# Patient Record
Sex: Male | Born: 1950 | Race: Black or African American | Hispanic: No | Marital: Single | Smoking: Former smoker
Health system: Southern US, Community
[De-identification: ages and names within clinical notes are randomized; demographics above are authoritative.]

## PROBLEM LIST (undated history)

## (undated) DIAGNOSIS — K430 Incisional hernia with obstruction, without gangrene: Secondary | ICD-10-CM

## (undated) DIAGNOSIS — G4730 Sleep apnea, unspecified: Secondary | ICD-10-CM

## (undated) DIAGNOSIS — I509 Heart failure, unspecified: Secondary | ICD-10-CM

## (undated) DIAGNOSIS — M109 Gout, unspecified: Secondary | ICD-10-CM

## (undated) DIAGNOSIS — K219 Gastro-esophageal reflux disease without esophagitis: Secondary | ICD-10-CM

## (undated) DIAGNOSIS — J449 Chronic obstructive pulmonary disease, unspecified: Secondary | ICD-10-CM

## (undated) DIAGNOSIS — I420 Dilated cardiomyopathy: Secondary | ICD-10-CM

## (undated) DIAGNOSIS — N189 Chronic kidney disease, unspecified: Secondary | ICD-10-CM

## (undated) DIAGNOSIS — J9600 Acute respiratory failure, unspecified whether with hypoxia or hypercapnia: Secondary | ICD-10-CM

## (undated) DIAGNOSIS — Z860100 Personal history of colon polyps, unspecified: Secondary | ICD-10-CM

## (undated) DIAGNOSIS — I10 Essential (primary) hypertension: Secondary | ICD-10-CM

## (undated) DIAGNOSIS — G9340 Encephalopathy, unspecified: Secondary | ICD-10-CM

## (undated) DIAGNOSIS — M1990 Unspecified osteoarthritis, unspecified site: Secondary | ICD-10-CM

## (undated) DIAGNOSIS — R6520 Severe sepsis without septic shock: Secondary | ICD-10-CM

## (undated) DIAGNOSIS — K56600 Partial intestinal obstruction, unspecified as to cause: Secondary | ICD-10-CM

## (undated) DIAGNOSIS — K56609 Unspecified intestinal obstruction, unspecified as to partial versus complete obstruction: Principal | ICD-10-CM

## (undated) DIAGNOSIS — B009 Herpesviral infection, unspecified: Secondary | ICD-10-CM

## (undated) DIAGNOSIS — I4892 Unspecified atrial flutter: Secondary | ICD-10-CM

## (undated) DIAGNOSIS — J189 Pneumonia, unspecified organism: Secondary | ICD-10-CM

## (undated) DIAGNOSIS — E119 Type 2 diabetes mellitus without complications: Secondary | ICD-10-CM

## (undated) DIAGNOSIS — A419 Sepsis, unspecified organism: Secondary | ICD-10-CM

## (undated) DIAGNOSIS — Z8719 Personal history of other diseases of the digestive system: Secondary | ICD-10-CM

## (undated) DIAGNOSIS — N390 Urinary tract infection, site not specified: Secondary | ICD-10-CM

## (undated) DIAGNOSIS — B029 Zoster without complications: Secondary | ICD-10-CM

## (undated) DIAGNOSIS — Z9889 Other specified postprocedural states: Secondary | ICD-10-CM

## (undated) DIAGNOSIS — A0472 Enterocolitis due to Clostridium difficile, not specified as recurrent: Secondary | ICD-10-CM

## (undated) DIAGNOSIS — N179 Acute kidney failure, unspecified: Secondary | ICD-10-CM

## (undated) HISTORY — PX: LAPAROSCOPIC INCISIONAL / UMBILICAL / VENTRAL HERNIA REPAIR: SUR789

## (undated) HISTORY — DX: Unspecified intestinal obstruction, unspecified as to partial versus complete obstruction: K56.609

## (undated) HISTORY — DX: Acute respiratory failure, unspecified whether with hypoxia or hypercapnia: J96.00

## (undated) HISTORY — DX: Gastro-esophageal reflux disease without esophagitis: K21.9

## (undated) HISTORY — DX: Chronic obstructive pulmonary disease, unspecified: J44.9

## (undated) HISTORY — DX: Sepsis, unspecified organism: A41.9

## (undated) HISTORY — DX: Chronic kidney disease, unspecified: N18.9

## (undated) HISTORY — DX: Unspecified atrial flutter: I48.92

## (undated) HISTORY — PX: HERNIA REPAIR: SHX51

## (undated) HISTORY — DX: Severe sepsis without septic shock: R65.20

---

## 2003-07-10 ENCOUNTER — Inpatient Hospital Stay (HOSPITAL_COMMUNITY): Admission: EM | Admit: 2003-07-10 | Discharge: 2003-07-17 | Payer: Self-pay | Admitting: *Deleted

## 2012-09-10 ENCOUNTER — Inpatient Hospital Stay (HOSPITAL_COMMUNITY)
Admission: EM | Admit: 2012-09-10 | Discharge: 2012-09-27 | DRG: 853 | Disposition: A | Discharge status: Skilled Nursing Facility | Payer: Medicaid Other | Attending: Internal Medicine | Admitting: Internal Medicine

## 2012-09-10 ENCOUNTER — Emergency Department (HOSPITAL_COMMUNITY): Payer: Medicaid Other

## 2012-09-10 ENCOUNTER — Encounter (HOSPITAL_COMMUNITY): Payer: Self-pay | Admitting: Adult Health

## 2012-09-10 DIAGNOSIS — I4729 Other ventricular tachycardia: Secondary | ICD-10-CM | POA: Diagnosis not present

## 2012-09-10 DIAGNOSIS — R197 Diarrhea, unspecified: Secondary | ICD-10-CM | POA: Diagnosis not present

## 2012-09-10 DIAGNOSIS — E872 Acidosis, unspecified: Secondary | ICD-10-CM | POA: Diagnosis not present

## 2012-09-10 DIAGNOSIS — L539 Erythematous condition, unspecified: Secondary | ICD-10-CM | POA: Diagnosis not present

## 2012-09-10 DIAGNOSIS — I472 Ventricular tachycardia, unspecified: Secondary | ICD-10-CM | POA: Diagnosis not present

## 2012-09-10 DIAGNOSIS — E46 Unspecified protein-calorie malnutrition: Secondary | ICD-10-CM | POA: Diagnosis not present

## 2012-09-10 DIAGNOSIS — E662 Morbid (severe) obesity with alveolar hypoventilation: Secondary | ICD-10-CM | POA: Diagnosis present

## 2012-09-10 DIAGNOSIS — I509 Heart failure, unspecified: Secondary | ICD-10-CM | POA: Diagnosis present

## 2012-09-10 DIAGNOSIS — N189 Chronic kidney disease, unspecified: Secondary | ICD-10-CM | POA: Diagnosis present

## 2012-09-10 DIAGNOSIS — I5043 Acute on chronic combined systolic (congestive) and diastolic (congestive) heart failure: Secondary | ICD-10-CM | POA: Diagnosis present

## 2012-09-10 DIAGNOSIS — I10 Essential (primary) hypertension: Secondary | ICD-10-CM

## 2012-09-10 DIAGNOSIS — Z7982 Long term (current) use of aspirin: Secondary | ICD-10-CM

## 2012-09-10 DIAGNOSIS — IMO0002 Reserved for concepts with insufficient information to code with codable children: Secondary | ICD-10-CM | POA: Diagnosis not present

## 2012-09-10 DIAGNOSIS — I959 Hypotension, unspecified: Secondary | ICD-10-CM

## 2012-09-10 DIAGNOSIS — I5040 Unspecified combined systolic (congestive) and diastolic (congestive) heart failure: Secondary | ICD-10-CM

## 2012-09-10 DIAGNOSIS — L03116 Cellulitis of left lower limb: Secondary | ICD-10-CM

## 2012-09-10 DIAGNOSIS — M79609 Pain in unspecified limb: Secondary | ICD-10-CM

## 2012-09-10 DIAGNOSIS — L8992 Pressure ulcer of unspecified site, stage 2: Secondary | ICD-10-CM | POA: Diagnosis present

## 2012-09-10 DIAGNOSIS — I119 Hypertensive heart disease without heart failure: Secondary | ICD-10-CM | POA: Diagnosis present

## 2012-09-10 DIAGNOSIS — Z6834 Body mass index (BMI) 34.0-34.9, adult: Secondary | ICD-10-CM

## 2012-09-10 DIAGNOSIS — G4733 Obstructive sleep apnea (adult) (pediatric): Secondary | ICD-10-CM | POA: Diagnosis present

## 2012-09-10 DIAGNOSIS — L02419 Cutaneous abscess of limb, unspecified: Secondary | ICD-10-CM | POA: Diagnosis present

## 2012-09-10 DIAGNOSIS — R609 Edema, unspecified: Secondary | ICD-10-CM | POA: Diagnosis not present

## 2012-09-10 DIAGNOSIS — R748 Abnormal levels of other serum enzymes: Secondary | ICD-10-CM | POA: Diagnosis present

## 2012-09-10 DIAGNOSIS — A419 Sepsis, unspecified organism: Secondary | ICD-10-CM | POA: Diagnosis present

## 2012-09-10 DIAGNOSIS — D696 Thrombocytopenia, unspecified: Secondary | ICD-10-CM | POA: Diagnosis present

## 2012-09-10 DIAGNOSIS — R579 Shock, unspecified: Secondary | ICD-10-CM | POA: Diagnosis present

## 2012-09-10 DIAGNOSIS — E669 Obesity, unspecified: Secondary | ICD-10-CM | POA: Diagnosis present

## 2012-09-10 DIAGNOSIS — R0689 Other abnormalities of breathing: Secondary | ICD-10-CM | POA: Diagnosis present

## 2012-09-10 DIAGNOSIS — E870 Hyperosmolality and hypernatremia: Secondary | ICD-10-CM | POA: Diagnosis not present

## 2012-09-10 DIAGNOSIS — R0601 Orthopnea: Secondary | ICD-10-CM | POA: Diagnosis present

## 2012-09-10 DIAGNOSIS — N179 Acute kidney failure, unspecified: Secondary | ICD-10-CM | POA: Diagnosis present

## 2012-09-10 DIAGNOSIS — E119 Type 2 diabetes mellitus without complications: Secondary | ICD-10-CM | POA: Diagnosis present

## 2012-09-10 DIAGNOSIS — R570 Cardiogenic shock: Secondary | ICD-10-CM | POA: Diagnosis not present

## 2012-09-10 DIAGNOSIS — Z9119 Patient's noncompliance with other medical treatment and regimen: Secondary | ICD-10-CM

## 2012-09-10 DIAGNOSIS — G9340 Encephalopathy, unspecified: Secondary | ICD-10-CM | POA: Diagnosis not present

## 2012-09-10 DIAGNOSIS — J9601 Acute respiratory failure with hypoxia: Secondary | ICD-10-CM | POA: Diagnosis present

## 2012-09-10 DIAGNOSIS — M7989 Other specified soft tissue disorders: Secondary | ICD-10-CM

## 2012-09-10 DIAGNOSIS — Z91199 Patient's noncompliance with other medical treatment and regimen due to unspecified reason: Secondary | ICD-10-CM

## 2012-09-10 DIAGNOSIS — F17200 Nicotine dependence, unspecified, uncomplicated: Secondary | ICD-10-CM | POA: Diagnosis present

## 2012-09-10 DIAGNOSIS — Z8679 Personal history of other diseases of the circulatory system: Secondary | ICD-10-CM | POA: Diagnosis not present

## 2012-09-10 DIAGNOSIS — I428 Other cardiomyopathies: Secondary | ICD-10-CM | POA: Diagnosis present

## 2012-09-10 DIAGNOSIS — I4510 Unspecified right bundle-branch block: Secondary | ICD-10-CM | POA: Diagnosis present

## 2012-09-10 DIAGNOSIS — L03119 Cellulitis of unspecified part of limb: Secondary | ICD-10-CM | POA: Diagnosis present

## 2012-09-10 DIAGNOSIS — I129 Hypertensive chronic kidney disease with stage 1 through stage 4 chronic kidney disease, or unspecified chronic kidney disease: Secondary | ICD-10-CM | POA: Diagnosis present

## 2012-09-10 DIAGNOSIS — R600 Localized edema: Secondary | ICD-10-CM | POA: Diagnosis not present

## 2012-09-10 DIAGNOSIS — E876 Hypokalemia: Secondary | ICD-10-CM | POA: Diagnosis not present

## 2012-09-10 DIAGNOSIS — L89309 Pressure ulcer of unspecified buttock, unspecified stage: Secondary | ICD-10-CM | POA: Diagnosis present

## 2012-09-10 DIAGNOSIS — IMO0001 Reserved for inherently not codable concepts without codable children: Secondary | ICD-10-CM | POA: Diagnosis present

## 2012-09-10 DIAGNOSIS — J9600 Acute respiratory failure, unspecified whether with hypoxia or hypercapnia: Secondary | ICD-10-CM | POA: Diagnosis not present

## 2012-09-10 HISTORY — DX: Type 2 diabetes mellitus without complications: E11.9

## 2012-09-10 HISTORY — DX: Dilated cardiomyopathy: I42.0

## 2012-09-10 HISTORY — DX: Essential (primary) hypertension: I10

## 2012-09-10 HISTORY — DX: Heart failure, unspecified: I50.9

## 2012-09-10 LAB — CBC WITH DIFFERENTIAL/PLATELET
Basophils Relative: 0 % (ref 0–1)
Eosinophils Relative: 0 % (ref 0–5)
Lymphocytes Relative: 13 % (ref 12–46)
Monocytes Relative: 6 % (ref 3–12)
Neutrophils Relative %: 81 % — ABNORMAL HIGH (ref 43–77)
Platelets: 52 10*3/uL — ABNORMAL LOW (ref 150–400)
RBC: 6.23 MIL/uL — ABNORMAL HIGH (ref 4.22–5.81)

## 2012-09-10 LAB — BASIC METABOLIC PANEL
CO2: 28 mEq/L (ref 19–32)
Chloride: 97 mEq/L (ref 96–112)
GFR calc Af Amer: 68 mL/min — ABNORMAL LOW (ref >90)
Glucose, Bld: 107 mg/dL — ABNORMAL HIGH (ref 70–99)
Potassium: 3.7 mEq/L (ref 3.5–5.1)
Sodium: 140 mEq/L (ref 135–145)

## 2012-09-10 MED ORDER — TIOTROPIUM BROMIDE MONOHYDRATE 18 MCG IN CAPS
18.0000 ug | ORAL_CAPSULE | Freq: Every day | RESPIRATORY_TRACT | Status: DC
  Administered 2012-09-11: 18 ug via RESPIRATORY_TRACT
  Filled 2012-09-10: qty 5

## 2012-09-10 MED ORDER — CLINDAMYCIN PHOSPHATE 900 MG/50ML IV SOLN
900.0000 mg | Freq: Three times a day (TID) | INTRAVENOUS | Status: DC
  Administered 2012-09-11 – 2012-09-14 (×11): 900 mg via INTRAVENOUS
  Filled 2012-09-10 (×14): qty 50

## 2012-09-10 MED ORDER — FUROSEMIDE 10 MG/ML IJ SOLN
80.0000 mg | INTRAMUSCULAR | Status: AC
  Administered 2012-09-10: 80 mg via INTRAVENOUS
  Filled 2012-09-10: qty 8

## 2012-09-10 MED ORDER — ENOXAPARIN SODIUM 40 MG/0.4ML ~~LOC~~ SOLN
40.0000 mg | SUBCUTANEOUS | Status: DC
  Administered 2012-09-10: 40 mg via SUBCUTANEOUS
  Filled 2012-09-10 (×2): qty 0.4

## 2012-09-10 MED ORDER — HYDROMORPHONE HCL PF 1 MG/ML IJ SOLN: 1.0000 mg | INTRAMUSCULAR | Status: AC | PRN

## 2012-09-10 MED ORDER — LISINOPRIL 10 MG PO TABS
10.0000 mg | ORAL_TABLET | Freq: Every day | ORAL | Status: DC
  Administered 2012-09-10 – 2012-09-11 (×2): 10 mg via ORAL
  Filled 2012-09-10 (×2): qty 1

## 2012-09-10 MED ORDER — CLINDAMYCIN PHOSPHATE 900 MG/50ML IV SOLN
900.0000 mg | Freq: Once | INTRAVENOUS | Status: AC
  Administered 2012-09-10: 900 mg via INTRAVENOUS
  Filled 2012-09-10: qty 50

## 2012-09-10 MED ORDER — TORSEMIDE 100 MG PO TABS
100.0000 mg | ORAL_TABLET | Freq: Every day | ORAL | Status: DC
  Administered 2012-09-10 – 2012-09-11 (×2): 100 mg via ORAL
  Filled 2012-09-10 (×2): qty 1

## 2012-09-10 MED ORDER — CLINDAMYCIN PHOSPHATE 600 MG/50ML IV SOLN
600.0000 mg | Freq: Three times a day (TID) | INTRAVENOUS | Status: DC
  Filled 2012-09-10: qty 50

## 2012-09-10 MED ORDER — SODIUM CHLORIDE 0.9 % IJ SOLN: 3.0000 mL | Freq: Two times a day (BID) | INTRAMUSCULAR | Status: DC

## 2012-09-10 MED ORDER — HYDROCODONE-ACETAMINOPHEN 5-325 MG PO TABS
1.0000 | ORAL_TABLET | ORAL | Status: DC | PRN
  Administered 2012-09-11: 1 via ORAL
  Filled 2012-09-10: qty 1

## 2012-09-10 MED ORDER — ONDANSETRON HCL 4 MG/2ML IJ SOLN: 4.0000 mg | Freq: Four times a day (QID) | INTRAMUSCULAR | Status: DC | PRN

## 2012-09-10 MED ORDER — SODIUM CHLORIDE 0.9 % IV SOLN: 250.0000 mL | INTRAVENOUS | Status: DC | PRN

## 2012-09-10 MED ORDER — ONDANSETRON HCL 4 MG PO TABS: 4.0000 mg | ORAL_TABLET | Freq: Four times a day (QID) | ORAL | Status: DC | PRN

## 2012-09-10 MED ORDER — MOMETASONE FURO-FORMOTEROL FUM 100-5 MCG/ACT IN AERO
2.0000 | INHALATION_SPRAY | Freq: Every day | RESPIRATORY_TRACT | Status: DC
  Administered 2012-09-11: 2 via RESPIRATORY_TRACT
  Filled 2012-09-10: qty 8.8

## 2012-09-10 MED ORDER — ASPIRIN EC 81 MG PO TBEC
81.0000 mg | DELAYED_RELEASE_TABLET | Freq: Every day | ORAL | Status: DC
  Administered 2012-09-10 – 2012-09-11 (×2): 81 mg via ORAL
  Filled 2012-09-10 (×2): qty 1

## 2012-09-10 MED ORDER — POTASSIUM CHLORIDE CRYS ER 20 MEQ PO TBCR
40.0000 meq | EXTENDED_RELEASE_TABLET | Freq: Every day | ORAL | Status: DC
  Administered 2012-09-10 – 2012-09-11 (×2): 40 meq via ORAL
  Filled 2012-09-10 (×2): qty 2

## 2012-09-10 MED ORDER — ONDANSETRON HCL 4 MG/2ML IJ SOLN
4.0000 mg | Freq: Four times a day (QID) | INTRAMUSCULAR | Status: DC | PRN
  Administered 2012-09-11: 4 mg via INTRAVENOUS
  Filled 2012-09-10: qty 2

## 2012-09-10 MED ORDER — SODIUM CHLORIDE 0.9 % IJ SOLN: 3.0000 mL | INTRAMUSCULAR | Status: DC | PRN

## 2012-09-10 MED ORDER — CARVEDILOL 3.125 MG PO TABS
3.1250 mg | ORAL_TABLET | Freq: Two times a day (BID) | ORAL | Status: DC
  Administered 2012-09-11: 3.125 mg via ORAL
  Filled 2012-09-10 (×3): qty 1

## 2012-09-10 MED ORDER — SODIUM CHLORIDE 0.9 % IJ SOLN
3.0000 mL | Freq: Two times a day (BID) | INTRAMUSCULAR | Status: DC
  Administered 2012-09-11 – 2012-09-17 (×8): 3 mL via INTRAVENOUS

## 2012-09-10 MED ORDER — ONDANSETRON HCL 4 MG/2ML IJ SOLN: 4.0000 mg | Freq: Three times a day (TID) | INTRAMUSCULAR | Status: AC | PRN

## 2012-09-10 NOTE — ED Notes (Addendum)
Presents with left leg weeping edema, redness, hot to touch and pain. Pt reports that he has scraped his leg on his bed multiple times and the edema, redness and pain began Thursday and has progressively worsened. Denies fevers. Erythema and edema extends from hip to foot. Unable to palpate pedal pulse due to edema. Pt able to wiggle digits.

## 2012-09-10 NOTE — H&P (Signed)
PCP:   Pcp Not In System   Chief Complaint:  Left leg swollen and red and painful  HPI: 62 yo male h/o chf, htn, dm comes in with worsening redness, swelling and pain to left lower extremity.  Pt states that he just bought a new twin sized bed, which is too small from him and last Thursday when he was trying to get out of the bed he scraped his left anterior chin against the metal on the bed.  Within 24 hours the scraped area started to become red and this has worsened daily to the point that the redness is covered the whole lower part of the leg leg and up to his left thigh now.  It has been swelling also and starting to weep.  Very painful to walk.  Denies fevers.  No n/v.  No sob or cp.  His right leg is not swollen.  No recent abx use.    Review of Systems:  Positive and negative as per HPI otherwise all other systems are negative  Past Medical History: Past Medical History  Diagnosis Date  . Diabetes mellitus without complication   . CHF (congestive heart failure)   . Hypertension    History reviewed. No pertinent past surgical history.  Medications: Prior to Admission medications   Medication Sig Start Date End Date Taking? Authorizing Provider  aspirin EC 81 MG tablet Take 81 mg by mouth daily.   Yes Historical Provider, MD  carvedilol (COREG) 3.125 MG tablet Take 3.125 mg by mouth 2 (two) times daily with a meal.   Yes Historical Provider, MD  lisinopril (PRINIVIL,ZESTRIL) 10 MG tablet Take 10 mg by mouth daily.   Yes Historical Provider, MD  mometasone-formoterol (DULERA) 100-5 MCG/ACT AERO Inhale 2 puffs into the lungs daily.   Yes Historical Provider, MD  naproxen sodium (ANAPROX) 220 MG tablet Take 440 mg by mouth daily as needed. For pain   Yes Historical Provider, MD  potassium chloride SA (K-DUR,KLOR-CON) 20 MEQ tablet Take 40 mEq by mouth daily.   Yes Historical Provider, MD  tiotropium (SPIRIVA) 18 MCG inhalation capsule Place 18 mcg into inhaler and inhale daily.    Yes Historical Provider, MD  torsemide (DEMADEX) 100 MG tablet Take 100 mg by mouth daily.   Yes Historical Provider, MD    Allergies:  No Known Allergies  Social History:  reports that he has been smoking Cigarettes.  He has been smoking about 0.00 packs per day. He does not have any smokeless tobacco history on file. He reports that he does not drink alcohol or use illicit drugs.  Family History: History reviewed. No pertinent family history.  Physical Exam: Filed Vitals:   09/10/12 1704 09/10/12 1949  BP: 112/90 106/79  Pulse: 80 86  Temp: 97.6 F (36.4 C)   TempSrc: Oral   Resp: 18 18  Height: 6\' 1"  (1.854 m)   Weight: 122.925 kg (271 lb)   SpO2: 95% 97%   General appearance: alert, cooperative and no distress Head: Normocephalic, without obvious abnormality, atraumatic Eyes: negative Nose: Nares normal. Septum midline. Mucosa normal. No drainage or sinus tenderness. Neck: no JVD and supple, symmetrical, trachea midline Lungs: clear to auscultation bilaterally Heart: regular rate and rhythm, S1, S2 normal, no murmur, click, rub or gallop Abdomen: soft, non-tender; bowel sounds normal; no masses,  no organomegaly Extremities: edema 2+ edema to lle with erythema to most of lower leg up to left thigh c/w cellulitis with open wound to left anterior chin  area pulses intact area marked out with marker on admission Pulses: 2+ and symmetric Skin: Skin color, texture, turgor normal. No rashes or lesions other than changes to lle mentioned above Neurologic: Grossly normal   Labs on Admission:   Recent Labs  09/10/12 1710  NA 140  K 3.7  CL 97  CO2 28  GLUCOSE 107*  BUN 40*  CREATININE 1.29  CALCIUM 8.7    Recent Labs  09/10/12 1710  WBC 7.2  NEUTROABS 5.9  HGB 16.5  HCT 48.7  MCV 78.2  PLT 52*    Radiological Exams on Admission: Dg Chest 2 View  09/10/2012   *RADIOLOGY REPORT*  Clinical Data: Left leg edema  CHEST - 2 VIEW  Comparison: 07/10/2003   Findings: Marked cardiac enlargement.  Vascular congestion with mild edema.  Left lower lobe atelectasis.  Small pleural effusions.  IMPRESSION: Congestive heart failure with mild edema.   Original Report Authenticated By: Janeece Riggers, M.D.    Assessment/Plan 62 yo male with significant lle cellulitis/swelling  Principal Problem:   Cellulitis of extremity Active Problems:   CHF (congestive heart failure)   Hypertension   Diabetes mellitus without complication  Place on iv clinda.  Elevate leg.  Ck tetanus status.  chf is clinically compensated at this time.  Admit to tele full code.  Teja Judice A 09/10/2012, 8:26 PM

## 2012-09-10 NOTE — Progress Notes (Signed)
Pt admitted to 4E23 from ER came by stretcher, no family member at the bedside. Pt c/o leg tenderness related to redness and swollen of left leg. VSS, denies any pain, SOB or discomfort at this time, no distress noticed. Pt refuses to have CHF video presented today. CHF education started pt oriented about HF zone, booklet given, pt oriented of daily weight and intake and output monitor. Pt encouraged to call for assistance to get OOB to prevent any fall or injury while in the hospital.

## 2012-09-10 NOTE — ED Provider Notes (Signed)
History    CSN: [id number] Arrival date & time 09/10/12  1651  First MD Initiated Contact with Patient 09/10/12 1814     Chief Complaint  Patient presents with  . Leg Swelling   (Consider location/radiation/quality/duration/timing/severity/associated sxs/prior Treatment) HPI Comments: Patient is a 62 year old male with a past medical history of diabetes, CHF, and hypertension who presents with a 2 day history of left leg pain. The pain started gradually and progressively worsened since the onset. The pain is aching and severe with radiation up to his left hip. Palpation and movement makes the pain worse. No alleviating factors. Patient reports associated redness, weeping and hot to touch. Patient did not try anything for symptoms. Patient just moved here from [facility] and does not see any physicians in [place].   Past Medical History  Diagnosis Date  . Diabetes mellitus without complication   . CHF (congestive heart failure)   . Hypertension    History reviewed. No pertinent past surgical history. History reviewed. No pertinent family history. History  Substance Use Topics  . Smoking status: Current Every Day Smoker    Types: Cigarettes  . Smokeless tobacco: Not on file  . Alcohol Use: No    Review of Systems  Cardiovascular: Positive for leg swelling.  Musculoskeletal: Positive for myalgias.  Skin: Positive for color change.  All other systems reviewed and are negative.    Allergies  Review of patient's allergies indicates not on file.  Home Medications  No current outpatient prescriptions on file. BP 112/90  Pulse 80  Temp(Src) 97.6 F (36.4 C) (Oral)  Resp 18  Ht 6\' 1"  (1.854 m)  Wt 271 lb (122.925 kg)  BMI 35.76 kg/m2  SpO2 95% Physical Exam  Nursing note and vitals reviewed. Constitutional: He is oriented to person, place, and time. He appears well-developed and well-nourished. No distress.  HENT:  Head: Normocephalic and atraumatic.  Eyes:  Conjunctivae are normal.  Neck: Normal range of motion.  Cardiovascular: Normal rate and regular rhythm.  Exam reveals no gallop and no friction rub.   No murmur heard. Pulmonary/Chest: Effort normal and breath sounds normal. He has no wheezes. He has no rales. He exhibits no tenderness.  Abdominal: Soft. He exhibits no distension. There is no tenderness. There is no rebound and no guarding.  Musculoskeletal: He exhibits edema.  Pitting edema of bilateral lower extremities L>R. Tenderness to palpation and warm to touch of left lower extremity. Erythema and weeping of left lower extremity.   Neurological: He is alert and oriented to person, place, and time. Coordination normal.  Speech is goal-oriented. Moves limbs without ataxia.   Skin: Skin is warm and dry.  Generalized erythema and weeping of left lower extremity.   Psychiatric: He has a normal mood and affect. His behavior is normal.    ED Course  Procedures (including critical care time) Labs Reviewed  CBC WITH DIFFERENTIAL - Abnormal; Notable for the following:    RBC 6.23 (*)    RDW 22.0 (*)    Platelets 52 (*)    Neutrophils Relative % 81 (*)    All other components within normal limits  BASIC METABOLIC PANEL - Abnormal; Notable for the following:    Glucose, Bld 107 (*)    BUN 40 (*)    GFR calc non Af Amer 58 (*)    GFR calc Af Amer 68 (*)    All other components within normal limits  PRO B NATRIURETIC PEPTIDE - Abnormal; Notable for the  following:    Pro B Natriuretic peptide (BNP) 23656.0 (*)    All other components within normal limits   Dg Chest 2 View  09/10/2012   *RADIOLOGY REPORT*  Clinical Data: Left leg edema  CHEST - 2 VIEW  Comparison: 07/10/2003  Findings: Marked cardiac enlargement.  Vascular congestion with mild edema.  Left lower lobe atelectasis.  Small pleural effusions.  IMPRESSION: Congestive heart failure with mild edema.   Original Report Authenticated By: Janeece Riggers, M.D.   1. CHF (congestive  heart failure), acute on chronic, combined   2. Cellulitis of left lower leg     MDM  6:29 PM Labs, chest xray and DVT study pending. Vitals stable and patient afebrile.  7:40 PM Patient has elevated BNP with vascular congestion noted on chest xray. Patient will have 80mg  IV lasix and be admitted for CHF and cellulitis of lower extremity. Patient will have IV clindamycin for cellulitis.   Emilia Beck, PA-C 09/10/12 2025

## 2012-09-10 NOTE — Progress Notes (Signed)
VASCULAR LAB PRELIMINARY  PRELIMINARY  PRELIMINARY  PRELIMINARY  Left lower extremity venous duplex completed.    Preliminary report:  Left:  No evidence of DVT, superficial thrombosis, or Baker's cyst. Enlargement of the inguinal lymph nodes noted  Brandice Busser, RVS 09/10/2012, 8:40 PM

## 2012-09-10 NOTE — Progress Notes (Signed)
ANTIBIOTIC CONSULT NOTE - INITIAL  Pharmacy Consult for Clindamycin Indication: Cellulitis  Allergies  Allergen Reactions  . Other Nausea And Vomiting    Patient stated drug is "kerein"    Patient Measurements: Height: 6\' 1"  (185.4 cm) Weight: 261 lb 7.5 oz (118.6 kg) (Bedscale, Pt unable to stand) IBW/kg (Calculated) : 79.9 Adjusted Body Weight:   Vital Signs: Temp: 97.3 F (36.3 C) (07/11 2145) Temp src: Oral (07/11 2145) BP: 89/67 mmHg (07/11 2145) Pulse Rate: 92 (07/11 2145) Intake/Output from previous day:   Intake/Output from this shift: Total I/O In: -  Out: 300 [Urine:300]  Labs:  Recent Labs  09/10/12 1710  WBC 7.2  HGB 16.5  PLT 52*  CREATININE 1.29   Estimated Creatinine Clearance: 81.1 ml/min (by C-G formula based on Cr of 1.29). No results found for this basename: VANCOTROUGH, VANCOPEAK, VANCORANDOM, GENTTROUGH, GENTPEAK, GENTRANDOM, TOBRATROUGH, TOBRAPEAK, TOBRARND, AMIKACINPEAK, AMIKACINTROU, AMIKACIN,  in the last 72 hours   Microbiology: No results found for this or any previous visit (from the past 720 hour(s)).  Medical History: Past Medical History  Diagnosis Date  . Diabetes mellitus without complication   . CHF (congestive heart failure)   . Hypertension     Medications:  Scheduled:  . aspirin EC  81 mg Oral Daily  . [START ON 09/11/2012] carvedilol  3.125 mg Oral BID WC  . [START ON 09/11/2012] clindamycin (CLEOCIN) IV  600 mg Intravenous Q8H  . enoxaparin (LOVENOX) injection  40 mg Subcutaneous Q24H  . lisinopril  10 mg Oral Daily  . mometasone-formoterol  2 puff Inhalation Daily  . potassium chloride SA  40 mEq Oral Daily  . sodium chloride  3 mL Intravenous Q12H  . sodium chloride  3 mL Intravenous Q12H  . tiotropium  18 mcg Inhalation Daily  . torsemide  100 mg Oral Daily   Assessment: 62yo male with cellulitis of LLE after scraping it on metal bed.  Current dose of CLindamycin is 900mg  IV x 1 in ED, then 600mg  IV  q8  Goal of Therapy:  resolution of infectio  Plan:  Change Clindamycin to 900mg  IV q8   Marisue Humble, PharmD Clinical Pharmacist [facility]

## 2012-09-10 NOTE — ED Notes (Signed)
Admitting MD at bedside.

## 2012-09-11 DIAGNOSIS — D696 Thrombocytopenia, unspecified: Secondary | ICD-10-CM

## 2012-09-11 DIAGNOSIS — I509 Heart failure, unspecified: Secondary | ICD-10-CM | POA: Insufficient documentation

## 2012-09-11 DIAGNOSIS — I959 Hypotension, unspecified: Secondary | ICD-10-CM

## 2012-09-11 DIAGNOSIS — R197 Diarrhea, unspecified: Secondary | ICD-10-CM

## 2012-09-11 DIAGNOSIS — N179 Acute kidney failure, unspecified: Secondary | ICD-10-CM

## 2012-09-11 LAB — MAGNESIUM: Magnesium: 2 mg/dL (ref 1.5–2.5)

## 2012-09-11 LAB — CBC
Hemoglobin: 15.5 g/dL (ref 13.0–17.0)
Platelets: 46 10*3/uL — ABNORMAL LOW (ref 150–400)
Platelets: 48 10*3/uL — ABNORMAL LOW (ref 150–400)
RBC: 5.69 MIL/uL (ref 4.22–5.81)
RBC: 6.05 MIL/uL — ABNORMAL HIGH (ref 4.22–5.81)
WBC: 7.1 10*3/uL (ref 4.0–10.5)
WBC: 7.7 10*3/uL (ref 4.0–10.5)

## 2012-09-11 LAB — POCT I-STAT 3, ART BLOOD GAS (G3+)
Acid-Base Excess: 3 mmol/L — ABNORMAL HIGH (ref 0.0–2.0)
Bicarbonate: 33.2 mEq/L — ABNORMAL HIGH (ref 20.0–24.0)
O2 Saturation: 95 %
Patient temperature: 97.5
TCO2: 35 mmol/L (ref 0–100)
pH, Arterial: 7.28 — ABNORMAL LOW (ref 7.350–7.450)

## 2012-09-11 LAB — PROCALCITONIN: Procalcitonin: 2.56 ng/mL

## 2012-09-11 LAB — GLUCOSE, CAPILLARY
Glucose-Capillary: 117 mg/dL — ABNORMAL HIGH (ref 70–99)
Glucose-Capillary: 89 mg/dL (ref 70–99)

## 2012-09-11 LAB — BASIC METABOLIC PANEL
Calcium: 8.7 mg/dL (ref 8.4–10.5)
GFR calc Af Amer: 60 mL/min — ABNORMAL LOW (ref >90)
GFR calc non Af Amer: 51 mL/min — ABNORMAL LOW (ref >90)
Potassium: 3.8 mEq/L (ref 3.5–5.1)
Sodium: 141 mEq/L (ref 135–145)

## 2012-09-11 LAB — CREATININE, SERUM
Creatinine, Ser: 1.26 mg/dL (ref 0.50–1.35)
GFR calc Af Amer: 69 mL/min — ABNORMAL LOW (ref >90)

## 2012-09-11 MED ORDER — DOPAMINE-DEXTROSE 3.2-5 MG/ML-% IV SOLN
2.0000 ug/kg/min | INTRAVENOUS | Status: DC
  Administered 2012-09-11: 5 ug/kg/min via INTRAVENOUS

## 2012-09-11 MED ORDER — SODIUM CHLORIDE 0.9 % IV BOLUS (SEPSIS)
500.0000 mL | Freq: Once | INTRAVENOUS | Status: AC
  Administered 2012-09-11: 500 mL via INTRAVENOUS

## 2012-09-11 MED ORDER — VANCOMYCIN HCL 10 G IV SOLR
1250.0000 mg | Freq: Two times a day (BID) | INTRAVENOUS | Status: DC
  Administered 2012-09-12 – 2012-09-14 (×5): 1250 mg via INTRAVENOUS
  Filled 2012-09-11 (×6): qty 1250

## 2012-09-11 MED ORDER — DOPAMINE-DEXTROSE 3.2-5 MG/ML-% IV SOLN
INTRAVENOUS | Status: AC
  Filled 2012-09-11: qty 250

## 2012-09-11 MED ORDER — SODIUM CHLORIDE 0.9 % IV BOLUS (SEPSIS)
250.0000 mL | Freq: Once | INTRAVENOUS | Status: AC
  Administered 2012-09-11: 250 mL via INTRAVENOUS

## 2012-09-11 MED ORDER — VANCOMYCIN HCL 10 G IV SOLR
2500.0000 mg | INTRAVENOUS | Status: AC
  Administered 2012-09-11: 2500 mg via INTRAVENOUS
  Filled 2012-09-11: qty 2500

## 2012-09-11 MED ORDER — LOPERAMIDE HCL 2 MG PO CAPS
2.0000 mg | ORAL_CAPSULE | ORAL | Status: DC | PRN
  Administered 2012-09-11 (×2): 2 mg via ORAL
  Filled 2012-09-11 (×3): qty 1

## 2012-09-11 MED ORDER — SODIUM CHLORIDE 0.9 % IV SOLN: 250.0000 mL | INTRAVENOUS | Status: DC | PRN

## 2012-09-11 NOTE — Progress Notes (Signed)
Dr. Kendrick Fries aware of worsening mental status with pt. on Bipap, Anesthesia team notified for intubation per Dr. Kendrick Fries.  Corliss Skains A RN

## 2012-09-11 NOTE — Progress Notes (Signed)
eLink Physician-Brief Progress Note Patient Name: Caleb Bauer DOB: [birth year] MRN: [id number]  Date of Service  09/11/2012   HPI/Events of Note  Difficulty obtaining non invasive bp and O2 sat  eICU Interventions  resp to insert art line and obtain abg   Intervention Category Intermediate Interventions: Hypotension - evaluation and management  GIDDINGS, OLIVIA K. 09/11/2012, 10:24 PM

## 2012-09-11 NOTE — Progress Notes (Signed)
Bp still low. 250CC bolus given as per dr Donna Bernard with slight increse in bp after i hr

## 2012-09-11 NOTE — Consult Note (Signed)
Reason for Consult:left leg swelling/bruising Referring Physician: TRH  HPI: Caleb Bauer is an 62 y.o. male admitted with presumptive LLE cellulitis after hitting leg against his bed last Thursday (9 days ago), noted swelling and redness with progressive worsening. Denies hip or thigh trauma. Hx CHF.   Past Medical History  Diagnosis Date  . Diabetes mellitus without complication   . CHF (congestive heart failure)   . Hypertension     History reviewed. No pertinent past surgical history.  History reviewed. No pertinent family history.  Social History:  reports that he has been smoking Cigarettes.  He has a .125 pack-year smoking history. He does not have any smokeless tobacco history on file. He reports that he does not drink alcohol or use illicit drugs.  Allergies:  Allergies  Allergen Reactions  . Other Nausea And Vomiting    Patient stated drug is "kerein"    Medications: meds reviewed, on ASA but no other chronic anticoagulant. On lovenox since admission.  Results for orders placed during the hospital encounter of 09/10/12 (from the past 48 hour(s))  CBC WITH DIFFERENTIAL     Status: Abnormal   Collection Time    09/10/12  5:10 PM      Result Value Range   WBC 7.2  4.0 - 10.5 K/uL   RBC 6.23 (*) 4.22 - 5.81 MIL/uL   Hemoglobin 16.5  13.0 - 17.0 g/dL   HCT 91.4  78.2 - 95.6 %   MCV 78.2  78.0 - 100.0 fL   MCH 26.5  26.0 - 34.0 pg   MCHC 33.9  30.0 - 36.0 g/dL   RDW 21.3 (*) 08.6 - 57.8 %   Platelets 52 (*) 150 - 400 K/uL   Comment: SPECIMEN CHECKED FOR CLOTS     REPEATED TO VERIFY     PLATELET COUNT CONFIRMED BY SMEAR     LARGE PLATELETS PRESENT   Neutrophils Relative % 81 (*) 43 - 77 %   Lymphocytes Relative 13  12 - 46 %   Monocytes Relative 6  3 - 12 %   Eosinophils Relative 0  0 - 5 %   Basophils Relative 0  0 - 1 %   Neutro Abs 5.9  1.7 - 7.7 K/uL   Lymphs Abs 0.9  0.7 - 4.0 K/uL   Monocytes Absolute 0.4  0.1 - 1.0 K/uL   Eosinophils Absolute 0.0  0.0  - 0.7 K/uL   Basophils Absolute 0.0  0.0 - 0.1 K/uL   RBC Morphology POLYCHROMASIA PRESENT     Comment: BURR CELLS  BASIC METABOLIC PANEL     Status: Abnormal   Collection Time    09/10/12  5:10 PM      Result Value Range   Sodium 140  135 - 145 mEq/L   Potassium 3.7  3.5 - 5.1 mEq/L   Chloride 97  96 - 112 mEq/L   CO2 28  19 - 32 mEq/L   Glucose, Bld 107 (*) 70 - 99 mg/dL   BUN 40 (*) 6 - 23 mg/dL   Creatinine, Ser 4.69  0.50 - 1.35 mg/dL   Calcium 8.7  8.4 - 62.9 mg/dL   GFR calc non Af Amer 58 (*) >90 mL/min   GFR calc Af Amer 68 (*) >90 mL/min   Comment:            The eGFR has been calculated     using the CKD EPI equation.     This calculation has  not been     validated in all clinical     situations.     eGFR's persistently     <90 mL/min signify     possible Chronic Kidney Disease.  PRO B NATRIURETIC PEPTIDE     Status: Abnormal   Collection Time    09/10/12  5:10 PM      Result Value Range   Pro B Natriuretic peptide (BNP) 23656.0 (*) 0 - 125 pg/mL  GLUCOSE, CAPILLARY     Status: Abnormal   Collection Time    09/10/12 10:10 PM      Result Value Range   Glucose-Capillary 117 (*) 70 - 99 mg/dL  CBC     Status: Abnormal   Collection Time    09/10/12 11:52 PM      Result Value Range   WBC 7.1  4.0 - 10.5 K/uL   RBC 6.05 (*) 4.22 - 5.81 MIL/uL   Hemoglobin 15.5  13.0 - 17.0 g/dL   HCT 40.9  81.1 - 91.4 %   MCV 78.8  78.0 - 100.0 fL   MCH 25.6 (*) 26.0 - 34.0 pg   MCHC 32.5  30.0 - 36.0 g/dL   RDW 78.2 (*) 95.6 - 21.3 %   Platelets 46 (*) 150 - 400 K/uL   Comment: PLATELET COUNT CONFIRMED BY SMEAR  CREATININE, SERUM     Status: Abnormal   Collection Time    09/10/12 11:52 PM      Result Value Range   Creatinine, Ser 1.26  0.50 - 1.35 mg/dL   GFR calc non Af Amer 60 (*) >90 mL/min   GFR calc Af Amer 69 (*) >90 mL/min   Comment:            The eGFR has been calculated     using the CKD EPI equation.     This calculation has not been     validated in all  clinical     situations.     eGFR's persistently     <90 mL/min signify     possible Chronic Kidney Disease.  BASIC METABOLIC PANEL     Status: Abnormal   Collection Time    09/11/12  5:10 AM      Result Value Range   Sodium 141  135 - 145 mEq/L   Potassium 3.8  3.5 - 5.1 mEq/L   Chloride 97  96 - 112 mEq/L   CO2 30  19 - 32 mEq/L   Glucose, Bld 120 (*) 70 - 99 mg/dL   BUN 43 (*) 6 - 23 mg/dL   Creatinine, Ser 0.86 (*) 0.50 - 1.35 mg/dL   Calcium 8.7  8.4 - 57.8 mg/dL   GFR calc non Af Amer 51 (*) >90 mL/min   GFR calc Af Amer 60 (*) >90 mL/min   Comment:            The eGFR has been calculated     using the CKD EPI equation.     This calculation has not been     validated in all clinical     situations.     eGFR's persistently     <90 mL/min signify     possible Chronic Kidney Disease.  CBC     Status: Abnormal   Collection Time    09/11/12  5:10 AM      Result Value Range   WBC 7.7  4.0 - 10.5 K/uL   RBC 5.69  4.22 - 5.81 MIL/uL  Hemoglobin 15.1  13.0 - 17.0 g/dL   HCT 30.8  65.7 - 84.6 %   MCV 78.0  78.0 - 100.0 fL   MCH 26.5  26.0 - 34.0 pg   MCHC 34.0  30.0 - 36.0 g/dL   RDW 96.2 (*) 95.2 - 84.1 %   Platelets 48 (*) 150 - 400 K/uL   Comment: CONSISTENT WITH PREVIOUS RESULT  MAGNESIUM     Status: None   Collection Time    09/11/12  5:45 AM      Result Value Range   Magnesium 2.0  1.5 - 2.5 mg/dL  GLUCOSE, CAPILLARY     Status: Abnormal   Collection Time    09/11/12  6:41 AM      Result Value Range   Glucose-Capillary 110 (*) 70 - 99 mg/dL   Comment 1 Notify RN    CLOSTRIDIUM DIFFICILE BY PCR     Status: None   Collection Time    09/11/12 11:13 AM      Result Value Range   C difficile by pcr NEGATIVE  NEGATIVE    Dg Chest 2 View  09/10/2012   *RADIOLOGY REPORT*  Clinical Data: Left leg edema  CHEST - 2 VIEW  Comparison: 07/10/2003  Findings: Marked cardiac enlargement.  Vascular congestion with mild edema.  Left lower lobe atelectasis.  Small pleural  effusions.  IMPRESSION: Congestive heart failure with mild edema.   Original Report Authenticated By: Janeece Riggers, M.D.     Vitals Temp:  [97.3 F (36.3 C)-97.8 F (36.6 C)] 97.8 F (36.6 C) (07/12 1355) Pulse Rate:  [67-92] 67 (07/12 1355) Resp:  [18] 18 (07/12 1355) BP: (77-106)/(50-79) 86/58 mmHg (07/12 1652) SpO2:  [92 %-97 %] 93 % (07/12 1355) Weight:  [118.6 kg (261 lb 7.5 oz)-122.6 kg (270 lb 4.5 oz)] 122.6 kg (270 lb 4.5 oz) (07/12 0534) Body mass index is 35.67 kg/(m^2).  Physical Exam: Obese BM, somnolent arousable and minimally cooperative. 3+ edema LLE, 1+ RLE. 1+ DP pulse LLE. Circumferential ecchymosis left leg knee to ankle, superficial skin creases returning suggesting overall swelling is improving. No fluctuance or drainage. Compartments soft, dorsi and plantar flexors intact with minimal pain on passive motion. Broad ecchymosis left anterior thigh and hip. Thigh compartment soft. Fair effort with assisted SLR.  Doppler LLE no DVT, enlarged left inguinal lymph nodes, other changes consistent with fluid overload.     Assessment/Plan: Impression:  Marked ecchymosis and swelling LLE. Current exam dose not suggest acute compartment syndrome. Additional Ecchymosis hip and thigh in face of thrombocytopenia concerning for coagulopathy.  Treatment: discussed with Dr. Donna Bernard. Encouraged calf pumps, elevation and other edema reduction techniques.  Aedin Jeansonne M 09/11/2012, 5:36 PM

## 2012-09-11 NOTE — Progress Notes (Signed)
Bp 75/57 after second 250cc bolus. Dr Donna Bernard made aware. Awaiting orders

## 2012-09-11 NOTE — Procedures (Signed)
Arterial Catheter Insertion Procedure Note Caleb Bauer [id number] [birth year]  Procedure: Insertion of Arterial Catheter  Indications: Blood pressure monitoring  Procedure Details Consent: Unable to obtain consent because of altered level of consciousness. Time Out: Verified patient identification, verified procedure, site/side was marked, verified correct patient position, special equipment/implants available, medications/allergies/relevent history reviewed, required imaging and test results available.  Performed  Maximum sterile technique was used including cap, gloves, gown, hand hygiene, mask and sheet. Skin prep: Chlorhexidine; local anesthetic administered 20 gauge catheter was inserted into right radial artery using the Seldinger technique.  Evaluation Blood flow good; BP tracing good. Complications: No apparent complications.  RT placed arterial catheter into pt. Right radial artery. Procedure was done per MD order and per protocol. No apparent complications.   Caleb Bauer 09/11/2012

## 2012-09-11 NOTE — Progress Notes (Signed)
eLink Physician-Brief Progress Note Patient Name: Caleb Bauer DOB: 10/05/1950 MRN: [id number]  Date of Service  09/11/2012   HPI/Events of Note   Worsening mental status despite BIPAP; critically ill  eICU Interventions  Needs intubation Called anesthesia as fellow at [facility], Darl Kuss 09/11/2012, 11:39 PM

## 2012-09-11 NOTE — Progress Notes (Signed)
eLink Physician-Brief Progress Note Patient Name: Caleb Bauer DOB: February 10, 1951 MRN: [id number]  Date of Service  09/11/2012   HPI/Events of Note   Pt with chf and cellulitis, hypotensive, moved from 4east  eICU Interventions  Persistent hypotension despite fluid bolus, lactate 4, procalcitonin 2.6. Started peripheral dopamine pending central line placement.    Intervention Category Major Interventions: Hypotension - evaluation and management Intermediate Interventions: Hypotension - evaluation and management  GIDDINGS, OLIVIA K. 09/11/2012, 10:25 PM

## 2012-09-11 NOTE — Progress Notes (Signed)
Patient became lethargic, Sat's in the 88-89 Spo2 with NRB. Dr. Belinda Block notified ABG ordered and Aline placement ordered. Will continue to monitor.  Corliss Skains RN

## 2012-09-11 NOTE — Progress Notes (Addendum)
[facility] PROGRESS NOTE  Caleb Bauer BJY:[id number] DOB: 1950-05-18 DOA: 09/10/2012 PCP: Pcp Not In System ADDENDUM: 7:00PM . Hypotension -After completing second 250cc bolus BP is 77/57 per nsg -all antihypertensives dc'ed as below -obtain blood cultures, and add vanc -His BNP is >23000, I think he will likely need vasopressors -I have consulted CCM- spoke with Dr Pollie Friar and she recommends giving another 500cc of IVF, obtaining procalcitonin as well and agrees with lactic level and transfer to ICU-2900  .Discussed pt with ortho- Dr Rennis Chris and he states he likely does not have compartment syndrome    Assessment/Plan: Present on Admission:  . Diarrhea -obtain C.diff studies and follow -likely abx associated . Presumed Cellulitis of extremity -? Compartment syndrome given circumferential distribution and h/o trauma - ortho consult for further recs -doppler US neg for DVT - continue empiric clindaamycin . Hypotension, unspecified -possibly secondary to diarrhea and torsemide +antihypertensive meds this am vs infection/sepsis due to above -will give 250 cc bolus recheck and repeat if still hypotensive- cautious given CHF history with BNP >23000 -hold further diuretics and antihypertensives and follow . CHF (congestive heart failure) -clinically appears compensated at [facility] time but BNP markedly elevated- no EF available on epic, follow -strict I&Os, close monitoring of fluid status -hold diurtetic for now secondary to diarrhea and hypotension, follow and resume when appropriate. . Diabetes mellitus without complication -on no meds, obtain Hgb A1C and follow . h/o Hypertension - holding lisinopril and coreg due to hypotension as above .Thrombocytopemia ? Secondary to infection - will D/C lovenox and ASA for now follow recheck and further treat accordingly -empiric abx as above   Code Status: full Family Communication: directly with pt at bedside Disposition Plan:  pending clinical course   Consultants:  Ortho  CCM eval pending  Procedures:  LE doppler - neg for DVT  Antibiotics:  clindamycin  HPI/Subjective: Pt sleepy but easily aroused. Denies CP and no SOB. Per nsg diarrhea x4 so far this am  Objective: Filed Vitals:   09/11/12 0843 09/11/12 1042 09/11/12 1355 09/11/12 1445  BP:  102/55 77/52 80/50   Pulse:   67   Temp:   97.8 F (36.6 C)   TempSrc:   Oral   Resp:   18   Height:      Weight:      SpO2: 95%  93%     Intake/Output Summary (Last 24 hours) at 09/11/12 1519 Last data filed at 09/11/12 1354  Gross per 24 hour  Intake   1182 ml  Output    700 ml  Net    482 ml   Filed Weights   09/10/12 1704 09/10/12 2145 09/11/12 0534  Weight: 122.925 kg (271 lb) 118.6 kg (261 lb 7.5 oz) 122.6 kg (270 lb 4.5 oz)   Exam:  General: sleepy but easily aroused and oriented x3 & oriented x  In NAD Cardiovascular: RRR, nl S1 s2 Respiratory: decreased BS at bases with no crackles or wheezes, resp non labored Abdomen: soft +BS NT/ND, no masses palpable Extremities:  LLE with circumferential ecchymosis from ankle up to knee and small open area anteromedially that is draining- serous, tender, +3 edema, also there bruising/ecchymotic area in upper thigh close to groin ; RLE with No cyanosis, trace edema    Data Reviewed: Basic Metabolic Panel:  Recent Labs Lab 09/10/12 1710 09/10/12 2352 09/11/12 0510 09/11/12 0545  NA 140  --  141  --   K 3.7  --  3.8  --  CL 97  --  97  --   CO2 28  --  30  --   GLUCOSE 107*  --  120*  --   BUN 40*  --  43*  --   CREATININE 1.29 1.26 1.43*  --   CALCIUM 8.7  --  8.7  --   MG  --   --   --  2.0   Liver Function Tests: No results found for this basename: AST, ALT, ALKPHOS, BILITOT, PROT, ALBUMIN,  in the last 168 hours No results found for this basename: LIPASE, AMYLASE,  in the last 168 hours No results found for this basename: AMMONIA,  in the last 168 hours CBC:  Recent  Labs Lab 09/10/12 1710 09/10/12 2352 09/11/12 0510  WBC 7.2 7.1 7.7  NEUTROABS 5.9  --   --   HGB 16.5 15.5 15.1  HCT 48.7 47.7 44.4  MCV 78.2 78.8 78.0  PLT 52* 46* 48*   Cardiac Enzymes: No results found for this basename: CKTOTAL, CKMB, CKMBINDEX, TROPONINI,  in the last 168 hours BNP (last 3 results)  Recent Labs  09/10/12 1710  PROBNP 23656.0*   CBG:  Recent Labs Lab 09/10/12 2210 09/11/12 0641  GLUCAP 117* 110*    Recent Results (from the past 240 hour(s))  CLOSTRIDIUM DIFFICILE BY PCR     Status: None   Collection Time    09/11/12 11:13 AM      Result Value Range Status   C difficile by pcr NEGATIVE  NEGATIVE Final     Studies: Dg Chest 2 View  09/10/2012   *RADIOLOGY REPORT*  Clinical Data: Left leg edema  CHEST - 2 VIEW  Comparison: 07/10/2003  Findings: Marked cardiac enlargement.  Vascular congestion with mild edema.  Left lower lobe atelectasis.  Small pleural effusions.  IMPRESSION: Congestive heart failure with mild edema.   Original Report Authenticated By: Janeece Riggers, M.D.    Scheduled Meds: . aspirin EC  81 mg Oral Daily  . carvedilol  3.125 mg Oral BID WC  . clindamycin (CLEOCIN) IV  900 mg Intravenous Q8H  . enoxaparin (LOVENOX) injection  40 mg Subcutaneous Q24H  . lisinopril  10 mg Oral Daily  . mometasone-formoterol  2 puff Inhalation Daily  . potassium chloride SA  40 mEq Oral Daily  . sodium chloride  3 mL Intravenous Q12H  . sodium chloride  3 mL Intravenous Q12H  . tiotropium  18 mcg Inhalation Daily  . torsemide  100 mg Oral Daily   Continuous Infusions:   Principal Problem:   Cellulitis of extremity Active Problems:   CHF (congestive heart failure)   Hypertension   Diabetes mellitus without complication    Time spent: critical care time     [facility]  [facility] Pager [phone]. If 7PM-7AM, please contact night-coverage at [email or web address], password [facility] 09/11/2012, 3:19 PM  LOS: 1 day

## 2012-09-11 NOTE — Progress Notes (Signed)
Report given to charge nurse on 2100.  Patient transferred with all belongings.

## 2012-09-11 NOTE — Progress Notes (Signed)
Pt had 16 beat run VT-asymptomatic. Dr Donna Bernard aware. Mag level checked. K+ 3.8

## 2012-09-11 NOTE — Progress Notes (Signed)
ANTIBIOTIC CONSULT NOTE - INITIAL  Pharmacy Consult for Vancomycin Indication: Cellulitis  Allergies  Allergen Reactions  . Other Nausea And Vomiting    Patient stated drug is "kerein"    Patient Measurements: Height: 6\' 1"  (185.4 cm) Weight: 270 lb 4.5 oz (122.6 kg) IBW/kg (Calculated) : 79.9 Adjusted Body Weight:    Vital Signs: Temp: 97.8 F (36.6 C) (07/12 1355) Temp src: Oral (07/12 1355) BP: 87/59 mmHg (07/12 1935) Pulse Rate: 71 (07/12 1935) Intake/Output from previous day: 07/11 0701 - 07/12 0700 In: 882 [P.O.:882] Out: 500 [Urine:500] Intake/Output from this shift:    Labs:  Recent Labs  09/10/12 1710 09/10/12 2352 09/11/12 0510  WBC 7.2 7.1 7.7  HGB 16.5 15.5 15.1  PLT 52* 46* 48*  CREATININE 1.29 1.26 1.43*   Estimated Creatinine Clearance: 74.4 ml/min (by C-G formula based on Cr of 1.43). No results found for this basename: VANCOTROUGH, Leodis Binet, VANCORANDOM, GENTTROUGH, GENTPEAK, GENTRANDOM, TOBRATROUGH, TOBRAPEAK, TOBRARND, AMIKACINPEAK, AMIKACINTROU, AMIKACIN,  in the last 72 hours   Microbiology: Recent Results (from the past 720 hour(s))  CLOSTRIDIUM DIFFICILE BY PCR     Status: None   Collection Time    09/11/12 11:13 AM      Result Value Range Status   C difficile by pcr NEGATIVE  NEGATIVE Final    Medical History: Past Medical History  Diagnosis Date  . Diabetes mellitus without complication   . CHF (congestive heart failure)   . Hypertension     Medications:  Prescriptions prior to admission  Medication Sig Dispense Refill  . aspirin EC 81 MG tablet Take 81 mg by mouth daily.      . carvedilol (COREG) 3.125 MG tablet Take 3.125 mg by mouth 2 (two) times daily with a meal.      . lisinopril (PRINIVIL,ZESTRIL) 10 MG tablet Take 10 mg by mouth daily.      . mometasone-formoterol (DULERA) 100-5 MCG/ACT AERO Inhale 2 puffs into the lungs daily.      . naproxen sodium (ANAPROX) 220 MG tablet Take 440 mg by mouth daily as needed. For  pain      . potassium chloride SA (K-DUR,KLOR-CON) 20 MEQ tablet Take 40 mEq by mouth daily.      Marland Kitchen tiotropium (SPIRIVA) 18 MCG inhalation capsule Place 18 mcg into inhaler and inhale daily.      Marland Kitchen torsemide (DEMADEX) 100 MG tablet Take 100 mg by mouth daily.       Assessment: worsening redness, swelling and pain to left lower extremity after scraping it on a metal bed. No DVT on dopplers.  PMH: CHF, HTN, DM  ID: Patient afebrile with WBC 7.7 but now hypotensive. Add Vancomycin to Clindamycin. Scr up to 1.43 with estimated CrCl 74 7/11 Clinda>> 7/12 Vanco>>   Goal of Therapy:  Vancomycin trough level 10-15 mcg/ml  Plan:  Vancomycin 2500mg  IV x 1 then 1250mg  IV q12 hrs. Trough after 3-5 doses at steady state.   Makena, Mcgrady Stillinger 09/11/2012,7:46 PM

## 2012-09-11 NOTE — Progress Notes (Signed)
Pt  Receiving second NS bolus 250cc as per Dr Donna Bernard. Rapid response informed of low bp. Will monitor for now

## 2012-09-11 NOTE — Progress Notes (Signed)
eLink Physician-Brief Progress Note Patient Name: Caleb Bauer DOB: June 17, 1950 MRN: [id number]  Date of Service  09/11/2012   HPI/Events of Note   Blood gas, pt with worsening mental status  eICU Interventions  bipap started at 12/8 resp to titrate.   Intervention Category Major Interventions: Hypoxemia - evaluation and management  GIDDINGS, OLIVIA K. 09/11/2012, 10:50 PM

## 2012-09-11 NOTE — Progress Notes (Signed)
Bp low- rechecked. Dr Donna Bernard made aware. Pt has had 5 loose stools this am, c-diff negative ,begun on Imodium. Md ordered to recheck in 1 hr

## 2012-09-12 ENCOUNTER — Inpatient Hospital Stay (HOSPITAL_COMMUNITY): Payer: Medicaid Other

## 2012-09-12 ENCOUNTER — Encounter (HOSPITAL_COMMUNITY): Payer: Self-pay | Admitting: Anesthesiology

## 2012-09-12 ENCOUNTER — Encounter (HOSPITAL_COMMUNITY): Payer: Self-pay | Admitting: General Surgery

## 2012-09-12 DIAGNOSIS — L02419 Cutaneous abscess of limb, unspecified: Secondary | ICD-10-CM

## 2012-09-12 DIAGNOSIS — J9601 Acute respiratory failure with hypoxia: Secondary | ICD-10-CM | POA: Diagnosis present

## 2012-09-12 DIAGNOSIS — J9600 Acute respiratory failure, unspecified whether with hypoxia or hypercapnia: Secondary | ICD-10-CM

## 2012-09-12 DIAGNOSIS — I509 Heart failure, unspecified: Secondary | ICD-10-CM

## 2012-09-12 DIAGNOSIS — L03119 Cellulitis of unspecified part of limb: Secondary | ICD-10-CM

## 2012-09-12 DIAGNOSIS — E119 Type 2 diabetes mellitus without complications: Secondary | ICD-10-CM

## 2012-09-12 DIAGNOSIS — I5043 Acute on chronic combined systolic (congestive) and diastolic (congestive) heart failure: Secondary | ICD-10-CM

## 2012-09-12 LAB — GLUCOSE, CAPILLARY
Glucose-Capillary: 147 mg/dL — ABNORMAL HIGH (ref 70–99)
Glucose-Capillary: 128 mg/dL — ABNORMAL HIGH (ref 70–99)
Glucose-Capillary: 114 mg/dL — ABNORMAL HIGH (ref 70–99)

## 2012-09-12 LAB — URINALYSIS, ROUTINE W REFLEX MICROSCOPIC
Bilirubin Urine: NEGATIVE
Ketones, ur: NEGATIVE mg/dL
Nitrite: NEGATIVE
Protein, ur: NEGATIVE mg/dL
Urobilinogen, UA: 1 mg/dL (ref 0.0–1.0)
pH: 5 (ref 5.0–8.0)

## 2012-09-12 LAB — URINE MICROSCOPIC-ADD ON

## 2012-09-12 LAB — CBC
Hemoglobin: 14.7 g/dL (ref 13.0–17.0)
MCV: 78.3 fL (ref 78.0–100.0)
Platelets: 39 10*3/uL — ABNORMAL LOW (ref 150–400)
RBC: 5.77 MIL/uL (ref 4.22–5.81)
WBC: 7.5 10*3/uL (ref 4.0–10.5)

## 2012-09-12 LAB — COMPREHENSIVE METABOLIC PANEL
ALT: 26 U/L (ref 0–53)
AST: 27 U/L (ref 0–37)
Alkaline Phosphatase: 72 U/L (ref 39–117)
CO2: 27 mEq/L (ref 19–32)
GFR calc Af Amer: 44 mL/min — ABNORMAL LOW (ref >90)
Glucose, Bld: 112 mg/dL — ABNORMAL HIGH (ref 70–99)
Potassium: 3.8 mEq/L (ref 3.5–5.1)
Sodium: 138 mEq/L (ref 135–145)
Total Protein: 5.5 g/dL — ABNORMAL LOW (ref 6.0–8.3)

## 2012-09-12 LAB — BASIC METABOLIC PANEL
BUN: 47 mg/dL — ABNORMAL HIGH (ref 6–23)
CO2: 30 mEq/L (ref 19–32)
Chloride: 101 mEq/L (ref 96–112)
Creatinine, Ser: 1.63 mg/dL — ABNORMAL HIGH (ref 0.50–1.35)
GFR calc Af Amer: 51 mL/min — ABNORMAL LOW (ref >90)
Glucose, Bld: 142 mg/dL — ABNORMAL HIGH (ref 70–99)
Potassium: 3.8 mEq/L (ref 3.5–5.1)

## 2012-09-12 LAB — POCT I-STAT 3, ART BLOOD GAS (G3+)
Bicarbonate: 31.9 mEq/L — ABNORMAL HIGH (ref 20.0–24.0)
O2 Saturation: 99 %
TCO2: 33 mmol/L (ref 0–100)
pCO2 arterial: 49.5 mmHg — ABNORMAL HIGH (ref 35.0–45.0)

## 2012-09-12 LAB — PROTIME-INR: INR: 3.06 — ABNORMAL HIGH (ref 0.00–1.49)

## 2012-09-12 LAB — LACTIC ACID, PLASMA: Lactic Acid, Venous: 1.9 mmol/L (ref 0.5–2.2)

## 2012-09-12 LAB — HEMOGLOBIN A1C: Hgb A1c MFr Bld: 6.2 % — ABNORMAL HIGH (ref <5.7)

## 2012-09-12 MED ORDER — PROPOFOL 10 MG/ML IV BOLUS
INTRAVENOUS | Status: DC | PRN
  Administered 2012-09-12: 40 mg via INTRAVENOUS

## 2012-09-12 MED ORDER — INSULIN ASPART 100 UNIT/ML ~~LOC~~ SOLN
2.0000 [IU] | SUBCUTANEOUS | Status: DC
Start: 2012-09-12 — End: 2012-09-15
  Administered 2012-09-12 – 2012-09-14 (×2): 2 [IU] via SUBCUTANEOUS

## 2012-09-12 MED ORDER — NOREPINEPHRINE BITARTRATE 1 MG/ML IJ SOLN
2.0000 ug/min | INTRAVENOUS | Status: DC
  Administered 2012-09-12: 10.987 ug/min via INTRAVENOUS
  Administered 2012-09-12 – 2012-09-13 (×2): 5 ug/min via INTRAVENOUS
  Administered 2012-09-15: 2 ug/min via INTRAVENOUS
  Filled 2012-09-12 (×5): qty 8

## 2012-09-12 MED ORDER — FENTANYL CITRATE 0.05 MG/ML IJ SOLN
50.0000 ug | INTRAMUSCULAR | Status: DC | PRN
  Administered 2012-09-12 (×2): 100 ug via INTRAVENOUS
  Filled 2012-09-12 (×4): qty 2

## 2012-09-12 MED ORDER — MIDAZOLAM HCL 2 MG/2ML IJ SOLN
2.0000 mg | INTRAMUSCULAR | Status: DC | PRN
  Administered 2012-09-12 (×2): 2 mg via INTRAVENOUS
  Filled 2012-09-12 (×4): qty 2

## 2012-09-12 MED ORDER — CHLORHEXIDINE GLUCONATE 0.12 % MT SOLN
15.0000 mL | Freq: Two times a day (BID) | OROMUCOSAL | Status: DC
  Administered 2012-09-12 – 2012-09-14 (×5): 15 mL via OROMUCOSAL
  Filled 2012-09-12: qty 15
  Filled 2012-09-12: qty 30
  Filled 2012-09-12 (×3): qty 15

## 2012-09-12 MED ORDER — ALBUTEROL SULFATE (5 MG/ML) 0.5% IN NEBU
2.5000 mg | INHALATION_SOLUTION | Freq: Four times a day (QID) | RESPIRATORY_TRACT | Status: DC
  Administered 2012-09-12 – 2012-09-18 (×25): 2.5 mg via RESPIRATORY_TRACT
  Filled 2012-09-12 (×26): qty 0.5

## 2012-09-12 MED ORDER — PIPERACILLIN-TAZOBACTAM 3.375 G IVPB 30 MIN
3.3750 g | Freq: Three times a day (TID) | INTRAVENOUS | Status: AC
  Administered 2012-09-12 – 2012-09-21 (×30): 3.375 g via INTRAVENOUS
  Filled 2012-09-12 (×30): qty 50

## 2012-09-12 MED ORDER — PANTOPRAZOLE SODIUM 40 MG IV SOLR
40.0000 mg | Freq: Every day | INTRAVENOUS | Status: DC
  Administered 2012-09-12 – 2012-09-14 (×3): 40 mg via INTRAVENOUS
  Filled 2012-09-12 (×4): qty 40

## 2012-09-12 MED ORDER — BIOTENE DRY MOUTH MT LIQD
15.0000 mL | Freq: Four times a day (QID) | OROMUCOSAL | Status: DC
  Administered 2012-09-12 – 2012-09-14 (×11): 15 mL via OROMUCOSAL

## 2012-09-12 MED ORDER — IPRATROPIUM BROMIDE 0.02 % IN SOLN
0.5000 mg | Freq: Four times a day (QID) | RESPIRATORY_TRACT | Status: DC
  Administered 2012-09-12 – 2012-09-18 (×25): 0.5 mg via RESPIRATORY_TRACT
  Filled 2012-09-12 (×26): qty 2.5

## 2012-09-12 MED ORDER — SUCCINYLCHOLINE CHLORIDE 20 MG/ML IJ SOLN
INTRAMUSCULAR | Status: DC | PRN
  Administered 2012-09-12: 100 mg via INTRAVENOUS
  Administered 2015-05-30: 120 mg via INTRAVENOUS

## 2012-09-12 MED ORDER — OSMOLITE 1.5 CAL PO LIQD
1000.0000 mL | ORAL | Status: DC
  Administered 2012-09-12 – 2012-09-13 (×2): 1000 mL
  Filled 2012-09-12 (×5): qty 1000

## 2012-09-12 NOTE — Progress Notes (Signed)
eLink Physician-Brief Progress Note Patient Name: Caleb Bauer DOB: Nov 07, 1950 MRN: [id number]  Date of Service  09/12/2012   HPI/Events of Note   Needs vent and sedation order  eICU Interventions  ordersets placed Will follow up CXR   Intervention Category Major Interventions: Respiratory failure - evaluation and management  MCQUAID, DOUGLAS 09/12/2012, 12:05 AM

## 2012-09-12 NOTE — Progress Notes (Signed)
[facility] PROGRESS NOTE  Caleb Bauer ZOX:[id number] DOB: Apr 17, 1950 DOA: 09/10/2012 PCP: Pcp Not In System Overnight events noted, pt now on vasopressors and intubated. Discussed with Dr Wardell Honour who states agrees to transfer pt to his service/CCM. Appreciate assistance, please call as needed.     Kela Millin  [facility] Pager [phone]. If 7PM-7AM, please contact night-coverage at [email or web address], password [facility] 09/12/2012, 11:47 AM  LOS: 2 days

## 2012-09-12 NOTE — Progress Notes (Signed)
Pre Procedure Diagnosis:  ?Nec fasc  Post procedure  Diagnosis: cellulitis  Procedure: cut down to his left lower extremity exploration of the fascia and muscle  Surgeon: Dr. Axel Filler  Assistant: none  Anesthesia: local  EBL: 10 cc  Complications: none  Findings:  Viable white fascia, viable underlying muscle.  Indications for procedure:  The patient is a 62 year old male with history of congestive heart failure diabetes. Patient has history of a left lower extremity trauma. Secondary is erythema and cellulitis we were consulted to rule out possible necrotizing fasciitis.  Details of the procedure: 1% lidocaine was used to infiltrate the area and left lower extremity at the site of erythema. A 15 blade was used to cut down to the fascia. The fascia was visualized and seen to be white. Upon incision there was some bloody fluid drained from the subcutaneous space. This was not foul-smelling and/or dishwater soap like. The fascia was incised and the underlying muscle was pink and viable. 2 sutures were placed in the incision to help with hemostasis and fluid drainage. It was dressed with 4 x 4's.  This completed the procedure.

## 2012-09-12 NOTE — Consults (Signed)
Requested intubation in this pt with PaCO2 of 70 Long med list including diabetes, severe cellulitis of LE, MO, OSA appearnace Failing CPAP with PaCO2 of 70 Somnolent, rhonchi w/decreased lung sounds bilat MP3 airway with multiple missing teeth CV Stable syst BP 150s, ST Propofol 40mg , Anectine 100mg  IV Glidescope used with good visualization Subglottic OET placed at 23cm +EtCO2, BBS = Syst BP 130s No compl Dr Gypsy Balsam 09/11/12  2350-09/12/12 0006

## 2012-09-12 NOTE — Consult Note (Addendum)
PULMONARY  / CRITICAL CARE MEDICINE  Name: Caleb Bauer MRN: [id number] DOB: [birth year]    ADMISSION DATE:  09/10/2012 CONSULTATION DATE:  09/11/2012  REFERRING MD :  Riley Lam PRIMARY SERVICE: PCCM  CHIEF COMPLAINT:  Sepsis, respiratory failure  BRIEF PATIENT DESCRIPTION: 62 y/o male with DM2, CHF was admitted on 7/11 by [facility] to [facility] for cellulitis of his left leg.  On 7/12 he developed respiratory failure and acute encephalopathy requiring mechanical ventilation.  SIGNIFICANT EVENTS / STUDIES:  7/11 admission 7/12 transfer to ICU, intubation   LINES / TUBES: 7/11 ETT >>  CULTURES: 7/12 blood >> 7/12 c. Diff >>   ANTIBIOTICS: 7/12 clinda >> 7/12 vanc >>  HISTORY OF PRESENT ILLNESS:  62 y/o male with DM2, CHF, and unknown respiratory issue (note scar at lower neck possibly trach, with history of spiriva use) was admitted on 7/11 by [facility] to [facility] for cellulitis of his left leg.  Reports that 48 hours prior to admission, he had a mild injury on the lower left leg, around his calf.  Within 24 hours this area became significantly inflamed and ecchymotic.Admited to the hospitalist service for further treatment.  On 7/12 he developed respiratory failure and acute encephalopathy requiring mechanical ventilation.  On 7/12 he received multiple antihypertensives as well as a diuretic while receiving IVF for treatment of his infection.  He developed hypotension to the 70's so PCCM was consulted in the evening.  After transfer to the ICU he was found to be markedly encephalopathic and had a respiratory acidosis.  His mental status worsened on BIPAP and he was intubated.  Following intubation; hypercapnia improved from 70.2 to 49; with improvement of hemodynamics with dopamine infusion.  Good urine output without diuretics, maintaining UOP >200cc an hour currently.   PAST MEDICAL HISTORY :  Past Medical History  Diagnosis Date  . Diabetes mellitus without complication   . CHF (congestive heart  failure)   . Hypertension    History reviewed. No pertinent past surgical history. Prior to Admission medications   Medication Sig Start Date End Date Taking? Authorizing Provider  aspirin EC 81 MG tablet Take 81 mg by mouth daily.   Yes Historical Provider, MD  carvedilol (COREG) 3.125 MG tablet Take 3.125 mg by mouth 2 (two) times daily with a meal.   Yes Historical Provider, MD  lisinopril (PRINIVIL,ZESTRIL) 10 MG tablet Take 10 mg by mouth daily.   Yes Historical Provider, MD  mometasone-formoterol (DULERA) 100-5 MCG/ACT AERO Inhale 2 puffs into the lungs daily.   Yes Historical Provider, MD  naproxen sodium (ANAPROX) 220 MG tablet Take 440 mg by mouth daily as needed. For pain   Yes Historical Provider, MD  potassium chloride SA (K-DUR,KLOR-CON) 20 MEQ tablet Take 40 mEq by mouth daily.   Yes Historical Provider, MD  tiotropium (SPIRIVA) 18 MCG inhalation capsule Place 18 mcg into inhaler and inhale daily.   Yes Historical Provider, MD  torsemide (DEMADEX) 100 MG tablet Take 100 mg by mouth daily.   Yes Historical Provider, MD   Allergies  Allergen Reactions  . Other Nausea And Vomiting    Patient stated drug is "kerein"    FAMILY HISTORY:  History reviewed. No pertinent family history. SOCIAL HISTORY:  reports that he has been smoking Cigarettes.  He has a .125 pack-year smoking history. He does not have any smokeless tobacco history on file. He reports that he does not drink alcohol or use illicit drugs.  REVIEW OF SYSTEMS:  Cannot obtain  due to intubation  SUBJECTIVE:   VITAL SIGNS: Temp:  [97.7 F (36.5 C)-97.8 F (36.6 C)] 97.7 F (36.5 C) (07/13 0005) Pulse Rate:  [67-126] 93 (07/13 0005) Resp:  [12-28] 15 (07/13 0005) BP: (71-126)/(28-74) 124/74 mmHg (07/13 0005) SpO2:  [83 %-100 %] 98 % (07/13 0005) FiO2 (%):  [40 %-60 %] 40 % (07/13 0104) Weight:  [122.6 kg (270 lb 4.5 oz)] 122.6 kg (270 lb 4.5 oz) (07/12 0534) HEMODYNAMICS:   VENTILATOR SETTINGS: Vent Mode:   [-] PRVC FiO2 (%):  [40 %-60 %] 40 % Set Rate:  [12 bmp-15 bmp] 15 bmp Vt Set:  [630 mL] 630 mL PEEP:  [5 cmH20] 5 cmH20 Plateau Pressure:  [24 cmH20] 24 cmH20 INTAKE / OUTPUT: Intake/Output     07/12 0701 - 07/13 0700   P.O. 300   I.V. (mL/kg) 500 (4.1)   Total Intake(mL/kg) 800 (6.5)   Urine (mL/kg/hr) 200 (0.1)   Total Output 200   Net +600       Stool Occurrence 2 x     PHYSICAL EXAMINATION: General: VSS Well developed, well nourished, in no acute distress on mechanical ventilation Head: Eyes PERRLA, No xanthomas. Normal cephalic and atramatic.  No icterus or scleral edema.  No periorbital edema  Lungs: Rales noted at bases, otherwise NO wheezes.  Notable coarse rhonchi throughout thorax, good air movement.   Heart: HRRR S1 S2 Pulses are 2+ & equal, no MR, + for S3 gallop No carotid bruit. No JVD. No abdominal bruits. No femoral bruits.  Abdomen: Bowel sounds are positive, abdomen soft and non-tender without masses, no hepatosplenomegaly.  Noted left side anasarca, mild Extremities: excellent DP and Radial pulses.  Noted bilateral edema, trace on right with significant tense edema at left left to thigh.  Noted ecchymotic rash and lower left leg from ankle to knee.  Pitting edema.  Upper thigh with ecchymosis as well, not as coalesced/much more faint  Neuro: Very agitated when awake, responds appropriately to sedation.  PERRL, gag intact.  Moves all extremities   LABS:  Recent Labs Lab 09/10/12 1710 09/10/12 2352 09/11/12 0510 09/11/12 0545 09/11/12 1935 09/11/12 1940 09/11/12 2232 09/11/12 2246 09/12/12 0102  HGB 16.5 15.5 15.1  --   --   --   --   --   --   WBC 7.2 7.1 7.7  --   --   --   --   --   --   PLT 52* 46* 48*  --   --   --   --   --   --   NA 140  --  141  --   --   --   --   --   --   K 3.7  --  3.8  --   --   --   --   --   --   CL 97  --  97  --   --   --   --   --   --   CO2 28  --  30  --   --   --   --   --   --   GLUCOSE 107*  --  120*  --   --    --   --   --   --   BUN 40*  --  43*  --   --   --   --   --   --   CREATININE 1.29 1.26 1.43*  --   --   --   --   --   --  CALCIUM 8.7  --  8.7  --   --   --   --   --   --   MG  --   --   --  2.0  --   --   --   --   --   LATICACIDVEN  --   --   --   --  4.0*  --   --   --   --   PROCALCITON  --   --   --   --   --  2.56  --   --   --   PROBNP 23656.0*  --   --   --   --   --   --   --   --   PHART  --   --   --   --   --   --  7.168* 7.280* 7.415  PCO2ART  --   --   --   --   --   --  51.4* 70.2* 49.5*  PO2ART  --   --   --   --   --   --  72.2* 88.0 144.0*    Recent Labs Lab 09/10/12 2210 09/11/12 0641 09/11/12 1807 09/11/12 2050  GLUCAP 117* 110* 143* 89    CXR: Cardiomegally, bilateral airspace disease, left pleural effusion; ?retrocardiac infiltrate?  ASSESSMENT / PLAN:  PULMONARY A: Acute hypercarbic respiratory failure due to decompensated CHF and ?pneumonia?  Possible underlying hypoventilation syndrome P:   -full vent support; improved hypercapnia. -culture respiratory secretions -daily wua/sbt/abg/cxr -no evidence of obstructive airway disease on exam -hypercatabolic state with increased work of breathing from pulmonary edema and sepsis likely etiology  CARDIOVASCULAR A: Acute decompensated CHF, uncertain LVEF Shock> presumably mixed cardiogenic and septic P:  -hold BP meds for now -place CVL, measure CVP -dopamine to levophed -echo -check lactic acid; currently trending downward -Optimize hemodynamics with levophed, patient likely septic with underlying myocardial dysfunction -Broad spectrum antibiotics with Vanc/Clinda/Zosyn (day 3/3/1) -Pending cultures; added GNR coverage as patient with spread of cellulitis from lower leg to upper thigh.  If continues to worse, orthopedics has seen -Negative for DVT -Bedside echo with biventricular depressed EF, dilated RA, pending formal  RENAL A:  Worsening renal failure, presumably due to sepsis P:    -monitor UOP; foley in place -foley -repeat BMET now -Stop fluids, vasopressors as needed, check UA specific gravity  GASTROINTESTINAL A:  No acute issues P:   -Pantoprazole for stress ulcer prophylaxis   HEMATOLOGIC A:  Thrombocytopenia, uncertain etiology; likely from sepsis P:  -monitor for bleeding -SCD's in place on right leg -May need subq heparin, however patient with significant thrombocytopenia <50K.  Revisit surgical options, if no surgical intervention needed, may benefit from LMWH  INFECTIOUS A:  Cellulitis left leg; orthopedics has seen and does not feel he has compartment syndrome P:   -vanc/clinda, added zosyn.   -If continues to disseminate, may need re-evaluation for surgical debridement.   -No arterial compromise, good pulses and negative for DVT -f/u cultures -No history of salt water exposure, will need to discuss any any salt water contact with family and if positive will need to cover for vibrio   ENDOCRINE A:  DM2 P:   -SSI for now; ICU step 1 in place  NEUROLOGIC A:  Acute encephalopathy most likely due to hypercapnea  P:   -Allow to wake up; if continues to be agitated, trial of precedex may help  TODAY'S SUMMARY: 61  y/o male with CHF, DM2 admitted on 7/11 with cellulitis, developed acute respiratory failure due to decompensated CHF on 7/12 requiring intubation.  Also in likely mixed shock cardiogenic and septic.  I have personally obtained a history, examined the patient, evaluated laboratory and imaging results, formulated the assessment and plan and placed orders. CRITICAL CARE: The patient is critically ill with multiple organ systems failure and requires high complexity decision making for assessment and support, frequent evaluation and titration of therapies, application of advanced monitoring technologies and extensive interpretation of multiple databases. Critical Care Time devoted to patient care services described in this note is 75  minutes.    Pulmonary and Critical Care Medicine [facility] Pager: [phone]  09/12/2012, 1:22 AM

## 2012-09-12 NOTE — Consult Note (Signed)
I have seen and examined the pt and agree with PA-Osborne's progress note. No indication of necrotizng fasciitis on cut down. I believe he has some cellulitis there that is compounded with his anasarca.
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
## 2012-09-12 NOTE — Transfer of Care (Signed)
Immediate Anesthesia Transfer of Care Note  Patient: Caleb Bauer  Procedure(s) Performed: * No procedures listed *  Patient Location: ICU  Anesthesia Type:General  Level of Consciousness: Patient remains intubated per anesthesia plan  Airway & Oxygen Therapy: Patient remains intubated per anesthesia plan  Post-op Assessment: Report given to PACU RN and Post -op Vital signs reviewed and stable  Post vital signs: Reviewed and stable  Complications: No apparent anesthesia complications

## 2012-09-12 NOTE — Progress Notes (Addendum)
PULMONARY  / CRITICAL CARE MEDICINE  Name: Caleb Bauer MRN: [id number] DOB: [birth year]    ADMISSION DATE:  09/10/2012 CONSULTATION DATE:  09/11/2012  REFERRING MD :  Riley Lam PRIMARY SERVICE: PCCM  CHIEF COMPLAINT:  Sepsis, respiratory failure  BRIEF PATIENT DESCRIPTION: 62 y/o male with DM2, CHF was admitted on 7/11 by TRH to [facility] for cellulitis of his left leg.  On 7/12 he developed respiratory failure and acute encephalopathy requiring mechanical ventilation.  SIGNIFICANT EVENTS / STUDIES:  7/11 admission 7/12 transfer to ICU, intubation   LINES / TUBES: 7/12 ETT >> 7/12 R. IJ >>  CULTURES: 7/12 blood >> 7/12 c. Diff >>   ANTIBIOTICS: 7/12 clinda >> 7/12 vanc >> 7/12 Zosyn >>  HISTORY OF PRESENT ILLNESS:  62 y/o male with DM2, CHF, and unknown respiratory issue (note scar at lower neck possibly trach, with history of spiriva use) was admitted on 7/11 by TRH to [facility] for cellulitis of his left leg.  Reports that 48 hours prior to admission, he had a mild injury on the lower left leg, around his calf.  Within 24 hours this area became significantly inflamed and ecchymotic.Admited to the hospitalist service for further treatment.  On 7/12 he developed respiratory failure and acute encephalopathy requiring mechanical ventilation.  On 7/12 he received multiple antihypertensives as well as a diuretic while receiving IVF for treatment of his infection.  He developed hypotension to the 70's so PCCM was consulted in the evening.  After transfer to the ICU he was found to be markedly encephalopathic and had a respiratory acidosis.  His mental status worsened on BIPAP and he was intubated.  Following intubation; hypercapnia improved from 70.2 to 49; with improvement of hemodynamics with dopamine infusion.  Good urine output without diuretics, maintaining UOP >200cc an hour currently at consult    SUBJECTIVE:  Unresponsive on vent  Circumferential redness along LE persists  Remains on  pressor support    VITAL SIGNS: Temp:  [97.7 F (36.5 C)-98.4 F (36.9 C)] 98.4 F (36.9 C) (07/13 0736) Pulse Rate:  [46-126] 73 (07/13 0736) Resp:  [6-28] 15 (07/13 0736) BP: (71-128)/(28-80) 128/80 mmHg (07/13 0321) SpO2:  [83 %-100 %] 100 % (07/13 0736) FiO2 (%):  [40 %-60 %] 40 % (07/13 0736) Weight:  [270 lb 4.5 oz (122.6 kg)] 270 lb 4.5 oz (122.6 kg) (07/13 0500) HEMODYNAMICS:   VENTILATOR SETTINGS: Vent Mode:  [-] PRVC FiO2 (%):  [40 %-60 %] 40 % Set Rate:  [12 bmp-15 bmp] 15 bmp Vt Set:  [630 mL] 630 mL PEEP:  [5 cmH20] 5 cmH20 Plateau Pressure:  [20 cmH20-24 cmH20] 20 cmH20 INTAKE / OUTPUT: Intake/Output     07/12 0701 - 07/13 0700 07/13 0701 - 07/14 0700   P.O. 300    I.V. (mL/kg) 584.9 (4.8)    IV Piggyback 250    Total Intake(mL/kg) 1134.9 (9.3)    Urine (mL/kg/hr) 950 (0.3)    Total Output 950     Net +184.9          Stool Occurrence 2 x      PHYSICAL EXAMINATION: General: VSS morbidly obese , on vent  HEENT : ETT  NecK : short thick neck  Lungs: scattered bibasilar rales  Heart: RRR  S2 Pulses are 1+ & equal,  No carotid bruit. No JVD. No abdominal bruits. No femoral bruits.  Abdomen: Bowel sounds are positive, abdomen soft, obese no palp. hepatosplenomegaly.  Noted left side anasarca,  Extremities:  significant  tense edema along lower left leg and upper left thigh and lower left panniculus Noted ecchymotic demarcated redness without creptius along upper left mid/lateral thigh. Pitting weeping edmema .  Along left lower extremity with diffuse circumferential redness/ecchymosis extending most of LE , redness along second left digit.  Neuro: unresponsive on vent , easily agitated .  LABS:  Recent Labs Lab 09/10/12 1710 09/10/12 2352 09/11/12 0510 09/11/12 0545 09/11/12 1935 09/11/12 1940 09/11/12 2232 09/11/12 2246 09/12/12 0102 09/12/12 0200 09/12/12 0500 09/12/12 0620  HGB 16.5 15.5 15.1  --   --   --   --   --   --   --  14.7  --   WBC  7.2 7.1 7.7  --   --   --   --   --   --   --  7.5  --   PLT 52* 46* 48*  --   --   --   --   --   --   --  39*  --   NA 140  --  141  --   --   --   --   --   --   --  138  --   K 3.7  --  3.8  --   --   --   --   --   --   --  3.8  --   CL 97  --  97  --   --   --   --   --   --   --  101  --   CO2 28  --  30  --   --   --   --   --   --   --  30  --   GLUCOSE 107*  --  120*  --   --   --   --   --   --   --  142*  --   BUN 40*  --  43*  --   --   --   --   --   --   --  47*  --   CREATININE 1.29 1.26 1.43*  --   --   --   --   --   --   --  1.63*  --   CALCIUM 8.7  --  8.7  --   --   --   --   --   --   --  8.2*  --   MG  --   --   --  2.0  --   --   --   --   --   --   --   --   LATICACIDVEN  --   --   --   --  4.0*  --   --   --   --  1.9  --  2.5*  PROCALCITON  --   --   --   --   --  2.56  --   --   --   --   --   --   PROBNP 23656.0*  --   --   --   --   --   --   --   --   --   --   --   PHART  --   --   --   --   --   --  7.168* 7.280* 7.415  --   --   --  PCO2ART  --   --   --   --   --   --  51.4* 70.2* 49.5*  --   --   --   PO2ART  --   --   --   --   --   --  72.2* 88.0 144.0*  --   --   --     Recent Labs Lab 09/11/12 2050 09/12/12 0203 09/12/12 0425 09/12/12 0551 09/12/12 0724  GLUCAP 89 147* 128* 137* 110*    CXR: Cardiomegally, bilateral airspace disease, left pleural effusion; ?retrocardiac infiltrate?  ASSESSMENT / PLAN:  PULMONARY  Recent Labs Lab 09/11/12 2232 09/11/12 2246 09/12/12 0102  PHART 7.168* 7.280* 7.415  PCO2ART 51.4* 70.2* 49.5*  PO2ART 72.2* 88.0 144.0*  HCO3 18.4* 33.2* 31.9*  TCO2 20.1 35 33  O2SAT 92.0 95.0 99.0    A: Acute hypercarbic respiratory failure due to decompensated CHF and ?pneumonia?  Possible underlying hypoventilation syndrome P:   -full vent support; not ready for wean  -follow cx data  -daily wua/sbt/abg/cxr -check inhalers to albut/atrov neb q6     CARDIOVASCULAR  Recent Labs Lab 09/10/12 1710  PROBNP  23656.0*    A: Acute decompensated CHF, uncertain LVEF Shock> presumably mixed cardiogenic and septic 712 Ven Dopp Negative for DVT 7/13-Bedside echo with biventricular depressed EF, dilated RA, pending formal  P:  -Titrate pressors for MAP >65  -echo pending  -tr PCT /LA   -check CVP q4   RENAL  Recent Labs Lab 09/10/12 1710 09/10/12 2352 09/11/12 0510 09/11/12 0545 09/12/12 0500  NA 140  --  141  --  138  K 3.7  --  3.8  --  3.8  CL 97  --  97  --  101  CO2 28  --  30  --  30  GLUCOSE 107*  --  120*  --  142*  BUN 40*  --  43*  --  47*  CREATININE 1.29 1.26 1.43*  --  1.63*  CALCIUM 8.7  --  8.7  --  8.2*  MG  --   --   --  2.0  --     A:  Worsening renal failure, presumably due to sepsis P:   -monitor UOP  -strict i/o  -replace electrolytes   GASTROINTESTINAL A:  No acute issues P:   -Pantoprazole for stress ulcer prophylaxis   HEMATOLOGIC A:  Thrombocytopenia, uncertain etiology; likely from sepsis P:  -monitor for bleeding -SCD's in place on right leg -May need subq heparin, however patient with significant thrombocytopenia <50K.  Revisit surgical options, if no surgical intervention needed, may benefit from LMWH -check LFTs and INR   INFECTIOUS  Recent Labs Lab 09/10/12 2352 09/11/12 0510 09/12/12 0500  HGB 15.5 15.1 14.7  HCT 47.7 44.4 45.2  WBC 7.1 7.7 7.5  PLT 46* 48* 39*    Recent Labs Lab 09/11/12 1940  PROCALCITON 2.56     A:  Cellulitis left leg; orthopedics has seen and does not feel he has compartment syndrome 09/12/2012 >surgical consult /CCS  P:   -vanc/clinda,  zosyn.   - follow pan cx  -check LE film  -tr PCT and LA   ENDOCRINE  Recent Labs Lab 09/11/12 2050 09/12/12 0203 09/12/12 0425 09/12/12 0551 09/12/12 0724  GLUCAP 89 147* 128* 137* 110*    A:  DM2 P:   -SSI for now; ICU step 1 in place  NEUROLOGIC A:  Acute encephalopathy most likely due to  hypercapnea  P:   -Allow to wake up; if continues to  be agitated, trial of precedex may help  TODAY'S SUMMARY: 62 y/o male with CHF, DM2 admitted on 7/11 with cellulitis, developed acute respiratory failure due to decompensated CHF on 7/12 requiring intubation.  Also in likely mixed shock cardiogenic and septic. CCS consult to r/o nec fasc .     STAFF NOTE  STAFF NOTE: I, Dr Lavinia Sharps have personally reviewed patient's available data, including medical history, events of note, physical examination and test results as part of my evaluation. I have discussed with resident/NP and other care providers such as pharmacist, RN and RRT.  In addition,  I personally evaluated patient and elicited key findings of patient with CHF admitted with left thigh cellulitis. Earlier in the morning I was asked him a concern that distal lower extremity on the left side was necrotizing fascitis based on my personal exam. I reviewed surgical notes and reevaluate patient at this time and noted no evidence of necrotizing fasciitis on incisional exam. Continue cellulitis antibiotic therapies as outlined.  Rest per NP/medical resident whose note is outlined above and that I agree with  The patient is critically ill with multiple organ systems failure and requires high complexity decision making for assessment and support, frequent evaluation and titration of therapies, application of advanced monitoring technologies and extensive interpretation of multiple databases.   Critical Care Time devoted to patient care services described in this note is  31  Minutes independent of nurse practitioner.  Dr. Kalman Shan, M.D., [facility].C.P Pulmonary and Critical Care Medicine Staff Physician [facility] [place] Pulmonary and Critical Care Pager: [phone], If no answer or between  15:00h - 7:00h: call [phone]  09/12/2012 4:02 PM

## 2012-09-13 ENCOUNTER — Inpatient Hospital Stay (HOSPITAL_COMMUNITY): Payer: Medicaid Other

## 2012-09-13 LAB — BLOOD GAS, ARTERIAL
Acid-Base Excess: 5.3 mmol/L — ABNORMAL HIGH (ref 0.0–2.0)
Bicarbonate: 28.7 mEq/L — ABNORMAL HIGH (ref 20.0–24.0)
FIO2: 0.4 %
MECHVT: 630 mL
Patient temperature: 98.6
TCO2: 29.8 mmol/L (ref 0–100)

## 2012-09-13 LAB — URINE CULTURE
Colony Count: NO GROWTH
Culture: NO GROWTH

## 2012-09-13 LAB — GLUCOSE, CAPILLARY
Glucose-Capillary: 101 mg/dL — ABNORMAL HIGH (ref 70–99)
Glucose-Capillary: 116 mg/dL — ABNORMAL HIGH (ref 70–99)
Glucose-Capillary: 88 mg/dL (ref 70–99)
Glucose-Capillary: 92 mg/dL (ref 70–99)

## 2012-09-13 LAB — POCT I-STAT 3, ART BLOOD GAS (G3+)
TCO2: 39 mmol/L (ref 0–100)
pCO2 arterial: 48.5 mmHg — ABNORMAL HIGH (ref 35.0–45.0)
pH, Arterial: 7.494 — ABNORMAL HIGH (ref 7.350–7.450)

## 2012-09-13 LAB — COMPREHENSIVE METABOLIC PANEL
BUN: 45 mg/dL — ABNORMAL HIGH (ref 6–23)
CO2: 34 mEq/L — ABNORMAL HIGH (ref 19–32)
Calcium: 8.1 mg/dL — ABNORMAL LOW (ref 8.4–10.5)
Creatinine, Ser: 1.61 mg/dL — ABNORMAL HIGH (ref 0.50–1.35)
GFR calc Af Amer: 52 mL/min — ABNORMAL LOW (ref >90)
GFR calc non Af Amer: 45 mL/min — ABNORMAL LOW (ref >90)
Glucose, Bld: 113 mg/dL — ABNORMAL HIGH (ref 70–99)
Sodium: 145 mEq/L (ref 135–145)
Total Protein: 5.4 g/dL — ABNORMAL LOW (ref 6.0–8.3)

## 2012-09-13 LAB — CBC
Hemoglobin: 15.4 g/dL (ref 13.0–17.0)
MCH: 26.1 pg (ref 26.0–34.0)
MCHC: 34.4 g/dL (ref 30.0–36.0)
MCV: 76.1 fL — ABNORMAL LOW (ref 78.0–100.0)
RBC: 5.89 MIL/uL — ABNORMAL HIGH (ref 4.22–5.81)

## 2012-09-13 LAB — PROCALCITONIN: Procalcitonin: 2.4 ng/mL

## 2012-09-13 LAB — LACTIC ACID, PLASMA: Lactic Acid, Venous: 2.1 mmol/L (ref 0.5–2.2)

## 2012-09-13 MED ORDER — PROMOTE PO LIQD
1000.0000 mL | ORAL | Status: DC
  Administered 2012-09-13 – 2012-09-14 (×2): 1000 mL
  Filled 2012-09-13 (×3): qty 1000

## 2012-09-13 MED ORDER — PRO-STAT SUGAR FREE PO LIQD
60.0000 mL | Freq: Every day | ORAL | Status: DC
  Administered 2012-09-13 – 2012-09-14 (×6): 60 mL
  Filled 2012-09-13 (×8): qty 60

## 2012-09-13 MED ORDER — POTASSIUM CHLORIDE 10 MEQ/50ML IV SOLN
10.0000 meq | INTRAVENOUS | Status: AC
  Administered 2012-09-13 (×2): 10 meq via INTRAVENOUS
  Filled 2012-09-13: qty 100

## 2012-09-13 MED ORDER — ADULT MULTIVITAMIN W/MINERALS CH: 1.0000 | ORAL_TABLET | Freq: Every day | ORAL | Status: DC

## 2012-09-13 MED ORDER — ADULT MULTIVITAMIN LIQUID CH
5.0000 mL | Freq: Every day | ORAL | Status: DC
  Administered 2012-09-13 – 2012-09-14 (×2): 5 mL
  Filled 2012-09-13 (×2): qty 5

## 2012-09-13 NOTE — Progress Notes (Signed)
[facility] ADULT ICU REPLACEMENT PROTOCOL FOR AM LAB REPLACEMENT ONLY  The patient does apply for the [facility] Adult ICU Electrolyte Replacment Protocol based on the criteria listed below:   1. Is GFR >/= 40 ml/min? yes  Patient's GFR today is 52 2. Is urine output >/= 0.5 ml/kg/hr for the last 6 hours? yes Patient's UOP is 2.4 ml/kg/hr 3. Is BUN < 60 mg/dL? yes  Patient's BUN today is 45 4. Abnormal electrolyte(s): K+3.3 5. Ordered repletion with: protocol 6. If a panic level lab has been reported, has the CCM MD in charge been notified? yes.   Physician:  Rocky Morel, Renae Fickle Hilliard 09/13/2012 5:30 AM

## 2012-09-13 NOTE — Progress Notes (Signed)
INITIAL NUTRITION ASSESSMENT  DOCUMENTATION CODES Per approved criteria  -Obesity Unspecified   INTERVENTION: 1. Change TF to Promote @ 20 ml/hr with 60 ml Prostat five times per day.  2. MVI daily  TF regimen will provide 1480 kcal (63% of kcal needs), 180 grams protein (> 100% of needs), and 403 ml H2O.  NUTRITION DIAGNOSIS: Inadequate oral intake related to inability to eat as evidenced by NPO status.  Goal: Enteral nutrition to provide 60-70% of estimated calorie needs (22-25 kcals/kg ideal body weight) and 100% of estimated protein needs, based on ASPEN guidelines for permissive underfeeding in critically ill obese individuals.  Monitor:  Vent status, TF tolerance, weight  Reason for Assessment: Consult received to initiate and manage enteral nutrition support.  62 y.o. male  Admitting Dx: Cellulitis of extremity  ASSESSMENT: Pt with DM2 and CHF was admitted on 7/11 by TRH to [facility] for cellulitis of his left leg. On 7/12 he developed respiratory failure and acute encephalopathy requiring mechanical ventilation. Pt also with stage II on buttocks. Potassium decreased, being replaced IV.  Patient is currently intubated on ventilator support.  MV: 12.1  Temp:Temp (24hrs), Avg:99.5 F (37.5 C), Min:98.7 F (37.1 C), Max:100 F (37.8 C)  Patient has NG tube in place. Osmolite 1.5 is infusing @ 20 ml/hr. Tube feeding regimen currently providing 720 kcal, 29 grams protein.   Free water flushes: NA  Residuals: 0   Height: Ht Readings from Last 1 Encounters:  09/10/12 6\' 1"  (1.854 m)    Weight: Wt Readings from Last 1 Encounters:  09/13/12 267 lb 6.7 oz (121.3 kg)    Ideal Body Weight: 83.6 kg  % Ideal Body Weight: 145%  Wt Readings from Last 10 Encounters:  09/13/12 267 lb 6.7 oz (121.3 kg)    Usual Body Weight: unknown  % Usual Body Weight:  -   BMI:  Body mass index is 35.29 kg/(m^2).  Estimated Nutritional Needs: Kcal: 2350 Protein: >/= 167  grams Fluid: >2 L/day  Skin: stage II buttocks, cellulitis left lower extremity  Diet Order:   NPO  EDUCATION NEEDS: -No education needs identified at this time   Intake/Output Summary (Last 24 hours) at 09/13/12 0928 Last data filed at 09/13/12 6578  Gross per 24 hour  Intake 2045.64 ml  Output   6175 ml  Net -4129.36 ml    Last BM: 7/12   Labs:   Recent Labs Lab 09/11/12 0510 09/11/12 0545 09/12/12 0500 09/12/12 1356 09/13/12 0435  NA 141  --  138 138 145  K 3.8  --  3.8 3.8 3.3*  CL 97  --  101 98 102  CO2 30  --  30 27 34*  BUN 43*  --  47* 49* 45*  CREATININE 1.43*  --  1.63* 1.84* 1.61*  CALCIUM 8.7  --  8.2* 8.2* 8.1*  MG  --  2.0  --   --  1.5  PHOS  --   --   --   --  4.5  GLUCOSE 120*  --  142* 112* 113*    CBG (last 3)   Recent Labs  09/13/12 0019 09/13/12 0424 09/13/12 0859  GLUCAP 92 110* 101*    Scheduled Meds: . albuterol  2.5 mg Nebulization Q6H  . antiseptic oral rinse  15 mL Mouth Rinse QID  . chlorhexidine  15 mL Mouth Rinse BID  . clindamycin (CLEOCIN) IV  900 mg Intravenous Q8H  . feeding supplement (OSMOLITE 1.5 CAL)  1,000 mL  Per Tube Q24H  . insulin aspart  2-6 Units Subcutaneous Q4H  . ipratropium  0.5 mg Nebulization Q6H  . pantoprazole (PROTONIX) IV  40 mg Intravenous Daily  . piperacillin-tazobactam  3.375 g Intravenous Q8H  . sodium chloride  3 mL Intravenous Q12H  . vancomycin  1,250 mg Intravenous Q12H    Continuous Infusions: . norepinephrine (LEVOPHED) Adult infusion 5 mcg/min (09/13/12 0455)    Past Medical History  Diagnosis Date  . Diabetes mellitus without complication   . CHF (congestive heart failure)   . Hypertension     History reviewed. No pertinent past surgical history.  Kendell Bane RD, LDN, CNSC [phone] Pager [phone] After Hours Pager

## 2012-09-13 NOTE — Progress Notes (Signed)
PULMONARY  / CRITICAL CARE MEDICINE  Name: Caleb Bauer MRN: [id number] DOB: 07/08/1950    ADMISSION DATE:  09/10/2012 CONSULTATION DATE:  09/11/2012  REFERRING MD :  Riley Lam PRIMARY SERVICE: PCCM  CHIEF COMPLAINT:  Sepsis, respiratory failure  BRIEF PATIENT DESCRIPTION: 62 y/o male with DM2, CHF was admitted on 7/11 by TRH to [facility] for cellulitis of his left leg.  On 7/12 he developed respiratory failure and acute encephalopathy requiring mechanical ventilation.  SIGNIFICANT EVENTS / STUDIES:  7/11 admission 7/12 transfer to ICU, intubation   LINES / TUBES: 7/12 ETT >> 7/12 R. IJ >>  CULTURES: 7/12 blood >> 7/12 c. Diff >> neg 7/12 urine>>>neg  ANTIBIOTICS: 7/12 clinda >>> 7/12 vanc >>> 7/12 Zosyn >>>  SUBJECTIVE: low dose pressors still required  VITAL SIGNS: Temp:  [98.4 F (36.9 C)-100 F (37.8 C)] 98.7 F (37.1 C) (07/14 1145) Pulse Rate:  [51-78] 60 (07/14 1145) Resp:  [14-18] 18 (07/14 1145) BP: (86-117)/(64-79) 117/65 mmHg (07/14 1145) SpO2:  [87 %-100 %] 95 % (07/14 1145) FiO2 (%):  [40 %] 40 % (07/14 1145) Weight:  [121.3 kg (267 lb 6.7 oz)] 121.3 kg (267 lb 6.7 oz) (07/14 0500) HEMODYNAMICS: CVP:  [8 mmHg-10 mmHg] 8 mmHg VENTILATOR SETTINGS: Vent Mode:  [-] PRVC FiO2 (%):  [40 %] 40 % Set Rate:  [15 bmp-18 bmp] 18 bmp Vt Set:  [450 mL-630 mL] 630 mL PEEP:  [5 cmH20] 5 cmH20 Plateau Pressure:  [20 cmH20-25 cmH20] 21 cmH20 INTAKE / OUTPUT: Intake/Output     07/13 0701 - 07/14 0700 07/14 0701 - 07/15 0700   P.O.     I.V. (mL/kg) 1043.9 (8.6) 120 (1)   NG/GT 91.5 120   IV Piggyback 1046.5 347.5   Total Intake(mL/kg) 2181.9 (18) 587.5 (4.8)   Urine (mL/kg/hr) 6175 (2.1) 1850 (2)   Total Output 6175 1850   Net -3993.1 -1262.5          PHYSICAL EXAMINATION: General:sedated rass 1 Neuro: rass 1, follows commands HEENT: trach scar PULM: ronchi CV:  s1 s2 rrr no r GI: soft, bs wnl , no r/g Extremities: edema, extensive, left leg wound,  draining, purple hue  LABS:  Recent Labs Lab 09/10/12 1710  09/11/12 0510 09/11/12 0545  09/11/12 1940  09/11/12 2246 09/12/12 0102  09/12/12 0500 09/12/12 0620 09/12/12 1200 09/12/12 1356 09/13/12 0325 09/13/12 0435  HGB 16.5  < > 15.1  --   --   --   --   --   --   --  14.7  --   --   --   --  15.4  WBC 7.2  < > 7.7  --   --   --   --   --   --   --  7.5  --   --   --   --  7.4  PLT 52*  < > 48*  --   --   --   --   --   --   --  39*  --   --   --   --  62*  NA 140  --  141  --   --   --   --   --   --   --  138  --   --  138  --  145  K 3.7  --  3.8  --   --   --   --   --   --   --  3.8  --   --  3.8  --  3.3*  CL 97  --  97  --   --   --   --   --   --   --  101  --   --  98  --  102  CO2 28  --  30  --   --   --   --   --   --   --  30  --   --  27  --  34*  GLUCOSE 107*  --  120*  --   --   --   --   --   --   --  142*  --   --  112*  --  113*  BUN 40*  --  43*  --   --   --   --   --   --   --  47*  --   --  49*  --  45*  CREATININE 1.29  < > 1.43*  --   --   --   --   --   --   --  1.63*  --   --  1.84*  --  1.61*  CALCIUM 8.7  --  8.7  --   --   --   --   --   --   --  8.2*  --   --  8.2*  --  8.1*  MG  --   --   --  2.0  --   --   --   --   --   --   --   --   --   --   --  1.5  PHOS  --   --   --   --   --   --   --   --   --   --   --   --   --   --   --  4.5  AST  --   --   --   --   --   --   --   --   --   --   --   --   --  27  --  21  ALT  --   --   --   --   --   --   --   --   --   --   --   --   --  26  --  23  ALKPHOS  --   --   --   --   --   --   --   --   --   --   --   --   --  72  --  72  BILITOT  --   --   --   --   --   --   --   --   --   --   --   --   --  2.4*  --  2.9*  PROT  --   --   --   --   --   --   --   --   --   --   --   --   --  5.5*  --  5.4*  ALBUMIN  --   --   --   --   --   --   --   --   --   --   --   --   --  1.6*  --  1.4*  INR  --   --   --   --   --   --   --   --   --   --   --   --  3.06*  --   --   --   LATICACIDVEN  --    --   --   --   < >  --   --   --   --   < >  --  2.5* 2.4*  --   --  2.1  PROCALCITON  --   --   --   --   --  2.56  --   --   --   --   --   --   --  2.87  --  2.40  PROBNP 23656.0*  --   --   --   --   --   --   --   --   --   --   --   --   --   --   --   PHART  --   --   --   --   --   --   < > 7.280* 7.415  --   --   --   --   --  7.494*  --   PCO2ART  --   --   --   --   --   --   < > 70.2* 49.5*  --   --   --   --   --  37.7  --   PO2ART  --   --   --   --   --   --   < > 88.0 144.0*  --   --   --   --   --  138.0*  --   < > = values in this interval not displayed.  Recent Labs Lab 09/12/12 2001 09/13/12 0019 09/13/12 0424 09/13/12 0859 09/13/12 1145  GLUCAP 104* 92 110* 101* 88    CXR: ett wnl, linme wnl, int edema  ASSESSMENT / PLAN:  PULMONARY  Recent Labs Lab 09/11/12 2232 09/11/12 2246 09/12/12 0102 09/13/12 0325  PHART TEST WILL BE CREDITED 7.280* 7.415 7.494*  PCO2ART TEST WILL BE CREDITED 70.2* 49.5* 37.7  PO2ART TEST WILL BE CREDITED 88.0 144.0* 138.0*  HCO3 TEST WILL BE CREDITED 33.2* 31.9* 28.7*  TCO2 TEST WILL BE CREDITED 35 33 29.8  O2SAT TEST WILL BE CREDITED 95.0 99.0 99.4    A: Acute hypercarbic respiratory failure due to decompensated CHF and ?pneumonia?  Possible underlying hypoventilation syndrome, pulm edema P:   -allow neg balance noted last 24 hrs -avoid alkalosis, rate to 10, may need T V reduction , abg to follow afterrate -wean sbt, cpap 5 ps 5, goal 2 hrs -check inhalers to albut/atrov neb q6     CARDIOVASCULAR  Recent Labs Lab 09/10/12 1710  PROBNP 23656.0*    A: Acute decompensated CHF, uncertain LVEF Shock> presumably mixed cardiogenic and septic 712 Ven Dopp Negative for DVT 7/13-Bedside echo with biventricular depressed EF, dilated RA, pending formal  P:  -Titrate pressors for MAP >65  -echo -20%, sever dilated, cant exclude apical thrombus -cvp assessment may not be helpful, as allow neg balance for now -may need  dobutamine -cortisol, trop, ecg  Stress steroids ok empiric for now May need TEE for apical thrombus Dx?  RENAL  Recent Labs Lab 09/10/12 1710 09/10/12  2352 09/11/12 0510 09/11/12 0545 09/12/12 0500 09/12/12 1356 09/13/12 0435  NA 140  --  141  --  138 138 145  K 3.7  --  3.8  --  3.8 3.8 3.3*  CL 97  --  97  --  101 98 102  CO2 28  --  30  --  30 27 34*  GLUCOSE 107*  --  120*  --  142* 112* 113*  BUN 40*  --  43*  --  47* 49* 45*  CREATININE 1.29 1.26 1.43*  --  1.63* 1.84* 1.61*  CALCIUM 8.7  --  8.7  --  8.2* 8.2* 8.1*  MG  --   --   --  2.0  --   --  1.5  PHOS  --   --   --   --   --   --  4.5    A:  ARF, hypokalemia P:   -monitor UOP  -strict i/o  -replace electrolytes K today -hold further lasix, allow auto diuresis for now  GASTROINTESTINAL A:  No acute issues P:   -Pantoprazole for stress ulcer prophylaxis -TF increase  HEMATOLOGIC A:  Thrombocytopenia, uncertain etiology; likely from sepsis, coagulapathy frmo sepsis likely P:  -monitor for bleeding -SCD's in place on right leg -follow plat trend in am then decide on sub q hep and follow coags that were elevated Fibrinogen r/o DIC  INFECTIOUS  Recent Labs Lab 09/11/12 0510 09/12/12 0500 09/13/12 0435  HGB 15.1 14.7 15.4  HCT 44.4 45.2 44.8  WBC 7.7 7.5 7.4  PLT 48* 39* 62*    Recent Labs Lab 09/11/12 1940 09/12/12 1356 09/13/12 0435  PROCALCITON 2.56 2.87 2.40     A:  Cellulitis left leg; orthopedics has seen and does not feel he has compartment syndrome 09/13/2012 >surgical consult /CCS  P:   -vanc,  Zosyn -follow OR culture if no staph, strep group , dc clinda -surgery following  ENDOCRINE  Recent Labs Lab 09/12/12 2001 09/13/12 0019 09/13/12 0424 09/13/12 0859 09/13/12 1145  GLUCAP 104* 92 110* 101* 88   A:  DM2 P:   -SSI   NEUROLOGIC A:  Acute encephalopathy most likely due to hypercapnea  P:   -WUA PRNs  TODAY'S SUMMARY: weaning, still on pressors, echo  reviewed  Ccm time 30 min   Mcarthur Rossetti. Tyson Alias, MD, FACP Pgr: [phone] [place] [facility]

## 2012-09-13 NOTE — Progress Notes (Signed)
UR Completed.  Caleb Bauer [phone] 09/13/2012

## 2012-09-13 NOTE — ED Provider Notes (Signed)
Medical screening examination/treatment/procedure(s) were performed by non-physician practitioner and as supervising physician I was immediately available for consultation/collaboration.   Shelda Jakes, MD 09/13/12 [phone]

## 2012-09-13 NOTE — Progress Notes (Signed)
RT attempted wean with pt on ventilator, pt did not tolerate well. Pt had low tidal volumes and low respiratory rates. Pt was placed back on full support at this time.

## 2012-09-13 NOTE — Progress Notes (Signed)
  Echocardiogram 2D Echocardiogram has been performed.  Ellenie Salome 09/13/2012, 8:56 AM

## 2012-09-14 ENCOUNTER — Inpatient Hospital Stay (HOSPITAL_COMMUNITY): Payer: Medicaid Other

## 2012-09-14 LAB — POCT I-STAT 3, ART BLOOD GAS (G3+)
Acid-Base Excess: 5 mmol/L — ABNORMAL HIGH (ref 0.0–2.0)
Bicarbonate: 30.3 mEq/L — ABNORMAL HIGH (ref 20.0–24.0)
O2 Saturation: 99 %
Patient temperature: 97.5
TCO2: 32 mmol/L (ref 0–100)
pH, Arterial: 7.453 — ABNORMAL HIGH (ref 7.350–7.450)

## 2012-09-14 LAB — CBC
Hemoglobin: 15.7 g/dL (ref 13.0–17.0)
Platelets: 47 10*3/uL — ABNORMAL LOW (ref 150–400)
RBC: 5.98 MIL/uL — ABNORMAL HIGH (ref 4.22–5.81)
WBC: 7.3 10*3/uL (ref 4.0–10.5)

## 2012-09-14 LAB — BASIC METABOLIC PANEL
Calcium: 8.2 mg/dL — ABNORMAL LOW (ref 8.4–10.5)
GFR calc Af Amer: 61 mL/min — ABNORMAL LOW (ref >90)
GFR calc non Af Amer: 53 mL/min — ABNORMAL LOW (ref >90)
Glucose, Bld: 134 mg/dL — ABNORMAL HIGH (ref 70–99)
Potassium: 3 mEq/L — ABNORMAL LOW (ref 3.5–5.1)
Sodium: 149 mEq/L — ABNORMAL HIGH (ref 135–145)

## 2012-09-14 LAB — GLUCOSE, CAPILLARY
Glucose-Capillary: 120 mg/dL — ABNORMAL HIGH (ref 70–99)
Glucose-Capillary: 97 mg/dL (ref 70–99)
Glucose-Capillary: 114 mg/dL — ABNORMAL HIGH (ref 70–99)

## 2012-09-14 LAB — PROTIME-INR
INR: 1.7 — ABNORMAL HIGH (ref 0.00–1.49)
Prothrombin Time: 19.5 seconds — ABNORMAL HIGH (ref 11.6–15.2)

## 2012-09-14 LAB — CORTISOL: Cortisol, Plasma: 15.4 ug/dL

## 2012-09-14 MED ORDER — METOPROLOL TARTRATE 12.5 MG HALF TABLET
12.5000 mg | ORAL_TABLET | Freq: Two times a day (BID) | ORAL | Status: DC
  Filled 2012-09-14 (×3): qty 1

## 2012-09-14 MED ORDER — ISOSORB DINITRATE-HYDRALAZINE 20-37.5 MG PO TABS
1.0000 | ORAL_TABLET | Freq: Three times a day (TID) | ORAL | Status: DC
  Administered 2012-09-14 – 2012-09-27 (×38): 1 via ORAL
  Filled 2012-09-14 (×41): qty 1

## 2012-09-14 MED ORDER — METOPROLOL TARTRATE 12.5 MG HALF TABLET
12.5000 mg | ORAL_TABLET | Freq: Two times a day (BID) | ORAL | Status: DC
  Filled 2012-09-14: qty 1

## 2012-09-14 MED ORDER — POTASSIUM CHLORIDE 20 MEQ/15ML (10%) PO LIQD
30.0000 meq | ORAL | Status: AC
  Administered 2012-09-14 (×2): 30 meq
  Filled 2012-09-14: qty 15
  Filled 2012-09-14: qty 30

## 2012-09-14 MED ORDER — ASPIRIN 81 MG PO CHEW
81.0000 mg | CHEWABLE_TABLET | Freq: Every day | ORAL | Status: DC
  Administered 2012-09-14 – 2012-09-27 (×14): 81 mg via ORAL
  Filled 2012-09-14 (×13): qty 1

## 2012-09-14 MED ORDER — FREE WATER
200.0000 mL | Freq: Three times a day (TID) | Status: DC
  Administered 2012-09-14: 200 mL

## 2012-09-14 MED ORDER — DIGOXIN 125 MCG PO TABS
0.1250 mg | ORAL_TABLET | Freq: Every day | ORAL | Status: DC
  Administered 2012-09-14 – 2012-09-27 (×14): 0.125 mg via ORAL
  Filled 2012-09-14 (×14): qty 1

## 2012-09-14 MED ORDER — SODIUM CHLORIDE 0.45 % IV SOLN
INTRAVENOUS | Status: DC
  Administered 2012-09-14: 10 mL/h via INTRAVENOUS
  Administered 2012-09-15: 500 mL via INTRAVENOUS
  Administered 2012-09-18: 07:00:00 via INTRAVENOUS
  Administered 2012-09-23: 1000 mL via INTRAVENOUS
  Administered 2012-09-25: 20 mL/h via INTRAVENOUS

## 2012-09-14 NOTE — Progress Notes (Signed)
ANTIBIOTIC CONSULT NOTE - FOLLOW UP  Pharmacy Consult for Vancomycin Indication: Cellulitis  Allergies  Allergen Reactions  . Other Nausea And Vomiting    Patient stated drug is "kerein"    Patient Measurements: Height: 6\' 1"  (185.4 cm) Weight: 255 lb 11.7 oz (116 kg) IBW/kg (Calculated) : 79.9  Vital Signs: Temp: 98.3 F (36.8 C) (07/15 0900) Temp src: Other (Comment) (07/15 0900) BP: 105/76 mmHg (07/15 0751) Pulse Rate: 84 (07/15 0900) Intake/Output from previous day: 07/14 0701 - 07/15 0700 In: 2058.7 [I.V.:656.2; NG/GT:480; IV Piggyback:922.5] Out: 3750 [Urine:3750] Intake/Output from this shift: Total I/O In: 58.8 [I.V.:18.8; NG/GT:40] Out: 200 [Urine:200]  Labs:  Recent Labs  09/12/12 0500 09/12/12 1356 09/13/12 0435 09/14/12 0400  WBC 7.5  --  7.4 7.3  HGB 14.7  --  15.4 15.7  PLT 39*  --  62* 47*  CREATININE 1.63* 1.84* 1.61* 1.40*   Estimated Creatinine Clearance: 73.9 ml/min (by C-G formula based on Cr of 1.4). No results found for this basename: VANCOTROUGH, Leodis Binet, VANCORANDOM, GENTTROUGH, GENTPEAK, GENTRANDOM, TOBRATROUGH, TOBRAPEAK, TOBRARND, AMIKACINPEAK, AMIKACINTROU, AMIKACIN,  in the last 72 hours   Microbiology: Recent Results (from the past 720 hour(s))  CLOSTRIDIUM DIFFICILE BY PCR     Status: None   Collection Time    09/11/12 11:13 AM      Result Value Range Status   C difficile by pcr NEGATIVE  NEGATIVE Final  CULTURE, BLOOD (ROUTINE X 2)     Status: None   Collection Time    09/11/12  8:20 PM      Result Value Range Status   Specimen Description BLOOD RIGHT ARM   Final   Special Requests BOTTLES DRAWN AEROBIC AND ANAEROBIC 10CC EA   Final   Culture  Setup Time 09/12/2012 03:24   Final   Culture     Final   Value:        BLOOD CULTURE RECEIVED NO GROWTH TO DATE CULTURE WILL BE HELD FOR 5 DAYS BEFORE ISSUING A FINAL NEGATIVE REPORT   Report Status PENDING   Incomplete  CULTURE, BLOOD (ROUTINE X 2)     Status: None   Collection  Time    09/11/12  8:30 PM      Result Value Range Status   Specimen Description BLOOD LEFT HAND   Final   Special Requests BOTTLES DRAWN AEROBIC ONLY 10CC   Final   Culture  Setup Time 09/12/2012 03:24   Final   Culture     Final   Value:        BLOOD CULTURE RECEIVED NO GROWTH TO DATE CULTURE WILL BE HELD FOR 5 DAYS BEFORE ISSUING A FINAL NEGATIVE REPORT   Report Status PENDING   Incomplete  URINE CULTURE     Status: None   Collection Time    09/12/12  6:15 AM      Result Value Range Status   Specimen Description URINE, CATHETERIZED   Final   Special Requests NONE   Final   Culture  Setup Time 09/12/2012 06:44   Final   Colony Count NO GROWTH   Final   Culture NO GROWTH   Final   Report Status 09/13/2012 FINAL   Final    Medical History: Past Medical History  Diagnosis Date  . Diabetes mellitus without complication   . CHF (congestive heart failure)   . Hypertension     Medications:  Prescriptions prior to admission  Medication Sig Dispense Refill  . aspirin EC 81 MG  tablet Take 81 mg by mouth daily.      . carvedilol (COREG) 3.125 MG tablet Take 3.125 mg by mouth 2 (two) times daily with a meal.      . lisinopril (PRINIVIL,ZESTRIL) 10 MG tablet Take 10 mg by mouth daily.      . mometasone-formoterol (DULERA) 100-5 MCG/ACT AERO Inhale 2 puffs into the lungs daily.      . naproxen sodium (ANAPROX) 220 MG tablet Take 440 mg by mouth daily as needed. For pain      . potassium chloride SA (K-DUR,KLOR-CON) 20 MEQ tablet Take 40 mEq by mouth daily.      Marland Kitchen tiotropium (SPIRIVA) 18 MCG inhalation capsule Place 18 mcg into inhaler and inhale daily.      Marland Kitchen torsemide (DEMADEX) 100 MG tablet Take 100 mg by mouth daily.       Assessment: worsening redness, swelling and pain to left lower extremity after scraping it on a metal bed. No DVT on dopplers.  PMH: CHF, HTN, DM  ID: Patient afebrile with WBC 7.3 Scr down to 1.40 with estimated CrCl 74  7/13 Zosyn>> 7/11 Clinda>> 7/12  Vanco>>  Goal of Therapy:  Vancomycin trough level 10-15 mcg/ml  Plan:  Continue Vancomycin 1250mg  IV q12h Monitor WBC, clinical status F/U cultures, if OR Cx neg, d/c clindamycin Vancomycin trough 7/16 AM  Rhina Kramme B. Artelia Laroche, PharmD Clinical Pharmacist - Resident Pager: [phone] Phone: [phone] 09/14/2012 10:24 AM

## 2012-09-14 NOTE — Progress Notes (Signed)
[facility] ADULT ICU REPLACEMENT PROTOCOL FOR AM LAB REPLACEMENT ONLY  The patient does apply for the [facility] Adult ICU Electrolyte Replacment Protocol based on the criteria listed below:   1. Is GFR >/= 40 ml/min? yes  Patient's GFR today is 61 2. Is urine output >/= 0.5 ml/kg/hr for the last 6 hours? yes Patient's UOP is 0.6 ml/kg/hr 3. Is BUN < 60 mg/dL? yes  Patient's BUN today is 39 4. Abnormal electrolyte(s):K+3.0 5. Ordered repletion with: protocol 6. If a panic level lab has been reported, has the CCM MD in charge been notified? yes.   Physician:  Arsenio Loader Hilliard 09/14/2012 5:05 AM

## 2012-09-14 NOTE — Care Management Note (Signed)
    Page 1 of 1   09/14/2012     4:09:29 PM   CARE MANAGEMENT NOTE 09/14/2012  Patient:  Caleb Bauer, Caleb Bauer   Account Number:  [id number]  Date Initiated:  09/13/2012  Documentation initiated by:  Avie Arenas  Subjective/Objective Assessment:   Cellulitis - lead to Sepsis - intubated.     Action/Plan:   Anticipated DC Date:  09/20/2012   Anticipated DC Plan:  HOME W HOME HEALTH SERVICES      DC Planning Services  CM consult      Choice offered to / List presented to:             Status of service:  In process, will continue to follow Medicare Important Message given?   (If response is "NO", the following Medicare IM given date fields will be blank) Date Medicare IM given:   Date Additional Medicare IM given:    Discharge Disposition:    Per UR Regulation:  Reviewed for med. necessity/level of care/duration of stay  If discussed at Long Length of Stay Meetings, dates discussed:    Comments:  Contact: Simmons,Beatrice Mother [phone]  09-14-12 4pm Avie Arenas, RNBSN [phone] Extubated today.  Lives with niece - ev[place] sister.  Mother lives [place]  States can care for self - independent prior to admission.

## 2012-09-14 NOTE — Progress Notes (Signed)
PULMONARY  / CRITICAL CARE MEDICINE  Name: Caleb Bauer MRN: [id number] DOB: 04/13/1950    ADMISSION DATE:  09/10/2012 CONSULTATION DATE:  09/11/2012  REFERRING MD :  Riley Lam PRIMARY SERVICE: PCCM  CHIEF COMPLAINT:  Sepsis, respiratory failure  BRIEF PATIENT DESCRIPTION: 62 y/o male with DM2, CHF was admitted on 7/11 by [facility] to [facility] for cellulitis of his left leg.  On 7/12 he developed respiratory failure and acute encephalopathy requiring mechanical ventilation.  SIGNIFICANT EVENTS / STUDIES:  7/11 admission 7/12 transfer to ICU, intubation  7/14- neg 1.5 liters 7/15-off pressors  LINES / TUBES: 7/12 ETT >> 7/12 R. IJ >>  CULTURES: 7/12 blood >> 7/12 c. Diff >> neg 7/12 urine>>>neg  ANTIBIOTICS: 7/12 clinda >>>7/15 7/12 vanc >>>7/15 7/12 Zosyn >>>  SUBJECTIVE: Patient intubated but alert, acknowledges questions with head nods  VITAL SIGNS: Temp:  [98 F (36.7 C)-98.7 F (37.1 C)] 98 F (36.7 C) (07/15 0600) Pulse Rate:  [41-139] 99 (07/15 0751) Resp:  [11-22] 11 (07/15 0751) BP: (101-125)/(59-78) 105/76 mmHg (07/15 0751) SpO2:  [87 %-100 %] 97 % (07/15 0600) FiO2 (%):  [40 %] 40 % (07/15 0751) Weight:  [255 lb 11.7 oz (116 kg)] 255 lb 11.7 oz (116 kg) (07/15 0500) HEMODYNAMICS:   VENTILATOR SETTINGS: Vent Mode:  [-] PRVC FiO2 (%):  [40 %] 40 % Set Rate:  [10 bmp-18 bmp] 10 bmp Vt Set:  [550 mL-630 mL] 550 mL PEEP:  [5 cmH20] 5 cmH20 Plateau Pressure:  [19 cmH20-23 cmH20] 20 cmH20 INTAKE / OUTPUT: Intake/Output     07/14 0701 - 07/15 0700 07/15 0701 - 07/16 0700   I.V. (mL/kg) 646.8 (5.6)    NG/GT 460    IV Piggyback 922.5    Total Intake(mL/kg) 2029.3 (17.5)    Urine (mL/kg/hr) 3650 (1.3)    Total Output 3650     Net -1620.7          Stool Occurrence 3 x      PHYSICAL EXAMINATION: General: awake, alert. Acknowledges questions HEENT: trach scar PULM: ronchi CV:  s1 s2 rrr no r GI: soft, bs wnl , no r/g Extremities: edema, extensive, left  leg wound, suture area clean, no drainage or signs of infection  LABS:  Recent Labs Lab 09/10/12 1710  09/11/12 0545  09/11/12 1940  09/12/12 0102  09/12/12 0500 09/12/12 0620 09/12/12 1200 09/12/12 1356 09/13/12 0325 09/13/12 0435 09/13/12 1500 09/13/12 1638 09/14/12 0400  HGB 16.5  < >  --   --   --   --   --   --  14.7  --   --   --   --  15.4  --   --  15.7  WBC 7.2  < >  --   --   --   --   --   --  7.5  --   --   --   --  7.4  --   --  7.3  PLT 52*  < >  --   --   --   --   --   --  39*  --   --   --   --  62*  --   --  47*  NA 140  < >  --   --   --   --   --   --  138  --   --  138  --  145  --   --  149*  K  3.7  < >  --   --   --   --   --   --  3.8  --   --  3.8  --  3.3*  --   --  3.0*  CL 97  < >  --   --   --   --   --   --  101  --   --  98  --  102  --   --  105  CO2 28  < >  --   --   --   --   --   --  30  --   --  27  --  34*  --   --  34*  GLUCOSE 107*  < >  --   --   --   --   --   --  142*  --   --  112*  --  113*  --   --  134*  BUN 40*  < >  --   --   --   --   --   --  47*  --   --  49*  --  45*  --   --  39*  CREATININE 1.29  < >  --   --   --   --   --   --  1.63*  --   --  1.84*  --  1.61*  --   --  1.40*  CALCIUM 8.7  < >  --   --   --   --   --   --  8.2*  --   --  8.2*  --  8.1*  --   --  8.2*  MG  --   --  2.0  --   --   --   --   --   --   --   --   --   --  1.5  --   --   --   PHOS  --   --   --   --   --   --   --   --   --   --   --   --   --  4.5  --   --   --   AST  --   --   --   --   --   --   --   --   --   --   --  27  --  21  --   --   --   ALT  --   --   --   --   --   --   --   --   --   --   --  26  --  23  --   --   --   ALKPHOS  --   --   --   --   --   --   --   --   --   --   --  72  --  72  --   --   --   BILITOT  --   --   --   --   --   --   --   --   --   --   --  2.4*  --  2.9*  --   --   --   PROT  --   --   --   --   --   --   --   --   --   --   --  5.5*  --  5.4*  --   --   --   ALBUMIN  --   --   --   --   --   --   --    --   --   --   --  1.6*  --  1.4*  --   --   --   INR  --   --   --   --   --   --   --   --   --   --  3.06*  --   --   --   --   --  1.70*  LATICACIDVEN  --   --   --   < >  --   --   --   < >  --  2.5* 2.4*  --   --  2.1  --   --   --   TROPONINI  --   --   --   --   --   --   --   --   --   --   --   --   --   --  <0.30  --   --   PROCALCITON  --   --   --   --  2.56  --   --   --   --   --   --  2.87  --  2.40  --   --   --   PROBNP 23656.0*  --   --   --   --   --   --   --   --   --   --   --   --   --   --   --   --   PHART  --   --   --   --   --   < > 7.415  --   --   --   --   --  7.494*  --   --  7.494*  --   PCO2ART  --   --   --   --   --   < > 49.5*  --   --   --   --   --  37.7  --   --  48.5*  --   PO2ART  --   --   --   --   --   < > 144.0*  --   --   --   --   --  138.0*  --   --  129.0*  --   < > = values in this interval not displayed.  Recent Labs Lab 09/13/12 1145 09/13/12 1652 09/13/12 2112 09/14/12 0038 09/14/12 0336  GLUCAP 88 101* 116* 124* 120*    CXR: ett wnl, linme wnl, int edema unchaged, large heart  ASSESSMENT / PLAN:  PULMONARY  Recent Labs Lab 09/11/12 2232 09/11/12 2246 09/12/12 0102 09/13/12 0325 09/13/12 1638  PHART TEST WILL BE CREDITED 7.280* 7.415 7.494* 7.494*  PCO2ART TEST WILL BE CREDITED 70.2* 49.5* 37.7 48.5*  PO2ART TEST WILL BE CREDITED 88.0 144.0* 138.0* 129.0*  HCO3 TEST WILL BE CREDITED 33.2* 31.9* 28.7* 37.3*  TCO2 TEST WILL BE CREDITED 35 33 29.8 39  O2SAT TEST WILL BE CREDITED 95.0 99.0 99.4 99.0    A: Acute hypercarbic respiratory failure due to decompensated CHF and ?pneumonia?  Possible underlying hypoventilation syndrome (initial co2 chem 28-30), pulm edema P:   -  allow neg balance to continue -avoid alkalosis, low MV -wean sbt, cpap 5 ps 5, goal 2 hrs -inhalers to albut/atrov neb q6 -No role acetazolamide    CARDIOVASCULAR  Recent Labs Lab 09/10/12 1710  PROBNP 23656.0*    A: Acute decompensated CHF,  uncertain LVEF Shock> presumably mixed cardiogenic and septic 712 Ven Dopp Negative for DVT 7/13-Bedside echo with biventricular depressed EF, dilated RA, pending formal 7/13-norepinephrine given, BP 105/76, P 99 7/14-EKG showed possible L atrial enlargement and new RBBB (no old ecg in emr)  P:  -off pressors -echo -20%, sever dilated, cant exclude apical thrombus -cortisol 15 but no steroid given as off pressors -trop neg, ecg with ?new RBBB = will cardiomyopathy likely all related to dilated heart, at risk PE? But doppler neg and low clinical suspion  May need TEE for apical thrombus Dx, will consult cardiology, anticoagulation has been held thus far with surgical? Needs bleeding risk and liver  RENAL  Recent Labs Lab 09/11/12 0510 09/11/12 0545 09/12/12 0500 09/12/12 1356 09/13/12 0435 09/14/12 0400  NA 141  --  138 138 145 149*  K 3.8  --  3.8 3.8 3.3* 3.0*  CL 97  --  101 98 102 105  CO2 30  --  30 27 34* 34*  GLUCOSE 120*  --  142* 112* 113* 134*  BUN 43*  --  47* 49* 45* 39*  CREATININE 1.43*  --  1.63* 1.84* 1.61* 1.40*  CALCIUM 8.7  --  8.2* 8.2* 8.1* 8.2*  MG  --  2.0  --   --  1.5  --   PHOS  --   --   --   --  4.5  --     A:  ARF improving, hypokalemia, hypernatremia P:   -monitor UOP  -strict i/o  -replace electrolytes K today -hold further lasix, allow auto diuresis for now but add free water -avoid saline  GASTROINTESTINAL A:  Bm noted P:   -Pantoprazole for stress ulcer prophylaxis -TF increase  HEMATOLOGIC A:  Thrombocytopenia, uncertain etiology; likely from sepsis, coagulapathy frmo sepsis likely, r/lo apical thrombus P:  -SCD's in place on right leg -Fibrinogen r/o DIC, pending -send HITT assay, low suspicion, no DTI for now  INFECTIOUS  Recent Labs Lab 09/12/12 0500 09/13/12 0435 09/14/12 0400  HGB 14.7 15.4 15.7  HCT 45.2 44.8 46.8  WBC 7.5 7.4 7.3  PLT 39* 62* 47*    Recent Labs Lab 09/11/12 1940 09/12/12 1356  09/13/12 0435  PROCALCITON 2.56 2.87 2.40     A:  Cellulitis left leg P:   -Maintain Zosyn -dc vanc, clinda -surgery following  ENDOCRINE  Recent Labs Lab 09/13/12 1145 09/13/12 1652 09/13/12 2112 09/14/12 0038 09/14/12 0336  GLUCAP 88 101* 116* 124* 120*   A:  DM2 P:   -SSI   NEUROLOGIC A:  Acute encephalopathy most likely due to hypercapnea  P:   -WUA PRNs -cam neg  TODAY'S SUMMARY: weaning, off pressors, cards to eval TEE>??  Ccm time 30 min   Mcarthur Rossetti. Tyson Alias, MD, FACP Pgr: [phone] [place] [facility]

## 2012-09-14 NOTE — Consult Note (Signed)
CARDIOLOGY CONSULT NOTE  Patient ID: Caleb Bauer MRN: [id number] DOB/AGE: [birth year] 62 y.o.  Admit date: 09/10/2012 Referring Physician  Micah Flesher, MD Primary Physician:  Pcp Not In System Reason for Consultation  Cardiomyopathy and LV mural thrombus  HPI: Patient is a 62 year old [place] American male who has history of diabetes and was actually living in [place], [place] and recently moved to [place], [place] 2 months ago to live with his niece. He states that he was admitted to [facility] in [place] about 2-3 years ago with congestive heart failure and has been told that he has weak heart. He was recently admitted to the hospital on 09/10/2012 with left leg cellulitis after trauma. He then developed respiratory distress and was intubated. An echocardiogram of pain had revealed severe LV systolic dysfunction with ejection fraction of 20-25% with global hypokinesis and markedly dilated left ventricle. There was a question whether he is apical mural thrombus. Due to abnormal echocardiogram and abnormal EKG and was referred to me for evaluation. Patient has chronic shortness breath and dyspnea on exertion. He states that he been doing well and for the past 2-3 years, states that he's not been a hospital. I could not get complete history of his medications prior to the hospital admission. Presently patient states that he is short winded, but states that he is comfortable. No chest pain, palpitations. He does give history of orthopnea and PND prior to hospital admission. He denies any history to suggest prior cardiac workup including cardiac catheterization, myocardial infarction, TIA or stroke.  Past Medical History  Diagnosis Date  . Diabetes mellitus without complication   . CHF (congestive heart failure)   . Hypertension      Family history: No premature coronary artery disease in the family.  Social History: Patient states that he smokes about 3  cigarettes a day. Denies alcohol or illicit drug use. History   Social History  . Marital Status: Single    Spouse Name: N/A    Number of Children: N/A  . Years of Education: N/A   Occupational History  . Not on file.   Social History Main Topics  . Smoking status: Current Every Day Smoker -- 0.25 packs/day for .5 years    Types: Cigarettes  . Smokeless tobacco: Not on file  . Alcohol Use: No  . Drug Use: No  . Sexually Active: Not on file   Other Topics Concern  . Not on file   Social History Narrative  . No narrative on file   social history: Patient used to live with his sister in [place], recently evicted and is now moving with his knees in [facility] [place].  Prescriptions prior to admission  Medication Sig Dispense Refill  . aspirin EC 81 MG tablet Take 81 mg by mouth daily.      . carvedilol (COREG) 3.125 MG tablet Take 3.125 mg by mouth 2 (two) times daily with a meal.      . lisinopril (PRINIVIL,ZESTRIL) 10 MG tablet Take 10 mg by mouth daily.      . mometasone-formoterol (DULERA) 100-5 MCG/ACT AERO Inhale 2 puffs into the lungs daily.      . naproxen sodium (ANAPROX) 220 MG tablet Take 440 mg by mouth daily as needed. For pain      . potassium chloride SA (K-DUR,KLOR-CON) 20 MEQ tablet Take 40 mEq by mouth daily.      Marland Kitchen tiotropium (SPIRIVA) 18 MCG inhalation capsule Place 18 mcg into inhaler and inhale  daily.      . torsemide (DEMADEX) 100 MG tablet Take 100 mg by mouth daily.        Scheduled Meds: . albuterol  2.5 mg Nebulization Q6H  . antiseptic oral rinse  15 mL Mouth Rinse QID  . aspirin  81 mg Oral Daily  . chlorhexidine  15 mL Mouth Rinse BID  . digoxin  0.125 mg Oral Daily  . feeding supplement  60 mL Per Tube 5 X Daily  . feeding supplement (PROMOTE)  1,000 mL Per Tube Q24H  . free water  200 mL Per Tube Q8H  . insulin aspart  2-6 Units Subcutaneous Q4H  . ipratropium  0.5 mg Nebulization Q6H  . isosorbide-hydrALAZINE  1 tablet Oral  TID  . metoprolol tartrate  12.5 mg Oral BID  . multivitamin  5 mL Per Tube Daily  . pantoprazole (PROTONIX) IV  40 mg Intravenous Daily  . piperacillin-tazobactam  3.375 g Intravenous Q8H  . sodium chloride  3 mL Intravenous Q12H   Continuous Infusions: . sodium chloride 10 mL/hr (09/14/12 1533)  . norepinephrine (LEVOPHED) Adult infusion Stopped (09/14/12 1209)   PRN Meds:.fentaNYL, midazolam, ondansetron (ZOFRAN) IV, ondansetron, sodium chloride  ROS: General: no fevers/chills/night sweats Eyes: no blurry vision, diplopia, or amaurosis ENT: no sore throat or hearing loss GI: no abdominal pain, nausea, vomiting, diarrhea, or constipation GU: no dysuria, frequency, or hematuria Neuro: no headache, numbness, tingling, or weakness of extremities Heme: no bleeding, DVT, or easy bruising Endo: no polydipsia or polyuria   Physical Exam: Blood pressure 100/73, pulse 37, temperature 98.8 F (37.1 C), temperature source Other (Comment), resp. rate 28, height 6\' 1"  (1.854 m), weight 116 kg (255 lb 11.7 oz), SpO2 86.00%.   General appearance: alert, cooperative, appears older than stated age, no distress and moderately obese Lungs: wheezes bibasilar and bilaterally Heart: S1, S2 normal, S3 present and no rub Abdomen: soft, non-tender; bowel sounds normal; no masses,  no organomegaly Extremities: Bilateral trace edema present, left leg has cellulitis changes in his shin. Pulses: 2+ and symmetric Neurologic: Grossly normal  Labs:   Lab Results  Component Value Date   WBC 7.3 09/14/2012   HGB 15.7 09/14/2012   HCT 46.8 09/14/2012   MCV 78.3 09/14/2012   PLT 47* 09/14/2012    Recent Labs Lab 09/13/12 0435 09/14/12 0400  NA 145 149*  K 3.3* 3.0*  CL 102 105  CO2 34* 34*  BUN 45* 39*  CREATININE 1.61* 1.40*  CALCIUM 8.1* 8.2*  PROT 5.4*  --   BILITOT 2.9*  --   ALKPHOS 72  --   ALT 23  --   AST 21  --   GLUCOSE 113* 134*   Lab Results  Component Value Date   TROPONINI  <0.30 09/13/2012    Lipid Panel  No results found for this basename: chol, trig, hdl, cholhdl, vldl, ldlcalc    EKG: 09/13/2012: Normal sinus rhythm, left atrial abnormality, inferior infarct, old. Right bundle branch block, lateral nonspecific ST segment changes cannot rule out ischemia versus LVH with strain. Compared to the EKG that was performed today on 09/14/2012, patient has underlying sinus tachycardia, I cannot completely exclude atrial tachycardia at the rate of 120 beats per minute.  Echo 09/13/2012: Left ventricle: The cavity size was severely dilated. Systolic function was severely reduced. The estimated ejection fraction was in the range of 20% to 25%. Cannot exclude apical thrombus. - Left atrium: The atrium was mildly dilated. - Right ventricle: The  cavity size was moderately dilated. - Right atrium: The atrium was mildly dilated. IVC dilated, suggests elevated right heart pressure.     Radiology: Dg Chest Port 1 View  09/14/2012   *RADIOLOGY REPORT*  Clinical Data: Evaluate endotracheal tube position, intubation  PORTABLE CHEST - 1 VIEW  Comparison: Portable exam 0617 hours compared to 09/13/2012  Findings: Tip of endotracheal tube projects 5.2 cm above carina. Nasogastric tube extends into abdomen. Right jugular central venous catheter tip projects over SVC. Rotated to the left. Enlargement of cardiac silhouette with pulmonary vascular congestion. Bilateral perihilar infiltrates question edema. Persistent left lower lobe opacification either represent coexistent atelectasis or consolidation. No pneumothorax. Numerous EKG leads project over chest.  IMPRESSION: Enlargement of cardiac silhouette with pulmonary vascular congestion and diffuse pulmonary edema. Persistent atelectasis versus consolidation of left lower lobe. Little interval change.   Original Report Authenticated By: Ulyses Southward, M.D.   Dg Chest Port 1 View  09/13/2012   *RADIOLOGY REPORT*  Clinical Data: Intubated,  shortness of breath  PORTABLE CHEST - 1 VIEW  Comparison: 09/12/2012; 09/10/2012; 07/10/2003  Findings: Grossly unchanged enlarged cardiac silhouette and mediastinal contours.  Stable position of support apparatus.  No pneumothorax.  Pulmonary vasculature remains indistinct with cephalization of flow.  Grossly unchanged left basilar/retrocardiac consolidative opacities.  Suspected small left-sided pleural effusion is unchanged.  Grossly unchanged right perihilar atelectasis.  No new focal airspace opacity.  Unchanged bones.  IMPRESSION: 1.  Stable positioning of support apparatus.  No pneumothorax. 2.  Grossly unchanged findings of edema and left lower lung dense consolidation, atelectasis versus infiltrate.   Original Report Authenticated By: Tacey Ruiz, MD    ASSESSMENT AND PLAN:  1. Dilated cardiomyopathy, severe LV systolic dysfunction probably chronic. Left ventricular mural thrombus could not be excluded by transthoracic echocardiogram. 2. Acute on chronic systolic and diastolic heart failure. 3. Abnormal EKG, revealing left atrial abnormality, right bundle branch block, cannot exclude inferior MI old. The EKG that was performed this afternoon, reveals sinus tachycardia, I cannot completely exclude atrial tachycardia. 4. Diabetes mellitus type 2 uncontrolled. 5. Chronic renal insufficiency 6. Thrombocytopenia  Recommendation: Evaluation of the patient's status, even if he has indeed left ventricular mural thrombus, I do not think that this is new, the severely dilated left ventricle suggest, it is a chronic problem. 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 Therapy for dilated cardiomyopathy, acute decompensated systolic and diastolic heart failure is indicated at this time. Patient is off of pressors just this afternoon, I will  start the patient on a very low dose of beta blocker along with Bidil and placed parameters to hold if systolic blood pressure less than 90 mm mercury. He'll also benefit from being on digoxin. I will continue to follow the patient with you. Thank you for consultation.  Pamella Pert, MD 09/14/2012, 4:39 PM [facility] Pager: [phone] Office: [phone] If no answer Cell [phone]

## 2012-09-14 NOTE — Procedures (Signed)
Extubation Procedure Note  Patient Details:   Name: SLATER MCMANAMAN DOB: Oct 07, 1950 MRN: [id number]   Airway Documentation:  Airway 8 mm (Active)  Secured at (cm) 24 cm 09/12/2012 12:00 AM    Evaluation  O2 sats: stable throughout Complications: No apparent complications Patient did tolerate procedure well. Bilateral Breath Sounds: Diminished Suctioning: Airway Yes Pt extubated and placed on 3L [place]. No stridor or other complications noted. RT Will monitor.   Kristian Covey Arlene 09/14/2012, 2:47 PM
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
## 2012-09-15 ENCOUNTER — Inpatient Hospital Stay (HOSPITAL_COMMUNITY): Payer: Medicaid Other

## 2012-09-15 ENCOUNTER — Encounter (HOSPITAL_COMMUNITY): Payer: Self-pay | Admitting: Cardiology

## 2012-09-15 LAB — GLUCOSE, CAPILLARY
Glucose-Capillary: 85 mg/dL (ref 70–99)
Glucose-Capillary: 71 mg/dL (ref 70–99)
Glucose-Capillary: 73 mg/dL (ref 70–99)
Glucose-Capillary: 85 mg/dL (ref 70–99)
Glucose-Capillary: 73 mg/dL (ref 70–99)

## 2012-09-15 LAB — CBC WITH DIFFERENTIAL/PLATELET
Basophils Absolute: 0 10*3/uL (ref 0.0–0.1)
Eosinophils Relative: 0 % (ref 0–5)
Lymphocytes Relative: 7 % — ABNORMAL LOW (ref 12–46)
Monocytes Relative: 6 % (ref 3–12)
Neutrophils Relative %: 87 % — ABNORMAL HIGH (ref 43–77)
Platelets: 48 10*3/uL — ABNORMAL LOW (ref 150–400)
RBC: 5.54 MIL/uL (ref 4.22–5.81)
RDW: 22 % — ABNORMAL HIGH (ref 11.5–15.5)
WBC: 12.2 10*3/uL — ABNORMAL HIGH (ref 4.0–10.5)

## 2012-09-15 LAB — LACTIC ACID, PLASMA: Lactic Acid, Venous: 1.6 mmol/L (ref 0.5–2.2)

## 2012-09-15 LAB — BASIC METABOLIC PANEL
BUN: 32 mg/dL — ABNORMAL HIGH (ref 6–23)
Chloride: 107 mEq/L (ref 96–112)
Chloride: 104 mEq/L (ref 96–112)
Creatinine, Ser: 1 mg/dL (ref 0.50–1.35)
GFR calc Af Amer: 76 mL/min — ABNORMAL LOW (ref >90)
GFR calc Af Amer: >90 mL/min (ref >90)
GFR calc non Af Amer: 66 mL/min — ABNORMAL LOW (ref >90)
GFR calc non Af Amer: 79 mL/min — ABNORMAL LOW (ref >90)
Potassium: 2.9 mEq/L — ABNORMAL LOW (ref 3.5–5.1)
Potassium: 3.4 mEq/L — ABNORMAL LOW (ref 3.5–5.1)
Sodium: 151 mEq/L — ABNORMAL HIGH (ref 135–145)

## 2012-09-15 LAB — CBC
MCHC: 33.3 g/dL (ref 30.0–36.0)
Platelets: 45 10*3/uL — ABNORMAL LOW (ref 150–400)
RDW: 22.2 % — ABNORMAL HIGH (ref 11.5–15.5)
WBC: 9.9 10*3/uL (ref 4.0–10.5)

## 2012-09-15 LAB — CK: Total CK: 37 U/L (ref 7–232)

## 2012-09-15 LAB — MAGNESIUM: Magnesium: 1.4 mg/dL — ABNORMAL LOW (ref 1.5–2.5)

## 2012-09-15 MED ORDER — POTASSIUM CHLORIDE 10 MEQ/50ML IV SOLN
INTRAVENOUS | Status: AC
  Administered 2012-09-15: 10 meq via INTRAVENOUS
  Filled 2012-09-15: qty 100

## 2012-09-15 MED ORDER — ADENOSINE 6 MG/2ML IV SOLN
INTRAVENOUS | Status: AC
  Administered 2012-09-15: 12 mg via INTRAVENOUS
  Filled 2012-09-15: qty 4

## 2012-09-15 MED ORDER — ADENOSINE 12 MG/4ML IV SOLN: 12.0000 mg | Freq: Once | INTRAVENOUS | Status: AC

## 2012-09-15 MED ORDER — PREDNISONE 20 MG PO TABS
40.0000 mg | ORAL_TABLET | Freq: Every day | ORAL | Status: DC
  Administered 2012-09-16 – 2012-09-18 (×3): 40 mg via ORAL
  Filled 2012-09-15 (×4): qty 2

## 2012-09-15 MED ORDER — POTASSIUM CHLORIDE 10 MEQ/50ML IV SOLN
10.0000 meq | INTRAVENOUS | Status: AC
  Administered 2012-09-15 (×7): 10 meq via INTRAVENOUS
  Filled 2012-09-15: qty 300

## 2012-09-15 MED ORDER — FENTANYL CITRATE 0.05 MG/ML IJ SOLN
25.0000 ug | Freq: Once | INTRAMUSCULAR | Status: AC
  Administered 2012-09-15: 25 ug via INTRAVENOUS
  Filled 2012-09-15: qty 2

## 2012-09-15 MED ORDER — SODIUM CHLORIDE 0.9 % IV SOLN
6.0000 g | Freq: Once | INTRAVENOUS | Status: AC
  Administered 2012-09-15: 6 g via INTRAVENOUS
  Filled 2012-09-15: qty 12

## 2012-09-15 MED ORDER — GLUCERNA SHAKE PO LIQD
237.0000 mL | Freq: Three times a day (TID) | ORAL | Status: DC
  Administered 2012-09-16 – 2012-09-23 (×20): 237 mL via ORAL

## 2012-09-15 NOTE — Progress Notes (Signed)
eLink Physician-Brief Progress Note Patient Name: Caleb Bauer DOB: 1950-07-07 MRN: [id number]  Date of Service  09/15/2012   HPI/Events of Note   Registered nurse calling eLink.  Patient complaining of pain in his left hand. On camera exam patient has swelling in the dorsal aspect of the left hand at the base of the left index and middle finger. There is red and tender to touch. Registered nurse denies any crepitus   eICU Interventions   check lactic acid, CK, CBC, be met  Fentanyl for pain  Might need evaluation for this infection by surgery    Intervention Category Intermediate Interventions: Pain - evaluation and management  Mallorey Odonell 09/15/2012, 4:54 AM

## 2012-09-15 NOTE — Progress Notes (Signed)
eLink Nursing ICU Electrolyte Replacement Protocol  Patient Name: Caleb Bauer DOB: 1950/10/31 MRN: [id number]  Date of Service  09/15/2012   HPI/Events of Note    Recent Labs Lab 09/11/12 0510 09/11/12 0545 09/12/12 0500 09/12/12 1356 09/13/12 0435 09/14/12 0400 09/15/12 0507  NA 141  --  138 138 145 149* 151*  K 3.8  --  3.8 3.8 3.3* 3.0* 2.9*  CL 97  --  101 98 102 105 107  CO2 30  --  30 27 34* 34* 35*  GLUCOSE 120*  --  142* 112* 113* 134* 79  BUN 43*  --  47* 49* 45* 39* 37*  CREATININE 1.43*  --  1.63* 1.84* 1.61* 1.40* 1.17  CALCIUM 8.7  --  8.2* 8.2* 8.1* 8.2* 8.2*  MG  --  2.0  --   --  1.5  --  1.4*  PHOS  --   --   --   --  4.5  --  3.3    Estimated Creatinine Clearance: 89.3 ml/min (by C-G formula based on Cr of 1.17).  Intake/Output     07/15 0701 - 07/16 0700   P.O. 480   I.V. (mL/kg) 130.9 (1.1)   NG/GT 660   IV Piggyback 400   Total Intake(mL/kg) 1670.9 (14.1)   Urine (mL/kg/hr) 1395 (0.5)   Total Output 1395   Net +275.9       Stool Occurrence 2 x    - I/O DETAILED x24h    Total I/O In: 607.6 [P.O.:480; I.V.:77.6; IV Piggyback:50] Out: 650 [Urine:650] - I/O THIS SHIFT    ASSESSMENT   eICURN Interventions  K+2.9 Mg 1.4 Electrolyte protocol criteria met.  Replace per protocol.  MD notified   ASSESSMENT: MAJOR ELECTROLYTE    Merita Norton 09/15/2012, 6:09 AM

## 2012-09-15 NOTE — Progress Notes (Signed)
Subjective:  Patient has mild dyspnea, but no new symptoms.  Objective:  Vital Signs in the last 24 hours: Temp:  [97.6 F (36.4 C)-98.6 F (37 C)] 97.6 F (36.4 C) (07/16 1621) Pulse Rate:  [103-158] 109 (07/16 1621) Resp:  [19-29] 25 (07/16 1621) BP: (83-123)/(57-83) 109/76 mmHg (07/16 1621) SpO2:  [94 %-100 %] 100 % (07/16 1621) Weight:  [118.1 kg (260 lb 5.8 oz)-120 kg (264 lb 8.8 oz)] 120 kg (264 lb 8.8 oz) (07/16 1621)  Intake/Output from previous day: 07/15 0701 - 07/16 0700 In: 1818.5 [P.O.:480; I.V.:178.5; NG/GT:660; IV Piggyback:500] Out: 1595 [Urine:1595]  Physical Exam: General appearance: alert, cooperative, appears older than stated age, no distress and moderately obese  Lungs: wheezes bibasilar and bilaterally  Heart: S1, S2 normal, S3 present and no rub. Tachycardia present. Abdomen: soft, non-tender; bowel sounds normal; no masses, no organomegaly  Extremities: Bilateral below knee 2 plus edema present, left leg has cellulitis changes in his shin.  Upper extremity also show edema, mild 1 plus pitting. Pulses: 2+ and symmetric  Neurologic: Grossly normal  Lab Results:  Recent Labs  09/15/12 0507 09/15/12 1059  WBC 9.9 12.2*  HGB 13.8 14.8  PLT 45* 48*    Recent Labs  09/14/12 0400 09/15/12 0507  NA 149* 151*  K 3.0* 2.9*  CL 105 107  CO2 34* 35*  GLUCOSE 134* 79  BUN 39* 37*  CREATININE 1.40* 1.17    Recent Labs  09/13/12 1500  TROPONINI <0.30   Hepatic Function Panel  Recent Labs  09/13/12 0435  PROT 5.4*  ALBUMIN 1.4*  AST 21  ALT 23  ALKPHOS 72  BILITOT 2.9*    EKG: I administered a total of 24 mg of intravenous adenosine 12 mg x2 to see whether patient has underlying atrial flutter with 2:1conduction due to persistent tachycardia. EKG revealed essentially sinus tachycardia.  Echo 09/13/2012: Left ventricle: The cavity size was severely dilated. Systolic function was severely reduced. The estimated ejection fraction was in the  range of 20% to 25%. Cannot exclude apical thrombus. - Left atrium: The atrium was mildly dilated. - Right ventricle: The cavity size was moderately dilated. - Right atrium: The atrium was mildly dilated. IVC dilated, suggests elevated right heart pressure.    Assessment/Plan:  1. Dilated cardiomyopathy, severe LV systolic dysfunction probably chronic. Left ventricular mural thrombus could not be excluded by transthoracic echocardiogram.  2. Acute on chronic systolic and diastolic heart failure.  3. Abnormal EKG, revealing left atrial abnormality, right bundle branch block, cannot exclude inferior MI old,  sinus tachycardia. 4. Diabetes mellitus type 2 uncontrolled.  5. Chronic renal insufficiency  6. Thrombocytopenia  Recommendation: Patient has mild generalized anasarca, also has congestive heart failure. Administration of dopamine or dobutamine is probably not a good option due to persistent sinus tachycardia. We will continue present dose medications, and hopefully addition of Bidil and digoxin should have with CHF. Normal changes in the medications were done today.   Pamella Pert, M.D. 09/15/2012, 6:54 PM [facility] Pager: [phone] Office: [phone] If no answer: [phone]

## 2012-09-15 NOTE — Progress Notes (Signed)
PULMONARY  / CRITICAL CARE MEDICINE  Name: Caleb Bauer MRN: [id number] DOB: November 18, 1950    ADMISSION DATE:  09/10/2012 CONSULTATION DATE:  09/11/2012  REFERRING MD :  Riley Lam PRIMARY SERVICE: PCCM  CHIEF COMPLAINT:  Sepsis, respiratory failure  BRIEF PATIENT DESCRIPTION: 62 y/o male with DM2, CHF was admitted on 7/11 by TRH to [facility] for cellulitis of his left leg.  On 7/12 he developed respiratory failure and acute encephalopathy requiring mechanical ventilation.  SIGNIFICANT EVENTS / STUDIES:  7/11 admission 7/12 transfer to ICU, intubation  7/14- neg 1.5 liters 7/15-off pressors, extubated  LINES / TUBES: 7/12 ETT >>7/15 7/12 R. IJ >> 7/12 R. RA>>7/15 7/13 NG >>7/15  CULTURES: 7/12 blood >> 7/12 c. Diff >> neg 7/12 urine>>>neg  ANTIBIOTICS: 7/12 clinda >>>7/15 7/12 vanc >>>7/15 7/12 Zosyn >>>  SUBJECTIVE: Patient awake and alert, responding to questions and commands  VITAL SIGNS: Temp:  [97.9 F (36.6 C)-99 F (37.2 C)] 97.9 F (36.6 C) (07/16 0400) Pulse Rate:  [28-158] 107 (07/16 0700) Resp:  [11-30] 23 (07/16 0700) BP: (78-117)/(57-77) 113/76 mmHg (07/16 0700) SpO2:  [86 %-100 %] 100 % (07/16 0700) FiO2 (%):  [40 %] 40 % (07/15 1207) Weight:  [260 lb 5.8 oz (118.1 kg)] 260 lb 5.8 oz (118.1 kg) (07/16 0459) HEMODYNAMICS:   VENTILATOR SETTINGS: Vent Mode:  [-] CPAP;PSV FiO2 (%):  [40 %] 40 % Set Rate:  [10 bmp] 10 bmp Vt Set:  [550 mL] 550 mL PEEP:  [5 cmH20] 5 cmH20 Pressure Support:  [5 cmH20] 5 cmH20 Plateau Pressure:  [20 cmH20] 20 cmH20 INTAKE / OUTPUT: Intake/Output     07/15 0701 - 07/16 0700 07/16 0701 - 07/17 0700   P.O. 480    I.V. (mL/kg) 168.5 (1.4)    NG/GT 660    IV Piggyback 500    Total Intake(mL/kg) 1808.5 (15.3)    Urine (mL/kg/hr) 1595 (0.6)    Total Output 1595     Net +213.5          Stool Occurrence 2 x      PHYSICAL EXAMINATION: General: awake, alert, responds to questions and commands Neuro: Rass 0 HEENT: trach  scar PULM: CTA CV:  s1 s2 rrr no r GI: soft, bs wnl , no r/g Extremities: edema, extensive, left leg wound, suture area clean, no drainage or signs of infection, R and L hands swollen, patient unable to make fist, L hand erythematous and warm, patient moves L hand slowly due to pain  LABS:  Recent Labs Lab 09/10/12 1710  09/11/12 0545  09/11/12 1940  09/12/12 0500  09/12/12 1200 09/12/12 1356 09/12/12 1558 09/13/12 0325 09/13/12 0435 09/13/12 1500 09/13/12 1638 09/14/12 0400 09/15/12 0507  HGB 16.5  < >  --   --   --   --  14.7  --   --   --   --   --  15.4  --   --  15.7 13.8  WBC 7.2  < >  --   --   --   --  7.5  --   --   --   --   --  7.4  --   --  7.3 9.9  PLT 52*  < >  --   --   --   --  39*  --   --   --   --   --  62*  --   --  47* 45*  NA 140  < >  --   --   --   --  138  --   --  138  --   --  145  --   --  149* 151*  K 3.7  < >  --   --   --   --  3.8  --   --  3.8  --   --  3.3*  --   --  3.0* 2.9*  CL 97  < >  --   --   --   --  101  --   --  98  --   --  102  --   --  105 107  CO2 28  < >  --   --   --   --  30  --   --  27  --   --  34*  --   --  34* 35*  GLUCOSE 107*  < >  --   --   --   --  142*  --   --  112*  --   --  113*  --   --  134* 79  BUN 40*  < >  --   --   --   --  47*  --   --  49*  --   --  45*  --   --  39* 37*  CREATININE 1.29  < >  --   --   --   --  1.63*  --   --  1.84*  --   --  1.61*  --   --  1.40* 1.17  CALCIUM 8.7  < >  --   --   --   --  8.2*  --   --  8.2*  --   --  8.1*  --   --  8.2* 8.2*  MG  --   --  2.0  --   --   --   --   --   --   --   --   --  1.5  --   --   --  1.4*  PHOS  --   --   --   --   --   --   --   --   --   --   --   --  4.5  --   --   --  3.3  AST  --   --   --   --   --   --   --   --   --  27  --   --  21  --   --   --   --   ALT  --   --   --   --   --   --   --   --   --  26  --   --  23  --   --   --   --   ALKPHOS  --   --   --   --   --   --   --   --   --  72  --   --  72  --   --   --   --   BILITOT  --    --   --   --   --   --   --   --   --  2.4*  --   --  2.9*  --   --   --   --  PROT  --   --   --   --   --   --   --   --   --  5.5*  --   --  5.4*  --   --   --   --   ALBUMIN  --   --   --   --   --   --   --   --   --  1.6*  --   --  1.4*  --   --   --   --   INR  --   --   --   --   --   --   --   --  3.06*  --   --   --   --   --   --  1.70*  --   LATICACIDVEN  --   --   --   < >  --   < >  --   < > 2.4*  --   --   --  2.1  --   --   --  1.6  TROPONINI  --   --   --   --   --   --   --   --   --   --   --   --   --  <0.30  --   --   --   PROCALCITON  --   --   --   --  2.56  --   --   --   --  2.87  --   --  2.40  --   --   --   --   PROBNP 23656.0*  --   --   --   --   --   --   --   --   --   --   --   --   --   --   --   --   PHART  --   --   --   --   --   < >  --   --   --   --  7.453* 7.494*  --   --  7.494*  --   --   PCO2ART  --   --   --   --   --   < >  --   --   --   --  43.0 37.7  --   --  48.5*  --   --   PO2ART  --   --   --   --   --   < >  --   --   --   --  152.0* 138.0*  --   --  129.0*  --   --   < > = values in this interval not displayed.  Recent Labs Lab 09/14/12 1136 09/14/12 1646 09/14/12 1952 09/15/12 0011 09/15/12 0315  GLUCAP 120* 114* 115* 96 85    CXR: 7/15>>ett wnl, linme wnl, int edema unchaged, large heart  ASSESSMENT / PLAN:  PULMONARY  Recent Labs Lab 09/11/12 2246 09/12/12 0102 09/12/12 1558 09/13/12 0325 09/13/12 1638  PHART 7.280* 7.415 7.453* 7.494* 7.494*  PCO2ART 70.2* 49.5* 43.0 37.7 48.5*  PO2ART 88.0 144.0* 152.0* 138.0* 129.0*  HCO3 33.2* 31.9* 30.3* 28.7* 37.3*  TCO2 35 33 32 29.8 39  O2SAT 95.0 99.0 99.0 99.4 99.0    A: Acute hypercarbic respiratory failure due to decompensated  CHF and ?pneumonia?  Possible underlying hypoventilation syndrome (initial co2 chem 28-30), pulm edema P:   -allow neg balance to continue  -inhalers to albut/atrov neb q6 -No role acetazolamide -IS -will need oupt sleep study     CARDIOVASCULAR  Recent Labs Lab 09/10/12 1710  PROBNP 23656.0*    A: Acute decompensated CHF, uncertain LVEF Shock> presumably mixed cardiogenic and septic 712 Ven Dopp Negative for DVT 7/13-Bedside echo with biventricular depressed EF, dilated RA, pending formal 7/13-norepinephrine given, BP 105/76, P 99 7/14-EKG showed possible L atrial enlargement and new RBBB (no old ecg in emr) 7/15-EKG showed inc rate and probable a flutter  P:  -off pressors -echo -20%, sever dilated, cant exclude apical thrombus -cortisol 15 but no steroid given as off pressors -trop neg, ecg with ?new RBBB = will cardiomyopathy likely all related to dilated heart, at risk PE? 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 Anticoag not indicated due to severe osteopenia and compliance issues. Dr. Jacinto Halim began tx for dilated cardiomyopathy, acute decompensated sys and dia heart failure>>BB, bidil, digoxin. Cardiology will f/u with patient as well. -reduce or dc beta blocker as hypotension over night -consider lower MAp goals with EF 20%  RENAL  Recent Labs Lab 09/11/12 0510 09/11/12 0545 09/12/12 0500 09/12/12 1356 09/13/12 0435 09/14/12 0400 09/15/12 0507  NA 141  --  138 138 145 149* 151*  K 3.8  --  3.8 3.8 3.3* 3.0* 2.9*  CL 97  --  101 98 102 105 107  CO2 30  --  30 27 34* 34* 35*  GLUCOSE 120*  --  142* 112* 113* 134* 79  BUN 43*  --  47* 49* 45* 39* 37*  CREATININE 1.43*  --  1.63* 1.84* 1.61* 1.40* 1.17  CALCIUM 8.7  --  8.2* 8.2* 8.1* 8.2* 8.2*  MG  --  2.0  --   --  1.5  --  1.4*  PHOS  --   --   --   --  4.5  --  3.3    A:  ARF improving, hypokalemia, hypernatremia P:   -monitor UOP  -strict i/o  -continue to replace electrolytes K today -hold further lasix, allow auto diuresis for now but add free water>>free water deficit calc today= 5.6 L -avoid  saline -add d5w at 75 cc/hr -recheck k in afternoon  GASTROINTESTINAL A:  Bm noted7/15>>TF discontinued P:   -Pantoprazole for stress ulcer prophylaxis -consult speech for swallow test to determine if patient can begin po diet  HEMATOLOGIC A:  Thrombocytopenia, uncertain etiology; likely from sepsis, coagulapathy frmo sepsis likely, r/lo apical thrombus Fibrinogen 292, unlikley dic P:  -SCD's in place on right leg -Fibrinogen WNL 292 -sent HITT assay, low suspicion, no DTI for now, pending -trend plat still  INFECTIOUS  Recent Labs Lab 09/13/12 0435 09/14/12 0400 09/15/12 0507  HGB 15.4 15.7 13.8  HCT 44.8 46.8 41.4  WBC 7.4 7.3 9.9  PLT 62* 47* 45*    Recent Labs Lab 09/11/12 1940 09/12/12 1356 09/13/12 0435  PROCALCITON 2.56 2.87 2.40   A:  Cellulitis left leg, L and R hands swollen and erythematous -gout? Not warm P:   -Maintain Zosyn, add stop date -dc vanc, clinda -xray hand, empiric steroids for gout for now -surgery following  ENDOCRINE  Recent Labs Lab 09/14/12 1136 09/14/12 1646 09/14/12 1952 09/15/12 0011 09/15/12 0315  GLUCAP 120* 114* 115* 96 85   A:  DM2 P:   -SSI  NEUROLOGIC A:  Acute encephalopathy most likely due to hypercapnea  P:   -WUA PRNs -cam neg  TODAY'S SUMMARY: swallow test, continue free water,eval hand swelling, to sdu, [facility]  Leo Rod, PA-S  I have fully examined this patient and agree with above findings.    And edited in full  Mcarthur Rossetti. Tyson Alias, MD, FACP Pgr: [phone] [facility] [facility]

## 2012-09-15 NOTE — Progress Notes (Signed)
NUTRITION FOLLOW UP  Intervention:   Glucerna Shake po TID, each supplement provides 220 kcal and 10 grams of protein.  Nutrition Dx:   Inadequate oral intake now related to decreased appetite as evidenced by meal completion of 50%.   Goal:   Enteral nutrition to provide 60-70% of estimated calorie needs (22-25 kcals/kg ideal body weight) and 100% of estimated protein needs, based on ASPEN guidelines for permissive underfeeding in critically ill obese individuals; na  New Goal: Pt to meet >/= 90% of their estimated nutrition needs  Monitor:   PO intake, supplement acceptance, weight trend  Assessment:   Pt with DM2 and CHF was admitted on 7/11 by TRH to [facility] for cellulitis of his left leg. On 7/12 he developed respiratory failure and acute encephalopathy requiring mechanical ventilation. Pt also with stage II on buttocks. Potassium decreased, being replaced IV.  Pt extubated 7/15. Pt discussed during ICU rounds and with RN.  Pt reports decreased appetite PTA. Pt unsure of weight loss but states usual weight of 271 lb, which would indicate weight loss of 10 lb question if fluid related.    Height: Ht Readings from Last 1 Encounters:  09/10/12 6\' 1"  (1.854 m)    Weight Status:   Wt Readings from Last 1 Encounters:  09/15/12 260 lb 5.8 oz (118.1 kg)  Admission weight 271 lb 7/11 - pt being diuresed  Re-estimated needs:  Kcal: 2200-2400 Protein: 105-115 grams Fluid: > 2 L/day  Skin: stage II buttocks, cellulitis   Diet Order: NPO   Intake/Output Summary (Last 24 hours) at 09/15/12 1104 Last data filed at 09/15/12 0700  Gross per 24 hour  Intake 1229.7 ml  Output   1320 ml  Net  -90.3 ml    Last BM: 7/15   Labs:   Recent Labs Lab 09/11/12 0545  09/12/12 1356 09/13/12 0435 09/14/12 0400 09/15/12 0507  NA  --   < > 138 145 149* 151*  K  --   < > 3.8 3.3* 3.0* 2.9*  CL  --   < > 98 102 105 107  CO2  --   < > 27 34* 34* 35*  BUN  --   < > 49* 45* 39* 37*   CREATININE  --   < > 1.84* 1.61* 1.40* 1.17  CALCIUM  --   < > 8.2* 8.1* 8.2* 8.2*  MG 2.0  --   --  1.5  --  1.4*  PHOS  --   --   --  4.5  --  3.3  GLUCOSE  --   < > 112* 113* 134* 79  < > = values in this interval not displayed.  CBG (last 3)   Recent Labs  09/15/12 0011 09/15/12 0315 09/15/12 0845  GLUCAP 96 85 71    Scheduled Meds: . albuterol  2.5 mg Nebulization Q6H  . aspirin  81 mg Oral Daily  . digoxin  0.125 mg Oral Daily  . insulin aspart  2-6 Units Subcutaneous Q4H  . ipratropium  0.5 mg Nebulization Q6H  . isosorbide-hydrALAZINE  1 tablet Oral TID  . magnesium sulfate LVP 250-500 ml  6 g Intravenous Once  . piperacillin-tazobactam  3.375 g Intravenous Q8H  . potassium chloride  10 mEq Intravenous Q1H  . sodium chloride  3 mL Intravenous Q12H    Continuous Infusions: . sodium chloride 500 mL (09/15/12 0734)  . norepinephrine (LEVOPHED) Adult infusion Stopped (09/15/12 0400)    Kendell Bane RD, LDN, CNSC  [phone] Pager [phone] After Hours Pager

## 2012-09-16 ENCOUNTER — Inpatient Hospital Stay (HOSPITAL_COMMUNITY): Payer: Medicaid Other

## 2012-09-16 LAB — MAGNESIUM
Magnesium: 2.3 mg/dL (ref 1.5–2.5)
Magnesium: 2.2 mg/dL (ref 1.5–2.5)

## 2012-09-16 LAB — PHOSPHORUS: Phosphorus: 2.9 mg/dL (ref 2.3–4.6)

## 2012-09-16 LAB — BASIC METABOLIC PANEL
BUN: 31 mg/dL — ABNORMAL HIGH (ref 6–23)
CO2: 36 mEq/L — ABNORMAL HIGH (ref 19–32)
Calcium: 8 mg/dL — ABNORMAL LOW (ref 8.4–10.5)
Chloride: 103 mEq/L (ref 96–112)
Creatinine, Ser: 0.98 mg/dL (ref 0.50–1.35)

## 2012-09-16 LAB — GLUCOSE, CAPILLARY: Glucose-Capillary: 175 mg/dL — ABNORMAL HIGH (ref 70–99)

## 2012-09-16 MED ORDER — TORSEMIDE 20 MG PO TABS
20.0000 mg | ORAL_TABLET | Freq: Two times a day (BID) | ORAL | Status: DC
  Administered 2012-09-16 – 2012-09-27 (×22): 20 mg via ORAL
  Filled 2012-09-16 (×25): qty 1

## 2012-09-16 MED ORDER — POTASSIUM CHLORIDE CRYS ER 20 MEQ PO TBCR
40.0000 meq | EXTENDED_RELEASE_TABLET | Freq: Once | ORAL | Status: AC
  Administered 2012-09-16: 40 meq via ORAL
  Filled 2012-09-16: qty 2

## 2012-09-16 MED ORDER — POTASSIUM CHLORIDE CRYS ER 20 MEQ PO TBCR
20.0000 meq | EXTENDED_RELEASE_TABLET | Freq: Two times a day (BID) | ORAL | Status: DC
  Administered 2012-09-16 – 2012-09-27 (×22): 20 meq via ORAL
  Filled 2012-09-16 (×23): qty 1

## 2012-09-16 MED ORDER — METOPROLOL TARTRATE 12.5 MG HALF TABLET
12.5000 mg | ORAL_TABLET | Freq: Two times a day (BID) | ORAL | Status: DC
  Administered 2012-09-16 – 2012-09-27 (×21): 12.5 mg via ORAL
  Filled 2012-09-16 (×23): qty 1

## 2012-09-16 NOTE — Progress Notes (Signed)
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

## 2012-09-16 NOTE — Progress Notes (Signed)
PULMONARY  / CRITICAL CARE MEDICINE  Name: Caleb Bauer MRN: [id number] DOB: 28-Aug-1950    ADMISSION DATE:  09/10/2012 CONSULTATION DATE:  09/11/2012  REFERRING MD :  Riley Lam PRIMARY SERVICE: PCCM  CHIEF COMPLAINT:  Sepsis, respiratory failure  BRIEF PATIENT DESCRIPTION: 62 y/o male with DM2, CHF was admitted on 7/11 by TRH to [facility] for cellulitis of his left leg.  On 7/12 he developed respiratory failure and acute encephalopathy requiring mechanical ventilation.  SIGNIFICANT EVENTS / STUDIES:  7/11 admission 7/12 transfer to ICU, intubation  7/12 Ven Dopp Negative for DVT 7/14- neg 1.5 liters 7/15-off pressors, extubated 7/17 - NSVT  LINES / TUBES: 7/12 ETT >>7/15 7/12 R. IJ >> 7/12 R. RA>>7/15 7/13 NG >>7/15  CULTURES: 7/12 blood >> 7/12 c. Diff >> neg 7/12 urine>>>neg  ANTIBIOTICS: 7/12 clinda >>>7/15 7/12 vanc >>>7/15 7/12 Zosyn >>>  SUBJECTIVE: Patient awake and alert, responding to questions and commands  VITAL SIGNS: Temp:  [97.5 F (36.4 C)-98.1 F (36.7 C)] 98 F (36.7 C) (07/17 0808) Pulse Rate:  [100-120] 118 (07/17 0957) Resp:  [17-30] 25 (07/17 0808) BP: (91-123)/(60-81) 100/69 mmHg (07/17 0808) SpO2:  [97 %-100 %] 98 % (07/17 0808) Weight:  [120 kg (264 lb 8.8 oz)] 120 kg (264 lb 8.8 oz) (07/16 1621) INTAKE / OUTPUT: Intake/Output     07/16 0701 - 07/17 0700 07/17 0701 - 07/18 0700   P.O. 790 900   I.V. (mL/kg) 394.3 (3.3) 40 (0.3)   NG/GT     IV Piggyback 400    Total Intake(mL/kg) 1584.3 (13.2) 940 (7.8)   Urine (mL/kg/hr) 1051 (0.4) 50 (0.1)   Total Output 1051 50   Net +533.3 +890          PHYSICAL EXAMINATION: General: awake, alert, responds to questions and commands Neuro: Rass 0, MAE, pain in left leg HEENT: atraumatic, neck supple, no masses, PERRLA PULM: CTA bilaterally, no wheezes CV:  s1 s2 rrr no r, edema in both upper and lower ext +3 GI: soft, bs wnl , no r/g Extremities: left leg wound and surrounding cellulitis.  Bilateral hands swollen and edematous   LABS: CBC Recent Labs     09/14/12  0400  09/15/12  0507  09/15/12  1059  WBC  7.3  9.9  12.2*  HGB  15.7  13.8  14.8  HCT  46.8  41.4  43.0  PLT  47*  45*  48*    Coag's Recent Labs     09/14/12  0400  INR  1.70*    BMET Recent Labs     09/15/12  0507  09/15/12  1930  09/16/12  0310  NA  151*  144  142  K  2.9*  3.4*  3.7  CL  107  104  103  CO2  35*  36*  36*  BUN  37*  32*  31*  CREATININE  1.17  1.00  0.98  GLUCOSE  79  85  105*    Electrolytes Recent Labs     09/15/12  0507  09/15/12  1800  09/15/12  1930  09/16/12  0310  CALCIUM  8.2*   --   7.9*  8.0*  MG  1.4*  2.3   --   2.2  PHOS  3.3   --    --   2.9    Sepsis Markers No results found for this basename: LACTICACIDVEN, PROCALCITON, O2SATVEN,  in the last 72 hours  ABG Recent Labs  09/13/12  1638  PHART  7.494*  PCO2ART  48.5*  PO2ART  129.0*    Liver Enzymes No results found for this basename: AST, ALT, ALKPHOS, BILITOT, ALBUMIN,  in the last 72 hours  Cardiac Enzymes Recent Labs     09/13/12  1500  09/16/12  0310  09/16/12  1002  TROPONINI  <0.30  <0.30  <0.30    Glucose Recent Labs     09/15/12  0011  09/15/12  0315  09/15/12  0845  09/15/12  1215  09/15/12  1624  09/15/12  2031  GLUCAP  96  85  71  73  85  73    Imaging Dg Chest Port 1 View  09/16/2012   *RADIOLOGY REPORT*  Clinical Data: Shortness of breath, chest pain, history CHF, hypertension, diabetes, dilated cardiomyopathy  PORTABLE CHEST - 1 VIEW  Comparison: Portable exam 0546 hours compared to 09/14/2012  Findings: Endotracheal nasogastric tubes no longer visualized. Tip of right jugular central venous catheter projects over SVC. Enlargement of cardiac silhouette with pulmonary vascular congestion. Diffuse bilateral infiltrates compatible pulmonary edema and CHF. Coexistent atelectasis versus consolidation of the left lower lobe persists. Component of left pleural  effusion questioned. No pneumothorax.  IMPRESSION: Persistent pulmonary edema/CHF. Persistent atelectasis versus consolidation of left lower lobe.   Original Report Authenticated By: Ulyses Southward, M.D.   Dg Hand Complete Left  09/15/2012   *RADIOLOGY REPORT*  Clinical Data: Left hand pain, swelling, possible fracture  LEFT HAND - COMPLETE 3+ VIEW  Comparison: None.  Findings: Three views of the left hand submitted.  There is soft tissue swelling metacarpal region.  No acute fracture or subluxation.  Degenerative changes interphalangeal joint of the thumb.  IMPRESSION:  There is soft tissue swelling metacarpal region.  No acute fracture or subluxation.  Degenerative changes interphalangeal joint of the thumb.   Original Report Authenticated By: Natasha Mead, M.D.       ASSESSMENT / PLAN:  PULMONARY  A: Acute hypercarbic respiratory failure due to decompensated CHF and ?pneumonia?  Possible underlying hypoventilation syndrome (initial co2 chem 28-30), pulm edema P:   -Goal of negative fluid balance  -Nebs Q6H -Pulmonary hygiene -IS ordered -OOB as able -Sleep study as outpatient    CARDIOVASCULAR A: Acute decompensated CHF, Shock> presumably mixed cardiogenic and septic - resolving, off pressors NSVT - multiple runs of VTACH overnight 7/16-7/17. Troponin negative thus far 7/13-Bedside echo with biventricular depressed EF, dilated RA, pending formal 7/13-norepinephrine given, BP 105/76, P 99 7/14-EKG showed possible L atrial enlargement and new RBBB (no old ecg in emr) 7/15-EKG showed inc rate and probable a flutter  P:  -Continue telemetry monitoring -Cardiology following -Continue Dig -Target mag greater than 2.3-2.5  RENAL A:  ARF improving - resolved Hypokalemia Hypernatremia - resolved P:   -monitor UOP  -strict i/o  -continue to replace electrolytes today -2G mag now -follow bmp and mag in AM  GASTROINTESTINAL A:  No acute issues currently P:   -ADA  diet  HEMATOLOGIC A:  Thrombocytopenia, uncertain etiology; stable, likely from sepsis, no signs of bleeding P:  -SCD's for DVT px -Follow CBC  INFECTIOUS A:  Cellulitis left leg - drained by surgery 7/13 P:   -ABX as above -Surgery following for cellulitis  ENDOCRINE A:  DM2 P:   -SSI  NEUROLOGIC A:  Acute encephalopathy most likely due to hypercapnea - resolved P:   -Supportive care -Avoid benzos -OOB and ambulate -PT  RHEUMATOLOGY A: b/l hand swelling/erythema >> xray  hands negative. P: -continue prednisone -check ANA, rheumatoid factor  TODAY'S SUMMARY: Multiple episodes of NSVT overnight and this morning. Patient asymptomatic throughout. Cardiology following. Pushing mag goal to 2.3-2.5.  Richard A. Rosebrock, S-ACNP  Reviewed above, examined pt, and agree with assessment/plan.  Will transfer to [facility] for 7/18 and PCCM sign off.  Coralyn Helling, MD [facility] 09/16/2012, 12:25 PM Pager:  [phone] After 3pm call: [phone]

## 2012-09-16 NOTE — Progress Notes (Signed)
RN notified of pt having 14 beat run of v-tach.  HR has been in 110s.  BP 129/88, pt asymptomatic.  Craige Cotta, MD notified.

## 2012-09-16 NOTE — Progress Notes (Signed)
eLink Physician-Brief Progress Note Patient Name: Caleb Bauer DOB: [birth year] MRN: [id number]  Date of Service  09/16/2012   HPI/Events of Note    Recent Labs Lab 09/11/12 0545  09/12/12 1356 09/13/12 0435 09/14/12 0400 09/15/12 0507 09/15/12 1800 09/15/12 1930 09/16/12 0310  NA  --   < > 138 145 149* 151*  --  144 142  K  --   < > 3.8 3.3* 3.0* 2.9*  --  3.4* 3.7  CL  --   < > 98 102 105 107  --  104 103  CO2  --   < > 27 34* 34* 35*  --  36* 36*  GLUCOSE  --   < > 112* 113* 134* 79  --  85 105*  BUN  --   < > 49* 45* 39* 37*  --  32* 31*  CREATININE  --   < > 1.84* 1.61* 1.40* 1.17  --  1.00 0.98  CALCIUM  --   < > 8.2* 8.1* 8.2* 8.2*  --  7.9* 8.0*  MG 2.0  --   --  1.5  --  1.4* 2.3  --  2.2  PHOS  --   --   --  4.5  --  3.3  --   --  2.9  < > = values in this interval not displayed.   Recent Labs Lab 09/13/12 1500 09/16/12 0310  TROPONINI <0.30 <0.30     eICU Interventions  Mild low k  Plan  -due to non sustained v tach will give KCL to get K > 4   Intervention Category Minor Interventions: Electrolytes abnormality - evaluation and management  Shylyn Younce 09/16/2012, 5:45 AM

## 2012-09-16 NOTE — Progress Notes (Signed)
Subjective:  Patient has mild dyspnea, but no new symptoms. Patient has had episodes of nonsustained ventricular tachycardia last night and earlier this morning.  Asymptomatic.  Objective:  Vital Signs in the last 24 hours: Temp:  [97.5 F (36.4 C)-98.6 F (37 C)] 98.6 F (37 C) (07/17 1658) Pulse Rate:  [100-120] 113 (07/17 1758) Resp:  [17-30] 29 (07/17 1658) BP: (91-129)/(56-90) 108/56 mmHg (07/17 1758) SpO2:  [97 %-100 %] 98 % (07/17 1658)  Intake/Output from previous day: 07/16 0701 - 07/17 0700 In: 1584.3 [P.O.:790; I.V.:394.3; IV Piggyback:400] Out: 1051 [Urine:1051]  Physical Exam: General appearance: alert, cooperative, appears older than stated age, no distress and moderately obese  Lungs: wheezes bibasilar and bilaterally  Heart: S1, S2 normal, S3 present and no rub. Tachycardia present. Abdomen: soft, non-tender; bowel sounds normal; no masses, no organomegaly  Extremities: Bilateral below knee 2 plus edema present, left leg has cellulitis changes in his shin.  Upper extremity also show edema, mild 1 plus pitting. Pulses: 2+ and symmetric  Neurologic: Grossly normal  Lab Results:  Recent Labs  09/15/12 0507 09/15/12 1059  WBC 9.9 12.2*  HGB 13.8 14.8  PLT 45* 48*    Recent Labs  09/15/12 1930 09/16/12 0310  NA 144 142  K 3.4* 3.7  CL 104 103  CO2 36* 36*  GLUCOSE 85 105*  BUN 32* 31*  CREATININE 1.00 0.98    Recent Labs  09/16/12 0310 09/16/12 1002  TROPONINI <0.30 <0.30    09/15/2012: EKG: I administered a total of 24 mg of intravenous adenosine 12 mg x2 to see whether patient has underlying atrial flutter with 2:1conduction due to persistent tachycardia. EKG revealed essentially sinus tachycardia.  Echo 09/13/2012: Left ventricle: The cavity size was severely dilated. Systolic function was severely reduced. The estimated ejection fraction was in the range of 20% to 25%. Cannot exclude apical thrombus. - Left atrium: The atrium was mildly  dilated. - Right ventricle: The cavity size was moderately dilated. - Right atrium: The atrium was mildly dilated. IVC dilated, suggests elevated right heart pressure.    Assessment/Plan:  1. Dilated cardiomyopathy, severe LV systolic dysfunction probably chronic. Left ventricular mural thrombus could not be excluded by transthoracic echocardiogram.  2. Acute on chronic systolic and diastolic heart failure.  3. NSVT 4. Diabetes mellitus type 2 uncontrolled.  5. Chronic renal insufficiency  6. Thrombocytopenia 7. Sepsis  Recommendation: Patient has mild generalized anasarca, also has congestive heart failure. Administration of dopamine or dobutamine is probably not a good option due to persistent sinus tachycardia.  I have added Metoprolol 12.5 mb BID for now. I will add torsemide instead of Lasix due to anasarca and suspect bowel edema. F/U UOP and BNP tomorrow and if no good UOP, consider Milrinone. Pulmonary issues and cardiac issues are predominating the picture. Not a candidate for ICD due to compliance issues and sepsis.   Pamella Pert, M.D. 09/16/2012, 6:23 PM [facility] Pager: [phone] Office: [phone] If no answer: [phone]

## 2012-09-16 NOTE — Significant Event (Signed)
Called by RN. HIT panel is "weakly positive". Pt is not on any heparins currently. Further mgmt by bedside MD 7/18

## 2012-09-16 NOTE — Progress Notes (Signed)
Called e-link spoke to [facility] will notify Dr. Marchelle Gearing of the lab. result in the computer.

## 2012-09-16 NOTE — Progress Notes (Signed)
Paged Jacinto Halim, MD regarding pt HR sustaining in the 110s.  BP 113/90.  Pt has also had a 14 beat run of v-tach today, asymptomatic.  MD to order lopressor po 12.5mg  BID.  Will continue to monitor.  Salomon Mast, RN

## 2012-09-16 NOTE — Progress Notes (Signed)
eLink Physician-Brief Progress Note Patient Name: JARTAVIOUS MCKIMMY DOB: 21-Feb-1951 MRN: [id number]  Date of Service  09/16/2012   HPI/Events of Note   nnon sustatined V tach  eICU Interventions  Check troponn, bmet, phis, mag   Intervention Category Intermediate Interventions: Arrhythmia - evaluation and management  Eulamae Greenstein 09/16/2012, 2:44 AM

## 2012-09-16 NOTE — Progress Notes (Signed)
Pt. Had 2x episode of non-sustained v-tach while sleeping 6-7 beat run. Pt. Asymptomatic, Dr. Marchelle Gearing made aware with orders made. Due am labs drawn including troponin, will cont. to follow up result.

## 2012-09-17 ENCOUNTER — Inpatient Hospital Stay (HOSPITAL_COMMUNITY): Payer: Medicaid Other

## 2012-09-17 DIAGNOSIS — Z8679 Personal history of other diseases of the circulatory system: Secondary | ICD-10-CM | POA: Diagnosis not present

## 2012-09-17 DIAGNOSIS — L539 Erythematous condition, unspecified: Secondary | ICD-10-CM | POA: Diagnosis not present

## 2012-09-17 DIAGNOSIS — R600 Localized edema: Secondary | ICD-10-CM | POA: Diagnosis not present

## 2012-09-17 DIAGNOSIS — R748 Abnormal levels of other serum enzymes: Secondary | ICD-10-CM | POA: Diagnosis present

## 2012-09-17 DIAGNOSIS — R579 Shock, unspecified: Secondary | ICD-10-CM | POA: Diagnosis present

## 2012-09-17 DIAGNOSIS — R609 Edema, unspecified: Secondary | ICD-10-CM

## 2012-09-17 DIAGNOSIS — R0689 Other abnormalities of breathing: Secondary | ICD-10-CM | POA: Diagnosis present

## 2012-09-17 LAB — HEPARIN INDUCED THROMBOCYTOPENIA PNL
Patient O.D.: 0.461
UFH High Dose UFH H: 2 % Release
UFH Low Dose 0.1 IU/mL: 0 % Release
UFH Low Dose 0.5 IU/mL: 0 % Release

## 2012-09-17 LAB — PHOSPHORUS: Phosphorus: 3.7 mg/dL (ref 2.3–4.6)

## 2012-09-17 LAB — HEPATIC FUNCTION PANEL
Albumin: 1.3 g/dL — ABNORMAL LOW (ref 3.5–5.2)
Total Protein: 5.5 g/dL — ABNORMAL LOW (ref 6.0–8.3)

## 2012-09-17 MED ORDER — AMIODARONE HCL 200 MG PO TABS: 200.0000 mg | ORAL_TABLET | Freq: Every day | ORAL | Status: DC

## 2012-09-17 MED ORDER — AMIODARONE HCL 200 MG PO TABS
400.0000 mg | ORAL_TABLET | Freq: Three times a day (TID) | ORAL | Status: DC
  Administered 2012-09-17 – 2012-09-19 (×10): 400 mg via ORAL
  Filled 2012-09-17 (×13): qty 2

## 2012-09-17 MED ORDER — LISINOPRIL 5 MG PO TABS
5.0000 mg | ORAL_TABLET | Freq: Every day | ORAL | Status: DC
  Administered 2012-09-17 – 2012-09-19 (×2): 5 mg via ORAL
  Filled 2012-09-17 (×3): qty 1

## 2012-09-17 NOTE — Evaluation (Signed)
Physical Therapy Evaluation Patient Details Name: Caleb Bauer MRN: [id number] DOB: 12/05/1950 Today's Date: 09/17/2012 Time: 0272-5366 PT Time Calculation (min): 30 min  PT Assessment / Plan / Recommendation History of Present Illness  62 y/o male with DM2, CHF was admitted on 7/11 for cellulitis of his left leg.  On 7/12 he developed respiratory failure and acute encephalopathy requiring mechanical ventilation. He developed hypotension that was felt to be due to sepsis-PCT was ~ 2 and all cultures have been negative. Subsequent ECHO revealed severe LV systolic dysfunction with ejection fraction of 20-25% with global hypokinesis and markedly dilated left ventricle. Also question of apical mural thrombus. It was surmised he likely had cardiogenic shock.  Clinical Impression  Pt admitted with above. Pt currently with functional limitations due to the deficits listed below (see PT Problem List).  Pt will benefit from skilled PT to increase their independence and safety with mobility to allow discharge to the venue listed below.       PT Assessment  Patient needs continued PT services    Follow Up Recommendations  SNF    Does the patient have the potential to tolerate intense rehabilitation      Barriers to Discharge Decreased caregiver support      Equipment Recommendations  None recommended by PT    Recommendations for Other Services OT consult   Frequency Min 3X/week    Precautions / Restrictions Precautions Precautions: Fall   Pertinent Vitals/Pain Pain in lt hand.      Mobility  Bed Mobility Bed Mobility: Supine to Sit;Sitting - Scoot to Edge of Bed;Sit to Supine Supine to Sit: 3: Mod assist;HOB elevated Sitting - Scoot to Edge of Bed: 4: Min assist Sit to Supine: 3: Mod assist;HOB elevated Details for Bed Mobility Assistance: Assist to bring trunk up and to manage legs. Transfers Transfers: Sit to Stand;Stand to Sit Sit to Stand: 3: Mod assist;With upper extremity  assist;From bed;From elevated surface Stand to Sit: 4: Min assist;With upper extremity assist;To bed Details for Transfer Assistance: Assist to bring hips up. Ambulation/Gait Ambulation/Gait Assistance: 3: Mod assist Ambulation Distance (Feet): 2 Feet Assistive device: Rolling walker Ambulation/Gait Assistance Details: Difficulty grasping walker firmly due to edematous hands. Gait Pattern: Step-to pattern;Decreased step length - right;Decreased step length - left;Shuffle    Exercises     PT Diagnosis: Difficulty walking;Generalized weakness  PT Problem List: Decreased strength;Decreased activity tolerance;Decreased balance;Decreased mobility;Obesity PT Treatment Interventions: DME instruction;Gait training;Functional mobility training;Therapeutic activities;Therapeutic exercise;Balance training;Patient/family education     PT Goals(Current goals can be found in the care plan section) Acute Rehab PT Goals Patient Stated Goal: Return to home PT Goal Formulation: With patient Time For Goal Achievement: 10/01/12 Potential to Achieve Goals: Good  Visit Information  Last PT Received On: 09/17/12 Assistance Needed: +2 (for gait) History of Present Illness: 62 y/o male with DM2, CHF was admitted on 7/11 for cellulitis of his left leg.  On 7/12 he developed respiratory failure and acute encephalopathy requiring mechanical ventilation. He developed hypotension that was felt to be due to sepsis-PCT was ~ 2 and all cultures have been negative. Subsequent ECHO revealed severe LV systolic dysfunction with ejection fraction of 20-25% with global hypokinesis and markedly dilated left ventricle. Also question of apical mural thrombus. It was surmised he likely had cardiogenic shock.       Prior Functioning  Home Living Family/patient expects to be discharged to:: Private residence Living Arrangements: Other relatives Available Help at Discharge: Family;Available PRN/intermittently Home Access:  Stairs  to enter Entrance Stairs-Number of Steps: 3-4 Home Layout: One level Home Equipment: Cane - single point;Walker - 2 wheels Prior Function Level of Independence: Independent with assistive device(s) Communication Communication: No difficulties    Cognition  Cognition Arousal/Alertness: Awake/alert Behavior During Therapy: WFL for tasks assessed/performed Overall Cognitive Status: Within Functional Limits for tasks assessed    Extremity/Trunk Assessment Upper Extremity Assessment Upper Extremity Assessment: Defer to OT evaluation Lower Extremity Assessment Lower Extremity Assessment: Generalized weakness   Balance    End of Session PT - End of Session Activity Tolerance: Patient limited by fatigue Patient left: in bed;with call bell/phone within reach;Other (comment) (vascular lab present) Nurse Communication: Mobility status  GP     [facility] 09/17/2012, 3:12 PM  [facility] PT [phone]

## 2012-09-17 NOTE — Progress Notes (Signed)
Patient ID: Caleb Bauer, male   DOB: 02-18-1951, 62 y.o.   MRN: [id number]    Subjective: Patient awake. States left leg hurts in area of stitches, though is better than yesterday.  Objective: Vital signs in last 24 hours: Temp:  [98 F (36.7 C)-98.7 F (37.1 C)] 98.7 F (37.1 C) (07/18 0407) Pulse Rate:  [85-120] 85 (07/18 0407) Resp:  [20-30] 22 (07/18 0407) BP: (93-129)/(55-90) 98/65 mmHg (07/18 0407) SpO2:  [95 %-100 %] 99 % (07/18 0407) Last BM Date: 09/16/12  Intake/Output from previous day: 07/17 0701 - 07/18 0700 In: 2200 [P.O.:1500; I.V.:550; IV Piggyback:150] Out: 650 [Urine:650] Intake/Output this shift:    PE: Ext: LLE cellulitis and hemorrhagic changes improved based on previous report with less edema and erythema. Incision c/d/i with 2 sutures in place.   Lab Results:   Recent Labs  09/15/12 0507 09/15/12 1059  WBC 9.9 12.2*  HGB 13.8 14.8  HCT 41.4 43.0  PLT 45* 48*   BMET  Recent Labs  09/15/12 1930 09/16/12 0310  NA 144 142  K 3.4* 3.7  CL 104 103  CO2 36* 36*  GLUCOSE 85 105*  BUN 32* 31*  CREATININE 1.00 0.98  CALCIUM 7.9* 8.0*   PT/INR No results found for this basename: LABPROT, INR,  in the last 72 hours CMP     Component Value Date/Time   NA 142 09/16/2012 0310   K 3.7 09/16/2012 0310   CL 103 09/16/2012 0310   CO2 36* 09/16/2012 0310   GLUCOSE 105* 09/16/2012 0310   BUN 31* 09/16/2012 0310   CREATININE 0.98 09/16/2012 0310   CALCIUM 8.0* 09/16/2012 0310   PROT 5.4* 09/13/2012 0435   ALBUMIN 1.4* 09/13/2012 0435   AST 21 09/13/2012 0435   ALT 23 09/13/2012 0435   ALKPHOS 72 09/13/2012 0435   BILITOT 2.9* 09/13/2012 0435   GFRNONAA 87* 09/16/2012 0310   GFRAA >90 09/16/2012 0310   Lipase  No results found for this basename: lipase    Studies/Results: Dg Chest Port 1 View  09/16/2012   *RADIOLOGY REPORT*  Clinical Data: Shortness of breath, chest pain, history CHF, hypertension, diabetes, dilated cardiomyopathy  PORTABLE CHEST -  1 VIEW  Comparison: Portable exam 0546 hours compared to 09/14/2012  Findings: Endotracheal nasogastric tubes no longer visualized. Tip of right jugular central venous catheter projects over SVC. Enlargement of cardiac silhouette with pulmonary vascular congestion. Diffuse bilateral infiltrates compatible pulmonary edema and CHF. Coexistent atelectasis versus consolidation of the left lower lobe persists. Component of left pleural effusion questioned. No pneumothorax.  IMPRESSION: Persistent pulmonary edema/CHF. Persistent atelectasis versus consolidation of left lower lobe.   Original Report Authenticated By: Ulyses Southward, M.D.   Dg Hand Complete Left  09/15/2012   *RADIOLOGY REPORT*  Clinical Data: Left hand pain, swelling, possible fracture  LEFT HAND - COMPLETE 3+ VIEW  Comparison: None.  Findings: Three views of the left hand submitted.  There is soft tissue swelling metacarpal region.  No acute fracture or subluxation.  Degenerative changes interphalangeal joint of the thumb.  IMPRESSION:  There is soft tissue swelling metacarpal region.  No acute fracture or subluxation.  Degenerative changes interphalangeal joint of the thumb.   Original Report Authenticated By: Natasha Mead, M.D.    Anti-infectives: Anti-infectives   Start     Dose/Rate Route Frequency Ordered Stop   09/12/12 1000  vancomycin (VANCOCIN) 1,250 mg in sodium chloride 0.9 % 250 mL IVPB  Status:  Discontinued     1,250  mg 166.7 mL/hr over 90 Minutes Intravenous Every 12 hours 09/11/12 1951 09/14/12 1358   09/12/12 0600  piperacillin-tazobactam (ZOSYN) IVPB 3.375 g     3.375 g 12.5 mL/hr over 240 Minutes Intravenous 3 times per day 09/12/12 0504 09/21/12 2359   09/11/12 2000  vancomycin (VANCOCIN) 2,500 mg in sodium chloride 0.9 % 500 mL IVPB     2,500 mg 250 mL/hr over 120 Minutes Intravenous STAT 09/11/12 1951 09/11/12 2357   09/11/12 0600  clindamycin (CLEOCIN) IVPB 600 mg  Status:  Discontinued     600 mg 100 mL/hr over 30  Minutes Intravenous 3 times per day 09/10/12 2132 09/10/12 2207   09/11/12 0600  clindamycin (CLEOCIN) IVPB 900 mg  Status:  Discontinued     900 mg 100 mL/hr over 30 Minutes Intravenous 3 times per day 09/10/12 2207 09/14/12 1358   09/10/12 2000  clindamycin (CLEOCIN) IVPB 900 mg     900 mg 100 mL/hr over 30 Minutes Intravenous  Once 09/10/12 1947 09/10/12 2033       Assessment/Plan  1. LLE cellulitis with hemorrhagic changes 2. CHF  Plan: 1. No surgical intervention at this time. Appears to be improving slowly with current measures. Believe patient would benefit from mobilization to move some fluid out of his LE. Recommend PT. Suture to remain in for about 10 days. Will check on incision every couple of days.  2. Defer further care to CCM.  LOS: 7 days    Marikay Alar 09/17/2012, 7:37 AM

## 2012-09-17 NOTE — Progress Notes (Signed)
Left upper extremity venous duplex:  No evidence of DVT or superficial thrombosis.    

## 2012-09-17 NOTE — Progress Notes (Addendum)
Subjective:  Patient initially admitted to the hospital on 09/10/2012 with fall trying to get out of the bed and lacerating his foot leading to ulceration and cellulitis. Patient recently moved from [place] to [place] to live with his niece.  Patient has history of tobacco use. He has been diagnosed as having congestive heart failure about 2-3 years ago at [facility]. No history of prior cardiac evaluation with regard to invasive procedures. No history of illicit drug use. Patient states dyspnea improved, but no new symptoms. Patient has had recurrent episodes of nonsustained ventricular tachycardia last night and earlier this morning.  Asymptomatic. Patient was started on amiodarone last night, heart rate much better controlled this morning.  Objective:  Vital Signs in the last 24 hours: Temp:  [98.1 F (36.7 C)-98.7 F (37.1 C)] 98.1 F (36.7 C) (07/18 0814) Pulse Rate:  [85-118] 85 (07/18 0407) Resp:  [20-29] 22 (07/18 0407) BP: (93-129)/(55-90) 98/65 mmHg (07/18 0407) SpO2:  [95 %-100 %] 100 % (07/18 0822)  Intake/Output from previous day: 07/17 0701 - 07/18 0700 In: 2200 [P.O.:1500; I.V.:550; IV Piggyback:150] Out: 650 [Urine:650]  Physical Exam: General appearance: alert, cooperative, appears older than stated age, no distress and moderately obese  Lungs: wheezes bibasilar and bilaterally  Heart: S1, S2 normal, S3 present and no rub. Abdomen: soft, non-tender; bowel sounds normal; no masses, no organomegaly  Extremities: Bilateral below knee 2 plus edema present, left leg has cellulitis changes in his shin.  Upper extremity also show edema, mild 1 plus pitting. Pulses: 2+ and symmetric  Neurologic: Grossly normal  Lab Results:  Recent Labs  09/15/12 0507 09/15/12 1059  WBC 9.9 12.2*  HGB 13.8 14.8  PLT 45* 48*    Recent Labs  09/15/12 1930 09/16/12 0310  NA 144 142  K 3.4* 3.7  CL 104 103  CO2 36* 36*  GLUCOSE 85 105*  BUN 32* 31*   CREATININE 1.00 0.98    Recent Labs  09/16/12 1002 09/16/12 1805  TROPONINI <0.30 <0.30    09/15/2012: EKG: I administered a total of 24 mg of intravenous adenosine 12 mg x2 to see whether patient has underlying atrial flutter with 2:1conduction due to persistent tachycardia. EKG revealed essentially sinus tachycardia.  Echo 09/13/2012: Left ventricle: The cavity size was severely dilated. Systolic function was severely reduced. The estimated ejection fraction was in the range of 20% to 25%. Cannot exclude apical thrombus. - Left atrium: The atrium was mildly dilated. - Right ventricle: The cavity size was moderately dilated. - Right atrium: The atrium was mildly dilated. IVC dilated, suggests elevated right heart pressure. Scheduled Meds: . albuterol  2.5 mg Nebulization Q6H  . amiodarone  400 mg Oral TID  . aspirin  81 mg Oral Daily  . digoxin  0.125 mg Oral Daily  . feeding supplement  237 mL Oral TID BM  . ipratropium  0.5 mg Nebulization Q6H  . isosorbide-hydrALAZINE  1 tablet Oral TID  . metoprolol tartrate  12.5 mg Oral BID  . piperacillin-tazobactam  3.375 g Intravenous Q8H  . potassium chloride  20 mEq Oral BID  . predniSONE  40 mg Oral Q breakfast  . sodium chloride  3 mL Intravenous Q12H  . torsemide  20 mg Oral BID   Continuous Infusions: . sodium chloride Stopped (09/15/12 2138)   PRN Meds:.ondansetron (ZOFRAN) IV, ondansetron, sodium chloride   Assessment/Plan:  1. Dilated cardiomyopathy, severe LV systolic dysfunction probably chronic. Left ventricular mural thrombus could not be excluded by transthoracic  echocardiogram.  2. Acute on chronic systolic and diastolic heart failure.  3. NSVT now on amiodarone. Sinus tachycardia also improved, heart rate much better controlled. 4. Diabetes mellitus type 2 uncontrolled.  5. Chronic renal insufficiency  6. Thrombocytopenia 7. Sepsis, cellulitis  Recommendation: Patient will be continued on amiodarone 400 mg by  mouth 3 times a day until discharge and upon discharge he'll be started on 200 mg by mouth daily. He'll also be continued on Demadex 20 mg by mouth every morning. Patient although has had multiple episodes of nonsustained ventricular tachycardia, not felt to be candidate for ICD implantation due to cellulitis, question of noncompliance. Also he was not on any significant medical therapy prior to hospital admission with regard to congestive heart failure. The dilated cardiomyopathy appears to be chronic. On the echocardiogram there was also question whether he has apical thrombus, however this is mute as he is not a candidate for long-term anticoagulation due to compliance issues, severe thrombocytopenia. Otherwise now he is on appropriate medications, I will add lisinopril 5 mg by mouth daily today to his present medical regimen. I suspect he can be discharged home in the next 24-48 hours if outpatient therapy and physical therapy can be arranged.  Pamella Pert, M.D. 09/17/2012, 8:48 AM [facility] Pager: [phone] Office: [phone] If no answer: [phone]  I have discussed the patient's issues with Dr. Susie Cassette, patient has multiple medical issues, deconditioning, severe chronic illnesses however the recent presentation is due to multifactorial etiology.  He has marked generalized anasarca due to protein deficiency and also serum albumin is low due to acute illness.  I'll give him transfusion will not help.  We will try Primacor for congestive heart failure.  I suspect his acute renal failure is due to prerenal azotemia.  Agree with discontinuing lisinopril at this point.  Continue Demadex and I may consider Zaroxolyn.  We will recheck labs in the morning.  Dobutamine is not a ideal agent for now due to persistent tachycardia that he recently came out of and also due to presence of nonsustained ventricular tachycardia.  Overall patient is extremely ill and has very poor long-term  outcome.  Patient is not a candidate for long-term anticoagulation due to follow-up and compliance issues and also due to severe thrombocytopenia. I have reviewed the chest x-ray and also BMP from this morning along with proBNP.

## 2012-09-17 NOTE — Progress Notes (Signed)
Pt. had 2 episode run of non-sustained v-tach11-14 beat run, pt. asymptomatic SBP 90's-100's DBP-50's-60's. Dr. Jacinto Halim made aware with orders made.

## 2012-09-17 NOTE — Progress Notes (Signed)
[facility] Progress Note [place] TEAM 1 - Stepdown/ICU TEAM   NABOR THOMANN ZOX:[id number] DOB: 15-Jul-1950 DOA: 09/10/2012 PCP: Pcp Not In System  Brief narrative: 62 y/o male with DM2, CHF was admitted on 7/11 for cellulitis of his left leg. He was evaluated by [facility] and clinical findings were not c/w necrotizing fasciitis. On 7/12 he developed respiratory failure and acute encephalopathy requiring mechanical ventilation. He developed hypotension that was felt to be due to sepsis-PCT was ~ 2 and all cultures have been negative. Subsequent ECHO revealed severe LV systolic dysfunction with ejection fraction of 20-25% with global hypokinesis and markedly dilated left ventricle. Also question of apical mural thrombus. It was surmised he likely had cardiogenic shock.  He does have history of prior admit to [facility] ~ 3 years ago for "heart failure". 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 Mural thrombus -not a candidate for chronic AC (see below and Cardiology note from 09/17/2012)      Nonsustained ventricular tachycardia -started on Pacerone and per Cards note at dc can transition to 200 daily -not candidate for ICD implantation due to acute cellulitis, question of noncompliance. -in addition it does not appear as if pre admit he had received maximun (or any!) medical rxn for SHF so this would be first option re: EF may improve after medical rxn -cont Digoxin    Shock -due to cardiac etiology    Diabetes mellitus without complication -currently well controlled -HgbA1c was 6.2    Thrombocytopenia, unspecified -? Due to passive hepato congestion in setting of severe SHF/DCM -baseline unknown -due to this  problem he is NOT a candidate for chronic AC    Abnormal transaminases -TB >2.0 -as above -check abdominal US re: ?? 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 Acute cellulitis from old IV -already on anbx's -PCCM concerned over gout/theumatological cause so ANA/RF in process -on Prednisone daily- ?? taper soon   DVT prophylaxis: SCDs Code Status: Full Family Communication: Patient Disposition Plan: Remain in step down Isolation: None Nutritional Status: Acute protein calorie malnutrition related to critical illness an apparent ongoing chronic heart failure  Consultants: Cardiology  Procedures: Lower extremity venous duplex - Technically difficult due to the tighness and swelling of the left lower extremity. - No evidence of deep vein or superficial thrombosis involving the left lower extremity and right common femoral vein. - Doppler signals were pulsatile throughout consistent with CHF or fluid overload. - No evidence of Baker's cyst on the left. - Enlargement of the left inguinal lymph nodes noted.  2-D echocardiogram  - Left ventricle: The cavity size was severely dilated. Systolic function was severely reduced. The estimated ejection fraction was in the range of 20% to 25%. Cannot exclude apical thrombus. - Left atrium: The atrium was mildly dilated. - Right ventricle: The cavity size was moderately dilated. - Right atrium: The atrium was mildly dilated. - Pericardium, extracardiac: A trivial pericardial effusion was identified.  Upper extremity venous duplex/left pending    Antibiotics: Zosyn 7/12 >>> Clindamycin 7/12 >>> 7/15 Vancomycin 7/12 >>> 7/15  HPI/Subjective: Patient alert and pleasant although  seems a little confused about past medical history prior to this admission. Primarily endorses significant left hand pain. Denies shortness of breath. Noted some fatigue and weakness with activity   Objective: Blood pressure 97/55, pulse 75, temperature 98.4 F (36.9 C), temperature source Oral, resp. rate 24, height 6\' 1"  (1.854 m), weight 120 kg (264 lb 8.8 oz), SpO2 99.00%.  Intake/Output Summary (Last 24 hours) at 09/17/12 1346 Last data filed at 09/17/12 0959  Gross per 24 hour  Intake    863 ml  Output    600 ml  Net    263 ml     Exam: General: No acute respiratory distress Lungs: Clear to auscultation bilaterally without wheezes or crackles, RA Cardiovascular: Regular rate and rhythm without murmur gallop or rub normal S1 and S2, no further peripheral edema of legs but does have significant unilateral edema involving the left upper extremity extending to the hand this is associated with redness of the hand sparing the fingers and the wrist-also has edema of the right hand but this does not track up into right arm, JVD minimal Abdomen: Nontender, nondistended, soft, bowel sounds positive, no rebound, no ascites, no appreciable mass Musculoskeletal: No significant cyanosis, clubbing of bilateral lower extremities Neurological: Alert and appears oriented x 3, moves all extremities x 4 weakly without focal neurological deficits, CN 2-12 intact  Scheduled Meds: Scheduled Meds: . albuterol  2.5 mg Nebulization Q6H  . amiodarone  400 mg Oral TID  . aspirin  81 mg Oral Daily  . digoxin  0.125 mg Oral Daily  . feeding supplement  237 mL Oral TID BM  . ipratropium  0.5 mg Nebulization Q6H  . isosorbide-hydrALAZINE  1 tablet Oral TID  . lisinopril  5 mg Oral Daily  . metoprolol tartrate  12.5 mg Oral BID  . piperacillin-tazobactam  3.375 g Intravenous Q8H  . potassium chloride  20 mEq Oral BID  . predniSONE  40 mg Oral Q breakfast  . sodium chloride  3 mL Intravenous Q12H  .  torsemide  20 mg Oral BID   Continuous Infusions: . sodium chloride Stopped (09/15/12 2138)    **Reviewed in detail by the Attending Physician   Data Reviewed: Basic Metabolic Panel:  Recent Labs Lab 09/11/12 0545  09/12/12 1356 09/13/12 0435 09/14/12 0400 09/15/12 0507 09/15/12 1800 09/15/12 1930 09/16/12 0310 09/17/12 0445  NA  --   < > 138 145 149* 151*  --  144 142  --   K  --   < > 3.8 3.3* 3.0* 2.9*  --  3.4* 3.7  --   CL  --   < > 98 102 105 107  --  104 103  --   CO2  --   < > 27 34* 34* 35*  --  36* 36*  --   GLUCOSE  --   < > 112* 113* 134* 79  --  85 105*  --   BUN  --   < > 49* 45* 39* 37*  --  32* 31*  --   CREATININE  --   < > 1.84* 1.61* 1.40* 1.17  --  1.00 0.98  --   CALCIUM  --   < > 8.2* 8.1* 8.2* 8.2*  --  7.9* 8.0*  --   MG 2.0  --   --  1.5  --  1.4* 2.3  --  2.2  --   PHOS  --   --   --  4.5  --  3.3  --   --  2.9 3.7  < > = values in this interval not displayed. Liver Function Tests:  Recent Labs Lab 09/12/12 1356 09/13/12 0435 09/17/12 0445  AST 27 21 23   ALT 26 23 24   ALKPHOS 72 72 127*  BILITOT 2.4* 2.9* 2.1*  PROT 5.5* 5.4* 5.5*  ALBUMIN 1.6* 1.4* 1.3*   No results found for this basename: LIPASE, AMYLASE,  in the last 168 hours No results found for this basename: AMMONIA,  in the last 168 hours CBC:  Recent Labs Lab 09/10/12 1710  09/12/12 0500 09/13/12 0435 09/14/12 0400 09/15/12 0507 09/15/12 1059  WBC 7.2  < > 7.5 7.4 7.3 9.9 12.2*  NEUTROABS 5.9  --   --   --   --   --  10.6*  HGB 16.5  < > 14.7 15.4 15.7 13.8 14.8  HCT 48.7  < > 45.2 44.8 46.8 41.4 43.0  MCV 78.2  < > 78.3 76.1* 78.3 78.9 77.6*  PLT 52*  < > 39* 62* 47* 45* 48*  < > = values in this interval not displayed. Cardiac Enzymes:  Recent Labs Lab 09/13/12 1500 09/15/12 0507 09/16/12 0310 09/16/12 1002 09/16/12 1805  CKTOTAL  --  37  --   --   --   TROPONINI <0.30  --  <0.30 <0.30 <0.30   BNP (last 3 results)  Recent Labs  09/10/12 1710   PROBNP 23656.0*   CBG:  Recent Labs Lab 09/15/12 0845 09/15/12 1215 09/15/12 1624 09/15/12 2031 09/16/12 1657  GLUCAP 71 73 85 73 175*    Recent Results (from the past 240 hour(s))  CLOSTRIDIUM DIFFICILE BY PCR     Status: None   Collection Time    09/11/12 11:13 AM      Result Value Range Status   C difficile by pcr NEGATIVE  NEGATIVE Final  CULTURE, BLOOD (ROUTINE X 2)     Status: None   Collection Time    09/11/12  8:20 PM      Result Value Range Status   Specimen Description BLOOD RIGHT ARM   Final   Special Requests BOTTLES DRAWN AEROBIC AND ANAEROBIC 10CC EA   Final   Culture  Setup Time 09/12/2012 03:24   Final   Culture     Final   Value:        BLOOD CULTURE RECEIVED NO GROWTH TO DATE CULTURE WILL BE HELD FOR 5 DAYS BEFORE ISSUING A FINAL NEGATIVE REPORT   Report Status PENDING   Incomplete  CULTURE, BLOOD (ROUTINE X 2)     Status: None   Collection Time    09/11/12  8:30 PM      Result Value Range Status   Specimen Description BLOOD LEFT HAND   Final   Special Requests BOTTLES DRAWN AEROBIC ONLY 10CC   Final   Culture  Setup Time 09/12/2012 03:24   Final   Culture     Final   Value:        BLOOD CULTURE RECEIVED NO GROWTH TO DATE CULTURE WILL BE HELD FOR 5 DAYS BEFORE ISSUING A FINAL NEGATIVE REPORT   Report Status PENDING   Incomplete  URINE CULTURE     Status: None   Collection Time    09/12/12  6:15 AM  Result Value Range Status   Specimen Description URINE, CATHETERIZED   Final   Special Requests NONE   Final   Culture  Setup Time 09/12/2012 06:44   Final   Colony Count NO GROWTH   Final   Culture NO GROWTH   Final   Report Status 09/13/2012 FINAL   Final     Studies:  Recent x-ray studies have been reviewed in detail by the Attending Physician   patient seen and examined with nurse practitioner And agree with the assessment and plan Darrick Grinder, ANP [facility] Office  [phone] Pager [phone]  **If  unable to reach the above provider after paging please contact the Flow Manager @ [phone]  On-Call/Text Page:      [email or web address]      password [email or web address]  If 7PM-7AM, please contact night-coverage [email or web address] Password [email or web address] 09/17/2012, 1:46 PM   LOS: 7 days

## 2012-09-18 LAB — BASIC METABOLIC PANEL
BUN: 47 mg/dL — ABNORMAL HIGH (ref 6–23)
Chloride: 98 mEq/L (ref 96–112)
GFR calc Af Amer: 46 mL/min — ABNORMAL LOW (ref >90)
Glucose, Bld: 122 mg/dL — ABNORMAL HIGH (ref 70–99)
Potassium: 4.3 mEq/L (ref 3.5–5.1)

## 2012-09-18 LAB — CULTURE, BLOOD (ROUTINE X 2): Culture: NO GROWTH

## 2012-09-18 LAB — CBC
HCT: 36.9 % — ABNORMAL LOW (ref 39.0–52.0)
Hemoglobin: 12.6 g/dL — ABNORMAL LOW (ref 13.0–17.0)
RDW: 21.3 % — ABNORMAL HIGH (ref 11.5–15.5)
WBC: 9.4 10*3/uL (ref 4.0–10.5)

## 2012-09-18 LAB — PHOSPHORUS: Phosphorus: 4.1 mg/dL (ref 2.3–4.6)

## 2012-09-18 MED ORDER — PREDNISONE 20 MG PO TABS
20.0000 mg | ORAL_TABLET | Freq: Every day | ORAL | Status: DC
  Administered 2012-09-19 – 2012-09-22 (×4): 20 mg via ORAL
  Filled 2012-09-18 (×5): qty 1

## 2012-09-18 MED ORDER — ALBUTEROL SULFATE (5 MG/ML) 0.5% IN NEBU
2.5000 mg | INHALATION_SOLUTION | Freq: Four times a day (QID) | RESPIRATORY_TRACT | Status: DC | PRN
Start: 2012-09-18 — End: 2012-09-27

## 2012-09-18 MED ORDER — FUROSEMIDE 10 MG/ML IJ SOLN
40.0000 mg | Freq: Once | INTRAMUSCULAR | Status: AC
  Administered 2012-09-18: 40 mg via INTRAVENOUS

## 2012-09-18 MED ORDER — IPRATROPIUM BROMIDE 0.02 % IN SOLN
0.5000 mg | Freq: Three times a day (TID) | RESPIRATORY_TRACT | Status: DC
  Administered 2012-09-19 – 2012-09-24 (×17): 0.5 mg via RESPIRATORY_TRACT
  Filled 2012-09-18 (×18): qty 2.5

## 2012-09-18 MED ORDER — SODIUM CHLORIDE 0.9 % IJ SOLN
10.0000 mL | Freq: Two times a day (BID) | INTRAMUSCULAR | Status: DC
  Administered 2012-09-18 – 2012-09-22 (×9): 10 mL via INTRAVENOUS

## 2012-09-18 MED ORDER — ALBUTEROL SULFATE (5 MG/ML) 0.5% IN NEBU
2.5000 mg | INHALATION_SOLUTION | Freq: Three times a day (TID) | RESPIRATORY_TRACT | Status: DC
  Administered 2012-09-19 – 2012-09-24 (×17): 2.5 mg via RESPIRATORY_TRACT
  Filled 2012-09-18 (×18): qty 0.5

## 2012-09-18 NOTE — Progress Notes (Addendum)
NUTRITION FOLLOW UP  Intervention:   Glucerna Shake po TID, each supplement provides 220 kcal and 10 grams of protein.  Nutrition Dx:   Inadequate oral intake now related to decreased appetite as evidenced by meal completion of 50%; progressing.   New Goal: Pt to meet >/= 90% of their estimated nutrition needs, progressing.  Monitor:   PO intake, supplement acceptance, weight trend  Assessment:   Pt with DM2 and CHF was admitted on 7/11 by [facility] to [facility] for cellulitis of his left leg. On 7/12 he developed respiratory failure and acute encephalopathy requiring mechanical ventilation. Pt also with stage II on buttocks. .  Pt extubated 7/15. Pt eating 75% of his meals on 7/18. Glucerna Shakes ordered.   Height: Ht Readings from Last 1 Encounters:  09/15/12 6\' 1"  (1.854 m)    Weight Status:   Wt Readings from Last 1 Encounters:  09/18/12 276 lb 3.8 oz (125.3 kg)  Admission weight 271 lb 7/11 - pt being diuresed  Re-estimated needs:  Kcal: 2200-2400 Protein: 105-115 grams Fluid: > 2 L/day  Skin: stage II buttocks, cellulitis   Diet Order: Cardiac Meal Completion: 75%    Intake/Output Summary (Last 24 hours) at 09/18/12 1204 Last data filed at 09/18/12 0600  Gross per 24 hour  Intake  682.5 ml  Output    600 ml  Net   82.5 ml    Last BM: 7/19   Labs:   Recent Labs Lab 09/15/12 0507 09/15/12 1800 09/15/12 1930 09/16/12 0310 09/17/12 0445 09/18/12 0520 09/18/12 0856  NA 151*  --  144 142  --   --  137  K 2.9*  --  3.4* 3.7  --   --  4.3  CL 107  --  104 103  --   --  98  CO2 35*  --  36* 36*  --   --  31  BUN 37*  --  32* 31*  --   --  47*  CREATININE 1.17  --  1.00 0.98  --   --  1.77*  CALCIUM 8.2*  --  7.9* 8.0*  --   --  8.8  MG 1.4* 2.3  --  2.2  --   --   --   PHOS 3.3  --   --  2.9 3.7 4.1  --   GLUCOSE 79  --  85 105*  --   --  122*    CBG (last 3)   Recent Labs  09/15/12 1624 09/15/12 2031 09/16/12 1657  GLUCAP 85 73 175*     Scheduled Meds: . albuterol  2.5 mg Nebulization Q6H  . amiodarone  400 mg Oral TID  . aspirin  81 mg Oral Daily  . digoxin  0.125 mg Oral Daily  . feeding supplement  237 mL Oral TID BM  . ipratropium  0.5 mg Nebulization Q6H  . isosorbide-hydrALAZINE  1 tablet Oral TID  . lisinopril  5 mg Oral Daily  . metoprolol tartrate  12.5 mg Oral BID  . piperacillin-tazobactam  3.375 g Intravenous Q8H  . potassium chloride  20 mEq Oral BID  . [START ON 09/19/2012] predniSONE  20 mg Oral Q breakfast  . sodium chloride  10 mL Intravenous Q12H  . torsemide  20 mg Oral BID    Continuous Infusions: . sodium chloride Stopped (09/15/12 2138)    Kendell Bane RD, LDN, CNSC [phone] Pager [phone] After Hours Pager
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
## 2012-09-19 ENCOUNTER — Inpatient Hospital Stay (HOSPITAL_COMMUNITY): Payer: Medicaid Other

## 2012-09-19 LAB — PHOSPHORUS: Phosphorus: 4.8 mg/dL — ABNORMAL HIGH (ref 2.3–4.6)

## 2012-09-19 LAB — BASIC METABOLIC PANEL
Calcium: 8.7 mg/dL (ref 8.4–10.5)
GFR calc non Af Amer: 36 mL/min — ABNORMAL LOW (ref >90)
Glucose, Bld: 129 mg/dL — ABNORMAL HIGH (ref 70–99)
Sodium: 135 mEq/L (ref 135–145)

## 2012-09-19 LAB — PRO B NATRIURETIC PEPTIDE: Pro B Natriuretic peptide (BNP): 32000 pg/mL — ABNORMAL HIGH (ref 0–125)

## 2012-09-19 MED ORDER — MILRINONE IN DEXTROSE 20 MG/100ML IV SOLN
0.2500 ug/kg/min | INTRAVENOUS | Status: DC
  Administered 2012-09-19 – 2012-09-22 (×6): 0.25 ug/kg/min via INTRAVENOUS
  Filled 2012-09-19 (×6): qty 100

## 2012-09-19 MED ORDER — ACETAMINOPHEN 325 MG PO TABS
650.0000 mg | ORAL_TABLET | Freq: Four times a day (QID) | ORAL | Status: DC | PRN
  Administered 2012-09-20 – 2012-09-27 (×2): 650 mg via ORAL
  Filled 2012-09-19 (×2): qty 2

## 2012-09-19 NOTE — Progress Notes (Signed)
Clinical Social Work Department CLINICAL SOCIAL WORK PLACEMENT NOTE 09/19/2012  Patient:  Caleb Bauer, Caleb Bauer  Account Number:  [id number] Admit date:  09/10/2012  Clinical Social Worker:  Pollyann Savoy, LCSW  Date/time:  09/19/2012 04:30 PM  Clinical Social Work is seeking post-discharge placement for this patient at the following level of care:   SKILLED NURSING   (*CSW will update this form in Epic as items are completed)   09/19/2012  Patient/family provided with [facility] Department of Clinical Social Work's list of facilities offering this level of care within the geographic area requested by the patient (or if unable, by the patient's family).  09/19/2012  Patient/family informed of their freedom to choose among providers that offer the needed level of care, that participate in Medicare, Medicaid or managed care program needed by the patient, have an available bed and are willing to accept the patient.  09/19/2012  Patient/family informed of [facility]' ownership interest in [facility], as well as of the fact that they are under no obligation to receive care at this facility.  PASARR submitted to EDS on 09/19/2012 PASARR number received from [employer] on 09/19/2012  FL2 transmitted to all facilities in geographic area requested by pt/family on  09/19/2012 FL2 transmitted to all facilities within larger geographic area on   Patient informed that his/her managed care company has contracts with or will negotiate with  certain facilities, including the following:     Patient/family informed of bed offers received:   Patient chooses bed at  Physician recommends and patient chooses bed at    Patient to be transferred to  on   Patient to be transferred to facility by   The following physician request were entered in Epic:

## 2012-09-19 NOTE — Progress Notes (Signed)
Pharmacy Consult for Milrinone (Primacor) Initiation  Indication:   Acute Decompensated Heart Failure with volume overload and low cardiac output  Allergies  Allergen Reactions  . Other Nausea And Vomiting    Patient stated drug is "kerein"    Temp:  [97.3 F (36.3 C)-97.9 F (36.6 C)] 97.7 F (36.5 C) (07/20 1200) Pulse Rate:  [63-68] 67 (07/20 1200) Cardiac Rhythm:  [-] Normal sinus rhythm;Bundle branch block (07/20 0800) Resp:  [17-30] 22 (07/20 1200) BP: (102-126)/(58-84) 102/70 mmHg (07/20 1200) SpO2:  [91 %-97 %] 94 % (07/20 1350)  LABS    Component Value Date/Time   NA 135 09/19/2012 1200   K 4.6 09/19/2012 1200   CL 96 09/19/2012 1200   CO2 30 09/19/2012 1200   GLUCOSE 129* 09/19/2012 1200   BUN 52* 09/19/2012 1200   CREATININE 1.92* 09/19/2012 1200   CALCIUM 8.7 09/19/2012 1200   GFRNONAA 36* 09/19/2012 1200   GFRAA 42* 09/19/2012 1200   Last magnesium:  Lab Results  Component Value Date   MG 2.2 09/16/2012   Estimated Creatinine Clearance: 56.1 ml/min (by C-G formula based on Cr of 1.92). CREATININE: 1.92 mg/dL ABNORMAL (16/10/96 0454) Estimated creatinine clearance - Cockcroft-Gault CrCl: 56.1 mL/min estimated creatinine clearance is 56.1 ml/min (by C-G formula based on Cr of 1.92).   Intake/Output Summary (Last 24 hours) at 09/19/12 1530 Last data filed at 09/19/12 0900  Gross per 24 hour  Intake   1249 ml  Output    950 ml  Net    299 ml    Filed Weights   09/15/12 0459 09/15/12 1621 09/18/12 0500  Weight: 260 lb 5.8 oz (118.1 kg) 264 lb 8.8 oz (120 kg) 276 lb 3.8 oz (125.3 kg)    Assessment:  Caleb Bauer admitted 09/10/2012 with acute decompensated congestive heart failure to be initiated on milrinone.  Patient with EF 20%.   Potassium, Magnesium, SCr, and vital signs are stable. Call physician for replacement if potassium is < 4 or magnesium is < 2 and replacement has not already been ordered.  Milrinone can cause arrhythmias.  Monitor patient for ECG  changes.  Plan is to initiate milrinone for inotropic support.  Plan:  1. Initiate milrinone based on renal function: (Consider starting dose of 0.125 - 0.25 for patients with SBP <167mmHg) [employer] [facility] Calculated CrCl Dose  []  > 50 ml/min 0.375 mcg/kg/min  [x]  20-49 ml/min 0.250 mcg/kg/min  []  < 20 ml/min 0.125 mcg/kg/min   2. Nursing to monitor vital signs per milrinone protocol and physician parameters. 3. Pharmacy to follow peripherally, please reconsult if needed or there is further questions. 4.  Please contact MD for further dosing instructions.  Thank you for allowing [place] to be a part of this patient's care.  Marylouise Stacks  3:30 PM 09/19/2012
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
## 2012-09-19 NOTE — Progress Notes (Signed)
Clinical Social Work Department BRIEF PSYCHOSOCIAL ASSESSMENT 09/19/2012  Patient:  Caleb Bauer, Caleb Bauer     Account Number:  [id number]     Admit date:  09/10/2012  Clinical Social Worker:  Illene Silver  Date/Time:  09/19/2012 04:30 PM  Referred by:  Care Management  Date Referred:  09/17/2012 Referred for  SNF Placement   Other Referral:   Interview type:  Patient Other interview type:    PSYCHOSOCIAL DATA Living Status:  FAMILY Admitted from facility:   Level of care:   Primary support name:  niece Primary support relationship to patient:  FAMILY Degree of support available:   Pt does not plan on returning to live with niece after rehab.  Poor.    CURRENT CONCERNS Current Concerns  Post-Acute Placement   Other Concerns:    SOCIAL WORK ASSESSMENT / PLAN CSW met with pt re: role of CSW/dcp.  Pt agreeable to NHP in the [place] area at d/c.  CSW to begin bed search.   Assessment/plan status:  Psychosocial Support/Ongoing Assessment of Needs Other assessment/ plan:   Information/referral to community resources:    PATIENT'S/FAMILY'S RESPONSE TO PLAN OF CARE: Anxious to begin rehab and get out of hospital.

## 2012-09-20 LAB — BASIC METABOLIC PANEL
BUN: 56 mg/dL — ABNORMAL HIGH (ref 6–23)
Chloride: 92 mEq/L — ABNORMAL LOW (ref 96–112)
Creatinine, Ser: 2.12 mg/dL — ABNORMAL HIGH (ref 0.50–1.35)
GFR calc Af Amer: 37 mL/min — ABNORMAL LOW (ref >90)
Glucose, Bld: 97 mg/dL (ref 70–99)

## 2012-09-20 MED ORDER — DOPAMINE-DEXTROSE 3.2-5 MG/ML-% IV SOLN
5.0000 ug/kg/min | INTRAVENOUS | Status: DC
  Administered 2012-09-20 – 2012-09-22 (×3): 5 ug/kg/min via INTRAVENOUS
  Filled 2012-09-20 (×3): qty 250

## 2012-09-20 MED ORDER — AMIODARONE HCL 200 MG PO TABS
200.0000 mg | ORAL_TABLET | Freq: Every day | ORAL | Status: DC
  Administered 2012-09-20 – 2012-09-27 (×8): 200 mg via ORAL
  Filled 2012-09-20 (×8): qty 1

## 2012-09-20 NOTE — Progress Notes (Signed)
Subjective: Pt now being treated for CHF exacerbation.  States his swelling is improving.  He is sitting up in a chair.  NAD.    Objective: Vital signs in last 24 hours: Temp:  [97.6 F (36.4 C)-98.3 F (36.8 C)] 98 F (36.7 C) (07/21 0845) Pulse Rate:  [65-71] 70 (07/21 0901) Resp:  [16-26] 20 (07/21 0845) BP: (92-111)/(44-70) 105/58 mmHg (07/21 0845) SpO2:  [86 %-95 %] 86 % (07/21 0959) Last BM Date: 09/19/12  Intake/Output from previous day: 07/20 0701 - 07/21 0700 In: 673.3 [P.O.:474; I.V.:99.3; IV Piggyback:100] Out: 1050 [Urine:1050] Intake/Output this shift: Total I/O In: -  Out: 301 [Urine:300; Stool:1]  Incision/Wound: LLE, anterior aspect.  Edges are approximated.  There is a small amount of serosanguinous output.  I removed 2 stitches which he tolerated well and applied a mepilex border dressing.  Lab Results:   Recent Labs  09/18/12 0520  WBC 9.4  HGB 12.6*  HCT 36.9*  PLT 80*   BMET  Recent Labs  09/19/12 1200 09/20/12 0515  NA 135 131*  K 4.6 4.3  CL 96 92*  CO2 30 31  GLUCOSE 129* 97  BUN 52* 56*  CREATININE 1.92* 2.12*  CALCIUM 8.7 8.5     Studies/Results: Dg Chest 2 View  09/19/2012   *RADIOLOGY REPORT*  Clinical Data: Shortness of breath.  Fluid retention.  CHEST - 2 VIEW  Comparison: Plain film chest 09/14/2012 and 09/16/2012.  Findings: Right IJ catheter remains in place.  Marked enlargement of the cardiopericardial silhouette is again seen.  There is dense left basilar airspace opacity.  Pulmonary edema appears unchanged.  IMPRESSION:  1.  Dense left basilar airspace opacity could be due to atelectasis or pneumonia.  There is a left pleural effusion. 2.  No marked change in pulmonary edema. 3.  Marked enlargement of the cardiopericardial silhouette compatible with cardiomegaly and/or pericardial effusion.   Original Report Authenticated By: Holley Dexter, M.D.    Anti-infectives: Anti-infectives   Start     Dose/Rate Route  Frequency Ordered Stop   09/12/12 1000  vancomycin (VANCOCIN) 1,250 mg in sodium chloride 0.9 % 250 mL IVPB  Status:  Discontinued     1,250 mg 166.7 mL/hr over 90 Minutes Intravenous Every 12 hours 09/11/12 1951 09/14/12 1358   09/12/12 0600  piperacillin-tazobactam (ZOSYN) IVPB 3.375 g     3.375 g 12.5 mL/hr over 240 Minutes Intravenous 3 times per day 09/12/12 0504 09/21/12 2359   09/11/12 2000  vancomycin (VANCOCIN) 2,500 mg in sodium chloride 0.9 % 500 mL IVPB     2,500 mg 250 mL/hr over 120 Minutes Intravenous STAT 09/11/12 1951 09/11/12 2357   09/11/12 0600  clindamycin (CLEOCIN) IVPB 600 mg  Status:  Discontinued     600 mg 100 mL/hr over 30 Minutes Intravenous 3 times per day 09/10/12 2132 09/10/12 2207   09/11/12 0600  clindamycin (CLEOCIN) IVPB 900 mg  Status:  Discontinued     900 mg 100 mL/hr over 30 Minutes Intravenous 3 times per day 09/10/12 2207 09/14/12 1358   09/10/12 2000  clindamycin (CLEOCIN) IVPB 900 mg     900 mg 100 mL/hr over 30 Minutes Intravenous  Once 09/10/12 1947 09/10/12 2033      Assessment/Plan: LLE cellulitis -no evidence on necrotizing fascitis.  I removed his stitches.  The incision is healing well.  Recommend applying Mepilex border the area daily. Surgery will sign off at this time.  Please do not hesitate to contact [facility] should new  or concerning symptoms develop.    LOS: 10 days    Bonner Puna [facility] ANP-BC Pager [phone]  09/20/2012 11:32 AM

## 2012-09-20 NOTE — Evaluation (Signed)
Occupational Therapy Evaluation Patient Details Name: Caleb Bauer MRN: [id number] DOB: Mar 10, 1950 Today's Date: 09/20/2012 Time: 4098-1191 OT Time Calculation (min): 41 min  OT Assessment / Plan / Recommendation History of present illness 62 y/o male with DM2, CHF was admitted on 7/11 for cellulitis of his left leg.  On 7/12 he developed respiratory failure and acute encephalopathy requiring mechanical ventilation. He developed hypotension that was felt to be due to sepsis-PCT was ~ 2 and all cultures have been negative. Subsequent ECHO revealed severe LV systolic dysfunction with ejection fraction of 20-25% with global hypokinesis and markedly dilated left ventricle. Also question of apical mural thrombus. It was surmised he likely had cardiogenic shock. Pt continues to have significant edema.   Clinical Impression   Pt currently with limited selfcare function variable from a min assist to total assist level secondary to bilateral UE weakness and limited ROM.  Will benefit from acute care OT to help increase overall independence with selfcare tasks with use of AE, DME, and techniques.  Overall feel pt will need 24 hour supervision at discharge and since this is not available, feel he will need SNF for further rehab.  Pt demonstrates decreased O2 sats at rest and with activity during session.  Pt placed on 7Ls nasal cannula and sats at 86% with self feeding and 90-92 % with rest and purse lip breathing.  Decreased to 78% on 7Ls with transfer to bedside chair as well.    OT Assessment  Patient needs continued OT Services    Follow Up Recommendations  CIR    Barriers to Discharge Decreased caregiver support    Equipment Recommendations  3 in 1 bedside comode       Frequency  Min 2X/week    Precautions / Restrictions Precautions Precautions: Fall Precaution Comments: bilateral edema in his hands limiting functional use Restrictions Weight Bearing Restrictions: No   Pertinent  Vitals/Pain O2 sats decreasing to 78% with activity on 7Ls, 90%     ADL  Eating/Feeding: Performed;Minimal assistance Where Assessed - Eating/Feeding: Chair Grooming: Simulated;Minimal assistance Where Assessed - Grooming: Unsupported sitting Upper Body Bathing: Simulated;Minimal assistance Where Assessed - Upper Body Bathing: Unsupported sitting Lower Body Bathing: Simulated;Moderate assistance Where Assessed - Lower Body Bathing: Unsupported sit to stand Upper Body Dressing: Simulated;Minimal assistance Where Assessed - Upper Body Dressing: Unsupported sitting Lower Body Dressing: Performed;Maximal assistance Where Assessed - Lower Body Dressing: Unsupported sit to stand Toilet Transfer: Simulated;Moderate assistance Toilet Transfer Method: Stand pivot Toilet Transfer Equipment: Other (comment) (bedside chair) Toileting - Clothing Manipulation and Hygiene: Simulated;+1 Total assistance Where Assessed - Toileting Clothing Manipulation and Hygiene: Sit to stand from 3-in-1 or toilet Tub/Shower Transfer Method: Not assessed Equipment Used: Rolling walker Transfers/Ambulation Related to ADLs: Pt currently mod assist for functional transfers stand pivot using the RW. ADL Comments: Pt with limited ability to perform all selfcare tasks secondary to hand limitations for grasp and holding items, with the LUE being more impaired.  Issued pt red foam grips to place over utensils in order for pt to be able to hold onto his fork and spoon better.  Pt also with decreased ability to reach his LEs o cross them at this time secondary to edema.  May benefit from AE trial as well.      OT Diagnosis: Generalized weakness  OT Problem List: Decreased strength;Decreased activity tolerance;Impaired balance (sitting and/or standing);Decreased range of motion;Decreased knowledge of use of DME or AE;Cardiopulmonary status limiting activity;Impaired UE functional use;Obesity;Increased edema OT Treatment  Interventions: Self-care/ADL training;Therapeutic exercise;Neuromuscular education;Energy conservation;Therapeutic activities;DME and/or AE instruction;Balance training;Patient/family education   OT Goals(Current goals can be found in the care plan section) Acute Rehab OT Goals Patient Stated Goal: Did not state this session. OT Goal Formulation: With patient Time For Goal Achievement: 10/04/12 Potential to Achieve Goals: Good ADL Goals Pt Will Perform Eating: with modified independence;with adaptive utensils;sitting Pt Will Perform Grooming: standing;with min guard assist Pt Will Perform Upper Body Bathing: with supervision;sitting;bed level Pt Will Perform Lower Body Bathing: with adaptive equipment;with min assist;sit to/from stand Pt Will Perform Lower Body Dressing: with adaptive equipment;with min assist;sit to/from stand Pt Will Transfer to Toilet: with min assist;ambulating;bedside commode Pt Will Perform Toileting - Clothing Manipulation and hygiene: with min assist;sit to/from stand;with adaptive equipment Additional ADL Goal #1: Pt will demonstrate full digit flexion AROM in bilateral hands for greater functional use with selfcare tasks.  Visit Information  Last OT Received On: 09/20/12 Assistance Needed: +1 History of Present Illness: 62 y/o male with DM2, CHF was admitted on 7/11 for cellulitis of his left leg.  On 7/12 he developed respiratory failure and acute encephalopathy requiring mechanical ventilation. He developed hypotension that was felt to be due to sepsis-PCT was ~ 2 and all cultures have been negative. Subsequent ECHO revealed severe LV systolic dysfunction with ejection fraction of 20-25% with global hypokinesis and markedly dilated left ventricle. Also question of apical mural thrombus. It was surmised he likely had cardiogenic shock. Pt continues to have significant edema.       Prior Functioning     Home Living Family/patient expects to be discharged to::  Skilled nursing facility Living Arrangements: Other relatives Available Help at Discharge: Family;Available PRN/intermittently Home Access: Stairs to enter Entrance Stairs-Number of Steps: 3-4 Home Layout: One level Home Equipment: Cane - single point;Walker - 2 wheels Prior Function Level of Independence: Independent with assistive device(s) Communication Communication: No difficulties Dominant Hand: Right         Vision/Perception Vision - History Baseline Vision: No visual deficits Patient Visual Report: No change from baseline Vision - Assessment Vision Assessment: Vision not tested Perception Perception: Within Functional Limits Praxis Praxis: Intact   Cognition  Cognition Arousal/Alertness: Awake/alert Behavior During Therapy: WFL for tasks assessed/performed Overall Cognitive Status: Within Functional Limits for tasks assessed    Extremity/Trunk Assessment Upper Extremity Assessment Upper Extremity Assessment: RUE deficits/detail;LUE deficits/detail RUE Deficits / Details: Pt with AROM WFLS for shoulder flexion and elbow flexion and extension.   Shoulder and elbow strength currentyl at 3+/5.  Pt with limited digit flexion throughout secondary to increased edema.  Pt only able to exhibit approximately 25 % of full grasp.   RUE Coordination: decreased fine motor;decreased gross motor LUE Deficits / Details: Pt with AROM WFLs for shoulder and elbow with strength at a 3+/5 level.  Gross digit AROM for flexion 0-50% secondary to increased edema in the hands.   LUE Coordination: decreased fine motor;decreased gross motor Lower Extremity Assessment Lower Extremity Assessment: Defer to PT evaluation Cervical / Trunk Assessment Cervical / Trunk Assessment: Normal     Mobility Bed Mobility Bed Mobility: Supine to Sit Supine to Sit: 3: Mod assist;With rails;HOB elevated Sitting - Scoot to Edge of Bed: 4: Min assist Transfers Transfers: Sit to Stand;Stand to Sit Sit to  Stand: 3: Mod assist;From bed;With upper extremity assist Sit to Stand: Patient Percentage: 60% Stand to Sit: 3: Mod assist;With upper extremity assist;To bed Details for Transfer Assistance: Performed stand pivot chair to [facility] to chair  with rolling walker. Assist to bring hips up.        Balance Balance Balance Assessed: Yes Static Standing Balance Static Standing - Balance Support: Right upper extremity supported;Left upper extremity supported Static Standing - Level of Assistance: 4: Min assist   End of Session OT - End of Session Equipment Utilized During Treatment: Gait belt Activity Tolerance: Patient tolerated treatment well Patient left: in chair;with call bell/phone within reach Nurse Communication: Mobility status;Other (comment) (Pt's O2 sats)     Koen Antilla OTR/L Pager number [phone] 09/20/2012, 1:04 PM

## 2012-09-20 NOTE — Progress Notes (Signed)
[facility] Progress Note [place] TEAM 1 - Stepdown/ICU TEAM   Caleb Bauer XBJ:[id number] DOB: 1951/02/03 DOA: 09/10/2012 PCP: Pcp Not In System  Brief narrative: 62 y/o male with DM2, CHF admitted on 7/11 for cellulitis of his left leg. He was evaluated by [facility] and clinical findings were not c/w necrotizing fasciitis. On 7/12 he developed respiratory failure and acute encephalopathy requiring mechanical ventilation. He developed hypotension that was felt to be due to sepsis - PCT was ~ 2.  All cultures have been negative. Subsequent ECHO revealed severe LV systolic dysfunction with ejection fraction of 20-25% with global hypokinesis and markedly dilated left ventricle. Also question of apical mural thrombus. It was surmised he likely had cardiogenic shock.  He does have history of prior admit to [facility] ~ 3 years ago for "heart failure". 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 -Dopamine started 7/21 per Cards to support BP and hopefully then promote better diuresis -ACE I added 7/18 but dc'd due to progressive renal dysfunction  ? Mural thrombus -not a candidate for chronic AC (see below and Cardiology note from 09/17/2012)    Nonsustained ventricular tachycardia -started on Pacerone and per Cards note at dc can transition to 200 daily -not candidate for ICD implantation due to acute cellulitis, question of noncompliance -in addition it does not appear as if pre admit he had received maximun medical rxn for SHF so this would be first option re: EF may improve after medical rxn -cont Digoxin -rate controlled w/o recurrence - watch with initiation of Dopamine -Keep K >/= 4.0  Shock -due to cardiac etiology -BP soft/  see above re: Dopamine  Acute renal insufficiency -likely has underlying CKD ([place] c/w medical renal disease)  but baseline Scr unknown -suspect etiology due to low EF in setting of likely diabetic nephropathy -may progress  Diabetes mellitus without complication -currently well controlled -HgbA1c was 6.2  Thrombocytopenia, unspecified -?due to passive hepatic congestion in setting of severe SHF/DCM -baseline unknown -due to this problem he is NOT a candidate for chronic AC -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 but this is non diagnostic  Abnormal transaminases -TB >2.0 but has trended down to 1.4 -as above -Abdominal US - liver unremarkable -likely due to passive hepatic congestion due to severe DCM  Hypertension -controlled and suspect now has degree of chronic hypotension due to low EF/DCM  Cellulitis of LLE -resolving -cont Zosyn  Edema of left upper extremity -same site 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 - No evidence of deep vein or superficial thrombosis involving the left lower extremity and right common femoral vein. - Doppler signals were pulsatile throughout consistent with CHF or fluid overload. - No evidence of Baker's cyst on the left. - Enlargement of the left inguinal lymph nodes noted.  2-D echocardiogram  - Left ventricle: The cavity size was severely dilated.  Systolic function was severely reduced. The estimated ejection fraction was in the range of 20% to 25%. Cannot exclude apical  thrombus. - Left atrium: The atrium was mildly dilated. - Right ventricle: The cavity size was moderately dilated. - Right atrium: The atrium was mildly dilated. - Pericardium, extracardiac: A trivial pericardial effusion was identified.  Upper extremity venous duplex/left  No obvious evidence of deep vein or superficial thrombosis involving the left upper extremity   Antibiotics: Zosyn 7/12 >>> Clindamycin 7/12 >>> 7/15 Vancomycin 7/12 >>> 7/15  HPI/Subjective: Patient alert and pleasant. Endorsed improvement in prior malaise but not yet back to baseline. Says pain in left hand much better.  Objective: Blood pressure 105/58, pulse 70, temperature 98 F (36.7 C), temperature source Oral, resp. rate 20, height 6\' 1"  (1.854 m), weight 125.3 kg (276 lb 3.8 oz), SpO2 86.00%.  Intake/Output Summary (Last 24 hours) at 09/20/12 1019 Last data filed at 09/20/12 0846  Gross per 24 hour  Intake  319.3 ml  Output   1351 ml  Net -1031.7 ml   Exam: General: No acute respiratory distress Lungs: Clear to auscultation bilaterally without wheezes or crackles, RA Cardiovascular: Regular rate and rhythm without murmur gallop or rub normal S1 and S2, no further peripheral edema of legs - previous significant unilateral erythema involving the left upper extremity extending to the hand has improved with near resolution of erythema - has edema of the right and left hands  Abdomen: Nontender, nondistended, soft, bowel sounds positive, no rebound, no ascites, no appreciable mass Musculoskeletal: No significant cyanosis, clubbing of bilateral lower extremities Neurological: Alert and appears oriented x 3  Scheduled Meds: Scheduled Meds: . albuterol  2.5 mg Nebulization TID  . amiodarone  200 mg Oral Daily  . aspirin  81 mg Oral Daily  . digoxin  0.125 mg Oral Daily  . feeding supplement  237 mL Oral TID BM  . ipratropium  0.5 mg Nebulization TID  . isosorbide-hydrALAZINE  1 tablet Oral TID  .  metoprolol tartrate  12.5 mg Oral BID  . piperacillin-tazobactam  3.375 g Intravenous Q8H  . potassium chloride  20 mEq Oral BID  . predniSONE  20 mg Oral Q breakfast  . sodium chloride  10 mL Intravenous Q12H  . torsemide  20 mg Oral BID   Continuous Infusions: . sodium chloride 20 mL/hr at 09/18/12 0700  . DOPamine 5 mcg/kg/min (09/20/12 0901)  . milrinone 0.25 mcg/kg/min (09/20/12 0316)   Data Reviewed: Basic Metabolic Panel:  Recent Labs Lab 09/15/12 0507 09/15/12 1800 09/15/12 1930 09/16/12 0310 09/17/12 0445 09/18/12 0520 09/18/12 0856 09/19/12 0450 09/19/12 1200 09/20/12 0515  NA 151*  --  144 142  --   --  137  --  135 131*  K 2.9*  --  3.4* 3.7  --   --  4.3  --  4.6 4.3  CL 107  --  104 103  --   --  98  --  96 92*  CO2 35*  --  36* 36*  --   --  31  --  30 31  GLUCOSE 79  --  85 105*  --   --  122*  --  129* 97  BUN 37*  --  32* 31*  --   --  47*  --  52* 56*  CREATININE 1.17  --  1.00 0.98  --   --  1.77*  --  1.92* 2.12*  CALCIUM 8.2*  --  7.9* 8.0*  --   --  8.8  --  8.7 8.5  MG 1.4* 2.3  --  2.2  --   --   --   --   --   --   PHOS 3.3  --   --  2.9 3.7 4.1  --  4.8*  --  4.2   Liver Function Tests:  Recent Labs Lab 09/17/12 0445  AST 23  ALT 24  ALKPHOS 127*  BILITOT 2.1*  PROT 5.5*  ALBUMIN 1.3*   CBC:  Recent Labs Lab 09/14/12 0400 09/15/12 0507 09/15/12 1059 09/18/12 0520  WBC 7.3 9.9 12.2* 9.4  NEUTROABS  --   --  10.6*  --   HGB 15.7 13.8 14.8 12.6*  HCT 46.8 41.4 43.0 36.9*  MCV 78.3 78.9 77.6* 77.2*  PLT 47* 45* 48* 80*   Cardiac Enzymes:  Recent Labs Lab 09/13/12 1500 09/15/12 0507 09/16/12 0310 09/16/12 1002 09/16/12 1805  CKTOTAL  --  37  --   --   --   TROPONINI <0.30  --  <0.30 <0.30 <0.30   BNP (last 3 results)  Recent Labs  09/10/12 1710 09/19/12 1200 09/20/12 0515  PROBNP 23656.0* 32000.0* 30294.0*   CBG:  Recent Labs Lab 09/15/12 0845 09/15/12 1215 09/15/12 1624 09/15/12 2031 09/16/12 1657   GLUCAP 71 73 85 73 175*    Recent Results (from the past 240 hour(s))  CLOSTRIDIUM DIFFICILE BY PCR     Status: None   Collection Time    09/11/12 11:13 AM      Result Value Range Status   C difficile by pcr NEGATIVE  NEGATIVE Final  CULTURE, BLOOD (ROUTINE X 2)     Status: None   Collection Time    09/11/12  8:20 PM      Result Value Range Status   Specimen Description BLOOD RIGHT ARM   Final   Special Requests BOTTLES DRAWN AEROBIC AND ANAEROBIC 10CC EA   Final   Culture  Setup Time 09/12/2012 03:24   Final   Culture NO GROWTH 5 DAYS   Final   Report Status 09/18/2012 FINAL   Final  CULTURE, BLOOD (ROUTINE X 2)     Status: None   Collection Time    09/11/12  8:30 PM      Result Value Range Status   Specimen Description BLOOD LEFT HAND   Final   Special Requests BOTTLES DRAWN AEROBIC ONLY 10CC   Final   Culture  Setup Time 09/12/2012 03:24   Final   Culture NO GROWTH 5 DAYS   Final   Report Status 09/18/2012 FINAL   Final  URINE CULTURE     Status: None   Collection Time    09/12/12  6:15 AM      Result Value Range Status   Specimen Description URINE, CATHETERIZED   Final   Special Requests NONE   Final   Culture  Setup Time 09/12/2012 06:44   Final   Colony Count NO GROWTH   Final   Culture NO GROWTH   Final   Report Status 09/13/2012 FINAL   Final     Studies:  Recent x-ray studies have been reviewed in detail by the Attending Physician   Junious Silk, ANP [facility] Office  [phone] Pager [phone]  **If unable to reach the above provider after paging please contact the Flow Manager @ [phone]  On-Call/Text Page:      [email or web address]      password [email or web address]  If 7PM-7AM, please contact night-coverage [email or web address] Password  [email or web address] 09/20/2012, 10:19 AM   LOS: 10 days   I have personally examined this patient and reviewed the entire database. I have reviewed the above note, made any necessary editorial changes, and agree with its content.  Lonia Blood, MD [facility]

## 2012-09-20 NOTE — Progress Notes (Signed)
I have seen and examined the patient and agree with the assessment and plans.  Samaria Anes A. Lorene Samaan  MD, FACS  

## 2012-09-20 NOTE — Progress Notes (Signed)
Physical Therapy Treatment Patient Details Name: Caleb Bauer MRN: [id number] DOB: 1950-03-17 Today's Date: 09/20/2012 Time: 7829-5621 PT Time Calculation (min): 18 min  PT Assessment / Plan / Recommendation  PT Comments   Pt fatigues quickly.  Pt with SaO2 decr but unsure if monitor is always accurate. Used portable monitor and got higher readings.  Follow Up Recommendations  SNF     Does the patient have the potential to tolerate intense rehabilitation     Barriers to Discharge        Equipment Recommendations  None recommended by PT    Recommendations for Other Services    Frequency Min 3X/week   Progress towards PT Goals Progress towards PT goals: Progressing toward goals  Plan Current plan remains appropriate    Precautions / Restrictions Precautions Precautions: Fall   Pertinent Vitals/Pain SaO2 mid 80's on both monitors with transfer to [facility].  At rest portable SaO2 monitor 98%; 84% on other monitor. Pt on 6L O2.    Mobility  Transfers Transfers: Stand Pivot Transfers Sit to Stand: 1: +2 Total assist;With upper extremity assist;With armrests;From chair/3-in-1 Sit to Stand: Patient Percentage: 60% Stand to Sit: 4: Min assist;With upper extremity assist;With armrests;To chair/3-in-1 Stand Pivot Transfers: 1: +2 Total assist;With armrests Stand Pivot Transfers: Patient Percentage: 60% Details for Transfer Assistance: Performed stand pivot chair to [facility] to chair with rolling walker. Assist to bring hips up. Ambulation/Gait Ambulation/Gait Assistance: Not tested (comment) (Pt needed to use BSC/urinal and decr SaO2)    Exercises     PT Diagnosis:    PT Problem List:   PT Treatment Interventions:     PT Goals (current goals can now be found in the care plan section)    Visit Information  Last PT Received On: 09/20/12 Assistance Needed: +2 History of Present Illness: 62 y/o male with DM2, CHF was admitted on 7/11 for cellulitis of his left leg.  On 7/12 he  developed respiratory failure and acute encephalopathy requiring mechanical ventilation. He developed hypotension that was felt to be due to sepsis-PCT was ~ 2 and all cultures have been negative. Subsequent ECHO revealed severe LV systolic dysfunction with ejection fraction of 20-25% with global hypokinesis and markedly dilated left ventricle. Also question of apical mural thrombus. It was surmised he likely had cardiogenic shock. Pt continues to have significant edema.    Subjective Data      Cognition  Cognition Arousal/Alertness: Awake/alert Behavior During Therapy: WFL for tasks assessed/performed Overall Cognitive Status: Within Functional Limits for tasks assessed    Balance  Balance Balance Assessed: Yes Static Standing Balance Static Standing - Balance Support: Bilateral upper extremity supported Static Standing - Level of Assistance: 4: Min assist  End of Session PT - End of Session Activity Tolerance: Patient limited by fatigue;Other (comment) Patient left: in chair;with call bell/phone within reach Nurse Communication: Mobility status   GP     [facility] 09/20/2012, 12:16 PM  [facility] PT [phone]

## 2012-09-20 NOTE — Progress Notes (Signed)
Subjective:  Patient initially admitted to the hospital on 09/10/2012 with fall trying to get out of the bed and lacerating his foot leading to ulceration and cellulitis. Patient recently moved from [place] to [place] to live with his niece.  Patient has history of tobacco use. He has been diagnosed as having congestive heart failure about 2-3 years ago at [facility]. No history of prior cardiac evaluation with regard to invasive procedures. No history of illicit drug use. Patient states dyspnea improved, but no new symptoms.  Patient was started on amiodarone, heart rate much better controlled and no further NSVT.  Objective:  Vital Signs in the last 24 hours: Temp:  [97.3 F (36.3 C)-98.3 F (36.8 C)] 98 F (36.7 C) (07/21 0330) Pulse Rate:  [63-71] 69 (07/21 0330) Resp:  [16-26] 21 (07/21 0330) BP: (92-111)/(44-72) 105/69 mmHg (07/21 0330) SpO2:  [87 %-96 %] 93 % (07/21 0742)  Intake/Output from previous day: 07/20 0701 - 07/21 0700 In: 673.3 [P.O.:474; I.V.:99.3; IV Piggyback:100] Out: 800 [Urine:800]  Physical Exam: General appearance: alert, cooperative, appears older than stated age, no distress and moderately obese  Lungs: wheezes bibasilar and bilaterally  Heart: S1, S2 normal, S3 present and no rub. Abdomen: soft, non-tender; bowel sounds normal; no masses, no organomegaly  Extremities: Bilateral below knee 2 plus edema present, left leg has cellulitis changes in his shin.  Upper extremity also show edema, mild 1 plus pitting. Pulses: 2+ and symmetric  Neurologic: Grossly normal  Lab Results:  Recent Labs  09/18/12 0520  WBC 9.4  HGB 12.6*  PLT 80*    Recent Labs  09/19/12 1200 09/20/12 0515  NA 135 131*  K 4.6 4.3  CL 96 92*  CO2 30 31  GLUCOSE 129* 97  BUN 52* 56*  CREATININE 1.92* 2.12*   BNP (last 3 results)  Recent Labs  09/10/12 1710 09/19/12 1200 09/20/12 0515  PROBNP 23656.0* 32000.0* 30294.0*    09/15/2012: EKG: I  administered a total of 24 mg of intravenous adenosine 12 mg x2 to see whether patient has underlying atrial flutter with 2:1conduction due to persistent tachycardia. EKG revealed essentially sinus tachycardia.  Echo 09/13/2012: Left ventricle: The cavity size was severely dilated. Systolic function was severely reduced. The estimated ejection fraction was in the range of 20% to 25%. Cannot exclude apical thrombus. - Left atrium: The atrium was mildly dilated. - Right ventricle: The cavity size was moderately dilated. - Right atrium: The atrium was mildly dilated. IVC dilated, suggests elevated right heart pressure. Scheduled Meds: . albuterol  2.5 mg Nebulization TID  . amiodarone  200 mg Oral Daily  . aspirin  81 mg Oral Daily  . digoxin  0.125 mg Oral Daily  . feeding supplement  237 mL Oral TID BM  . ipratropium  0.5 mg Nebulization TID  . isosorbide-hydrALAZINE  1 tablet Oral TID  . metoprolol tartrate  12.5 mg Oral BID  . piperacillin-tazobactam  3.375 g Intravenous Q8H  . potassium chloride  20 mEq Oral BID  . predniSONE  20 mg Oral Q breakfast  . sodium chloride  10 mL Intravenous Q12H  . torsemide  20 mg Oral BID   Continuous Infusions: . sodium chloride 20 mL/hr at 09/18/12 0700  . milrinone 0.25 mcg/kg/min (09/20/12 0316)   PRN Meds:.acetaminophen, albuterol, ondansetron (ZOFRAN) IV, ondansetron, sodium chloride   Assessment/Plan:  1. Dilated cardiomyopathy, severe LV systolic dysfunction probably chronic. Left ventricular mural thrombus could not be excluded by transthoracic echocardiogram.  2.  Acute on chronic systolic and diastolic heart failure.  3. NSVT now on amiodarone. Sinus tachycardia also improved, heart rate much better controlled. No further episodes. 4. Diabetes mellitus type 2 uncontrolled.  5. Acure on chronic  renal insufficiency due to low cardiac output. 6. Thrombocytopenia 7. Sepsis, cellulitis  Recommendation: Extremely ill and chronically ill  patient. No option but to try Dopamine to improve COP. I have discussed with the patient regarding critical illness. He wants to be full code and understands development of renal failure. Continue Primacor.   Pamella Pert, M.D. 09/20/2012, 7:47 AM [facility] Pager: [phone] Office: [phone] If no answer: [phone]

## 2012-09-21 DIAGNOSIS — I472 Ventricular tachycardia: Secondary | ICD-10-CM

## 2012-09-21 LAB — BASIC METABOLIC PANEL
BUN: 52 mg/dL — ABNORMAL HIGH (ref 6–23)
CO2: 31 mEq/L (ref 19–32)
Chloride: 99 mEq/L (ref 96–112)
Glucose, Bld: 90 mg/dL (ref 70–99)
Potassium: 4.5 mEq/L (ref 3.5–5.1)

## 2012-09-21 NOTE — Progress Notes (Signed)
Subjective:  Patient initially admitted to the hospital on 09/10/2012 with fall trying to get out of the bed and lacerating his foot leading to ulceration and cellulitis. Patient recently moved from [place] to [place] to live with his niece.  Patient has history of tobacco use. He has been diagnosed as having congestive heart failure about 2-3 years ago at [facility]. No history of prior cardiac evaluation with regard to invasive procedures. No history of illicit drug use. Patient states dyspnea improved, swelling also improved.  Patient was started on amiodarone, no further NSVT. Tolerating Dopamine and Primacor.  Objective:  Vital Signs in the last 24 hours: Temp:  [97.6 F (36.4 C)-98 F (36.7 C)] 98 F (36.7 C) (07/22 0849) Pulse Rate:  [70-80] 72 (07/22 0849) Resp:  [0-24] 13 (07/22 0849) BP: (104-132)/(44-78) 132/44 mmHg (07/22 0849) SpO2:  [86 %-96 %] 93 % (07/22 0849) Weight:  [123.3 kg (271 lb 13.2 oz)] 123.3 kg (271 lb 13.2 oz) (07/22 0600)  Intake/Output from previous day: 07/21 0701 - 07/22 0700 In: 3801.8 [P.O.:980; I.V.:2771.8; IV Piggyback:50] Out: 2002 [Urine:2000; Stool:2]  Physical Exam: General appearance: alert, cooperative, appears older than stated age, no distress and moderately obese  Lungs: wheezes bibasilar and bilaterally  Heart: S1, S2 normal, S3 present and no rub. Abdomen: soft, non-tender; bowel sounds normal; no masses, no organomegaly  Extremities: Bilateral below knee 2 plus edema present, left leg has cellulitis changes in his shin.  Upper extremity also show edema, mild 1 plus pitting. Improved compared to yesterday and I am able to see skin wrinkles today. Pulses: 2+ and symmetric  Neurologic: Grossly normal  Lab Results: No results found for this basename: WBC, HGB, PLT,  in the last 72 hours  Recent Labs  09/20/12 0515 09/21/12 0450  NA 131* 139  K 4.3 4.5  CL 92* 99  CO2 31 31  GLUCOSE 97 90  BUN 56* 52*   CREATININE 2.12* 1.99*   BNP (last 3 results)  Recent Labs  09/19/12 1200 09/20/12 0515 09/21/12 0450  PROBNP 32000.0* 30294.0* 16109.6*    09/15/2012: EKG: I administered a total of 24 mg of intravenous adenosine 12 mg x2 to see whether patient has underlying atrial flutter with 2:1conduction due to persistent tachycardia. EKG revealed essentially sinus tachycardia.  Echo 09/13/2012: Left ventricle: The cavity size was severely dilated. Systolic function was severely reduced. The estimated ejection fraction was in the range of 20% to 25%. Cannot exclude apical thrombus. - Left atrium: The atrium was mildly dilated. - Right ventricle: The cavity size was moderately dilated. - Right atrium: The atrium was mildly dilated. IVC dilated, suggests elevated right heart pressure. Scheduled Meds: . albuterol  2.5 mg Nebulization TID  . amiodarone  200 mg Oral Daily  . aspirin  81 mg Oral Daily  . digoxin  0.125 mg Oral Daily  . feeding supplement  237 mL Oral TID BM  . ipratropium  0.5 mg Nebulization TID  . isosorbide-hydrALAZINE  1 tablet Oral TID  . metoprolol tartrate  12.5 mg Oral BID  . piperacillin-tazobactam  3.375 g Intravenous Q8H  . potassium chloride  20 mEq Oral BID  . predniSONE  20 mg Oral Q breakfast  . sodium chloride  10 mL Intravenous Q12H  . torsemide  20 mg Oral BID   Continuous Infusions: . sodium chloride 20 mL/hr at 09/18/12 0700  . DOPamine 5 mcg/kg/min (09/21/12 0901)  . milrinone 0.25 mcg/kg/min (09/21/12 0800)   PRN Meds:.acetaminophen, albuterol,  ondansetron (ZOFRAN) IV, ondansetron, sodium chloride   Assessment/Plan:  1. Dilated cardiomyopathy, severe LV systolic dysfunction probably chronic. Left ventricular mural thrombus could not be excluded by transthoracic echocardiogram.  2. Acute on chronic systolic and diastolic heart failure.  3. NSVT now on amiodarone. Sinus tachycardia also improved, heart rate much better controlled. No further episodes. 4.  Diabetes mellitus type 2 uncontrolled.  5. Acure on chronic  renal insufficiency due to low cardiac output. 6. Thrombocytopenia 7. Sepsis, cellulitis  Recommendation:.Continue Primacor and Dopamine for 24 to 48 hours more. Unable to monitor Is/Os due to urine leak. Needs daily accurate weights. Up in chair. BP much better on Dopa. S. Cr trending down.   Pamella Pert, M.D. 09/21/2012, 9:50 AM [facility] Pager: [phone] Office: [phone] If no answer: [phone]

## 2012-09-21 NOTE — Progress Notes (Signed)
[facility] Progress Note [place] TEAM 1 - Stepdown/ICU TEAM   Caleb Bauer ZOX:[id number] DOB: 1950/12/06 DOA: 09/10/2012 PCP: Pcp Not In System  Brief narrative: 62 y/o male with DM2, CHF admitted on 7/11 for cellulitis of his left leg. He was evaluated by [facility] and clinical findings were not c/w necrotizing fasciitis. On 7/12 he developed respiratory failure and acute encephalopathy requiring mechanical ventilation. He developed hypotension that was felt to be due to sepsis - PCT was ~ 2.  All cultures have been negative. Subsequent ECHO revealed severe LV systolic dysfunction with ejection fraction of 20-25% with global hypokinesis and markedly dilated left ventricle. Also question of apical mural thrombus. It was surmised he likely had cardiogenic shock.  He does have history of prior admit to [facility] ~ 3 years ago for "heart failure". 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 -Dopamine started 7/21 per Cards to support BP and hopefully then promote better diuresis-UOP appears increased but inaccurate due to urine leaking- Cards starting daily weights -ACE I added 7/18 but dc'd due to progressive renal dysfunction  ? Mural thrombus -not a candidate for chronic AC (see below and Cardiology note from 09/17/2012)    Nonsustained ventricular tachycardia -started on Pacerone and per Cards note at dc can transition to 200 daily -not candidate for ICD implantation due to acute cellulitis, question of noncompliance -in addition it does not appear as if pre admit he had received maximun medical rxn for SHF so this would be first option re: EF may improve after medical rxn -cont Digoxin -rate controlled w/o recurrence - watch  with initiation of Dopamine -Keep K >/= 4.0  Shock -due to cardiac etiology -BP soft/ see above re: Dopamine  Acute renal insufficiency -likely has underlying CKD ([place] c/w medical renal disease)  but baseline Scr unknown -suspect etiology due to low EF in setting of likely diabetic nephropathy -may progress  Diabetes mellitus without complication -currently well controlled -HgbA1c was 6.2  Thrombocytopenia, unspecified -?due to passive hepatic congestion in setting of severe SHF/DCM -baseline unknown -due to this problem he is NOT a candidate for chronic AC -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 but this is non diagnostic  Abnormal transaminases -TB >2.0 but has trended down to 1.4 -as above -Abdominal US - liver unremarkable -likely due to passive hepatic congestion due to severe DCM  Hypertension -controlled and suspect now has degree of chronic hypotension due to low EF/DCM  Cellulitis of LLE -resolving -cont Zosyn  Edema of left upper extremity -same site 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 - No evidence of deep vein or superficial thrombosis involving the left lower extremity and right common femoral vein. - Doppler signals were pulsatile throughout consistent with CHF or fluid overload. - No evidence of Baker's cyst on the left. - Enlargement of the left inguinal lymph nodes noted.  2-D echocardiogram  - Left ventricle: The cavity size was severely dilated. Systolic function was severely reduced. The  estimated ejection fraction was in the range of 20% to 25%. Cannot exclude apical thrombus. - Left atrium: The atrium was mildly dilated. - Right ventricle: The cavity size was moderately dilated. - Right atrium: The atrium was mildly dilated. - Pericardium, extracardiac: A trivial pericardial effusion was identified.  Upper extremity venous duplex/left  No obvious evidence of deep vein or superficial thrombosis involving the left upper extremity   Antibiotics: Zosyn 7/12 >>> Clindamycin 7/12 >>> 7/15 Vancomycin 7/12 >>> 7/15  HPI/Subjective: Patient alert and pleasant. Endorsed improvement in prior malaise but not yet back to baseline. Says pain in left hand much better.  Objective: Blood pressure 115/64, pulse 82, temperature 98 F (36.7 C), temperature source Oral, resp. rate 13, height 6\' 1"  (1.854 m), weight 123.3 kg (271 lb 13.2 oz), SpO2 93.00%.  Intake/Output Summary (Last 24 hours) at 09/21/12 1020 Last data filed at 09/21/12 1000  Gross per 24 hour  Intake 3603.95 ml  Output   2301 ml  Net 1302.95 ml   Exam: General: No acute respiratory distress Lungs: Clear to auscultation bilaterally without wheezes or crackles, RA Cardiovascular: Regular rate and rhythm without murmur gallop or rub normal S1 and S2, no further peripheral edema of legs - previous significant unilateral erythema involving the left upper extremity extending to the hand has improved with near resolution of erythema - has edema of the right and left hands  Abdomen: Nontender, nondistended, soft, bowel sounds positive, no rebound, no ascites, no appreciable mass Musculoskeletal: No significant cyanosis, clubbing of bilateral lower extremities Neurological: Alert and appears oriented x 3  Scheduled Meds: Scheduled Meds: . albuterol  2.5 mg Nebulization TID  . amiodarone  200 mg Oral Daily  . aspirin  81 mg Oral Daily  . digoxin  0.125 mg Oral Daily  . feeding supplement  237 mL Oral TID BM  .  ipratropium  0.5 mg Nebulization TID  . isosorbide-hydrALAZINE  1 tablet Oral TID  . metoprolol tartrate  12.5 mg Oral BID  . piperacillin-tazobactam  3.375 g Intravenous Q8H  . potassium chloride  20 mEq Oral BID  . predniSONE  20 mg Oral Q breakfast  . sodium chloride  10 mL Intravenous Q12H  . torsemide  20 mg Oral BID   Continuous Infusions: . sodium chloride 20 mL/hr at 09/18/12 0700  . DOPamine 5 mcg/kg/min (09/21/12 0901)  . milrinone 0.25 mcg/kg/min (09/21/12 0800)   Data Reviewed: Basic Metabolic Panel:  Recent Labs Lab 09/15/12 0507 09/15/12 1800  09/16/12 0310 09/17/12 0445 09/18/12 0520 09/18/12 0856 09/19/12 0450 09/19/12 1200 09/20/12 0515 09/21/12 0450  NA 151*  --   < > 142  --   --  137  --  135 131* 139  K 2.9*  --   < > 3.7  --   --  4.3  --  4.6 4.3 4.5  CL 107  --   < > 103  --   --  98  --  96 92* 99  CO2 35*  --   < > 36*  --   --  31  --  30 31 31   GLUCOSE 79  --   < > 105*  --   --  122*  --  129* 97 90  BUN 37*  --   < > 31*  --   --  47*  --  52*  56* 52*  CREATININE 1.17  --   < > 0.98  --   --  1.77*  --  1.92* 2.12* 1.99*  CALCIUM 8.2*  --   < > 8.0*  --   --  8.8  --  8.7 8.5 8.6  MG 1.4* 2.3  --  2.2  --   --   --   --   --   --   --   PHOS 3.3  --   --  2.9 3.7 4.1  --  4.8*  --  4.2 3.7  < > = values in this interval not displayed. Liver Function Tests:  Recent Labs Lab 09/17/12 0445  AST 23  ALT 24  ALKPHOS 127*  BILITOT 2.1*  PROT 5.5*  ALBUMIN 1.3*   CBC:  Recent Labs Lab 09/15/12 0507 09/15/12 1059 09/18/12 0520  WBC 9.9 12.2* 9.4  NEUTROABS  --  10.6*  --   HGB 13.8 14.8 12.6*  HCT 41.4 43.0 36.9*  MCV 78.9 77.6* 77.2*  PLT 45* 48* 80*   Cardiac Enzymes:  Recent Labs Lab 09/15/12 0507 09/16/12 0310 09/16/12 1002 09/16/12 1805  CKTOTAL 37  --   --   --   TROPONINI  --  <0.30 <0.30 <0.30   BNP (last 3 results)  Recent Labs  09/19/12 1200 09/20/12 0515 09/21/12 0450  PROBNP 32000.0* 30294.0*  28995.0*   CBG:  Recent Labs Lab 09/15/12 0845 09/15/12 1215 09/15/12 1624 09/15/12 2031 09/16/12 1657  GLUCAP 71 73 85 73 175*    Recent Results (from the past 240 hour(s))  CLOSTRIDIUM DIFFICILE BY PCR     Status: None   Collection Time    09/11/12 11:13 AM      Result Value Range Status   C difficile by pcr NEGATIVE  NEGATIVE Final  CULTURE, BLOOD (ROUTINE X 2)     Status: None   Collection Time    09/11/12  8:20 PM      Result Value Range Status   Specimen Description BLOOD RIGHT ARM   Final   Special Requests BOTTLES DRAWN AEROBIC AND ANAEROBIC 10CC EA   Final   Culture  Setup Time 09/12/2012 03:24   Final   Culture NO GROWTH 5 DAYS   Final   Report Status 09/18/2012 FINAL   Final  CULTURE, BLOOD (ROUTINE X 2)     Status: None   Collection Time    09/11/12  8:30 PM      Result Value Range Status   Specimen Description BLOOD LEFT HAND   Final   Special Requests BOTTLES DRAWN AEROBIC ONLY 10CC   Final   Culture  Setup Time 09/12/2012 03:24   Final   Culture NO GROWTH 5 DAYS   Final   Report Status 09/18/2012 FINAL   Final  URINE CULTURE     Status: None   Collection Time    09/12/12  6:15 AM      Result Value Range Status   Specimen Description URINE, CATHETERIZED   Final   Special Requests NONE   Final   Culture  Setup Time 09/12/2012 06:44   Final   Colony Count NO GROWTH   Final   Culture NO GROWTH   Final   Report Status 09/13/2012 FINAL   Final     Studies:  Recent x-ray studies have been reviewed in detail by the Attending Physician   Junious Silk, ANP [facility] Office  [phone] Pager [phone]  **If unable  to reach the above provider after paging please contact the Flow Manager @ [phone]  On-Call/Text Page:      [email or web address]      password [email or web address]  If 7PM-7AM, please contact night-coverage [email or web address] Password [email or web address] 09/21/2012, 10:20 AM   LOS: 11 days   I have examined the patient, reviewed the chart and modified the above  note which I agree with.   Chandon Lazcano,MD [phone] 09/21/2012, 1:29 PM

## 2012-09-21 NOTE — Progress Notes (Signed)
Patient has a bed offer at [facility] and can go there at time of d/c- will await medical clearance for transfer-  Reece Levy, MSW [phone]

## 2012-09-22 LAB — BASIC METABOLIC PANEL
CO2: 33 mEq/L — ABNORMAL HIGH (ref 19–32)
Chloride: 100 mEq/L (ref 96–112)
Glucose, Bld: 118 mg/dL — ABNORMAL HIGH (ref 70–99)
Potassium: 4.7 mEq/L (ref 3.5–5.1)
Sodium: 140 mEq/L (ref 135–145)

## 2012-09-22 LAB — GLUCOSE, CAPILLARY: Glucose-Capillary: 108 mg/dL — ABNORMAL HIGH (ref 70–99)

## 2012-09-22 LAB — PRO B NATRIURETIC PEPTIDE: Pro B Natriuretic peptide (BNP): 23819 pg/mL — ABNORMAL HIGH (ref 0–125)

## 2012-09-22 MED ORDER — DOPAMINE-DEXTROSE 3.2-5 MG/ML-% IV SOLN: 5.0000 ug/kg/min | INTRAVENOUS | Status: DC

## 2012-09-22 MED ORDER — PREDNISONE 10 MG PO TABS
10.0000 mg | ORAL_TABLET | Freq: Every day | ORAL | Status: AC
  Administered 2012-09-23 – 2012-09-24 (×2): 10 mg via ORAL
  Filled 2012-09-22 (×2): qty 1

## 2012-09-22 MED ORDER — INSULIN ASPART 100 UNIT/ML ~~LOC~~ SOLN
0.0000 [IU] | Freq: Three times a day (TID) | SUBCUTANEOUS | Status: DC
  Administered 2012-09-23: 1 [IU] via SUBCUTANEOUS
  Administered 2012-09-25: 13:00:00 via SUBCUTANEOUS
  Administered 2012-09-27: 1 [IU] via SUBCUTANEOUS

## 2012-09-22 NOTE — Progress Notes (Signed)
Occupational Therapy Treatment Patient Details Name: ASHLEIGH ARYA MRN: [id number] DOB: [birth year] Today's Date: 09/22/2012 Time: 1314-1400 OT Time Calculation (min): 46 min  OT Assessment / Plan / Recommendation  History of present illness 62 y/o male with DM2, CHF was admitted on 7/11 for cellulitis of his left leg.  On 7/12 he developed respiratory failure and acute encephalopathy requiring mechanical ventilation. He developed hypotension that was felt to be due to sepsis-PCT was ~ 2 and all cultures have been negative. Subsequent ECHO revealed severe LV systolic dysfunction with ejection fraction of 20-25% with global hypokinesis and markedly dilated left ventricle. Also question of apical mural thrombus. It was surmised he likely had cardiogenic shock. Pt continues to have significant edema.   Clinical Impression Pt very willing to work with therapy, motivated to show what he is now able to do.  Improvement in  UE edema with pt now able to perform self feeding and grooming with less assist.   OT comments    Follow Up Recommendations  SNF    Barriers to Discharge       Equipment Recommendations  3 in 1 bedside comode    Recommendations for Other Services    Frequency Min 2X/week   Progress towards OT Goals Progress towards OT goals: Progressing toward goals  Plan Discharge plan needs to be updated    Precautions / Restrictions Precautions Precautions: Fall Precaution Comments: improved edema in B hands Restrictions Weight Bearing Restrictions: No   Pertinent Vitals/Pain Monitored throughout, on 6L 02 for ambulation, sats remained in mid 90s, no reported pain    ADL  Eating/Feeding: Set up (no AE) Where Assessed - Eating/Feeding: Edge of bed Grooming: Wash/dry hands;Brushing hair;Supervision/safety Where Assessed - Grooming: Supported standing;Unsupported sitting Lower Body Dressing: +1 Total assistance Where Assessed - Lower Body Dressing: Unsupported sitting Equipment  Used: Gait belt;Rolling walker Transfers/Ambulation Related to ADLs: min guard assist with verbal cues for hand placement with RW ADL Comments: Improvement in B UE edema, pt now able to self feed and perform groomin standing at sink.  Instructed in pacing and purse lip breathing during exertion.    OT Diagnosis:    OT Problem List:   OT Treatment Interventions:     OT Goals(current goals can now be found in the care plan section) Acute Rehab OT Goals Patient Stated Goal: rehab and then home  Visit Information  Last OT Received On: 09/22/12 Assistance Needed: +1 History of Present Illness: 62 y/o male with DM2, CHF was admitted on 7/11 for cellulitis of his left leg.  On 7/12 he developed respiratory failure and acute encephalopathy requiring mechanical ventilation. He developed hypotension that was felt to be due to sepsis-PCT was ~ 2 and all cultures have been negative. Subsequent ECHO revealed severe LV systolic dysfunction with ejection fraction of 20-25% with global hypokinesis and markedly dilated left ventricle. Also question of apical mural thrombus. It was surmised he likely had cardiogenic shock. Pt continues to have significant edema.    Subjective Data      Prior Functioning       Cognition  Cognition Arousal/Alertness: Awake/alert Behavior During Therapy: WFL for tasks assessed/performed Overall Cognitive Status: Within Functional Limits for tasks assessed (conversation sometimes difficult to follow)    Mobility  Bed Mobility Bed Mobility: Supine to Sit Supine to Sit: 4: Min assist;HOB elevated Sitting - Scoot to Edge of Bed: 5: Supervision Transfers Transfers: Sit to Stand;Stand to Sit Sit to Stand: 4: Min guard;With upper extremity assist;From bed  Stand to Sit: 4: Min guard;With upper extremity assist;To chair/3-in-1 Details for Transfer Assistance: Verbal cues for hand placement.    Exercises      Balance Balance Balance Assessed: Yes Static Sitting  Balance Static Sitting - Balance Support: Feet supported Static Sitting - Level of Assistance: 7: Independent Static Sitting - Comment/# of Minutes: 10 Static Standing Balance Static Standing - Balance Support: Right upper extremity supported;During functional activity Static Standing - Level of Assistance: 5: Stand by assistance   End of Session OT - End of Session Activity Tolerance: Patient tolerated treatment well Patient left: in chair;with call bell/phone within reach;with family/visitor present Nurse Communication: Mobility status  GO     Evern Bio 09/22/2012, 2:12 PM [phone]

## 2012-09-22 NOTE — Progress Notes (Signed)
Subjective:  Patient initially admitted to the hospital on 09/10/2012 with fall trying to get out of the bed and lacerating his foot leading to ulceration and cellulitis. Patient recently moved from [place] to [place] to live with his niece.  Patient has history of tobacco use. He has been diagnosed as having congestive heart failure about 2-3 years ago at [facility] No history of prior cardiac evaluation with regard to invasive procedures. No history of illicit drug use.   Patient states dyspnea improved, swelling also improved.  Patient was started on amiodarone, no further NSVT. Tolerating Dopamine and Primacor. Has had ventricular bigeminy overnight. Good diuresis. Weight down by 1kg.  Objective:  Vital Signs in the last 24 hours: Temp:  [97.3 F (36.3 C)-98.2 F (36.8 C)] 97.3 F (36.3 C) (07/23 0828) Pulse Rate:  [67-83] 72 (07/23 0828) Resp:  [8-24] 18 (07/23 0828) BP: (100-136)/(44-94) 131/51 mmHg (07/23 0828) SpO2:  [86 %-94 %] 93 % (07/23 0828) Weight:  [122.1 kg (269 lb 2.9 oz)] 122.1 kg (269 lb 2.9 oz) (07/23 0600)  Intake/Output from previous day: 07/22 0701 - 07/23 0700 In: 1602 [P.O.:840; I.V.:662; IV Piggyback:100] Out: 2125 [Urine:2125]  Physical Exam: General appearance: alert, cooperative, appears older than stated age, no distress and moderately obese  Lungs: wheezes bibasilar and bilaterally. Bronchial breath sounds bilateral at the bases  About 50% of the thorax. Heart: S1, S2 normal, S3 present and no rub. Abdomen: soft, non-tender; bowel sounds normal; no masses, no organomegaly  Extremities: Bilateral below knee 1   plus edema present, left leg has cellulitis changes in his shin.  Upper extremity no edema. Improved compared to yesterday and I am able to see skin wrinkles today. Pulses: 2+ and symmetric  Neurologic: Grossly normal  Lab Results:  Recent Labs  09/21/12 0450 09/22/12 0500  NA 139 140  K 4.5 4.7  CL 99 100  CO2 31 33*   GLUCOSE 90 118*  BUN 52* 48*  CREATININE 1.99* 2.10*   BNP (last 3 results)  Recent Labs  09/20/12 0515 09/21/12 0450 09/22/12 0500  PROBNP 30294.0* 09811.9* 14782.9*    09/15/2012: EKG: I administered a total of 24 mg of intravenous adenosine 12 mg x2 to see whether patient has underlying atrial flutter with 2:1conduction due to persistent tachycardia. EKG revealed essentially sinus tachycardia.  Echo 09/13/2012: Left ventricle: The cavity size was severely dilated. Systolic function was severely reduced. The estimated ejection fraction was in the range of 20% to 25%. Cannot exclude apical thrombus. - Left atrium: The atrium was mildly dilated. - Right ventricle: The cavity size was moderately dilated. - Right atrium: The atrium was mildly dilated. IVC dilated, suggests elevated right heart pressure. Scheduled Meds: . albuterol  2.5 mg Nebulization TID  . amiodarone  200 mg Oral Daily  . aspirin  81 mg Oral Daily  . digoxin  0.125 mg Oral Daily  . feeding supplement  237 mL Oral TID BM  . ipratropium  0.5 mg Nebulization TID  . isosorbide-hydrALAZINE  1 tablet Oral TID  . metoprolol tartrate  12.5 mg Oral BID  . potassium chloride  20 mEq Oral BID  . predniSONE  20 mg Oral Q breakfast  . sodium chloride  10 mL Intravenous Q12H  . torsemide  20 mg Oral BID   Continuous Infusions: . sodium chloride 20 mL/hr at 09/18/12 0700  . DOPamine 5 mcg/kg/min (09/22/12 5621)  . milrinone 0.25 mcg/kg/min (09/22/12 0700)   PRN Meds:.acetaminophen, albuterol, ondansetron (ZOFRAN)  IV, ondansetron, sodium chloride   Assessment/Plan:  1. Dilated cardiomyopathy, severe LV systolic dysfunction probably chronic. Left ventricular mural thrombus could not be excluded by transthoracic echocardiogram.  2. Acute on chronic systolic and diastolic heart failure.  3. NSVT now on amiodarone. Sinus tachycardia also improved, heart rate much better controlled. No further episodes. 4. Diabetes mellitus  type 2 uncontrolled.  5. Acure on chronic  renal insufficiency due to low cardiac output. 6. Thrombocytopenia 7. Sepsis, cellulitis 8. Bilateral lung atelectasis. > 50% bibasilar.   Recommendation:. He has diuresed as much as we could and I will turn off primacor and wean off dopamine. Needs aggressive respiratory therapy and chest PT. Ambulation and physical therapy is an issue. Continue Bidil, dig, metoprolol and Demadex 20 mg BID for now. Otherwise please call if problems. Probably needs IP rehab. Poor long-term prognosis. I will see him in the office on Aug 12th at 1:30 pm.   Pamella Pert, M.D. 09/22/2012, 8:37 AM [facility] Pager: [phone] Office: [phone] If no answer: [phone]

## 2012-09-22 NOTE — Progress Notes (Signed)
Continuing to work towards SNF at d/c- [facility] has offered with the Medicaid pending- as long as bed is available when stable-  Will ask MD for tentative d/c date and advise  Reece Levy, MSW [phone]

## 2012-09-22 NOTE — Progress Notes (Signed)
Physical Therapy Treatment Patient Details Name: Caleb Bauer MRN: [id number] DOB: 1950-04-14 Today's Date: 09/22/2012 Time: 1335-1400 PT Time Calculation (min): 25 min  PT Assessment / Plan / Recommendation  History of Present Illness 62 y/o male with DM2, CHF was admitted on 7/11 for cellulitis of his left leg.  On 7/12 he developed respiratory failure and acute encephalopathy requiring mechanical ventilation. He developed hypotension that was felt to be due to sepsis-PCT was ~ 2 and all cultures have been negative. Subsequent ECHO revealed severe LV systolic dysfunction with ejection fraction of 20-25% with global hypokinesis and markedly dilated left ventricle. Also question of apical mural thrombus. It was surmised he likely had cardiogenic shock. Pt continues to have significant edema.   Clinical Impression Pt progressing well with OOB mobility however is not safe to d/c home without 24/7 assist due to pt with increased edema x 4 extremities, increased falls risk and generalized weakness. Pt to benefit from SNF upon d/c to address mention deficits and achieve safe mod I function for safe transition home.   PT Comments     Follow Up Recommendations  SNF     Does the patient have the potential to tolerate intense rehabilitation     Barriers to Discharge        Equipment Recommendations  Rolling walker with 5" wheels    Recommendations for Other Services    Frequency Min 3X/week   Progress towards PT Goals Progress towards PT goals: Progressing toward goals  Plan Current plan remains appropriate    Precautions / Restrictions Precautions Precautions: Fall Precaution Comments: improved edema in B hands Restrictions Weight Bearing Restrictions: No   Pertinent Vitals/Pain Pt denies pain. Noted edema x 4 extremities R >L    Mobility  Bed Mobility Bed Mobility: Not assessed Supine to Sit: 4: Min assist;HOB elevated Sitting - Scoot to Edge of Bed: 5:  Supervision Transfers Transfers: Sit to Stand;Stand to Sit Sit to Stand: 4: Min guard;With upper extremity assist;From bed Stand to Sit: 4: Min guard;With upper extremity assist;To chair/3-in-1 Details for Transfer Assistance: v/c's for hand placement Ambulation/Gait Ambulation/Gait Assistance: 4: Min assist Ambulation Distance (Feet): 120 Feet Assistive device: Rolling walker Ambulation/Gait Assistance Details: initially pt very slow and guarded then progressed to more fluid gait pattern and increased cadence. v/c's for walker management during turns. mild SOB. pt on 5 LO2 via [place]. SpO2 staying around 90% Gait Pattern: Decreased stride length;Step-through pattern Stairs: No    Exercises     PT Diagnosis:    PT Problem List:   PT Treatment Interventions:     PT Goals (current goals can now be found in the care plan section) Acute Rehab PT Goals Patient Stated Goal: rehab and then home  Visit Information  Last PT Received On: 09/22/12 Assistance Needed: +1 History of Present Illness: 62 y/o male with DM2, CHF was admitted on 7/11 for cellulitis of his left leg.  On 7/12 he developed respiratory failure and acute encephalopathy requiring mechanical ventilation. He developed hypotension that was felt to be due to sepsis-PCT was ~ 2 and all cultures have been negative. Subsequent ECHO revealed severe LV systolic dysfunction with ejection fraction of 20-25% with global hypokinesis and markedly dilated left ventricle. Also question of apical mural thrombus. It was surmised he likely had cardiogenic shock. Pt continues to have significant edema.    Subjective Data  Subjective: Pt received sitting EOB with OT. pt with noted edema x 4 extremities. R UE/LE > L UE/LE Patient  Stated Goal: rehab and then home   Cognition  Cognition Arousal/Alertness: Awake/alert Behavior During Therapy: WFL for tasks assessed/performed Overall Cognitive Status: Within Functional Limits for tasks assessed     Balance  Balance Balance Assessed: Yes Static Sitting Balance Static Sitting - Balance Support: Feet supported;No upper extremity supported Static Sitting - Level of Assistance: 7: Independent Static Sitting - Comment/# of Minutes: 10 Static Standing Balance Static Standing - Balance Support: Bilateral upper extremity supported Static Standing - Level of Assistance: 5: Stand by assistance  End of Session PT - End of Session Equipment Utilized During Treatment: Gait belt;Oxygen (5Lo2 via ) Activity Tolerance: Patient limited by fatigue;Other (comment) Patient left: in chair;with call bell/phone within reach;with family/visitor present Nurse Communication: Mobility status   GP     Marcene Brawn 09/22/2012, 4:05 PM  Lewis Shock, PT, DPT Pager #: [phone] Office #: [phone]

## 2012-09-22 NOTE — Progress Notes (Signed)
Scale c

## 2012-09-22 NOTE — Progress Notes (Signed)
[facility] Progress Note [place] TEAM 1 - Stepdown/ICU TEAM   Caleb Bauer XBM:[id number] DOB: Dec 31, 1950 DOA: 09/10/2012 PCP: Pcp Not In System  Brief narrative: 62 y/o male with DM2, CHF admitted on 7/11 for cellulitis of his left leg. He was evaluated by [facility] and clinical findings were not c/w necrotizing fasciitis. On 7/12 he developed respiratory failure and acute encephalopathy requiring mechanical ventilation. He developed hypotension that was felt to be due to sepsis - PCT was ~ 2.  All cultures have been negative. Subsequent ECHO revealed severe LV systolic dysfunction with ejection fraction of 20-25% with global hypokinesis and markedly dilated left ventricle. Also question of apical mural thrombus. It was surmised he likely had cardiogenic shock.  He does have history of prior admit to [facility] ~ 3 years ago for "heart failure". 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  SIGNIFICANT EVENTS / STUDIES:  7/11 admission  7/12 transfer to ICU, intubation - care to [facility] 7/12 Ven Dopp Negative for DVT  7/14- neg 1.5 liters  7/15-off pressors, extubated  7/17 - NSVT 7/18 - [facility] resumed care of pt   Assessment/Plan:  Acute respiratory failure with hypoxia & Hypercapnia due to Acute systolic congestive heart failure/dilated CM, NYHA class 4 -Cards following -cont Demadex, Lopressor and Bidil -Milrinone and Dopamine utilized to support diuresis and dc'd 7/23 -ACE I added 7/18 but dc'd due to progressive renal dysfunction Cards says would not use -abnormal CXR with persistent bibasilar atlx due to bed rest- cont IS and add flutter valve if can perform better.  ? Mural thrombus -not a candidate for chronic AC (see below and Cardiology note from 09/17/2012)    Nonsustained ventricular tachycardia -started on Pacerone and per Cards note at dc can transition to 200 daily -not candidate for ICD implantation due to acute cellulitis, question of  noncompliance -in addition it does not appear as if pre admit he had received maximun medical rxn for SHF so this would be first option re: EF may improve after medical rxn -cont Digoxin -rate controlled w/o recurrence -Keep K >/= 4.0  Shock -due to cardiac etiology - resolved  Acute renal insufficiency -likely has underlying CKD ([place] c/w medical renal disease)  but baseline Scr unknown -suspect etiology due to low EF in setting of likely diabetic nephropathy -no ACE or ARB  Diabetes mellitus without complication -currently well controlled -HgbA1c was 6.2  Thrombocytopenia, unspecified -?due to passive hepatic congestion in setting of severe SHF/DCM -baseline unknown -due to this problem he is NOT a candidate for chronic AC at this time  -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 but this is non diagnostic  Abnormal transaminases -Abdominal US - liver unremarkable -likely due to passive hepatic congestion due to severe DCM -recheck in AM  Hypertension -controlled and suspect now has degree of chronic hypotension due to low EF/DCM  Cellulitis of LLE -resolving -Zosyn dc'd 7/23 (total 10 days rxn)  Edema of left upper extremity -same site as old IV -Venous duplex: no DVT  Erythema left hand -?cellulitis from old IV -completed course of abx -on Prednisone daily per PCCM due to concern of rheumatalogic etiolgoy - no evidence of such so weaning steroids to off   DVT prophylaxis: SCDs Code Status: FULL Family Communication: Patient Disposition Plan: Transfer to Telemetry - PT/OT recommends SNF at [facility]  Consultants: Cardiology  Procedures: Lower extremity venous duplex - Technically difficult due to the tighness and swelling of the left lower extremity. -  No evidence of deep vein or superficial thrombosis involving the left lower extremity and right common femoral vein. - Doppler signals were pulsatile throughout consistent with CHF or fluid overload. - No evidence of Baker's cyst on  the left. - Enlargement of the left inguinal lymph nodes noted.  2-D echocardiogram  - Left ventricle: The cavity size was severely dilated. Systolic function was severely reduced. The estimated ejection fraction was in the range of 20% to 25%. Cannot exclude apical thrombus. - Left atrium: The atrium was mildly dilated. - Right ventricle: The cavity size was moderately dilated. - Right atrium: The atrium was mildly dilated. - Pericardium, extracardiac: A trivial pericardial effusion was identified.  Upper extremity venous duplex/left  No obvious evidence of deep vein or superficial thrombosis involving the left upper extremity   Antibiotics: Zosyn 7/12 >>> 7/22 Clindamycin 7/12 >>> 7/15 Vancomycin 7/12 >>> 7/15  HPI/Subjective: Patient alert and pleasant. No complaints at time of exam.  Objective: Blood pressure 100/55, pulse 67, temperature 98 F (36.7 C), temperature source Oral, resp. rate 14, height 6\' 1"  (1.854 m), weight 122.1 kg (269 lb 2.9 oz), SpO2 95.00%.  Intake/Output Summary (Last 24 hours) at 09/22/12 1312 Last data filed at 09/22/12 1114  Gross per 24 hour  Intake 1096.6 ml  Output   2701 ml  Net -1604.4 ml   Exam: General: No acute respiratory distress Lungs: Clear to auscultation bilaterally without wheezes or crackles, RA Cardiovascular: Regular rate and rhythm without murmur gallop or rub normal S1 and S2, 1+ edema of B LE - previous significant unilateral erythema involving the left upper extremity extending to the hand has nearly resolved with complete resolution of erythema - has edema of the right and left hands  Abdomen: Nontender, nondistended, soft, bowel sounds positive, no rebound, no ascites, no appreciable mass Musculoskeletal: No significant cyanosis, clubbing of bilateral lower extremities Neurological: Alert and appears oriented x 3  Scheduled Meds: Scheduled Meds: . albuterol  2.5 mg Nebulization TID  . amiodarone  200 mg Oral Daily   . aspirin  81 mg Oral Daily  . digoxin  0.125 mg Oral Daily  . feeding supplement  237 mL Oral TID BM  . ipratropium  0.5 mg Nebulization TID  . isosorbide-hydrALAZINE  1 tablet Oral TID  . metoprolol tartrate  12.5 mg Oral BID  . potassium chloride  20 mEq Oral BID  . predniSONE  20 mg Oral Q breakfast  . sodium chloride  10 mL Intravenous Q12H  . torsemide  20 mg Oral BID   Data Reviewed: Basic Metabolic Panel:  Recent Labs Lab 09/15/12 1800  09/16/12 0310  09/18/12 0520 09/18/12 0856 09/19/12 0450 09/19/12 1200 09/20/12 0515 09/21/12 0450 09/22/12 0500  NA  --   < > 142  --   --  137  --  135 131* 139 140  K  --   < > 3.7  --   --  4.3  --  4.6 4.3 4.5 4.7  CL  --   < > 103  --   --  98  --  96 92* 99 100  CO2  --   < > 36*  --   --  31  --  30 31 31  33*  GLUCOSE  --   < > 105*  --   --  122*  --  129* 97 90 118*  BUN  --   < > 31*  --   --  47*  --  52* 56* 52* 48*  CREATININE  --   < > 0.98  --   --  1.77*  --  1.92* 2.12* 1.99* 2.10*  CALCIUM  --   < > 8.0*  --   --  8.8  --  8.7 8.5 8.6 8.8  MG 2.3  --  2.2  --   --   --   --   --   --   --   --   PHOS  --   --  2.9  < > 4.1  --  4.8*  --  4.2 3.7 3.3  < > = values in this interval not displayed. Liver Function Tests:  Recent Labs Lab 09/17/12 0445  AST 23  ALT 24  ALKPHOS 127*  BILITOT 2.1*  PROT 5.5*  ALBUMIN 1.3*   CBC:  Recent Labs Lab 09/18/12 0520  WBC 9.4  HGB 12.6*  HCT 36.9*  MCV 77.2*  PLT 80*   Cardiac Enzymes:  Recent Labs Lab 09/16/12 0310 09/16/12 1002 09/16/12 1805  TROPONINI <0.30 <0.30 <0.30   BNP (last 3 results)  Recent Labs  09/20/12 0515 09/21/12 0450 09/22/12 0500  PROBNP 30294.0* 28995.0* 23819.0*   CBG:  Recent Labs Lab 09/15/12 1624 09/15/12 2031 09/16/12 1657  GLUCAP 85 73 175*    Studies:  Recent x-ray studies have been reviewed in detail by the Attending Physician   Junious Silk, ANP [facility] Office  [phone] Pager  [phone]  **If unable to reach the above provider after paging please contact the Flow Manager @ [phone]  On-Call/Text Page:      [email or web address]      password [email or web address]  If 7PM-7AM, please contact night-coverage [email or web address] Password [email or web address] 09/22/2012, 1:12 PM   LOS: 12 days   I have personally examined this patient and reviewed the entire database. I have reviewed the above note, made any necessary editorial changes, and agree with its content.  Lonia Blood, MD [facility]

## 2012-09-23 LAB — GLUCOSE, CAPILLARY
Glucose-Capillary: 83 mg/dL (ref 70–99)
Glucose-Capillary: 129 mg/dL — ABNORMAL HIGH (ref 70–99)
Glucose-Capillary: 77 mg/dL (ref 70–99)

## 2012-09-23 LAB — CBC
HCT: 38.3 % — ABNORMAL LOW (ref 39.0–52.0)
Hemoglobin: 12.9 g/dL — ABNORMAL LOW (ref 13.0–17.0)
MCV: 76.9 fL — ABNORMAL LOW (ref 78.0–100.0)
RDW: 20.5 % — ABNORMAL HIGH (ref 11.5–15.5)
WBC: 6.5 10*3/uL (ref 4.0–10.5)

## 2012-09-23 LAB — COMPREHENSIVE METABOLIC PANEL
ALT: 30 U/L (ref 0–53)
Albumin: 1.7 g/dL — ABNORMAL LOW (ref 3.5–5.2)
Alkaline Phosphatase: 85 U/L (ref 39–117)
BUN: 47 mg/dL — ABNORMAL HIGH (ref 6–23)
Calcium: 8.8 mg/dL (ref 8.4–10.5)
Potassium: 4.8 mEq/L (ref 3.5–5.1)
Sodium: 137 mEq/L (ref 135–145)
Total Protein: 6 g/dL (ref 6.0–8.3)

## 2012-09-23 MED ORDER — GLUCERNA SHAKE PO LIQD
237.0000 mL | ORAL | Status: DC
  Administered 2012-09-24 – 2012-09-25 (×2): 237 mL via ORAL

## 2012-09-23 MED ORDER — ADULT MULTIVITAMIN W/MINERALS CH
1.0000 | ORAL_TABLET | Freq: Every day | ORAL | Status: DC
  Administered 2012-09-23 – 2012-09-27 (×5): 1 via ORAL
  Filled 2012-09-23 (×5): qty 1

## 2012-09-23 NOTE — Progress Notes (Signed)
NUTRITION FOLLOW UP  Intervention:   Decrease Glucerna Shake to po daily, each supplement provides 220 kcal and 10 grams of protein. Add MVI daily. RD to continue to follow nutrition care plan.  Nutrition Dx:   Inadequate oral intake now related to decreased appetite as evidenced by meal completion of 50%; resolved.  New Goal: Pt to meet >/= 90% of their estimated nutrition needs, met.  Monitor:   PO intake, supplement acceptance, weight trend  Assessment:   Pt with DM2 and CHF was admitted for cellulitis of his left leg. On 7/12 he developed respiratory failure and acute encephalopathy requiring mechanical ventilation. Pt also with stage II on buttocks. Pt extubated 7/15.  Consuming 100% of meals. Glucerna Shakes ordered.   Height: Ht Readings from Last 1 Encounters:  09/22/12 6\' 1"  (1.854 m)    Weight Status:   Wt Readings from Last 1 Encounters:  09/23/12 264 lb 15.9 oz (120.2 kg)  Admission weight 271 lb 7/11 - pt being diuresed  Re-estimated needs:  Kcal: 2000-2300 Protein: 105-115 grams Fluid: 1.5 L/day  Skin:  stage II buttocks, cellulitis   Diet Order: Cardiac Meal Completion: 75-100%    Intake/Output Summary (Last 24 hours) at 09/23/12 1036 Last data filed at 09/23/12 0920  Gross per 24 hour  Intake    760 ml  Output   2701 ml  Net  -1941 ml    Last BM: 7/21   Labs:   Recent Labs Lab 09/20/12 0515 09/21/12 0450 09/22/12 0500 09/23/12 0450  NA 131* 139 140 137  K 4.3 4.5 4.7 4.8  CL 92* 99 100 98  CO2 31 31 33* 30  BUN 56* 52* 48* 47*  CREATININE 2.12* 1.99* 2.10* 2.04*  CALCIUM 8.5 8.6 8.8 8.8  PHOS 4.2 3.7 3.3  --   GLUCOSE 97 90 118* 67*    CBG (last 3)   Recent Labs  09/22/12 2136 09/23/12 0613  GLUCAP 108* 83    Scheduled Meds: . albuterol  2.5 mg Nebulization TID  . amiodarone  200 mg Oral Daily  . aspirin  81 mg Oral Daily  . digoxin  0.125 mg Oral Daily  . feeding supplement  237 mL Oral TID BM  . insulin aspart   0-9 Units Subcutaneous TID WC  . ipratropium  0.5 mg Nebulization TID  . isosorbide-hydrALAZINE  1 tablet Oral TID  . metoprolol tartrate  12.5 mg Oral BID  . potassium chloride  20 mEq Oral BID  . predniSONE  10 mg Oral Q breakfast  . torsemide  20 mg Oral BID    Continuous Infusions: . sodium chloride 20 mL/hr at 09/22/12 2000   Jarold Motto MS, RD, LDN Pager: [phone] After-hours pager: [phone]
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
## 2012-09-23 NOTE — Progress Notes (Signed)
.  Patient transferred to unit  4E.  I have reported to CSW covering who will proceed with d/c plans for SNF at d/c.  Patient is LTC Medicaid pending.  Reece Levy, MSW, Theresia Majors [phone]

## 2012-09-24 LAB — GLUCOSE, CAPILLARY: Glucose-Capillary: 111 mg/dL — ABNORMAL HIGH (ref 70–99)

## 2012-09-24 LAB — BASIC METABOLIC PANEL
Calcium: 8.7 mg/dL (ref 8.4–10.5)
GFR calc Af Amer: 42 mL/min — ABNORMAL LOW (ref >90)
GFR calc non Af Amer: 36 mL/min — ABNORMAL LOW (ref >90)
Glucose, Bld: 88 mg/dL (ref 70–99)
Potassium: 4.3 mEq/L (ref 3.5–5.1)
Sodium: 137 mEq/L (ref 135–145)

## 2012-09-24 LAB — PRO B NATRIURETIC PEPTIDE: Pro B Natriuretic peptide (BNP): 25436 pg/mL — ABNORMAL HIGH (ref 0–125)

## 2012-09-24 MED ORDER — IPRATROPIUM BROMIDE 0.02 % IN SOLN: 0.5000 mg | Freq: Three times a day (TID) | RESPIRATORY_TRACT | Status: DC

## 2012-09-24 MED ORDER — TORSEMIDE 20 MG PO TABS: 40.0000 mg | ORAL_TABLET | Freq: Two times a day (BID) | ORAL | Status: DC

## 2012-09-24 MED ORDER — ISOSORB DINITRATE-HYDRALAZINE 20-37.5 MG PO TABS: 1.0000 | ORAL_TABLET | Freq: Three times a day (TID) | ORAL | Status: DC

## 2012-09-24 MED ORDER — PREDNISONE 5 MG PO TABS: 5.0000 mg | ORAL_TABLET | Freq: Every day | ORAL | Status: DC

## 2012-09-24 MED ORDER — METOPROLOL TARTRATE 12.5 MG HALF TABLET: 12.5000 mg | ORAL_TABLET | Freq: Two times a day (BID) | ORAL | Status: DC

## 2012-09-24 MED ORDER — AMIODARONE HCL 200 MG PO TABS: 200.0000 mg | ORAL_TABLET | Freq: Every day | ORAL | Status: DC

## 2012-09-24 MED ORDER — GLUCERNA SHAKE PO LIQD: 237.0000 mL | ORAL | Status: DC

## 2012-09-24 MED ORDER — ALBUTEROL SULFATE (5 MG/ML) 0.5% IN NEBU: 2.5000 mg | INHALATION_SOLUTION | Freq: Three times a day (TID) | RESPIRATORY_TRACT | Status: DC

## 2012-09-24 MED ORDER — DIGOXIN 125 MCG PO TABS: 0.1250 mg | ORAL_TABLET | Freq: Every day | ORAL | Status: DC

## 2012-09-24 NOTE — Plan of Care (Signed)
Problem: Phase I Progression Outcomes Goal: EF % per last Echo/documented,Core Reminder form on chart Outcome: Completed/Met Date Met:  09/24/12 EF 20 -25% from ECHO 09/13/12.
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
## 2012-09-24 NOTE — Progress Notes (Signed)
CSW has spoken with admissions staff at [facility] to determine if they would accept patient with Letter of Guarantee. Each time- CSW was informed that they would "check with administration" and call me back but did not do so. CSW has determined now that there is a possible bed offer from [facility].  CSW is attempting to reach RN liason - Reyne Dumas regarding this.  DC summary is in place; patient is stable for d/c per MD.  Will ask weekend CSW to see if she can possibly facilitate a d/c for tomorrow.  Lorri Frederick. [place]  [phone]

## 2012-09-24 NOTE — Progress Notes (Signed)
Physical Therapy Treatment Patient Details Name: Caleb Bauer MRN: [id number] DOB: 29-May-1950 Today's Date: 09/24/2012 Time: 4098-1191 PT Time Calculation (min): 25 min  PT Assessment / Plan / Recommendation  History of Present Illness 62 y/o male with DM2, CHF was admitted on 7/11 for cellulitis of his left leg.  On 7/12 he developed respiratory failure and acute encephalopathy requiring mechanical ventilation. He developed hypotension that was felt to be due to sepsis-PCT was ~ 2 and all cultures have been negative. Subsequent ECHO revealed severe LV systolic dysfunction with ejection fraction of 20-25% with global hypokinesis and markedly dilated left ventricle. Also question of apical mural thrombus. It was surmised he likely had cardiogenic shock. Pt continues to have significant edema.   Clinical Impression progressing   PT Comments   Pt OOB in recliner on 2 lts O2 with sats @ 98%.  Amb with RW on RA with lowest sats 90% but avg 92% with mild c/o dyspnea.  Much improved amb distance and tolerance.    Follow Up Recommendations  SNF Medicaid pending     Does the patient have the potential to tolerate intense rehabilitation     Barriers to Discharge        Equipment Recommendations  Rolling walker with 5" wheels    Recommendations for Other Services    Frequency     Progress towards PT Goals Progress towards PT goals: Progressing toward goals  Plan      Precautions / Restrictions Precautions Precautions: Fall Precaution Comments: monitor RA sats/L LE cellulitis (oozing) Restrictions Weight Bearing Restrictions: No    Pertinent Vitals/Pain No c/o pain    Mobility  Bed Mobility Bed Mobility: Not assessed Details for Bed Mobility Assistance: Pt OOB in recliner Transfers Transfers: Sit to Stand;Stand to Sit Sit to Stand: 5: Supervision;4: Min guard (Min Guard for lines/O2 tubing) Stand to Sit: 5: Supervision;4: Min guard Details for Transfer Assistance: MinGuard for  lines/leads/O2 tubing Ambulation/Gait Ambulation/Gait Assistance: 5: Supervision;4: Min guard Ambulation Distance (Feet): 240 Feet (120' x 2 one standing rest break) Assistive device: Rolling walker Ambulation/Gait Assistance Details: Pt on 2 lts on arrival with sats 98% so amb on RA lowest sats 90% with avg 92% and only mild c/o dyspnea.  Gait Pattern: Decreased stride length;Step-through pattern;Wide base of support Gait velocity: decreased     PT Goals (current goals can now be found in the care plan section)    Visit Information  Last PT Received On: 09/24/12 Assistance Needed: +1 History of Present Illness: 62 y/o male with DM2, CHF was admitted on 7/11 for cellulitis of his left leg.  On 7/12 he developed respiratory failure and acute encephalopathy requiring mechanical ventilation. He developed hypotension that was felt to be due to sepsis-PCT was ~ 2 and all cultures have been negative. Subsequent ECHO revealed severe LV systolic dysfunction with ejection fraction of 20-25% with global hypokinesis and markedly dilated left ventricle. Also question of apical mural thrombus. It was surmised he likely had cardiogenic shock. Pt continues to have significant edema.    Subjective Data      Cognition       Balance     End of Session PT - End of Session Equipment Utilized During Treatment: Gait belt Activity Tolerance: Patient tolerated treatment well Patient left: in chair;with call bell/phone within reach   Felecia Shelling  PTA [facility]  Acute  Rehab Pager      [phone]

## 2012-09-24 NOTE — Progress Notes (Signed)
Pt had a run of 25 beats of VT on monitor, VSS, no c/o pain, no distress noticed. MD notified.

## 2012-09-24 NOTE — Progress Notes (Signed)
Pt resting comfortable on bed, VSS, pt on RA no distress noticed. Pt gets some confuse after MN. We'll continue with POC

## 2012-09-24 NOTE — Progress Notes (Signed)
Pt assessment completed, pt denies any pain or discomfort at this time, up on chair at this moment no family member at the bedside, no distress noticed. Education reinforcement given to the pt about fluid restriction.

## 2012-09-25 LAB — BASIC METABOLIC PANEL
CO2: 31 mEq/L (ref 19–32)
Calcium: 8.9 mg/dL (ref 8.4–10.5)
Creatinine, Ser: 1.95 mg/dL — ABNORMAL HIGH (ref 0.50–1.35)
GFR calc Af Amer: 41 mL/min — ABNORMAL LOW (ref >90)
GFR calc non Af Amer: 35 mL/min — ABNORMAL LOW (ref >90)
Sodium: 136 mEq/L (ref 135–145)

## 2012-09-25 LAB — GLUCOSE, CAPILLARY: Glucose-Capillary: 117 mg/dL — ABNORMAL HIGH (ref 70–99)

## 2012-09-25 LAB — PRO B NATRIURETIC PEPTIDE: Pro B Natriuretic peptide (BNP): 26983 pg/mL — ABNORMAL HIGH (ref 0–125)

## 2012-09-25 NOTE — Progress Notes (Signed)
Weekend CSW informed that [facility] is unable to offer placement. Currently [facility] is not working so CSW unable to follow up on any bed offers.  Theresia Bough, MSW, Theresia Majors [phone]
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
## 2012-09-25 NOTE — Progress Notes (Signed)
Left leg continues to weep.  IV therapy has accessed client for restart of peripheral line, they are unable to start and will call another therapist to assess.  I have taken a look at arms that are edematous and could not find vein to attempt to start a new IV site.

## 2012-09-26 LAB — BASIC METABOLIC PANEL
BUN: 44 mg/dL — ABNORMAL HIGH (ref 6–23)
CO2: 33 mEq/L — ABNORMAL HIGH (ref 19–32)
Calcium: 8.9 mg/dL (ref 8.4–10.5)
Chloride: 97 mEq/L (ref 96–112)
Creatinine, Ser: 1.82 mg/dL — ABNORMAL HIGH (ref 0.50–1.35)
Glucose, Bld: 95 mg/dL (ref 70–99)

## 2012-09-26 LAB — GLUCOSE, CAPILLARY
Glucose-Capillary: 89 mg/dL (ref 70–99)
Glucose-Capillary: 96 mg/dL (ref 70–99)
Glucose-Capillary: 102 mg/dL — ABNORMAL HIGH (ref 70–99)

## 2012-09-26 LAB — PRO B NATRIURETIC PEPTIDE: Pro B Natriuretic peptide (BNP): 34516 pg/mL — ABNORMAL HIGH (ref 0–125)

## 2012-09-26 MED ORDER — FUROSEMIDE 10 MG/ML IJ SOLN
40.0000 mg | Freq: Once | INTRAMUSCULAR | Status: AC
  Administered 2012-09-26: 40 mg via INTRAVENOUS

## 2012-09-26 NOTE — Progress Notes (Addendum)
CSW continuing to follow for SNF placement.  CSW reviewed current bed offers and pt does not have any bed offers in [place].  CSW met with pt at bedside to discuss. CSW discussed that there are currently no bed offers in [facility] and explored with pt about expanding SNF search to counties around [facility].  Pt is agreeable to expanding SNF search to surrounding counties.  Pt request CSW contact pt niece, Sharlyne Cai [phone]) to discuss. CSW contacted pt niece, but no answer and no voice message to leave a message.  CSW expanded SNF search to [place], [facility], [facility], and [facility].  Weekday CSW to follow up re: bed offers and assist with pt disposition needs.   Addendum 10:45am:  CSW received return phone call from pt niece. CSW discussed need to expand SNF search. Pt niece stated that pt family would be able to be most support of pt if pt in [facility] or [place]. CSW discussed limitations of Medicaid as primary payor, but will attempt to seek placement in [facility] of [place].  Weekday CSW to follow up with bed offers and assist with d/c needs.   Jacklynn Lewis, MSW, [employer]  Clinical Social Work Weekend coverage [phone]
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
## 2012-09-26 NOTE — Progress Notes (Signed)
IV infiltrated. No complaints from patient. IV intact. MD notified. Ok to leave IV access out due to possible discharge tomorrow.

## 2012-09-26 NOTE — Progress Notes (Signed)
Patient denies any pain. Currently resting comfortably, will continue to monitor. Troy Sine

## 2012-09-27 LAB — GLUCOSE, CAPILLARY
Glucose-Capillary: 110 mg/dL — ABNORMAL HIGH (ref 70–99)
Glucose-Capillary: 126 mg/dL — ABNORMAL HIGH (ref 70–99)

## 2012-09-27 LAB — BASIC METABOLIC PANEL
CO2: 35 mEq/L — ABNORMAL HIGH (ref 19–32)
Calcium: 9 mg/dL (ref 8.4–10.5)
Creatinine, Ser: 1.77 mg/dL — ABNORMAL HIGH (ref 0.50–1.35)
GFR calc non Af Amer: 40 mL/min — ABNORMAL LOW (ref >90)
Glucose, Bld: 83 mg/dL (ref 70–99)
Sodium: 137 mEq/L (ref 135–145)

## 2012-09-27 MED ORDER — TORSEMIDE 20 MG PO TABS
40.0000 mg | ORAL_TABLET | Freq: Two times a day (BID) | ORAL | Status: DC
  Administered 2012-09-27: 40 mg via ORAL
  Filled 2012-09-27 (×2): qty 2

## 2012-09-27 MED ORDER — TORSEMIDE 20 MG PO TABS: 40.0000 mg | ORAL_TABLET | Freq: Two times a day (BID) | ORAL | Status: DC

## 2012-09-27 MED ORDER — PANTOPRAZOLE SODIUM 40 MG PO TBEC
40.0000 mg | DELAYED_RELEASE_TABLET | Freq: Every day | ORAL | Status: DC
  Administered 2012-09-27: 40 mg via ORAL
  Filled 2012-09-27: qty 1

## 2012-09-27 MED ORDER — PANTOPRAZOLE SODIUM 40 MG PO TBEC: 40.0000 mg | DELAYED_RELEASE_TABLET | Freq: Every day | ORAL | Status: DC

## 2012-09-27 NOTE — Progress Notes (Signed)
Patient resting comfortably, no complaints of pain. Will continue to monitor. Caleb Bauer

## 2012-09-27 NOTE — Progress Notes (Signed)
Pt. Alarmed 5 beats v-tach.  Pt. Was asleep and asymptomatic when awoken.  VSS. Leona Singleton, RN primary nurse made aware.  Will continue to monitor patient
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
## 2012-09-27 NOTE — Progress Notes (Signed)
Report given to receiving RN. No questions or concerns at this time. Patient is stable and appears in no acute distress or discomfort.  

## 2012-09-27 NOTE — Progress Notes (Addendum)
Patient discharged via EMS in no acute distress. Patient a&ox4.  No c/o pain. Tele D/c'd. Louretta Parma, RN

## 2012-09-28 NOTE — Clinical Social Work Placement (Signed)
     Clinical Social Work Department CLINICAL SOCIAL WORK PLACEMENT NOTE 09/28/2012  Patient:  Caleb Bauer, Caleb Bauer  Account Number:  [id number] Admit date:  09/10/2012  Clinical Social Worker:  Pollyann Savoy, LCSW  Date/time:  09/19/2012 04:30 PM  Clinical Social Work is seeking post-discharge placement for this patient at the following level of care:   SKILLED NURSING   (*CSW will update this form in Epic as items are completed)   09/19/2012  Patient/family provided with [facility] Department of Clinical Social Works list of facilities offering this level of care within the geographic area requested by the patient (or if unable, by the patients family).  09/19/2012  Patient/family informed of their freedom to choose among providers that offer the needed level of care, that participate in Medicare, Medicaid or managed care program needed by the patient, have an available bed and are willing to accept the patient.  09/19/2012  Patient/family informed of [facility] ownership interest in [facility], as well as of the fact that they are under no obligation to receive care at this facility.  PASARR submitted to EDS on 09/19/2012 PASARR number received from EDS on 09/19/2012  FL2 transmitted to all facilities in geographic area requested by pt/family on  09/19/2012 FL2 transmitted to all facilities within larger geographic area on   Patient informed that his/her managed care company has contracts with or will negotiate with  certain facilities, including the following:   Medicaid only     Patient/family informed of bed offers received:  09/27/2012 Patient chooses bed at OTHER Physician recommends and patient chooses bed at    Patient to be transferred to OTHER on  09/27/2012 Patient to be transferred to facility by Ambulance  Sharin Mons)  The following physician request were entered in Epic:   Additional Comments: 09/27/12  Patient d/c'd today to [facility]formerly)  [facility].  Patient chose this facility from 3 bed offers.  He is Medicaid only.  CSW spoke to Stuart at the facility - he stated that he would request prior approval as patient is Medicaid only.  [name] and his neice Lawonda [phone] was agreeable to d/c plan.  CSW signing off.  Lorri Frederick. Kizzi Overbey, LCSWA  [phone]

## 2012-10-01 DIAGNOSIS — K56609 Unspecified intestinal obstruction, unspecified as to partial versus complete obstruction: Secondary | ICD-10-CM

## 2012-10-01 HISTORY — DX: Unspecified intestinal obstruction, unspecified as to partial versus complete obstruction: K56.609

## 2012-10-06 ENCOUNTER — Encounter (HOSPITAL_COMMUNITY): Payer: Self-pay | Admitting: *Deleted

## 2012-10-06 ENCOUNTER — Emergency Department (HOSPITAL_COMMUNITY): Payer: Medicare Other

## 2012-10-06 ENCOUNTER — Inpatient Hospital Stay (HOSPITAL_COMMUNITY)
Admission: EM | Admit: 2012-10-06 | Discharge: 2012-10-14 | DRG: 388 | Disposition: A | Discharge status: Skilled Nursing Facility | Payer: Medicare Other | Attending: Internal Medicine | Admitting: Internal Medicine

## 2012-10-06 DIAGNOSIS — K560 Paralytic ileus: Principal | ICD-10-CM | POA: Diagnosis present

## 2012-10-06 DIAGNOSIS — K922 Gastrointestinal hemorrhage, unspecified: Secondary | ICD-10-CM

## 2012-10-06 DIAGNOSIS — J189 Pneumonia, unspecified organism: Secondary | ICD-10-CM

## 2012-10-06 DIAGNOSIS — I472 Ventricular tachycardia, unspecified: Secondary | ICD-10-CM | POA: Diagnosis not present

## 2012-10-06 DIAGNOSIS — D696 Thrombocytopenia, unspecified: Secondary | ICD-10-CM

## 2012-10-06 DIAGNOSIS — L02419 Cutaneous abscess of limb, unspecified: Secondary | ICD-10-CM | POA: Diagnosis present

## 2012-10-06 DIAGNOSIS — I428 Other cardiomyopathies: Secondary | ICD-10-CM | POA: Diagnosis present

## 2012-10-06 DIAGNOSIS — I5021 Acute systolic (congestive) heart failure: Secondary | ICD-10-CM

## 2012-10-06 DIAGNOSIS — I10 Essential (primary) hypertension: Secondary | ICD-10-CM

## 2012-10-06 DIAGNOSIS — I252 Old myocardial infarction: Secondary | ICD-10-CM

## 2012-10-06 DIAGNOSIS — I5042 Chronic combined systolic (congestive) and diastolic (congestive) heart failure: Secondary | ICD-10-CM

## 2012-10-06 DIAGNOSIS — I4729 Other ventricular tachycardia: Secondary | ICD-10-CM | POA: Diagnosis not present

## 2012-10-06 DIAGNOSIS — R112 Nausea with vomiting, unspecified: Secondary | ICD-10-CM

## 2012-10-06 DIAGNOSIS — B9689 Other specified bacterial agents as the cause of diseases classified elsewhere: Secondary | ICD-10-CM | POA: Diagnosis present

## 2012-10-06 DIAGNOSIS — I5022 Chronic systolic (congestive) heart failure: Secondary | ICD-10-CM | POA: Diagnosis present

## 2012-10-06 DIAGNOSIS — E119 Type 2 diabetes mellitus without complications: Secondary | ICD-10-CM

## 2012-10-06 DIAGNOSIS — J9601 Acute respiratory failure with hypoxia: Secondary | ICD-10-CM

## 2012-10-06 DIAGNOSIS — I509 Heart failure, unspecified: Secondary | ICD-10-CM | POA: Diagnosis present

## 2012-10-06 DIAGNOSIS — E876 Hypokalemia: Secondary | ICD-10-CM | POA: Diagnosis not present

## 2012-10-06 DIAGNOSIS — F17200 Nicotine dependence, unspecified, uncomplicated: Secondary | ICD-10-CM | POA: Diagnosis present

## 2012-10-06 DIAGNOSIS — J9819 Other pulmonary collapse: Secondary | ICD-10-CM | POA: Diagnosis present

## 2012-10-06 DIAGNOSIS — N179 Acute kidney failure, unspecified: Secondary | ICD-10-CM

## 2012-10-06 DIAGNOSIS — E860 Dehydration: Secondary | ICD-10-CM

## 2012-10-06 DIAGNOSIS — E875 Hyperkalemia: Secondary | ICD-10-CM | POA: Diagnosis present

## 2012-10-06 DIAGNOSIS — I959 Hypotension, unspecified: Secondary | ICD-10-CM

## 2012-10-06 DIAGNOSIS — K56609 Unspecified intestinal obstruction, unspecified as to partial versus complete obstruction: Secondary | ICD-10-CM

## 2012-10-06 DIAGNOSIS — N390 Urinary tract infection, site not specified: Secondary | ICD-10-CM | POA: Diagnosis present

## 2012-10-06 DIAGNOSIS — F411 Generalized anxiety disorder: Secondary | ICD-10-CM | POA: Diagnosis present

## 2012-10-06 LAB — COMPREHENSIVE METABOLIC PANEL
Albumin: 2.3 g/dL — ABNORMAL LOW (ref 3.5–5.2)
BUN: 84 mg/dL — ABNORMAL HIGH (ref 6–23)
Calcium: 9.5 mg/dL (ref 8.4–10.5)
GFR calc Af Amer: 17 mL/min — ABNORMAL LOW (ref >90)
Glucose, Bld: 123 mg/dL — ABNORMAL HIGH (ref 70–99)
Total Protein: 8.4 g/dL — ABNORMAL HIGH (ref 6.0–8.3)

## 2012-10-06 LAB — GASTRIC OCCULT BLOOD (1-CARD TO LAB): Occult Blood, Gastric: POSITIVE — AB

## 2012-10-06 LAB — CBC WITH DIFFERENTIAL/PLATELET
Basophils Absolute: 0 10*3/uL (ref 0.0–0.1)
HCT: 50.6 % (ref 39.0–52.0)
Hemoglobin: 17.2 g/dL — ABNORMAL HIGH (ref 13.0–17.0)
Lymphocytes Relative: 25 % (ref 12–46)
Lymphs Abs: 2.1 10*3/uL (ref 0.7–4.0)
MCV: 76.1 fL — ABNORMAL LOW (ref 78.0–100.0)
Monocytes Absolute: 0.8 10*3/uL (ref 0.1–1.0)
Monocytes Relative: 10 % (ref 3–12)
Neutro Abs: 5.4 10*3/uL (ref 1.7–7.7)
RBC: 6.65 MIL/uL — ABNORMAL HIGH (ref 4.22–5.81)
WBC: 8.3 10*3/uL (ref 4.0–10.5)

## 2012-10-06 LAB — DIGOXIN LEVEL: Digoxin Level: 0.7 ng/mL — ABNORMAL LOW (ref 0.8–2.0)

## 2012-10-06 LAB — SAMPLE TO BLOOD BANK

## 2012-10-06 LAB — POCT I-STAT TROPONIN I: Troponin i, poc: 0.04 ng/mL (ref 0.00–0.08)

## 2012-10-06 MED ORDER — SODIUM CHLORIDE 0.9 % IV BOLUS (SEPSIS)
500.0000 mL | Freq: Once | INTRAVENOUS | Status: AC
  Administered 2012-10-06: via INTRAVENOUS

## 2012-10-06 MED ORDER — ONDANSETRON HCL 4 MG/2ML IJ SOLN
4.0000 mg | Freq: Once | INTRAMUSCULAR | Status: AC
  Administered 2012-10-06: 4 mg via INTRAVENOUS
  Filled 2012-10-06: qty 2

## 2012-10-06 NOTE — ED Provider Notes (Signed)
CSN: [id number]     Arrival date & time 10/06/12  2228 History     First MD Initiated Contact with Patient 10/06/12 2300     Chief Complaint  Patient presents with  . Emesis   (Consider location/radiation/quality/duration/timing/severity/associated sxs/prior Treatment) HPI 62 yo male presents to the ER via EMS with reported n/v x 4 weeks.  Pt is a very poor historian, seems frustrated with any questioning.  He reports he vomits every day, usually before eating.  After vomiting, he feels better and is able to eat "ok". I spoke with his niece, Rowan Blase, who reports she is not happy with the care he is receiving at the nursing facility.  She feels that when he was d/c on 7/28 from [facility], he was doing well, walking with a walker and overall improving.  Since being at the nursing facility she thinks that they have been leaving him in a walker, not giving him good physical therapy, and not giving him a diabetic diet.  He began vomiting after meals per the niece soon after arrival there, usually after drinking kool-aid.  She is worried that his leg is becoming infected again.  She does not want him to return to the nursing home.  Past Medical History  Diagnosis Date  . Diabetes mellitus without complication   . CHF (congestive heart failure)   . Hypertension   . Cardiomyopathy, dilated    History reviewed. No pertinent past surgical history. No family history on file. History  Substance Use Topics  . Smoking status: Current Every Day Smoker -- 0.25 packs/day for .5 years    Types: Cigarettes  . Smokeless tobacco: Not on file  . Alcohol Use: No    Review of Systems  Unable to perform ROS: Other  Pt refuses to answer ROS questions  Allergies  Other  Home Medications   Current Outpatient Rx  Name  Route  Sig  Dispense  Refill  . ALPRAZolam (XANAX) 0.25 MG tablet   Oral   Take 0.25 mg by mouth 3 (three) times daily as needed for anxiety.         Marland Kitchen amiodarone (PACERONE) 200 MG tablet   Oral   Take 1 tablet (200 mg total) by mouth daily.   30 tablet   0   . aspirin EC 81 MG tablet   Oral   Take 81 mg by mouth daily.         . cholecalciferol (VITAMIN D) 1000 UNITS tablet   Oral   Take 1,000 Units by mouth daily.         . ciprofloxacin (CIPRO) 250 MG tablet   Oral   Take 250 mg by mouth 2 (two) times daily. For 2 weeks, Start 8.5.14 end 8.18.14         . digoxin (LANOXIN) 0.125 MG tablet   Oral   Take 1 tablet (0.125 mg total) by mouth daily.   30 tablet   10   . ipratropium-albuterol (DUONEB) 0.5-2.5 (3) MG/3ML SOLN   Nebulization   Take 3 mLs by nebulization 4 (four) times daily.         Marland Kitchen ipratropium-albuterol (DUONEB) 0.5-2.5 (3) MG/3ML SOLN   Nebulization   Take 3 mLs by nebulization every 3 (three) hours as needed (for wheezing /shortness of breath).         . isosorbide-hydrALAZINE (BIDIL) 20-37.5 MG per tablet   Oral   Take 1 tablet by mouth 3 (three) times daily.   120 tablet  10   . metoCLOPramide (REGLAN) 5 MG tablet   Oral   Take 5 mg by mouth 4 (four) times daily -  before meals and at bedtime. For 1 week. Start 8.4.14 ending 8.10.14         . metoprolol tartrate (LOPRESSOR) 12.5 mg TABS   Oral   Take 0.5 tablets (12.5 mg total) by mouth 2 (two) times daily.   60 tablet   0   . Multiple Vitamin (MULTIVITAMIN WITH MINERALS) TABS tablet   Oral   Take 1 tablet by mouth daily.         Marland Kitchen omeprazole (PRILOSEC) 40 MG capsule   Oral   Take 40 mg by mouth daily.         . potassium chloride SA (K-DUR,KLOR-CON) 20 MEQ tablet   Oral   Take 40 mEq by mouth daily.         . predniSONE (DELTASONE) 5 MG tablet   Oral   Take 2.5-15 mg by mouth daily. 15mg  for 4 days, then 10 mg for 4 days, then 5 mg for 4 days, then 2.5 mg for 4 days, then stop         . promethazine (PHENERGAN) 25 MG/ML injection   Intravenous   Inject 25 mg into the vein every 6 (six) hours as needed for nausea or vomiting. For 3 days start 8.6.14  end 8.8.14         . torsemide (DEMADEX) 20 MG tablet   Oral   Take 2 tablets (40 mg total) by mouth 2 (two) times daily.   120 tablet   2    BP 81/64  Pulse 90  Temp(Src) 97.9 F (36.6 C) (Oral)  Resp 31  SpO2 98% Physical Exam  Nursing note and vitals reviewed. Constitutional: He is oriented to person, place, and time.  Chronically ill appearing  HENT:  Head: Normocephalic and atraumatic.  Nose: Nose normal.  Mouth/Throat: Oropharynx is clear and moist.  Eyes: Conjunctivae and EOM are normal. Pupils are equal, round, and reactive to light.  Neck: Normal range of motion. Neck supple. No JVD present. No tracheal deviation present. No thyromegaly present.  Cardiovascular: Normal rate, regular rhythm, normal heart sounds and intact distal pulses.  Exam reveals no gallop and no friction rub.   No murmur heard. Pulmonary/Chest: Effort normal. No stridor. No respiratory distress. He has no wheezes. He has rales. He exhibits no tenderness.  Tachypnea noted.  Diminished in the bases, crackles noted  Abdominal: Soft. Bowel sounds are normal. He exhibits distension. He exhibits no mass. There is no tenderness. There is no rebound and no guarding.  Musculoskeletal: He exhibits edema. He exhibits no tenderness.  Lymphadenopathy:    He has no cervical adenopathy.  Neurological: He is alert and oriented to person, place, and time. He exhibits normal muscle tone. Coordination normal.  Skin: Skin is warm and dry. No rash noted. No erythema. No pallor.  Psychiatric: He has a normal mood and affect. His behavior is normal. Judgment and thought content normal.    ED Course   Procedures (including critical care time)  Labs Reviewed  DIGOXIN LEVEL - Abnormal; Notable for the following:    Digoxin Level 0.7 (*)    All other components within normal limits  PRO B NATRIURETIC PEPTIDE - Abnormal; Notable for the following:    Pro B Natriuretic peptide (BNP) 33980.0 (*)    All other  components within normal limits  COMPREHENSIVE METABOLIC PANEL - Abnormal;  Notable for the following:    Potassium 5.3 (*)    Chloride 94 (*)    Glucose, Bld 123 (*)    BUN 84 (*)    Creatinine, Ser 4.13 (*)    Total Protein 8.4 (*)    Albumin 2.3 (*)    Total Bilirubin 1.6 (*)    GFR calc non Af Amer 14 (*)    GFR calc Af Amer 17 (*)    All other components within normal limits  CBC WITH DIFFERENTIAL - Abnormal; Notable for the following:    RBC 6.65 (*)    Hemoglobin 17.2 (*)    MCV 76.1 (*)    MCH 25.9 (*)    RDW 19.6 (*)    Platelets 129 (*)    All other components within normal limits  POCT GASTRIC OCCULT BLOOD (1-CARD TO LAB) - Abnormal; Notable for the following:    Occult Blood, Gastric POSITIVE (*)    All other components within normal limits  PH, GASTRIC FLUID (GASTROCCULT CARD)  POCT I-STAT TROPONIN I  SAMPLE TO BLOOD BANK   Dg Chest Portable 1 View  10/06/2012   *RADIOLOGY REPORT*  Clinical Data: Emesis  PORTABLE CHEST - 1 VIEW  Comparison: 09/19/2012  Findings: Cardiomegaly evident with vascular congestion.  Dense consolidation in the lingula and left lower lobe compatible with pneumonia.  Associated left effusion not excluded.  No pneumothorax.  Exam is rotated to the left.  IMPRESSION: Dense lingula and left lower lobe consolidation/collapse. Associated left effusion suspected.  Cardiomegaly with vascular congestion   Original Report Authenticated By: Judie Petit. Miles Costain, M.D.    Date: 10/06/2012  Rate:87  Rhythm: normal sinus rhythm  QRS Axis: left  Intervals: normal  ST/T Wave abnormalities: normal  Conduction Disutrbances:right bundle branch block  Narrative Interpretation:   Old EKG Reviewed: unchanged   1. Acute renal failure   2. GI bleed   3. Nausea and vomiting   4. HCAP (healthcare-associated pneumonia)   5. Dehydration   6. CHF (congestive heart failure), chronic, combined     MDM  62 yo male with n/v for several days, per family decline since d/c to  nursing facility.  Low BPs here, labs showing dehydration, new acute renal failure.  CXR with possible left sided pna, hcap vs aspiration.  Will d/w hospitalist for admission.    Olivia Mackie, MD 10/07/12 [phone]

## 2012-10-06 NOTE — ED Notes (Addendum)
Here from [facility]. Transported per family request to ED by [facility] transport out of [place]. Pt brought all belongings to ED "because he may not return to [facility]", "suppose to meet family here", h/o htn, DM and CHF. C/c and only complaint is nv. Denies pain. Now admits to some sob. Alert, NAD, calm, interactive, resps e/u, speaking in clear complete sentences. Last PO intake 1700. cbg 105. nv onset after starting a new med, unsure of what the new med was, NH unsure per EMS. Pitting edema noted in ankles.

## 2012-10-06 NOTE — ED Notes (Signed)
Lawanda @ [phone] Windy Kalata D

## 2012-10-06 NOTE — ED Notes (Signed)
Pt vomited brown emesis

## 2012-10-06 NOTE — ED Notes (Signed)
BP noted to be low, IV established, zofran given, Dr. Norlene Campbell at [facility].

## 2012-10-07 ENCOUNTER — Encounter (HOSPITAL_COMMUNITY): Payer: Self-pay | Admitting: *Deleted

## 2012-10-07 ENCOUNTER — Inpatient Hospital Stay (HOSPITAL_COMMUNITY): Payer: Medicare Other

## 2012-10-07 DIAGNOSIS — N179 Acute kidney failure, unspecified: Secondary | ICD-10-CM

## 2012-10-07 DIAGNOSIS — R112 Nausea with vomiting, unspecified: Secondary | ICD-10-CM | POA: Diagnosis present

## 2012-10-07 DIAGNOSIS — E860 Dehydration: Secondary | ICD-10-CM

## 2012-10-07 DIAGNOSIS — K56609 Unspecified intestinal obstruction, unspecified as to partial versus complete obstruction: Secondary | ICD-10-CM

## 2012-10-07 DIAGNOSIS — I959 Hypotension, unspecified: Secondary | ICD-10-CM

## 2012-10-07 DIAGNOSIS — J9600 Acute respiratory failure, unspecified whether with hypoxia or hypercapnia: Secondary | ICD-10-CM

## 2012-10-07 DIAGNOSIS — E119 Type 2 diabetes mellitus without complications: Secondary | ICD-10-CM

## 2012-10-07 DIAGNOSIS — R142 Eructation: Secondary | ICD-10-CM

## 2012-10-07 DIAGNOSIS — J189 Pneumonia, unspecified organism: Secondary | ICD-10-CM | POA: Diagnosis present

## 2012-10-07 DIAGNOSIS — R143 Flatulence: Secondary | ICD-10-CM

## 2012-10-07 DIAGNOSIS — R141 Gas pain: Secondary | ICD-10-CM

## 2012-10-07 LAB — URINE MICROSCOPIC-ADD ON

## 2012-10-07 LAB — CBC
MCV: 76.1 fL — ABNORMAL LOW (ref 78.0–100.0)
Platelets: 122 10*3/uL — ABNORMAL LOW (ref 150–400)
RBC: 6.16 MIL/uL — ABNORMAL HIGH (ref 4.22–5.81)
RDW: 19.5 % — ABNORMAL HIGH (ref 11.5–15.5)
WBC: 8.4 10*3/uL (ref 4.0–10.5)

## 2012-10-07 LAB — RENAL FUNCTION PANEL
Albumin: 1.9 g/dL — ABNORMAL LOW (ref 3.5–5.2)
Calcium: 8.9 mg/dL (ref 8.4–10.5)
GFR calc Af Amer: 17 mL/min — ABNORMAL LOW (ref >90)
GFR calc non Af Amer: 14 mL/min — ABNORMAL LOW (ref >90)
Glucose, Bld: 115 mg/dL — ABNORMAL HIGH (ref 70–99)
Phosphorus: 6.6 mg/dL — ABNORMAL HIGH (ref 2.3–4.6)
Potassium: 4.4 mEq/L (ref 3.5–5.1)
Sodium: 139 mEq/L (ref 135–145)

## 2012-10-07 LAB — URINALYSIS, ROUTINE W REFLEX MICROSCOPIC
Ketones, ur: NEGATIVE mg/dL
Nitrite: NEGATIVE
Protein, ur: NEGATIVE mg/dL
pH: 5 (ref 5.0–8.0)

## 2012-10-07 LAB — EXPECTORATED SPUTUM ASSESSMENT W GRAM STAIN, RFLX TO RESP C

## 2012-10-07 LAB — GLUCOSE, CAPILLARY
Glucose-Capillary: 130 mg/dL — ABNORMAL HIGH (ref 70–99)
Glucose-Capillary: 137 mg/dL — ABNORMAL HIGH (ref 70–99)

## 2012-10-07 LAB — BASIC METABOLIC PANEL
CO2: 25 mEq/L (ref 19–32)
Calcium: 8.9 mg/dL (ref 8.4–10.5)
Chloride: 96 mEq/L (ref 96–112)
GFR calc Af Amer: 17 mL/min — ABNORMAL LOW (ref >90)
Sodium: 140 mEq/L (ref 135–145)

## 2012-10-07 LAB — STREP PNEUMONIAE URINARY ANTIGEN: Strep Pneumo Urinary Antigen: NEGATIVE

## 2012-10-07 LAB — PROCALCITONIN: Procalcitonin: 4.19 ng/mL

## 2012-10-07 MED ORDER — ALPRAZOLAM 0.25 MG PO TABS: 0.2500 mg | ORAL_TABLET | Freq: Three times a day (TID) | ORAL | Status: DC | PRN

## 2012-10-07 MED ORDER — PREDNISONE 5 MG PO TABS
5.0000 mg | ORAL_TABLET | Freq: Every day | ORAL | Status: DC
  Filled 2012-10-07 (×2): qty 1

## 2012-10-07 MED ORDER — LORAZEPAM 2 MG/ML IJ SOLN: 0.5000 mg | Freq: Four times a day (QID) | INTRAMUSCULAR | Status: DC | PRN

## 2012-10-07 MED ORDER — DEXTROSE 5 % IV SOLN
1.0000 g | Freq: Once | INTRAVENOUS | Status: AC
  Administered 2012-10-07: 1 g via INTRAVENOUS
  Filled 2012-10-07: qty 1

## 2012-10-07 MED ORDER — METOCLOPRAMIDE HCL 5 MG PO TABS
5.0000 mg | ORAL_TABLET | Freq: Three times a day (TID) | ORAL | Status: DC
  Administered 2012-10-07 (×3): 5 mg via ORAL
  Filled 2012-10-07 (×5): qty 1

## 2012-10-07 MED ORDER — IPRATROPIUM BROMIDE 0.02 % IN SOLN
0.5000 mg | Freq: Four times a day (QID) | RESPIRATORY_TRACT | Status: DC
  Administered 2012-10-07 – 2012-10-09 (×9): 0.5 mg via RESPIRATORY_TRACT
  Filled 2012-10-07 (×9): qty 2.5

## 2012-10-07 MED ORDER — METHYLPREDNISOLONE SODIUM SUCC 40 MG IJ SOLR
40.0000 mg | Freq: Every day | INTRAMUSCULAR | Status: DC
  Administered 2012-10-07 – 2012-10-09 (×3): 40 mg via INTRAVENOUS
  Filled 2012-10-07 (×3): qty 1

## 2012-10-07 MED ORDER — PANTOPRAZOLE SODIUM 40 MG PO TBEC
80.0000 mg | DELAYED_RELEASE_TABLET | Freq: Every day | ORAL | Status: DC
  Administered 2012-10-07: 80 mg via ORAL
  Filled 2012-10-07 (×2): qty 1

## 2012-10-07 MED ORDER — PREDNISONE 2.5 MG PO TABS: 2.5000 mg | ORAL_TABLET | Freq: Every day | ORAL | Status: DC

## 2012-10-07 MED ORDER — ALBUTEROL SULFATE (5 MG/ML) 0.5% IN NEBU
2.5000 mg | INHALATION_SOLUTION | Freq: Four times a day (QID) | RESPIRATORY_TRACT | Status: DC
  Administered 2012-10-07 – 2012-10-09 (×9): 2.5 mg via RESPIRATORY_TRACT
  Filled 2012-10-07 (×9): qty 0.5

## 2012-10-07 MED ORDER — SODIUM CHLORIDE 0.9 % IV BOLUS (SEPSIS)
500.0000 mL | Freq: Once | INTRAVENOUS | Status: AC
  Administered 2012-10-07: 18:00:00 via INTRAVENOUS

## 2012-10-07 MED ORDER — AMIODARONE HCL 200 MG PO TABS
200.0000 mg | ORAL_TABLET | Freq: Every day | ORAL | Status: DC
  Administered 2012-10-07: 200 mg via ORAL
  Filled 2012-10-07: qty 1

## 2012-10-07 MED ORDER — DIGOXIN 0.0625 MG HALF TABLET
0.0625 mg | ORAL_TABLET | Freq: Every day | ORAL | Status: DC
  Administered 2012-10-07: 0.0625 mg via ORAL
  Filled 2012-10-07: qty 1

## 2012-10-07 MED ORDER — IPRATROPIUM BROMIDE 0.02 % IN SOLN: 0.5000 mg | RESPIRATORY_TRACT | Status: DC | PRN

## 2012-10-07 MED ORDER — IPRATROPIUM-ALBUTEROL 0.5-2.5 (3) MG/3ML IN SOLN: 3.0000 mL | RESPIRATORY_TRACT | Status: DC | PRN

## 2012-10-07 MED ORDER — DEXTROSE 5 % IV SOLN
1.0000 g | INTRAVENOUS | Status: AC
  Administered 2012-10-07 – 2012-10-10 (×4): 1 g via INTRAVENOUS
  Filled 2012-10-07 (×5): qty 1

## 2012-10-07 MED ORDER — IPRATROPIUM-ALBUTEROL 0.5-2.5 (3) MG/3ML IN SOLN: 3.0000 mL | Freq: Four times a day (QID) | RESPIRATORY_TRACT | Status: DC

## 2012-10-07 MED ORDER — VANCOMYCIN HCL 10 G IV SOLR
1750.0000 mg | Freq: Once | INTRAVENOUS | Status: AC
  Administered 2012-10-07: 1750 mg via INTRAVENOUS
  Filled 2012-10-07: qty 1750

## 2012-10-07 MED ORDER — ALBUTEROL SULFATE (5 MG/ML) 0.5% IN NEBU
2.5000 mg | INHALATION_SOLUTION | RESPIRATORY_TRACT | Status: DC | PRN
  Filled 2012-10-07: qty 0.5

## 2012-10-07 MED ORDER — VANCOMYCIN HCL IN DEXTROSE 1-5 GM/200ML-% IV SOLN
1000.0000 mg | INTRAVENOUS | Status: DC
  Administered 2012-10-08: 1000 mg via INTRAVENOUS
  Filled 2012-10-07 (×2): qty 200

## 2012-10-07 MED ORDER — SODIUM CHLORIDE 0.9 % IV SOLN
INTRAVENOUS | Status: DC
  Administered 2012-10-07 – 2012-10-08 (×4): via INTRAVENOUS

## 2012-10-07 MED ORDER — ONDANSETRON HCL 4 MG/2ML IJ SOLN: 4.0000 mg | Freq: Four times a day (QID) | INTRAMUSCULAR | Status: DC | PRN

## 2012-10-07 MED ORDER — AMIODARONE IV BOLUS ONLY 150 MG/100ML
150.0000 mg | Freq: Once | INTRAVENOUS | Status: AC
  Administered 2012-10-07: 150 mg via INTRAVENOUS
  Filled 2012-10-07: qty 100

## 2012-10-07 MED ORDER — PANTOPRAZOLE SODIUM 40 MG IV SOLR
40.0000 mg | Freq: Every day | INTRAVENOUS | Status: DC
  Administered 2012-10-07 – 2012-10-09 (×3): 40 mg via INTRAVENOUS
  Filled 2012-10-07 (×5): qty 40

## 2012-10-07 NOTE — ED Notes (Signed)
Attempted 2nd IV again, unsuccessful.

## 2012-10-07 NOTE — Progress Notes (Signed)
ANTIBIOTIC CONSULT NOTE - FOLLOW UP  Pharmacy Consult for vancomycin Indication: PNA  Allergies  Allergen Reactions  . Other Nausea And Vomiting    Patient stated drug is "kerein"    Patient Measurements: Height: 6' (182.9 cm) Weight: 217 lb 2.5 oz (98.5 kg) IBW/kg (Calculated) : 77.6  Vital Signs: Temp: 97.9 F (36.6 C) (08/07 0813) Temp src: Oral (08/07 0813) BP: 115/85 mmHg (08/07 1000) Pulse Rate: 91 (08/07 1000) Intake/Output from previous day: 08/06 0701 - 08/07 0700 In: 1000 [I.V.:500; IV Piggyback:500] Out: -  Intake/Output from this shift: Total I/O In: 375 [I.V.:375] Out: 300 [Urine:300]  Labs:  Recent Labs  10/06/12 2305 10/07/12 0930 10/07/12 0934  WBC 8.3  --  8.4  HGB 17.2*  --  16.0  PLT 129*  --  122*  CREATININE 4.13* 4.13* 4.06*   Estimated Creatinine Clearance: 23.2 ml/min (by C-G formula based on Cr of 4.06). No results found for this basename: VANCOTROUGH, VANCOPEAK, VANCORANDOM, GENTTROUGH, GENTPEAK, GENTRANDOM, TOBRATROUGH, TOBRAPEAK, TOBRARND, AMIKACINPEAK, AMIKACINTROU, AMIKACIN,  in the last 72 hours     Assessment: 62 yo male from SNF with concern for PNA (HCAP vs aspiration) on vancomycin and cefepime. WBC= 8.4, afeb. SCr= 4.06 and CrCl ~20.   8/7 vanc>> 8/7 cefepime>>  8/7 resp- recollect suggested 8/7 blood x2-  Goal of Therapy:  Vancomycin trough level 15-20 mcg/ml  Plan:  -Will continue vancomycin as 1000mg  IV q24hr -Will follow renal function, cultures and clinical progress  Harland German, Pharm D 10/07/2012 11:18 AM

## 2012-10-07 NOTE — Care Management Note (Signed)
    Page 1 of 1   10/07/2012     8:59:19 AM   CARE MANAGEMENT NOTE 10/07/2012  Patient:  Caleb Bauer, Caleb Bauer   Account Number:  [id number]  Date Initiated:  10/07/2012  Documentation initiated by:  Junius Creamer  Subjective/Objective Assessment:   dehydrated, pneumonia     Action/Plan:   from nsg facility, sw ref made   Anticipated DC Date:     Anticipated DC Plan:    In-house referral  Clinical Social Worker      DC Planning Services  CM consult      Choice offered to / List presented to:             Status of service:   Medicare Important Message given?   (If response is "NO", the following Medicare IM given date fields will be blank) Date Medicare IM given:   Date Additional Medicare IM given:    Discharge Disposition:    Per UR Regulation:  Reviewed for med. necessity/level of care/duration of stay  If discussed at Long Length of Stay Meetings, dates discussed:    Comments:

## 2012-10-07 NOTE — Progress Notes (Signed)
62yo male to add cefepime to ABX regimen for HCAP vs aspiration.  In ARF.  Will start cefepime 1g IV Q24H for current renal function and monitor for dose adjustments.  Also to continue digoxin for CHF.  Digoxin level 0.7 therapeutic for CHF (goal 0.7-0.9); will decrease dose to 0.0625mg  (takes 0.125mg  PTA) daily for ARF and monitor; recommend resuming home dose when ARF resolves.   Vernard Gambles, PharmD, BCPS 10/07/2012 3:41 AM

## 2012-10-07 NOTE — Consult Note (Signed)
Reason for Consult:abdominal distension Referring Physician: Dr. Dorina Hoyer Caleb Bauer is an 62 y.o. male.  HPI: The pt is a 62 yo bm who was admitted yesterday with a 4 week history of nausea and vomiting. He states he has not had a bm or gas during those 4 weeks but the nurse says he had a bm today. He denies any abdominal pain. He denies any medical problems although he has signicant comorbid conditions. He is being treated with abx for a pneumonia  Past Medical History  Diagnosis Date  . Diabetes mellitus without complication   . CHF (congestive heart failure)   . Hypertension   . Cardiomyopathy, dilated     History reviewed. No pertinent past surgical history.  No family history on file.  Social History:  reports that he quit smoking 9 days ago. His smoking use included Cigarettes. He has a .125 pack-year smoking history. He does not have any smokeless tobacco history on file. He reports that he does not drink alcohol or use illicit drugs.  Allergies:  Allergies  Allergen Reactions  . Other Nausea And Vomiting    Patient stated drug is "kerein"    Medications: I have reviewed the patient's current medications.  Results for orders placed during the hospital encounter of 10/06/12 (from the past 48 hour(s))  DIGOXIN LEVEL     Status: Abnormal   Collection Time    10/06/12 11:05 PM      Result Value Range   Digoxin Level 0.7 (*) 0.8 - 2.0 ng/mL  PRO B NATRIURETIC PEPTIDE     Status: Abnormal   Collection Time    10/06/12 11:05 PM      Result Value Range   Pro B Natriuretic peptide (BNP) 33980.0 (*) 0 - 125 pg/mL  COMPREHENSIVE METABOLIC PANEL     Status: Abnormal   Collection Time    10/06/12 11:05 PM      Result Value Range   Sodium 139  135 - 145 mEq/L   Potassium 5.3 (*) 3.5 - 5.1 mEq/L   Comment: HEMOLYSIS AT THIS LEVEL MAY AFFECT RESULT   Chloride 94 (*) 96 - 112 mEq/L   CO2 25  19 - 32 mEq/L   Glucose, Bld 123 (*) 70 - 99 mg/dL   BUN 84 (*) 6 - 23 mg/dL    Creatinine, Ser 1.61 (*) 0.50 - 1.35 mg/dL   Calcium 9.5  8.4 - 09.6 mg/dL   Total Protein 8.4 (*) 6.0 - 8.3 g/dL   Albumin 2.3 (*) 3.5 - 5.2 g/dL   AST 22  0 - 37 U/L   ALT 20  0 - 53 U/L   Alkaline Phosphatase 107  39 - 117 U/L   Total Bilirubin 1.6 (*) 0.3 - 1.2 mg/dL   GFR calc non Af Amer 14 (*) >90 mL/min   GFR calc Af Amer 17 (*) >90 mL/min   Comment:            The eGFR has been calculated     using the CKD EPI equation.     This calculation has not been     validated in all clinical     situations.     eGFR's persistently     <90 mL/min signify     possible Chronic Kidney Disease.  CBC WITH DIFFERENTIAL     Status: Abnormal   Collection Time    10/06/12 11:05 PM      Result Value Range   WBC 8.3  4.0 - 10.5 K/uL   RBC 6.65 (*) 4.22 - 5.81 MIL/uL   Hemoglobin 17.2 (*) 13.0 - 17.0 g/dL   HCT 16.1  09.6 - 04.5 %   MCV 76.1 (*) 78.0 - 100.0 fL   MCH 25.9 (*) 26.0 - 34.0 pg   MCHC 34.0  30.0 - 36.0 g/dL   RDW 40.9 (*) 81.1 - 91.4 %   Platelets 129 (*) 150 - 400 K/uL   Neutrophils Relative % 64  43 - 77 %   Neutro Abs 5.4  1.7 - 7.7 K/uL   Lymphocytes Relative 25  12 - 46 %   Lymphs Abs 2.1  0.7 - 4.0 K/uL   Monocytes Relative 10  3 - 12 %   Monocytes Absolute 0.8  0.1 - 1.0 K/uL   Eosinophils Relative 0  0 - 5 %   Eosinophils Absolute 0.0  0.0 - 0.7 K/uL   Basophils Relative 1  0 - 1 %   Basophils Absolute 0.0  0.0 - 0.1 K/uL  SAMPLE TO BLOOD BANK     Status: None   Collection Time    10/06/12 11:05 PM      Result Value Range   Blood Bank Specimen SAMPLE AVAILABLE FOR TESTING     Sample Expiration 10/07/2012    POCT I-STAT TROPONIN I     Status: None   Collection Time    10/06/12 11:19 PM      Result Value Range   Troponin i, poc 0.04  0.00 - 0.08 ng/mL   Comment 3            Comment: Due to the release kinetics of cTnI,     a negative result within the first hours     of the onset of symptoms does not rule out     myocardial infarction with certainty.      If myocardial infarction is still suspected,     repeat the test at appropriate intervals.  POCT GASTRIC OCCULT BLOOD (1-CARD TO LAB)     Status: Abnormal   Collection Time    10/06/12 11:21 PM      Result Value Range   Occult Blood, Gastric POSITIVE (*) NEGATIVE  CG4 I-STAT (LACTIC ACID)     Status: Abnormal   Collection Time    10/07/12 12:37 AM      Result Value Range   Lactic Acid, Venous 2.94 (*) 0.5 - 2.2 mmol/L  MRSA PCR SCREENING     Status: None   Collection Time    10/07/12  2:09 AM      Result Value Range   MRSA by PCR NEGATIVE  NEGATIVE   Comment:            The GeneXpert MRSA Assay (FDA     approved for NASAL specimens     only), is one component of a     comprehensive MRSA colonization     surveillance program. It is not     intended to diagnose MRSA     infection nor to guide or     monitor treatment for     MRSA infections.  GLUCOSE, CAPILLARY     Status: Abnormal   Collection Time    10/07/12  5:56 AM      Result Value Range   Glucose-Capillary 137 (*) 70 - 99 mg/dL  CULTURE, EXPECTORATED SPUTUM-ASSESSMENT     Status: None   Collection Time    10/07/12  8:45 AM  Result Value Range   Specimen Description SPUTUM     Special Requests NONE     Sputum evaluation       Value: MICROSCOPIC FINDINGS SUGGEST THAT THIS SPECIMEN IS NOT REPRESENTATIVE OF LOWER RESPIRATORY SECRETIONS. PLEASE RECOLLECT.     Gram Stain Report Called to,Read Back By and Verified With: Corine Shelter RN 10:15 10/07/12 ([email or web address])   Report Status 10/07/2012 FINAL    RENAL FUNCTION PANEL     Status: Abnormal   Collection Time    10/07/12  9:30 AM      Result Value Range   Sodium 139  135 - 145 mEq/L   Potassium 4.4  3.5 - 5.1 mEq/L   Chloride 98  96 - 112 mEq/L   CO2 23  19 - 32 mEq/L   Glucose, Bld 115 (*) 70 - 99 mg/dL   BUN 88 (*) 6 - 23 mg/dL   Creatinine, Ser 1.61 (*) 0.50 - 1.35 mg/dL   Calcium 8.9  8.4 - 09.6 mg/dL   Phosphorus 6.6 (*) 2.3 - 4.6 mg/dL   Albumin 1.9 (*) 3.5 - 5.2  g/dL   GFR calc non Af Amer 14 (*) >90 mL/min   GFR calc Af Amer 17 (*) >90 mL/min   Comment:            The eGFR has been calculated     using the CKD EPI equation.     This calculation has not been     validated in all clinical     situations.     eGFR's persistently     <90 mL/min signify     possible Chronic Kidney Disease.  HIV ANTIBODY (ROUTINE TESTING)     Status: None   Collection Time    10/07/12  9:34 AM      Result Value Range   HIV NON REACTIVE  NON REACTIVE   Comment: Performed at [facility]  BASIC METABOLIC PANEL     Status: Abnormal   Collection Time    10/07/12  9:34 AM      Result Value Range   Sodium 140  135 - 145 mEq/L   Potassium 5.0  3.5 - 5.1 mEq/L   Comment: HEMOLYZED SPECIMEN, RESULTS MAY BE AFFECTED   Chloride 96  96 - 112 mEq/L   CO2 25  19 - 32 mEq/L   Glucose, Bld 117 (*) 70 - 99 mg/dL   BUN 90 (*) 6 - 23 mg/dL   Creatinine, Ser 0.45 (*) 0.50 - 1.35 mg/dL   Calcium 8.9  8.4 - 40.9 mg/dL   GFR calc non Af Amer 15 (*) >90 mL/min   GFR calc Af Amer 17 (*) >90 mL/min   Comment:            The eGFR has been calculated     using the CKD EPI equation.     This calculation has not been     validated in all clinical     situations.     eGFR's persistently     <90 mL/min signify     possible Chronic Kidney Disease.  CBC     Status: Abnormal   Collection Time    10/07/12  9:34 AM      Result Value Range   WBC 8.4  4.0 - 10.5 K/uL   RBC 6.16 (*) 4.22 - 5.81 MIL/uL   Hemoglobin 16.0  13.0 - 17.0 g/dL   HCT 81.1  91.4 - 78.2 %  MCV 76.1 (*) 78.0 - 100.0 fL   MCH 26.0  26.0 - 34.0 pg   MCHC 34.1  30.0 - 36.0 g/dL   RDW 16.1 (*) 09.6 - 04.5 %   Platelets 122 (*) 150 - 400 K/uL  MAGNESIUM     Status: None   Collection Time    10/07/12  9:34 AM      Result Value Range   Magnesium 2.0  1.5 - 2.5 mg/dL  PROCALCITONIN     Status: None   Collection Time    10/07/12  9:34 AM      Result Value Range   Procalcitonin 4.19     Comment:             Interpretation:     PCT > 2 ng/mL:     Systemic infection (sepsis) is likely,     unless other causes are known.     (NOTE)             ICU PCT Algorithm               Non ICU PCT Algorithm        ----------------------------     ------------------------------             PCT < 0.25 ng/mL                 PCT < 0.1 ng/mL         Stopping of antibiotics            Stopping of antibiotics           strongly encouraged.               strongly encouraged.        ----------------------------     ------------------------------           PCT level decrease by               PCT < 0.25 ng/mL           >= 80% from peak PCT           OR PCT 0.25 - 0.5 ng/mL          Stopping of antibiotics                                                 encouraged.         Stopping of antibiotics               encouraged.        ----------------------------     ------------------------------           PCT level decrease by              PCT >= 0.25 ng/mL           < 80% from peak PCT            AND PCT >= 0.5 ng/mL            Continuing antibiotics                                                  encouraged.  Continuing antibiotics                encouraged.        ----------------------------     ------------------------------         PCT level increase compared          PCT > 0.5 ng/mL             with peak PCT AND              PCT >= 0.5 ng/mL             Escalation of antibiotics                                              strongly encouraged.          Escalation of antibiotics            strongly encouraged.  CULTURE, EXPECTORATED SPUTUM-ASSESSMENT     Status: None   Collection Time    10/07/12 11:44 AM      Result Value Range   Specimen Description SPUTUM     Special Requests Normal     Sputum evaluation       Value: THIS SPECIMEN IS ACCEPTABLE. RESPIRATORY CULTURE REPORT TO FOLLOW.   Report Status 10/07/2012 FINAL    GLUCOSE, CAPILLARY     Status: Abnormal   Collection Time     10/07/12 12:51 PM      Result Value Range   Glucose-Capillary 112 (*) 70 - 99 mg/dL    US Renal  4/0/9811   *RADIOLOGY REPORT*  Clinical Data: Acute renal failure, diabetes, hypertension  RENAL/URINARY TRACT ULTRASOUND COMPLETE  Comparison:  Ultrasound the abdomen of 09/17/2012  Findings:  Right Kidney:  No hydronephrosis is seen.  Bowel gas and patient body habitus obscures evaluation.  The right kidney measures 13.1 cm sagittally.  Left Kidney:  No hydronephrosis is seen.  The left kidney measures 12.9 cm.  Two cysts are present of no larger than 1.7 cm.  The left renal parenchyma is questionably echogenic, difficult to assess due to body habitus.  Bladder:  The urinary bladder is not distended and cannot be well evaluated.  IMPRESSION:  1.  No hydronephrosis. 2.  Question slightly echogenic left renal parenchyma. 3.  The urinary bladder is decompressed and cannot be evaluated.   Original Report Authenticated By: Dwyane Dee, M.D.   Dg Chest Portable 1 View  10/06/2012   *RADIOLOGY REPORT*  Clinical Data: Emesis  PORTABLE CHEST - 1 VIEW  Comparison: 09/19/2012  Findings: Cardiomegaly evident with vascular congestion.  Dense consolidation in the lingula and left lower lobe compatible with pneumonia.  Associated left effusion not excluded.  No pneumothorax.  Exam is rotated to the left.  IMPRESSION: Dense lingula and left lower lobe consolidation/collapse. Associated left effusion suspected.  Cardiomegaly with vascular congestion   Original Report Authenticated By: Judie Petit. Miles Costain, M.D.   Dg Abd Portable 2v  10/07/2012   *RADIOLOGY REPORT*  Clinical Data: Distended abdomen, vomiting.  PORTABLE ABDOMEN - 2 VIEW  Comparison: None.  Findings: Dilated stomach and bowel loops throughout the abdomen and pelvis.  This appears to most likely be predominately small bowel loops concerning for small bowel obstruction.  No free air. No organomegaly or suspicious calcification.  IMPRESSION: Dilated bowel loops in the abdomen,  concerning for small bowel dilatation and  small bowel obstruction.   Original Report Authenticated By: Charlett Nose, M.D.    Review of Systems  HENT: Negative.   Eyes: Negative.   Respiratory: Positive for cough.   Cardiovascular: Negative.   Gastrointestinal: Positive for nausea and vomiting.  Genitourinary: Negative.   Musculoskeletal: Negative.   Skin: Negative.   Neurological: Positive for weakness.  Endo/Heme/Allergies: Negative.   Psychiatric/Behavioral: Negative.    Blood pressure 101/60, pulse 97, temperature 98.6 F (37 C), temperature source Oral, resp. rate 21, height 6' (1.829 m), weight 217 lb 2.5 oz (98.5 kg), SpO2 94.00%. Physical Exam  Constitutional: He is oriented to person, place, and time. He appears well-developed and well-nourished.  HENT:  Head: Normocephalic and atraumatic.  Eyes: Conjunctivae and EOM are normal. Pupils are equal, round, and reactive to light.  Neck: Normal range of motion. Neck supple.  Cardiovascular: Normal rate, regular rhythm and normal heart sounds.   Respiratory: Effort normal and breath sounds normal.  GI: Soft.  The abdomen is distended but soft and he denies any tenderness. It is quiet with no bs. No surgical scars  Musculoskeletal: Normal range of motion.  Neurological: He is alert and oriented to person, place, and time.  Skin: Skin is warm and dry.  Psychiatric:  Pt would not open eyes but would answer questions    Assessment/Plan: The pt appears to have an sbo vs ileus. I would favor ileus since he has no abdominal surgical history. If he is vomiting then I would recommend an NG tube and bowel rest. He will need continued resuscitation. He may benefit from a CT but with his renal failure the exam would have to be without IV contrast although he could have oral down the ng. We will follow  TOTH III,Jayah Balthazar S 10/07/2012, 5:43 PM

## 2012-10-07 NOTE — Progress Notes (Signed)
Clinical Social Work Department BRIEF PSYCHOSOCIAL ASSESSMENT 10/07/2012  Patient:  Caleb Bauer, Caleb Bauer     Account Number:  [id number]     Admit date:  10/06/2012  Clinical Social Worker:  Hendricks Milo  Date/Time:  10/07/2012 01:39 PM  Referred by:  RN  Date Referred:  10/07/2012 Referred for  ALF Placement   Other Referral:   SNF   Interview type:  Patient Other interview type:   Rowan Blase Corns niece [phone]    PSYCHOSOCIAL DATA Living Status:  FACILITY Admitted from facility:  OTHER Level of care:  Skilled Nursing Facility Primary support name:  Lawanda Corns Primary support relationship to patient:  FAMILY Degree of support available:   Niece reported to CSW that the patient had been staying at [facility] in [place].    CURRENT CONCERNS Current Concerns  Post-Acute Placement   Other Concerns:    SOCIAL WORK ASSESSMENT / PLAN CSW met with patient about SNF placement. Patient reported that he was going home to live with his niece [name]. Patient reported that he had never stayed in a SNF. CSW contacted [employer] (niece of patient) and she reported that she does not want the patient to come home with her and that she working on getting him placed at [facility]. Niece reported that she was meeting with a nurse from [facility] today about available beds. Niece reported that [facility] has an available bed for the patient. CSW faxed clinicals to [facility]. CSW will complete FL2 for MD signature and will update patient and niece when bed offer is made.   Assessment/plan status:  Psychosocial Support/Ongoing Assessment of Needs Other assessment/ plan:   Information/referral to community resources:   Patient from facility per niece.    PATIENT'S/FAMILY'S RESPONSE TO PLAN OF CARE: Niece reported that [facility] is her first and only choice at this time due to the poor care received at previous facility.

## 2012-10-07 NOTE — Progress Notes (Signed)
ANTIBIOTIC CONSULT NOTE - INITIAL  Pharmacy Consult for vancomycin Indication: rule out pneumonia  Allergies  Allergen Reactions  . Other Nausea And Vomiting    Patient stated drug is "kerein"    Patient Measurements:   Body Weight: 114.6 kg  Vital Signs: Temp: 97.9 F (36.6 C) (08/06 2240) Temp src: Oral (08/06 2240) BP: 81/64 mmHg (08/06 2315) Pulse Rate: 90 (08/06 2315) Intake/Output from previous day:   Intake/Output from this shift:    Labs:  Recent Labs  10/06/12 2305  WBC 8.3  HGB 17.2*  PLT 129*  CREATININE 4.13*   The CrCl is unknown because both a height and weight (above a minimum accepted value) are required for this calculation. No results found for this basename: VANCOTROUGH, Leodis Binet, VANCORANDOM, GENTTROUGH, GENTPEAK, GENTRANDOM, TOBRATROUGH, TOBRAPEAK, TOBRARND, AMIKACINPEAK, AMIKACINTROU, AMIKACIN,  in the last 72 hours   Microbiology: Recent Results (from the past 720 hour(s))  CLOSTRIDIUM DIFFICILE BY PCR     Status: None   Collection Time    09/11/12 11:13 AM      Result Value Range Status   C difficile by pcr NEGATIVE  NEGATIVE Final  CULTURE, BLOOD (ROUTINE X 2)     Status: None   Collection Time    09/11/12  8:20 PM      Result Value Range Status   Specimen Description BLOOD RIGHT ARM   Final   Special Requests BOTTLES DRAWN AEROBIC AND ANAEROBIC 10CC EA   Final   Culture  Setup Time 09/12/2012 03:24   Final   Culture NO GROWTH 5 DAYS   Final   Report Status 09/18/2012 FINAL   Final  CULTURE, BLOOD (ROUTINE X 2)     Status: None   Collection Time    09/11/12  8:30 PM      Result Value Range Status   Specimen Description BLOOD LEFT HAND   Final   Special Requests BOTTLES DRAWN AEROBIC ONLY 10CC   Final   Culture  Setup Time 09/12/2012 03:24   Final   Culture NO GROWTH 5 DAYS   Final   Report Status 09/18/2012 FINAL   Final  URINE CULTURE     Status: None   Collection Time    09/12/12  6:15 AM      Result Value Range Status   Specimen Description URINE, CATHETERIZED   Final   Special Requests NONE   Final   Culture  Setup Time 09/12/2012 06:44   Final   Colony Count NO GROWTH   Final   Culture NO GROWTH   Final   Report Status 09/13/2012 FINAL   Final    Medical History: Past Medical History  Diagnosis Date  . Diabetes mellitus without complication   . CHF (congestive heart failure)   . Hypertension   . Cardiomyopathy, dilated     Medications:   (Not in a hospital admission) Assessment: 62 yo man to start vancomycin for 8 days for PNA.  His SrCr is 4.13 and WBC 8.3.  He has had nausea and vomiting.  Goal of Therapy:  Vancomycin trough level 15-20 mcg/ml  Plan:  Vancomycin 1750 mg IV x 1. Will f/u renal function to determine maintenance dosing.  Makeyla Govan Poteet 10/07/2012,12:13 AM

## 2012-10-07 NOTE — ED Notes (Signed)
Attempted 2nd IV, unsuccessful, BC and lactic acid obtained.

## 2012-10-07 NOTE — Progress Notes (Signed)
Pt had 24 bit run of vtach/asymptomatic [facility] [employer].

## 2012-10-07 NOTE — Progress Notes (Signed)
Clinical Social Work Department CLINICAL SOCIAL WORK PLACEMENT NOTE 10/07/2012  Patient:  [name], [name]  Account Number:  [id number] Admit date:  10/06/2012  Clinical Social Worker:  Jetta Lout, Theresia Majors  Date/time:  10/07/2012 01:55 PM  Clinical Social Work is seeking post-discharge placement for this patient at the following level of care:   SKILLED NURSING   (*CSW will update this form in Epic as items are completed)   10/07/2012  Patient/family provided with [facility] Department of Clinical Social Work's list of facilities offering this level of care within the geographic area requested by the patient (or if unable, by the patient's family).  10/07/2012  Patient/family informed of their freedom to choose among providers that offer the needed level of care, that participate in Medicare, Medicaid or managed care program needed by the patient, have an available bed and are willing to accept the patient.  10/07/2012  Patient/family informed of [facility]' ownership interest in [facility], as well as of the fact that they are under no obligation to receive care at this facility.  PASARR submitted to EDS on 10/07/2012 PASARR number received from [employer] on 10/07/2012  FL2 transmitted to all facilities in geographic area requested by pt/family on  10/07/2012 FL2 transmitted to all facilities within larger geographic area on   Patient informed that his/her managed care company has contracts with or will negotiate with  certain facilities, including the following:     Patient/family informed of bed offers received:   Patient chooses bed at  Physician recommends and patient chooses bed at    Patient to be transferred to  on   Patient to be transferred to facility by   The following physician request were entered in Epic:   Additional Comments:

## 2012-10-07 NOTE — Progress Notes (Signed)
[facility] Progress Note [place] TEAM 1 - Stepdown/ICU TEAM   KAYLUM SHRUM WUJ:[id number] DOB: Nov 19, 1950 DOA: 10/06/2012 PCP: Pcp Not In System  Brief narrative: Caleb Bauer is a 62 y.o. Male with severe systolic CHF, DM,  V-tach who was admitted for Cardiogenic shock on 7/11 and discharge on 7/28 after adequate diuresis. He went to a nursing home  He presents to the ED with c/o N/V for the past 4 weeks. He reports he vomits every day, usually before eating, after vomiting he feels better and is able to eat. In the ER he was found to have pneumonia and ARF.  NOTE: - pt was 114 Kg on d/c 7/28 and is 98.5 kg now.   Assessment/Plan: Principal Problem: Small bowel obstruction/ vomiting for 4 wks.  - noted on XRAY - place NG tube- switch meds to IV - called surgery     HCAP (healthcare-associated pneumonia) - cont current antibiotics- CXR suggests partial collapse of left lung - 94% on 4 L - will need to ask for pulm consult in AM  ARF with decreased urine output Due to being on diuretics and vomiting on an off for 3 wks.  - very poor urine output- currently on 125 cc NS - will bolus 500 cc now  - foley cath- stat UA    Diabetes mellitus without complication - controlled without outpt meds    Hypotension, unspecified - only mildly elevated lactic acid  - Bp now improved  Chronic systolic CHF- EF 95-62% - hydrating as mentioned due to dehydration -d/c dig due to ARF  H/o V-tach - cardio stated in the past not a candidate for AICD- they placed him on Amio - unfortunately cannot give to him now due to obstruction - will give 150 mg now once with should last for few days (d/w cardio)  Question of Apical thrombus last admission - per cards, not candidate for anticoagulation    Code Status: full code Family Communication: none Disposition Plan: follow in SDU  Consultants: none  Procedures: none  Antibiotics: Vanc/Cefepime  DVT  prophylaxis: SCDs  HPI/Subjective: Tells me he has been vomiting for 4 weeks.  Vomiting up bile. No cough or dyspnea.    Objective: Blood pressure 101/60, pulse 97, temperature 98.6 F (37 C), temperature source Oral, resp. rate 21, height 6' (1.829 m), weight 98.5 kg (217 lb 2.5 oz), SpO2 94.00%.  Intake/Output Summary (Last 24 hours) at 10/07/12 1721 Last data filed at 10/07/12 1700  Gross per 24 hour  Intake   2250 ml  Output    430 ml  Net   1820 ml     Exam: General: AAO x 3 ,No acute respiratory distress Lungs: unabvle to listen to adequately as he cannot sit up- decreased breath sound in  Cardiovascular: Regular rate and rhythm without murmur gallop or rub normal S1 and S2 Abdomen: Nontender, nondistended, soft, bowel sounds positive, no rebound, no ascites, no appreciable mass Extremities: No significant cyanosis, clubbing, or edema bilateral lower extremities  Data Reviewed: Basic Metabolic Panel:  Recent Labs Lab 10/06/12 2305 10/07/12 0930 10/07/12 0934  NA 139 139 140  K 5.3* 4.4 5.0  CL 94* 98 96  CO2 25 23 25   GLUCOSE 123* 115* 117*  BUN 84* 88* 90*  CREATININE 4.13* 4.13* 4.06*  CALCIUM 9.5 8.9 8.9  MG  --   --  2.0  PHOS  --  6.6*  --    Liver Function Tests:  Recent Labs  Lab 10/06/12 2305 10/07/12 0930  AST 22  --   ALT 20  --   ALKPHOS 107  --   BILITOT 1.6*  --   PROT 8.4*  --   ALBUMIN 2.3* 1.9*   No results found for this basename: LIPASE, AMYLASE,  in the last 168 hours No results found for this basename: AMMONIA,  in the last 168 hours CBC:  Recent Labs Lab 10/06/12 2305 10/07/12 0934  WBC 8.3 8.4  NEUTROABS 5.4  --   HGB 17.2* 16.0  HCT 50.6 46.9  MCV 76.1* 76.1*  PLT 129* 122*   Cardiac Enzymes: No results found for this basename: CKTOTAL, CKMB, CKMBINDEX, TROPONINI,  in the last 168 hours BNP (last 3 results)  Recent Labs  09/25/12 0450 09/26/12 0530 10/06/12 2305  PROBNP 16109.6* 34516.0* 33980.0*    CBG:  Recent Labs Lab 10/07/12 0556 10/07/12 1251  GLUCAP 137* 112*    Recent Results (from the past 240 hour(s))  MRSA PCR SCREENING     Status: None   Collection Time    10/07/12  2:09 AM      Result Value Range Status   MRSA by PCR NEGATIVE  NEGATIVE Final   Comment:            The GeneXpert MRSA Assay (FDA     approved for NASAL specimens     only), is one component of a     comprehensive MRSA colonization     surveillance program. It is not     intended to diagnose MRSA     infection nor to guide or     monitor treatment for     MRSA infections.  CULTURE, EXPECTORATED SPUTUM-ASSESSMENT     Status: None   Collection Time    10/07/12  8:45 AM      Result Value Range Status   Specimen Description SPUTUM   Final   Special Requests NONE   Final   Sputum evaluation     Final   Value: MICROSCOPIC FINDINGS SUGGEST THAT THIS SPECIMEN IS NOT REPRESENTATIVE OF LOWER RESPIRATORY SECRETIONS. PLEASE RECOLLECT.     Gram Stain Report Called to,Read Back By and Verified With: Corine Shelter RN 10:15 10/07/12 ([email or web address])   Report Status 10/07/2012 FINAL   Final  CULTURE, EXPECTORATED SPUTUM-ASSESSMENT     Status: None   Collection Time    10/07/12 11:44 AM      Result Value Range Status   Specimen Description SPUTUM   Final   Special Requests Normal   Final   Sputum evaluation     Final   Value: THIS SPECIMEN IS ACCEPTABLE. RESPIRATORY CULTURE REPORT TO FOLLOW.   Report Status 10/07/2012 FINAL   Final     Studies:  Recent x-ray studies have been reviewed in detail by the Attending Physician  Scheduled Meds:  Scheduled Meds: . ipratropium  0.5 mg Nebulization QID   And  . albuterol  2.5 mg Nebulization QID  . amiodarone  200 mg Oral Daily  . ceFEPime (MAXIPIME) IV  1 g Intravenous Q24H  . digoxin  0.0625 mg Oral Daily  . metoCLOPramide  5 mg Oral TID AC & HS  . pantoprazole  80 mg Oral Daily  . predniSONE  5 mg Oral Q breakfast   Followed by  . [START ON 10/10/2012]  predniSONE  2.5 mg Oral Q breakfast  . [START ON 10/08/2012] vancomycin  1,000 mg Intravenous Q24H   Continuous Infusions: . sodium chloride 125 mL/hr  at 10/07/12 1700    Time spent on care of this patient: 35 min   Calvert Cantor , MD  [facility] Office  [phone] Pager - Text Page per Amion as per below:  On-Call/Text Page:      [email or web address]      password [email or web address]  If 7PM-7AM, please contact night-coverage [email or web address] Password [email or web address] 10/07/2012, 5:21 PM   LOS: 1 day

## 2012-10-07 NOTE — H&P (Addendum)
[facility] History and Physical  Caleb Bauer:[id number] DOB: Oct 16, 1950 DOA: 10/06/2012  Referring physician: ED PCP: Pcp Not In System  Chief Complaint: emesis  HPI: Caleb Bauer is a 62 y.o. male who presents to the ED with c/o N/V for the past 4 weeks.  He reports he vomits every day, usually before eating, after vomiting he feels better and is able to eat.  Patient seems like a poor historian.  Per patients niece, he has not been receiving good care at the SNF he has been in since his DC from [facility] on 7/28 and so she brought the patient in.  She is worried that he is developing infection in his leg again.  In the ED the niece's worries that the patients health may be deteriorating since his discharge are indeed substantiated: he has an HCAP vs aspiration PNA, is slightly hypotensive, there is trace blood in his vomit (although thankfully HGB is 17.2 so not at all anemic), he is severely dehydrated, in acute kidney failure and slightly hyperkalemic.  Hospitalist is admitting patient.  Review of Systems: 12 systems reviewed and otherwise negative.  Past Medical History  Diagnosis Date  . Diabetes mellitus without complication   . CHF (congestive heart failure)   . Hypertension   . Cardiomyopathy, dilated    History reviewed. No pertinent past surgical history. Social History:  reports that he quit smoking 9 days ago. His smoking use included Cigarettes. He has a .125 pack-year smoking history. He does not have any smokeless tobacco history on file. He reports that he does not drink alcohol or use illicit drugs.   Allergies  Allergen Reactions  . Other Nausea And Vomiting    Patient stated drug is "kerein"    No family history on file.History reviewed, no pertinent family history.  Prior to Admission medications   Medication Sig Start Date End Date Taking? Authorizing Provider  ALPRAZolam (XANAX) 0.25 MG tablet Take 0.25 mg by mouth 3 (three) times daily as needed for  anxiety.   Yes Historical Provider, MD  amiodarone (PACERONE) 200 MG tablet Take 1 tablet (200 mg total) by mouth daily. 09/24/12  Yes Richarda Overlie, MD  aspirin EC 81 MG tablet Take 81 mg by mouth daily.   Yes Historical Provider, MD  cholecalciferol (VITAMIN D) 1000 UNITS tablet Take 1,000 Units by mouth daily.   Yes Historical Provider, MD  ciprofloxacin (CIPRO) 250 MG tablet Take 250 mg by mouth 2 (two) times daily. For 2 weeks, Start 8.5.14 end 8.18.14   Yes Historical Provider, MD  digoxin (LANOXIN) 0.125 MG tablet Take 1 tablet (0.125 mg total) by mouth daily. 09/24/12  Yes Richarda Overlie, MD  ipratropium-albuterol (DUONEB) 0.5-2.5 (3) MG/3ML SOLN Take 3 mLs by nebulization 4 (four) times daily.   Yes Historical Provider, MD  ipratropium-albuterol (DUONEB) 0.5-2.5 (3) MG/3ML SOLN Take 3 mLs by nebulization every 3 (three) hours as needed (for wheezing /shortness of breath).   Yes Historical Provider, MD  isosorbide-hydrALAZINE (BIDIL) 20-37.5 MG per tablet Take 1 tablet by mouth 3 (three) times daily. 09/24/12  Yes Richarda Overlie, MD  metoCLOPramide (REGLAN) 5 MG tablet Take 5 mg by mouth 4 (four) times daily -  before meals and at bedtime. For 1 week. Start 8.4.14 ending 8.10.14   Yes Historical Provider, MD  metoprolol tartrate (LOPRESSOR) 12.5 mg TABS Take 0.5 tablets (12.5 mg total) by mouth 2 (two) times daily. 09/24/12  Yes Richarda Overlie, MD  Multiple Vitamin (MULTIVITAMIN WITH MINERALS)  TABS tablet Take 1 tablet by mouth daily.   Yes Historical Provider, MD  omeprazole (PRILOSEC) 40 MG capsule Take 40 mg by mouth daily.   Yes Historical Provider, MD  potassium chloride SA (K-DUR,KLOR-CON) 20 MEQ tablet Take 40 mEq by mouth daily.   Yes Historical Provider, MD  predniSONE (DELTASONE) 5 MG tablet Take 2.5-15 mg by mouth daily. 15mg  for 4 days, then 10 mg for 4 days, then 5 mg for 4 days, then 2.5 mg for 4 days, then stop   Yes Historical Provider, MD  promethazine (PHENERGAN) 25 MG/ML injection  Inject 25 mg into the vein every 6 (six) hours as needed for nausea or vomiting. For 3 days start 8.6.14 end 8.8.14   Yes Historical Provider, MD  torsemide (DEMADEX) 20 MG tablet Take 2 tablets (40 mg total) by mouth 2 (two) times daily. 09/27/12  Yes Richarda Overlie, MD   Physical Exam: Filed Vitals:   10/07/12 0300  BP: 110/72  Pulse: 88  Temp:   Resp: 22    General:  NAD, resting comfortably in bed Eyes: PEERLA EOMI ENT: mucous membranes moist Neck: supple w/o JVD Cardiovascular: RRR w/o MRG Respiratory: CTA B Abdomen: soft, nt, nd, bs+ Skin: no rash nor lesion Musculoskeletal: MAE, full ROM all 4 extremities Psychiatric: normal tone and affect Neurologic: AAOx3, grossly non-focal  Labs on Admission:  Basic Metabolic Panel:  Recent Labs Lab 10/06/12 2305  NA 139  K 5.3*  CL 94*  CO2 25  GLUCOSE 123*  BUN 84*  CREATININE 4.13*  CALCIUM 9.5   Liver Function Tests:  Recent Labs Lab 10/06/12 2305  AST 22  ALT 20  ALKPHOS 107  BILITOT 1.6*  PROT 8.4*  ALBUMIN 2.3*   No results found for this basename: LIPASE, AMYLASE,  in the last 168 hours No results found for this basename: AMMONIA,  in the last 168 hours CBC:  Recent Labs Lab 10/06/12 2305  WBC 8.3  NEUTROABS 5.4  HGB 17.2*  HCT 50.6  MCV 76.1*  PLT 129*   Cardiac Enzymes: No results found for this basename: CKTOTAL, CKMB, CKMBINDEX, TROPONINI,  in the last 168 hours  BNP (last 3 results)  Recent Labs  09/25/12 0450 09/26/12 0530 10/06/12 2305  PROBNP 26983.0* 34516.0* 33980.0*   CBG: No results found for this basename: GLUCAP,  in the last 168 hours  Radiological Exams on Admission: Dg Chest Portable 1 View  10/06/2012   *RADIOLOGY REPORT*  Clinical Data: Emesis  PORTABLE CHEST - 1 VIEW  Comparison: 09/19/2012  Findings: Cardiomegaly evident with vascular congestion.  Dense consolidation in the lingula and left lower lobe compatible with pneumonia.  Associated left effusion not excluded.   No pneumothorax.  Exam is rotated to the left.  IMPRESSION: Dense lingula and left lower lobe consolidation/collapse. Associated left effusion suspected.  Cardiomegaly with vascular congestion   Original Report Authenticated By: Judie Petit. Miles Costain, M.D.    EKG: Independently reviewed.  Assessment/Plan Principal Problem:   HCAP (healthcare-associated pneumonia) Active Problems:   Diabetes mellitus without complication   Hypotension, unspecified   Nausea and vomiting   Acute kidney failure   1. HCAP - vs aspiration PNA / pneumonitis: patient being treated with cefepime and vancomycin. 2. Hypotension - possibly due to HCAP vs severe dehydration, IVF at 125 cc/hr, got bolus in ED, holding all BP meds. 3. AKF - holding nephrotoxic ACEI and diuretics and rehydrating, likely pre-renal, renal US ordered. 4. N/V - zofran and reglan PRN. 5. DM -  dont see that he is on any home meds for this so have ordered just CBG checks Q6H at this time. 6. Heme positive emesis - trace amount of blood, likely from persistent vomiting, does not appear to be major bleed at this point but holding heparin DVT ppx and using SCDs instead.    Code Status: Full Code (must indicate code status--if unknown or must be presumed, indicate so) Family Communication: No family in room (indicate person spoken with, if applicable, with phone number if by telephone) Disposition Plan: Admit to inpatient (indicate anticipated LOS)  Time spent: 70 min  Carden Teel M. [facility] Pager [phone]  If 7PM-7AM, please contact night-coverage [email or web address] Password [facility] 10/07/2012, 3:33 AM

## 2012-10-08 ENCOUNTER — Inpatient Hospital Stay (HOSPITAL_COMMUNITY): Payer: Medicare Other

## 2012-10-08 ENCOUNTER — Encounter (HOSPITAL_COMMUNITY): Payer: Self-pay | Admitting: Internal Medicine

## 2012-10-08 LAB — CBC
MCH: 26.1 pg (ref 26.0–34.0)
MCHC: 34.2 g/dL (ref 30.0–36.0)
MCV: 76.4 fL — ABNORMAL LOW (ref 78.0–100.0)
Platelets: 90 10*3/uL — ABNORMAL LOW (ref 150–400)

## 2012-10-08 LAB — COMPREHENSIVE METABOLIC PANEL
ALT: 11 U/L (ref 0–53)
Calcium: 8 mg/dL — ABNORMAL LOW (ref 8.4–10.5)
Creatinine, Ser: 3.73 mg/dL — ABNORMAL HIGH (ref 0.50–1.35)
GFR calc Af Amer: 19 mL/min — ABNORMAL LOW (ref >90)
Glucose, Bld: 138 mg/dL — ABNORMAL HIGH (ref 70–99)
Sodium: 143 mEq/L (ref 135–145)
Total Protein: 6.3 g/dL (ref 6.0–8.3)

## 2012-10-08 LAB — LEGIONELLA ANTIGEN, URINE

## 2012-10-08 MED ORDER — IOHEXOL 300 MG/ML  SOLN
25.0000 mL | INTRAMUSCULAR | Status: AC
  Administered 2012-10-08 (×2): 25 mL via ORAL

## 2012-10-08 NOTE — Progress Notes (Signed)
Pharmacist Heart Failure Core Measure Documentation  Assessment: Caleb Bauer has an EF documented as 20% - 25% on 09/13/12 by ECHO.  Rationale: Heart failure patients with left ventricular systolic dysfunction (LVSD) and an EF < 40% should be prescribed an angiotensin converting enzyme inhibitor (ACEI) or angiotensin receptor blocker (ARB) at discharge unless a contraindication is documented in the medical record.  This patient is not currently on an ACEI or ARB for HF.  This note is being placed in the record in order to provide documentation that a contraindication to the use of these agents is present for this encounter.  ACE Inhibitor or Angiotensin Receptor Blocker is contraindicated (specify all that apply)  []   ACEI allergy AND ARB allergy []   Angioedema []   Moderate or severe aortic stenosis []   Hyperkalemia []   Hypotension []   Renal artery stenosis [x]   Worsening renal function, preexisting renal disease or dysfunction   Harland German, Pharm D 10/08/2012 11:35 AM

## 2012-10-08 NOTE — Progress Notes (Signed)
[facility] Progress Note [place] TEAM 1 - Stepdown/ICU TEAM   Caleb Bauer NWG:[id number] DOB: 03/31/1950 DOA: 10/06/2012 PCP: Pcp Not In System  Brief narrative: 62 y.o. male with severe systolic CHF, DM, and V-tach who was admitted for cardiogenic shock on 7/11 and discharged to a SNF on 7/28 after extensive diuresis. He presented to the ED with c/o N/V for 4 weeks. He reported vomiting every day, usually before eating. In the ER he was found to have pneumonia and ARF.   NOTE: pt was 114 Kg on d/c 7/28 and is 98.5 kg now.  Assessment/Plan:  Partial Small bowel obstruction/ vomiting for 4 wks.  -NGT inserted with > 1000 out initally -FU abd XR/CT abd on 8/8 c/w PSBO (proximal SB) -CCS following -no indications for surgery at this time   ?? HCAP (healthcare-associated pneumonia) -not totally convinced has PNA -Initial CXR suggested partial collapse of left lung but given extensive cardiac enlargement this would be better elucidated on 2 view CXR  -CT abdomen and pelvis (8/8) revealed dependent lower lobe atelectasis with patchy opacity in the right middle and lower lobes that also could be atelectasis -he did not have any leukocytosis or fever at presentation  -hypoxia could be mechanical issues with ventilation  ARF with decreased urine output - Due to being on diuretics and vomiting on an off for 3 wks.  - very poor urine output  - foley cath to measure accurate intake and output    Diabetes mellitus without complication - controlled without outpt meds    Hypotension, unspecified - only mildly elevated lactic acid  - Bp now improved  Chronic systolic CHF- EF 57-84% - hydrating as mentioned due to dehydration - d/c'd dig due to ARF  H/o V-tach - stated in the past not a candidate for AICD - d/w cardio - consider initiating amiodarone infusion if must remain n.p.o. over a period of time  Question of Apical thrombus last admission - per cards, not candidate  for anticoagulation  Code Status: FULL Family Communication: none Disposition Plan: SDU  Consultants: Gen Surg  Procedures: none  Antibiotics: Vanc 8/6 >> Cefepime 8/6 >>  DVT prophylaxis: SCDs  HPI/Subjective: Feels much better today.  Asking when he can eat.  Denies cp, sob, nausea, abdom pain.  Objective: Blood pressure 117/65, pulse 87, temperature 98 F (36.7 C), temperature source Oral, resp. rate 23, height 6' (1.829 m), weight 98.5 kg (217 lb 2.5 oz), SpO2 95.00%.  Intake/Output Summary (Last 24 hours) at 10/08/12 1625 Last data filed at 10/08/12 1500  Gross per 24 hour  Intake   4850 ml  Output   6955 ml  Net  -2105 ml   Exam: General: AAO x 3 ,No acute respiratory distress Lungs: CTA th/o w/ no wheeze Cardiovascular: Regular rate and rhythm without murmur gallop or rub normal S1 and S2 Abdomen: no rebound - NG in place - no appreciable mass - soft - protuberent  Extremities: No significant cyanosis, clubbing, or edema bilateral lower extremities  Data Reviewed: Basic Metabolic Panel:  Recent Labs Lab 10/06/12 2305 10/07/12 0930 10/07/12 0934 10/08/12 0757  NA 139 139 140 143  K 5.3* 4.4 5.0 4.0  CL 94* 98 96 102  CO2 25 23 25 25   GLUCOSE 123* 115* 117* 138*  BUN 84* 88* 90* 94*  CREATININE 4.13* 4.13* 4.06* 3.73*  CALCIUM 9.5 8.9 8.9 8.0*  MG  --   --  2.0  --   PHOS  --  6.6*  --   --    Liver Function Tests:  Recent Labs Lab 10/06/12 2305 10/07/12 0930 10/08/12 0757  AST 22  --  12  ALT 20  --  11  ALKPHOS 107  --  103  BILITOT 1.6*  --  0.8  PROT 8.4*  --  6.3  ALBUMIN 2.3* 1.9* 1.5*   CBC:  Recent Labs Lab 10/06/12 2305 10/07/12 0934 10/08/12 0757  WBC 8.3 8.4 9.8  NEUTROABS 5.4  --   --   HGB 17.2* 16.0 13.7  HCT 50.6 46.9 40.1  MCV 76.1* 76.1* 76.4*  PLT 129* 122* 90*   BNP (last 3 results)  Recent Labs  09/25/12 0450 09/26/12 0530 10/06/12 2305  PROBNP 45409.8* 34516.0* 33980.0*   CBG:  Recent  Labs Lab 10/07/12 0556 10/07/12 1251 10/07/12 2330 10/08/12 0554 10/08/12 1156  GLUCAP 137* 112* 130* 132* 126*    Recent Results (from the past 240 hour(s))  CULTURE, BLOOD (ROUTINE X 2)     Status: None   Collection Time    10/07/12 12:20 AM      Result Value Range Status   Specimen Description BLOOD LEFT ARM IV START   Final   Special Requests BOTTLES DRAWN AEROBIC ONLY 10CC   Final   Culture  Setup Time     Final   Value: 10/07/2012 04:30     Performed at [facility]   Culture     Final   Value:        BLOOD CULTURE RECEIVED NO GROWTH TO DATE CULTURE WILL BE HELD FOR 5 DAYS BEFORE ISSUING A FINAL NEGATIVE REPORT     Performed at [facility]   Report Status PENDING   Incomplete  CULTURE, BLOOD (ROUTINE X 2)     Status: None   Collection Time    10/07/12 12:27 AM      Result Value Range Status   Specimen Description BLOOD RIGHT HAND   Final   Special Requests BOTTLES DRAWN AEROBIC ONLY 2CC   Final   Culture  Setup Time     Final   Value: 10/07/2012 04:30     Performed at [facility]   Culture     Final   Value:        BLOOD CULTURE RECEIVED NO GROWTH TO DATE CULTURE WILL BE HELD FOR 5 DAYS BEFORE ISSUING A FINAL NEGATIVE REPORT     Performed at [facility]   Report Status PENDING   Incomplete  MRSA PCR SCREENING     Status: None   Collection Time    10/07/12  2:09 AM      Result Value Range Status   MRSA by PCR NEGATIVE  NEGATIVE Final   Comment:            The GeneXpert MRSA Assay (FDA     approved for NASAL specimens     only), is one component of a     comprehensive MRSA colonization     surveillance program. It is not     intended to diagnose MRSA     infection nor to guide or     monitor treatment for     MRSA infections.  CULTURE, EXPECTORATED SPUTUM-ASSESSMENT     Status: None   Collection Time    10/07/12  8:45 AM      Result Value Range Status   Specimen Description SPUTUM   Final   Special Requests NONE  Final    Sputum evaluation     Final   Value: MICROSCOPIC FINDINGS SUGGEST THAT THIS SPECIMEN IS NOT REPRESENTATIVE OF LOWER RESPIRATORY SECRETIONS. PLEASE RECOLLECT.     Gram Stain Report Called to,Read Back By and Verified With: Corine Shelter RN 10:15 10/07/12 ([email or web address])   Report Status 10/07/2012 FINAL   Final  CULTURE, EXPECTORATED SPUTUM-ASSESSMENT     Status: None   Collection Time    10/07/12 11:44 AM      Result Value Range Status   Specimen Description SPUTUM   Final   Special Requests Normal   Final   Sputum evaluation     Final   Value: THIS SPECIMEN IS ACCEPTABLE. RESPIRATORY CULTURE REPORT TO FOLLOW.   Report Status 10/07/2012 FINAL   Final  CULTURE, RESPIRATORY (NON-EXPECTORATED)     Status: None   Collection Time    10/07/12 11:44 AM      Result Value Range Status   Specimen Description SPUTUM   Final   Special Requests NONE   Final   Gram Stain     Final   Value: MODERATE WBC PRESENT,BOTH PMN AND MONONUCLEAR     RARE SQUAMOUS EPITHELIAL CELLS PRESENT     MODERATE GRAM VARIABLE ROD     FEW GRAM POSITIVE COCCI     IN PAIRS   Culture     Final   Value: NORMAL OROPHARYNGEAL FLORA     Performed at [facility]   Report Status PENDING   Incomplete     Studies:  Recent x-ray studies have been reviewed in detail by the Attending Physician  Scheduled Meds:  Scheduled Meds: . ipratropium  0.5 mg Nebulization QID   And  . albuterol  2.5 mg Nebulization QID  . ceFEPime (MAXIPIME) IV  1 g Intravenous Q24H  . methylPREDNISolone (SOLU-MEDROL) injection  40 mg Intravenous Daily  . pantoprazole (PROTONIX) IV  40 mg Intravenous QHS  . vancomycin  1,000 mg Intravenous Q24H   Time spent on care of this patient: 35 min   ELLIS,ALLISON L. , ANP  [facility] Office  [phone] Pager - Text Page per Loretha Stapler as per below:  On-Call/Text Page:      [email or web address]      password [email or web address]  If 7PM-7AM, please contact night-coverage [email or web address] Password [email or web address] 10/08/2012, 4:25  PM   LOS: 2 days   I have personally examined this patient and reviewed the entire database. I have reviewed the above note, made any necessary editorial changes, and agree with its content.  Lonia Blood, MD [facility]

## 2012-10-08 NOTE — Progress Notes (Signed)
Patient ID: Caleb Bauer, male   DOB: 04-12-1950, 62 y.o.   MRN: [id number]    Subjective: Pt c/o being thristy, upset that he cant have anything to drink, denies pain, nausea, or vomiting at this time.  He denies flatus or BM but RN reports one small bm yesterday, asked patient about his surgical history and he said he didn't know if he had any abdominal surgeries, we did see evidence of a ventral hernia repair on films and when I asked him about this he said "oh yeah, I guess I did"  Didn't know the date of the repair.  Objective: Vital signs in last 24 hours: Temp:  [98.3 F (36.8 C)-98.6 F (37 C)] 98.3 F (36.8 C) (08/08 0700) Pulse Rate:  [46-102] 85 (08/08 0700) Resp:  [0-35] 17 (08/08 0700) BP: (83-120)/(51-85) 109/60 mmHg (08/08 0700) SpO2:  [90 %-97 %] 96 % (08/08 0700) Last BM Date: 10/07/12  Intake/Output from previous day: 08/07 0701 - 08/08 0700 In: 3625 [I.V.:2875; IV Piggyback:750] Out: 5720 [Urine:720; Emesis/NG output:5000] Intake/Output this shift:    PE: Abd: relatively soft, nontender, faint bowel sounds, no large scars but small port scars seen General: NAD, NGT in place with about 200cc in cannister, not very reliable with information Heart; RRR Lungs; decreased BS bilateral  Lab Results:   Recent Labs  10/06/12 2305 10/07/12 0934  WBC 8.3 8.4  HGB 17.2* 16.0  HCT 50.6 46.9  PLT 129* 122*   BMET  Recent Labs  10/07/12 0930 10/07/12 0934  NA 139 140  K 4.4 5.0  CL 98 96  CO2 23 25  GLUCOSE 115* 117*  BUN 88* 90*  CREATININE 4.13* 4.06*  CALCIUM 8.9 8.9   PT/INR No results found for this basename: LABPROT, INR,  in the last 72 hours CMP     Component Value Date/Time   NA 140 10/07/2012 0934   K 5.0 10/07/2012 0934   CL 96 10/07/2012 0934   CO2 25 10/07/2012 0934   GLUCOSE 117* 10/07/2012 0934   BUN 90* 10/07/2012 0934   CREATININE 4.06* 10/07/2012 0934   CALCIUM 8.9 10/07/2012 0934   PROT 8.4* 10/06/2012 2305   ALBUMIN 1.9* 10/07/2012 0930   AST  22 10/06/2012 2305   ALT 20 10/06/2012 2305   ALKPHOS 107 10/06/2012 2305   BILITOT 1.6* 10/06/2012 2305   GFRNONAA 15* 10/07/2012 0934   GFRAA 17* 10/07/2012 0934   Lipase  No results found for this basename: lipase       Studies/Results: US Renal  10/07/2012   *RADIOLOGY REPORT*  Clinical Data: Acute renal failure, diabetes, hypertension  RENAL/URINARY TRACT ULTRASOUND COMPLETE  Comparison:  Ultrasound the abdomen of 09/17/2012  Findings:  Right Kidney:  No hydronephrosis is seen.  Bowel gas and patient body habitus obscures evaluation.  The right kidney measures 13.1 cm sagittally.  Left Kidney:  No hydronephrosis is seen.  The left kidney measures 12.9 cm.  Two cysts are present of no larger than 1.7 cm.  The left renal parenchyma is questionably echogenic, difficult to assess due to body habitus.  Bladder:  The urinary bladder is not distended and cannot be well evaluated.  IMPRESSION:  1.  No hydronephrosis. 2.  Question slightly echogenic left renal parenchyma. 3.  The urinary bladder is decompressed and cannot be evaluated.   Original Report Authenticated By: Dwyane Dee, M.D.   Dg Chest Portable 1 View  10/06/2012   *RADIOLOGY REPORT*  Clinical Data: Emesis  PORTABLE CHEST -  1 VIEW  Comparison: 09/19/2012  Findings: Cardiomegaly evident with vascular congestion.  Dense consolidation in the lingula and left lower lobe compatible with pneumonia.  Associated left effusion not excluded.  No pneumothorax.  Exam is rotated to the left.  IMPRESSION: Dense lingula and left lower lobe consolidation/collapse. Associated left effusion suspected.  Cardiomegaly with vascular congestion   Original Report Authenticated By: Judie Petit. Miles Costain, M.D.   Dg Abd Portable 2v  10/08/2012   *RADIOLOGY REPORT*  Clinical Data: 62 year old male hypertension diabetes abdominal pain.  Ileus versus small bowel obstruction.  PORTABLE ABDOMEN - 2 VIEW  Comparison: 10/07/2012 and earlier.  Findings: Portable AP supine and left-side down  lateral decubitus views of the abdomen.  NG tube in place, tip at the level of the proximal duodenum.  No pneumoperitoneum.  Continued widespread small and large bowel gas.  Sequelae of ventral abdominal hernia repair.  Small bowel loops measure between two and 4 cm diameter. Overall bowel dilatation has decreased.  Air-fluid levels are less conspicuous.  Some gas is present in the descending colon and visible rectum.  IMPRESSION: Improved bowel gas pattern.  Favor resolving bowel obstruction.  No free air.   Original Report Authenticated By: Erskine Speed, M.D.   Dg Abd Portable 2v  10/07/2012   *RADIOLOGY REPORT*  Clinical Data: Distended abdomen, vomiting.  PORTABLE ABDOMEN - 2 VIEW  Comparison: None.  Findings: Dilated stomach and bowel loops throughout the abdomen and pelvis.  This appears to most likely be predominately small bowel loops concerning for small bowel obstruction.  No free air. No organomegaly or suspicious calcification.  IMPRESSION: Dilated bowel loops in the abdomen, concerning for small bowel dilatation and small bowel obstruction.   Original Report Authenticated By: Charlett Nose, M.D.    Anti-infectives: Anti-infectives   Start     Dose/Rate Route Frequency Ordered Stop   10/08/12 0200  vancomycin (VANCOCIN) IVPB 1000 mg/200 mL premix     1,000 mg 200 mL/hr over 60 Minutes Intravenous Every 24 hours 10/07/12 1118     10/07/12 2200  ceFEPIme (MAXIPIME) 1 g in dextrose 5 % 50 mL IVPB     1 g 100 mL/hr over 30 Minutes Intravenous Every 24 hours 10/07/12 0335     10/07/12 0030  vancomycin (VANCOCIN) 1,750 mg in sodium chloride 0.9 % 500 mL IVPB     1,750 mg 250 mL/hr over 120 Minutes Intravenous  Once 10/07/12 0018 10/07/12 0352   10/07/12 0015  ceFEPIme (MAXIPIME) 1 g in dextrose 5 % 50 mL IVPB     1 g 100 mL/hr over 30 Minutes Intravenous  Once 10/07/12 0004 10/07/12 0151       Assessment/Plan Emesis: SBO vs ileus unsure etiology, films improved from yesterday but still  with dilated loops, patient has been having symptoms for 4+ weeks, NGT output was initially significant but has decreased overnight, given prolonged natural of symptoms feel like a CT of the abdomen would be helpful, would do PO contrast only given ARF, leave NGT and NPO except ice chips for right now  ARF: per attending, SCr 4.06  HCAP (healthcare-associated pneumonia): on vanc and maxipime, per attending  H/O LE cellulitis: wound is healthy but still open, continue dressing changes    LOS: 2 days    Basilio Meadow 10/08/2012

## 2012-10-09 LAB — CULTURE, RESPIRATORY W GRAM STAIN: Culture: NORMAL

## 2012-10-09 LAB — CBC
Hemoglobin: 12.7 g/dL — ABNORMAL LOW (ref 13.0–17.0)
MCH: 25.5 pg — ABNORMAL LOW (ref 26.0–34.0)
Platelets: 72 10*3/uL — ABNORMAL LOW (ref 150–400)
RBC: 4.98 MIL/uL (ref 4.22–5.81)
WBC: 9 10*3/uL (ref 4.0–10.5)

## 2012-10-09 LAB — GLUCOSE, CAPILLARY
Glucose-Capillary: 107 mg/dL — ABNORMAL HIGH (ref 70–99)
Glucose-Capillary: 84 mg/dL (ref 70–99)
Glucose-Capillary: 116 mg/dL — ABNORMAL HIGH (ref 70–99)
Glucose-Capillary: 75 mg/dL (ref 70–99)
Glucose-Capillary: 107 mg/dL — ABNORMAL HIGH (ref 70–99)

## 2012-10-09 LAB — URINE CULTURE

## 2012-10-09 LAB — BASIC METABOLIC PANEL
GFR calc Af Amer: 28 mL/min — ABNORMAL LOW (ref >90)
GFR calc non Af Amer: 24 mL/min — ABNORMAL LOW (ref >90)
Glucose, Bld: 95 mg/dL (ref 70–99)
Potassium: 3.4 mEq/L — ABNORMAL LOW (ref 3.5–5.1)
Sodium: 145 mEq/L (ref 135–145)

## 2012-10-09 LAB — PROCALCITONIN: Procalcitonin: 3.13 ng/mL

## 2012-10-09 MED ORDER — POTASSIUM CHLORIDE 10 MEQ/100ML IV SOLN
INTRAVENOUS | Status: AC
  Filled 2012-10-09: qty 100

## 2012-10-09 MED ORDER — DEXTROSE 50 % IV SOLN
INTRAVENOUS | Status: AC
  Administered 2012-10-09: 25 mL
  Filled 2012-10-09: qty 50

## 2012-10-09 MED ORDER — POTASSIUM CHLORIDE 10 MEQ/100ML IV SOLN
INTRAVENOUS | Status: AC
  Administered 2012-10-09: 10 meq
  Filled 2012-10-09: qty 100

## 2012-10-09 MED ORDER — KCL IN DEXTROSE-NACL 40-5-0.9 MEQ/L-%-% IV SOLN
INTRAVENOUS | Status: DC
  Administered 2012-10-09 – 2012-10-10 (×2): via INTRAVENOUS
  Filled 2012-10-09 (×4): qty 1000

## 2012-10-09 MED ORDER — POTASSIUM CHLORIDE 10 MEQ/100ML IV SOLN
10.0000 meq | INTRAVENOUS | Status: AC
  Administered 2012-10-09 (×3): 10 meq via INTRAVENOUS

## 2012-10-09 NOTE — Progress Notes (Signed)
[facility] Progress Note [place] TEAM 1 - Stepdown/ICU TEAM   Caleb Bauer ZOX:[id number] DOB: 02/06/1951 DOA: 10/06/2012 PCP: Pcp Not In System  Brief narrative: 62 y.o. male with severe systolic CHF, DM, and V-tach who was admitted for cardiogenic shock on 7/11 and discharged to a SNF on 7/28 after extensive diuresis. He presented to the ED with c/o N/V for 4 weeks. He reported vomiting every day, usually before eating. In the ER he was found to have pneumonia and ARF.   NOTE: pt was 114 Kg on d/c 7/28 and is 98.5 kg now.  Assessment/Plan:  Partial Small bowel obstruction/ vomiting for 4 wks.  NGT inserted with > 1000 out initally - CT abd on 8/8 c/w PSBO (proximal SB) - CCS following and directing care   ?? HCAP (healthcare-associated pneumonia) not convinced has PNA - intial CXR suggested partial collapse of left lung - CT abdomen and pelvis (8/8) revealed dependent lower lobe atelectasis with patchy opacity in the right middle and lower lobes that also could be atelectasis - he did not have any leukocytosis or fever at presentation - hypoxia could be mechanical issues with ventilation - have narrowed abx somewhat - will cont to follow   ARF with decreased urine output Most c/w prerenal azotemia due to being on diuretics and vomiting on an off for 3 wks - steadily improving with careful volume expansion - UOP now consistent and more than adequate - d/c foley   Diabetes mellitus without complication controlled without outpt meds  Hypotension, unspecified Due to volume depletion - resolved  Chronic systolic CHF- EF 81-19% Well compensated >> dry - hydrating gently due to dehydration - d/c'd digoxin temporarily due to ARF - prior "dry weight" was 114kg - currently at 96kg - follow dialy wgts closely to guide volume resuscitation   H/o V-tach Previously determined to not be a candidate for AICD per Cardiology - Cardiology suggests initiating amiodarone infusion if must  remain n.p.o. over an extended period of time - if not able to advance diet in AM 8/10 will do so (therefore keep in SDU for now)  Mild hypokalemia Replace and follow - Mg 2.0  Question of Apical thrombus last admission Previously determined per cards to not be a candidate for anticoagulation  Code Status: FULL Family Communication: no family present at time of exam Disposition Plan: SDU (due to possible need for amio gtt)  Consultants: Gen Surg  Procedures: none  Antibiotics: Vanc 8/6 >> 8/8 Cefepime 8/6 >>  DVT prophylaxis: SCDs  HPI/Subjective: Has no complaints today.  Very frustrated w/ npo status.  Denies chest pain.  No sob, f/c, nausea, or abdom pain.  Objective: Blood pressure 127/72, pulse 95, temperature 98 F (36.7 C), temperature source Oral, resp. rate 24, height 6' (1.829 m), weight 95.9 kg (211 lb 6.7 oz), SpO2 96.00%.  Intake/Output Summary (Last 24 hours) at 10/09/12 1127 Last data filed at 10/09/12 1051  Gross per 24 hour  Intake   3255 ml  Output   4025 ml  Net   -770 ml   Exam: General: AAO x 3 ,No acute respiratory distress Lungs: CTA th/o w/ no wheeze Cardiovascular: Regular rate and rhythm without murmur gallop or rub  Abdomen: no rebound - NG in place - no appreciable mass - soft - protuberent  Extremities: No significant cyanosis, clubbing, or edema bilateral lower extremities  Data Reviewed: Basic Metabolic Panel:  Recent Labs Lab 10/06/12 2305 10/07/12 0930 10/07/12 0934 10/08/12 0757 10/09/12  0610  NA 139 139 140 143 145  K 5.3* 4.4 5.0 4.0 3.4*  CL 94* 98 96 102 108  CO2 25 23 25 25 23   GLUCOSE 123* 115* 117* 138* 95  BUN 84* 88* 90* 94* 82*  CREATININE 4.13* 4.13* 4.06* 3.73* 2.71*  CALCIUM 9.5 8.9 8.9 8.0* 8.2*  MG  --   --  2.0  --   --   PHOS  --  6.6*  --   --   --    Liver Function Tests:  Recent Labs Lab 10/06/12 2305 10/07/12 0930 10/08/12 0757  AST 22  --  12  ALT 20  --  11  ALKPHOS 107  --  103   BILITOT 1.6*  --  0.8  PROT 8.4*  --  6.3  ALBUMIN 2.3* 1.9* 1.5*   CBC:  Recent Labs Lab 10/06/12 2305 10/07/12 0934 10/08/12 0757 10/09/12 0610  WBC 8.3 8.4 9.8 9.0  NEUTROABS 5.4  --   --   --   HGB 17.2* 16.0 13.7 12.7*  HCT 50.6 46.9 40.1 38.3*  MCV 76.1* 76.1* 76.4* 76.9*  PLT 129* 122* 90* 72*   BNP (last 3 results)  Recent Labs  09/25/12 0450 09/26/12 0530 10/06/12 2305  PROBNP 16109.6* 34516.0* 33980.0*   CBG:  Recent Labs Lab 10/07/12 2330 10/08/12 0554 10/08/12 1156 10/08/12 2341 10/09/12 0652  GLUCAP 130* 132* 126* 107* 84    Recent Results (from the past 240 hour(s))  CULTURE, BLOOD (ROUTINE X 2)     Status: None   Collection Time    10/07/12 12:20 AM      Result Value Range Status   Specimen Description BLOOD LEFT ARM IV START   Final   Special Requests BOTTLES DRAWN AEROBIC ONLY 10CC   Final   Culture  Setup Time     Final   Value: 10/07/2012 04:30     Performed at [facility]   Culture     Final   Value:        BLOOD CULTURE RECEIVED NO GROWTH TO DATE CULTURE WILL BE HELD FOR 5 DAYS BEFORE ISSUING A FINAL NEGATIVE REPORT     Performed at [facility]   Report Status PENDING   Incomplete  CULTURE, BLOOD (ROUTINE X 2)     Status: None   Collection Time    10/07/12 12:27 AM      Result Value Range Status   Specimen Description BLOOD RIGHT HAND   Final   Special Requests BOTTLES DRAWN AEROBIC ONLY 2CC   Final   Culture  Setup Time     Final   Value: 10/07/2012 04:30     Performed at [facility]   Culture     Final   Value:        BLOOD CULTURE RECEIVED NO GROWTH TO DATE CULTURE WILL BE HELD FOR 5 DAYS BEFORE ISSUING A FINAL NEGATIVE REPORT     Performed at [facility]   Report Status PENDING   Incomplete  MRSA PCR SCREENING     Status: None   Collection Time    10/07/12  2:09 AM      Result Value Range Status   MRSA by PCR NEGATIVE  NEGATIVE Final   Comment:            The GeneXpert MRSA Assay  (FDA     approved for NASAL specimens     only), is one component of a  comprehensive MRSA colonization     surveillance program. It is not     intended to diagnose MRSA     infection nor to guide or     monitor treatment for     MRSA infections.  CULTURE, EXPECTORATED SPUTUM-ASSESSMENT     Status: None   Collection Time    10/07/12  8:45 AM      Result Value Range Status   Specimen Description SPUTUM   Final   Special Requests NONE   Final   Sputum evaluation     Final   Value: MICROSCOPIC FINDINGS SUGGEST THAT THIS SPECIMEN IS NOT REPRESENTATIVE OF LOWER RESPIRATORY SECRETIONS. PLEASE RECOLLECT.     Gram Stain Report Called to,Read Back By and Verified With: Corine Shelter RN 10:15 10/07/12 ([email or web address])   Report Status 10/07/2012 FINAL   Final  CULTURE, EXPECTORATED SPUTUM-ASSESSMENT     Status: None   Collection Time    10/07/12 11:44 AM      Result Value Range Status   Specimen Description SPUTUM   Final   Special Requests Normal   Final   Sputum evaluation     Final   Value: THIS SPECIMEN IS ACCEPTABLE. RESPIRATORY CULTURE REPORT TO FOLLOW.   Report Status 10/07/2012 FINAL   Final  CULTURE, RESPIRATORY (NON-EXPECTORATED)     Status: None   Collection Time    10/07/12 11:44 AM      Result Value Range Status   Specimen Description SPUTUM   Final   Special Requests NONE   Final   Gram Stain     Final   Value: MODERATE WBC PRESENT,BOTH PMN AND MONONUCLEAR     RARE SQUAMOUS EPITHELIAL CELLS PRESENT     MODERATE GRAM VARIABLE ROD     FEW GRAM POSITIVE COCCI     IN PAIRS   Culture     Final   Value: NORMAL OROPHARYNGEAL FLORA     Performed at [facility]   Report Status 10/09/2012 FINAL   Final  URINE CULTURE     Status: None   Collection Time    10/07/12  4:24 PM      Result Value Range Status   Specimen Description URINE, CATHETERIZED   Final   Special Requests NONE   Final   Culture  Setup Time     Final   Value: 10/07/2012 19:00     Performed at [facility] Count     Final   Value: 80,000 COLONIES/ML     Performed at [facility]   Culture     Final   Value: GRAM NEGATIVE RODS     Performed at [facility]   Report Status PENDING   Incomplete     Studies:  Recent x-ray studies have been reviewed in detail by the Attending Physician  Scheduled Meds:  Scheduled Meds: . ceFEPime (MAXIPIME) IV  1 g Intravenous Q24H  . methylPREDNISolone (SOLU-MEDROL) injection  40 mg Intravenous Daily  . pantoprazole (PROTONIX) IV  40 mg Intravenous QHS   Time spent on care of this patient: 35 min  Lonia Blood, MD [facility] Office  [phone] Pager [phone]  On-Call/Text Page:      [email or web address]      password [facility]  10/09/2012, 11:27 AM   LOS: 3 days

## 2012-10-09 NOTE — Progress Notes (Signed)
Subjective: Feels better, some flatus this am  Objective: Vital signs in last 24 hours: Temp:  [98 F (36.7 C)-98.4 F (36.9 C)] 98 F (36.7 C) (08/09 0748) Pulse Rate:  [42-96] 88 (08/09 0700) Resp:  [14-26] 25 (08/09 0700) BP: (117-139)/(65-74) 120/71 mmHg (08/09 0600) SpO2:  [94 %-100 %] 96 % (08/09 0808) Weight:  [211 lb 6.7 oz (95.9 kg)] 211 lb 6.7 oz (95.9 kg) (08/09 0400) Last BM Date: 10/07/12  Intake/Output from previous day: 08/08 0701 - 08/09 0700 In: 4275 [P.O.:700; I.V.:2875; NG/GT:650; IV Piggyback:50] Out: 3900 [Urine:1800; Emesis/NG output:2100] Intake/Output this shift:    GI: softer, nontender, some bs present  Lab Results:   Recent Labs  10/08/12 0757 10/09/12 0610  WBC 9.8 9.0  HGB 13.7 12.7*  HCT 40.1 38.3*  PLT 90* 72*   BMET  Recent Labs  10/08/12 0757 10/09/12 0610  NA 143 145  K 4.0 3.4*  CL 102 108  CO2 25 23  GLUCOSE 138* 95  BUN 94* 82*  CREATININE 3.73* 2.71*  CALCIUM 8.0* 8.2*   PT/INR No results found for this basename: LABPROT, INR,  in the last 72 hours ABG No results found for this basename: PHART, PCO2, PO2, HCO3,  in the last 72 hours  Studies/Results: Ct Abdomen Pelvis Wo Contrast  10/08/2012   *RADIOLOGY REPORT*  Clinical Data: Follow-up small bowel obstruction.  The patient is improved clinically.  CT ABDOMEN AND PELVIS WITHOUT CONTRAST  Technique:  Multidetector CT imaging of the abdomen and pelvis was performed following the standard protocol without intravenous contrast.  Comparison: Abdominal radiographs, 10/08/2012 and 10/07/2012.  The  Findings: There is dilated proximal small bowel with a maximal dilation of 4.7 cm.  The mid and distal small bowel is decompressed.  The exact transition point is not defined, but appears to be in the left anterior mid abdomen adjacent to the left lateral margin of a hernia mesh.  There is no bowel wall thickening.  No mesenteric inflammatory changes are noted.  The heart is  moderately enlarged.  There is dependent lower lobe opacity that is most likely atelectasis, greater on the left. Patchy areas of opacity are noted in addition to this in the right middle and lower lobes. This may also be atelectasis.  An infectious or inflammatory infiltrate should be considered in the proper clinical setting.  The liver, spleen and gallbladder are unremarkable.  There is no bile duct dilation.  There is partial fatty replacement of the pancreas mostly the pancreatic head.  The pancreas is otherwise unremarkable.  No adrenal masses.  The kidneys are unremarkable.  Normal ureters.  The bladder is partly decompressed by Foley catheter.  There are no pathologically enlarged lymph nodes.  There is no ascites.  There is no free air. Degenerative changes are noted of the visualized spine.  There are no osteoblastic or osteolytic lesions.  IMPRESSION: CT findings are consistent with a partial small bowel obstruction, with the exact transition point not defined but likely in the left anterior mid abdomen.  Based on the radiographic findings, and based on the clinical findings, there is been improvement.  No bowel wall thickening to suggest ischemia.  There is no ascites.  Bilateral lower lobe opacity, most consistent with atelectasis. There is additional opacity in the right lower lobe right middle lobe that may also be atelectasis, but an infectious or inflammatory infiltrate should be considered likely in the proper clinical setting.   Original Report Authenticated By: Amie Portland, M.D.  US Renal  10/07/2012   *RADIOLOGY REPORT*  Clinical Data: Acute renal failure, diabetes, hypertension  RENAL/URINARY TRACT ULTRASOUND COMPLETE  Comparison:  Ultrasound the abdomen of 09/17/2012  Findings:  Right Kidney:  No hydronephrosis is seen.  Bowel gas and patient body habitus obscures evaluation.  The right kidney measures 13.1 cm sagittally.  Left Kidney:  No hydronephrosis is seen.  The left kidney measures  12.9 cm.  Two cysts are present of no larger than 1.7 cm.  The left renal parenchyma is questionably echogenic, difficult to assess due to body habitus.  Bladder:  The urinary bladder is not distended and cannot be well evaluated.  IMPRESSION:  1.  No hydronephrosis. 2.  Question slightly echogenic left renal parenchyma. 3.  The urinary bladder is decompressed and cannot be evaluated.   Original Report Authenticated By: Dwyane Dee, M.D.   Dg Abd Portable 2v  10/08/2012   *RADIOLOGY REPORT*  Clinical Data: 62 year old male hypertension diabetes abdominal pain.  Ileus versus small bowel obstruction.  PORTABLE ABDOMEN - 2 VIEW  Comparison: 10/07/2012 and earlier.  Findings: Portable AP supine and left-side down lateral decubitus views of the abdomen.  NG tube in place, tip at the level of the proximal duodenum.  No pneumoperitoneum.  Continued widespread small and large bowel gas.  Sequelae of ventral abdominal hernia repair.  Small bowel loops measure between two and 4 cm diameter. Overall bowel dilatation has decreased.  Air-fluid levels are less conspicuous.  Some gas is present in the descending colon and visible rectum.  IMPRESSION: Improved bowel gas pattern.  Favor resolving bowel obstruction.  No free air.   Original Report Authenticated By: Erskine Speed, M.D.   Dg Abd Portable 2v  10/07/2012   *RADIOLOGY REPORT*  Clinical Data: Distended abdomen, vomiting.  PORTABLE ABDOMEN - 2 VIEW  Comparison: None.  Findings: Dilated stomach and bowel loops throughout the abdomen and pelvis.  This appears to most likely be predominately small bowel loops concerning for small bowel obstruction.  No free air. No organomegaly or suspicious calcification.  IMPRESSION: Dilated bowel loops in the abdomen, concerning for small bowel dilatation and small bowel obstruction.   Original Report Authenticated By: Charlett Nose, M.D.    Anti-infectives: Anti-infectives   Start     Dose/Rate Route Frequency Ordered Stop   10/08/12  0200  vancomycin (VANCOCIN) IVPB 1000 mg/200 mL premix  Status:  Discontinued     1,000 mg 200 mL/hr over 60 Minutes Intravenous Every 24 hours 10/07/12 1118 10/08/12 1851   10/07/12 2200  ceFEPIme (MAXIPIME) 1 g in dextrose 5 % 50 mL IVPB     1 g 100 mL/hr over 30 Minutes Intravenous Every 24 hours 10/07/12 0335     10/07/12 0030  vancomycin (VANCOCIN) 1,750 mg in sodium chloride 0.9 % 500 mL IVPB     1,750 mg 250 mL/hr over 120 Minutes Intravenous  Once 10/07/12 0018 10/07/12 0352   10/07/12 0015  ceFEPIme (MAXIPIME) 1 g in dextrose 5 % 50 mL IVPB     1 g 100 mL/hr over 30 Minutes Intravenous  Once 10/07/12 0004 10/07/12 0151      Assessment/Plan: pSBO likely due to adhesions  Will check abdominal film today to assess contrast Cont ng/npo for now, if continues to improve possibly remove tube in am hopefully will get better conservatively  [facility] 10/09/2012

## 2012-10-10 ENCOUNTER — Inpatient Hospital Stay (HOSPITAL_COMMUNITY): Payer: Medicare Other

## 2012-10-10 LAB — GLUCOSE, CAPILLARY
Glucose-Capillary: 105 mg/dL — ABNORMAL HIGH (ref 70–99)
Glucose-Capillary: 149 mg/dL — ABNORMAL HIGH (ref 70–99)

## 2012-10-10 LAB — BASIC METABOLIC PANEL
CO2: 29 mEq/L (ref 19–32)
Calcium: 9 mg/dL (ref 8.4–10.5)
Creatinine, Ser: 2.02 mg/dL — ABNORMAL HIGH (ref 0.50–1.35)
GFR calc Af Amer: 39 mL/min — ABNORMAL LOW (ref >90)
GFR calc non Af Amer: 34 mL/min — ABNORMAL LOW (ref >90)

## 2012-10-10 MED ORDER — AMIODARONE HCL 200 MG PO TABS
200.0000 mg | ORAL_TABLET | Freq: Every day | ORAL | Status: DC
Start: 2012-10-10 — End: 2012-10-14
  Administered 2012-10-10 – 2012-10-14 (×5): 200 mg via ORAL
  Filled 2012-10-10 (×5): qty 1

## 2012-10-10 MED ORDER — ISOSORB DINITRATE-HYDRALAZINE 20-37.5 MG PO TABS
1.0000 | ORAL_TABLET | Freq: Three times a day (TID) | ORAL | Status: DC
  Administered 2012-10-10 – 2012-10-14 (×13): 1 via ORAL
  Filled 2012-10-10 (×15): qty 1

## 2012-10-10 MED ORDER — DIGOXIN 125 MCG PO TABS
0.1250 mg | ORAL_TABLET | Freq: Every day | ORAL | Status: DC
  Administered 2012-10-10 – 2012-10-14 (×5): 0.125 mg via ORAL
  Filled 2012-10-10 (×5): qty 1

## 2012-10-10 MED ORDER — ALPRAZOLAM 0.25 MG PO TABS: 0.2500 mg | ORAL_TABLET | Freq: Three times a day (TID) | ORAL | Status: DC | PRN

## 2012-10-10 MED ORDER — POTASSIUM CHLORIDE CRYS ER 20 MEQ PO TBCR
40.0000 meq | EXTENDED_RELEASE_TABLET | Freq: Every day | ORAL | Status: DC
  Administered 2012-10-10 – 2012-10-14 (×5): 40 meq via ORAL
  Filled 2012-10-10 (×5): qty 2

## 2012-10-10 MED ORDER — ASPIRIN EC 81 MG PO TBEC
81.0000 mg | DELAYED_RELEASE_TABLET | Freq: Every day | ORAL | Status: DC
  Administered 2012-10-10 – 2012-10-14 (×5): 81 mg via ORAL
  Filled 2012-10-10 (×6): qty 1

## 2012-10-10 MED ORDER — METOPROLOL TARTRATE 12.5 MG HALF TABLET
12.5000 mg | ORAL_TABLET | Freq: Two times a day (BID) | ORAL | Status: DC
  Administered 2012-10-10 – 2012-10-14 (×9): 12.5 mg via ORAL
  Filled 2012-10-10 (×10): qty 1

## 2012-10-10 MED ORDER — PANTOPRAZOLE SODIUM 40 MG PO TBEC
80.0000 mg | DELAYED_RELEASE_TABLET | Freq: Every day | ORAL | Status: DC
  Administered 2012-10-10 – 2012-10-14 (×5): 80 mg via ORAL
  Filled 2012-10-10 (×5): qty 2

## 2012-10-10 NOTE — Progress Notes (Signed)
ANTIBIOTIC CONSULT NOTE - FOLLOW UP  Pharmacy Consult for cefepime Indication: PNA  Allergies  Allergen Reactions  . Other Nausea And Vomiting    Patient stated drug is "kerein"    Patient Measurements: Height: 6' (182.9 cm) Weight: 211 lb 6.7 oz (95.9 kg) IBW/kg (Calculated) : 77.6   Vital Signs: Temp: 97.3 F (36.3 C) (08/10 1157) Temp src: Oral (08/10 1157) BP: 144/83 mmHg (08/10 1157) Pulse Rate: 95 (08/10 1202) Intake/Output from previous day: 08/09 0701 - 08/10 0700 In: 2596.3 [P.O.:660; I.V.:1526.3; NG/GT:60; IV Piggyback:350] Out: 4040 [Urine:2450; Emesis/NG output:1590] Intake/Output from this shift: Total I/O In: 945 [P.O.:720; I.V.:225] Out: 350 [Urine:200; Emesis/NG output:150]  Labs:  Recent Labs  10/08/12 0757 10/09/12 0610 10/10/12 0522  WBC 9.8 9.0  --   HGB 13.7 12.7*  --   PLT 90* 72*  --   CREATININE 3.73* 2.71* 2.02*   Estimated Creatinine Clearance: 46.1 ml/min (by C-G formula based on Cr of 2.02). No results found for this basename: VANCOTROUGH, VANCOPEAK, VANCORANDOM, GENTTROUGH, GENTPEAK, GENTRANDOM, TOBRATROUGH, TOBRAPEAK, TOBRARND, AMIKACINPEAK, AMIKACINTROU, AMIKACIN,  in the last 72 hours    Assessment: 62 yo male with possible HCAP on cefepime. SCr= 2.02 with trend down and CrCl ~35. Cultures show enterococcus in the urine (80K). Per MD-  picture is not convincing for PNA  and planning for 5 days of antibiotics.  8/7 vanc>>6/8 8/7 cefepime  8/8 resp- normal flora 8/7 blood x2- ngtd 8/7 urine- enterobacter- sens LQ, zosyn (resistant to ancef, rocephin)   Plan:  -No cefepime dose changes -Will follow renal function trend and increase frequency of cefepime if needd  Harland German, Pharm D 10/10/2012 2:03 PM

## 2012-10-10 NOTE — Progress Notes (Addendum)
HCAP (healthcare-associated pneumonia)  Assessment: Improving  Plan: Will take N/G out and allow clears today. If he improves and can go home without surgery for this,  he would benefit from CT enterography as an outpatient to see if there is a point of partial obstruction   Subjective: Feels better, no pain, stomach "empty" and he would like NG out. Passing gas, no BM  Objective: Vital signs in last 24 hours: Temp:  [97.7 F (36.5 C)-98.3 F (36.8 C)] 97.7 F (36.5 C) (08/10 0722) Pulse Rate:  [80-95] 85 (08/10 0722) Resp:  [15-32] 21 (08/10 0722) BP: (127-170)/(70-100) 164/93 mmHg (08/10 0722) SpO2:  [94 %-98 %] 96 % (08/10 0722) Last BM Date: 10/07/12  Intake/Output from previous day: 08/09 0701 - 08/10 0700 In: 2596.3 [P.O.:660; I.V.:1526.3; NG/GT:60; IV Piggyback:350] Out: 4040 [Urine:2450; Emesis/NG output:1590]  General appearance: alert, cooperative and no distress GI: soft, non-tender; bowel sounds normal; no masses,  no organomegaly  Lab Results:  Results for orders placed during the hospital encounter of 10/06/12 (from the past 24 hour(s))  GLUCOSE, CAPILLARY     Status: None   Collection Time    10/09/12 12:02 PM      Result Value Range   Glucose-Capillary 75  70 - 99 mg/dL  GLUCOSE, CAPILLARY     Status: Abnormal   Collection Time    10/09/12 12:20 PM      Result Value Range   Glucose-Capillary 116 (*) 70 - 99 mg/dL   Comment 1 Notify RN    GLUCOSE, CAPILLARY     Status: Abnormal   Collection Time    10/09/12  5:41 PM      Result Value Range   Glucose-Capillary 107 (*) 70 - 99 mg/dL   Comment 1 Notify RN    GLUCOSE, CAPILLARY     Status: Abnormal   Collection Time    10/09/12 11:53 PM      Result Value Range   Glucose-Capillary 123 (*) 70 - 99 mg/dL  GLUCOSE, CAPILLARY     Status: None   Collection Time    10/10/12  5:06 AM      Result Value Range   Glucose-Capillary 97  70 - 99 mg/dL  BASIC METABOLIC PANEL     Status: Abnormal   Collection Time    10/10/12  5:22 AM      Result Value Range   Sodium 151 (*) 135 - 145 mEq/L   Potassium 3.5  3.5 - 5.1 mEq/L   Chloride 112  96 - 112 mEq/L   CO2 29  19 - 32 mEq/L   Glucose, Bld 117 (*) 70 - 99 mg/dL   BUN 71 (*) 6 - 23 mg/dL   Creatinine, Ser 4.09 (*) 0.50 - 1.35 mg/dL   Calcium 9.0  8.4 - 81.1 mg/dL   GFR calc non Af Amer 34 (*) >90 mL/min   GFR calc Af Amer 39 (*) >90 mL/min  GLUCOSE, CAPILLARY     Status: Abnormal   Collection Time    10/10/12  7:25 AM      Result Value Range   Glucose-Capillary 105 (*) 70 - 99 mg/dL     Studies/Results Radiology  KUB shows less distention and contrast now in colon.   MEDS, Scheduled . ceFEPime (MAXIPIME) IV  1 g Intravenous Q24H  . pantoprazole (PROTONIX) IV  40 mg Intravenous QHS       LOS: 4 days    Currie Paris, MD, [facility], [place]  [phone]   10/10/2012 9:11 AM

## 2012-10-10 NOTE — Progress Notes (Addendum)
[facility] Progress Note [place] TEAM 1 - Stepdown/ICU TEAM   DENCIL CAYSON JYN:[id number] DOB: 01-Jul-1950 DOA: 10/06/2012 PCP: Pcp Not In System  Brief narrative: 62 y.o. male with severe systolic CHF, DM, and V-tach who was admitted for cardiogenic shock on 7/11 and discharged to a SNF on 7/28 after extensive diuresis. He presented to the ED with c/o N/V for 4 weeks. He reported vomiting every day, usually before eating. In the ER he was found to have pneumonia and ARF.   NOTE: pt was 114 Kg on d/c 7/28 and is 98.5 kg now.  Assessment/Plan:  Partial Small bowel obstruction/ vomiting for 4 wks.  NGT inserted with > 1000 out initally - CT abd on 8/8 c/w PSBO (proximal SB) - CCS following and directing care - NG out today - slowly advance diet as tolerated - see Gen Surg note regarding future imaging    ?? HCAP (healthcare-associated pneumonia) not convinced has PNA - intial CXR suggested partial collapse of left lung - CT abdomen and pelvis (8/8) revealed dependent lower lobe atelectasis with patchy opacity in the right middle and lower lobes that also could be atelectasis - he did not have any leukocytosis or fever at presentation - hypoxia could be mechanical issues with ventilation - plan to complete 5 days of abx tx and then follow clinically w/o further abx tx  ?enterobacter UTI UA was c/w UTI - urine cx revealed 80k colonies of cephalosporin resistant Enterobacter sp - pt is asymptomatic - will recheck UA today, and consider starting levaquin if suggestive of UTI or if repeat cx confirms same bacteria  ARF with decreased urine output Most c/w prerenal azotemia due to being on diuretics and vomiting on an off for 3 wks - steadily improving with careful volume expansion - UOP now consistent and more than adequate - crt time of recent d/c was 1.77  Diabetes mellitus without complication controlled without outpt meds - check A1c - unclear of the accuracy or hx of his DM  diagnosis  Hypotension, unspecified Due to volume depletion - resolved  HTN BP climbing post volume - adjust med tx and follow   Chronic systolic CHF- EF 86-57% Well compensated >> dry - hydrated gently due to dehydration - d/c'd digoxin temporarily due to ARF; to resume today - prior "dry weight" was 114kg - currently at 96kg - follow dialy wgts closely to guide volume resuscitation   H/o V-tach Previously determined to not be a candidate for AICD per Cardiology - Cardiology suggests initiating amiodarone infusion if must remain n.p.o. over an extended period of time - able to resume oral amio today   Mild hypokalemia Replace and follow - Mg 2.0  Question of Apical thrombus last admission Previously determined per cards to not be a candidate for anticoagulation  Code Status: FULL Family Communication: no family present at time of exam Disposition Plan: transfer to tele bed - will need to slowly advance diet over next 48hrs until able to tolerate regular diet w/ consistent BM  Consultants: Gen Surg  Procedures: none  Antibiotics: Vanc 8/6 >> 8/8 Cefepime 8/6 >>8/10  DVT prophylaxis: SCDs  HPI/Subjective: Up and using bedside commode w/ confirmed BM.  No chest pain, sob, n/v, or abdom pain.  Objective: Blood pressure 164/93, pulse 85, temperature 97.7 F (36.5 C), temperature source Oral, resp. rate 21, height 6' (1.829 m), weight 95.9 kg (211 lb 6.7 oz), SpO2 96.00%.  Intake/Output Summary (Last 24 hours) at 10/10/12 1018 Last data  filed at 10/10/12 0920  Gross per 24 hour  Intake 2341.25 ml  Output   4390 ml  Net -2048.75 ml   Exam: General: No acute respiratory distress Lungs: CTA th/o w/ no wheeze Cardiovascular: Regular rate and rhythm without murmur gallop or rub  Abdomen: no rebound - NG has been removed - no appreciable mass - soft - protuberent  Extremities: No significant cyanosis, clubbing, or edema bilateral lower extremities  Data Reviewed: Basic  Metabolic Panel:  Recent Labs Lab 10/07/12 0930 10/07/12 0934 10/08/12 0757 10/09/12 0610 10/10/12 0522  NA 139 140 143 145 151*  K 4.4 5.0 4.0 3.4* 3.5  CL 98 96 102 108 112  CO2 23 25 25 23 29   GLUCOSE 115* 117* 138* 95 117*  BUN 88* 90* 94* 82* 71*  CREATININE 4.13* 4.06* 3.73* 2.71* 2.02*  CALCIUM 8.9 8.9 8.0* 8.2* 9.0  MG  --  2.0  --   --   --   PHOS 6.6*  --   --   --   --    Liver Function Tests:  Recent Labs Lab 10/06/12 2305 10/07/12 0930 10/08/12 0757  AST 22  --  12  ALT 20  --  11  ALKPHOS 107  --  103  BILITOT 1.6*  --  0.8  PROT 8.4*  --  6.3  ALBUMIN 2.3* 1.9* 1.5*   CBC:  Recent Labs Lab 10/06/12 2305 10/07/12 0934 10/08/12 0757 10/09/12 0610  WBC 8.3 8.4 9.8 9.0  NEUTROABS 5.4  --   --   --   HGB 17.2* 16.0 13.7 12.7*  HCT 50.6 46.9 40.1 38.3*  MCV 76.1* 76.1* 76.4* 76.9*  PLT 129* 122* 90* 72*   BNP (last 3 results)  Recent Labs  09/25/12 0450 09/26/12 0530 10/06/12 2305  PROBNP 40981.1* 34516.0* 33980.0*   CBG:  Recent Labs Lab 10/09/12 1220 10/09/12 1741 10/09/12 2353 10/10/12 0506 10/10/12 0725  GLUCAP 116* 107* 123* 97 105*    Recent Results (from the past 240 hour(s))  CULTURE, BLOOD (ROUTINE X 2)     Status: None   Collection Time    10/07/12 12:20 AM      Result Value Range Status   Specimen Description BLOOD LEFT ARM IV START   Final   Special Requests BOTTLES DRAWN AEROBIC ONLY 10CC   Final   Culture  Setup Time     Final   Value: 10/07/2012 04:30     Performed at [facility]   Culture     Final   Value:        BLOOD CULTURE RECEIVED NO GROWTH TO DATE CULTURE WILL BE HELD FOR 5 DAYS BEFORE ISSUING A FINAL NEGATIVE REPORT     Performed at [facility]   Report Status PENDING   Incomplete  CULTURE, BLOOD (ROUTINE X 2)     Status: None   Collection Time    10/07/12 12:27 AM      Result Value Range Status   Specimen Description BLOOD RIGHT HAND   Final   Special Requests BOTTLES DRAWN  AEROBIC ONLY 2CC   Final   Culture  Setup Time     Final   Value: 10/07/2012 04:30     Performed at [facility]   Culture     Final   Value:        BLOOD CULTURE RECEIVED NO GROWTH TO DATE CULTURE WILL BE HELD FOR 5 DAYS BEFORE ISSUING A FINAL NEGATIVE  REPORT     Performed at [facility]   Report Status PENDING   Incomplete  MRSA PCR SCREENING     Status: None   Collection Time    10/07/12  2:09 AM      Result Value Range Status   MRSA by PCR NEGATIVE  NEGATIVE Final   Comment:            The GeneXpert MRSA Assay (FDA     approved for NASAL specimens     only), is one component of a     comprehensive MRSA colonization     surveillance program. It is not     intended to diagnose MRSA     infection nor to guide or     monitor treatment for     MRSA infections.  CULTURE, EXPECTORATED SPUTUM-ASSESSMENT     Status: None   Collection Time    10/07/12  8:45 AM      Result Value Range Status   Specimen Description SPUTUM   Final   Special Requests NONE   Final   Sputum evaluation     Final   Value: MICROSCOPIC FINDINGS SUGGEST THAT THIS SPECIMEN IS NOT REPRESENTATIVE OF LOWER RESPIRATORY SECRETIONS. PLEASE RECOLLECT.     Gram Stain Report Called to,Read Back By and Verified With: Corine Shelter RN 10:15 10/07/12 ([email or web address])   Report Status 10/07/2012 FINAL   Final  CULTURE, EXPECTORATED SPUTUM-ASSESSMENT     Status: None   Collection Time    10/07/12 11:44 AM      Result Value Range Status   Specimen Description SPUTUM   Final   Special Requests Normal   Final   Sputum evaluation     Final   Value: THIS SPECIMEN IS ACCEPTABLE. RESPIRATORY CULTURE REPORT TO FOLLOW.   Report Status 10/07/2012 FINAL   Final  CULTURE, RESPIRATORY (NON-EXPECTORATED)     Status: None   Collection Time    10/07/12 11:44 AM      Result Value Range Status   Specimen Description SPUTUM   Final   Special Requests NONE   Final   Gram Stain     Final   Value: MODERATE WBC PRESENT,BOTH PMN AND  MONONUCLEAR     RARE SQUAMOUS EPITHELIAL CELLS PRESENT     MODERATE GRAM VARIABLE ROD     FEW GRAM POSITIVE COCCI     IN PAIRS   Culture     Final   Value: NORMAL OROPHARYNGEAL FLORA     Performed at [facility]   Report Status 10/09/2012 FINAL   Final  URINE CULTURE     Status: None   Collection Time    10/07/12  4:24 PM      Result Value Range Status   Specimen Description URINE, CATHETERIZED   Final   Special Requests NONE   Final   Culture  Setup Time     Final   Value: 10/07/2012 19:00     Performed at [facility] Count     Final   Value: 80,000 COLONIES/ML     Performed at [facility]   Culture     Final   Value: ENTEROBACTER AEROGENES     Performed at [facility]   Report Status 10/09/2012 FINAL   Final   Organism ID, Bacteria ENTEROBACTER AEROGENES   Final     Studies:  Recent x-ray studies have been reviewed in detail by the Attending Physician  Scheduled Meds:  Scheduled Meds: . ceFEPime (MAXIPIME) IV  1 g Intravenous Q24H  . pantoprazole (PROTONIX) IV  40 mg Intravenous QHS   Time spent on care of this patient: 35 min  Lonia Blood, MD [facility] Office  [phone] Pager [phone]  On-Call/Text Page:      [email or web address]      password [facility]  10/10/2012, 10:18 AM   LOS: 4 days

## 2012-10-11 DIAGNOSIS — D696 Thrombocytopenia, unspecified: Secondary | ICD-10-CM

## 2012-10-11 LAB — URINALYSIS, ROUTINE W REFLEX MICROSCOPIC
Nitrite: NEGATIVE
Protein, ur: NEGATIVE mg/dL
Specific Gravity, Urine: 1.015 (ref 1.005–1.030)
Urobilinogen, UA: 0.2 mg/dL (ref 0.0–1.0)

## 2012-10-11 LAB — GLUCOSE, CAPILLARY: Glucose-Capillary: 91 mg/dL (ref 70–99)

## 2012-10-11 LAB — BASIC METABOLIC PANEL
BUN: 54 mg/dL — ABNORMAL HIGH (ref 6–23)
Chloride: 112 mEq/L (ref 96–112)
GFR calc Af Amer: 57 mL/min — ABNORMAL LOW (ref >90)
GFR calc non Af Amer: 49 mL/min — ABNORMAL LOW (ref >90)
Potassium: 4.4 mEq/L (ref 3.5–5.1)
Sodium: 145 mEq/L (ref 135–145)

## 2012-10-11 LAB — URINE MICROSCOPIC-ADD ON

## 2012-10-11 NOTE — Evaluation (Signed)
Occupational Therapy Evaluation Patient Details Name: Caleb Bauer MRN: [id number] DOB: 1950/07/15 Today's Date: 10/11/2012 Time: 6440-3474 OT Time Calculation (min): 24 min  OT Assessment / Plan / Recommendation History of present illness 62 y/o male with DM2, CHF was admitted on 8/6 with emesis and found to have a SBO and PNA. His last admission was on 7/11 for cellulitis of his left leg.  On 7/12 he developed respiratory failure and acute encephalopathy requiring mechanical ventilation. He developed hypotension that was felt to be due to sepsis-PCT was ~ 2 and all cultures have been negative. Subsequent ECHO revealed severe LV systolic dysfunction with ejection fraction of 20-25% with global hypokinesis and markedly dilated left ventricle. Also question of apical mural thrombus. It was surmised he likely had cardiogenic shock. Pt continues to have significant edema. He was D/C'd to a SNF.   Clinical Impression   Pt admitted with above. Pt currently with functional limitations due to the deficits listed below (see OT Problem List). Pt will benefit from skilled OT to increase their safety and independence with ADL and functional mobility for ADL to facilitate discharge to venue listed below.       OT Assessment  Patient needs continued OT Services    Follow Up Recommendations  SNF    Barriers to Discharge Decreased caregiver support    Equipment Recommendations  Tub/shower bench (if he goes home)       Frequency  Min 2X/week    Precautions / Restrictions Precautions Precautions: Fall Precaution Comments: monitor RA sats Restrictions Weight Bearing Restrictions: No   Pertinent Vitals/Pain LLE pain knee to mid calf (where wound is)    ADL  Eating/Feeding: Simulated;Independent Where Assessed - Eating/Feeding: Chair Grooming: Simulated;Set up Where Assessed - Grooming: Supported sitting Upper Body Bathing: Simulated;Set up Where Assessed - Upper Body Bathing: Supported  standing Lower Body Bathing: Simulated;Maximal assistance (with +2 sit to stand) Where Assessed - Lower Body Bathing: Supported sit to stand Upper Body Dressing: Simulated;Set up Where Assessed - Upper Body Dressing: Supported sitting Lower Body Dressing: Simulated;+1 Total assistance (with +2 sit to stand) Where Assessed - Lower Body Dressing: Supported sit to stand Toilet Transfer: Chief of Staff: Patient Percentage: 50% Statistician Method: Sit to stand Toileting - Clothing Manipulation and Hygiene: Performed;+1 Total assistance (with +2 sit to stand) Where Assessed - Toileting Clothing Manipulation and Hygiene: Standing Equipment Used: Gait belt;Rolling walker Transfers/Ambulation Related to ADLs: total A +2 sit to stand, Mod A stand to sit    OT Diagnosis: Generalized weakness;Acute pain  OT Problem List: Decreased strength;Decreased range of motion;Decreased activity tolerance;Impaired balance (sitting and/or standing);Decreased knowledge of use of DME or AE;Pain OT Treatment Interventions: Self-care/ADL training;Therapeutic exercise;DME and/or AE instruction;Therapeutic activities;Patient/family education;Balance training   OT Goals(Current goals can be found in the care plan section) Acute Rehab OT Goals OT Goal Formulation: With patient Time For Goal Achievement: 10/25/12 Potential to Achieve Goals: Good        Prior Functioning     Home Living Family/patient expects to be discharged to:: Skilled nursing facility Living Arrangements: Other relatives Available Help at Discharge: Family;Available PRN/intermittently Home Access: Stairs to enter Entrance Stairs-Number of Steps: 3-4 Entrance Stairs-Rails: None Home Layout: One level Home Equipment: Cane - single point;Walker - 2 wheels;Bedside commode;Grab bars - tub/shower;Hand held shower head Prior Function Level of Independence: Independent with assistive device(s) Comments: RW or  SPC before admission on 09/10/12 Communication Communication: No difficulties Dominant Hand: Right  Vision/Perception Vision - History Patient Visual Report: No change from baseline   Cognition  Cognition Arousal/Alertness: Awake/alert Behavior During Therapy: WFL for tasks assessed/performed Overall Cognitive Status: Within Functional Limits for tasks assessed    Extremity/Trunk Assessment Upper Extremity Assessment Upper Extremity Assessment: Generalized weakness     Mobility Bed Mobility Details for Bed Mobility Assistance: Pt up in recliner upon arrival Transfers Transfers: Sit to Stand;Stand to Sit Sit to Stand: 1: +2 Total assist;With upper extremity assist;With armrests;From chair/3-in-1 Sit to Stand: Patient Percentage: 50% Stand to Sit: 3: Mod assist;With upper extremity assist;With armrests;To chair/3-in-1 Details for Transfer Assistance: Pt with increased time to transition UEs from arms of recliner to RW for sit to stand        Balance Static Standing Balance Static Standing - Balance Support: Bilateral upper extremity supported;During functional activity (while getting cleaned for back peri care) Static Standing - Level of Assistance: 4: Min assist   End of Session OT - End of Session Equipment Utilized During Treatment: Gait belt;Rolling walker Activity Tolerance: Patient limited by fatigue;Patient limited by pain Patient left: in chair;with call bell/phone within reach Nurse Communication: Patient requests pain meds (and making sure pt could have a pitcher of water)       Evette Georges [phone] 10/11/2012, 9:31 AM

## 2012-10-11 NOTE — Progress Notes (Signed)
HCAP (healthcare-associated pneumonia)  Assessment: Improving from pSBO  Plan: Can advance diet.    Subjective: Stomach feels "lovely." No pain or nausea, having BM.s and overall feels much improved  Objective: Vital signs in last 24 hours: Temp:  [97.3 F (36.3 C)-98.5 F (36.9 C)] 98.5 F (36.9 C) (08/11 0438) Pulse Rate:  [81-95] 93 (08/11 0438) Resp:  [21-27] 21 (08/11 0438) BP: (100-146)/(58-88) 117/58 mmHg (08/11 0438) SpO2:  [91 %-98 %] 94 % (08/11 0438) Weight:  [213 lb 13.5 oz (97 kg)] 213 lb 13.5 oz (97 kg) (08/10 2020) Last BM Date: 10/10/12  Intake/Output from previous day: 08/10 0701 - 08/11 0700 In: 1595 [P.O.:1320; I.V.:225; IV Piggyback:50] Out: 950 [Urine:800; Emesis/NG output:150]  General appearance: alert, cooperative and no distress GI: soft, non-tender; bowel sounds normal; no masses,  no organomegaly  Lab Results:  Results for orders placed during the hospital encounter of 10/06/12 (from the past 24 hour(s))  GLUCOSE, CAPILLARY     Status: Abnormal   Collection Time    10/10/12 12:01 PM      Result Value Range   Glucose-Capillary 149 (*) 70 - 99 mg/dL  GLUCOSE, CAPILLARY     Status: Abnormal   Collection Time    10/10/12  4:23 PM      Result Value Range   Glucose-Capillary 105 (*) 70 - 99 mg/dL  GLUCOSE, CAPILLARY     Status: Abnormal   Collection Time    10/10/12  9:30 PM      Result Value Range   Glucose-Capillary 121 (*) 70 - 99 mg/dL   Comment 1 Documented in Chart     Comment 2 Notify RN    URINALYSIS, ROUTINE W REFLEX MICROSCOPIC     Status: Abnormal   Collection Time    10/11/12  2:49 AM      Result Value Range   Color, Urine YELLOW  YELLOW   APPearance CLOUDY (*) CLEAR   Specific Gravity, Urine 1.015  1.005 - 1.030   pH 5.5  5.0 - 8.0   Glucose, UA NEGATIVE  NEGATIVE mg/dL   Hgb urine dipstick MODERATE (*) NEGATIVE   Bilirubin Urine NEGATIVE  NEGATIVE   Ketones, ur NEGATIVE  NEGATIVE mg/dL   Protein, ur NEGATIVE  NEGATIVE  mg/dL   Urobilinogen, UA 0.2  0.0 - 1.0 mg/dL   Nitrite NEGATIVE  NEGATIVE   Leukocytes, UA NEGATIVE  NEGATIVE  URINE MICROSCOPIC-ADD ON     Status: Abnormal   Collection Time    10/11/12  2:49 AM      Result Value Range   Squamous Epithelial / LPF FEW (*) RARE   RBC / HPF 7-10  <3 RBC/hpf   Bacteria, UA MANY (*) RARE  BASIC METABOLIC PANEL     Status: Abnormal   Collection Time    10/11/12  6:35 AM      Result Value Range   Sodium 145  135 - 145 mEq/L   Potassium 4.4  3.5 - 5.1 mEq/L   Chloride 112  96 - 112 mEq/L   CO2 24  19 - 32 mEq/L   Glucose, Bld 91  70 - 99 mg/dL   BUN 54 (*) 6 - 23 mg/dL   Creatinine, Ser 1.61 (*) 0.50 - 1.35 mg/dL   Calcium 8.7  8.4 - 09.6 mg/dL   GFR calc non Af Amer 49 (*) >90 mL/min   GFR calc Af Amer 57 (*) >90 mL/min  GLUCOSE, CAPILLARY     Status: None  Collection Time    10/11/12  7:36 AM      Result Value Range   Glucose-Capillary 89  70 - 99 mg/dL     Studies/Results Radiology     MEDS, Scheduled . amiodarone  200 mg Oral Daily  . aspirin EC  81 mg Oral Daily  . digoxin  0.125 mg Oral Daily  . isosorbide-hydrALAZINE  1 tablet Oral TID  . metoprolol tartrate  12.5 mg Oral BID  . pantoprazole  80 mg Oral Daily  . potassium chloride SA  40 mEq Oral Daily       LOS: 5 days    Currie Paris, MD, [facility], [place] [phone]   10/11/2012 9:17 AM

## 2012-10-11 NOTE — Progress Notes (Addendum)
[facility] Progress Note    Caleb Bauer OZH:[id number] DOB: 1950-08-09 DOA: 10/06/2012 PCP: Pcp Not In System  Brief narrative: 62 y.o. male with severe systolic CHF, DM, and V-tach who was admitted for cardiogenic shock on 7/11 and discharged to a SNF on 7/28 after extensive diuresis. He presented to the ED with c/o N/V for 4 weeks. He reported vomiting every day, usually before eating. In the ER he was found to have pneumonia and ARF.   NOTE: pt was 114 Kg on d/c 7/28 and is 98.5 kg now.  Assessment/Plan:  Partial Small bowel obstruction/ vomiting for 4 wks.  NGT inserted with > 1000 out initally - CT abd on 8/8 c/w PSBO (proximal SB) - CCS following and directing care - NG out 8/10. Having BMs. Advance diet. Improving.   ?? HCAP (healthcare-associated pneumonia) not convinced has PNA - intial CXR suggested partial collapse of left lung - CT abdomen and pelvis (8/8) revealed dependent lower lobe atelectasis with patchy opacity in the right middle and lower lobes that also could be atelectasis - he did not have any leukocytosis or fever at presentation - hypoxia could be mechanical issues with ventilation - plan to complete 5 days of abx tx and then follow clinically w/o further abx tx  ?enterobacter UTI UA was c/w UTI - urine cx revealed 80k colonies of cephalosporin resistant Enterobacter sp - pt is asymptomatic - will recheck UA today, and consider starting levaquin if suggestive of UTI or if repeat cx confirms same bacteria. UA did not comment on WBC although there were many bacteria. He denies dysuria or urinary frequency. No fever or leukocytosis-hold antibiotics.  ARF with decreased urine output Most c/w prerenal azotemia due to being on diuretics and vomiting on an off for 3 wks - steadily improving with careful volume expansion - UOP now consistent and more than adequate - crt time of recent d/c was 1.77. Creatinine improving-1.4 it today.  Diabetes mellitus without  complication controlled without outpt meds. Recent A1c on 7/13 was 6.2.  Hypotension, unspecified Due to volume depletion - resolved  HTN BP climbing post volume - adjust med tx and follow. Better controlled.   Chronic systolic CHF- EF 62-95% Well compensated >> dry - hydrated gently due to dehydration - d/c'd digoxin temporarily due to ARF; to resume today - prior "dry weight" was 114kg - currently at 96kg - follow dialy wgts closely to guide volume resuscitation   H/o V-tach Previously determined to not be a candidate for AICD per Cardiology - Cardiology suggests initiating amiodarone infusion if must remain n.p.o. over an extended period of time - able to resume oral amio. Resumed Amiodarone. Had 14 beats of NSVT on 8/11 evening- asymptomatic and stable.  Mild hypokalemia Replace and follow - Mg 2.0. Corrected.  Question of Apical thrombus last admission Previously determined per cards to not be a candidate for anticoagulation  Thrombocytopenia Unclear etiology. FU CBC. Seem chronic  Code Status: FULL Family Communication: no family present at time of exam Disposition Plan: SNF when stable.  Consultants: Gen Surg  Procedures: NGT  Antibiotics: Vanc 8/6 >> 8/8 Cefepime 8/6 >>8/10  DVT prophylaxis: SCDs  HPI/Subjective: Denies complaints. Had soft large BM while MD in room.  Objective: Blood pressure 122/80, pulse 88, temperature 97.8 F (36.6 C), temperature source Oral, resp. rate 20, height 6\' 1"  (1.854 m), weight 97 kg (213 lb 13.5 oz), SpO2 98.00%.  Intake/Output Summary (Last 24 hours) at 10/11/12 1751 Last data filed at  10/11/12 1300  Gross per 24 hour  Intake    650 ml  Output    750 ml  Net   -100 ml   Exam: General: No acute respiratory distress. Sitting on chair. Lungs: CTA th/o w/ no wheeze Cardiovascular: Regular rate and rhythm without murmur gallop or rub  Abdomen: no rebound - NG has been removed - no appreciable mass - soft - protuberent   Extremities: No significant cyanosis, clubbing, or edema bilateral lower extremities  Data Reviewed: Basic Metabolic Panel:  Recent Labs Lab 10/07/12 0930 10/07/12 0934 10/08/12 0757 10/09/12 0610 10/10/12 0522 10/11/12 0635  NA 139 140 143 145 151* 145  K 4.4 5.0 4.0 3.4* 3.5 4.4  CL 98 96 102 108 112 112  CO2 23 25 25 23 29 24   GLUCOSE 115* 117* 138* 95 117* 91  BUN 88* 90* 94* 82* 71* 54*  CREATININE 4.13* 4.06* 3.73* 2.71* 2.02* 1.48*  CALCIUM 8.9 8.9 8.0* 8.2* 9.0 8.7  MG  --  2.0  --   --   --   --   PHOS 6.6*  --   --   --   --   --    Liver Function Tests:  Recent Labs Lab 10/06/12 2305 10/07/12 0930 10/08/12 0757  AST 22  --  12  ALT 20  --  11  ALKPHOS 107  --  103  BILITOT 1.6*  --  0.8  PROT 8.4*  --  6.3  ALBUMIN 2.3* 1.9* 1.5*   CBC:  Recent Labs Lab 10/06/12 2305 10/07/12 0934 10/08/12 0757 10/09/12 0610  WBC 8.3 8.4 9.8 9.0  NEUTROABS 5.4  --   --   --   HGB 17.2* 16.0 13.7 12.7*  HCT 50.6 46.9 40.1 38.3*  MCV 76.1* 76.1* 76.4* 76.9*  PLT 129* 122* 90* 72*   BNP (last 3 results)  Recent Labs  09/25/12 0450 09/26/12 0530 10/06/12 2305  PROBNP 81191.4* 34516.0* 33980.0*   CBG:  Recent Labs Lab 10/10/12 1623 10/10/12 2130 10/11/12 0736 10/11/12 1208 10/11/12 1632  GLUCAP 105* 121* 89 80 125*    Recent Results (from the past 240 hour(s))  CULTURE, BLOOD (ROUTINE X 2)     Status: None   Collection Time    10/07/12 12:20 AM      Result Value Range Status   Specimen Description BLOOD LEFT ARM IV START   Final   Special Requests BOTTLES DRAWN AEROBIC ONLY 10CC   Final   Culture  Setup Time     Final   Value: 10/07/2012 04:30     Performed at [facility]   Culture     Final   Value:        BLOOD CULTURE RECEIVED NO GROWTH TO DATE CULTURE WILL BE HELD FOR 5 DAYS BEFORE ISSUING A FINAL NEGATIVE REPORT     Performed at [facility]   Report Status PENDING   Incomplete  CULTURE, BLOOD (ROUTINE X 2)      Status: None   Collection Time    10/07/12 12:27 AM      Result Value Range Status   Specimen Description BLOOD RIGHT HAND   Final   Special Requests BOTTLES DRAWN AEROBIC ONLY 2CC   Final   Culture  Setup Time     Final   Value: 10/07/2012 04:30     Performed at [facility]   Culture     Final   Value:  BLOOD CULTURE RECEIVED NO GROWTH TO DATE CULTURE WILL BE HELD FOR 5 DAYS BEFORE ISSUING A FINAL NEGATIVE REPORT     Performed at [facility]   Report Status PENDING   Incomplete  MRSA PCR SCREENING     Status: None   Collection Time    10/07/12  2:09 AM      Result Value Range Status   MRSA by PCR NEGATIVE  NEGATIVE Final   Comment:            The GeneXpert MRSA Assay (FDA     approved for NASAL specimens     only), is one component of a     comprehensive MRSA colonization     surveillance program. It is not     intended to diagnose MRSA     infection nor to guide or     monitor treatment for     MRSA infections.  CULTURE, EXPECTORATED SPUTUM-ASSESSMENT     Status: None   Collection Time    10/07/12  8:45 AM      Result Value Range Status   Specimen Description SPUTUM   Final   Special Requests NONE   Final   Sputum evaluation     Final   Value: MICROSCOPIC FINDINGS SUGGEST THAT THIS SPECIMEN IS NOT REPRESENTATIVE OF LOWER RESPIRATORY SECRETIONS. PLEASE RECOLLECT.     Gram Stain Report Called to,Read Back By and Verified With: Corine Shelter RN 10:15 10/07/12 ([email or web address])   Report Status 10/07/2012 FINAL   Final  CULTURE, EXPECTORATED SPUTUM-ASSESSMENT     Status: None   Collection Time    10/07/12 11:44 AM      Result Value Range Status   Specimen Description SPUTUM   Final   Special Requests Normal   Final   Sputum evaluation     Final   Value: THIS SPECIMEN IS ACCEPTABLE. RESPIRATORY CULTURE REPORT TO FOLLOW.   Report Status 10/07/2012 FINAL   Final  CULTURE, RESPIRATORY (NON-EXPECTORATED)     Status: None   Collection Time    10/07/12 11:44 AM       Result Value Range Status   Specimen Description SPUTUM   Final   Special Requests NONE   Final   Gram Stain     Final   Value: MODERATE WBC PRESENT,BOTH PMN AND MONONUCLEAR     RARE SQUAMOUS EPITHELIAL CELLS PRESENT     MODERATE GRAM VARIABLE ROD     FEW GRAM POSITIVE COCCI     IN PAIRS   Culture     Final   Value: NORMAL OROPHARYNGEAL FLORA     Performed at [facility]   Report Status 10/09/2012 FINAL   Final  URINE CULTURE     Status: None   Collection Time    10/07/12  4:24 PM      Result Value Range Status   Specimen Description URINE, CATHETERIZED   Final   Special Requests NONE   Final   Culture  Setup Time     Final   Value: 10/07/2012 19:00     Performed at [facility] Count     Final   Value: 80,000 COLONIES/ML     Performed at [facility]   Culture     Final   Value: ENTEROBACTER AEROGENES     Performed at [facility]   Report Status 10/09/2012 FINAL   Final   Organism ID, Bacteria ENTEROBACTER AEROGENES   Final  Studies:  Recent x-ray studies have been reviewed in detail by the Attending Physician  Scheduled Meds:  Scheduled Meds: . amiodarone  200 mg Oral Daily  . aspirin EC  81 mg Oral Daily  . digoxin  0.125 mg Oral Daily  . isosorbide-hydrALAZINE  1 tablet Oral TID  . metoprolol tartrate  12.5 mg Oral BID  . pantoprazole  80 mg Oral Daily  . potassium chloride SA  40 mEq Oral Daily   Time spent on care of this patient: 35 min  [facility] Office  [phone] Pager [phone]  On-Call/Text Page:      [email or web address]      password [facility]  10/11/2012, 5:51 PM   LOS: 5 days

## 2012-10-11 NOTE — Evaluation (Signed)
Physical Therapy Evaluation Patient Details Name: Caleb Bauer MRN: [id number] DOB: 12/26/1950 Today's Date: 10/11/2012 Time: 4098-1191 PT Time Calculation (min): 22 min  PT Assessment / Plan / Recommendation History of Present Illness  62 y/o male with DM2, CHF was admitted on 8/6 with emesis and found to have a SBO and PNA. His last admission was on 7/11 for cellulitis of his left leg.  On 7/12 he developed respiratory failure and acute encephalopathy requiring mechanical ventilation. He developed hypotension that was felt to be due to sepsis-PCT was ~ 2 and all cultures have been negative. Subsequent ECHO revealed severe LV systolic dysfunction with ejection fraction of 20-25% with global hypokinesis and markedly dilated left ventricle. Also question of apical mural thrombus. It was surmised he likely had cardiogenic shock. Pt continues to have significant edema. He was D/C'd to a SNF.   Clinical Impression  Pt admitted with HCAP. Pt currently with functional limitations due to the deficits listed below (see PT Problem List).  Pt will benefit from skilled PT to increase their independence and safety with mobility to allow discharge to the venue listed below.  Pt with limited eval due to pain in L LE stating from knee as well as lateral lower leg wound.     PT Assessment  Patient needs continued PT services    Follow Up Recommendations  SNF    Does the patient have the potential to tolerate intense rehabilitation      Barriers to Discharge        Equipment Recommendations  None recommended by PT    Recommendations for Other Services     Frequency Min 3X/week    Precautions / Restrictions Precautions Precautions: Fall Precaution Comments: monitor RA sats Restrictions Weight Bearing Restrictions: No   Pertinent Vitals/Pain L LE pain, RN notified, repositioned to comfort SaO2 92% at rest room air SaO2 89% room air after sit to stands and quickly increased back to 92% upon  sitting      Mobility  Bed Mobility Details for Bed Mobility Assistance: Pt up in recliner upon arrival Transfers Transfers: Sit to Stand;Stand to Sit Sit to Stand: 1: +2 Total assist;With upper extremity assist;With armrests;From chair/3-in-1 Sit to Stand: Patient Percentage: 50% Stand to Sit: 3: Mod assist;With upper extremity assist;With armrests;To chair/3-in-1 Details for Transfer Assistance: Pt with increased time to transition UEs from arms of recliner to RW for sit to stand Ambulation/Gait Ambulation/Gait Assistance: Not tested (comment) (pt felt unable to ambulate today due to L LE pain)    Exercises     PT Diagnosis: Difficulty walking;Generalized weakness  PT Problem List: Decreased strength;Decreased activity tolerance;Decreased balance;Decreased mobility;Obesity PT Treatment Interventions: DME instruction;Gait training;Functional mobility training;Therapeutic activities;Therapeutic exercise;Patient/family education;Balance training     PT Goals(Current goals can be found in the care plan section) Acute Rehab PT Goals PT Goal Formulation: With patient Time For Goal Achievement: 10/25/12 Potential to Achieve Goals: Good  Visit Information  Last PT Received On: 10/11/12 Assistance Needed: +2 History of Present Illness: 62 y/o male with DM2, CHF was admitted on 8/6 with emesis and found to have a SBO and PNA. His last admission was on 7/11 for cellulitis of his left leg.  On 7/12 he developed respiratory failure and acute encephalopathy requiring mechanical ventilation. He developed hypotension that was felt to be due to sepsis-PCT was ~ 2 and all cultures have been negative. Subsequent ECHO revealed severe LV systolic dysfunction with ejection fraction of 20-25% with global hypokinesis and markedly dilated  left ventricle. Also question of apical mural thrombus. It was surmised he likely had cardiogenic shock. Pt continues to have significant edema. He was D/C'd to a SNF.        Prior Functioning  Home Living Family/patient expects to be discharged to:: Skilled nursing facility Living Arrangements: Other relatives Available Help at Discharge: Family;Available PRN/intermittently Home Access: Stairs to enter Entrance Stairs-Number of Steps: 3-4 Entrance Stairs-Rails: None Home Layout: One level Home Equipment: Cane - single point;Walker - 2 wheels;Bedside commode;Grab bars - tub/shower;Hand held shower head Prior Function Level of Independence: Independent with assistive device(s) Comments: RW or SPC before admission on 09/10/12 Communication Communication: No difficulties Dominant Hand: Right    Cognition  Cognition Arousal/Alertness: Awake/alert Behavior During Therapy: WFL for tasks assessed/performed Overall Cognitive Status: Within Functional Limits for tasks assessed    Extremity/Trunk Assessment Upper Extremity Assessment Upper Extremity Assessment: Generalized weakness Lower Extremity Assessment Lower Extremity Assessment: LLE deficits/detail;RLE deficits/detail RLE Deficits / Details: limited AROM of ankle, grossly 3/5 throughout LLE Deficits / Details: limited AROM of ankle, grossly 3/5 throughout LLE: Unable to fully assess due to pain   Balance Static Standing Balance Static Standing - Balance Support: Bilateral upper extremity supported;During functional activity (while getting cleaned for back peri care) Static Standing - Level of Assistance: 4: Min assist  End of Session PT - End of Session Equipment Utilized During Treatment: Gait belt Activity Tolerance: Patient limited by pain Patient left: in chair;with call bell/phone within reach  GP     Barbera Perritt,KATHrine E 10/11/2012, 12:03 PM Zenovia Jarred, PT, DPT 10/11/2012 Pager: [phone]

## 2012-10-11 NOTE — Clinical Social Work Note (Signed)
[address] CSW will sign off, and [address] CSW will continue to follow for discharge planning needs. CSW updated niece on response from [employer] and they responded with a no. CSW called [facility] and left a voice message. CSW informed niece that an extended search will need to be made for SNF.   Rozetta Nunnery MSW, [employer] [phone]

## 2012-10-11 NOTE — Progress Notes (Signed)
Met with patient and niece at bedside to discuss SNF- patient was at [facility] and they want to pursue a different SNF at d/c- awaiting word from [facility] (their preference) and will also purise other SNF options in [facility]- updated patient and niece to above- will update tomorrow-  Reece Levy, MSW [phone]

## 2012-10-11 NOTE — Progress Notes (Signed)
14 beats of v-tach,assessed,patient is alert and oriented x 4,skin warm and dry,no complaint,not in distress.vitals115/70 92 97.2 18 and 1000% on room air.will monitor.on call md made aware.

## 2012-10-12 LAB — CBC
HCT: 37.9 % — ABNORMAL LOW (ref 39.0–52.0)
Hemoglobin: 12.5 g/dL — ABNORMAL LOW (ref 13.0–17.0)
MCH: 25.7 pg — ABNORMAL LOW (ref 26.0–34.0)
MCV: 78 fL (ref 78.0–100.0)
RBC: 4.86 MIL/uL (ref 4.22–5.81)
WBC: 9 10*3/uL (ref 4.0–10.5)

## 2012-10-12 LAB — URINE CULTURE: Colony Count: NO GROWTH

## 2012-10-12 LAB — BASIC METABOLIC PANEL
BUN: 44 mg/dL — ABNORMAL HIGH (ref 6–23)
CO2: 24 mEq/L (ref 19–32)
Calcium: 8.6 mg/dL (ref 8.4–10.5)
Chloride: 108 mEq/L (ref 96–112)
Creatinine, Ser: 1.39 mg/dL — ABNORMAL HIGH (ref 0.50–1.35)
Glucose, Bld: 98 mg/dL (ref 70–99)

## 2012-10-12 LAB — GLUCOSE, CAPILLARY
Glucose-Capillary: 111 mg/dL — ABNORMAL HIGH (ref 70–99)
Glucose-Capillary: 91 mg/dL (ref 70–99)

## 2012-10-12 NOTE — Progress Notes (Signed)
Patient had a SNF bed offer at [facility] but it has been rescinded- patient is Medicaid only- have faxed info to additional counties in search of [place] SNF bed- will advise as offers are rec'd.  Reece Levy, MSW [phone]

## 2012-10-12 NOTE — Consult Note (Addendum)
WOC consult Note Reason for Consult: Consult requested for left leg wound.  Pt states he hit it against his bed at home and "it has gotten worse during the past week." Wound type:Full thickness abrasion Left outer calf 2X.2X.5cm 10% yellow, 90% red Mod amt yellow drainage, no odor Dressing procedure/placement/frequency: Aquacel to absorb drainage and promote healing.      Sacrum with stage 2 wound Pressure Ulcer POA: Yes; pt was noted to have this on previous admission in July. Measurement:1X1X.1cm Wound bed:100% pink and moist Drainage (amount, consistency, odor) no odor, small amt yellow drainage. Plan:  Sacral dressing was previously intact to site but was becoming frequently soiled with incontinent stool.   Barrier cream to protect and promote healing.  Please re-consult if further assistance is needed.  Thank-you,  Cammie Mcgee MSN, RN, CWOCN, [place], CNS [phone]

## 2012-10-12 NOTE — Progress Notes (Signed)
[facility] Progress Note    Caleb Bauer RUE:[id number] DOB: 20-Dec-1950 DOA: 10/06/2012 PCP: Pcp Not In System  Brief narrative: 62 y.o. male with severe systolic CHF, DM, and V-tach who was admitted for cardiogenic shock on 7/11 and discharged to a SNF on 7/28 after extensive diuresis. He presented to the ED with c/o N/V for 4 weeks. He reported vomiting every day, usually before eating. In the ER he was found to have pneumonia and ARF.   NOTE: pt was 114 Kg on d/c 7/28 and is 98.5 kg now.  Assessment/Plan:  Partial Small bowel obstruction/ vomiting for 4 wks.  NGT inserted with > 1000 out initally - CT abd on 8/8 c/w PSBO (proximal SB) - CCS following and directing care - NG out 8/10. Having BMs. Advance diet - to low fibre diet on 8/12. CT enterography after DC- per CCS recommendation.   ?? HCAP (healthcare-associated pneumonia) not convinced had PNA - intial CXR suggested partial collapse of left lung - CT abdomen and pelvis (8/8) revealed dependent lower lobe atelectasis with patchy opacity in the right middle and lower lobes that also could be atelectasis - he did not have any leukocytosis or fever at presentation - hypoxia could be mechanical issues with ventilation - completed 5 days of abx tx and then follow clinically w/o further abx tx  ?enterobacter UTI UA was c/w UTI - urine cx revealed 80k colonies of cephalosporin resistant Enterobacter sp - pt is asymptomatic. UA did not comment on WBC although there were many bacteria. Repeat UCx negative. No fever or leukocytosis-No antibiotics.  ARF with decreased urine output Most c/w prerenal azotemia due to being on diuretics and vomiting on an off for 3 wks - steadily improving with careful volume expansion - UOP now consistent and more than adequate - crt time of recent d/c was 1.77. Creatinine improving-1.39 today.  Diabetes mellitus without complication controlled without outpt meds. Recent A1c on 7/13 was  6.2.  Hypotension, unspecified Due to volume depletion - resolved  HTN BP climbing post volume - adjust med tx and follow. Better controlled.   Chronic systolic CHF- EF 14-78% Well compensated >> dry - hydrated gently due to dehydration - d/c'd digoxin temporarily due to ARF & resumed - prior "dry weight" was 114kg - currently at 96kg - follow dialy wgts closely to guide volume resuscitation   H/o V-tach Previously determined to not be a candidate for AICD per Cardiology - Cardiology suggests initiating amiodarone infusion if must remain n.p.o. over an extended period of time - Resumed Amiodarone. Had 14 beats of NSVT on 8/11 evening & 7 beats on 8/12 am. Stable.  Mild hypokalemia Mg 2.0. Corrected.  Question of Apical thrombus last admission Previously determined per cards to not be a candidate for anticoagulation  Thrombocytopenia Unclear etiology. FU CBC. Seem chronic  Code Status: FULL Family Communication: no family present at time of exam Disposition Plan: SNF when stable- ? 8/13.  Consultants: Gen Surg  Procedures: NGT - dced  Antibiotics: Vanc 8/6 >> 8/8 Cefepime 8/6 >>8/10  DVT prophylaxis: SCDs  HPI/Subjective: Denies complaints. Denies dyspnea or pain.   Objective: Blood pressure 111/75, pulse 84, temperature 98.7 F (37.1 C), temperature source Oral, resp. rate 20, height 6\' 1"  (1.854 m), weight 98.9 kg (218 lb 0.6 oz), SpO2 97.00%.  Intake/Output Summary (Last 24 hours) at 10/12/12 1117 Last data filed at 10/12/12 1109  Gross per 24 hour  Intake    360 ml  Output  1100 ml  Net   -740 ml   Exam: General: No acute respiratory distress. Lying supine in bed. Lungs: CTA th/o w/ no wheeze Cardiovascular: Regular rate and rhythm without murmur gallop or rub  Abdomen: no rebound - NG has been removed - no appreciable mass - soft - protuberent  Extremities: No significant cyanosis, clubbing, or edema bilateral lower extremities CNS: Alert & oriented.  No focal deficits. Extremities: symmetric 5/5 power. Dressing on left leg wound- C/D/I  Data Reviewed: Basic Metabolic Panel:  Recent Labs Lab 10/07/12 0930 10/07/12 0934 10/08/12 0757 10/09/12 0610 10/10/12 0522 10/11/12 0635 10/12/12 0502  NA 139 140 143 145 151* 145 142  K 4.4 5.0 4.0 3.4* 3.5 4.4 4.5  CL 98 96 102 108 112 112 108  CO2 23 25 25 23 29 24 24   GLUCOSE 115* 117* 138* 95 117* 91 98  BUN 88* 90* 94* 82* 71* 54* 44*  CREATININE 4.13* 4.06* 3.73* 2.71* 2.02* 1.48* 1.39*  CALCIUM 8.9 8.9 8.0* 8.2* 9.0 8.7 8.6  MG  --  2.0  --   --   --   --   --   PHOS 6.6*  --   --   --   --   --   --    Liver Function Tests:  Recent Labs Lab 10/06/12 2305 10/07/12 0930 10/08/12 0757  AST 22  --  12  ALT 20  --  11  ALKPHOS 107  --  103  BILITOT 1.6*  --  0.8  PROT 8.4*  --  6.3  ALBUMIN 2.3* 1.9* 1.5*   CBC:  Recent Labs Lab 10/06/12 2305 10/07/12 0934 10/08/12 0757 10/09/12 0610 10/12/12 0502  WBC 8.3 8.4 9.8 9.0 9.0  NEUTROABS 5.4  --   --   --   --   HGB 17.2* 16.0 13.7 12.7* 12.5*  HCT 50.6 46.9 40.1 38.3* 37.9*  MCV 76.1* 76.1* 76.4* 76.9* 78.0  PLT 129* 122* 90* 72* 85*   BNP (last 3 results)  Recent Labs  09/25/12 0450 09/26/12 0530 10/06/12 2305  PROBNP 26983.0* 34516.0* 33980.0*   CBG:  Recent Labs Lab 10/11/12 0736 10/11/12 1208 10/11/12 1632 10/11/12 2145 10/12/12 0800  GLUCAP 89 80 125* 91 89    Recent Results (from the past 240 hour(s))  CULTURE, BLOOD (ROUTINE X 2)     Status: None   Collection Time    10/07/12 12:20 AM      Result Value Range Status   Specimen Description BLOOD LEFT ARM IV START   Final   Special Requests BOTTLES DRAWN AEROBIC ONLY 10CC   Final   Culture  Setup Time     Final   Value: 10/07/2012 04:30     Performed at [facility]   Culture     Final   Value:        BLOOD CULTURE RECEIVED NO GROWTH TO DATE CULTURE WILL BE HELD FOR 5 DAYS BEFORE ISSUING A FINAL NEGATIVE REPORT     Performed at  [facility]   Report Status PENDING   Incomplete  CULTURE, BLOOD (ROUTINE X 2)     Status: None   Collection Time    10/07/12 12:27 AM      Result Value Range Status   Specimen Description BLOOD RIGHT HAND   Final   Special Requests BOTTLES DRAWN AEROBIC ONLY 2CC   Final   Culture  Setup Time     Final   Value:  10/07/2012 04:30     Performed at [facility]   Culture     Final   Value:        BLOOD CULTURE RECEIVED NO GROWTH TO DATE CULTURE WILL BE HELD FOR 5 DAYS BEFORE ISSUING A FINAL NEGATIVE REPORT     Performed at [facility]   Report Status PENDING   Incomplete  MRSA PCR SCREENING     Status: None   Collection Time    10/07/12  2:09 AM      Result Value Range Status   MRSA by PCR NEGATIVE  NEGATIVE Final   Comment:            The GeneXpert MRSA Assay (FDA     approved for NASAL specimens     only), is one component of a     comprehensive MRSA colonization     surveillance program. It is not     intended to diagnose MRSA     infection nor to guide or     monitor treatment for     MRSA infections.  CULTURE, EXPECTORATED SPUTUM-ASSESSMENT     Status: None   Collection Time    10/07/12  8:45 AM      Result Value Range Status   Specimen Description SPUTUM   Final   Special Requests NONE   Final   Sputum evaluation     Final   Value: MICROSCOPIC FINDINGS SUGGEST THAT THIS SPECIMEN IS NOT REPRESENTATIVE OF LOWER RESPIRATORY SECRETIONS. PLEASE RECOLLECT.     Gram Stain Report Called to,Read Back By and Verified With: Corine Shelter RN 10:15 10/07/12 ([email or web address])   Report Status 10/07/2012 FINAL   Final  CULTURE, EXPECTORATED SPUTUM-ASSESSMENT     Status: None   Collection Time    10/07/12 11:44 AM      Result Value Range Status   Specimen Description SPUTUM   Final   Special Requests Normal   Final   Sputum evaluation     Final   Value: THIS SPECIMEN IS ACCEPTABLE. RESPIRATORY CULTURE REPORT TO FOLLOW.   Report Status 10/07/2012 FINAL   Final  CULTURE,  RESPIRATORY (NON-EXPECTORATED)     Status: None   Collection Time    10/07/12 11:44 AM      Result Value Range Status   Specimen Description SPUTUM   Final   Special Requests NONE   Final   Gram Stain     Final   Value: MODERATE WBC PRESENT,BOTH PMN AND MONONUCLEAR     RARE SQUAMOUS EPITHELIAL CELLS PRESENT     MODERATE GRAM VARIABLE ROD     FEW GRAM POSITIVE COCCI     IN PAIRS   Culture     Final   Value: NORMAL OROPHARYNGEAL FLORA     Performed at [facility]   Report Status 10/09/2012 FINAL   Final  URINE CULTURE     Status: None   Collection Time    10/07/12  4:24 PM      Result Value Range Status   Specimen Description URINE, CATHETERIZED   Final   Special Requests NONE   Final   Culture  Setup Time     Final   Value: 10/07/2012 19:00     Performed at [facility] Count     Final   Value: 80,000 COLONIES/ML     Performed at [facility]   Culture     Final   Value: ENTEROBACTER AEROGENES  Performed at [facility]   Report Status 10/09/2012 FINAL   Final   Organism ID, Bacteria ENTEROBACTER AEROGENES   Final  URINE CULTURE     Status: None   Collection Time    10/11/12  2:49 AM      Result Value Range Status   Specimen Description URINE, RANDOM   Final   Special Requests NONE   Final   Culture  Setup Time     Final   Value: 10/11/2012 09:52     Performed at [facility] Count     Final   Value: NO GROWTH     Performed at [facility]   Culture     Final   Value: NO GROWTH     Performed at [facility]   Report Status 10/12/2012 FINAL   Final     Studies:  Recent x-ray studies have been reviewed in detail by the Attending Physician  Scheduled Meds:  Scheduled Meds: . amiodarone  200 mg Oral Daily  . aspirin EC  81 mg Oral Daily  . digoxin  0.125 mg Oral Daily  . isosorbide-hydrALAZINE  1 tablet Oral TID  . metoprolol tartrate  12.5 mg Oral BID  . pantoprazole  80 mg Oral  Daily  . potassium chloride SA  40 mEq Oral Daily   Time spent on care of this patient: 15 min  [facility] Office  [phone] Pager [phone]  On-Call/Text Page:      [email or web address]      password [facility]  10/12/2012, 11:17 AM   LOS: 6 days

## 2012-10-12 NOTE — Progress Notes (Signed)
  Subjective: Feels better no complaints, having BM's.  Tolerating full liquids.  Objective: Vital signs in last 24 hours: Temp:  [97.8 F (36.6 C)-98.6 F (37 C)] 97.9 F (36.6 C) (08/12 0516) Pulse Rate:  [80-95] 80 (08/12 0516) Resp:  [20-22] 22 (08/12 0516) BP: (106-122)/(66-80) 111/70 mmHg (08/12 0516) SpO2:  [95 %-100 %] 95 % (08/12 0516) Weight:  [98.9 kg (218 lb 0.6 oz)] 98.9 kg (218 lb 0.6 oz) (08/11 2200) Last BM Date: 10/11/12 4 stools yesterday and one this AM Full liquid diet started yesterday Afebrile, BSS Labs OK  Intake/Output from previous day: 08/11 0701 - 08/12 0700 In: 240 [P.O.:240] Out: 850 [Urine:850] Intake/Output this shift:    General appearance: alert, cooperative and no distress GI: soft, non-tender; bowel sounds normal; no masses,  no organomegaly  Lab Results:   Recent Labs  10/12/12 0502  WBC 9.0  HGB 12.5*  HCT 37.9*  PLT 85*    BMET  Recent Labs  10/11/12 0635 10/12/12 0502  NA 145 142  K 4.4 4.5  CL 112 108  CO2 24 24  GLUCOSE 91 98  BUN 54* 44*  CREATININE 1.48* 1.39*  CALCIUM 8.7 8.6   PT/INR No results found for this basename: LABPROT, INR,  in the last 72 hours   Recent Labs Lab 10/06/12 2305 10/07/12 0930 10/08/12 0757  AST 22  --  12  ALT 20  --  11  ALKPHOS 107  --  103  BILITOT 1.6*  --  0.8  PROT 8.4*  --  6.3  ALBUMIN 2.3* 1.9* 1.5*     Lipase  No results found for this basename: lipase     Studies/Results: No results found.  Medications: . amiodarone  200 mg Oral Daily  . aspirin EC  81 mg Oral Daily  . digoxin  0.125 mg Oral Daily  . isosorbide-hydrALAZINE  1 tablet Oral TID  . metoprolol tartrate  12.5 mg Oral BID  . pantoprazole  80 mg Oral Daily  . potassium chloride SA  40 mEq Oral Daily    Assessment/Plan PSBO, vomiting for 4 weeks Possible pnemonia UTI ARF AODM HX OF HYPERTENSION with hypotension CHF  EF 20-25% V TAch/possible apical thrombus last admit   Plan:   Low fiber diet, SBO seems resolved.  Continue to mobilize.     LOS: 6 days    Caleb Bauer 10/12/2012

## 2012-10-12 NOTE — Progress Notes (Signed)
Agree with A&P of WJ,PA. Still would get CT enterography after discharge

## 2012-10-12 NOTE — Progress Notes (Signed)
10/12/2012 patient had a six beat runs of VTach at 0910. He was asymptomatic when he was assess by Rn. 0920 vital signs were 103/70, 82, 96 RA, 18, 98.6. Dr Marissa Nestle is aware and no orders given continue to monitor. [facility] RN.

## 2012-10-13 DIAGNOSIS — I10 Essential (primary) hypertension: Secondary | ICD-10-CM

## 2012-10-13 DIAGNOSIS — I509 Heart failure, unspecified: Secondary | ICD-10-CM

## 2012-10-13 DIAGNOSIS — I5042 Chronic combined systolic (congestive) and diastolic (congestive) heart failure: Secondary | ICD-10-CM

## 2012-10-13 LAB — CULTURE, BLOOD (ROUTINE X 2): Culture: NO GROWTH

## 2012-10-13 LAB — BASIC METABOLIC PANEL
CO2: 27 mEq/L (ref 19–32)
Chloride: 106 mEq/L (ref 96–112)
Creatinine, Ser: 1.38 mg/dL — ABNORMAL HIGH (ref 0.50–1.35)
Glucose, Bld: 99 mg/dL (ref 70–99)

## 2012-10-13 LAB — GLUCOSE, CAPILLARY
Glucose-Capillary: 106 mg/dL — ABNORMAL HIGH (ref 70–99)
Glucose-Capillary: 105 mg/dL — ABNORMAL HIGH (ref 70–99)

## 2012-10-13 NOTE — Progress Notes (Signed)
Working towards Medicaid SNF placement- beds are hard to find at this time but I am searching surrounding counties for options- will update as offers are confirmed- hopeful for later today or tomorrow.  Reece Levy, MSW [phone]

## 2012-10-13 NOTE — Progress Notes (Signed)
Agree with above note. Patient feels he has no problem with his abdomen. May be going home later today. Will see again PRN

## 2012-10-13 NOTE — Progress Notes (Signed)
Subjective: Pt feels great other than his breathing which is  Improved.  Pt denies any N/V.  Having BM's and urinating normally.  Tolerating regular diet.  Working with PT/OT who recommend SNF.  Objective: Vital signs in last 24 hours: Temp:  [97.8 F (36.6 C)-98.7 F (37.1 C)] 98.3 F (36.8 C) (08/13 0409) Pulse Rate:  [81-84] 81 (08/13 0409) Resp:  [18-30] 26 (08/13 0409) BP: (109-119)/(66-75) 119/75 mmHg (08/13 0409) SpO2:  [93 %-98 %] 98 % (08/13 0409) Weight:  [218 lb 4.1 oz (99 kg)] 218 lb 4.1 oz (99 kg) (08/12 2239) Last BM Date: 10/12/12  Intake/Output from previous day: 08/12 0701 - 08/13 0700 In: 600 [P.O.:600] Out: 1275 [Urine:1275] Intake/Output this shift: Total I/O In: -  Out: 175 [Urine:175]  PE: Gen:  Alert, NAD, pleasant Card:  RRR, no M/G/R heard Pulm:  CTA, no W/R/R Abd: Soft, NT/ND, +BS, no HSM   Lab Results:   Recent Labs  10/12/12 0502  WBC 9.0  HGB 12.5*  HCT 37.9*  PLT 85*   BMET  Recent Labs  10/12/12 0502 10/13/12 0400  NA 142 139  K 4.5 4.5  CL 108 106  CO2 24 27  GLUCOSE 98 99  BUN 44* 37*  CREATININE 1.39* 1.38*  CALCIUM 8.6 8.4   PT/INR No results found for this basename: LABPROT, INR,  in the last 72 hours CMP     Component Value Date/Time   NA 139 10/13/2012 0400   K 4.5 10/13/2012 0400   CL 106 10/13/2012 0400   CO2 27 10/13/2012 0400   GLUCOSE 99 10/13/2012 0400   BUN 37* 10/13/2012 0400   CREATININE 1.38* 10/13/2012 0400   CALCIUM 8.4 10/13/2012 0400   PROT 6.3 10/08/2012 0757   ALBUMIN 1.5* 10/08/2012 0757   AST 12 10/08/2012 0757   ALT 11 10/08/2012 0757   ALKPHOS 103 10/08/2012 0757   BILITOT 0.8 10/08/2012 0757   GFRNONAA 54* 10/13/2012 0400   GFRAA 62* 10/13/2012 0400   Lipase  No results found for this basename: lipase       Studies/Results: No results found.  Anti-infectives: Anti-infectives   Start     Dose/Rate Route Frequency Ordered Stop   10/08/12 0200  vancomycin (VANCOCIN) IVPB 1000 mg/200 mL  premix  Status:  Discontinued     1,000 mg 200 mL/hr over 60 Minutes Intravenous Every 24 hours 10/07/12 1118 10/08/12 1851   10/07/12 2200  ceFEPIme (MAXIPIME) 1 g in dextrose 5 % 50 mL IVPB     1 g 100 mL/hr over 30 Minutes Intravenous Every 24 hours 10/07/12 0335 10/10/12 2331   10/07/12 0030  vancomycin (VANCOCIN) 1,750 mg in sodium chloride 0.9 % 500 mL IVPB     1,750 mg 250 mL/hr over 120 Minutes Intravenous  Once 10/07/12 0018 10/07/12 0352   10/07/12 0015  ceFEPIme (MAXIPIME) 1 g in dextrose 5 % 50 mL IVPB     1 g 100 mL/hr over 30 Minutes Intravenous  Once 10/07/12 0004 10/07/12 0151       Assessment/Plan PSBO, vomiting for 4 weeks  Possible pneumonia  UTI  ARF  AODM  HX OF HYPERTENSION with hypotension  CHF EF 20-25%  V TAch/possible apical thrombus last admit   Plan:  1.  Low fiber diet, SBO seems resolved 2.  Continue to mobilize 3.  Recommend CT enterography after discharge 4.  Discharge per primary service when medically stable and SNF bed available, will sign off, call with questions  concerns     LOS: 7 days    DORT, Verena Shawgo 10/13/2012, 8:03 AM Pager: [phone]

## 2012-10-13 NOTE — Discharge Summary (Addendum)
[facility] PROGRESS NOTE  Caleb Bauer ZOX:[id number] DOB: 1950-05-15 DOA: 10/06/2012 PCP: Pcp Not In System  Assessment/Plan: 1. SBO; resolved no abdominal pain today 2. HCAP; resolved 3. Acute kidney failure; patient has a creatinine = 1.38 and is making urine 4. DM; hemoglobin A1c 09/13/2002 to= 6.2 (within ADA guidelines) 5.  Hypotension; resolved, BP within AHA guidelines continue meds unchanged  6.  Followup; emphasize to patient the need to obtain a PCP    Code Status: Full Family Communication:  Disposition Plan: Awaiting SNF  Consultants: Surgery   HPI/Subjective: Caleb Bauer is a 62 y.o. male who presents to the ED with c/o N/V for the past 4 weeks. He reports he vomits every day, usually before eating, after vomiting he feels better and is able to eat. Patient seems like a poor historian. Per patients niece, he has not been receiving good care at the SNF he has been in since his DC from [facility] on 7/28 and so she brought the patient in. She is worried that he is developing infection in his leg again.  In the ED the niece's worries that the patients health may be deteriorating since his discharge are indeed substantiated: he has an HCAP vs aspiration PNA, is slightly hypotensive, there is trace blood in his vomit (although thankfully HGB is 17.2 so not at all anemic), he is severely dehydrated, in acute kidney failure and slightly hyperkalemic. Hospitalist is admitting patient. Today states negative nausea negative vomiting or negative dizziness overnight       Objective: Filed Vitals:   10/12/12 1319 10/12/12 1611 10/12/12 2239 10/13/12 0409  BP: 115/66 109/70 119/68 119/75  Pulse: 82 81 81 81  Temp: 97.8 F (36.6 C) 98.2 F (36.8 C) 98 F (36.7 C) 98.3 F (36.8 C)  TempSrc: Oral Oral Oral Oral  Resp: 19 18 30 26   Height:      Weight:   99 kg (218 lb 4.1 oz)   SpO2: 93% 97% 94% 98%    Intake/Output Summary (Last 24 hours) at 10/13/12 0833 Last data filed  at 10/13/12 0720  Gross per 24 hour  Intake    600 ml  Output   1450 ml  Net   -850 ml   Filed Weights   10/10/12 2020 10/11/12 2200 10/12/12 2239  Weight: 97 kg (213 lb 13.5 oz) 98.9 kg (218 lb 0.6 oz) 99 kg (218 lb 4.1 oz)    Exam:   General: Alert, NAD  Cardiovascular: Regular rhythm and rate, positive systolic murmur grade 2/4 (chronic issues), negative murmurs rubs gallops, PT/DP pulses 2+ bilateral  Respiratory: Good auscultation bilateral  Abdomen: Soft nontender nondistended plus bowel sounds  Musculoskeletal: Left lower extremity swelling (patient said at struck on his bed prior to admission)   Data Reviewed: Basic Metabolic Panel:  Recent Labs Lab 10/07/12 0930 10/07/12 0934  10/09/12 0610 10/10/12 0522 10/11/12 0635 10/12/12 0502 10/13/12 0400  NA 139 140  < > 145 151* 145 142 139  K 4.4 5.0  < > 3.4* 3.5 4.4 4.5 4.5  CL 98 96  < > 108 112 112 108 106  CO2 23 25  < > 23 29 24 24 27   GLUCOSE 115* 117*  < > 95 117* 91 98 99  BUN 88* 90*  < > 82* 71* 54* 44* 37*  CREATININE 4.13* 4.06*  < > 2.71* 2.02* 1.48* 1.39* 1.38*  CALCIUM 8.9 8.9  < > 8.2* 9.0 8.7 8.6 8.4  MG  --  2.0  --   --   --   --   --   --   PHOS 6.6*  --   --   --   --   --   --   --   < > = values in this interval not displayed. Liver Function Tests:  Recent Labs Lab 10/06/12 2305 10/07/12 0930 10/08/12 0757  AST 22  --  12  ALT 20  --  11  ALKPHOS 107  --  103  BILITOT 1.6*  --  0.8  PROT 8.4*  --  6.3  ALBUMIN 2.3* 1.9* 1.5*   No results found for this basename: LIPASE, AMYLASE,  in the last 168 hours No results found for this basename: AMMONIA,  in the last 168 hours CBC:  Recent Labs Lab 10/06/12 2305 10/07/12 0934 10/08/12 0757 10/09/12 0610 10/12/12 0502  WBC 8.3 8.4 9.8 9.0 9.0  NEUTROABS 5.4  --   --   --   --   HGB 17.2* 16.0 13.7 12.7* 12.5*  HCT 50.6 46.9 40.1 38.3* 37.9*  MCV 76.1* 76.1* 76.4* 76.9* 78.0  PLT 129* 122* 90* 72* 85*   Cardiac Enzymes: No  results found for this basename: CKTOTAL, CKMB, CKMBINDEX, TROPONINI,  in the last 168 hours BNP (last 3 results)  Recent Labs  09/25/12 0450 09/26/12 0530 10/06/12 2305  PROBNP 16109.6* 34516.0* 33980.0*   CBG:  Recent Labs Lab 10/12/12 0800 10/12/12 1200 10/12/12 1745 10/12/12 2155 10/13/12 0807  GLUCAP 89 111* 91 106* 90    Recent Results (from the past 240 hour(s))  CULTURE, BLOOD (ROUTINE X 2)     Status: None   Collection Time    10/07/12 12:20 AM      Result Value Range Status   Specimen Description BLOOD LEFT ARM IV START   Final   Special Requests BOTTLES DRAWN AEROBIC ONLY 10CC   Final   Culture  Setup Time     Final   Value: 10/07/2012 04:30     Performed at [facility]   Culture     Final   Value: NO GROWTH 5 DAYS     Performed at [facility]   Report Status 10/13/2012 FINAL   Final  CULTURE, BLOOD (ROUTINE X 2)     Status: None   Collection Time    10/07/12 12:27 AM      Result Value Range Status   Specimen Description BLOOD RIGHT HAND   Final   Special Requests BOTTLES DRAWN AEROBIC ONLY 2CC   Final   Culture  Setup Time     Final   Value: 10/07/2012 04:30     Performed at [facility]   Culture     Final   Value: NO GROWTH 5 DAYS     Performed at [facility]   Report Status 10/13/2012 FINAL   Final  MRSA PCR SCREENING     Status: None   Collection Time    10/07/12  2:09 AM      Result Value Range Status   MRSA by PCR NEGATIVE  NEGATIVE Final   Comment:            The GeneXpert MRSA Assay (FDA     approved for NASAL specimens     only), is one component of a     comprehensive MRSA colonization     surveillance program. It is not     intended to diagnose MRSA  infection nor to guide or     monitor treatment for     MRSA infections.  CULTURE, EXPECTORATED SPUTUM-ASSESSMENT     Status: None   Collection Time    10/07/12  8:45 AM      Result Value Range Status   Specimen Description SPUTUM   Final    Special Requests NONE   Final   Sputum evaluation     Final   Value: MICROSCOPIC FINDINGS SUGGEST THAT THIS SPECIMEN IS NOT REPRESENTATIVE OF LOWER RESPIRATORY SECRETIONS. PLEASE RECOLLECT.     Gram Stain Report Called to,Read Back By and Verified With: Corine Shelter RN 10:15 10/07/12 ([email or web address])   Report Status 10/07/2012 FINAL   Final  CULTURE, EXPECTORATED SPUTUM-ASSESSMENT     Status: None   Collection Time    10/07/12 11:44 AM      Result Value Range Status   Specimen Description SPUTUM   Final   Special Requests Normal   Final   Sputum evaluation     Final   Value: THIS SPECIMEN IS ACCEPTABLE. RESPIRATORY CULTURE REPORT TO FOLLOW.   Report Status 10/07/2012 FINAL   Final  CULTURE, RESPIRATORY (NON-EXPECTORATED)     Status: None   Collection Time    10/07/12 11:44 AM      Result Value Range Status   Specimen Description SPUTUM   Final   Special Requests NONE   Final   Gram Stain     Final   Value: MODERATE WBC PRESENT,BOTH PMN AND MONONUCLEAR     RARE SQUAMOUS EPITHELIAL CELLS PRESENT     MODERATE GRAM VARIABLE ROD     FEW GRAM POSITIVE COCCI     IN PAIRS   Culture     Final   Value: NORMAL OROPHARYNGEAL FLORA     Performed at [facility]   Report Status 10/09/2012 FINAL   Final  URINE CULTURE     Status: None   Collection Time    10/07/12  4:24 PM      Result Value Range Status   Specimen Description URINE, CATHETERIZED   Final   Special Requests NONE   Final   Culture  Setup Time     Final   Value: 10/07/2012 19:00     Performed at [facility] Count     Final   Value: 80,000 COLONIES/ML     Performed at [facility]   Culture     Final   Value: ENTEROBACTER AEROGENES     Performed at [facility]   Report Status 10/09/2012 FINAL   Final   Organism ID, Bacteria ENTEROBACTER AEROGENES   Final  URINE CULTURE     Status: None   Collection Time    10/11/12  2:49 AM      Result Value Range Status   Specimen Description URINE,  RANDOM   Final   Special Requests NONE   Final   Culture  Setup Time     Final   Value: 10/11/2012 09:52     Performed at [facility] Count     Final   Value: NO GROWTH     Performed at [facility]   Culture     Final   Value: NO GROWTH     Performed at [facility]   Report Status 10/12/2012 FINAL   Final     Studies: No results found.  Scheduled Meds: . amiodarone  200 mg Oral  Daily  . aspirin EC  81 mg Oral Daily  . digoxin  0.125 mg Oral Daily  . isosorbide-hydrALAZINE  1 tablet Oral TID  . metoprolol tartrate  12.5 mg Oral BID  . pantoprazole  80 mg Oral Daily  . potassium chloride SA  40 mEq Oral Daily   Continuous Infusions:   Principal Problem:   HCAP (healthcare-associated pneumonia) Active Problems:   Diabetes mellitus without complication   Hypotension, unspecified   Nausea and vomiting   Acute kidney failure   Small bowel obstruction    Time spent: 40 minutes    WOODS, CURTIS, J  [facility] Pager [phone]. If 7PM-7AM, please contact night-coverage at [email or web address], password [facility] 10/13/2012, 8:33 AM  LOS: 7 days

## 2012-10-13 NOTE — Progress Notes (Signed)
Physical Therapy Treatment Patient Details Name: HASKEL DEWALT MRN: [id number] DOB: [birth year] Today's Date: 10/13/2012 Time: 4098-1191 PT Time Calculation (min): 23 min  PT Assessment / Plan / Recommendation  History of Present Illness 62 y/o male with DM2, CHF was admitted on 8/6 with emesis and found to have a SBO and PNA. His last admission was on 7/11 for cellulitis of his left leg.  On 7/12 he developed respiratory failure and acute encephalopathy requiring mechanical ventilation. He developed hypotension that was felt to be due to sepsis-PCT was ~ 2 and all cultures have been negative. Subsequent ECHO revealed severe LV systolic dysfunction with ejection fraction of 20-25% with global hypokinesis and markedly dilated left ventricle. Also question of apical mural thrombus. It was surmised he likely had cardiogenic shock. Pt continues to have significant edema. He was D/C'd to a SNF.   PT Comments   Pt. Making gradual gains with PT.  He worked hard in his session today, and pain did not interfere as much as last session.  Pt. And therapist pleased with his progress.  Still needs post acute inpatient rehab at SNF to maximize his strength and mobility.  Follow Up Recommendations  SNF     Does the patient have the potential to tolerate intense rehabilitation     Barriers to Discharge        Equipment Recommendations  None recommended by PT    Recommendations for Other Services OT consult  Frequency Min 3X/week   Progress towards PT Goals Progress towards PT goals: Progressing toward goals  Plan Current plan remains appropriate    Precautions / Restrictions Precautions Precautions: Fall Precaution Comments: monitor RA sats Restrictions Weight Bearing Restrictions: No   Pertinent Vitals/Pain See vitals tab     Mobility  Bed Mobility Bed Mobility: Rolling Left;Left Sidelying to Sit Rolling Left: 4: Min assist;With rail Left Sidelying to Sit: 3: Mod assist Sitting - Scoot to  Edge of Bed: 5: Supervision Details for Bed Mobility Assistance: cues for technique and mod assist to elevate shoulders to sitting position Transfers Transfers: Sit to Stand;Stand to Sit Sit to Stand: 1: +2 Total assist;From bed;With upper extremity assist Sit to Stand: Patient Percentage: 40% Stand to Sit: 1: +2 Total assist;To chair/3-in-1;With armrests Stand to Sit: Patient Percentage: 60% Details for Transfer Assistance: pt. with increased effort needed to rise to stand with 2 assist; cues for techniuqe and safety Ambulation/Gait Ambulation/Gait Assistance: 1: +2 Total assist Ambulation/Gait: Patient Percentage: 60% Ambulation Distance (Feet): 3 Feet Assistive device: Rolling walker Ambulation/Gait Assistance Details: Pt. able to complete 3 turning steps to recliner with manual facilitation for weight shift to either side to unweight opposite leg.  Process difficult for pt. and required increased effort on his part Gait Pattern: Decreased stride length Stairs: No    Exercises General Exercises - Lower Extremity Ankle Circles/Pumps: AROM;Both;10 reps Quad Sets: AROM;Both;10 reps Gluteal Sets: AROM;Both;10 reps Short Arc Quad: AROM;Both;10 reps Hip Flexion/Marching: AROM;Both;10 reps   PT Diagnosis:    PT Problem List:   PT Treatment Interventions:     PT Goals (current goals can now be found in the care plan section) Acute Rehab PT Goals Patient Stated Goal: rehab and then home  Visit Information  Last PT Received On: 10/13/12 Assistance Needed: +2 History of Present Illness: 62 y/o male with DM2, CHF was admitted on 8/6 with emesis and found to have a SBO and PNA. His last admission was on 7/11 for cellulitis of his left leg.  On 7/12 he developed respiratory failure and acute encephalopathy requiring mechanical ventilation. He developed hypotension that was felt to be due to sepsis-PCT was ~ 2 and all cultures have been negative. Subsequent ECHO revealed severe LV systolic  dysfunction with ejection fraction of 20-25% with global hypokinesis and markedly dilated left ventricle. Also question of apical mural thrombus. It was surmised he likely had cardiogenic shock. Pt continues to have significant edema. He was D/C'd to a SNF.    Subjective Data  Subjective: Pt. in bed comfortably resting Patient Stated Goal: rehab and then home   Cognition  Cognition Arousal/Alertness: Awake/alert Behavior During Therapy: WFL for tasks assessed/performed Overall Cognitive Status: Within Functional Limits for tasks assessed    Balance     End of Session PT - End of Session Equipment Utilized During Treatment: Gait belt Activity Tolerance: Patient tolerated treatment well;Patient limited by fatigue Patient left: in chair;with call bell/phone within reach Nurse Communication: Mobility status;Need for lift equipment   GP     Ferman Hamming 10/13/2012, 12:57 PM Weldon Picking PT Acute Rehab Services [phone] Beeper [phone]

## 2012-10-14 LAB — GLUCOSE, CAPILLARY: Glucose-Capillary: 84 mg/dL (ref 70–99)

## 2012-10-14 LAB — DIGOXIN LEVEL: Digoxin Level: 1.1 ng/mL (ref 0.8–2.0)

## 2012-10-14 MED ORDER — CIPROFLOXACIN HCL 250 MG PO TABS: 250.0000 mg | ORAL_TABLET | Freq: Two times a day (BID) | ORAL | Status: DC

## 2012-10-14 NOTE — Progress Notes (Signed)
10/14/2012 3:57 PM  Report called to Herbert Seta at [facility] [place].  Pt belongings packed and sent with patient, who traveled by EMS.   Theadora Rama

## 2012-10-14 NOTE — Progress Notes (Addendum)
Family has made arrangements for SNF bed just within the last hour at [facility]. I have contacted [facility] and Rehab to cancel this arrangement and am planning with [facility] for SNF. Plan transfer via EMS this afternoon.  Reece Levy, MSW [phone]

## 2012-10-15 ENCOUNTER — Non-Acute Institutional Stay (SKILLED_NURSING_FACILITY): Payer: Medicare Other | Admitting: Internal Medicine

## 2012-10-15 ENCOUNTER — Encounter: Payer: Self-pay | Admitting: Internal Medicine

## 2012-10-15 DIAGNOSIS — K56609 Unspecified intestinal obstruction, unspecified as to partial versus complete obstruction: Secondary | ICD-10-CM

## 2012-10-15 DIAGNOSIS — I472 Ventricular tachycardia: Secondary | ICD-10-CM

## 2012-10-15 DIAGNOSIS — E119 Type 2 diabetes mellitus without complications: Secondary | ICD-10-CM

## 2012-10-15 DIAGNOSIS — J189 Pneumonia, unspecified organism: Secondary | ICD-10-CM

## 2012-10-15 NOTE — Progress Notes (Signed)
MRN: [id number] Name: Caleb Bauer  Sex: male Age: 62 y.o. DOB: 07-22-1950  PSC #: Ronni Rumble Facility/Room:  132 Level Of Care: SNF Provider: Merrilee Seashore D Emergency Contacts: Extended Emergency Contact Information Primary Emergency Contact: Simmons,Beatrice Address: [address]          [place], [place] [address] [place] Home Phone: [phone] Relation: Mother Secondary Emergency Contact: Kathe Mariner [place] Mobile Phone: [phone] Relation: Niece  Code Status: FULL  Allergies: Other  Chief Complaint  Patient presents with  . nursing hoime admission    HPI: Patient is 62 y.o. male who was in hosp with SBO and complications thereof, resolved, and is admitted to SNF with his usual chronic problems.  Past Medical History  Diagnosis Date  . Diabetes mellitus without complication   . CHF (congestive heart failure)   . Hypertension   . Cardiomyopathy, dilated   . Small bowel obstruction 10/2012  . COPD (chronic obstructive pulmonary disease)   . Herpes     BUTT AND BACK- 9/23 HEALED  . Atrial flutter by electrocardiogram 11/2012  . GERD (gastroesophageal reflux disease)   . Gout     Past Surgical History  Procedure Laterality Date  . Laparoscopic incisional / umbilical / ventral hernia repair        Medication List       This list is accurate as of: 10/15/12 11:59 PM.  Always use your most recent med list.               amiodarone 200 MG tablet  Commonly known as:  PACERONE  Take 1 tablet (200 mg total) by mouth daily.     aspirin EC 81 MG tablet  Take 81 mg by mouth daily.     cholecalciferol 1000 UNITS tablet  Commonly known as:  VITAMIN D  Take 1,000 Units by mouth daily.     digoxin 0.125 MG tablet  Commonly known as:  LANOXIN  Take 1 tablet (0.125 mg total) by mouth daily.     ipratropium-albuterol 0.5-2.5 (3) MG/3ML Soln  Commonly known as:  DUONEB  Take 3 mLs by nebulization 4 (four)  times daily.     ipratropium-albuterol 0.5-2.5 (3) MG/3ML Soln  Commonly known as:  DUONEB  Take 3 mLs by nebulization every 3 (three) hours as needed (for wheezing /shortness of breath).     isosorbide-hydrALAZINE 20-37.5 MG per tablet  Commonly known as:  BIDIL  Take 1 tablet by mouth 3 (three) times daily.     metoprolol tartrate 12.5 mg Tabs tablet  Commonly known as:  LOPRESSOR  Take 0.5 tablets (12.5 mg total) by mouth 2 (two) times daily.     multivitamin with minerals Tabs tablet  Take 2 tablets by mouth daily.     omeprazole 40 MG capsule  Commonly known as:  PRILOSEC  Take 40 mg by mouth daily.     potassium chloride SA 20 MEQ tablet  Commonly known as:  K-DUR,KLOR-CON  Take 40 mEq by mouth daily.     torsemide 20 MG tablet  Commonly known as:  DEMADEX  Take 2 tablets (40 mg total) by mouth 2 (two) times daily.        No orders of the defined types were placed in this encounter.    Immunization History  Administered Date(s) Administered  . Influenza,inj,Quad PF,36+ Mos 12/07/2012  . Pneumococcal Polysaccharide-23 12/07/2012    History  Substance Use Topics  . Smoking status:  Former Smoker -- 0.25 packs/day for .5 years    Types: Cigarettes    Quit date: 09/28/2012  . Smokeless tobacco: Never Used  . Alcohol Use: No    Family history is noncontributory    Review of Systems  DATA OBTAINED: from patient,  GENERAL: Feels well no fevers, fatigue, appetite changes SKIN: No itching, rash ;no skin infections EYES: No eye pain, redness, discharge EARS: No earache, tinnitus, change in hearing NOSE: No congestion, drainage or bleeding  MOUTH/THROAT: No mouth or tooth pain, No sore throat, No difficulty chewing or swallowing  RESPIRATORY: No cough, wheezing, SOB CARDIAC: No chest pain, palpitations, lower extremity edema  GI: No abdominal pain, No N/V/D or constipation, No heartburn or reflux  GU: No dysuria, frequency or urgency, or incontinence   MUSCULOSKELETAL: No unrelieved bone/joint pain NEUROLOGIC: Awake, alert, appropriate to situation, No change in mental status. Moves all four, no focal deficits PSYCHIATRIC: No overt anxiety or sadness. Sleeps well. No behavior issue.     Filed Vitals:   10/15/12 1548  BP: 160/89  Pulse: 77  Temp: 98 F (36.7 C)  Resp: 20    Physical Exam  GENERAL APPEARANCE: Alert, conversant. Appropriately groomed. No acute distress.  SKIN: No diaphoresis rash, or wounds HEAD: Normocephalic, atraumatic  EYES: Conjunctiva/lids clear. Pupils round, reactive. EOMs intact.  EARS: External exam WNL, canals clear. Hearing grossly normal.  NOSE: No deformity or discharge.  MOUTH/THROAT: Lips w/o lesions.  RESPIRATORY: Breathing is even, unlabored. Lung sounds are clear   CARDIOVASCULAR: Heart RRR with gallop 1+ peripheral edema BLE, trace edema R arm and hand and some bruising along what appears to be IV site/vein VENOUS: + venous stasis skin changes BLE GASTROINTESTINAL: Abdomen is soft, non-tender, not distended w/ normal bowel sounds. No mass, ventral or inguinal hernia. No organomegally GENITOURINARY: Bladder non tender, not distended  MUSCULOSKELETAL: No abnormal joints or musculature NEUROLOGIC: Oriented X3. Cranial nerves 2-12 grossly intact. Moves all extremities no tremor. PSYCHIATRIC:,pt is refusing labs  Patient Active Problem List   Diagnosis Date Noted  . Acute respiratory failure 03/10/2013  . Sepsis due to undetermined organism with acute respiratory failure 03/10/2013  . Gout   . Iron deficiency anemia 12/14/2012  . Protein-calorie malnutrition, severe 12/10/2012  . GERD (gastroesophageal reflux disease)   . Nausea and vomiting 12/06/2012  . Atrial flutter by electrocardiogram   . Physical deconditioning 11/30/2012  . Hypotension 11/25/2012  . Dehydration 11/25/2012  . CKD (chronic kidney disease) stage 3, GFR 30-59 ml/min 11/25/2012  . Diarrhea 11/25/2012  . UTI (lower  urinary tract infection) 11/25/2012  . Cardiogenic shock 11/25/2012  . Digoxin toxicity 11/23/2012  . Acute renal failure 11/23/2012  . COPD (chronic obstructive pulmonary disease)   . History of ventricular tachycardia 09/17/2012  . Abnormal transaminases 09/17/2012  . Thrombocytopenia, unspecified 09/11/2012  . Acute on chronic combined systolic and diastolic congestive heart failure, NYHA class 4   . Hypertension   . Diabetes mellitus without complication     CBC    Component Value Date/Time   WBC 3.5* 03/13/2013 0411   RBC 3.42* 03/13/2013 0411   HGB 9.0* 03/13/2013 0411   HCT 28.5* 03/13/2013 0411   PLT 134* 03/13/2013 0411   MCV 83.3 03/13/2013 0411   LYMPHSABS 1.0 03/09/2013 2228   MONOABS 1.2* 03/09/2013 2228   EOSABS 0.0 03/09/2013 2228   BASOSABS 0.0 03/09/2013 2228    CMP     Component Value Date/Time   NA 145 03/13/2013 0411  K 4.2 03/13/2013 0411   CL 109 03/13/2013 0411   CO2 26 03/13/2013 0411   GLUCOSE 127* 03/13/2013 0411   BUN 20 03/13/2013 0411   CREATININE 1.24 03/13/2013 0411   CALCIUM 8.1* 03/13/2013 0411   PROT 6.7 03/12/2013 0342   ALBUMIN 2.1* 03/12/2013 0342   AST 265* 03/12/2013 0342   ALT 273* 03/12/2013 0342   ALKPHOS 104 03/12/2013 0342   BILITOT 0.6 03/12/2013 0342   GFRNONAA 61* 03/13/2013 0411   GFRAA 70* 03/13/2013 0411    Assessment and Plan  Small bowel obstruction Resolved prior to hospital d/c  HCAP (healthcare-associated pneumonia) Resolved prior to hospital d/c  Diabetes mellitus without complication Currently stable on diet-last HBA1c 09/12/2012 - 6.2  History of ventricular tachycardia H/o-continue meds for same  Cellulitis of extremity Pt has an area on his leg that gets infected from time to time but is not infected now    Margit Hanks, MD

## 2012-10-15 NOTE — Assessment & Plan Note (Signed)
Currently stable on diet-last HBA1c 09/12/2012 - 6.2

## 2012-10-15 NOTE — Assessment & Plan Note (Signed)
Resolved prior to hospital d/c

## 2012-10-15 NOTE — Assessment & Plan Note (Signed)
Resolved prior to hospital d/c 

## 2012-10-15 NOTE — Assessment & Plan Note (Signed)
Pt has an area on his leg that gets infected from time to time but is not infected now

## 2012-10-15 NOTE — Assessment & Plan Note (Signed)
H/o-continue meds for same

## 2012-10-20 ENCOUNTER — Encounter: Payer: Self-pay | Admitting: Internal Medicine

## 2012-10-20 ENCOUNTER — Non-Acute Institutional Stay (SKILLED_NURSING_FACILITY): Payer: Medicare Other | Admitting: Internal Medicine

## 2012-10-20 DIAGNOSIS — M109 Gout, unspecified: Secondary | ICD-10-CM

## 2012-10-20 MED ORDER — ALLOPURINOL 100 MG PO TABS: 100.0000 mg | ORAL_TABLET | Freq: Every day | ORAL | Status: DC

## 2012-10-20 MED ORDER — COLCHICINE 0.6 MG PO TABS: 0.6000 mg | ORAL_TABLET | Freq: Every day | ORAL | Status: DC

## 2012-10-20 NOTE — Progress Notes (Signed)
Patient ID: Caleb Bauer, male   DOB: 04/26/1950, 62 y.o.   MRN: [id number] Location:  [facility] Provider:  Gwenith Spitz. Renato Gails, D.O., C.M.D.  Code Status:  Full code  Chief Complaint  Patient presents with  . Acute Visit    warm painful right forearm    HPI:   62 yo male with h/o DMII, CHF, HTN, dilated cardiomyopathy, and hospitalization thru 10/14/12 with small bowel obstruction here for short term rehab was seen for acute visit due to right forearm erythema, warmth and pain.  He notes this has been present for several days, and it is worst in his second digit MCP area.  He has a h/o gout, as well, but had run out of his allopurinol (or uloric) so he stopped it.    Review of Systems:  Review of Systems  Constitutional: Negative for fever and chills.  Respiratory: Negative for shortness of breath.   Cardiovascular: Negative for chest pain.  Musculoskeletal: Positive for joint pain.       Swelling, redness, warmth, decreased hand grip on right, left normal  Skin:       Has sacral decubitus followed by wound care team    Medications: Patient's Medications  New Prescriptions   No medications on file  Previous Medications   AMIODARONE (PACERONE) 200 MG TABLET    Take 1 tablet (200 mg total) by mouth daily.   ASPIRIN EC 81 MG TABLET    Take 81 mg by mouth daily.   CHOLECALCIFEROL (VITAMIN D) 1000 UNITS TABLET    Take 1,000 Units by mouth daily.   DIGOXIN (LANOXIN) 0.125 MG TABLET    Take 1 tablet (0.125 mg total) by mouth daily.   IPRATROPIUM-ALBUTEROL (DUONEB) 0.5-2.5 (3) MG/3ML SOLN    Take 3 mLs by nebulization 4 (four) times daily.   IPRATROPIUM-ALBUTEROL (DUONEB) 0.5-2.5 (3) MG/3ML SOLN    Take 3 mLs by nebulization every 3 (three) hours as needed (for wheezing /shortness of breath).   ISOSORBIDE-HYDRALAZINE (BIDIL) 20-37.5 MG PER TABLET    Take 1 tablet by mouth 3 (three) times daily.   METOPROLOL TARTRATE (LOPRESSOR) 12.5 MG TABS    Take 0.5 tablets (12.5 mg  total) by mouth 2 (two) times daily.   MULTIPLE VITAMIN (MULTIVITAMIN WITH MINERALS) TABS TABLET    Take 1 tablet by mouth daily.   OMEPRAZOLE (PRILOSEC) 40 MG CAPSULE    Take 40 mg by mouth daily.   POTASSIUM CHLORIDE SA (K-DUR,KLOR-CON) 20 MEQ TABLET    Take 40 mEq by mouth daily.   TORSEMIDE (DEMADEX) 20 MG TABLET    Take 2 tablets (40 mg total) by mouth 2 (two) times daily.  Modified Medications   No medications on file  Discontinued Medications   No medications on file    Physical Exam: There were no vitals filed for this visit. Physical Exam  Constitutional: He is oriented to person, place, and time. He appears well-developed and well-nourished. No distress.  HENT:  Head: Normocephalic and atraumatic.  Cardiovascular: Normal rate, regular rhythm and normal heart sounds.   Pulmonary/Chest: Effort normal and breath sounds normal.  Musculoskeletal: He exhibits edema and tenderness.  Right hand with erythema, warmth, tenderness, edema over right second digit MCP joint and through hand  Neurological: He is alert and oriented to person, place, and time.  Skin: Skin is warm and dry. There is erythema.    Labs reviewed: Basic Metabolic Panel:  Recent Labs  24/40/10 1800  09/16/12 0310  09/21/12  0981 09/22/12 0500  10/07/12 0930 10/07/12 0934  10/11/12 0635 10/12/12 0502 10/13/12 0400  NA  --   < > 142  < > 139 140  < > 139 140  < > 145 142 139  K  --   < > 3.7  < > 4.5 4.7  < > 4.4 5.0  < > 4.4 4.5 4.5  CL  --   < > 103  < > 99 100  < > 98 96  < > 112 108 106  CO2  --   < > 36*  < > 31 33*  < > 23 25  < > 24 24 27   GLUCOSE  --   < > 105*  < > 90 118*  < > 115* 117*  < > 91 98 99  BUN  --   < > 31*  < > 52* 48*  < > 88* 90*  < > 54* 44* 37*  CREATININE  --   < > 0.98  < > 1.99* 2.10*  < > 4.13* 4.06*  < > 1.48* 1.39* 1.38*  CALCIUM  --   < > 8.0*  < > 8.6 8.8  < > 8.9 8.9  < > 8.7 8.6 8.4  MG 2.3  --  2.2  --   --   --   --   --  2.0  --   --   --   --   PHOS  --   --   2.9  < > 3.7 3.3  --  6.6*  --   --   --   --   --   < > = values in this interval not displayed.  Liver Function Tests:  Recent Labs  09/23/12 0450 10/06/12 2305 10/07/12 0930 10/08/12 0757  AST 20 22  --  12  ALT 30 20  --  11  ALKPHOS 85 107  --  103  BILITOT 1.3* 1.6*  --  0.8  PROT 6.0 8.4*  --  6.3  ALBUMIN 1.7* 2.3* 1.9* 1.5*    CBC:  Recent Labs  09/10/12 1710  09/15/12 1059  10/06/12 2305  10/08/12 0757 10/09/12 0610 10/12/12 0502  WBC 7.2  < > 12.2*  < > 8.3  < > 9.8 9.0 9.0  NEUTROABS 5.9  --  10.6*  --  5.4  --   --   --   --   HGB 16.5  < > 14.8  < > 17.2*  < > 13.7 12.7* 12.5*  HCT 48.7  < > 43.0  < > 50.6  < > 40.1 38.3* 37.9*  MCV 78.2  < > 77.6*  < > 76.1*  < > 76.4* 76.9* 78.0  PLT 52*  < > 48*  < > 129*  < > 90* 72* 85*  < > = values in this interval not displayed.  Significant Diagnostic Results: Doppler preliminarily negative of right forearm  Assessment/Plan 1. Gout attack --renal function reviewed--pt with good po intake --check uric acid level in the am --begin colcrys for a 6 month course and resume allopurinol to prevent recurrence  Family/ staff Communication: advised nursing staff that I suspect gout during stand down Goals of care: full code Labs/tests ordered:  Uric acid level in am

## 2012-10-26 ENCOUNTER — Non-Acute Institutional Stay (SKILLED_NURSING_FACILITY): Payer: Medicare Other | Admitting: Internal Medicine

## 2012-10-26 ENCOUNTER — Encounter: Payer: Self-pay | Admitting: Internal Medicine

## 2012-10-26 DIAGNOSIS — B029 Zoster without complications: Secondary | ICD-10-CM

## 2012-10-26 DIAGNOSIS — E46 Unspecified protein-calorie malnutrition: Secondary | ICD-10-CM | POA: Insufficient documentation

## 2012-10-26 NOTE — Assessment & Plan Note (Signed)
Rash on L side low back, with sx of itching and pain radiating to front appears to be zoster;pt has old scars on L upper back , just one sided , that looks like old outbreak;will start acyclovir 800mg  5X a day for 7 days.

## 2012-10-26 NOTE — Progress Notes (Signed)
MRN: [id number] Name: Caleb Bauer  Sex: male Age: 62 y.o. DOB: 1950-05-05  PSC #: Ronni Rumble Facility/Room:132 b Level Of Care: SNF Provider: Merrilee Seashore D Emergency Contacts: Extended Emergency Contact Information Primary Emergency Contact: Simmons,Beatrice Address: [address]          [place], [place] [address] [place] of [place] Home Phone: [phone] Relation: Mother    Allergies: Other  Chief Complaint  Patient presents with  . Acute Visit    HPI: Patient is 62 y.o. male who has 2 problems # 1 new onset rash  #2 eating only 40% of meals.  Past Medical History  Diagnosis Date  . Diabetes mellitus without complication   . CHF (congestive heart failure)   . Hypertension   . Cardiomyopathy, dilated   . Small bowel obstruction 10/2012    Past Surgical History  Procedure Laterality Date  . Laparoscopic incisional / umbilical / ventral hernia repair        Medication List       This list is accurate as of: 10/26/12  1:08 PM.  Always use your most recent med list.               allopurinol 100 MG tablet  Commonly known as:  ZYLOPRIM  Take 1 tablet (100 mg total) by mouth daily.     amiodarone 200 MG tablet  Commonly known as:  PACERONE  Take 1 tablet (200 mg total) by mouth daily.     aspirin EC 81 MG tablet  Take 81 mg by mouth daily.     cholecalciferol 1000 UNITS tablet  Commonly known as:  VITAMIN D  Take 1,000 Units by mouth daily.     colchicine 0.6 MG tablet  Take 1 tablet (0.6 mg total) by mouth daily.     digoxin 0.125 MG tablet  Commonly known as:  LANOXIN  Take 1 tablet (0.125 mg total) by mouth daily.     ipratropium-albuterol 0.5-2.5 (3) MG/3ML Soln  Commonly known as:  DUONEB  Take 3 mLs by nebulization 4 (four) times daily.     ipratropium-albuterol 0.5-2.5 (3) MG/3ML Soln  Commonly known as:  DUONEB  Take 3 mLs by nebulization every 3 (three) hours as needed (for wheezing /shortness of breath).      isosorbide-hydrALAZINE 20-37.5 MG per tablet  Commonly known as:  BIDIL  Take 1 tablet by mouth 3 (three) times daily.     metoprolol tartrate 12.5 mg Tabs tablet  Commonly known as:  LOPRESSOR  Take 0.5 tablets (12.5 mg total) by mouth 2 (two) times daily.     multivitamin with minerals Tabs tablet  Take 1 tablet by mouth daily.     omeprazole 40 MG capsule  Commonly known as:  PRILOSEC  Take 40 mg by mouth daily.     potassium chloride SA 20 MEQ tablet  Commonly known as:  K-DUR,KLOR-CON  Take 40 mEq by mouth daily.     torsemide 20 MG tablet  Commonly known as:  DEMADEX  Take 2 tablets (40 mg total) by mouth 2 (two) times daily.        No orders of the defined types were placed in this encounter.     There is no immunization history on file for this patient.  History  Substance Use Topics  . Smoking status: Former Smoker -- 0.25 packs/day for .5 years    Types: Cigarettes    Quit date: 09/28/2012  . Smokeless tobacco: Not on  file  . Alcohol Use: No       Review of Systems  DATA OBTAINED: from patient, nurse Pt c/o itching on L lower back;radiates around to that front- little intermident flashes  Filed Vitals:   10/26/12 1253  BP: 110/64  Pulse: 78  Temp: 96.5 F (35.8 C)  Resp: 18    Physical Exam  GENERAL APPEARANCE: Alert,. Appropriately groomed. No acute distress.  SKIN: L lumbar back-  mild redness and swelling in a linear swath starting at the midline; upper back with old scars on L side very reminescent of zoster HEENT- RESPIRATORY: Breathing is even, unlabored. Lung sounds are clear   CARDIOVASCULAR: Heart RRR no murmurs, rubs or gallops. No peripheral edema.  GASTROINTESTINAL: Abdomen is soft, non-tender, not distended w/ normal bowel sound MUSCULOSKELETAL: No abnormal joints or musculature NEUROLOGIC:. Cranial nerves 2-12 grossly intact. Moves all extremities  PSYCHIATRIC: Mood and affect appropriate to situation, no behavioral  issues  Patient Active Problem List   Diagnosis Date Noted  . Unspecified protein-calorie malnutrition 10/26/2012  . Herpes zoster 10/26/2012  . HCAP (healthcare-associated pneumonia) 10/07/2012  . Nausea and vomiting 10/07/2012  . Acute kidney failure 10/07/2012  . Small bowel obstruction 10/07/2012  . Nonsustained ventricular tachycardia 09/17/2012  . Abnormal transaminases 09/17/2012  . Hypercapnia 09/17/2012  . Shock 09/17/2012  . Edema of left upper extremity 09/17/2012  . Erythema left hand 09/17/2012  . Acute respiratory failure with hypoxia 09/12/2012  . Diarrhea 09/11/2012  . Hypotension, unspecified 09/11/2012  . Thrombocytopenia, unspecified 09/11/2012  . Cellulitis of extremity 09/10/2012  . Acute systolic congestive heart failure, NYHA class 4   . Hypertension   . Diabetes mellitus without complication     Functional assessment:   CBC    Component Value Date/Time   WBC 9.0 10/12/2012 0502   RBC 4.86 10/12/2012 0502   HGB 12.5* 10/12/2012 0502   HCT 37.9* 10/12/2012 0502   PLT 85* 10/12/2012 0502   MCV 78.0 10/12/2012 0502   LYMPHSABS 2.1 10/06/2012 2305   MONOABS 0.8 10/06/2012 2305   EOSABS 0.0 10/06/2012 2305   BASOSABS 0.0 10/06/2012 2305    CMP     Component Value Date/Time   NA 139 10/13/2012 0400   K 4.5 10/13/2012 0400   CL 106 10/13/2012 0400   CO2 27 10/13/2012 0400   GLUCOSE 99 10/13/2012 0400   BUN 37* 10/13/2012 0400   CREATININE 1.38* 10/13/2012 0400   CALCIUM 8.4 10/13/2012 0400   PROT 6.3 10/08/2012 0757   ALBUMIN 1.5* 10/08/2012 0757   AST 12 10/08/2012 0757   ALT 11 10/08/2012 0757   ALKPHOS 103 10/08/2012 0757   BILITOT 0.8 10/08/2012 0757   GFRNONAA 54* 10/13/2012 0400   GFRAA 62* 10/13/2012 0400    Assessment and Plan  Unspecified protein-calorie malnutrition 10/08/2012- albumin was 1.5;  pt is only eating 40% of meals;will start remeron 7.5 mg q HS  Herpes zoster Rash on L side low back, with sx of itching and pain radiating to front appears to be  zoster;pt has old scars on L upper back , just one sided , that looks like old outbreak;will start acyclovir 800mg  5X a day for 7 days.    Margit Hanks, MD

## 2012-10-26 NOTE — Assessment & Plan Note (Signed)
10/08/2012- albumin was 1.5;  pt is only eating 40% of meals;will start remeron 7.5 mg q HS

## 2012-11-01 DIAGNOSIS — I4892 Unspecified atrial flutter: Secondary | ICD-10-CM

## 2012-11-01 HISTORY — DX: Unspecified atrial flutter: I48.92

## 2012-11-04 ENCOUNTER — Encounter: Payer: Self-pay | Admitting: Internal Medicine

## 2012-11-04 ENCOUNTER — Non-Acute Institutional Stay (SKILLED_NURSING_FACILITY): Payer: Medicare Other | Admitting: Internal Medicine

## 2012-11-04 DIAGNOSIS — B029 Zoster without complications: Secondary | ICD-10-CM

## 2012-11-04 NOTE — Progress Notes (Signed)
Patient ID: ARLAN BIRKS, male   DOB: [birth year], 62 y.o.   MRN: [id number] Location:  [facility] Provider:  Gwenith Spitz. Renato Gails, D.O., C.M.D.  Code Status:  Full code   Chief Complaint  Patient presents with  . Acute Visit    shingles right ribs    HPI:  62 yo black male here for short term rehab was seen for an acute visit due to vesicular, pruritic and draining rash on right thoracic area.  He is not yet on treatment for this, but is immunocompromised.  This began within the past 72 hours so will begin treatment.    Review of Systems:  Review of Systems  Constitutional: Positive for malaise/fatigue. Negative for fever and chills.  Respiratory: Negative for cough and shortness of breath.   Cardiovascular: Negative for chest pain.  Gastrointestinal: Negative for nausea.  Genitourinary: Negative for dysuria.  Skin: Positive for itching and rash.       And pain in right thoracic area  Neurological: Positive for weakness.    Medications: Patient's Medications  New Prescriptions   No medications on file  Previous Medications   ALLOPURINOL (ZYLOPRIM) 100 MG TABLET    Take 1 tablet (100 mg total) by mouth daily.   AMIODARONE (PACERONE) 200 MG TABLET    Take 1 tablet (200 mg total) by mouth daily.   ASPIRIN EC 81 MG TABLET    Take 81 mg by mouth daily.   CHOLECALCIFEROL (VITAMIN D) 1000 UNITS TABLET    Take 1,000 Units by mouth daily.   COLCHICINE 0.6 MG TABLET    Take 1 tablet (0.6 mg total) by mouth daily.   DIGOXIN (LANOXIN) 0.125 MG TABLET    Take 1 tablet (0.125 mg total) by mouth daily.   IPRATROPIUM-ALBUTEROL (DUONEB) 0.5-2.5 (3) MG/3ML SOLN    Take 3 mLs by nebulization 4 (four) times daily.   IPRATROPIUM-ALBUTEROL (DUONEB) 0.5-2.5 (3) MG/3ML SOLN    Take 3 mLs by nebulization every 3 (three) hours as needed (for wheezing /shortness of breath).   ISOSORBIDE-HYDRALAZINE (BIDIL) 20-37.5 MG PER TABLET    Take 1 tablet by mouth 3 (three) times daily.   METOPROLOL  TARTRATE (LOPRESSOR) 12.5 MG TABS    Take 0.5 tablets (12.5 mg total) by mouth 2 (two) times daily.   MULTIPLE VITAMIN (MULTIVITAMIN WITH MINERALS) TABS TABLET    Take 1 tablet by mouth daily.   OMEPRAZOLE (PRILOSEC) 40 MG CAPSULE    Take 40 mg by mouth daily.   POTASSIUM CHLORIDE SA (K-DUR,KLOR-CON) 20 MEQ TABLET    Take 40 mEq by mouth daily.   TORSEMIDE (DEMADEX) 20 MG TABLET    Take 2 tablets (40 mg total) by mouth 2 (two) times daily.  Modified Medications   No medications on file  Discontinued Medications   No medications on file    Physical Exam:  Physical Exam  Vitals reviewed. Constitutional: No distress.  Cardiovascular: Normal rate, regular rhythm, normal heart sounds and intact distal pulses.   Pulmonary/Chest: Effort normal and breath sounds normal. No respiratory distress.  Abdominal: Soft. Bowel sounds are normal. He exhibits no distension and no mass. There is no tenderness.  Skin:  Right thoracic area with papulovesicular rash, many are now covered with eschar, some still draining, evidence of excoriation     Labs reviewed: Basic Metabolic Panel:  Recent Labs  86/57/84 1800  09/16/12 0310  09/21/12 0450 09/22/12 0500  10/07/12 0930 10/07/12 0934  10/11/12 0635 10/12/12 0502 10/13/12 0400  NA  --   < > 142  < > 139 140  < > 139 140  < > 145 142 139  K  --   < > 3.7  < > 4.5 4.7  < > 4.4 5.0  < > 4.4 4.5 4.5  CL  --   < > 103  < > 99 100  < > 98 96  < > 112 108 106  CO2  --   < > 36*  < > 31 33*  < > 23 25  < > 24 24 27   GLUCOSE  --   < > 105*  < > 90 118*  < > 115* 117*  < > 91 98 99  BUN  --   < > 31*  < > 52* 48*  < > 88* 90*  < > 54* 44* 37*  CREATININE  --   < > 0.98  < > 1.99* 2.10*  < > 4.13* 4.06*  < > 1.48* 1.39* 1.38*  CALCIUM  --   < > 8.0*  < > 8.6 8.8  < > 8.9 8.9  < > 8.7 8.6 8.4  MG 2.3  --  2.2  --   --   --   --   --  2.0  --   --   --   --   PHOS  --   --  2.9  < > 3.7 3.3  --  6.6*  --   --   --   --   --   < > = values in this  interval not displayed.  Liver Function Tests:  Recent Labs  09/23/12 0450 10/06/12 2305 10/07/12 0930 10/08/12 0757  AST 20 22  --  12  ALT 30 20  --  11  ALKPHOS 85 107  --  103  BILITOT 1.3* 1.6*  --  0.8  PROT 6.0 8.4*  --  6.3  ALBUMIN 1.7* 2.3* 1.9* 1.5*    CBC:  Recent Labs  09/10/12 1710  09/15/12 1059  10/06/12 2305  10/08/12 0757 10/09/12 0610 10/12/12 0502  WBC 7.2  < > 12.2*  < > 8.3  < > 9.8 9.0 9.0  NEUTROABS 5.9  --  10.6*  --  5.4  --   --   --   --   HGB 16.5  < > 14.8  < > 17.2*  < > 13.7 12.7* 12.5*  HCT 48.7  < > 43.0  < > 50.6  < > 40.1 38.3* 37.9*  MCV 78.2  < > 77.6*  < > 76.1*  < > 76.4* 76.9* 78.0  PLT 52*  < > 48*  < > 129*  < > 90* 72* 85*  < > = values in this interval not displayed.   Assessment/Plan 1. Herpes zoster -new onset in last 72 hours so will begin treatment with acyclovir -droplet precautions -will add gabapentin or lyrica if he has difficulty with pain the area, could also add prednisone, but already immunocompromised so will try to avoid

## 2012-11-23 ENCOUNTER — Encounter: Payer: Self-pay | Admitting: Internal Medicine

## 2012-11-23 ENCOUNTER — Emergency Department (HOSPITAL_COMMUNITY): Payer: Medicare Other

## 2012-11-23 ENCOUNTER — Non-Acute Institutional Stay (SKILLED_NURSING_FACILITY): Payer: Medicare Other | Admitting: Internal Medicine

## 2012-11-23 ENCOUNTER — Encounter (HOSPITAL_COMMUNITY): Payer: Self-pay | Admitting: Emergency Medicine

## 2012-11-23 ENCOUNTER — Inpatient Hospital Stay (HOSPITAL_COMMUNITY)
Admission: EM | Admit: 2012-11-23 | Discharge: 2012-12-02 | DRG: 682 | Disposition: A | Discharge status: Skilled Nursing Facility | Payer: Medicare Other | Attending: Internal Medicine | Admitting: Internal Medicine

## 2012-11-23 DIAGNOSIS — I5021 Acute systolic (congestive) heart failure: Secondary | ICD-10-CM

## 2012-11-23 DIAGNOSIS — I5043 Acute on chronic combined systolic (congestive) and diastolic (congestive) heart failure: Secondary | ICD-10-CM | POA: Diagnosis present

## 2012-11-23 DIAGNOSIS — N179 Acute kidney failure, unspecified: Principal | ICD-10-CM | POA: Diagnosis present

## 2012-11-23 DIAGNOSIS — I429 Cardiomyopathy, unspecified: Secondary | ICD-10-CM

## 2012-11-23 DIAGNOSIS — N183 Chronic kidney disease, stage 3 unspecified: Secondary | ICD-10-CM | POA: Diagnosis present

## 2012-11-23 DIAGNOSIS — I10 Essential (primary) hypertension: Secondary | ICD-10-CM

## 2012-11-23 DIAGNOSIS — I498 Other specified cardiac arrhythmias: Secondary | ICD-10-CM | POA: Diagnosis present

## 2012-11-23 DIAGNOSIS — J449 Chronic obstructive pulmonary disease, unspecified: Secondary | ICD-10-CM | POA: Diagnosis present

## 2012-11-23 DIAGNOSIS — J4489 Other specified chronic obstructive pulmonary disease: Secondary | ICD-10-CM | POA: Diagnosis present

## 2012-11-23 DIAGNOSIS — E860 Dehydration: Secondary | ICD-10-CM | POA: Diagnosis present

## 2012-11-23 DIAGNOSIS — Z8679 Personal history of other diseases of the circulatory system: Secondary | ICD-10-CM

## 2012-11-23 DIAGNOSIS — A419 Sepsis, unspecified organism: Secondary | ICD-10-CM | POA: Diagnosis present

## 2012-11-23 DIAGNOSIS — R570 Cardiogenic shock: Secondary | ICD-10-CM | POA: Diagnosis present

## 2012-11-23 DIAGNOSIS — I959 Hypotension, unspecified: Secondary | ICD-10-CM | POA: Diagnosis present

## 2012-11-23 DIAGNOSIS — T50995A Adverse effect of other drugs, medicaments and biological substances, initial encounter: Secondary | ICD-10-CM | POA: Diagnosis present

## 2012-11-23 DIAGNOSIS — Z7982 Long term (current) use of aspirin: Secondary | ICD-10-CM

## 2012-11-23 DIAGNOSIS — R197 Diarrhea, unspecified: Secondary | ICD-10-CM

## 2012-11-23 DIAGNOSIS — D649 Anemia, unspecified: Secondary | ICD-10-CM | POA: Diagnosis present

## 2012-11-23 DIAGNOSIS — Z87891 Personal history of nicotine dependence: Secondary | ICD-10-CM

## 2012-11-23 DIAGNOSIS — Z66 Do not resuscitate: Secondary | ICD-10-CM | POA: Diagnosis present

## 2012-11-23 DIAGNOSIS — N390 Urinary tract infection, site not specified: Secondary | ICD-10-CM | POA: Diagnosis present

## 2012-11-23 DIAGNOSIS — I4892 Unspecified atrial flutter: Secondary | ICD-10-CM | POA: Diagnosis present

## 2012-11-23 DIAGNOSIS — E119 Type 2 diabetes mellitus without complications: Secondary | ICD-10-CM | POA: Diagnosis present

## 2012-11-23 DIAGNOSIS — I509 Heart failure, unspecified: Secondary | ICD-10-CM

## 2012-11-23 DIAGNOSIS — T460X1A Poisoning by cardiac-stimulant glycosides and drugs of similar action, accidental (unintentional), initial encounter: Secondary | ICD-10-CM

## 2012-11-23 DIAGNOSIS — T460X5A Adverse effect of cardiac-stimulant glycosides and drugs of similar action, initial encounter: Secondary | ICD-10-CM | POA: Diagnosis present

## 2012-11-23 DIAGNOSIS — R5381 Other malaise: Secondary | ICD-10-CM | POA: Diagnosis present

## 2012-11-23 DIAGNOSIS — R634 Abnormal weight loss: Secondary | ICD-10-CM

## 2012-11-23 DIAGNOSIS — R748 Abnormal levels of other serum enzymes: Secondary | ICD-10-CM

## 2012-11-23 DIAGNOSIS — I279 Pulmonary heart disease, unspecified: Secondary | ICD-10-CM | POA: Diagnosis present

## 2012-11-23 DIAGNOSIS — I4510 Unspecified right bundle-branch block: Secondary | ICD-10-CM | POA: Diagnosis present

## 2012-11-23 DIAGNOSIS — I129 Hypertensive chronic kidney disease with stage 1 through stage 4 chronic kidney disease, or unspecified chronic kidney disease: Secondary | ICD-10-CM | POA: Diagnosis present

## 2012-11-23 DIAGNOSIS — E46 Unspecified protein-calorie malnutrition: Secondary | ICD-10-CM | POA: Diagnosis present

## 2012-11-23 DIAGNOSIS — E8809 Other disorders of plasma-protein metabolism, not elsewhere classified: Secondary | ICD-10-CM | POA: Diagnosis present

## 2012-11-23 DIAGNOSIS — E875 Hyperkalemia: Secondary | ICD-10-CM

## 2012-11-23 DIAGNOSIS — I5020 Unspecified systolic (congestive) heart failure: Secondary | ICD-10-CM

## 2012-11-23 DIAGNOSIS — D696 Thrombocytopenia, unspecified: Secondary | ICD-10-CM

## 2012-11-23 DIAGNOSIS — R627 Adult failure to thrive: Secondary | ICD-10-CM | POA: Diagnosis present

## 2012-11-23 DIAGNOSIS — B952 Enterococcus as the cause of diseases classified elsewhere: Secondary | ICD-10-CM | POA: Diagnosis present

## 2012-11-23 DIAGNOSIS — I428 Other cardiomyopathies: Secondary | ICD-10-CM | POA: Diagnosis present

## 2012-11-23 DIAGNOSIS — I119 Hypertensive heart disease without heart failure: Secondary | ICD-10-CM | POA: Diagnosis present

## 2012-11-23 DIAGNOSIS — I5022 Chronic systolic (congestive) heart failure: Secondary | ICD-10-CM

## 2012-11-23 DIAGNOSIS — E876 Hypokalemia: Secondary | ICD-10-CM | POA: Diagnosis present

## 2012-11-23 HISTORY — DX: Acute kidney failure, unspecified: N17.9

## 2012-11-23 HISTORY — DX: Herpesviral infection, unspecified: B00.9

## 2012-11-23 LAB — URINALYSIS, ROUTINE W REFLEX MICROSCOPIC
Nitrite: POSITIVE — AB
Specific Gravity, Urine: 1.023 (ref 1.005–1.030)
Urobilinogen, UA: 0.2 mg/dL (ref 0.0–1.0)

## 2012-11-23 LAB — BLOOD GAS, ARTERIAL
Acid-base deficit: 1.6 mmol/L (ref 0.0–2.0)
Bicarbonate: 22.7 mEq/L (ref 20.0–24.0)
O2 Saturation: 96.7 %
TCO2: 20.3 mmol/L (ref 0–100)
pH, Arterial: 7.383 (ref 7.350–7.450)
pO2, Arterial: 92.3 mmHg (ref 80.0–100.0)

## 2012-11-23 LAB — POCT I-STAT, CHEM 8
BUN: 129 mg/dL — ABNORMAL HIGH (ref 6–23)
Calcium, Ion: 1.05 mmol/L — ABNORMAL LOW (ref 1.13–1.30)
Chloride: 105 mEq/L (ref 96–112)
Creatinine, Ser: 5.5 mg/dL — ABNORMAL HIGH (ref 0.50–1.35)
Glucose, Bld: 83 mg/dL (ref 70–99)
Hemoglobin: 16.3 g/dL (ref 13.0–17.0)
Sodium: 137 mEq/L (ref 135–145)
TCO2: 23 mmol/L (ref 0–100)

## 2012-11-23 LAB — CBC WITH DIFFERENTIAL/PLATELET
Basophils Absolute: 0.1 10*3/uL (ref 0.0–0.1)
Eosinophils Absolute: 0.3 10*3/uL (ref 0.0–0.7)
HCT: 43 % (ref 39.0–52.0)
Lymphs Abs: 3.1 10*3/uL (ref 0.7–4.0)
MCHC: 33.3 g/dL (ref 30.0–36.0)
MCV: 76.5 fL — ABNORMAL LOW (ref 78.0–100.0)
Neutro Abs: 2.5 10*3/uL (ref 1.7–7.7)
RDW: 17.3 % — ABNORMAL HIGH (ref 11.5–15.5)

## 2012-11-23 LAB — URINE MICROSCOPIC-ADD ON

## 2012-11-23 LAB — COMPREHENSIVE METABOLIC PANEL
ALT: 26 U/L (ref 0–53)
Albumin: 2.6 g/dL — ABNORMAL LOW (ref 3.5–5.2)
Alkaline Phosphatase: 101 U/L (ref 39–117)
Potassium: 5.8 mEq/L — ABNORMAL HIGH (ref 3.5–5.1)
Sodium: 132 mEq/L — ABNORMAL LOW (ref 135–145)
Total Protein: 8.5 g/dL — ABNORMAL HIGH (ref 6.0–8.3)

## 2012-11-23 MED ORDER — DEXTROSE 5 % IV SOLN
1.0000 g | Freq: Once | INTRAVENOUS | Status: AC
  Administered 2012-11-23: 1 g via INTRAVENOUS
  Filled 2012-11-23: qty 10

## 2012-11-23 MED ORDER — INSULIN ASPART 100 UNIT/ML ~~LOC~~ SOLN
10.0000 [IU] | Freq: Once | SUBCUTANEOUS | Status: AC
  Administered 2012-11-23: 10 [IU] via INTRAVENOUS
  Filled 2012-11-23: qty 1

## 2012-11-23 MED ORDER — INSULIN ASPART 100 UNIT/ML ~~LOC~~ SOLN
10.0000 [IU] | Freq: Once | SUBCUTANEOUS | Status: AC
  Administered 2012-11-23: 100 [IU] via INTRAVENOUS
  Filled 2012-11-23: qty 1

## 2012-11-23 MED ORDER — SODIUM POLYSTYRENE SULFONATE 15 GM/60ML PO SUSP
15.0000 g | Freq: Once | ORAL | Status: AC
  Administered 2012-11-23: 15 g via ORAL
  Filled 2012-11-23: qty 60

## 2012-11-23 MED ORDER — SODIUM CHLORIDE 0.9 % IV BOLUS (SEPSIS)
250.0000 mL | Freq: Once | INTRAVENOUS | Status: AC
  Administered 2012-11-23: 250 mL via INTRAVENOUS

## 2012-11-23 MED ORDER — SODIUM CHLORIDE 0.9 % IV SOLN
6.0000 | Freq: Once | INTRAVENOUS | Status: AC
  Administered 2012-11-23: 6 via INTRAVENOUS
  Filled 2012-11-23: qty 240

## 2012-11-23 MED ORDER — DEXTROSE 50 % IV SOLN
1.0000 | Freq: Once | INTRAVENOUS | Status: AC
  Administered 2012-11-23: 50 mL via INTRAVENOUS
  Filled 2012-11-23: qty 50

## 2012-11-23 MED ORDER — SODIUM CHLORIDE 0.9 % IV SOLN
1.0000 g | Freq: Once | INTRAVENOUS | Status: AC
  Administered 2012-11-23: 1 g via INTRAVENOUS
  Filled 2012-11-23: qty 10

## 2012-11-23 NOTE — ED Notes (Signed)
Bed: WA09 Expected date: 11/23/12 Expected time: 8:08 PM Means of arrival: Ambulance Comments: 62 yo M  General weakness, abnormal labs

## 2012-11-23 NOTE — ED Notes (Signed)
Brought in by EMS from [facility] facility (off [address]) with c/o "abnormal lab results".  Per EMS, staff at the facility reported that pt has not been feeling well for the past 2 weeks, pt has had lab works done today and there were significant abnormal results--- his PCP at the facility recommended pt to be further evaluated;  Pt's Potassium level = 6.0,  BUN = 135,  Creatinine = 4.95.

## 2012-11-23 NOTE — Progress Notes (Signed)
CSW spoke with pt at bedside.  He confirmed that he is a resident at [facility] for [facility].  Pt reports that he plans to return to [facility] to complete his rehab once he is medically stable.  Pt reports that his niece Lavell Islam is his HCPOA but he didn't know her number from memory.  Pt thanked CSW for concern.  Marva Panda, Theresia Majors  [phone]  .11/23/2012

## 2012-11-23 NOTE — ED Provider Notes (Addendum)
CSN: [id number]     Arrival date & time 11/23/12  2019 History   First MD Initiated Contact with Patient 11/23/12 2040     Chief Complaint  Patient presents with  . Abnormal Lab Results    (Consider location/radiation/quality/duration/timing/severity/associated sxs/prior Treatment) HPI This is a 62 year old male with a history of diabetes, CHF, hypertension, and COPD who presents from his living facility with lab abnormalities. Patient reports just not feeling well over the last several weeks. He states he feels nauseated. He has vomited once. He denies any chest pain or shortness of breath. He states that his urine output has been within normal limits. He states "I just dont feel good."  Patient reportedly had basic labwork done that showed a potassium of 6.0, BUN of 135, and a creatinine of 4.95. He has a history of acute kidney injury during his last hospitalization but his creatinine was 1.3 at discharge.   Past Medical History  Diagnosis Date  . Diabetes mellitus without complication   . CHF (congestive heart failure)   . Hypertension   . Cardiomyopathy, dilated   . Small bowel obstruction 10/2012  . COPD (chronic obstructive pulmonary disease)    Past Surgical History  Procedure Laterality Date  . Laparoscopic incisional / umbilical / ventral hernia repair     Family History  Problem Relation Age of Onset  . Cancer Neg Hx    History  Substance Use Topics  . Smoking status: Former Smoker -- 0.25 packs/day for .5 years    Types: Cigarettes    Quit date: 09/28/2012  . Smokeless tobacco: Not on file  . Alcohol Use: No    Review of Systems  Constitutional: Negative.  Negative for fever and chills.  HENT: Negative for sore throat.   Respiratory: Negative.  Negative for cough, chest tightness and shortness of breath.   Cardiovascular: Negative.  Negative for chest pain.  Gastrointestinal: Positive for nausea and vomiting. Negative for abdominal pain and diarrhea.   Genitourinary: Negative.  Negative for dysuria and frequency.  Musculoskeletal: Negative for back pain.  Skin: Negative for rash.  Neurological: Negative for headaches.  Psychiatric/Behavioral: Negative for confusion.  All other systems reviewed and are negative.    Allergies  Other  Home Medications   Current Outpatient Rx  Name  Route  Sig  Dispense  Refill  . allopurinol (ZYLOPRIM) 100 MG tablet   Oral   Take 1 tablet (100 mg total) by mouth daily.   30 tablet   3   . amiodarone (PACERONE) 200 MG tablet   Oral   Take 1 tablet (200 mg total) by mouth daily.   30 tablet   0   . aspirin EC 81 MG tablet   Oral   Take 81 mg by mouth daily.         . cholecalciferol (VITAMIN D) 1000 UNITS tablet   Oral   Take 1,000 Units by mouth daily.         . colchicine 0.6 MG tablet   Oral   Take 1 tablet (0.6 mg total) by mouth daily.   30 tablet   3   . digoxin (LANOXIN) 0.125 MG tablet   Oral   Take 1 tablet (0.125 mg total) by mouth daily.   30 tablet   10   . feeding supplement (PRO-STAT SUGAR FREE 64) LIQD   Oral   Take 30 mLs by mouth 3 (three) times daily with meals.         Marland Kitchen  HYDROcodone-acetaminophen (NORCO/VICODIN) 5-325 MG per tablet   Oral   Take 1 tablet by mouth every 6 (six) hours as needed for pain.         Marland Kitchen ipratropium-albuterol (DUONEB) 0.5-2.5 (3) MG/3ML SOLN   Nebulization   Take 3 mLs by nebulization 4 (four) times daily.         Marland Kitchen ipratropium-albuterol (DUONEB) 0.5-2.5 (3) MG/3ML SOLN   Nebulization   Take 3 mLs by nebulization every 3 (three) hours as needed (for wheezing /shortness of breath).         . isosorbide-hydrALAZINE (BIDIL) 20-37.5 MG per tablet   Oral   Take 1 tablet by mouth 3 (three) times daily.   120 tablet   10   . metoprolol tartrate (LOPRESSOR) 25 MG tablet   Oral   Take 6.25 mg by mouth 2 (two) times daily. Hold if pulse < 50; SBP < 100         . mirtazapine (REMERON) 7.5 MG tablet   Oral   Take  7.5 mg by mouth at bedtime.         . Multiple Vitamin (MULTIVITAMIN WITH MINERALS) TABS tablet   Oral   Take 2 tablets by mouth daily.          Marland Kitchen omeprazole (PRILOSEC) 40 MG capsule   Oral   Take 40 mg by mouth daily.         . potassium chloride SA (K-DUR,KLOR-CON) 20 MEQ tablet   Oral   Take 40 mEq by mouth daily.         Marland Kitchen torsemide (DEMADEX) 20 MG tablet   Oral   Take 2 tablets (40 mg total) by mouth 2 (two) times daily.   120 tablet   2    BP 117/61  Pulse 98  Temp(Src) 97.8 F (36.6 C) (Oral)  Resp 14  SpO2 96% Physical Exam  Nursing note and vitals reviewed. Constitutional: He is oriented to person, place, and time. No distress.  Elderly  HENT:  Head: Normocephalic and atraumatic.  Eyes: Pupils are equal, round, and reactive to light.  Neck: Neck supple.  Cardiovascular: Regular rhythm and normal heart sounds.   No murmur heard. Tachycardia  Pulmonary/Chest: Effort normal and breath sounds normal. No respiratory distress. He has no wheezes.  Abdominal: Soft. Bowel sounds are normal. There is no tenderness. There is no rebound.  Genitourinary:  Patient wears a diaper  Musculoskeletal: He exhibits no edema.  Lymphadenopathy:    He has no cervical adenopathy.  Neurological: He is alert and oriented to person, place, and time.  Moves all 4 extremities, normal reflexes, cranial nerves II through XII intact  Skin: Skin is warm and dry. No rash noted.  Psychiatric: He has a normal mood and affect.    ED Course  Procedures (including critical care time) Labs Review Labs Reviewed  URINALYSIS, ROUTINE W REFLEX MICROSCOPIC - Abnormal; Notable for the following:    APPearance TURBID (*)    Hgb urine dipstick MODERATE (*)    Bilirubin Urine SMALL (*)    Protein, ur 100 (*)    Nitrite POSITIVE (*)    Leukocytes, UA LARGE (*)    All other components within normal limits  COMPREHENSIVE METABOLIC PANEL - Abnormal; Notable for the following:    Sodium 132  (*)    Potassium 5.8 (*)    Chloride 93 (*)    BUN 134 (*)    Creatinine, Ser 5.07 (*)    Total Protein 8.5 (*)  Albumin 2.6 (*)    AST 39 (*)    GFR calc non Af Amer 11 (*)    GFR calc Af Amer 13 (*)    All other components within normal limits  CBC WITH DIFFERENTIAL - Abnormal; Notable for the following:    MCV 76.5 (*)    MCH 25.4 (*)    RDW 17.3 (*)    Platelets 116 (*)    Neutrophils Relative % 39 (*)    Lymphocytes Relative 47 (*)    All other components within normal limits  URINE MICROSCOPIC-ADD ON - Abnormal; Notable for the following:    Squamous Epithelial / LPF FEW (*)    Bacteria, UA MANY (*)    All other components within normal limits  DIGOXIN LEVEL - Abnormal; Notable for the following:    Digoxin Level 5.1 (*)    All other components within normal limits  POCT I-STAT, CHEM 8 - Abnormal; Notable for the following:    Potassium 5.8 (*)    BUN 129 (*)    Creatinine, Ser 5.50 (*)    Calcium, Ion 1.05 (*)    All other components within normal limits  URINE CULTURE  CULTURE, BLOOD (ROUTINE X 2)  CULTURE, BLOOD (ROUTINE X 2)  BLOOD GAS, ARTERIAL  NA AND K (SODIUM & POTASSIUM), RAND UR  CREATININE, URINE, RANDOM  CG4 I-STAT (LACTIC ACID)  POCT I-STAT TROPONIN I    EKG independently reviewed by myself: Sinus rhythm with a rate of 108, right bundle branch block noted, widening of the QRS when compared to previous  CRITICAL CARE Performed by: Ross Marcus, F   Total critical care time: 40 min  Critical care time was exclusive of separately billable procedures and treating other patients.  Critical care was necessary to treat or prevent imminent or life-threatening deterioration.  Critical care was time spent personally by me on the following activities: development of treatment plan with patient and/or surrogate as well as nursing, discussions with consultants, evaluation of patient's response to treatment, examination of patient, obtaining history  from patient or surrogate, ordering and performing treatments and interventions, ordering and review of laboratory studies, ordering and review of radiographic studies, pulse oximetry and re-evaluation of patient's condition.  Imaging Review US Renal  11/23/2012   CLINICAL DATA:  Renal failure.  EXAM: RENAL/URINARY TRACT ULTRASOUND COMPLETE  COMPARISON:  Renal ultrasound 10/07/2012 and CT abdomen and pelvis 10/08/2012.  FINDINGS: Right Kidney  Length: Measures 10.9 cm. There is some increase in cortical echogenicity. No stone, mass or hydronephrosis.  Left Kidney  Length: Measures 11.1 cm There is some increase in cortical echogenicity. No stone, mass or hydronephrosis.  Bladder: Bilateral ureteral jets are identified. No abnormality is seen.  IMPRESSION: Negative for hydronephrosis or acute abnormality.  Mildly increased cortical echogenicity compatible with medical renal disease.   Electronically Signed   By: Drusilla Kanner M.D.   On: 11/23/2012 23:14    MDM   1. Acute kidney injury   2. Hyperkalemia   3. Digoxin toxicity, initial encounter     This is a 62 year old male who presents with acute kidney injury and hyperkalemia. Initial vital signs are notable for tachycardia and a blood pressure of 93/62. The patient denies any current complaints. EKG was obtained and shows widening of his QRS from baseline. Given report of hyperkalemia, patient insulin, glucose, Kayexalate, and calcium gluconate. Basic labwork was obtained. Patient was also noted to be on digoxin after being treated for hyperkalemia. Dig level was  sent. Dig level is critically high at 5.5. BMP confirms acute kidney injury with hyperkalemia. The patient was discussed with nephrologist on call. A renal ultrasound will be obtained. Digibind was ordered for the patient.  I discussed the patient with the hospitalist who agrees that the patient should be transferred over to [facility]  in the event that he needs emergent dialysis. He will  be admitted to the step down unit. Patient was redosed Insulin and glucose x 1.     Shon Baton, MD 11/23/12 4098  Shon Baton, MD 11/25/12 1236

## 2012-11-23 NOTE — H&P (Addendum)
[facility] History and Physical  Caleb Bauer ZOX:[id number] DOB: Oct 13, 1950 DOA: 11/23/2012  Referring physician: ER physician. PCP: Bufford Spikes, DO  Chief Complaint: Abnormal labs.  HPI: Caleb Bauer is a 62 y.o. male who was admitted in July at [facility] for sepsis and at that time patient also was found to have dilated cardiomyopathy was brought to the ER today after patient's labs were found to be abnormal. Patient was found to have acute on chronic renal failure. In the ER patient also was found to be hyperkalemic and digoxin levels were found to be elevated. At this time Digibind has been already started. On-call nephrologist and cardiologist has been consulted. Patient will be transferred to [facility] for further management. Patient is agreeable to transfer. Dr. Allena Katz hospitalist will be accepting physician at [facility]. Patient was started on acyclovir part of this month for herpes zoster. Patient also was last month started on colchicine and allopurinol for gout. Patient states he has been having poor appetite for last few weeks and has been hardly eating anything. He has had one episode of diarrhea today and one last week. Patient otherwise denies any chest pain shortness of breath abdominal pain fever chills. 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  Review of Systems: As presented in the history of presenting illness, rest negative.  Past Medical History  Diagnosis Date  . Diabetes mellitus without complication   . CHF (congestive heart failure)   . Hypertension   . Cardiomyopathy, dilated   . Small bowel obstruction 10/2012  . COPD (chronic obstructive pulmonary disease)    Past Surgical History  Procedure Laterality Date  . Laparoscopic incisional / umbilical / ventral hernia repair     Social History:  reports that he quit smoking about 8 weeks ago. His smoking use included Cigarettes. He has a .125 pack-year smoking history. He does not have  any smokeless tobacco history on file. He reports that he does not drink alcohol or use illicit drugs. Nursing home. where does patient live-- Not sure at this time. Can patient participate in ADLs?  Allergies  Allergen Reactions  . Other Nausea And Vomiting    Patient stated drug is "kerein"    Family History  Problem Relation Age of Onset  . Cancer Neg Hx       Prior to Admission medications   Medication Sig Start Date End Date Taking? Authorizing Provider  allopurinol (ZYLOPRIM) 100 MG tablet Take 1 tablet (100 mg total) by mouth daily. 10/20/12  Yes Tiffany L Reed, DO  amiodarone (PACERONE) 200 MG tablet Take 1 tablet (200 mg total) by mouth daily. 09/24/12  Yes Richarda Overlie, MD  aspirin EC 81 MG tablet Take 81 mg by mouth daily.   Yes Historical Provider, MD  cholecalciferol (VITAMIN D) 1000 UNITS tablet Take 1,000 Units by mouth daily.   Yes Historical Provider, MD  colchicine 0.6 MG tablet Take 1 tablet (0.6 mg total) by mouth daily. 10/20/12  Yes Tiffany L Reed, DO  digoxin (LANOXIN) 0.125 MG tablet Take 1 tablet (0.125 mg total) by mouth daily. 09/24/12  Yes Richarda Overlie, MD  feeding supplement (PRO-STAT SUGAR FREE 64) LIQD Take 30 mLs by mouth 3 (three) times daily with meals.   Yes Historical Provider, MD  HYDROcodone-acetaminophen (NORCO/VICODIN) 5-325 MG per tablet Take 1 tablet by mouth every 6 (six) hours as needed for pain.   Yes Historical Provider, MD  ipratropium-albuterol (DUONEB) 0.5-2.5 (3) MG/3ML SOLN Take 3  mLs by nebulization 4 (four) times daily.   Yes Historical Provider, MD  ipratropium-albuterol (DUONEB) 0.5-2.5 (3) MG/3ML SOLN Take 3 mLs by nebulization every 3 (three) hours as needed (for wheezing /shortness of breath).   Yes Historical Provider, MD  isosorbide-hydrALAZINE (BIDIL) 20-37.5 MG per tablet Take 1 tablet by mouth 3 (three) times daily. 09/24/12  Yes Richarda Overlie, MD  metoprolol tartrate (LOPRESSOR) 25 MG tablet Take 6.25 mg by mouth 2 (two) times  daily. Hold if pulse < 50; SBP < 100   Yes Historical Provider, MD  mirtazapine (REMERON) 7.5 MG tablet Take 7.5 mg by mouth at bedtime.   Yes Historical Provider, MD  Multiple Vitamin (MULTIVITAMIN WITH MINERALS) TABS tablet Take 2 tablets by mouth daily.    Yes Historical Provider, MD  omeprazole (PRILOSEC) 40 MG capsule Take 40 mg by mouth daily.   Yes Historical Provider, MD  potassium chloride SA (K-DUR,KLOR-CON) 20 MEQ tablet Take 40 mEq by mouth daily.   Yes Historical Provider, MD  torsemide (DEMADEX) 20 MG tablet Take 2 tablets (40 mg total) by mouth 2 (two) times daily. 09/27/12  Yes Richarda Overlie, MD   Physical Exam: Filed Vitals:   11/23/12 2021 11/23/12 2230  BP: 93/62 117/61  Pulse: 108 98  Temp: 97.8 F (36.6 C)   TempSrc: Oral   Resp: 14   SpO2: 93% 96%     General:  Well developed and moderately nourished.  Eyes: Anicteric no pallor.  ENT: No discharge from ears eyes nose mouth.  Neck: No mass felt.  Cardiovascular: S1-S2 heard.  Respiratory: No rhonchi or crepitations.  Abdomen: Soft nontender bowel sounds present.  Skin: No rash.  Musculoskeletal: No edema.  Psychiatric: Appears normal.  Neurologic: Alert awake oriented to time place and person. Moves all extremities.  Labs on Admission:  Basic Metabolic Panel:  Recent Labs Lab 11/23/12 2105 11/23/12 2121  NA 132* 137  K 5.8* 5.8*  CL 93* 105  CO2 21  --   GLUCOSE 86 83  BUN 134* 129*  CREATININE 5.07* 5.50*  CALCIUM 8.9  --    Liver Function Tests:  Recent Labs Lab 11/23/12 2105  AST 39*  ALT 26  ALKPHOS 101  BILITOT 0.6  PROT 8.5*  ALBUMIN 2.6*   No results found for this basename: LIPASE, AMYLASE,  in the last 168 hours No results found for this basename: AMMONIA,  in the last 168 hours CBC:  Recent Labs Lab 11/23/12 2105 11/23/12 2121  WBC 6.5  --   NEUTROABS 2.5  --   HGB 14.3 16.3  HCT 43.0 48.0  MCV 76.5*  --   PLT 116*  --    Cardiac Enzymes: No results  found for this basename: CKTOTAL, CKMB, CKMBINDEX, TROPONINI,  in the last 168 hours  BNP (last 3 results)  Recent Labs  09/25/12 0450 09/26/12 0530 10/06/12 2305  PROBNP 26983.0* 34516.0* 33980.0*   CBG: No results found for this basename: GLUCAP,  in the last 168 hours  Radiological Exams on Admission: US Renal  11/23/2012   CLINICAL DATA:  Renal failure.  EXAM: RENAL/URINARY TRACT ULTRASOUND COMPLETE  COMPARISON:  Renal ultrasound 10/07/2012 and CT abdomen and pelvis 10/08/2012.  FINDINGS: Right Kidney  Length: Measures 10.9 cm. There is some increase in cortical echogenicity. No stone, mass or hydronephrosis.  Left Kidney  Length: Measures 11.1 cm There is some increase in cortical echogenicity. No stone, mass or hydronephrosis.  Bladder: Bilateral ureteral jets are identified. No abnormality  is seen.  IMPRESSION: Negative for hydronephrosis or acute abnormality.  Mildly increased cortical echogenicity compatible with medical renal disease.   Electronically Signed   By: Drusilla Kanner M.D.   On: 11/23/2012 23:14    EKG: Independently reviewed. Sinus tachycardia with RBBB.  Assessment/Plan Principal Problem:   Digoxin toxicity Active Problems:   Thrombocytopenia, unspecified   Acute renal failure   Cardiomyopathy   #1. Digoxin toxicity - at this time patient has been placed on Digibind per pharmacy. I have discussed with on-call cardiologist for [place]. Cardiologist will be seeing patient in consult. As per the recommendation of the cardiologist patient's present medications including amiodarone                 metoprolol and Demadex are on hold. #2. Acute renal failure on chronic kidney disease with hyperkalemia - nephrology has been already consulted. Renal sonogram does not show any obstruction. Urine sodium and creatinine has been ordered to check for FeNa. Patient was recently on acyclovir for herpes zoster and also          his recent starting colchicine and allopurinol. At  this time we're holding off Demadex. Closely follow intake output and metabolic panel. Further recommendations per nephrologist. I think patient's hyperkalemia is also largely contributed by digoxin toxicity. I think hyperkalemia will                 improve with digoxin level correction. Patient did receive Kayexalate. Will not order any calcium gluconate due to digoxin toxicity. #3. Possible UTI - patient has been placed on ceftriaxone. Follow urine cultures. #4. Cardiomyopathy - patient did receive 250 cc normal saline bolus initially the ER as patient's blood pressure was slightly low. For now we will hold off Demadex and closely observe and further recommendations per cardiologist. #5. Chronic thrombocytopenia - closely follow CBC. HIV checked in August was negative. #6. Diabetes mellitus2 - presently patient is kept n.p.o. Closely follow CBG with sliding scale coverage. #7. Patient was intubated in July this year for sepsis. #8. Was admitted in August for partial small bowel obstruction.  Discussed with Critical care.    Code Status: Full code.  Family Communication: None.  Disposition Plan: Admit to inpatient. Patient will be transferred to [facility] for further management as patient has acute renal failure and may need dialysis. Accepting physician is Dr. Allena Katz, hospitalist.                             Cardiology and nephrology has been consulted.    KAKRAKANDY,ARSHAD N. [facility] Pager [phone].  If 7PM-7AM, please contact night-coverage [email or web address] Password [facility] 11/23/2012, 11:42 PM

## 2012-11-23 NOTE — Progress Notes (Signed)
MRN: [id number] Name: Caleb Bauer  Sex: male Age: 62 y.o. DOB: 13-Jun-1950  PSC #: Ronni Rumble Facility/Room: 132 B Level Of Care: SNF Provider: Merrilee Seashore D Emergency Contacts: Extended Emergency Contact Information Primary Emergency Contact: Simmons,Beatrice Address: [address]          [place], [place] [address] D[place] of [place] Home Phone: [phone] Relation: Mother  Code Status: FULL  Allergies: Other  Chief Complaint  Patient presents with  . Acute Visit  . Medical Managment of Chronic Issues    HPI: Patient is 62 y.o. male who weighed 221 pounds on 8/15 and now weighs 162 pounds. Pt says he is not hungry. He does have some nausea, especially with greasy foods and he admits he has vomited twice in the past 2 weeks. He denies that his abd hurts or that he feels like he did when he was admitted for SBO. Pt says he used to like spaghetti, hot dogs and hamburgers and now can't stand the smell of them. He denies pain anywhere, any SOB, difficulty with urination of having a BM. No FH of CA of any type. He denies IVD use. He is homosexual but denies high risk behavoirs or relations with a known HIV+ person.Former smoker.He denies depression or sadness.  Past Medical History  Diagnosis Date  . Diabetes mellitus without complication   . CHF (congestive heart failure)   . Hypertension   . Cardiomyopathy, dilated   . Small bowel obstruction 10/2012  . COPD (chronic obstructive pulmonary disease)     Past Surgical History  Procedure Laterality Date  . Laparoscopic incisional / umbilical / ventral hernia repair        Medication List       This list is accurate as of: 11/23/12  1:09 PM.  Always use your most recent med list.               allopurinol 100 MG tablet  Commonly known as:  ZYLOPRIM  Take 1 tablet (100 mg total) by mouth daily.     amiodarone 200 MG tablet  Commonly known as:  PACERONE  Take 1 tablet (200 mg total) by mouth  daily.     aspirin EC 81 MG tablet  Take 81 mg by mouth daily.     cholecalciferol 1000 UNITS tablet  Commonly known as:  VITAMIN D  Take 1,000 Units by mouth daily.     colchicine 0.6 MG tablet  Take 1 tablet (0.6 mg total) by mouth daily.     digoxin 0.125 MG tablet  Commonly known as:  LANOXIN  Take 1 tablet (0.125 mg total) by mouth daily.     ipratropium-albuterol 0.5-2.5 (3) MG/3ML Soln  Commonly known as:  DUONEB  Take 3 mLs by nebulization 4 (four) times daily.     ipratropium-albuterol 0.5-2.5 (3) MG/3ML Soln  Commonly known as:  DUONEB  Take 3 mLs by nebulization every 3 (three) hours as needed (for wheezing /shortness of breath).     isosorbide-hydrALAZINE 20-37.5 MG per tablet  Commonly known as:  BIDIL  Take 1 tablet by mouth 3 (three) times daily.     metoprolol tartrate 12.5 mg Tabs tablet  Commonly known as:  LOPRESSOR  Take 0.5 tablets (12.5 mg total) by mouth 2 (two) times daily.     multivitamin with minerals Tabs tablet  Take 1 tablet by mouth daily.     omeprazole 40 MG capsule  Commonly known as:  PRILOSEC  Take  40 mg by mouth daily.     potassium chloride SA 20 MEQ tablet  Commonly known as:  K-DUR,KLOR-CON  Take 40 mEq by mouth daily.     torsemide 20 MG tablet  Commonly known as:  DEMADEX  Take 2 tablets (40 mg total) by mouth 2 (two) times daily.        No orders of the defined types were placed in this encounter.     There is no immunization history on file for this patient.  History  Substance Use Topics  . Smoking status: Former Smoker -- 0.25 packs/day for .5 years    Types: Cigarettes    Quit date: 09/28/2012  . Smokeless tobacco: Not on file  . Alcohol Use: No    Family history is neg for CA  Review of Systems  DATA OBTAINED: from patient, nurse as per HPI GENERAL: Feels well no fevers, fatigue, no appetite SKIN: No itching, rash or wounds EYES: No eye pain, redness, discharge EARS: No earache, tinnitus, change  in hearing NOSE: No congestion, drainage or bleeding  MOUTH/THROAT: No mouth or tooth pain, No sore throat, No difficulty chewing or swallowing  RESPIRATORY: No cough, wheezing, SOB CARDIAC: No chest pain, palpitations, lower extremity edema  GI: No abdominal pain, some nausea, no diarrhea or constipation, No heartburn or reflux  GU: No dysuria, frequency or urgency, or incontinence  MUSCULOSKELETAL: No unrelieved bone/joint pain NEUROLOGIC: no c/o HA, focal weakness, odd sensations PSYCHIATRIC: No overt anxiety or sadness. Sleeps well. No behavior issue.     Filed Vitals:   11/23/12 1115  BP: 115/87  Pulse: 105  Resp: 18    Physical Exam  GENERAL APPEARANCE: Alert, conversant. Appropriately groomed. No acute distress.  SKIN: No diaphoresis rash, or wounds HEAD: Normocephalic, atraumatic  EYES: Conjunctiva/lids clear. Pupils round, reactive. EOMs intact.  EARS: External exam WNL, canals clear. Hearing grossly normal.  NOSE: No deformity or discharge.  MOUTH/THROAT: Lips w/o lesions. Mouth and throat normal. Tongue moist, w/o lesion.  NECK: No thyroid tenderness, enlargement or nodule  RESPIRATORY: Breathing is even, unlabored. Lung sounds are clear   CARDIOVASCULAR: Heart RRR no murmurs, rubs or gallops. No peripheral edema.  ARTERIAL: radial pulse 2+, DP pulse 1+  VENOUS: No varicosities. No venous stasis skin changes  GASTROINTESTINAL: Abdomen is soft, non-tender, not distended w/ normal bowel sounds. No mass, ventral or inguinal hernia, minimal RUQ TTP GENITOURINARY: Bladder non tender, not distended  MUSCULOSKELETAL: No abnormal joints or musculature NEUROLOGIC: Oriented X3. Cranial nerves 2-12 grossly intact. Moves all extremities no tremor. PSYCHIATRIC: Mood and affect appropriate to situation, no behavioral issues  Patient Active Problem List   Diagnosis Date Noted  . COPD (chronic obstructive pulmonary disease)   . Unspecified protein-calorie malnutrition 10/26/2012   . Herpes zoster 10/26/2012  . HCAP (healthcare-associated pneumonia) 10/07/2012  . Nausea and vomiting 10/07/2012  . Acute kidney failure 10/07/2012  . Small bowel obstruction 10/07/2012  . Nonsustained ventricular tachycardia 09/17/2012  . Abnormal transaminases 09/17/2012  . Hypercapnia 09/17/2012  . Shock 09/17/2012  . Edema of left upper extremity 09/17/2012  . Erythema left hand 09/17/2012  . Acute respiratory failure with hypoxia 09/12/2012  . Diarrhea 09/11/2012  . Hypotension, unspecified 09/11/2012  . Thrombocytopenia, unspecified 09/11/2012  . Cellulitis of extremity 09/10/2012  . Acute systolic congestive heart failure, NYHA class 4   . Hypertension   . Diabetes mellitus without complication    CT abd/pelvis 10/08/2012-SBO and bilateral lower lobe opacity c/w atelectasis and RML  opacity; all other structures nl Renal U/S- 8/8- no hydro, slightly echogenic L renal parenchyma Did recommend CT enterography as out pt  CBC    Component Value Date/Time   WBC 9.0 10/12/2012 0502   RBC 4.86 10/12/2012 0502   HGB 12.5* 10/12/2012 0502   HCT 37.9* 10/12/2012 0502   PLT 85* 10/12/2012 0502   MCV 78.0 10/12/2012 0502   LYMPHSABS 2.1 10/06/2012 2305   MONOABS 0.8 10/06/2012 2305   EOSABS 0.0 10/06/2012 2305   BASOSABS 0.0 10/06/2012 2305    CMP     Component Value Date/Time   NA 139 10/13/2012 0400   K 4.5 10/13/2012 0400   CL 106 10/13/2012 0400   CO2 27 10/13/2012 0400   GLUCOSE 99 10/13/2012 0400   BUN 37* 10/13/2012 0400   CREATININE 1.38* 10/13/2012 0400   CALCIUM 8.4 10/13/2012 0400   PROT 6.3 10/08/2012 0757   ALBUMIN 1.5* 10/08/2012 0757   AST 12 10/08/2012 0757   ALT 11 10/08/2012 0757   ALKPHOS 103 10/08/2012 0757   BILITOT 0.8 10/08/2012 0757   GFRNONAA 54* 10/13/2012 0400   GFRAA 62* 10/13/2012 0400    Assessment and Plan WEIGHT LOSS- - major worry is cancer, the question is where? Since he just had a basically normal abd CT;CXR was abnormal in setting of SBO but will look at  chest, L kidney echogenic; Pt's chronic problems have all been stable except I think we might see some inc BUN/Cr even though pt says he has been drinking; PLAN TO ORDER- HIV, TSH, ESR, PSA, U/A, CBC WITH DIFF, CMP, MG, CA, CXR pa and lat  Chronic problems- the above labs will address a few things to look at but pt's chronic problems have all been well controlled;pt has hx DM, he is on no meds, will order HbA1c and fasting lipid profile   Okie Jansson D, MD  60 minutes spent on exam and review of pt's chart anfd prior hospitalization

## 2012-11-23 NOTE — ED Notes (Signed)
Spoke with lab and they advised that additional urine will be needed to do additional labs - can not work off sample sent

## 2012-11-23 NOTE — Consult Note (Signed)
CARDIOLOGY CONSULT NOTE   Caleb Bauer MRN: [id number] DOB/AGE: Sep 06, 1950 62 y.o. Admit date: 11/23/2012  Referring Physician: Dr. Etta Grandchild   Primary Cardiologist: None  Reason for consultation:  Digoxin toxicity  HPI:  62 yo male with h/o systolic HF with EF 20-25%, HTN, COPD who presented to [facility] from NH for abnormal labs with creatinine increased to 5.5  from 1.3 and hyperkalemia of 5.8. Patient was also found to be borderline hypotensive and responded to 250 mg IV bolus. A digoxin level was measure at  5.1. Cardiology was subsequently consulted upon transfer to [facility] for digoxin toxicity and CHF.  On interview, patient denied any active chest pain/SOB. ECG showed typical digitalis tox pattern.   Review of systems: A review of 10 organ systems was done and is negative except as stated above in HPI  Past Medical History  Diagnosis Date  . Diabetes mellitus without complication   . CHF (congestive heart failure)   . Hypertension   . Cardiomyopathy, dilated   . Small bowel obstruction 10/2012  . COPD (chronic obstructive pulmonary disease)    Past Surgical History  Procedure Laterality Date  . Laparoscopic incisional / umbilical / ventral hernia repair     History   Social History  . Marital Status: Single    Spouse Name: N/A    Number of Children: N/A  . Years of Education: N/A   Occupational History  . Not on file.   Social History Main Topics  . Smoking status: Former Smoker -- 0.25 packs/day for .5 years    Types: Cigarettes    Quit date: 09/28/2012  . Smokeless tobacco: Not on file  . Alcohol Use: No  . Drug Use: No  . Sexual Activity: Not on file   Other Topics Concern  . Not on file   Social History Narrative  . No narrative on file    Family History  Problem Relation Age of Onset  . Cancer Neg Hx      Allergies  Allergen Reactions  . Other Nausea And Vomiting    Patient stated drug is "kerein"    Current Facility-Administered  Medications on File Prior to Encounter  Medication Dose Route Frequency Provider Last Rate Last Dose  . propofol (DIPRIVAN) 10 mg/mL bolus/IV push    Anesthesia Intra-op Garen Lah, CRNA   40 mg at 09/12/12 0000  . succinylcholine (ANECTINE) injection    Anesthesia Intra-op Garen Lah, CRNA   100 mg at 09/12/12 0000   Current Outpatient Prescriptions on File Prior to Encounter  Medication Sig Dispense Refill  . allopurinol (ZYLOPRIM) 100 MG tablet Take 1 tablet (100 mg total) by mouth daily.  30 tablet  3  . amiodarone (PACERONE) 200 MG tablet Take 1 tablet (200 mg total) by mouth daily.  30 tablet  0  . aspirin EC 81 MG tablet Take 81 mg by mouth daily.      . cholecalciferol (VITAMIN D) 1000 UNITS tablet Take 1,000 Units by mouth daily.      . colchicine 0.6 MG tablet Take 1 tablet (0.6 mg total) by mouth daily.  30 tablet  3  . digoxin (LANOXIN) 0.125 MG tablet Take 1 tablet (0.125 mg total) by mouth daily.  30 tablet  10  . ipratropium-albuterol (DUONEB) 0.5-2.5 (3) MG/3ML SOLN Take 3 mLs by nebulization 4 (four) times daily.      Marland Kitchen ipratropium-albuterol (DUONEB) 0.5-2.5 (3) MG/3ML SOLN Take 3 mLs by nebulization every 3 (three)  hours as needed (for wheezing /shortness of breath).      . isosorbide-hydrALAZINE (BIDIL) 20-37.5 MG per tablet Take 1 tablet by mouth 3 (three) times daily.  120 tablet  10  . Multiple Vitamin (MULTIVITAMIN WITH MINERALS) TABS tablet Take 2 tablets by mouth daily.       Marland Kitchen omeprazole (PRILOSEC) 40 MG capsule Take 40 mg by mouth daily.      . potassium chloride SA (K-DUR,KLOR-CON) 20 MEQ tablet Take 40 mEq by mouth daily.      Marland Kitchen torsemide (DEMADEX) 20 MG tablet Take 2 tablets (40 mg total) by mouth 2 (two) times daily.  120 tablet  2    Physical Exam: Blood pressure 117/61, pulse 98, temperature 97.8 F (36.6 C), temperature source Oral, resp. rate 14, SpO2 96.00%.; There is no weight on file to calculate BMI. Temp:  [97.8 F (36.6 C)] 97.8 F (36.6 C)  (09/23 2021) Pulse Rate:  [98-108] 98 (09/23 2230) Resp:  [14-18] 14 (09/23 2021) BP: (93-117)/(61-87) 117/61 mmHg (09/23 2230) SpO2:  [93 %-96 %] 96 % (09/23 2230)  No intake or output data in the 24 hours ending 11/23/12 2346 General: NAD Heent: MMM Neck: No JVD  CV: Nondisplaced PMI.  RRR, nl S1/S2, no S3/S4, no murmur. No carotid bruit   Lungs: Clear to auscultation bilaterally with normal respiratory effort Abdomen: Soft, nontender, nondistended Extremities: No clubbing or cyanosis.  Normal pedal pulses. No pedal edema Skin: Intact without lesions or rashes  Neurologic: Alert and oriented x 3, grossly nonfocal  Psych: Normal mood and affect    Labs: No results found for this basename: CKTOTAL, CKMB, TROPONINI,  in the last 72 hours Lab Results  Component Value Date   WBC 6.5 11/23/2012   HGB 16.3 11/23/2012   HCT 48.0 11/23/2012   MCV 76.5* 11/23/2012   PLT 116* 11/23/2012    Recent Labs Lab 11/23/12 2105 11/23/12 2121  NA 132* 137  K 5.8* 5.8*  CL 93* 105  CO2 21  --   BUN 134* 129*  CREATININE 5.07* 5.50*  CALCIUM 8.9  --   PROT 8.5*  --   BILITOT 0.6  --   ALKPHOS 101  --   ALT 26  --   AST 39*  --   GLUCOSE 86 83   No results found for this basename: CHOL, HDL, LDLCALC, TRIG       EKG:  Sinus tachy with ST depression consistent with digitalis toxicity Radiology:  US Renal  11/23/2012   CLINICAL DATA:  Renal failure.  EXAM: RENAL/URINARY TRACT ULTRASOUND COMPLETE  COMPARISON:  Renal ultrasound 10/07/2012 and CT abdomen and pelvis 10/08/2012.  FINDINGS: Right Kidney  Length: Measures 10.9 cm. There is some increase in cortical echogenicity. No stone, mass or hydronephrosis.  Left Kidney  Length: Measures 11.1 cm There is some increase in cortical echogenicity. No stone, mass or hydronephrosis.  Bladder: Bilateral ureteral jets are identified. No abnormality is seen.  IMPRESSION: Negative for hydronephrosis or acute abnormality.  Mildly increased cortical  echogenicity compatible with medical renal disease.   Electronically Signed   By: Drusilla Kanner M.D.   On: 11/23/2012 23:14    ASSESSMENT: 62 yo M with h/o systolic HF is seen in consultation as requested by hospitalist service for:  1. Digoxin toxicity 2. Hyperkalemia 2/2 digoxin toxicity 3. Acute renal failure likely 2/2 digoxin toxicity 4. Systolic HF   PLAN:  1. S/p Digibind, recheck K. 2. Hold all AV nodal agents and  Amio now for bradyarrhythmia precaution and borderline hypotension 3. Close tele monitor for cardiac arrhythmia 4. Gentle IVF if hypotensive. 5. Avoid agent to lower K for now due to potential hypokalemia by Digibind.   Thank you for this consultation.  Will continue to follow.  Signed: Haydee Salter, MD Cardiology Fellow 11/23/2012, 11:46 PM

## 2012-11-24 DIAGNOSIS — E875 Hyperkalemia: Secondary | ICD-10-CM

## 2012-11-24 LAB — GLUCOSE, CAPILLARY
Glucose-Capillary: 79 mg/dL (ref 70–99)
Glucose-Capillary: 98 mg/dL (ref 70–99)
Glucose-Capillary: 86 mg/dL (ref 70–99)
Glucose-Capillary: 78 mg/dL (ref 70–99)

## 2012-11-24 LAB — CBC WITH DIFFERENTIAL/PLATELET
Basophils Absolute: 0 10*3/uL (ref 0.0–0.1)
Eosinophils Absolute: 0.3 10*3/uL (ref 0.0–0.7)
Eosinophils Relative: 6 % — ABNORMAL HIGH (ref 0–5)
Hemoglobin: 13.9 g/dL (ref 13.0–17.0)
Lymphocytes Relative: 47 % — ABNORMAL HIGH (ref 12–46)
MCH: 25.9 pg — ABNORMAL LOW (ref 26.0–34.0)
Neutro Abs: 1.9 10*3/uL (ref 1.7–7.7)
Neutrophils Relative %: 36 % — ABNORMAL LOW (ref 43–77)
Platelets: 123 10*3/uL — ABNORMAL LOW (ref 150–400)
RBC: 5.37 MIL/uL (ref 4.22–5.81)
RDW: 17.4 % — ABNORMAL HIGH (ref 11.5–15.5)
WBC: 5.3 10*3/uL (ref 4.0–10.5)

## 2012-11-24 LAB — NA AND K (SODIUM & POTASSIUM), RAND UR: Potassium Urine: 57 mEq/L

## 2012-11-24 LAB — DIGOXIN LEVEL: Digoxin Level: 4.9 ng/mL (ref 0.8–2.0)

## 2012-11-24 LAB — HEPATIC FUNCTION PANEL
Albumin: 2.6 g/dL — ABNORMAL LOW (ref 3.5–5.2)
Bilirubin, Direct: 0.2 mg/dL (ref 0.0–0.3)
Indirect Bilirubin: 0.4 mg/dL (ref 0.3–0.9)
Total Bilirubin: 0.6 mg/dL (ref 0.3–1.2)
Total Protein: 7.9 g/dL (ref 6.0–8.3)

## 2012-11-24 LAB — BASIC METABOLIC PANEL
BUN: 133 mg/dL — ABNORMAL HIGH (ref 6–23)
CO2: 21 mEq/L (ref 19–32)
Calcium: 8.6 mg/dL (ref 8.4–10.5)
Chloride: 101 mEq/L (ref 96–112)
GFR calc Af Amer: 13 mL/min — ABNORMAL LOW (ref >90)
Glucose, Bld: 77 mg/dL (ref 70–99)
Potassium: 5 mEq/L (ref 3.5–5.1)
Sodium: 142 mEq/L (ref 135–145)

## 2012-11-24 LAB — PHOSPHORUS: Phosphorus: 4.4 mg/dL (ref 2.3–4.6)

## 2012-11-24 LAB — CREATININE, URINE, RANDOM: Creatinine, Urine: 98.12 mg/dL

## 2012-11-24 LAB — CLOSTRIDIUM DIFFICILE BY PCR: Toxigenic C. Difficile by PCR: NEGATIVE

## 2012-11-24 MED ORDER — ACETAMINOPHEN 650 MG RE SUPP: 650.0000 mg | Freq: Four times a day (QID) | RECTAL | Status: DC | PRN

## 2012-11-24 MED ORDER — DEXTROSE 5 % IV SOLN
1.0000 g | INTRAVENOUS | Status: DC
  Administered 2012-11-24: 1 g via INTRAVENOUS
  Filled 2012-11-24 (×2): qty 10

## 2012-11-24 MED ORDER — SODIUM CHLORIDE 0.9 % IJ SOLN
3.0000 mL | Freq: Two times a day (BID) | INTRAMUSCULAR | Status: DC
  Administered 2012-11-24 – 2012-12-02 (×7): 3 mL via INTRAVENOUS

## 2012-11-24 MED ORDER — SODIUM CHLORIDE 0.9 % IV SOLN
INTRAVENOUS | Status: AC
  Administered 2012-11-24: 500 mL/h via INTRAVENOUS

## 2012-11-24 MED ORDER — ACETAMINOPHEN 325 MG PO TABS: 650.0000 mg | ORAL_TABLET | Freq: Four times a day (QID) | ORAL | Status: DC | PRN

## 2012-11-24 MED ORDER — ONDANSETRON HCL 4 MG/2ML IJ SOLN
4.0000 mg | Freq: Four times a day (QID) | INTRAMUSCULAR | Status: DC | PRN
  Administered 2012-11-26: 4 mg via INTRAVENOUS
  Filled 2012-11-24: qty 2

## 2012-11-24 MED ORDER — ONDANSETRON HCL 4 MG PO TABS: 4.0000 mg | ORAL_TABLET | Freq: Four times a day (QID) | ORAL | Status: DC | PRN

## 2012-11-24 MED ORDER — SODIUM CHLORIDE 0.9 % IJ SOLN
3.0000 mL | Freq: Two times a day (BID) | INTRAMUSCULAR | Status: DC
  Administered 2012-11-24: 3 mL via INTRAVENOUS

## 2012-11-24 MED ORDER — SODIUM CHLORIDE 0.9 % IV SOLN
INTRAVENOUS | Status: DC
  Administered 2012-11-24: 125 mL/h via INTRAVENOUS
  Administered 2012-11-24: 20:00:00 via INTRAVENOUS
  Administered 2012-11-24: 75 mL/h via INTRAVENOUS
  Administered 2012-11-25: 04:00:00 via INTRAVENOUS
  Administered 2012-11-25: 125 mL/h via INTRAVENOUS
  Administered 2012-11-25 – 2012-11-28 (×4): via INTRAVENOUS
  Administered 2012-11-28: 20 mL/h via INTRAVENOUS

## 2012-11-24 MED ORDER — INSULIN ASPART 100 UNIT/ML ~~LOC~~ SOLN: 0.0000 [IU] | Freq: Three times a day (TID) | SUBCUTANEOUS | Status: DC

## 2012-11-24 MED ORDER — PNEUMOCOCCAL VAC POLYVALENT 25 MCG/0.5ML IJ INJ: 0.5000 mL | INJECTION | INTRAMUSCULAR | Status: DC | PRN

## 2012-11-24 NOTE — Progress Notes (Signed)
Pt transferred from [facility] for digoxin toxicity and ESRD in need of dialysis. Cardio has seen and nephro is aware of him being here. BP soft, but has been at times. Will cont to watch for now as do not want to give a lot of IVF given his ESRD. Other VS stable, pt resting. Jimmye Norman, NP [facility]

## 2012-11-24 NOTE — Progress Notes (Signed)
Utilization review completed.  

## 2012-11-24 NOTE — Clinical Social Work Note (Signed)
Clinical Social Work Department BRIEF PSYCHOSOCIAL ASSESSMENT 11/24/2012  Patient:  Caleb Bauer, Caleb Bauer     Account Number:  [id number]     Admit date:  11/23/2012  Clinical Social Worker:  Hulan Fray  Date/Time:  11/24/2012 02:40 PM  Referred by:  CSW  Date Referred:  11/24/2012 Referred for  Other - See comment   Other Referral:   Admit from facility   Interview type:  Other - See comment Other interview type:   chart review    PSYCHOSOCIAL DATA Living Status:  FACILITY Admitted from facility:  [facility] [place] Level of care:  Skilled Nursing Facility Primary support name:  Lavell Islam Primary support relationship to patient:  FAMILY Degree of support available:   supportive    CURRENT CONCERNS Current Concerns  Post-Acute Placement   Other Concerns:    SOCIAL WORK ASSESSMENT / PLAN Clinical Social Worker received call from [facility] that patient was a short term resident of theirs. CSW reviewed chart and noticed previous CSW confirmed patient was from facility and plans to return at discharge. CSW will contact facility and complete FL2 for MD's signature.   Assessment/plan status:  Psychosocial Support/Ongoing Assessment of Needs Other assessment/ plan:   Information/referral to community resources:   Patient is from facility    PATIENT'S/FAMILY'S RESPONSE TO PLAN OF CARE: Per chart review, patient reported that he planned to return back to [facility] SNF when medically stable.

## 2012-11-24 NOTE — ED Notes (Signed)
Report given to [facility]  

## 2012-11-24 NOTE — Progress Notes (Signed)
[facility] PROGRESS NOTE  Caleb Bauer AVW:[id number] DOB: 1950/09/16 DOA: 11/23/2012 PCP: Bufford Spikes, DO  Assessment/Plan: 62 yo male with h/o systolic HF with EF 20-25%, HTN, COPD, recent h/o sepsis who presented to [facility] from NH for abnormal labs (creatinine increased to 5.5 from 1.3 and hyperkalemia of 5.8) admitted with AKI, hypotension, digoxin toxicity, UTI     1. Digoxin toxicity s/p digibind 9/23; pend labs -K today;  -cardiology following; holding AV nodal agents and amio; cont close monitoring;   2. Hypotension likely hypovolemic due to diarrhea +/-over diuresis  -clinically hypovolemic; IVF gentle; avoid fluid overload due to severe HF LVEF 25% (08/2012)  3. AKI, hyper K on CKD III likely prerenal; with dig effect +K tabs; s/p CA gluconate, kayexalate, +insulin on 9/23 -monitor renal function; gentle IVF; monitor for s/s of HF due to LV dysfunction   4. CHF systolic HF with LVEF 25% (08/2012); cardiomyopathy; clinically hypovolemic;  -gentle IVF; close monitor; BB, NTG, hydralazine on hold; hold diuretic   5. Diarrhea ? Colchicine related; vs viral gastroenteritis; afebrile;  -cont supportive care; IVF; hold colchicine    6. COPD; no s/s of exacerbation; cont prn bronchodilators   7. Possible UTI; afebrile; on atx; monitor   8. DM stable; last HA1C-6.2; cont ISS 9. Chronic thrombocytopenia; no s/s of acute bleeding; monitor      Code Status: full  Family Communication: niece at the bedside  (indicate person spoken with, relationship, and if by phone, the number) Disposition Plan: SNF   Consultants:  Cardiology, nephrology   Procedures:  No   Antibiotics:  Ceftriaxone 9/23<<< (indicate start date, and stop date if known)  HPI/Subjective: Feels better; no chest pain   Objective: Filed Vitals:   11/24/12 0700  BP: 103/64  Pulse: 58  Temp:   Resp: 15   No intake or output data in the 24 hours ending 11/24/12 0738 Filed Weights   11/24/12  0330  Weight: 73.5 kg (162 lb 0.6 oz)    Exam:   General:  Alert, awake   Cardiovascular: S1, S2 tachy   Respiratory: CTA BL   Abdomen: soft, NT, ND   Musculoskeletal: no LE edema    Data Reviewed: Basic Metabolic Panel:  Recent Labs Lab 11/23/12 2105 11/23/12 2121 11/24/12 0530  NA 132* 137 142  K 5.8* 5.8* 5.0  CL 93* 105 101  CO2 21  --  21  GLUCOSE 86 83 77  BUN 134* 129* 133*  CREATININE 5.07* 5.50* 5.02*  CALCIUM 8.9  --  8.6  PHOS  --   --  4.4   Liver Function Tests:  Recent Labs Lab 11/23/12 2105 11/24/12 0530  AST 39* 46*  ALT 26 28  ALKPHOS 101 95  BILITOT 0.6 0.6  PROT 8.5* 7.9  ALBUMIN 2.6* 2.6*   No results found for this basename: LIPASE, AMYLASE,  in the last 168 hours No results found for this basename: AMMONIA,  in the last 168 hours CBC:  Recent Labs Lab 11/23/12 2105 11/23/12 2121 11/24/12 0530  WBC 6.5  --  5.3  NEUTROABS 2.5  --  1.9  HGB 14.3 16.3 13.9  HCT 43.0 48.0 41.1  MCV 76.5*  --  76.5*  PLT 116*  --  123*   Cardiac Enzymes: No results found for this basename: CKTOTAL, CKMB, CKMBINDEX, TROPONINI,  in the last 168 hours BNP (last 3 results)  Recent Labs  09/25/12 0450 09/26/12 0530 10/06/12 2305  PROBNP 26983.0* 34516.0* 33980.0*  CBG:  Recent Labs Lab 11/24/12 0239  GLUCAP 79    No results found for this or any previous visit (from the past 240 hour(s)).   Studies: US Renal  11/23/2012   CLINICAL DATA:  Renal failure.  EXAM: RENAL/URINARY TRACT ULTRASOUND COMPLETE  COMPARISON:  Renal ultrasound 10/07/2012 and CT abdomen and pelvis 10/08/2012.  FINDINGS: Right Kidney  Length: Measures 10.9 cm. There is some increase in cortical echogenicity. No stone, mass or hydronephrosis.  Left Kidney  Length: Measures 11.1 cm There is some increase in cortical echogenicity. No stone, mass or hydronephrosis.  Bladder: Bilateral ureteral jets are identified. No abnormality is seen.  IMPRESSION: Negative for  hydronephrosis or acute abnormality.  Mildly increased cortical echogenicity compatible with medical renal disease.   Electronically Signed   By: Drusilla Kanner M.D.   On: 11/23/2012 23:14    Scheduled Meds: . cefTRIAXone (ROCEPHIN)  IV  1 g Intravenous Q24H  . insulin aspart  0-9 Units Subcutaneous TID WC  . sodium chloride  3 mL Intravenous Q12H  . sodium chloride  3 mL Intravenous Q12H   Continuous Infusions:   Principal Problem:   Digoxin toxicity Active Problems:   Thrombocytopenia, unspecified   Acute renal failure   Cardiomyopathy    Time spent: > 35 minutes     Esperanza Sheets  [facility] Pager [phone]. If 7PM-7AM, please contact night-coverage at [email or web address], password [facility] 11/24/2012, 7:38 AM  LOS: 1 day

## 2012-11-24 NOTE — Progress Notes (Signed)
INITIAL NUTRITION ASSESSMENT  DOCUMENTATION CODES Per approved criteria  -Severe malnutrition in the context of acute illness or injury   INTERVENTION: No nutrition intervention at this time ---> patient declined RD to follow for nutrition care plan  NUTRITION DIAGNOSIS: Inadequate oral intake related to decreased appetite as evidenced by patient report  Goal: Pt to meet >/= 90% of their estimated nutrition needs   Monitor:  PO intake, weight, labs, I/O's  Reason for Assessment: Malnutrition Screening Tool Report  62 y.o. male  Admitting Dx: Digoxin toxicity  ASSESSMENT: Patient with PMH of COPD, DM, HTN and CHF; in ER patient's labs were found to be abnormal presenting of acute on chronic renal failure; digoxin levels were also found to be elevated; patient admitted for further workup.  Patient reports his appetite is "so-so;" he states he hasn't been eating well for the last few weeks; he tell me he was eating little amounts of solids (vs liquids); per weight records, he's had severe weight loss of 26% x 6 weeks (? mixed etiology; was hospitalized in July 2014 with sepsis); declined addition of nutrition supplements.  Patient meets criteria for severe malnutrition in the context of acute illness or injury as evidenced by < 50% intake of estimated energy requirement for > 5 days and 26% weight loss x 6 weeks.  Height: Ht Readings from Last 1 Encounters:  11/24/12 6\' 2"  (1.88 m)    Weight: Wt Readings from Last 1 Encounters:  11/24/12 162 lb 0.6 oz (73.5 kg)    Ideal Body Weight: 190 lb  % Ideal Body Weight: 85%  Wt Readings from Last 10 Encounters:  11/24/12 162 lb 0.6 oz (73.5 kg)  10/13/12 220 lb 14.4 oz (100.2 kg)  09/27/12 252 lb 10.4 oz (114.6 kg)    Usual Body Weight: 220 lb  % Usual Body Weight: 74%  BMI:  Body mass index is 20.8 kg/(m^2).  Estimated Nutritional Needs: Kcal: 2000-2200 Protein: 100-110 gm Fluid: 2.0-2.2 L  Skin: Intact  Diet  Order: Cardiac  EDUCATION NEEDS: -No education needs identified at this time  Labs:   Recent Labs Lab 11/23/12 2105 11/23/12 2121 11/24/12 0530  NA 132* 137 142  K 5.8* 5.8* 5.0  CL 93* 105 101  CO2 21  --  21  BUN 134* 129* 133*  CREATININE 5.07* 5.50* 5.02*  CALCIUM 8.9  --  8.6  PHOS  --   --  4.4  GLUCOSE 86 83 77    CBG (last 3)   Recent Labs  11/24/12 0239 11/24/12 0314 11/24/12 0835  GLUCAP 79 98 86    Scheduled Meds: . cefTRIAXone (ROCEPHIN)  IV  1 g Intravenous Q24H  . insulin aspart  0-9 Units Subcutaneous TID WC  . sodium chloride  3 mL Intravenous Q12H  . sodium chloride  3 mL Intravenous Q12H    Continuous Infusions: . sodium chloride 75 mL/hr (11/24/12 0810)    Past Medical History  Diagnosis Date  . Diabetes mellitus without complication   . CHF (congestive heart failure)   . Hypertension   . Cardiomyopathy, dilated   . Small bowel obstruction 10/2012  . COPD (chronic obstructive pulmonary disease)     Past Surgical History  Procedure Laterality Date  . Laparoscopic incisional / umbilical / ventral hernia repair      Maureen Chatters, RD, LDN Pager #: [phone] After-Hours Pager #: [phone]

## 2012-11-24 NOTE — Consult Note (Signed)
Reason for Consult: ARF Referring Physician: Dr. York Spaniel   HPI:  Caleb Bauer is a 62 y.o. male with PMH CHF (EF 20-25 per July 2014 echo), CKD (approx baseline 1.7-2), DM, and HTN who was transferred to [facility] from [facility] for acute on chronic renal failure after abnormal labs were obtained at his SNF [facility] Of note, the patient was recently admitted in July for acute on chronic CHF with probable underlying CKD and was intubated for sepsis as well. He was again seen in August for pSBO with N/V with bile for 4 weeks and was found to be in ARF at that time as well. He was also recently treated with a 7 day course of Acyclovir for shingles on his back on 8/26. He is on digoxin currently for his CHF and his levels were elevated in the ER (5.1) this admission for which he received digifab x 1 with minimal lowering (4.9).  He reports that he has had a severely decreased appetite lately for at least 2 weeks and has progressively felt bad. He drinks as much water as he can but has had no desire for food lately eating only approx 25% of his meals at his SNF. His abnormal labs at SNF were significant for K of 6, BUN 135, and SCr 4.95. He has been hypotensive since admission despite gentle IVF resuscitation. He denies fevers, chills, headaches, burning or pain with urination. He states occasional diarrhea.  He has had 3 episodes of AKI in the last 3 months.. He was not feeling poorly above and beyond his usual feeling.    Past Medical History  Diagnosis Date  . Diabetes mellitus without complication   . CHF (congestive heart failure)   . Hypertension   . Cardiomyopathy, dilated   . Small bowel obstruction 10/2012  . COPD (chronic obstructive pulmonary disease)     Past Surgical History  Procedure Laterality Date  . Laparoscopic incisional / umbilical / ventral hernia repair      Family History  Problem Relation Age of Onset  . Cancer Neg Hx   - No known FH kidney disease  Social History:   reports that he quit smoking about 8 weeks ago. His smoking use included Cigarettes. He has a .125 pack-year smoking history. He does not have any smokeless tobacco history on file. He reports that he does not drink alcohol or use illicit drugs.  Allergies:  Allergies  Allergen Reactions  . Other Nausea And Vomiting    Patient stated drug is "kerein"    Medications:  I have reviewed the patient's current medications. Prior to Admission:  Prescriptions prior to admission  Medication Sig Dispense Refill  . allopurinol (ZYLOPRIM) 100 MG tablet Take 1 tablet (100 mg total) by mouth daily.  30 tablet  3  . amiodarone (PACERONE) 200 MG tablet Take 1 tablet (200 mg total) by mouth daily.  30 tablet  0  . aspirin EC 81 MG tablet Take 81 mg by mouth daily.      . cholecalciferol (VITAMIN D) 1000 UNITS tablet Take 1,000 Units by mouth daily.      . colchicine 0.6 MG tablet Take 1 tablet (0.6 mg total) by mouth daily.  30 tablet  3  . digoxin (LANOXIN) 0.125 MG tablet Take 1 tablet (0.125 mg total) by mouth daily.  30 tablet  10  . feeding supplement (PRO-STAT SUGAR FREE 64) LIQD Take 30 mLs by mouth 3 (three) times daily with meals.      Marland Kitchen  HYDROcodone-acetaminophen (NORCO/VICODIN) 5-325 MG per tablet Take 1 tablet by mouth every 6 (six) hours as needed for pain.      Marland Kitchen ipratropium-albuterol (DUONEB) 0.5-2.5 (3) MG/3ML SOLN Take 3 mLs by nebulization 4 (four) times daily.      Marland Kitchen ipratropium-albuterol (DUONEB) 0.5-2.5 (3) MG/3ML SOLN Take 3 mLs by nebulization every 3 (three) hours as needed (for wheezing /shortness of breath).      . isosorbide-hydrALAZINE (BIDIL) 20-37.5 MG per tablet Take 1 tablet by mouth 3 (three) times daily.  120 tablet  10  . metoprolol tartrate (LOPRESSOR) 25 MG tablet Take 6.25 mg by mouth 2 (two) times daily. Hold if pulse < 50; SBP < 100      . mirtazapine (REMERON) 7.5 MG tablet Take 7.5 mg by mouth at bedtime.      . Multiple Vitamin (MULTIVITAMIN WITH MINERALS) TABS  tablet Take 2 tablets by mouth daily.       Marland Kitchen omeprazole (PRILOSEC) 40 MG capsule Take 40 mg by mouth daily.      . potassium chloride SA (K-DUR,KLOR-CON) 20 MEQ tablet Take 40 mEq by mouth daily.      Marland Kitchen torsemide (DEMADEX) 20 MG tablet Take 2 tablets (40 mg total) by mouth 2 (two) times daily.  120 tablet  2   Scheduled: . cefTRIAXone (ROCEPHIN)  IV  1 g Intravenous Q24H  . insulin aspart  0-9 Units Subcutaneous TID WC  . sodium chloride  3 mL Intravenous Q12H  . sodium chloride  3 mL Intravenous Q12H     Results for orders placed during the hospital encounter of 11/23/12 (from the past 48 hour(s))  URINALYSIS, ROUTINE W REFLEX MICROSCOPIC     Status: Abnormal   Collection Time    11/23/12  9:04 PM      Result Value Range   Color, Urine YELLOW  YELLOW   APPearance TURBID (*) CLEAR   Specific Gravity, Urine 1.023  1.005 - 1.030   pH 5.0  5.0 - 8.0   Glucose, UA NEGATIVE  NEGATIVE mg/dL   Hgb urine dipstick MODERATE (*) NEGATIVE   Bilirubin Urine SMALL (*) NEGATIVE   Ketones, ur NEGATIVE  NEGATIVE mg/dL   Protein, ur 161 (*) NEGATIVE mg/dL   Urobilinogen, UA 0.2  0.0 - 1.0 mg/dL   Nitrite POSITIVE (*) NEGATIVE   Leukocytes, UA LARGE (*) NEGATIVE  URINE MICROSCOPIC-ADD ON     Status: Abnormal   Collection Time    11/23/12  9:04 PM      Result Value Range   Squamous Epithelial / LPF FEW (*) RARE   WBC, UA TOO NUMEROUS TO COUNT  <3 WBC/hpf   Comment: WITH CLUMPS   RBC / HPF FIELD OBSCURED BY WBC'S  <3 RBC/hpf   Bacteria, UA MANY (*) RARE   Urine-Other MICROSCOPIC EXAM PERFORMED ON UNCONCENTRATED URINE    COMPREHENSIVE METABOLIC PANEL     Status: Abnormal   Collection Time    11/23/12  9:05 PM      Result Value Range   Sodium 132 (*) 135 - 145 mEq/L   Potassium 5.8 (*) 3.5 - 5.1 mEq/L   Chloride 93 (*) 96 - 112 mEq/L   CO2 21  19 - 32 mEq/L   Glucose, Bld 86  70 - 99 mg/dL   BUN 096 (*) 6 - 23 mg/dL   Creatinine, Ser 0.45 (*) 0.50 - 1.35 mg/dL   Calcium 8.9  8.4 - 40.9  mg/dL   Total Protein 8.5 (*)  6.0 - 8.3 g/dL   Albumin 2.6 (*) 3.5 - 5.2 g/dL   AST 39 (*) 0 - 37 U/L   ALT 26  0 - 53 U/L   Alkaline Phosphatase 101  39 - 117 U/L   Total Bilirubin 0.6  0.3 - 1.2 mg/dL   GFR calc non Af Amer 11 (*) >90 mL/min   GFR calc Af Amer 13 (*) >90 mL/min   Comment: (NOTE)     The eGFR has been calculated using the CKD EPI equation.     This calculation has not been validated in all clinical situations.     eGFR's persistently <90 mL/min signify possible Chronic Kidney     Disease.  CBC WITH DIFFERENTIAL     Status: Abnormal   Collection Time    11/23/12  9:05 PM      Result Value Range   WBC 6.5  4.0 - 10.5 K/uL   RBC 5.62  4.22 - 5.81 MIL/uL   Hemoglobin 14.3  13.0 - 17.0 g/dL   HCT 40.9  81.1 - 91.4 %   MCV 76.5 (*) 78.0 - 100.0 fL   MCH 25.4 (*) 26.0 - 34.0 pg   MCHC 33.3  30.0 - 36.0 g/dL   RDW 78.2 (*) 95.6 - 21.3 %   Platelets 116 (*) 150 - 400 K/uL   Comment: REPEATED TO VERIFY     SPECIMEN CHECKED FOR CLOTS     PLATELET COUNT CONFIRMED BY SMEAR   Neutrophils Relative % 39 (*) 43 - 77 %   Lymphocytes Relative 47 (*) 12 - 46 %   Monocytes Relative 8  3 - 12 %   Eosinophils Relative 5  0 - 5 %   Basophils Relative 1  0 - 1 %   Neutro Abs 2.5  1.7 - 7.7 K/uL   Lymphs Abs 3.1  0.7 - 4.0 K/uL   Monocytes Absolute 0.5  0.1 - 1.0 K/uL   Eosinophils Absolute 0.3  0.0 - 0.7 K/uL   Basophils Absolute 0.1  0.0 - 0.1 K/uL   Smear Review LARGE PLATELETS PRESENT    DIGOXIN LEVEL     Status: Abnormal   Collection Time    11/23/12  9:05 PM      Result Value Range   Digoxin Level 5.1 (*) 0.8 - 2.0 ng/mL   Comment: RESULTS CONFIRMED BY MANUAL DILUTION     CRITICAL RESULT CALLED TO, READ BACK BY AND VERIFIED WITH:     V SWINTON AT 2256 ON 09.23.2014 BY NBROOKS  POCT I-STAT, CHEM 8     Status: Abnormal   Collection Time    11/23/12  9:21 PM      Result Value Range   Sodium 137  135 - 145 mEq/L   Potassium 5.8 (*) 3.5 - 5.1 mEq/L   Chloride 105  96 -  112 mEq/L   BUN 129 (*) 6 - 23 mg/dL   Creatinine, Ser 0.86 (*) 0.50 - 1.35 mg/dL   Glucose, Bld 83  70 - 99 mg/dL   Calcium, Ion 5.78 (*) 1.13 - 1.30 mmol/L   TCO2 23  0 - 100 mmol/L   Hemoglobin 16.3  13.0 - 17.0 g/dL   HCT 46.9  62.9 - 52.8 %  CG4 I-STAT (LACTIC ACID)     Status: None   Collection Time    11/23/12 10:23 PM      Result Value Range   Lactic Acid, Venous 1.57  0.5 - 2.2 mmol/L  BLOOD GAS, ARTERIAL     Status: None   Collection Time    11/23/12 11:03 PM      Result Value Range   FIO2 0.21     pH, Arterial 7.383  7.350 - 7.450   pCO2 arterial 39.0  35.0 - 45.0 mmHg   pO2, Arterial 92.3  80.0 - 100.0 mmHg   Bicarbonate 22.7  20.0 - 24.0 mEq/L   TCO2 20.3  0 - 100 mmol/L   Acid-base deficit 1.6  0.0 - 2.0 mmol/L   O2 Saturation 96.7     Patient temperature 37.0     Collection site RIGHT RADIAL     Drawn by [id number]     Sample type ARTERIAL DRAW     Allens test (pass/fail) PASS  PASS  POCT I-STAT TROPONIN I     Status: None   Collection Time    11/23/12 11:10 PM      Result Value Range   Troponin i, poc 0.06  0.00 - 0.08 ng/mL   Comment 3            Comment: Due to the release kinetics of cTnI,     a negative result within the first hours     of the onset of symptoms does not rule out     myocardial infarction with certainty.     If myocardial infarction is still suspected,     repeat the test at appropriate intervals.  GLUCOSE, CAPILLARY     Status: None   Collection Time    11/24/12  2:39 AM      Result Value Range   Glucose-Capillary 79  70 - 99 mg/dL  GLUCOSE, CAPILLARY     Status: None   Collection Time    11/24/12  3:14 AM      Result Value Range   Glucose-Capillary 98  70 - 99 mg/dL   Comment 1 Notify RN     Comment 2 Documented in Chart    NA AND K (SODIUM & POTASSIUM), RAND UR     Status: None   Collection Time    11/24/12  3:30 AM      Result Value Range   Sodium, Ur 35     Potassium Urine Timed 57    CREATININE, URINE, RANDOM      Status: None   Collection Time    11/24/12  3:30 AM      Result Value Range   Creatinine, Urine 98.12    CLOSTRIDIUM DIFFICILE BY PCR     Status: None   Collection Time    11/24/12  3:31 AM      Result Value Range   C difficile by pcr NEGATIVE  NEGATIVE  PHOSPHORUS     Status: None   Collection Time    11/24/12  5:30 AM      Result Value Range   Phosphorus 4.4  2.3 - 4.6 mg/dL  HEPATIC FUNCTION PANEL     Status: Abnormal   Collection Time    11/24/12  5:30 AM      Result Value Range   Total Protein 7.9  6.0 - 8.3 g/dL   Albumin 2.6 (*) 3.5 - 5.2 g/dL   AST 46 (*) 0 - 37 U/L   ALT 28  0 - 53 U/L   Alkaline Phosphatase 95  39 - 117 U/L   Total Bilirubin 0.6  0.3 - 1.2 mg/dL   Bilirubin, Direct 0.2  0.0 - 0.3 mg/dL  Comment: HEMOLYSIS AT THIS LEVEL MAY AFFECT RESULT   Indirect Bilirubin 0.4  0.3 - 0.9 mg/dL  DIGOXIN LEVEL     Status: Abnormal   Collection Time    11/24/12  5:30 AM      Result Value Range   Digoxin Level 4.9 (*) 0.8 - 2.0 ng/mL   Comment: RESULTS CONFIRMED BY MANUAL DILUTION     CRITICAL RESULT CALLED TO, READ BACK BY AND VERIFIED WITH:     DAWN FIELDS,RN AT 4098 11/24/12 BY ZBEECH.  CBC WITH DIFFERENTIAL     Status: Abnormal   Collection Time    11/24/12  5:30 AM      Result Value Range   WBC 5.3  4.0 - 10.5 K/uL   RBC 5.37  4.22 - 5.81 MIL/uL   Hemoglobin 13.9  13.0 - 17.0 g/dL   HCT 11.9  14.7 - 82.9 %   MCV 76.5 (*) 78.0 - 100.0 fL   MCH 25.9 (*) 26.0 - 34.0 pg   MCHC 33.8  30.0 - 36.0 g/dL   RDW 56.2 (*) 13.0 - 86.5 %   Platelets 123 (*) 150 - 400 K/uL   Neutrophils Relative % 36 (*) 43 - 77 %   Neutro Abs 1.9  1.7 - 7.7 K/uL   Lymphocytes Relative 47 (*) 12 - 46 %   Lymphs Abs 2.5  0.7 - 4.0 K/uL   Monocytes Relative 11  3 - 12 %   Monocytes Absolute 0.6  0.1 - 1.0 K/uL   Eosinophils Relative 6 (*) 0 - 5 %   Eosinophils Absolute 0.3  0.0 - 0.7 K/uL   Basophils Relative 1  0 - 1 %   Basophils Absolute 0.0  0.0 - 0.1 K/uL  BASIC METABOLIC  PANEL     Status: Abnormal   Collection Time    11/24/12  5:30 AM      Result Value Range   Sodium 142  135 - 145 mEq/L   Potassium 5.0  3.5 - 5.1 mEq/L   Chloride 101  96 - 112 mEq/L   CO2 21  19 - 32 mEq/L   Glucose, Bld 77  70 - 99 mg/dL   BUN 784 (*) 6 - 23 mg/dL   Creatinine, Ser 6.96 (*) 0.50 - 1.35 mg/dL   Calcium 8.6  8.4 - 29.5 mg/dL   GFR calc non Af Amer 11 (*) >90 mL/min   GFR calc Af Amer 13 (*) >90 mL/min   Comment: (NOTE)     The eGFR has been calculated using the CKD EPI equation.     This calculation has not been validated in all clinical situations.     eGFR's persistently <90 mL/min signify possible Chronic Kidney     Disease.  GLUCOSE, CAPILLARY     Status: None   Collection Time    11/24/12  8:35 AM      Result Value Range   Glucose-Capillary 86  70 - 99 mg/dL  GLUCOSE, CAPILLARY     Status: None   Collection Time    11/24/12 11:18 AM      Result Value Range   Glucose-Capillary 78  70 - 99 mg/dL   Comment 1 Documented in Chart     Comment 2 Notify RN      US Renal  11/23/2012   CLINICAL DATA:  Renal failure.  EXAM: RENAL/URINARY TRACT ULTRASOUND COMPLETE  COMPARISON:  Renal ultrasound 10/07/2012 and CT abdomen and pelvis 10/08/2012.  FINDINGS: Right Kidney  Length: Measures 10.9 cm. There is some increase in cortical echogenicity. No stone, mass or hydronephrosis.  Left Kidney  Length: Measures 11.1 cm There is some increase in cortical echogenicity. No stone, mass or hydronephrosis.  Bladder: Bilateral ureteral jets are identified. No abnormality is seen.  IMPRESSION: Negative for hydronephrosis or acute abnormality.  Mildly increased cortical echogenicity compatible with medical renal disease.   Electronically Signed   By: Drusilla Kanner M.D.   On: 11/23/2012 23:14    ROS - As per HPI  Blood pressure 87/36, pulse 58, temperature 98.2 F (36.8 C), temperature source Oral, resp. rate 13, height 6\' 2"  (1.88 m), weight 73.5 kg (162 lb 0.6 oz), SpO2  98.00%.  Physical Examination:  General appearance - alert, well appearing, and in no distress Eyes - pupils equal and reactive, extraocular eye movements intact Mouth - mucous membranes moist, pharynx normal without lesions Neck - supple, no significant adenopathy Lymphatics - no palpable lymphadenopathy, no hepatosplenomegaly Chest - clear to auscultation, no wheezes, rales or rhonchi, symmetric air entry Heart - normal rate, regular rhythm, normal S1, S2, no murmurs, rubs, clicks or gallops Abdomen - soft, nontender, nondistended, no masses or organomegaly Back exam - old dermatomal rash healing on left upper and lower back; dressings covering mid lower back and left thigh Extremities - peripheral pulses normal, no pedal edema, no clubbing or cyanosis, no asterixis Skin - left thigh with pressure ulcer dressing, lower back with dressing also  Assessment/Plan: 1. Acute on chronic kidney disease: Baseline not quite known- 2 previous episodes of AKI but with his h/o DM, HTN, and CHF, he most likely has component of CKD with baseline ~1.5-2, it was 1.38on 10/13/12.  He has had very poor PO intake and if epic weights are accurate his wt. change 09/27/12 (114kg) to current 73.5 kg is concerning for volume depletion first and foremost with concomitant antihypertensive medicationuse. Beyond this cause for his ARF, his differential includes drug induced nephropathy (acyclovir), vs hypertensive nephropathy vs cardiorenal syndrome.  - renal u/s negative for hydro or mass - continue to hold BP meds - NS bolus 500cc now, then increase rate to 125cc/hr and monitor volume status closely. He states he is making urine - avoid nephrotoxic agents.  I think most likely culprit is his hypotension on bidil/lopressor/dig/demedex- he also completed a recent course of ACV and has UTI 2. Hypertension - Treated with antihypertensives per SNF, currently hypotensive (70-80s systolic) - continue to hold BP meds - continue  IVF 3. UTI - Asymptomatic but UA on admission with nitrites, leuks, and bacteria - currently on rocephin - f/u urine culture 4. Hyperkalemia - Admitted with dig toxicity (5.1), s/p digifab and kayexalate >>4.9. Potassium level controlled for now, will continue to monitor.  - stop potassium supplement.  So far is better with medical therapy  Lewie Chamber 11/24/2012, 1:39 PM   Patient seen and examined, agree with above note with above modifications. 62 year old BM with 2 previous episodes of AKI in the last 2 mos.  He now presents with a creatinine elevation from 1.4 to 5 in 5 weeks in the setting of hypotension/UTI/recent course of ACV.  I agree with action to date, agree with hydration, rocephin for UTI and stopping all meds causing hypotension and hyperkalemia.  No indications for HD and he is making urine so hopefully he will recover renal function as he did the last 2 times he had AKI  Annie Sable, MD 11/24/2012

## 2012-11-25 ENCOUNTER — Inpatient Hospital Stay (HOSPITAL_COMMUNITY): Payer: Medicare Other

## 2012-11-25 DIAGNOSIS — J449 Chronic obstructive pulmonary disease, unspecified: Secondary | ICD-10-CM

## 2012-11-25 DIAGNOSIS — N390 Urinary tract infection, site not specified: Secondary | ICD-10-CM | POA: Diagnosis present

## 2012-11-25 DIAGNOSIS — I959 Hypotension, unspecified: Secondary | ICD-10-CM

## 2012-11-25 DIAGNOSIS — I509 Heart failure, unspecified: Secondary | ICD-10-CM

## 2012-11-25 DIAGNOSIS — E860 Dehydration: Secondary | ICD-10-CM | POA: Diagnosis present

## 2012-11-25 DIAGNOSIS — I5022 Chronic systolic (congestive) heart failure: Secondary | ICD-10-CM

## 2012-11-25 DIAGNOSIS — R197 Diarrhea, unspecified: Secondary | ICD-10-CM | POA: Insufficient documentation

## 2012-11-25 DIAGNOSIS — R570 Cardiogenic shock: Secondary | ICD-10-CM | POA: Diagnosis present

## 2012-11-25 LAB — LACTIC ACID, PLASMA: Lactic Acid, Venous: 1.7 mmol/L (ref 0.5–2.2)

## 2012-11-25 LAB — URINE CULTURE

## 2012-11-25 LAB — BASIC METABOLIC PANEL
BUN: 123 mg/dL — ABNORMAL HIGH (ref 6–23)
CO2: 21 mEq/L (ref 19–32)
Chloride: 99 mEq/L (ref 96–112)
GFR calc Af Amer: 15 mL/min — ABNORMAL LOW (ref >90)
Potassium: 3.5 mEq/L (ref 3.5–5.1)
Sodium: 135 mEq/L (ref 135–145)

## 2012-11-25 LAB — CBC
Hemoglobin: 11.8 g/dL — ABNORMAL LOW (ref 13.0–17.0)
MCV: 75.6 fL — ABNORMAL LOW (ref 78.0–100.0)
Platelets: 95 10*3/uL — ABNORMAL LOW (ref 150–400)
RBC: 4.63 MIL/uL (ref 4.22–5.81)
RDW: 17.5 % — ABNORMAL HIGH (ref 11.5–15.5)
WBC: 5.1 10*3/uL (ref 4.0–10.5)

## 2012-11-25 LAB — GLUCOSE, CAPILLARY
Glucose-Capillary: 83 mg/dL (ref 70–99)
Glucose-Capillary: 92 mg/dL (ref 70–99)
Glucose-Capillary: 104 mg/dL — ABNORMAL HIGH (ref 70–99)
Glucose-Capillary: 116 mg/dL — ABNORMAL HIGH (ref 70–99)

## 2012-11-25 LAB — DIGOXIN LEVEL: Digoxin Level: 1.9 ng/mL (ref 0.8–2.0)

## 2012-11-25 MED ORDER — HEPARIN (PORCINE) IN NACL 100-0.45 UNIT/ML-% IJ SOLN
1550.0000 [IU]/h | INTRAMUSCULAR | Status: AC
  Administered 2012-11-25: 1000 [IU]/h via INTRAVENOUS
  Administered 2012-11-26: 1250 [IU]/h via INTRAVENOUS
  Administered 2012-11-27 – 2012-11-28 (×3): 1600 [IU]/h via INTRAVENOUS
  Filled 2012-11-25 (×10): qty 250

## 2012-11-25 MED ORDER — MILRINONE IN DEXTROSE 20 MG/100ML IV SOLN
0.1250 ug/kg/min | INTRAVENOUS | Status: DC
  Administered 2012-11-25 – 2012-11-27 (×2): 0.125 ug/kg/min via INTRAVENOUS
  Filled 2012-11-25 (×3): qty 100

## 2012-11-25 MED ORDER — AMIODARONE HCL IN DEXTROSE 360-4.14 MG/200ML-% IV SOLN
60.0000 mg/h | INTRAVENOUS | Status: AC
  Administered 2012-11-25 (×2): 60 mg/h via INTRAVENOUS
  Filled 2012-11-25 (×2): qty 200

## 2012-11-25 MED ORDER — ADENOSINE 12 MG/4ML IV SOLN
12.0000 mg | Freq: Once | INTRAVENOUS | Status: AC
  Administered 2012-11-25: 12 mg via INTRAVENOUS
  Filled 2012-11-25: qty 4

## 2012-11-25 MED ORDER — LEVOFLOXACIN IN D5W 750 MG/150ML IV SOLN
750.0000 mg | INTRAVENOUS | Status: DC
  Filled 2012-11-25: qty 150

## 2012-11-25 MED ORDER — AMIODARONE LOAD VIA INFUSION
150.0000 mg | Freq: Once | INTRAVENOUS | Status: AC
  Administered 2012-11-25: 150 mg via INTRAVENOUS
  Filled 2012-11-25: qty 83.34

## 2012-11-25 MED ORDER — SODIUM CHLORIDE 0.9 % IV BOLUS (SEPSIS)
500.0000 mL | Freq: Once | INTRAVENOUS | Status: AC
  Administered 2012-11-25: 500 mL via INTRAVENOUS

## 2012-11-25 MED ORDER — MIDODRINE HCL 5 MG PO TABS
5.0000 mg | ORAL_TABLET | Freq: Three times a day (TID) | ORAL | Status: DC
  Administered 2012-11-25 – 2012-11-28 (×7): 5 mg via ORAL
  Filled 2012-11-25 (×12): qty 1

## 2012-11-25 MED ORDER — AMIODARONE HCL IN DEXTROSE 360-4.14 MG/200ML-% IV SOLN
30.0000 mg/h | INTRAVENOUS | Status: DC
  Administered 2012-11-26 – 2012-11-28 (×5): 30 mg/h via INTRAVENOUS
  Filled 2012-11-25 (×12): qty 200

## 2012-11-25 MED ORDER — PIPERACILLIN-TAZOBACTAM IN DEX 2-0.25 GM/50ML IV SOLN
2.2500 g | Freq: Three times a day (TID) | INTRAVENOUS | Status: DC
  Filled 2012-11-25 (×3): qty 50

## 2012-11-25 MED ORDER — PIPERACILLIN-TAZOBACTAM IN DEX 2-0.25 GM/50ML IV SOLN
2.2500 g | Freq: Three times a day (TID) | INTRAVENOUS | Status: DC
  Administered 2012-11-25 – 2012-11-26 (×3): 2.25 g via INTRAVENOUS
  Filled 2012-11-25 (×5): qty 50

## 2012-11-25 MED ORDER — HEPARIN BOLUS VIA INFUSION
4000.0000 [IU] | Freq: Once | INTRAVENOUS | Status: AC
  Administered 2012-11-25: 4000 [IU] via INTRAVENOUS
  Filled 2012-11-25: qty 4000

## 2012-11-25 MED ORDER — SODIUM CHLORIDE 0.9 % IV BOLUS (SEPSIS)
250.0000 mL | Freq: Once | INTRAVENOUS | Status: DC
Start: 2012-11-25 — End: 2012-11-25

## 2012-11-25 NOTE — Progress Notes (Addendum)
ANTIBIOTIC CONSULT NOTE - INITIAL  Pharmacy Consult for Levaquin  Indication: UTI, h/o enterobacter UTI  Allergies  Allergen Reactions  . Other Nausea And Vomiting    Patient stated drug is "kerein"    Patient Measurements: Height: 6\' 2"  (188 cm) Weight: 170 lb 10.2 oz (77.4 kg) IBW/kg (Calculated) : 82.2  Vital Signs: Temp: 97.7 F (36.5 C) (09/25 1059) Temp src: Oral (09/25 1059) BP: 78/55 mmHg (09/25 1200) Pulse Rate: 113 (09/25 0430) Intake/Output from previous day: 09/24 0701 - 09/25 0700 In: 2455 [P.O.:780; I.V.:1625; IV Piggyback:50] Out: 775 [Urine:775] Intake/Output from this shift: Total I/O In: 620 [P.O.:120; I.V.:500] Out: 3 [Stool:3]  Labs:  Recent Labs  11/23/12 2105 11/23/12 2121 11/24/12 0330 11/24/12 0530 11/25/12 0624  WBC 6.5  --   --  5.3 5.1  HGB 14.3 16.3  --  13.9 11.8*  PLT 116*  --   --  123* 95*  LABCREA  --   --  98.12  --   --   CREATININE 5.07* 5.50*  --  5.02* 4.45*   Estimated Creatinine Clearance: 18.8 ml/min (by C-G formula based on Cr of 4.45). No results found for this basename: VANCOTROUGH, Leodis Binet, VANCORANDOM, GENTTROUGH, GENTPEAK, GENTRANDOM, TOBRATROUGH, TOBRAPEAK, TOBRARND, AMIKACINPEAK, AMIKACINTROU, AMIKACIN,  in the last 72 hours   Microbiology: Recent Results (from the past 720 hour(s))  URINE CULTURE     Status: None   Collection Time    11/23/12  9:04 PM      Result Value Range Status   Specimen Description URINE, RANDOM   Final   Special Requests NONE   Final   Culture  Setup Time     Final   Value: 11/24/2012 01:28     Performed at [facility] Count     Final   Value: >=100,000 COLONIES/ML     Performed at [facility]   Culture     Final   Value: ENTEROBACTER AEROGENES     Performed at [facility]   Report Status 11/25/2012 FINAL   Final   Organism ID, Bacteria ENTEROBACTER AEROGENES   Final  CULTURE, BLOOD (ROUTINE X 2)     Status: None   Collection Time   11/23/12 10:10 PM      Result Value Range Status   Specimen Description BLOOD LEFT HAND   Final   Special Requests BOTTLES DRAWN AEROBIC ONLY   Final   Culture  Setup Time     Final   Value: 11/24/2012 01:01     Performed at [facility]   Culture     Final   Value:        BLOOD CULTURE RECEIVED NO GROWTH TO DATE CULTURE WILL BE HELD FOR 5 DAYS BEFORE ISSUING A FINAL NEGATIVE REPORT     Performed at [facility]   Report Status PENDING   Incomplete  CULTURE, BLOOD (ROUTINE X 2)     Status: None   Collection Time    11/23/12 10:15 PM      Result Value Range Status   Specimen Description BLOOD RIGHT HAND   Final   Special Requests BOTTLES DRAWN AEROBIC ONLY   Final   Culture  Setup Time     Final   Value: 11/24/2012 01:01     Performed at [facility]   Culture     Final   Value:        BLOOD CULTURE RECEIVED NO GROWTH  TO DATE CULTURE WILL BE HELD FOR 5 DAYS BEFORE ISSUING A FINAL NEGATIVE REPORT     Performed at [facility]   Report Status PENDING   Incomplete  CLOSTRIDIUM DIFFICILE BY PCR     Status: None   Collection Time    11/24/12  3:31 AM      Result Value Range Status   C difficile by pcr NEGATIVE  NEGATIVE Final    Medical History: Past Medical History  Diagnosis Date  . Diabetes mellitus without complication   . CHF (congestive heart failure)   . Hypertension   . Cardiomyopathy, dilated   . Small bowel obstruction 10/2012  . COPD (chronic obstructive pulmonary disease)    Assessment: 62 y/o M here with A/CRF, digoxin toxicity, possible UTI. WBC 5.1<5.3, afebrile, renal function slightly improved at Scr 4.45<5.02 with CrCl ~20.   Ceftriaxone 9/23>>9/24 Levaquin 9/25>>  9/25 Urine>>Enterobacter aerogenes (R-CTX, S-Levaquin) 9/25 Blood x 2>>pend  Goal of Therapy:  Eradication of infection  Plan:  -Levaquin 750 mg IV q48h -Trend WBC, temp -Trend renal function for any needed dosing changes -F/U LOT  Thank you for  allowing me to take part in this patient's care,  Abran Duke, PharmD Clinical Pharmacist Phone: [phone] Pager: [phone] 11/25/2012 2:27 PM  Addendum:   -Will change to Zosyn 2.25g IV q8h -F/U renal function, may be able to switch to EI dosing if renal function continues to improve  [facility], PharmD Pager [phone]

## 2012-11-25 NOTE — Progress Notes (Signed)
ANTICOAGULATION CONSULT NOTE - Initial Consult  Pharmacy Consult for Heparin  Indication: atrial fibrillation  Allergies  Allergen Reactions  . Other Nausea And Vomiting    Patient stated drug is "kerein"    Patient Measurements: Height: 6\' 2"  (188 cm) Weight: 170 lb 10.2 oz (77.4 kg) IBW/kg (Calculated) : 82.2 Heparin Dosing Weight: 77kg  Vital Signs: Temp: 97.7 F (36.5 C) (09/25 1059) Temp src: Oral (09/25 1059) BP: 79/53 mmHg (09/25 1955) Pulse Rate: 57 (09/25 1955)  Labs:  Recent Labs  11/23/12 2105 11/23/12 2121 11/24/12 0530 11/25/12 0624  HGB 14.3 16.3 13.9 11.8*  HCT 43.0 48.0 41.1 35.0*  PLT 116*  --  123* 95*  CREATININE 5.07* 5.50* 5.02* 4.45*    Estimated Creatinine Clearance: 18.8 ml/min (by C-G formula based on Cr of 4.45).   Medical History: Past Medical History  Diagnosis Date  . Diabetes mellitus without complication   . CHF (congestive heart failure)   . Hypertension   . Cardiomyopathy, dilated   . Small bowel obstruction 10/2012  . COPD (chronic obstructive pulmonary disease)      Assessment: 62yom with Hx HF EF 20% admitted with acute renal failure and digoxin toxicity.  Digoxin level improved after digabind 5.1>1.9, K improved 5.8>3.5, renal function improving 5.5>4.5 ( baseline 1.4 8/14).  He has new onset A-Fib RVr HR 100s this admit.  Plan to begin heparin for anticoagulation and amiodarone for rate control.   Goal of Therapy:  Heparin level 0.3-0.7 units/ml Monitor platelets by anticoagulation protocol: Yes   Plan:   Heparin bolus 4000 uts IV x1 Heparin drip 1000 uts/hr  Daily heparin level, CBC   Leota Sauers Pharm.D. CPP, BCPS Clinical Pharmacist [phone] 11/25/2012 8:15 PM

## 2012-11-25 NOTE — Progress Notes (Signed)
Pharmacy Consult for Milrinone (Primacor) Initiation  Indication:   Acute Decompensated Heart Failure with volume overload and low cardiac output  Allergies  Allergen Reactions  . Other Nausea And Vomiting    Patient stated drug is "kerein"    Temp:  [97.7 F (36.5 C)-97.9 F (36.6 C)] 97.7 F (36.5 C) (09/25 1059) Pulse Rate:  [54-114] 57 (09/25 1955) Cardiac Rhythm:  [-] Heart block (09/25 0700) Resp:  [14-26] 17 (09/25 1955) BP: (64-88)/(49-65) 79/53 mmHg (09/25 1955) SpO2:  [96 %-100 %] 97 % (09/25 1955) Weight:  [170 lb 10.2 oz (77.4 kg)] 170 lb 10.2 oz (77.4 kg) (09/25 0400)  LABS    Component Value Date/Time   NA 135 11/25/2012 0624   K 3.5 11/25/2012 0624   CL 99 11/25/2012 0624   CO2 21 11/25/2012 0624   GLUCOSE 97 11/25/2012 0624   BUN 123* 11/25/2012 0624   CREATININE 4.45* 11/25/2012 0624   CALCIUM 8.0* 11/25/2012 0624   GFRNONAA 13* 11/25/2012 0624   GFRAA 15* 11/25/2012 0624   Last magnesium:  Lab Results  Component Value Date   MG 2.0 10/07/2012   Estimated Creatinine Clearance: 18.8 ml/min (by C-G formula based on Cr of 4.45). CREATININE: 4.45 mg/dL ABNORMAL (16/10/96 0454) Estimated creatinine clearance - Cockcroft-Gault CrCl: 18.8 mL/min estimated creatinine clearance is 18.8 ml/min (by C-G formula based on Cr of 4.45).   Intake/Output Summary (Last 24 hours) at 11/25/12 2027 Last data filed at 11/25/12 1845  Gross per 24 hour  Intake 3853.75 ml  Output    380 ml  Net 3473.75 ml    Filed Weights   11/24/12 0330 11/25/12 0400  Weight: 162 lb 0.6 oz (73.5 kg) 170 lb 10.2 oz (77.4 kg)    Assessment:  Mr Viney is a 62 y.o. male admitted 11/23/2012 with acute decompensated congestive heart failure to be initiated on milrinone.  Patient with EF 20% and admitted with digoxin toxicity improved 5.1>1.9 and acute renal failure improving Cr 5.5>4.5, K improved 5.8>3.5, Mag stable 2.  Patient has had + I/O with IVF running 172ml/hr for dehydration at admit.  BP  is soft 80/50, HR 90-100.  I have spoke to MD and will proceed with low dose milrinone as increased inotrpoic support amy outweigh vasodilation and support BP.  Milrinone can cause arrhythmias.  Monitor patient for ECG changes.  Plan is to initiate milrinone for inotropic support.  Plan:  1. Initiate milrinone based on renal function: (Consider starting dose of 0.125 - 0.25 for patients with SBP <170mmHg) [facility] Calculated CrCl Dose  []  > 50 ml/min 0.375 mcg/kg/min  []  20-49 ml/min 0.250 mcg/kg/min  [x]  < 20 ml/min 0.125 mcg/kg/min   2. Nursing to monitor vital signs per milrinone protocol and physician parameters. 3. Pharmacy to follow peripherally, please reconsult if needed or there is further questions. 4.  Please contact MD for further dosing instructions.  Thank you for allowing [place] to be a part of this patient's care.  Leota Sauers Pharm.D. CPP, BCPS Clinical Pharmacist [phone] 11/25/2012 8:32 PM

## 2012-11-25 NOTE — Consult Note (Addendum)
PULMONARY  / CRITICAL CARE MEDICINE  Name: Caleb Bauer MRN: [id number] DOB: 23-May-1950    ADMISSION DATE:  11/23/2012 CONSULTATION DATE:  11/25/2012  REFERRING MD :  [facility] PRIMARY SERVICE: [facility]  CHIEF COMPLAINT:  Hypotension  BRIEF PATIENT DESCRIPTION: 62 year old male with PMH of CRI stage 3 and marginal BP at baseline presenting to the hospital with dig toxicity.  Was admitted to SDU, renal function continued to deteriorate, on 9/25 patient was noted to be hypotensive and PCCM was called on consultation.  Of note, the patient is being treated for UTI.  He also has diarrhea with high doses of kayexalate and C. Diff is negative.  SIGNIFICANT EVENTS / STUDIES:  9/25 digoxin toxicity.  LINES / TUBES: PIV  CULTURES: Blood 9/23>>>NTD Urine 9/23>>>Enterobacter aerogenes sensitive only to bactrim, zosyn and levaquin.  ANTIBIOTICS: Zosyn 9/24>>>  PAST MEDICAL HISTORY :  Past Medical History  Diagnosis Date  . Diabetes mellitus without complication   . CHF (congestive heart failure)   . Hypertension   . Cardiomyopathy, dilated   . Small bowel obstruction 10/2012  . COPD (chronic obstructive pulmonary disease)    Past Surgical History  Procedure Laterality Date  . Laparoscopic incisional / umbilical / ventral hernia repair     Prior to Admission medications   Medication Sig Start Date End Date Taking? Authorizing Provider  allopurinol (ZYLOPRIM) 100 MG tablet Take 1 tablet (100 mg total) by mouth daily. 10/20/12  Yes Tiffany L Reed, DO  amiodarone (PACERONE) 200 MG tablet Take 1 tablet (200 mg total) by mouth daily. 09/24/12  Yes Richarda Overlie, MD  aspirin EC 81 MG tablet Take 81 mg by mouth daily.   Yes Historical Provider, MD  cholecalciferol (VITAMIN D) 1000 UNITS tablet Take 1,000 Units by mouth daily.   Yes Historical Provider, MD  colchicine 0.6 MG tablet Take 1 tablet (0.6 mg total) by mouth daily. 10/20/12  Yes Tiffany L Reed, DO  digoxin (LANOXIN) 0.125 MG tablet Take 1  tablet (0.125 mg total) by mouth daily. 09/24/12  Yes Richarda Overlie, MD  feeding supplement (PRO-STAT SUGAR FREE 64) LIQD Take 30 mLs by mouth 3 (three) times daily with meals.   Yes Historical Provider, MD  HYDROcodone-acetaminophen (NORCO/VICODIN) 5-325 MG per tablet Take 1 tablet by mouth every 6 (six) hours as needed for pain.   Yes Historical Provider, MD  ipratropium-albuterol (DUONEB) 0.5-2.5 (3) MG/3ML SOLN Take 3 mLs by nebulization 4 (four) times daily.   Yes Historical Provider, MD  ipratropium-albuterol (DUONEB) 0.5-2.5 (3) MG/3ML SOLN Take 3 mLs by nebulization every 3 (three) hours as needed (for wheezing /shortness of breath).   Yes Historical Provider, MD  isosorbide-hydrALAZINE (BIDIL) 20-37.5 MG per tablet Take 1 tablet by mouth 3 (three) times daily. 09/24/12  Yes Richarda Overlie, MD  metoprolol tartrate (LOPRESSOR) 25 MG tablet Take 6.25 mg by mouth 2 (two) times daily. Hold if pulse < 50; SBP < 100   Yes Historical Provider, MD  mirtazapine (REMERON) 7.5 MG tablet Take 7.5 mg by mouth at bedtime.   Yes Historical Provider, MD  Multiple Vitamin (MULTIVITAMIN WITH MINERALS) TABS tablet Take 2 tablets by mouth daily.    Yes Historical Provider, MD  omeprazole (PRILOSEC) 40 MG capsule Take 40 mg by mouth daily.   Yes Historical Provider, MD  potassium chloride SA (K-DUR,KLOR-CON) 20 MEQ tablet Take 40 mEq by mouth daily.   Yes Historical Provider, MD  torsemide (DEMADEX) 20 MG tablet Take 2 tablets (40 mg  total) by mouth 2 (two) times daily. 09/27/12  Yes Richarda Overlie, MD   Allergies  Allergen Reactions  . Other Nausea And Vomiting    Patient stated drug is "kerein"    FAMILY HISTORY:  Family History  Problem Relation Age of Onset  . Cancer Neg Hx    SOCIAL HISTORY:  reports that he quit smoking about 8 weeks ago. His smoking use included Cigarettes. He has a .125 pack-year smoking history. He does not have any smokeless tobacco history on file. He reports that he does not drink  alcohol or use illicit drugs.  REVIEW OF SYSTEMS:  12 point ROS is negative other than above.  SUBJECTIVE:   VITAL SIGNS: Temp:  [97.5 F (36.4 C)-97.9 F (36.6 C)] 97.7 F (36.5 C) (09/25 1059) Pulse Rate:  [58-114] 58 (09/25 1403) Resp:  [14-26] 26 (09/25 1403) BP: (70-96)/(49-80) 77/55 mmHg (09/25 1500) SpO2:  [96 %-100 %] 100 % (09/25 1403) Weight:  [77.4 kg (170 lb 10.2 oz)] 77.4 kg (170 lb 10.2 oz) (09/25 0400) HEMODYNAMICS:   VENTILATOR SETTINGS:   INTAKE / OUTPUT: Intake/Output     09/24 0701 - 09/25 0700 09/25 0701 - 09/26 0700   P.O. 780 600   I.V. (mL/kg) 1625 (21) 500 (6.5)   IV Piggyback 50    Total Intake(mL/kg) 2455 (31.7) 1100 (14.2)   Urine (mL/kg/hr) 775 (0.4)    Stool  4 (0)   Total Output 775 4   Net +1680 +1096        Urine Occurrence 2 x    Stool Occurrence 1 x      PHYSICAL EXAMINATION: General:  Chronically ill appearing male, NAD. Neuro:  Alert, oriented and interactive, moving all ext to command. HEENT:  [place]/AT, PERRL, EOM-I and MMM. Cardiovascular:  RRR, Nl S1/S2, -M/R/G. Lungs:  CTA bilaterally. Abdomen:  Soft, NT, ND and +BS. Musculoskeletal:  -edema and -tenderness. Skin:  Intact.  LABS:  CBC Recent Labs     11/23/12  2105  11/23/12  2121  11/24/12  0530  11/25/12  0624  WBC  6.5   --   5.3  5.1  HGB  14.3  16.3  13.9  11.8*  HCT  43.0  48.0  41.1  35.0*  PLT  116*   --   123*  95*   Coag's No results found for this basename: APTT, INR,  in the last 72 hours  BMET Recent Labs     11/23/12  2105  11/23/12  2121  11/24/12  0530  11/25/12  0624  NA  132*  137  142  135  K  5.8*  5.8*  5.0  3.5  CL  93*  105  101  99  CO2  21   --   21  21  BUN  134*  129*  133*  123*  CREATININE  5.07*  5.50*  5.02*  4.45*  GLUCOSE  86  83  77  97   Electrolytes Recent Labs     11/23/12  2105  11/24/12  0530  11/25/12  0624  CALCIUM  8.9  8.6  8.0*  PHOS   --   4.4   --    Sepsis Markers No results found for this  basename: LACTICACIDVEN, PROCALCITON, O2SATVEN,  in the last 72 hours  ABG Recent Labs     11/23/12  2303  PHART  7.383  PCO2ART  39.0  PO2ART  92.3   Liver Enzymes Recent  Labs     11/23/12  2105  11/24/12  0530  AST  39*  46*  ALT  26  28  ALKPHOS  101  95  BILITOT  0.6  0.6  ALBUMIN  2.6*  2.6*   Cardiac Enzymes No results found for this basename: TROPONINI, PROBNP,  in the last 72 hours  Glucose Recent Labs     11/24/12  1118  11/24/12  1618  11/24/12  2134  11/25/12  0553  11/25/12  0727  11/25/12  1056  GLUCAP  78  78  83  92  104*  116*    Imaging US Renal  11/23/2012   CLINICAL DATA:  Renal failure.  EXAM: RENAL/URINARY TRACT ULTRASOUND COMPLETE  COMPARISON:  Renal ultrasound 10/07/2012 and CT abdomen and pelvis 10/08/2012.  FINDINGS: Right Kidney  Length: Measures 10.9 cm. There is some increase in cortical echogenicity. No stone, mass or hydronephrosis.  Left Kidney  Length: Measures 11.1 cm There is some increase in cortical echogenicity. No stone, mass or hydronephrosis.  Bladder: Bilateral ureteral jets are identified. No abnormality is seen.  IMPRESSION: Negative for hydronephrosis or acute abnormality.  Mildly increased cortical echogenicity compatible with medical renal disease.   Electronically Signed   By: Drusilla Kanner M.D.   On: 11/23/2012 23:14     CXR: None ordered for this admission.  ASSESSMENT / PLAN:  PULMONARY A: No active issues, however, watch with low EF, renal failure and the amount of fluid being given.  COPD history. P:   - Titrate O2 for sats. - IS per RT protocol. - CXR ordered. - Bronchodilators as needed.  CARDIOVASCULAR A: Systolic CHF with low EF.  "hypotensive" patient has baseline low BP, 80-90s but more importantly he is mentating very well.  I performed orthostatics bedside and the patient did well. P:  - Gentle hydration only. - Would not give more IVF than what is being given, would even recommend decreasing to  50 ml/hr while not eating then [facility] once able to eat. - Midodrine low dose as ordered. - Check lactic acid level.  RENAL A:  Acute on chronic renal failure. P:   - Renal following, will defer to the renal service.  GASTROINTESTINAL A:  Diarrhea.  Likely related to high dose kayexalate. P:   - No more kayexalate given. - C. Diff negative. - Would monitor and not treat for now.  HEMATOLOGIC A:  No active issues. P:  - CBC as needed.  INFECTIOUS A:  Enterobacter UTI. P:   - Continue zosyn. - Check lactic acid level.  ENDOCRINE A:  DM.   P:   - Per primary.  NEUROLOGIC A:  No active issues. P:   - Monitor.  TODAY'S SUMMARY: 62 year old male with PMH of CRI, presenting to PCCM for hypotension.  Mental status is fine and patient reports that he usually runs low BP.  Will check a lactic acid level, if high will reconsider but for now, keep in SDU, monitor BP, decrease IVF rate and start midodrine.  I have personally obtained a history, examined the patient, evaluated laboratory and imaging results, formulated the assessment and plan and placed orders.  Alyson Reedy, M.D. Pulmonary and Critical Care Medicine [facility] Pager: [phone]  11/25/2012, 4:23 PM

## 2012-11-25 NOTE — Progress Notes (Deleted)
[facility] Progress Note [place] TEAM 1 - Stepdown ICU Team   Caleb Bauer ZOX:[id number] DOB: 1950/09/20 DOA: 11/23/2012 PCP: Bufford Spikes, DO  Brief narrative:  Assessment/Plan: Active Problems:   SIRS/Sepsis -suspect etiology UTI -supportive care: BP soft noting baseline systolic is ~115-109 -cautious hydration given low EF-may need closer monitoring in ICU or at minimum needs CL for CVP measurements -Alternating between tachycardia and now bradycardia and his development tachypnea      UTI (lower urinary tract infection) ->100,000 GNR's- history recent cephalosporin resistent enterobacter-considered Levaquin but has slightly prolonged Qtc and h/o VT so d/w phramacist and will use renally adjusted Zosyn    Digoxin toxicity -received digibind with decrease from 5.1 to 1.9 today -given pts underlying CKD 3 and low EF would NOT use digoxin in future    Acute renal failure on CKD (chronic kidney disease) stage 3, GFR 30-59 ml/min -baseline renal function is 37/1.38 -BUN remains markedly elevated with Cr 4.45 -UOP is ? Marginal although I/O not accurate due to incontinence so will see if can apply condom cath -appreciate renal assistance    Hypotension -combo DH and sepsis- follow -hold offending meds  UPDATE: 350pm- despite nearly 3 liters of fluids since admit and recent 500 cc bolus SBP still <80- pt/family as well as attending updated - PCCM consulted in event needs to tx to ICU for invasive monitoring and pressors    Dehydration -cont IVF at 125/hr -current SBP 70's so give 500 cc NS now    Relative hypokalemia -nephro has resumed low dose kcl esp in light of VT history    Diabetes mellitus without complication -cont SSI    Thrombocytopenia, unspecified (chronic)  -chronic but lower than baseline - follow    Diarrhea -combo from colchicine pre admit, ARF and UTI -C Diff negaitve    Chronic systolic congestive heart failure, NYHA class  4 -compensated- notify primary cards Jacinto Halim) of admit and current poor hemodynamic status    Hypertension -now hypotensive so home meds on hold    History of ventricular tachycardia -previously deemed not eligible for ICD per cards (see August 2014 notes)    COPD (chronic obstructive pulmonary disease) -compensated   DVT prophylaxis: SCDs Code Status: Full Family Communication: Patient and niece is at bedside Disposition Plan/Expected LOS: Stepdown although it does not improve from a hemodynamic standpoint will likely need to be transferred to the ICU Isolation: None Nutritional Status: Acute protein calorie malnutrition related to medical illness  Consultants: Nephrology Cardiology  Procedures: None  Antibiotics: Rocephin 9/23 >>> Zosyn 9/25 >>>   HPI/Subjective: Patient awake but seems somewhat lethargic and complaining of generalized malaise and overall feeling poorly. Does not endorse shortness of breath or chest pain. Family at bedside later in the afternoon and said they were going to get patient something to eat at [facility] but did not realize patient was on a restricted diet based on renal function and cardiac function-this was explained thoroughly to them and they verbalized understanding.  Objective: Blood pressure 64/49, pulse 54, temperature 97.7 F (36.5 C), temperature source Oral, resp. rate 18, height 6\' 2"  (1.88 m), weight 77.4 kg (170 lb 10.2 oz), SpO2 99.00%.  Intake/Output Summary (Last 24 hours) at 11/25/12 1759 Last data filed at 11/25/12 1600  Gross per 24 hour  Intake   3880 ml  Output    779 ml  Net   3101 ml     Exam: General: No acute respiratory distress Lungs: Clear to auscultation bilaterally  without wheezes or crackles, RA Cardiovascular: Regular rate and rhythm without murmur gallop or rub normal S1 and S2, no peripheral edema or JVD-systolic blood pressure down in the 70s and 80s earlier noting baseline systolic blood pressure between  109 and 1:15 per last discharge summary Abdomen: Nontender, nondistended, soft, bowel sounds positive, no rebound, no ascites, no appreciable mass Musculoskeletal: No significant cyanosis, clubbing of bilateral lower extremities Neurological: Awake and oriented x 3, moves all extremities x 4 without focal neurological deficits, CN 2-12 intact  Scheduled Meds:  Scheduled Meds: . insulin aspart  0-9 Units Subcutaneous TID WC  . midodrine  5 mg Oral TID WC  . piperacillin-tazobactam (ZOSYN)  IV  2.25 g Intravenous Q8H  . sodium chloride  3 mL Intravenous Q12H  . sodium chloride  3 mL Intravenous Q12H   Continuous Infusions: . sodium chloride 125 mL/hr (11/25/12 1143)    **Reviewed in detail by the Attending Physicia  Data Reviewed: Basic Metabolic Panel:  Recent Labs Lab 11/23/12 2105 11/23/12 2121 11/24/12 0530 11/25/12 0624  NA 132* 137 142 135  K 5.8* 5.8* 5.0 3.5  CL 93* 105 101 99  CO2 21  --  21 21  GLUCOSE 86 83 77 97  BUN 134* 129* 133* 123*  CREATININE 5.07* 5.50* 5.02* 4.45*  CALCIUM 8.9  --  8.6 8.0*  PHOS  --   --  4.4  --    Liver Function Tests:  Recent Labs Lab 11/23/12 2105 11/24/12 0530  AST 39* 46*  ALT 26 28  ALKPHOS 101 95  BILITOT 0.6 0.6  PROT 8.5* 7.9  ALBUMIN 2.6* 2.6*   No results found for this basename: LIPASE, AMYLASE,  in the last 168 hours No results found for this basename: AMMONIA,  in the last 168 hours CBC:  Recent Labs Lab 11/23/12 2105 11/23/12 2121 11/24/12 0530 11/25/12 0624  WBC 6.5  --  5.3 5.1  NEUTROABS 2.5  --  1.9  --   HGB 14.3 16.3 13.9 11.8*  HCT 43.0 48.0 41.1 35.0*  MCV 76.5*  --  76.5* 75.6*  PLT 116*  --  123* 95*   Cardiac Enzymes: No results found for this basename: CKTOTAL, CKMB, CKMBINDEX, TROPONINI,  in the last 168 hours BNP (last 3 results)  Recent Labs  09/25/12 0450 09/26/12 0530 10/06/12 2305  PROBNP 16109.6* 34516.0* 33980.0*   CBG:  Recent Labs Lab 11/24/12 2134  11/25/12 0553 11/25/12 0727 11/25/12 1056 11/25/12 1738  GLUCAP 83 92 104* 116* 120*    Recent Results (from the past 240 hour(s))  URINE CULTURE     Status: None   Collection Time    11/23/12  9:04 PM      Result Value Range Status   Specimen Description URINE, RANDOM   Final   Special Requests NONE   Final   Culture  Setup Time     Final   Value: 11/24/2012 01:28     Performed at [facility] Count     Final   Value: >=100,000 COLONIES/ML     Performed at [facility]   Culture     Final   Value: ENTEROBACTER AEROGENES     Performed at [facility]   Report Status 11/25/2012 FINAL   Final   Organism ID, Bacteria ENTEROBACTER AEROGENES   Final  CULTURE, BLOOD (ROUTINE X 2)     Status: None   Collection Time    11/23/12 10:10 PM  Result Value Range Status   Specimen Description BLOOD LEFT HAND   Final   Special Requests BOTTLES DRAWN AEROBIC ONLY   Final   Culture  Setup Time     Final   Value: 11/24/2012 01:01     Performed at [facility]   Culture     Final   Value:        BLOOD CULTURE RECEIVED NO GROWTH TO DATE CULTURE WILL BE HELD FOR 5 DAYS BEFORE ISSUING A FINAL NEGATIVE REPORT     Performed at [facility]   Report Status PENDING   Incomplete  CULTURE, BLOOD (ROUTINE X 2)     Status: None   Collection Time    11/23/12 10:15 PM      Result Value Range Status   Specimen Description BLOOD RIGHT HAND   Final   Special Requests BOTTLES DRAWN AEROBIC ONLY   Final   Culture  Setup Time     Final   Value: 11/24/2012 01:01     Performed at [facility]   Culture     Final   Value:        BLOOD CULTURE RECEIVED NO GROWTH TO DATE CULTURE WILL BE HELD FOR 5 DAYS BEFORE ISSUING A FINAL NEGATIVE REPORT     Performed at [facility]   Report Status PENDING   Incomplete  CLOSTRIDIUM DIFFICILE BY PCR     Status: None   Collection Time    11/24/12  3:31 AM      Result Value Range Status   C  difficile by pcr NEGATIVE  NEGATIVE Final     Studies:  Recent x-ray studies have been reviewed in detail by the Attending Physician    Junious Silk, ANP [facility] Office  [phone] Pager [phone]  **If unable to reach the above provider after paging please contact the Flow Manager @ [phone]  On-Call/Text Page:      [email or web address]      password [email or web address]  If 7PM-7AM, please contact night-coverage [email or web address] Password [facility] 11/25/2012, 5:59 PM   LOS: 2 days   I have examined the patient, reviewed the chart and modified the above note which I agree with.   Caleb Luna,MD [phone] 11/25/2012, 6:02 PM

## 2012-11-25 NOTE — Progress Notes (Addendum)
[facility] Progress Note [place] TEAM 1 - Stepdown ICU Team   BURK HOCTOR ZOX:[id number] DOB: 11-24-1950 DOA: 11/23/2012 PCP: Bufford Spikes, DO  Brief narrative:  Assessment/Plan: Active Problems:   SIRS/Sepsis -suspect etiology UTI -supportive care: BP soft noting baseline systolic is ~115-109 -cautious hydration given low EF-may need closer monitoring in ICU or at minimum needs CL for CVP measurements -Alternating between tachycardia and now bradycardia and his development tachypnea   ?? Atrial Flutter -Tele suspicious for AFlutter- 12 lead confirms not in sinus -Cardiology consulted      UTI (lower urinary tract infection) ->100,000 GNR's- history recent cephalosporin resistent enterobacter-considered Levaquin but has slightly prolonged Qtc and h/o VT so d/w phramacist and will use renally adjusted Zosyn    Digoxin toxicity -received digibind with decrease from 5.1 to 1.9 today -given pts underlying CKD 3 and low EF would NOT use digoxin in future    Acute renal failure on CKD (chronic kidney disease) stage 3, GFR 30-59 ml/min -baseline renal function is 37/1.38 -BUN remains markedly elevated with Cr 4.45 -UOP is ? Marginal although I/O not accurate due to incontinence so will see if can apply condom cath -appreciate renal assistance    Hypotension -combo DH and sepsis- follow -hold offending meds  UPDATE: 350pm- despite nearly 3 liters of fluids since admit and recent 500 cc bolus SBP still <80- pt/family as well as attending updated - PCCM consulted in event needs to tx to ICU for invasive monitoring and pressors    Dehydration -cont IVF at 125/hr -current SBP 70's so give 500 cc NS now    Relative hypokalemia -nephro has resumed low dose kcl esp in light of VT history    Diabetes mellitus without complication -cont SSI    Thrombocytopenia, unspecified (chronic)  -chronic but lower than baseline - follow    Diarrhea -combo from colchicine pre  admit, ARF and UTI -C Diff negaitve    Chronic systolic congestive heart failure, NYHA class 4 -compensated- notify primary cards Jacinto Halim) of admit and current poor hemodynamic status    Hypertension -now hypotensive so home meds on hold    History of ventricular tachycardia -previously deemed not eligible for ICD per cards (see August 2014 notes)    COPD (chronic obstructive pulmonary disease) -compensated   DVT prophylaxis: SCDs Code Status: Full Family Communication: Patient and niece is at bedside Disposition Plan/Expected LOS: Stepdown although it does not improve from a hemodynamic standpoint will likely need to be transferred to the ICU Isolation: None Nutritional Status: Acute protein calorie malnutrition related to medical illness  Consultants: Nephrology Cardiology  Procedures: None  Antibiotics: Rocephin 9/23 >>> Zosyn 9/25 >>>   HPI/Subjective: Patient awake but seems somewhat lethargic and complaining of generalized malaise and overall feeling poorly. Does not endorse shortness of breath or chest pain. Family at bedside later in the afternoon and said they were going to get patient something to eat at [facility] but did not realize patient was on a restricted diet based on renal function and cardiac function-this was explained thoroughly to them and they verbalized understanding.  Objective: Blood pressure 78/55, pulse 113, temperature 97.7 F (36.5 C), temperature source Oral, resp. rate 17, height 6\' 2"  (1.88 m), weight 77.4 kg (170 lb 10.2 oz), SpO2 97.00%.  Intake/Output Summary (Last 24 hours) at 11/25/12 1408 Last data filed at 11/25/12 1100  Gross per 24 hour  Intake   2895 ml  Output    778 ml  Net  2117 ml     Exam: General: No acute respiratory distress Lungs: Clear to auscultation bilaterally without wheezes or crackles, RA Cardiovascular: Regular rate and rhythm without murmur gallop or rub normal S1 and S2, no peripheral edema or  JVD-systolic blood pressure down in the 70s and 80s earlier noting baseline systolic blood pressure between 109 and 1:15 per last discharge summary Abdomen: Nontender, nondistended, soft, bowel sounds positive, no rebound, no ascites, no appreciable mass Musculoskeletal: No significant cyanosis, clubbing of bilateral lower extremities Neurological: Awake and oriented x 3, moves all extremities x 4 without focal neurological deficits, CN 2-12 intact  Scheduled Meds:  Scheduled Meds: . insulin aspart  0-9 Units Subcutaneous TID WC  . piperacillin-tazobactam (ZOSYN)  IV  2.25 g Intravenous Q8H  . sodium chloride  500 mL Intravenous Once  . sodium chloride  3 mL Intravenous Q12H  . sodium chloride  3 mL Intravenous Q12H   Continuous Infusions: . sodium chloride 125 mL/hr (11/25/12 1143)    **Reviewed in detail by the Attending Physicia  Data Reviewed: Basic Metabolic Panel:  Recent Labs Lab 11/23/12 2105 11/23/12 2121 11/24/12 0530 11/25/12 0624  NA 132* 137 142 135  K 5.8* 5.8* 5.0 3.5  CL 93* 105 101 99  CO2 21  --  21 21  GLUCOSE 86 83 77 97  BUN 134* 129* 133* 123*  CREATININE 5.07* 5.50* 5.02* 4.45*  CALCIUM 8.9  --  8.6 8.0*  PHOS  --   --  4.4  --    Liver Function Tests:  Recent Labs Lab 11/23/12 2105 11/24/12 0530  AST 39* 46*  ALT 26 28  ALKPHOS 101 95  BILITOT 0.6 0.6  PROT 8.5* 7.9  ALBUMIN 2.6* 2.6*   No results found for this basename: LIPASE, AMYLASE,  in the last 168 hours No results found for this basename: AMMONIA,  in the last 168 hours CBC:  Recent Labs Lab 11/23/12 2105 11/23/12 2121 11/24/12 0530 11/25/12 0624  WBC 6.5  --  5.3 5.1  NEUTROABS 2.5  --  1.9  --   HGB 14.3 16.3 13.9 11.8*  HCT 43.0 48.0 41.1 35.0*  MCV 76.5*  --  76.5* 75.6*  PLT 116*  --  123* 95*   Cardiac Enzymes: No results found for this basename: CKTOTAL, CKMB, CKMBINDEX, TROPONINI,  in the last 168 hours BNP (last 3 results)  Recent Labs  09/25/12 0450  09/26/12 0530 10/06/12 2305  PROBNP 16109.6* 34516.0* 33980.0*   CBG:  Recent Labs Lab 11/24/12 1618 11/24/12 2134 11/25/12 0553 11/25/12 0727 11/25/12 1056  GLUCAP 78 83 92 104* 116*    Recent Results (from the past 240 hour(s))  URINE CULTURE     Status: None   Collection Time    11/23/12  9:04 PM      Result Value Range Status   Specimen Description URINE, RANDOM   Final   Special Requests NONE   Final   Culture  Setup Time     Final   Value: 11/24/2012 01:28     Performed at [facility] Count     Final   Value: >=100,000 COLONIES/ML     Performed at [facility]   Culture     Final   Value: GRAM NEGATIVE RODS     Performed at [facility]   Report Status PENDING   Incomplete  CULTURE, BLOOD (ROUTINE X 2)     Status: None   Collection  Time    11/23/12 10:10 PM      Result Value Range Status   Specimen Description BLOOD LEFT HAND   Final   Special Requests BOTTLES DRAWN AEROBIC ONLY   Final   Culture  Setup Time     Final   Value: 11/24/2012 01:01     Performed at [facility]   Culture     Final   Value:        BLOOD CULTURE RECEIVED NO GROWTH TO DATE CULTURE WILL BE HELD FOR 5 DAYS BEFORE ISSUING A FINAL NEGATIVE REPORT     Performed at [facility]   Report Status PENDING   Incomplete  CULTURE, BLOOD (ROUTINE X 2)     Status: None   Collection Time    11/23/12 10:15 PM      Result Value Range Status   Specimen Description BLOOD RIGHT HAND   Final   Special Requests BOTTLES DRAWN AEROBIC ONLY   Final   Culture  Setup Time     Final   Value: 11/24/2012 01:01     Performed at [facility]   Culture     Final   Value:        BLOOD CULTURE RECEIVED NO GROWTH TO DATE CULTURE WILL BE HELD FOR 5 DAYS BEFORE ISSUING A FINAL NEGATIVE REPORT     Performed at [facility]   Report Status PENDING   Incomplete  CLOSTRIDIUM DIFFICILE BY PCR     Status: None   Collection Time    11/24/12  3:31  AM      Result Value Range Status   C difficile by pcr NEGATIVE  NEGATIVE Final     Studies:  Recent x-ray studies have been reviewed in detail by the Attending Physician    Junious Silk, ANP [facility] Office  [phone] Pager [phone]  **If unable to reach the above provider after paging please contact the Flow Manager @ [phone]  On-Call/Text Page:      [email or web address]      password [email or web address]  If 7PM-7AM, please contact night-coverage [email or web address] Password [email or web address] 11/25/2012, 2:08 PM   LOS: 2 days   I have examined the patient, reviewed the chart and modified the above note which I agree with.   Anthonette Lesage,MD [phone] 11/25/2012, 6:07 PM  I have examined the patient, reviewed the chart and modified the above note which I agree with.   Marcille Barman,MD [phone] 12/01/2012, 9:04 PM

## 2012-11-25 NOTE — Consult Note (Addendum)
CARDIOLOGY CONSULT NOTE  Patient ID: BRANDEN SHALLENBERGER MRN: [id number] DOB/AGE: Mar 01, 1951 62 y.o.  Admit date: 11/23/2012 Referring Physician  Calvert Cantor, MD Primary Physician:  Bufford Spikes, DO Reason for Consultation  CHF, Dig toxicity  HPI: Patient  is a 62 year old African American male who I first saw him on 09/22/2012 the was admitted with left leg cellulitis after he was trying to get out of the bed. He recently moved to [facility] from [place]. He lives with his niece. He has been told to have cardiomyopathy and congestive heart failure or 3 years ago. He had not followed up with any of the physicians at that time.  I seen him for acute systolic and diastolic heart failure and also for nonsustained mental tachycardia. He was also in acute renal failure, and had anemia and hypoproteinemia. I have not seen him since then and he was admitted to the hospital 2 days ago with acute renal failure and digitalis toxicity and received Digibind.  She was also admitted in August of 2014 with small bowel obstruction. He was treated conservatively and discharged back to the nursing home.  Patients with a laying in bed comfortably. He states that he feels well and denies any chest pain or shortness of breath.  Past Medical History  Diagnosis Date  . Diabetes mellitus without complication   . CHF (congestive heart failure)   . Hypertension   . Cardiomyopathy, dilated   . Small bowel obstruction 10/2012  . COPD (chronic obstructive pulmonary disease)      Past Surgical History  Procedure Laterality Date  . Laparoscopic incisional / umbilical / ventral hernia repair       Family History  Problem Relation Age of Onset  . Cancer Neg Hx      Social History: History   Social History  . Marital Status: Single    Spouse Name: N/A    Number of Children: N/A  . Years of Education: N/A   Occupational History  . Not on file.   Social History Main Topics  . Smoking  status: Former Smoker -- 0.25 packs/day for .5 years    Types: Cigarettes    Quit date: 09/28/2012  . Smokeless tobacco: Not on file  . Alcohol Use: No  . Drug Use: No  . Sexual Activity: Not on file   Other Topics Concern  . Not on file   Social History Narrative  . No narrative on file     Prescriptions prior to admission  Medication Sig Dispense Refill  . allopurinol (ZYLOPRIM) 100 MG tablet Take 1 tablet (100 mg total) by mouth daily.  30 tablet  3  . amiodarone (PACERONE) 200 MG tablet Take 1 tablet (200 mg total) by mouth daily.  30 tablet  0  . aspirin EC 81 MG tablet Take 81 mg by mouth daily.      . cholecalciferol (VITAMIN D) 1000 UNITS tablet Take 1,000 Units by mouth daily.      . colchicine 0.6 MG tablet Take 1 tablet (0.6 mg total) by mouth daily.  30 tablet  3  . digoxin (LANOXIN) 0.125 MG tablet Take 1 tablet (0.125 mg total) by mouth daily.  30 tablet  10  . feeding supplement (PRO-STAT SUGAR FREE 64) LIQD Take 30 mLs by mouth 3 (three) times daily with meals.      Marland Kitchen HYDROcodone-acetaminophen (NORCO/VICODIN) 5-325 MG per tablet Take 1 tablet by mouth every 6 (six) hours as needed for pain.      Marland Kitchen  ipratropium-albuterol (DUONEB) 0.5-2.5 (3) MG/3ML SOLN Take 3 mLs by nebulization 4 (four) times daily.      Marland Kitchen ipratropium-albuterol (DUONEB) 0.5-2.5 (3) MG/3ML SOLN Take 3 mLs by nebulization every 3 (three) hours as needed (for wheezing /shortness of breath).      . isosorbide-hydrALAZINE (BIDIL) 20-37.5 MG per tablet Take 1 tablet by mouth 3 (three) times daily.  120 tablet  10  . metoprolol tartrate (LOPRESSOR) 25 MG tablet Take 6.25 mg by mouth 2 (two) times daily. Hold if pulse < 50; SBP < 100      . mirtazapine (REMERON) 7.5 MG tablet Take 7.5 mg by mouth at bedtime.      . Multiple Vitamin (MULTIVITAMIN WITH MINERALS) TABS tablet Take 2 tablets by mouth daily.       Marland Kitchen omeprazole (PRILOSEC) 40 MG capsule Take 40 mg by mouth daily.      . potassium chloride SA  (K-DUR,KLOR-CON) 20 MEQ tablet Take 40 mEq by mouth daily.      Marland Kitchen torsemide (DEMADEX) 20 MG tablet Take 2 tablets (40 mg total) by mouth 2 (two) times daily.  120 tablet  2    Scheduled Meds: . adenosine (ADENOCARD) IV  12 mg Intravenous Once  . insulin aspart  0-9 Units Subcutaneous TID WC  . midodrine  5 mg Oral TID WC  . piperacillin-tazobactam (ZOSYN)  IV  2.25 g Intravenous Q8H  . sodium chloride  3 mL Intravenous Q12H  . sodium chloride  3 mL Intravenous Q12H   Continuous Infusions: . sodium chloride 125 mL/hr (11/25/12 1143)   PRN Meds:.acetaminophen, acetaminophen, ondansetron (ZOFRAN) IV, ondansetron, pneumococcal 23 valent vaccine  ROS: Patient feels fatigued and tired. No recent bowel or bladder disturbances except for lack of appetite. Leg edema has completely resolved. No PND or orthopnea. Systems.    Physical Exam: Blood pressure 64/49, pulse 54, temperature 97.7 F (36.5 C), temperature source Oral, resp. rate 18, height 6\' 2"  (1.88 m), weight 77.4 kg (170 lb 10.2 oz), SpO2 99.00%.   General appearance: alert, cooperative, appears older than stated age and no distress Lungs: clear to auscultation bilaterally Heart: S1, S2 normal, S3 present and Tachycardia present Abdomen: soft, non-tender; bowel sounds normal; no masses,  no organomegaly Extremities: extremities normal, atraumatic, no cyanosis or edema Neurologic: Grossly normal  Labs:   Lab Results  Component Value Date   WBC 5.1 11/25/2012   HGB 11.8* 11/25/2012   HCT 35.0* 11/25/2012   MCV 75.6* 11/25/2012   PLT 95* 11/25/2012    Recent Labs Lab 11/24/12 0530 11/25/12 0624  NA 142 135  K 5.0 3.5  CL 101 99  CO2 21 21  BUN 133* 123*  CREATININE 5.02* 4.45*  CALCIUM 8.6 8.0*  PROT 7.9  --   BILITOT 0.6  --   ALKPHOS 95  --   ALT 28  --   AST 46*  --   GLUCOSE 77 97   Lab Results  Component Value Date   CKTOTAL 37 09/15/2012   TROPONINI <0.30 09/16/2012     EKG: Atrial flutter with 2-1  conduction, with a ventricular rate of 100-110 beats a minute. LAD, LAFB, Right branch block, LVH with diffuse ST-T wave changes. Atrial flutter is new compared to the prior EKG.  Echocardiogram 09/13/2012 LVEF 20-25%, right ventricle moderately dilated and hypokinetic.  BNP (last 3 results)  Recent Labs  09/25/12 0450 09/26/12 0530 10/06/12 2305  PROBNP 16109.6* 34516.0* 33980.0*     Radiology: US Renal  11/23/2012   CLINICAL DATA:  Renal failure.  EXAM: RENAL/URINARY TRACT ULTRASOUND COMPLETE  COMPARISON:  Renal ultrasound 10/07/2012 and CT abdomen and pelvis 10/08/2012.  FINDINGS: Right Kidney  Length: Measures 10.9 cm. There is some increase in cortical echogenicity. No stone, mass or hydronephrosis.  Left Kidney  Length: Measures 11.1 cm There is some increase in cortical echogenicity. No stone, mass or hydronephrosis.  Bladder: Bilateral ureteral jets are identified. No abnormality is seen.  IMPRESSION: Negative for hydronephrosis or acute abnormality.  Mildly increased cortical echogenicity compatible with medical renal disease.   Electronically Signed   By: Drusilla Kanner M.D.   On: 11/23/2012 23:14   Dg Chest Port 1 View  11/25/2012   CLINICAL DATA:  Diabetes, hypertension, COPD, dilated cardiomyopathy ; digoxin toxicity, CHF, question pneumonia  EXAM: PORTABLE CHEST - 1 VIEW  COMPARISON:  Portable exam 1718 hr compared to 10/06/2012  FINDINGS: Enlargement of cardiac silhouette with pulmonary vascular congestion.  Tortuous aorta.  No acute failure or consolidation.  Significantly improved aeration at left base since previous exam.  No pleural effusion or pneumothorax.  Bones unremarkable.  IMPRESSION: Enlargement of cardiac silhouette with pulmonary vascular congestion.  No acute abnormalities.   Electronically Signed   By: Ulyses Southward M.D.   On: 11/25/2012 17:28    ASSESSMENT AND PLAN:  1. Acute on chronic systolic and diastolic heart failure, involving both the right ventricle and  left ventricle. Biventricular failure. Echocardiogram 09/13/2012 LVEF 20-25%, right ventricle moderately dilated and hypokinetic. 2. New onset of atrial flutter with 2:1 conduction. I did administer intravenous adenosine to confirm presence of underlying atrial flutter. 3. Acute kidney injury and acute renal failure, multifactorial etiology, dehydration, UTI and urosepsis along with prerenal failure from both LV and RV failure. 3. COPD and history of prior tobacco use. Patient's presentation is consistent with biventricular failure and chronic cor pulmonale. 4. Chronic thrombcytopenia 5. Anemia hypoproteinemia and failure to thrive  Recommendation: Although the blood pressure has been low, patient has been asymptomatic, however he would probably benefit from inotropic support. I will start the patient on IV amiodarone to see if I can converted back to sinus rhythm, patient will need anticoagulation, long-term due to presence of underlying atrial flutter, however am not sure whether he'll be compliant overall. I've never seen the patient except when I initially saw him in July of 2014.  Due to renal failure I will have the pharmacy dose milrinone. Content to follow the patient. If the heart rate is not controlled, his congestive heart failure and hypotension may not resolve.  Patient is not a candidate for ICD implantation, he's not had any ischemic workup although I suspect this is probably nonischemic cardiomyopathy, hypertensive heart disease, although being a diabetic patient, but patient is not a candidate at least at this juncture for ischemic workup.    Pamella Pert, MD 11/25/2012, 6:16 PM [facility] Pager: [phone] Office: [phone] If no answer Cell [phone]

## 2012-11-25 NOTE — Progress Notes (Signed)
[facility]  Subjective: No complaints. He denies palpitations. No chest pain or SOB. He has had mild diarrhea but no other GI upset and no confusion.   Objective: Vital signs in last 24 hours: Temp:  [97.5 F (36.4 C)-97.9 F (36.6 C)] 97.7 F (36.5 C) (09/25 1059) Pulse Rate:  [109-114] 113 (09/25 0430) Resp:  [14-19] 17 (09/25 0430) BP: (73-96)/(49-80) 78/55 mmHg (09/25 1200) SpO2:  [97 %-98 %] 97 % (09/25 0430) Weight:  [170 lb 10.2 oz (77.4 kg)] 170 lb 10.2 oz (77.4 kg) (09/25 0400) Last BM Date: 11/25/12  Intake/Output from previous day: 09/24 0701 - 09/25 0700 In: 2455 [P.O.:780; I.V.:1625; IV Piggyback:50] Out: 775 [Urine:775] Intake/Output this shift: Total I/O In: 620 [P.O.:120; I.V.:500] Out: 3 [Stool:3]  Medications Current Facility-Administered Medications  Medication Dose Route Frequency Provider Last Rate Last Dose  . 0.9 %  sodium chloride infusion   Intravenous Continuous Cecille Aver, MD 125 mL/hr at 11/25/12 1143 125 mL/hr at 11/25/12 1143  . acetaminophen (TYLENOL) tablet 650 mg  650 mg Oral Q6H PRN Eduard Clos, MD       Or  . acetaminophen (TYLENOL) suppository 650 mg  650 mg Rectal Q6H PRN Eduard Clos, MD      . insulin aspart (novoLOG) injection 0-9 Units  0-9 Units Subcutaneous TID WC Eduard Clos, MD      . ondansetron [facility]) tablet 4 mg  4 mg Oral Q6H PRN Eduard Clos, MD       Or  . ondansetron (ZOFRAN) injection 4 mg  4 mg Intravenous Q6H PRN Eduard Clos, MD      . piperacillin-tazobactam (ZOSYN) IVPB 2.25 g  2.25 g Intravenous Q8H Saima Rizwan, MD      . pneumococcal 23 valent vaccine (PNU-IMMUNE) injection 0.5 mL  0.5 mL Intramuscular Prior to discharge Esperanza Sheets, MD      . sodium chloride 0.9 % injection 3 mL  3 mL Intravenous Q12H Eduard Clos, MD   3 mL at 11/24/12 0330  . sodium chloride 0.9 % injection 3 mL  3 mL Intravenous Q12H Eduard Clos, MD   3 mL at 11/24/12 1039   Facility-Administered Medications Ordered in Other Encounters  Medication Dose Route Frequency Provider Last Rate Last Dose  . propofol (DIPRIVAN) 10 mg/mL bolus/IV push    Anesthesia Intra-op Garen Lah, CRNA   40 mg at 09/12/12 0000  . succinylcholine (ANECTINE) injection    Anesthesia Intra-op Garen Lah, CRNA   100 mg at 09/12/12 0000    PE: General appearance: alert, cooperative and no distress Lungs: clear to auscultation bilaterally Heart: regular rhythm, fast rate Extremities: no LEE Pulses: 2+ and symmetric Skin: warm and dry Neurologic: Grossly normal  Lab Results:   Recent Labs  11/23/12 2105 11/23/12 2121 11/24/12 0530 11/25/12 0624  WBC 6.5  --  5.3 5.1  HGB 14.3 16.3 13.9 11.8*  HCT 43.0 48.0 41.1 35.0*  PLT 116*  --  123* 95*   BMET  Recent Labs  11/23/12 2105 11/23/12 2121 11/24/12 0530 11/25/12 0624  NA 132* 137 142 135  K 5.8* 5.8* 5.0 3.5  CL 93* 105 101 99  CO2 21  --  21 21  GLUCOSE 86 83 77 97  BUN 134* 129* 133* 123*  CREATININE 5.07* 5.50* 5.02* 4.45*  CALCIUM 8.9  --  8.6 8.0*    Assessment/Plan  Principal Problem:  Digoxin toxicity Active Problems:   Diabetes mellitus without complication   Thrombocytopenia, unspecified   Acute renal failure   Cardiomyopathy  Plan: Digoxin level is trending downward. Last level was 4.9 (5.1 on admission). Hyperkalemia has resolved. K+ is 3.5 today. SCr is down trending. He has had sinus tach and NSVT on telemetry. He has been asymptomatic in terms of his tachycardia. He has had mild diarrhea but no other GI upset. No confusion. Continue holding digoxin.  Continue to trend levels. Keep on telemetry for close monitoring of possible arrythmias. He is hypotensive with SBP in the 70s. He is asymptomatic. NS bolus of 250 mL has been ordred by [facility].   Renal is on board to assist with A/C RF.  MD to follow with additional information.     LOS: 2 days     Tarrell Debes M. Sharol Harness, PA-C 11/25/2012 1:54 PM

## 2012-11-25 NOTE — Progress Notes (Signed)
[facility] Progress Note    Assessment/ Plan:   1. Acute on chronic kidney disease:  - 2 previous episodes of AKI. SCr 1.38 on 10/13/12. AG decreased today, bicarb normal. He has had very poor PO intake and according to computer he has had approx 90lb wt loss over last 2 months concerning for volume depletion with concomitant antihypertensive medication use as well as possible underlying malignancy. SCr trending down (5.07>>5.02>>4.45) - renal u/s negative for hydro or mass  - continue to hold BP meds  -  continue NS @ 125 and monitor volume status closely- just concerned about heart status but right now does not appear to be overloaded - avoid nephrotoxic agents, but his BP meds may have contributed to this with recent acyclovir and UTI - unsure if UOP adequate given multiple incontinence episodes yesterday, continue to measure UOP 2. Hypertension - Treated with antihypertensives per SNF, remains hypotensive (80s systolic)  - continue to hold BP meds  - continue IVF 3. UTI - Asymptomatic but UA on admission with nitrites, leuks, and bacteria. Culture now with >100K GNR. He has recnt h/o ceph resistant enterobacter UTI. - currently on rocephin, will  switch to levaquin 4. Hyperkalemia - Resolved. Admitted with dig toxicity (5.1) likey due to his ARF, he is s/p digifab and kayexalate. Will continue to monitor.  - stop potassium supplement, will resume 20 qday given rapid correction of K 5. Anemia - 11.8, mild and asymptomatic. Likely due to hemodilution as he had normal values in August. Will continue to monitor.   Subjective:   Patient resting in bed comfortably this morning with no complaints. States he has been using his urinal to measure output. Continues to deny any burning or pain with urination and denies any difficulty breathing or cough. Says "hot foods" make him nauseous and lose his appetite.    Objective:   BP 81/58  Pulse 109  Temp(Src) 97.9 F (36.6 C) (Oral)   Resp 19  Ht 6\' 2"  (1.88 m)  Wt 73.5 kg (162 lb 0.6 oz)  BMI 20.8 kg/m2  SpO2 97%  Intake/Output Summary (Last 24 hours) at 11/25/12 7829 Last data filed at 11/25/12 0000  Gross per 24 hour  Intake   1220 ml  Output    400 ml  Net    820 ml   Weight change:   Physical Exam: General appearance - alert, well appearing, and in no distress  Eyes - pupils equal and reactive, extraocular eye movements intact  Mouth - mucous membranes moist, pharynx normal without lesions  Neck - supple, no significant adenopathy  Lymphatics - no palpable lymphadenopathy, no hepatosplenomegaly  Chest - clear to auscultation, no wheezes, rales or rhonchi, symmetric air entry  Heart - normal rate, regular rhythm, normal S1, S2, no murmurs, rubs, clicks or gallops  Abdomen - soft, nontender, nondistended, no masses or organomegaly  Back exam - old dermatomal rash healing on left upper and lower back; dressings covering mid lower back and sacrum Extremities - peripheral pulses normal, no pedal edema, no clubbing or cyanosis, no asterixis  Skin - dressings as above  Imaging: US Renal  11/23/2012   CLINICAL DATA:  Renal failure.  EXAM: RENAL/URINARY TRACT ULTRASOUND COMPLETE  COMPARISON:  Renal ultrasound 10/07/2012 and CT abdomen and pelvis 10/08/2012.  FINDINGS: Right Kidney  Length: Measures 10.9 cm. There is some increase in cortical echogenicity. No stone, mass or hydronephrosis.  Left Kidney  Length: Measures 11.1 cm There is some increase in  cortical echogenicity. No stone, mass or hydronephrosis.  Bladder: Bilateral ureteral jets are identified. No abnormality is seen.  IMPRESSION: Negative for hydronephrosis or acute abnormality.  Mildly increased cortical echogenicity compatible with medical renal disease.   Electronically Signed   By: Drusilla Kanner M.D.   On: 11/23/2012 23:14    Labs: BMET  Recent Labs Lab 11/23/12 2105 11/23/12 2121 11/24/12 0530 11/25/12 0624  NA 132* 137 142 135  K  5.8* 5.8* 5.0 3.5  CL 93* 105 101 99  CO2 21  --  21 21  GLUCOSE 86 83 77 97  BUN 134* 129* 133* 123*  CREATININE 5.07* 5.50* 5.02* 4.45*  CALCIUM 8.9  --  8.6 8.0*  PHOS  --   --  4.4  --    CBC  Recent Labs Lab 11/23/12 2105 11/23/12 2121 11/24/12 0530 11/25/12 0624  WBC 6.5  --  5.3 5.1  NEUTROABS 2.5  --  1.9  --   HGB 14.3 16.3 13.9 11.8*  HCT 43.0 48.0 41.1 35.0*  MCV 76.5*  --  76.5* 75.6*  PLT 116*  --  123* 95*    Medications:    . cefTRIAXone (ROCEPHIN)  IV  1 g Intravenous Q24H  . insulin aspart  0-9 Units Subcutaneous TID WC  . sodium chloride  3 mL Intravenous Q12H  . sodium chloride  3 mL Intravenous Q12H    Lewie Chamber, MS4 11/25/2012, 7:23 AM    I have seen and evaluated this patient and agree with the subjective as documented in the medical student note above. Additionally:  Gen: NAD Heart: rrr Lungs: CTAB Abd: soft, NTTP, +BS Ext: SCD's in place Neuro: grossly nonfocal, speech intact  Less UOP than desirable. Creatinine improving. Seems prerenal, possibly due to sepsis vs. Cardiorenal (has known poor EF). Would consider broadening antibiotic coverage today given that he is growing bacteria in his urine & has grown organism resistant to Rocephin in the recent past.   Levert Feinstein, MD Family Medicine PGY-2 Renal Service Resident   Patient seen and examined, agree with above note with above modifications. Looks better today, creatinine down some and better UOP.  He does not appear to be getting volume overloaded so will continue to give IVF at 125.  Will also restart some K.  Urine culture showing GNR- org in the past resistant to ceph, will change to levaquin. Annie Sable, MD 11/25/2012

## 2012-11-25 NOTE — Progress Notes (Signed)
This patient has been seen by Dr. Jacinto Halim for Cardiology concerns in the past. I have notified Dr. Jacinto Halim of the pending consult.  He will take over Cardiology consultation. [facility] will not follow unless specifically requested.  Marykay Lex, MD

## 2012-11-26 ENCOUNTER — Inpatient Hospital Stay (HOSPITAL_COMMUNITY): Payer: Medicare Other

## 2012-11-26 DIAGNOSIS — T6591XS Toxic effect of unspecified substance, accidental (unintentional), sequela: Secondary | ICD-10-CM

## 2012-11-26 DIAGNOSIS — R570 Cardiogenic shock: Secondary | ICD-10-CM

## 2012-11-26 DIAGNOSIS — T50901S Poisoning by unspecified drugs, medicaments and biological substances, accidental (unintentional), sequela: Secondary | ICD-10-CM

## 2012-11-26 LAB — POCT I-STAT 3, ART BLOOD GAS (G3+)
Bicarbonate: 22 mEq/L (ref 20.0–24.0)
TCO2: 23 mmol/L (ref 0–100)
pCO2 arterial: 44.3 mmHg (ref 35.0–45.0)
pH, Arterial: 7.302 — ABNORMAL LOW (ref 7.350–7.450)

## 2012-11-26 LAB — RENAL FUNCTION PANEL
Albumin: 1.9 g/dL — ABNORMAL LOW (ref 3.5–5.2)
CO2: 22 mEq/L (ref 19–32)
Calcium: 7.7 mg/dL — ABNORMAL LOW (ref 8.4–10.5)
Creatinine, Ser: 3.21 mg/dL — ABNORMAL HIGH (ref 0.50–1.35)
GFR calc non Af Amer: 19 mL/min — ABNORMAL LOW (ref >90)
Glucose, Bld: 102 mg/dL — ABNORMAL HIGH (ref 70–99)
Phosphorus: 2.8 mg/dL (ref 2.3–4.6)
Sodium: 138 mEq/L (ref 135–145)

## 2012-11-26 LAB — BASIC METABOLIC PANEL
CO2: 22 mEq/L (ref 19–32)
Calcium: 7.7 mg/dL — ABNORMAL LOW (ref 8.4–10.5)
Creatinine, Ser: 2.91 mg/dL — ABNORMAL HIGH (ref 0.50–1.35)
GFR calc Af Amer: 25 mL/min — ABNORMAL LOW (ref >90)
GFR calc non Af Amer: 22 mL/min — ABNORMAL LOW (ref >90)
Sodium: 137 mEq/L (ref 135–145)

## 2012-11-26 LAB — GLUCOSE, CAPILLARY
Glucose-Capillary: 127 mg/dL — ABNORMAL HIGH (ref 70–99)
Glucose-Capillary: 90 mg/dL (ref 70–99)
Glucose-Capillary: 124 mg/dL — ABNORMAL HIGH (ref 70–99)
Glucose-Capillary: 127 mg/dL — ABNORMAL HIGH (ref 70–99)

## 2012-11-26 LAB — HEPARIN LEVEL (UNFRACTIONATED): Heparin Unfractionated: 0.16 IU/mL — ABNORMAL LOW (ref 0.30–0.70)

## 2012-11-26 LAB — CBC
Hemoglobin: 11.1 g/dL — ABNORMAL LOW (ref 13.0–17.0)
MCH: 25.4 pg — ABNORMAL LOW (ref 26.0–34.0)
Platelets: 78 10*3/uL — ABNORMAL LOW (ref 150–400)
RBC: 4.37 MIL/uL (ref 4.22–5.81)
WBC: 4.4 10*3/uL (ref 4.0–10.5)

## 2012-11-26 LAB — MAGNESIUM: Magnesium: 1.1 mg/dL — ABNORMAL LOW (ref 1.5–2.5)

## 2012-11-26 MED ORDER — FAMOTIDINE 20 MG PO TABS
20.0000 mg | ORAL_TABLET | Freq: Every day | ORAL | Status: DC
  Administered 2012-11-26 – 2012-12-02 (×7): 20 mg via ORAL
  Filled 2012-11-26 (×7): qty 1

## 2012-11-26 MED ORDER — HEPARIN BOLUS VIA INFUSION
2000.0000 [IU] | Freq: Once | INTRAVENOUS | Status: DC
  Filled 2012-11-26: qty 2000

## 2012-11-26 MED ORDER — POTASSIUM CHLORIDE CRYS ER 20 MEQ PO TBCR
40.0000 meq | EXTENDED_RELEASE_TABLET | Freq: Once | ORAL | Status: AC
  Administered 2012-11-26: 40 meq via ORAL
  Filled 2012-11-26: qty 2

## 2012-11-26 MED ORDER — SODIUM CHLORIDE 0.9 % IV SOLN: 250.0000 mL | INTRAVENOUS | Status: DC

## 2012-11-26 MED ORDER — ONDANSETRON HCL 4 MG/2ML IJ SOLN
4.0000 mg | Freq: Four times a day (QID) | INTRAMUSCULAR | Status: DC | PRN
  Administered 2012-11-26: 4 mg via INTRAVENOUS
  Filled 2012-11-26: qty 2

## 2012-11-26 MED ORDER — PIPERACILLIN-TAZOBACTAM IN DEX 2-0.25 GM/50ML IV SOLN
2.2500 g | Freq: Four times a day (QID) | INTRAVENOUS | Status: DC
  Administered 2012-11-26 – 2012-11-27 (×4): 2.25 g via INTRAVENOUS
  Filled 2012-11-26 (×7): qty 50

## 2012-11-26 MED ORDER — SODIUM CHLORIDE 0.9 % IJ SOLN: 3.0000 mL | INTRAMUSCULAR | Status: DC | PRN

## 2012-11-26 MED ORDER — POTASSIUM CHLORIDE CRYS ER 20 MEQ PO TBCR
20.0000 meq | EXTENDED_RELEASE_TABLET | Freq: Every day | ORAL | Status: DC
  Administered 2012-11-27: 20 meq via ORAL
  Filled 2012-11-26: qty 1

## 2012-11-26 MED ORDER — HYDROCORTISONE 1 % EX CREA
TOPICAL_CREAM | Freq: Four times a day (QID) | CUTANEOUS | Status: DC | PRN
  Filled 2012-11-26: qty 28

## 2012-11-26 MED ORDER — DOPAMINE-DEXTROSE 3.2-5 MG/ML-% IV SOLN
2.5000 ug/kg/min | INTRAVENOUS | Status: DC
  Administered 2012-11-26: 2.5 ug/kg/min via INTRAVENOUS

## 2012-11-26 MED ORDER — PROMETHAZINE HCL 25 MG/ML IJ SOLN
12.5000 mg | INTRAMUSCULAR | Status: DC | PRN
  Filled 2012-11-26: qty 1

## 2012-11-26 MED ORDER — SODIUM CHLORIDE 0.9 % IV BOLUS (SEPSIS)
500.0000 mL | Freq: Once | INTRAVENOUS | Status: AC
  Administered 2012-11-26: 500 mL via INTRAVENOUS

## 2012-11-26 MED ORDER — HEPARIN BOLUS VIA INFUSION
2000.0000 [IU] | Freq: Once | INTRAVENOUS | Status: AC
  Administered 2012-11-26: 2000 [IU] via INTRAVENOUS
  Filled 2012-11-26: qty 2000

## 2012-11-26 MED ORDER — NOREPINEPHRINE BITARTRATE 1 MG/ML IJ SOLN
2.0000 ug/min | INTRAVENOUS | Status: DC
  Administered 2012-11-26: 2 ug/min via INTRAVENOUS
  Administered 2012-11-27: 5 ug/min via INTRAVENOUS
  Filled 2012-11-26 (×3): qty 4

## 2012-11-26 MED ORDER — INSULIN ASPART 100 UNIT/ML ~~LOC~~ SOLN
0.0000 [IU] | Freq: Three times a day (TID) | SUBCUTANEOUS | Status: DC
  Administered 2012-11-27 – 2012-11-28 (×2): 1 [IU] via SUBCUTANEOUS
  Administered 2012-11-28 – 2012-11-29 (×3): 2 [IU] via SUBCUTANEOUS

## 2012-11-26 MED ORDER — ONDANSETRON HCL 4 MG PO TABS: 4.0000 mg | ORAL_TABLET | ORAL | Status: DC | PRN

## 2012-11-26 MED ORDER — SODIUM CHLORIDE 0.9 % IJ SOLN
3.0000 mL | Freq: Two times a day (BID) | INTRAMUSCULAR | Status: DC
  Administered 2012-11-26: 3 mL via INTRAVENOUS

## 2012-11-26 MED ORDER — SODIUM CHLORIDE 0.9 % IV BOLUS (SEPSIS)
500.0000 mL | Freq: Once | INTRAVENOUS | Status: AC
Start: 2012-11-26 — End: 2012-11-26
  Administered 2012-11-26: 500 mL via INTRAVENOUS

## 2012-11-26 MED ORDER — MAGNESIUM SULFATE 40 MG/ML IJ SOLN
2.0000 g | Freq: Once | INTRAMUSCULAR | Status: AC
  Administered 2012-11-26: 2 g via INTRAVENOUS
  Filled 2012-11-26: qty 50

## 2012-11-26 MED ORDER — DOPAMINE-DEXTROSE 3.2-5 MG/ML-% IV SOLN
INTRAVENOUS | Status: AC
  Filled 2012-11-26: qty 250

## 2012-11-26 MED ORDER — DIPHENHYDRAMINE HCL 25 MG PO CAPS
25.0000 mg | ORAL_CAPSULE | Freq: Three times a day (TID) | ORAL | Status: DC | PRN
  Administered 2012-11-26 – 2012-11-28 (×2): 25 mg via ORAL
  Filled 2012-11-26 (×2): qty 1

## 2012-11-26 NOTE — Progress Notes (Signed)
Subjective:  Feels much better. No chest pain, dyspnea or palpitations  Objective:  Vital Signs in the last 24 hours: Temp:  [97.4 F (36.3 C)-97.9 F (36.6 C)] 97.4 F (36.3 C) (09/26 0728) Pulse Rate:  [47-127] 47 (09/26 0728) Resp:  [13-26] 16 (09/26 0728) BP: (64-107)/(46-87) 73/49 mmHg (09/26 0728) SpO2:  [92 %-100 %] 97 % (09/26 0728) Weight:  [80.6 kg (177 lb 11.1 oz)] 80.6 kg (177 lb 11.1 oz) (09/26 0500)  Intake/Output from previous day: 09/25 0701 - 09/26 0700 In: 4370.3 [P.O.:840; I.V.:3380.3; IV Piggyback:150] Out: 730 [Urine:725; Stool:5]  Physical Exam:  General appearance: alert, cooperative, appears older than stated age and no distress  Lungs: clear to auscultation bilaterally  Heart: S1, S2 normal, S3 present and Tachycardia present  Abdomen: soft, non-tender; bowel sounds normal; no masses, no organomegaly  Extremities: extremities normal, atraumatic, no cyanosis or edema  Neurologic: Grossly normal   Lab Results:  Recent Labs  11/25/12 0624 11/26/12 0418  WBC 5.1 4.4  HGB 11.8* 11.1*  PLT 95* 78*    Recent Labs  11/25/12 0624 11/26/12 0418  NA 135 138  K 3.5 3.1*  CL 99 103  CO2 21 22  GLUCOSE 97 102*  BUN 123* 101*  CREATININE 4.45* 3.21*   No results found for this basename: TROPONINI, CK, MB,  in the last 72 hours Hepatic Function Panel  Recent Labs  11/24/12 0530 11/26/12 0418  PROT 7.9  --   ALBUMIN 2.6* 1.9*  AST 46*  --   ALT 28  --   ALKPHOS 95  --   BILITOT 0.6  --   BILIDIR 0.2  --   IBILI 0.4  --     BNP (last 3 results)  Recent Labs  09/25/12 0450 09/26/12 0530 10/06/12 2305  PROBNP 26983.0* 34516.0* 33980.0*      EKG: Atrial flutter with Variable conduction, with a ventricular rate of 100-110 beats a minute. LAD, LAFB, Right branch block, LVH with diffuse ST-T wave changes. Atrial flutter is new compared to the prior EKG.  Echocardiogram 09/13/2012 LVEF 20-25%, right ventricle moderately dilated and  hypokinetic.  Assessment/Plan:  1. Acute on chronic systolic and diastolic heart failure, involving both the right ventricle and left ventricle. Biventricular failure.  Echocardiogram 09/13/2012 LVEF 20-25%, right ventricle moderately dilated and hypokinetic.  2. New onset of atrial flutter with 2:1 conduction. I did administer intravenous adenosine to confirm presence of underlying atrial flutter.  3. Acute kidney injury and acute renal failure, multifactorial etiology, dehydration, UTI and urosepsis along with prerenal failure from both LV and RV failure.  3. COPD and history of prior tobacco use. Patient's presentation is consistent with biventricular failure and chronic cor pulmonale.  4. Chronic thrombcytopenia  5. Anemia hypoproteinemia and failure to thrive  Recommendation: Patient's hemodynamics have improved and his systolic blood pressure has been in greater than 90 mm mercury, urine output has improved and serum creatinine is trending down. We will continue low dose of dopamine keeping in mind that patient has had nonsustained mental tachycardia in the past and also patient has a flutter with episodes of rapid ventricular response, however he is tolerating low dose of dopamine which has been started without titration at 2.5 mcg per kilogram per minute. We will continue Primacor for next 24-48 hours. Continue IV amiodarone for the next 24 hours and transitioned to by mouth. Pharmacy is on board for this. I will consider performing a TEE guided cardioversion on Monday morning to improve  his hemodynamics, there was a question about mural thrombus by echocardiogram that was done in July of 2014, patient may be a candidate for a Eliquis once we know the status of his renal function.  I also discussed the end-of-life issues with the patient, patient although only 22 year age, with biventricular failure, renal failure, uncontrolled diabetes, no significant social support, would not be an appropriate  candidate for heart transplantation and should be a DO NOT RESUSCITATE. Patient is aware of this.  Pamella Pert, M.D. 11/26/2012, 9:13 AM [facility] Pager: [phone] Office: [phone] If no answer: [phone]

## 2012-11-26 NOTE — Progress Notes (Signed)
[facility] Progress Note [place] TEAM 1 - Stepdown ICU Team   Caleb Bauer GNF:[id number] DOB: 07/27/1950 DOA: 11/23/2012 PCP: Bufford Spikes, DO  Brief narrative:  Assessment/Plan: Active Problems:   SIRS/Sepsis -suspect etiology UTI -supportive care: BP soft noting baseline systolic is ~115-109 -cautious hydration given low EF -SBP influenced by A.Flutter and volume status   Atrial Flutter 2:1 conduction -Tele suspicious for AFlutter- 12 lead confirms not in sinus -Cardiology consulted -started on IV Amiodarone -cont Heparin with plans for DCCV next week      Enterobacter UTI (lower urinary tract infection) ->100,000 colonies of cephalosporin resistent enterobacter-considered Levaquin but has slightly prolonged Qtc and h/o VT so d/w phramacist and will use renally adjusted Zosyn    Digoxin toxicity -received digibind with decrease from 5.1 to 1.9 today -given pts underlying CKD 3 and low EF would NOT use digoxin in future    Acute renal failure on CKD (chronic kidney disease) stage 3, GFR 30-59 ml/min -baseline renal function is 37/1.38 -BUN remains markedly elevated with Cr trend down to 3.21 -UOP is ? Marginal although I/O not accurate due to incontinence so will see if can apply condom cath -appreciate renal assistance    Hypotension -combo DH and sepsis- follow -hold offending meds -on low dose Dopamine    Dehydration -cont IVF at 50/hr- d/w PCCM and Cards   Severe nausea -likely due to Amiodarone -increase frequency of Zofran -Add phenergan and Pepcid    Relative hypokalemia -nephro has resumed low dose kcl esp in light of VT history    Diabetes mellitus without complication -cont SSI    Thrombocytopenia, unspecified (chronic)  -chronic but lower than baseline - follow    Diarrhea -combo from colchicine pre admit, ARF and UTI -C Diff negaitve    Chronic systolic congestive heart failure, NYHA class 4 -compensated- notify primary cards  Jacinto Halim) of admit and current poor hemodynamic status -Cards has started Milrinone gtt since hypotensive- BP better in the 90's    Hypertension -now hypotensive so home meds on hold    History of ventricular tachycardia -previously deemed not eligible for ICD per cards (see August 2014 notes)   History of ? Mural thrombus -deemed poor candidate for anticoagulation-defer to Cards    COPD (chronic obstructive pulmonary disease) -compensated   DVT prophylaxis: SCDs Code Status: Full Family Communication: Patient  Disposition Plan/Expected LOS: Stepdown although it does not improve from a hemodynamic standpoint will likely need to be transferred to the ICU Isolation: None Nutritional Status: Acute protein calorie malnutrition related to medical illness  Consultants: Nephrology Cardiology  Procedures: None  Antibiotics: Rocephin 9/23 >>> Zosyn 9/25 >>>   HPI/Subjective: Patient awake but seems somewhat lethargic and complaining of generalized malaise and overall feeling poorly. Does not endorse shortness of breath or chest pain. Family at bedside later in the afternoon and said they were going to get patient something to eat at [facility] but did not realize patient was on a restricted diet based on renal function and cardiac function-this was explained thoroughly to them and they verbalized understanding.  Objective: Blood pressure 73/49, pulse 47, temperature 97.4 F (36.3 C), temperature source Oral, resp. rate 18, height 6\' 2"  (1.88 m), weight 80.6 kg (177 lb 11.1 oz), SpO2 98.00%.  Intake/Output Summary (Last 24 hours) at 11/26/12 1106 Last data filed at 11/26/12 0800  Gross per 24 hour  Intake 3911.02 ml  Output    827 ml  Net 3084.02 ml     Exam:  General: No acute respiratory distress Lungs: Clear to auscultation bilaterally without wheezes or crackles, RA Cardiovascular: Regular rate and rhythm without murmur gallop or rub normal S1 and S2, no peripheral edema or  JVD-systolic blood pressure down in the 70s and 80s earlier noting baseline systolic blood pressure between 109 and 1:15 per last discharge summary Abdomen: Nontender, nondistended, soft, bowel sounds positive, no rebound, no ascites, no appreciable mass Musculoskeletal: No significant cyanosis, clubbing of bilateral lower extremities Neurological: Awake and oriented x 3, moves all extremities x 4 without focal neurological deficits, CN 2-12 intact  Scheduled Meds:  Scheduled Meds: . famotidine  20 mg Oral Daily  . insulin aspart  0-9 Units Subcutaneous TID WC  . midodrine  5 mg Oral TID WC  . piperacillin-tazobactam (ZOSYN)  IV  2.25 g Intravenous Q6H  . [START ON 11/27/2012] potassium chloride  20 mEq Oral Daily  . sodium chloride  3 mL Intravenous Q12H  . sodium chloride  3 mL Intravenous Q12H  . sodium chloride  3 mL Intravenous Q12H   Continuous Infusions: . sodium chloride 20 mL/hr at 11/26/12 1057  . sodium chloride    . amiodarone (NEXTERONE PREMIX) 360 mg/200 mL dextrose 30 mg/hr (11/26/12 0800)  . DOPamine 2.5 mcg/kg/min (11/26/12 0800)  . heparin 1,250 Units/hr (11/26/12 0800)  . milrinone 0.125 mcg/kg/min (11/26/12 0800)    **Reviewed in detail by the Attending Physicia  Data Reviewed: Basic Metabolic Panel:  Recent Labs Lab 11/23/12 2105 11/23/12 2121 11/24/12 0530 11/25/12 0624 11/26/12 0418  NA 132* 137 142 135 138  K 5.8* 5.8* 5.0 3.5 3.1*  CL 93* 105 101 99 103  CO2 21  --  21 21 22   GLUCOSE 86 83 77 97 102*  BUN 134* 129* 133* 123* 101*  CREATININE 5.07* 5.50* 5.02* 4.45* 3.21*  CALCIUM 8.9  --  8.6 8.0* 7.7*  MG  --   --   --   --  1.1*  PHOS  --   --  4.4  --  2.8   Liver Function Tests:  Recent Labs Lab 11/23/12 2105 11/24/12 0530 11/26/12 0418  AST 39* 46*  --   ALT 26 28  --   ALKPHOS 101 95  --   BILITOT 0.6 0.6  --   PROT 8.5* 7.9  --   ALBUMIN 2.6* 2.6* 1.9*   No results found for this basename: LIPASE, AMYLASE,  in the last 168  hours No results found for this basename: AMMONIA,  in the last 168 hours CBC:  Recent Labs Lab 11/23/12 2105 11/23/12 2121 11/24/12 0530 11/25/12 0624 11/26/12 0418  WBC 6.5  --  5.3 5.1 4.4  NEUTROABS 2.5  --  1.9  --   --   HGB 14.3 16.3 13.9 11.8* 11.1*  HCT 43.0 48.0 41.1 35.0* 33.1*  MCV 76.5*  --  76.5* 75.6* 75.7*  PLT 116*  --  123* 95* 78*   Cardiac Enzymes: No results found for this basename: CKTOTAL, CKMB, CKMBINDEX, TROPONINI,  in the last 168 hours BNP (last 3 results)  Recent Labs  09/25/12 0450 09/26/12 0530 10/06/12 2305  PROBNP 26983.0* 34516.0* 33980.0*   CBG:  Recent Labs Lab 11/25/12 0553 11/25/12 0727 11/25/12 1056 11/25/12 1738 11/25/12 2145  GLUCAP 92 104* 116* 120* 127*    Recent Results (from the past 240 hour(s))  URINE CULTURE     Status: None   Collection Time    11/23/12  9:04 PM  Result Value Range Status   Specimen Description URINE, RANDOM   Final   Special Requests NONE   Final   Culture  Setup Time     Final   Value: 11/24/2012 01:28     Performed at [facility]   Colony Count     Final   Value: >=100,000 COLONIES/ML     Performed at [facility]   Culture     Final   Value: ENTEROBACTER AEROGENES     Performed at [facility]   Report Status 11/25/2012 FINAL   Final   Organism ID, Bacteria ENTEROBACTER AEROGENES   Final  CULTURE, BLOOD (ROUTINE X 2)     Status: None   Collection Time    11/23/12 10:10 PM      Result Value Range Status   Specimen Description BLOOD LEFT HAND   Final   Special Requests BOTTLES DRAWN AEROBIC ONLY   Final   Culture  Setup Time     Final   Value: 11/24/2012 01:01     Performed at [facility]   Culture     Final   Value:        BLOOD CULTURE RECEIVED NO GROWTH TO DATE CULTURE WILL BE HELD FOR 5 DAYS BEFORE ISSUING A FINAL NEGATIVE REPORT     Performed at [facility]   Report Status PENDING   Incomplete  CULTURE, BLOOD (ROUTINE X 2)      Status: None   Collection Time    11/23/12 10:15 PM      Result Value Range Status   Specimen Description BLOOD RIGHT HAND   Final   Special Requests BOTTLES DRAWN AEROBIC ONLY   Final   Culture  Setup Time     Final   Value: 11/24/2012 01:01     Performed at [facility]   Culture     Final   Value:        BLOOD CULTURE RECEIVED NO GROWTH TO DATE CULTURE WILL BE HELD FOR 5 DAYS BEFORE ISSUING A FINAL NEGATIVE REPORT     Performed at [facility]   Report Status PENDING   Incomplete  CLOSTRIDIUM DIFFICILE BY PCR     Status: None   Collection Time    11/24/12  3:31 AM      Result Value Range Status   C difficile by pcr NEGATIVE  NEGATIVE Final     Studies:  Recent x-ray studies have been reviewed in detail by the Attending Physician    Junious Silk, ANP [facility] Office  [phone] Pager [phone]  **If unable to reach the above provider after paging please contact the Flow Manager @ [phone]  On-Call/Text Page:      [email or web address]      password [email or web address]  If 7PM-7AM, please contact night-coverage [email or web address] Password [email or web address] 11/26/2012, 11:06 AM   LOS: 3 days   I have examined the patient, reviewed the chart and modified the above note which I agree with.   Orly Quimby,MD [phone] 11/26/2012, 5:41 PM

## 2012-11-26 NOTE — Progress Notes (Signed)
eLink Physician-Brief Progress Note Patient Name: EWELL BENASSI DOB: 1950/12/22 MRN: [id number]  Date of Service  11/26/2012   HPI/Events of Note   RN reports healed herpetic lesions on back -noted in H&P  eICU Interventions  Contact isolation until rounding team can clarify in am   Intervention Category Minor Interventions: Routine modifications to care plan (e.g. PRN medications for pain, fever)  ALVA,RAKESH V. 11/26/2012, 7:31 PM

## 2012-11-26 NOTE — Progress Notes (Signed)
PCCM  Pt now in 60s SBP.  Will need to start levophed and tfr to  ICU bed.  See orders.  Luisa Hart WrightMD

## 2012-11-26 NOTE — Progress Notes (Signed)
ANTIBIOTIC CONSULT NOTE   Pharmacy Consult for Zosyn Indication: enterobacter UTI  Allergies  Allergen Reactions  . Other Nausea And Vomiting    Patient stated drug is "kerein"    Patient Measurements: Height: 6\' 2"  (188 cm) Weight: 177 lb 11.1 oz (80.6 kg) IBW/kg (Calculated) : 82.2  Vital Signs: Temp: 97.4 F (36.3 C) (09/26 0728) Temp src: Oral (09/26 0728) BP: 73/49 mmHg (09/26 0728) Pulse Rate: 47 (09/26 0728) Intake/Output from previous day: 09/25 0701 - 09/26 0700 In: 4370.3 [P.O.:840; I.V.:3380.3; IV Piggyback:150] Out: 730 [Urine:725; Stool:5] Intake/Output from this shift: Total I/O In: 160.7 [I.V.:160.7] Out: 100 [Emesis/NG output:100]  Labs:  Recent Labs  11/23/12 2105  11/24/12 0330 11/24/12 0530 11/25/12 0624 11/26/12 0418  WBC 6.5  --   --  5.3 5.1 4.4  HGB 14.3  < >  --  13.9 11.8* 11.1*  PLT 116*  --   --  123* 95* 78*  LABCREA  --   --  98.12  --   --   --   CREATININE 5.07*  < >  --  5.02* 4.45* 3.21*  < > = values in this interval not displayed. Estimated Creatinine Clearance: 27.2 ml/min (by C-G formula based on Cr of 3.21). No results found for this basename: VANCOTROUGH, Leodis Binet, VANCORANDOM, GENTTROUGH, GENTPEAK, GENTRANDOM, TOBRATROUGH, TOBRAPEAK, TOBRARND, AMIKACINPEAK, AMIKACINTROU, AMIKACIN,  in the last 72 hours   Microbiology: Recent Results (from the past 720 hour(s))  URINE CULTURE     Status: None   Collection Time    11/23/12  9:04 PM      Result Value Range Status   Specimen Description URINE, RANDOM   Final   Special Requests NONE   Final   Culture  Setup Time     Final   Value: 11/24/2012 01:28     Performed at [facility] Count     Final   Value: >=100,000 COLONIES/ML     Performed at [facility]   Culture     Final   Value: ENTEROBACTER AEROGENES     Performed at [facility]   Report Status 11/25/2012 FINAL   Final   Organism ID, Bacteria ENTEROBACTER AEROGENES   Final   CULTURE, BLOOD (ROUTINE X 2)     Status: None   Collection Time    11/23/12 10:10 PM      Result Value Range Status   Specimen Description BLOOD LEFT HAND   Final   Special Requests BOTTLES DRAWN AEROBIC ONLY   Final   Culture  Setup Time     Final   Value: 11/24/2012 01:01     Performed at [facility]   Culture     Final   Value:        BLOOD CULTURE RECEIVED NO GROWTH TO DATE CULTURE WILL BE HELD FOR 5 DAYS BEFORE ISSUING A FINAL NEGATIVE REPORT     Performed at [facility]   Report Status PENDING   Incomplete  CULTURE, BLOOD (ROUTINE X 2)     Status: None   Collection Time    11/23/12 10:15 PM      Result Value Range Status   Specimen Description BLOOD RIGHT HAND   Final   Special Requests BOTTLES DRAWN AEROBIC ONLY   Final   Culture  Setup Time     Final   Value: 11/24/2012 01:01     Performed at [facility]  Final   Value:        BLOOD CULTURE RECEIVED NO GROWTH TO DATE CULTURE WILL BE HELD FOR 5 DAYS BEFORE ISSUING A FINAL NEGATIVE REPORT     Performed at [facility]   Report Status PENDING   Incomplete  CLOSTRIDIUM DIFFICILE BY PCR     Status: None   Collection Time    11/24/12  3:31 AM      Result Value Range Status   C difficile by pcr NEGATIVE  NEGATIVE Final    Medical History: Past Medical History  Diagnosis Date  . Diabetes mellitus without complication   . CHF (congestive heart failure)   . Hypertension   . Cardiomyopathy, dilated   . Small bowel obstruction 10/2012  . COPD (chronic obstructive pulmonary disease)    Assessment: 62 y/o M here with A/CRF, digoxin toxicity, possible UTI. WBC 5.3>4.4, afebrile, renal function improving at Scr 3.21<4.45<5.02 with CrCl ~27. Levaquin not indicated due to prolonged QT/QTc 450/580.  Ceftriaxone 9/23>>9/24 Zosyn 9/25>>  9/25 Urine>>Enterobacter aerogenes (R-CTX, S-Levaquin, Cipro, Zosyn, Bactrim) 9/25 Blood x 2>>pend  Goal of Therapy:  Eradication of  infection  Plan:  -Will change to Zosyn 2.25g IV q6h due to improved renal fxn -Trend WBC, temp -F/U renal function, may be able to switch to EI dosing if renal function continues to improve (CrCl >40) -F/U LOT  Thank you for allowing me to take part in this patient's care,  Merceda Elks PharmD Candidate  11/26/2012 10:53 AM   Agree with the above note.   Margie Billet, PharmD Clinical Pharmacist - Resident Pager: [phone] Pharmacy: [phone] 11/26/2012 1:44 PM

## 2012-11-26 NOTE — Progress Notes (Signed)
PULMONARY  / CRITICAL CARE MEDICINE  Name: Caleb Bauer MRN: [id number] DOB: 01/20/1951    ADMISSION DATE:  11/23/2012 CONSULTATION DATE:  11/25/2012  REFERRING MD :  [facility] PRIMARY SERVICE: TRH  CHIEF COMPLAINT:  Hypotension  BRIEF PATIENT DESCRIPTION: 62 year old male with PMH of CRI stage 3 and marginal BP at baseline presenting to the hospital with dig toxicity.  Was admitted to SDU, renal function continued to deteriorate, on 9/25 patient was noted to be hypotensive and PCCM was called on consultation.  Of note, the patient is being treated for UTI.  He also has diarrhea with high doses of kayexalate and C. Diff is negative.  SIGNIFICANT EVENTS / STUDIES:  9/25 digoxin toxicity.  LINES / TUBES: PIV  CULTURES: Blood 9/23>>>NTD Urine 9/23>>>Enterobacter aerogenes sensitive only to bactrim, zosyn and levaquin.  ANTIBIOTICS: Zosyn 9/24>>>  SUBJECTIVE:  Better since BP higher  VITAL SIGNS: Temp:  [97.4 F (36.3 C)-97.9 F (36.6 C)] 97.4 F (36.3 C) (09/26 0728) Pulse Rate:  [47-127] 47 (09/26 0728) Resp:  [13-26] 16 (09/26 0728) BP: (64-107)/(46-87) 73/49 mmHg (09/26 0728) SpO2:  [92 %-100 %] 97 % (09/26 0728) Weight:  [80.6 kg (177 lb 11.1 oz)] 80.6 kg (177 lb 11.1 oz) (09/26 0500) HEMODYNAMICS: Bp 90s, on 2.103mcg DA  On milrinone drip and amiod drip    On RA   INTAKE / OUTPUT: Intake/Output     09/25 0701 - 09/26 0700 09/26 0701 - 09/27 0700   P.O. 840    I.V. (mL/kg) 3380.3 (41.9) 160.7 (2)   IV Piggyback 150    Total Intake(mL/kg) 4370.3 (54.2) 160.7 (2)   Urine (mL/kg/hr) 725 (0.4)    Emesis/NG output  100 (0.4)   Stool 5 (0)    Total Output 730 100   Net +3640.3 +60.7        Stool Occurrence 1 x      PHYSICAL EXAMINATION: General:  Chronically ill appearing male, NAD. Neuro:  Alert, oriented and interactive, moving all ext to command. HEENT:  [place]/AT, PERRL, EOM-I and MMM. Cardiovascular:  RRR, Nl S1/S2, -M/R/G. Lungs:  CTA bilaterally. Abdomen:   Soft, NT, ND and +BS. Musculoskeletal:  -edema and -tenderness. Skin:  Intact.  LABS:  CBC Recent Labs     11/24/12  0530  11/25/12  0624  11/26/12  0418  WBC  5.3  5.1  4.4  HGB  13.9  11.8*  11.1*  HCT  41.1  35.0*  33.1*  PLT  123*  95*  78*   Coag's No results found for this basename: APTT, INR,  in the last 72 hours  BMET Recent Labs     11/24/12  0530  11/25/12  0624  11/26/12  0418  NA  142  135  138  K  5.0  3.5  3.1*  CL  101  99  103  CO2  21  21  22   BUN  133*  123*  101*  CREATININE  5.02*  4.45*  3.21*  GLUCOSE  77  97  102*   Electrolytes Recent Labs     11/24/12  0530  11/25/12  0624  11/26/12  0418  CALCIUM  8.6  8.0*  7.7*  MG   --    --   1.1*  PHOS  4.4   --   2.8   Sepsis Markers No results found for this basename: LACTICACIDVEN, PROCALCITON, O2SATVEN,  in the last 72 hours  ABG Recent Labs  11/23/12  2303  PHART  7.383  PCO2ART  39.0  PO2ART  92.3   Liver Enzymes Recent Labs     11/23/12  2105  11/24/12  0530  11/26/12  0418  AST  39*  46*   --   ALT  26  28   --   ALKPHOS  101  95   --   BILITOT  0.6  0.6   --   ALBUMIN  2.6*  2.6*  1.9*   Cardiac Enzymes No results found for this basename: TROPONINI, PROBNP,  in the last 72 hours  Glucose Recent Labs     11/24/12  2134  11/25/12  0553  11/25/12  0727  11/25/12  1056  11/25/12  1738  11/25/12  2145  GLUCAP  83  92  104*  116*  120*  127*    Imaging Dg Chest Port 1 View  11/25/2012   CLINICAL DATA:  Diabetes, hypertension, COPD, dilated cardiomyopathy ; digoxin toxicity, CHF, question pneumonia  EXAM: PORTABLE CHEST - 1 VIEW  COMPARISON:  Portable exam 1718 hr compared to 10/06/2012  FINDINGS: Enlargement of cardiac silhouette with pulmonary vascular congestion.  Tortuous aorta.  No acute failure or consolidation.  Significantly improved aeration at left base since previous exam.  No pleural effusion or pneumothorax.  Bones unremarkable.  IMPRESSION:  Enlargement of cardiac silhouette with pulmonary vascular congestion.  No acute abnormalities.   Electronically Signed   By: Ulyses Southward M.D.   On: 11/25/2012 17:28     CXR: 9/25: CM, pulm vasc congestion  ASSESSMENT / PLAN:  PULMONARY A: No active issues, however, watch with low EF, renal failure and the amount of fluid being given.  COPD history. P:   - Titrate O2 for sats. - IS per RT protocol. - Bronchodilators as needed.  CARDIOVASCULAR A: Systolic CHF with low EF.  Cardiogenic shock  P:  - watch IVF -place CVL>>>needs access -milrinone drip -renal dose DA -cont amiod  Per cards  RENAL A:  Acute on chronic renal failure. P:   - Renal following, will defer to the renal service.  GASTROINTESTINAL A:  Diarrhea.  Likely related to high dose kayexalate. P:   - No more kayexalate given. - C. Diff negative. - Would monitor and not treat for now.  HEMATOLOGIC A:  No active issues. P:  - CBC as needed.  INFECTIOUS A:  Enterobacter UTI.  Lactic acid normal P:   - Continue zosyn.  ENDOCRINE A:  DM.   P:   - Per primary.  NEUROLOGIC A:  No active issues. P:   - Monitor.  TODAY'S SUMMARY: 62 year old male with PMH of CRI, presenting to PCCM for hypotension.  Needs better IV access, Place CVL, no PICC with ARF/CKD  I have personally obtained a history, examined the patient, evaluated laboratory and imaging results, formulated the assessment and plan and placed orders.  Dorcas Carrow Beeper  [phone]  Cell  [phone]  If no response or cell goes to voicemail, call beeper [phone] Pulmonary and Critical Care Medicine [facility] Pager: [phone]  11/26/2012, 10:00 AM

## 2012-11-26 NOTE — Progress Notes (Addendum)
This is Dr. Verl Dicker patient and he has seen Mr. Caleb Bauer today. No charge was rendered from our group today.  Chrystie Nose, MD, [facility] Attending Cardiologist [facility]

## 2012-11-26 NOTE — Progress Notes (Signed)
[facility] Progress Note    Assessment/ Plan:   1. Acute on chronic kidney disease: - 2 previous episodes of AKI. SCr 1.38 on 10/13/12. AG almost closed, bicarb normal. Continues to have very poor PO intake. SCr down since admission (5.07>>3.21). Cardiology now seeing patient with inotrope started for biventricular failure and amiodarone to try to convert to SR. PCCM also on board and has recently started midodrine for low BP. - renal u/s negative for hydro or mass  - continue to hold BP meds  - change NS to 50cc/hr per PCCM rec.  Actually will just stop as he is much more in than out since being here - avoid nephrotoxic agents, but his BP meds may have contributed to this with recent acyclovir and UTI  - UOP low despite condom cath 2. Hypertension - Treated with antihypertensives per SNF, remains hypotensive (70-90s systolic)  - continue to hold BP meds  3. Hypotension - likely multifactorial in setting of his CHF, UTI, poor PO intake, extreme weight loss - continue IVF - midodrine started, per PCCM - milrinone started at renal dosing for heart failure, per cardiology and dopa and amio- with plans for cardioversion 3. UTI - Asymptomatic but UA on admission with nitrites, leuks, and bacteria. Culture positive enterobacter, sensitive to zosyn. - continue zosyn per pharmacy 4. Hyperkalemia - Resolved. Admitted with dig toxicity (5.1) likey due to his ARF, he is s/p digifab and kayexalate. Now hypokalemic (3.1) Given 40 mEq x 1 this am. - continue KDur 20 mEq daily 5. Anemia - 11.1, mild and asymptomatic. Likely due to hemodilution as he had normal values in August. Will continue to monitor 6. Thrombocytopenia- after being started on heparin.  Will check HIT panel and follow  Subjective:   Patient states just a minor episode of vomiting this am due to trying to swallow some pills. States he back itches but otherwise no complaints. Still with very poor appetite.    Objective:    BP 86/57  Pulse 127  Temp(Src) 97.4 F (36.3 C) (Oral)  Resp 18  Ht 6\' 2"  (1.88 m)  Wt 80.6 kg (177 lb 11.1 oz)  BMI 22.8 kg/m2  SpO2 100%  Intake/Output Summary (Last 24 hours) at 11/26/12 0735 Last data filed at 11/26/12 0700  Gross per 24 hour  Intake 4102.72 ml  Output    730 ml  Net 3372.72 ml   Weight change: 3.2 kg (7 lb 0.9 oz)  Physical Exam: General appearance - alert, well appearing, and in no distress  Eyes - pupils equal and reactive, extraocular eye movements intact  Mouth - mucous membranes moist, pharynx normal without lesions  Neck - supple, no significant adenopathy  Lymphatics - no palpable lymphadenopathy, no hepatosplenomegaly  Chest - clear to auscultation, no wheezes, rales or rhonchi, symmetric air entry  Heart - tachycardic, regular rhythm, normal S1, S2  Abdomen - soft, nontender, nondistended, no masses or organomegaly  Back exam - old dermatomal rash healing on left upper and lower back; dressings covering mid lower back and sacrum  Extremities - peripheral pulses normal, no pedal edema, no clubbing or cyanosis, no asterixis  Skin - dressings as above  Imaging: Dg Chest Port 1 View  11/25/2012   CLINICAL DATA:  Diabetes, hypertension, COPD, dilated cardiomyopathy ; digoxin toxicity, CHF, question pneumonia  EXAM: PORTABLE CHEST - 1 VIEW  COMPARISON:  Portable exam 1718 hr compared to 10/06/2012  FINDINGS: Enlargement of cardiac silhouette with pulmonary vascular congestion.  Tortuous  aorta.  No acute failure or consolidation.  Significantly improved aeration at left base since previous exam.  No pleural effusion or pneumothorax.  Bones unremarkable.  IMPRESSION: Enlargement of cardiac silhouette with pulmonary vascular congestion.  No acute abnormalities.   Electronically Signed   By: Ulyses Southward M.D.   On: 11/25/2012 17:28    Labs: BMET  Recent Labs Lab 11/23/12 2105 11/23/12 2121 11/24/12 0530 11/25/12 0624 11/26/12 0418  NA 132* 137 142  135 138  K 5.8* 5.8* 5.0 3.5 3.1*  CL 93* 105 101 99 103  CO2 21  --  21 21 22   GLUCOSE 86 83 77 97 102*  BUN 134* 129* 133* 123* 101*  CREATININE 5.07* 5.50* 5.02* 4.45* 3.21*  CALCIUM 8.9  --  8.6 8.0* 7.7*  PHOS  --   --  4.4  --  2.8   CBC  Recent Labs Lab 11/23/12 2105 11/23/12 2121 11/24/12 0530 11/25/12 0624 11/26/12 0418  WBC 6.5  --  5.3 5.1 4.4  NEUTROABS 2.5  --  1.9  --   --   HGB 14.3 16.3 13.9 11.8* 11.1*  HCT 43.0 48.0 41.1 35.0* 33.1*  MCV 76.5*  --  76.5* 75.6* 75.7*  PLT 116*  --  123* 95* 78*    Medications:    Scheduled Meds: . insulin aspart  0-9 Units Subcutaneous TID WC  . midodrine  5 mg Oral TID WC  . piperacillin-tazobactam (ZOSYN)  IV  2.25 g Intravenous Q8H  . sodium chloride  3 mL Intravenous Q12H  . sodium chloride  3 mL Intravenous Q12H   Continuous Infusions: . sodium chloride 125 mL/hr at 11/26/12 0254  . amiodarone (NEXTERONE PREMIX) 360 mg/200 mL dextrose 30 mg/hr (11/26/12 0800)  . DOPamine 2.5 mcg/kg/min (11/26/12 0800)  . heparin 1,250 Units/hr (11/26/12 0800)  . milrinone 0.125 mcg/kg/min (11/26/12 0800)   PRN Meds:.acetaminophen, acetaminophen, diphenhydrAMINE, ondansetron (ZOFRAN) IV, ondansetron, pneumococcal 23 valent vaccine  Lewie Chamber, MS4 11/26/2012, 7:35 AM   I have seen and evaluated this patient and agree with the subjective as documented in the medical student note above. Additionally:  Gen: NAD Heart: mildly tachycardic regular rhythm Lungs: CTAB, NWOB Abd: soft, nontender to palpation, +BS Ext: SCD's in place, calves nontender Neuro: grossly nonfocal, speech intact  Some emesis this morning. Creatinine continues to improve. Needs K supplementation, will order daily starting tomorrow (already got today). Urine cx with enterobacter sensitive to zosyn. Remains hypotensive, but has started on milrinone & amiodarone for atrial flutter. Hopefully will improve BP's.  Levert Feinstein, MD Family  Medicine PGY-2 Renal Service Resident   Patient seen and examined, agree with above note with above modifications. AKI is improving and UOP is increasing.  There is still a renal perfusion issue but now more attributed to heart failure rather than volume depletion, will stop IVF as he is much more in than out.  Also have noticed low platelets, will check HIT panel, on heparin.  Replete Earney Navy, MD 11/26/2012

## 2012-11-26 NOTE — Progress Notes (Signed)
Pt transferred to 2H05 per MD order. Report called to receiving nurse and all questions answered. Levophed started prior to transfer.

## 2012-11-26 NOTE — Progress Notes (Signed)
eLink Physician-Brief Progress Note Patient Name: Caleb Bauer DOB: [birth year] MRN: [id number]  Date of Service  11/26/2012   HPI/Events of Note  Hypokalemia and hypomag   eICU Interventions  Mag and potassium replaced   Intervention Category Intermediate Interventions: Electrolyte abnormality - evaluation and management  Kurstin Dimarzo 11/26/2012, 5:13 AM

## 2012-11-26 NOTE — Procedures (Signed)
Central Venous Catheter Insertion Procedure Note Caleb Bauer [id number] 09/11/1950  Procedure: Insertion of Central Venous Catheter Indications: Assessment of intravascular volume, Drug and/or fluid administration and Frequent blood sampling  Procedure Details Consent: Risks of procedure as well as the alternatives and risks of each were explained to the (patient/caregiver).  Consent for procedure obtained. Time Out: Verified patient identification, verified procedure, site/side was marked, verified correct patient position, special equipment/implants available, medications/allergies/relevent history reviewed, required imaging and test results available.  Performed Real time [place] used to ID and cannulate the vessel  Maximum sterile technique was used including antiseptics, cap, gloves, gown, hand hygiene, mask and sheet. Skin prep: Chlorhexidine; local anesthetic administered A antimicrobial bonded/coated triple lumen catheter was placed in the right internal jugular vein using the Seldinger technique.  Evaluation Blood flow good Complications: No apparent complications Patient did tolerate procedure well. Chest X-ray ordered to verify placement.  CXR: pending.  BABCOCK,PETE 11/26/2012, 12:28 PM  I was present for this procedure Luisa Hart WrightMD

## 2012-11-26 NOTE — Progress Notes (Signed)
ANTICOAGULATION CONSULT NOTE - Follow Up Consult  Pharmacy Consult for heparin Indication: atrial fibrillation  Labs:  Recent Labs  11/24/12 0530 11/25/12 0624 11/26/12 0418  HGB 13.9 11.8* 11.1*  HCT 41.1 35.0* 33.1*  PLT 123* 95* PENDING  HEPARINUNFRC  --   --  0.16*  CREATININE 5.02* 4.45* 3.21*    Assessment: 62yo male subtherapeutic on heparin with initial dosing for Afib.  Goal of Therapy:  Heparin level 0.3-0.7 units/ml   Plan:  Will rebolus with heparin 2000 units x1 and increase gtt by 3 units/kg/hr to 1250 units/hr and check level in 8hr.  Caleb Bauer, PharmD, BCPS  11/26/2012,5:30 AM

## 2012-11-26 NOTE — Progress Notes (Signed)
ANTICOAGULATION CONSULT NOTE - Follow Up Consult  Pharmacy Consult for heparin Indication: atrial fibrillation  Labs:  Recent Labs  11/24/12 0530 11/25/12 0624 11/26/12 0418 11/26/12 1400 11/26/12 1900  HGB 13.9 11.8* 11.1*  --   --   HCT 41.1 35.0* 33.1*  --   --   PLT 123* 95* 78*  --   --   HEPARINUNFRC  --   --  0.16*  --  <0.10*  CREATININE 5.02* 4.45* 3.21* 2.91*  --     Assessment: 62yo male now undetectable on heparin despite rate increase; per RN there is oozing around gums.  Goal of Therapy:  Heparin level 0.3-0.7 units/ml   Plan:  Will increase gtt by 3 units/kg/hr to 1600 units/hr and check level in 8hr.  Caleb Bauer, PharmD, BCPS  11/26/2012,11:12 PM

## 2012-11-26 NOTE — Progress Notes (Signed)
Pt recent b/p now ranging in the low to mid 60 systolic, Dr. Delford Field with CCM called and new orders received to start levophed gtt and transfer to ICU. Dr. Alphonsa Gin and Dr. Jacinto Halim also made aware of change and orders to transfer.

## 2012-11-27 ENCOUNTER — Encounter (HOSPITAL_COMMUNITY): Payer: Self-pay | Admitting: *Deleted

## 2012-11-27 LAB — GLUCOSE, CAPILLARY
Glucose-Capillary: 109 mg/dL — ABNORMAL HIGH (ref 70–99)
Glucose-Capillary: 113 mg/dL — ABNORMAL HIGH (ref 70–99)
Glucose-Capillary: 138 mg/dL — ABNORMAL HIGH (ref 70–99)

## 2012-11-27 LAB — CBC
Hemoglobin: 11 g/dL — ABNORMAL LOW (ref 13.0–17.0)
MCH: 25.3 pg — ABNORMAL LOW (ref 26.0–34.0)
MCV: 74.9 fL — ABNORMAL LOW (ref 78.0–100.0)
RBC: 4.35 MIL/uL (ref 4.22–5.81)
RDW: 17.9 % — ABNORMAL HIGH (ref 11.5–15.5)
WBC: 6.3 10*3/uL (ref 4.0–10.5)

## 2012-11-27 LAB — HEPARIN LEVEL (UNFRACTIONATED): Heparin Unfractionated: 0.12 IU/mL — ABNORMAL LOW (ref 0.30–0.70)

## 2012-11-27 LAB — COMPREHENSIVE METABOLIC PANEL
ALT: 30 U/L (ref 0–53)
AST: 34 U/L (ref 0–37)
Albumin: 1.8 g/dL — ABNORMAL LOW (ref 3.5–5.2)
Calcium: 8 mg/dL — ABNORMAL LOW (ref 8.4–10.5)
Chloride: 107 mEq/L (ref 96–112)
Creatinine, Ser: 2.42 mg/dL — ABNORMAL HIGH (ref 0.50–1.35)
GFR calc non Af Amer: 27 mL/min — ABNORMAL LOW (ref >90)
Total Bilirubin: 0.5 mg/dL (ref 0.3–1.2)
Total Protein: 6.1 g/dL (ref 6.0–8.3)

## 2012-11-27 MED ORDER — POTASSIUM CHLORIDE 10 MEQ/50ML IV SOLN
INTRAVENOUS | Status: AC
  Administered 2012-11-27: 10 meq via INTRAVENOUS
  Filled 2012-11-27: qty 200

## 2012-11-27 MED ORDER — MAGNESIUM SULFATE 40 MG/ML IJ SOLN
2.0000 g | Freq: Once | INTRAMUSCULAR | Status: AC
  Administered 2012-11-27: 2 g via INTRAVENOUS
  Filled 2012-11-27: qty 50

## 2012-11-27 MED ORDER — POTASSIUM CHLORIDE CRYS ER 20 MEQ PO TBCR
40.0000 meq | EXTENDED_RELEASE_TABLET | Freq: Two times a day (BID) | ORAL | Status: DC
  Administered 2012-11-27: 40 meq via ORAL
  Filled 2012-11-27 (×3): qty 2

## 2012-11-27 MED ORDER — SODIUM CHLORIDE 0.9 % IV SOLN
INTRAVENOUS | Status: DC
  Administered 2012-11-28 (×2): via INTRAVENOUS

## 2012-11-27 MED ORDER — POTASSIUM CHLORIDE 10 MEQ/50ML IV SOLN
10.0000 meq | INTRAVENOUS | Status: AC
  Administered 2012-11-27 (×4): 10 meq via INTRAVENOUS

## 2012-11-27 MED ORDER — HYDROCORTISONE SOD SUCCINATE 100 MG IJ SOLR
50.0000 mg | Freq: Four times a day (QID) | INTRAMUSCULAR | Status: DC
  Administered 2012-11-27 – 2012-11-28 (×7): 50 mg via INTRAVENOUS
  Administered 2012-11-29: 09:00:00 via INTRAVENOUS
  Administered 2012-11-29: 50 mg via INTRAVENOUS
  Filled 2012-11-27 (×13): qty 1

## 2012-11-27 MED ORDER — PIPERACILLIN-TAZOBACTAM 3.375 G IVPB
3.3750 g | Freq: Three times a day (TID) | INTRAVENOUS | Status: DC
  Administered 2012-11-27 – 2012-12-02 (×15): 3.375 g via INTRAVENOUS
  Filled 2012-11-27 (×19): qty 50

## 2012-11-27 NOTE — Progress Notes (Signed)
Subjective:  Pt moved to 2H- was more hypotensive, CCM now involved.  Started on norepi but is still on milrinone and amio.  The good news from my standpoint is that he is making urine and creatinine is coming down Objective Vital signs in last 24 hours: Filed Vitals:   11/27/12 0600 11/27/12 0630 11/27/12 0700 11/27/12 0730  BP: 85/57 94/69 99/63  113/64  Pulse: 67 116 112 48  Temp:      TempSrc:      Resp: 12 18 15 11   Height:      Weight:      SpO2: 100% 100% 100% 100%   Weight change: -0.4 kg (-14.1 oz)  Intake/Output Summary (Last 24 hours) at 11/27/12 1610 Last data filed at 11/27/12 0700  Gross per 24 hour  Intake 2932.16 ml  Output   1475 ml  Net 1457.16 ml    Assessment/ Plan: Pt is a 62 y.o. yo male who was admitted on 11/23/2012 with  A on CRF in the setting of biventricular failure but also diarrhea and initially felt to be dry (hypotensive and tachycardic)  Assessment/Plan: 1. A on CKD- with 2 previous episodes of AKI- creatinine was around 1.4 on 10/13/12.  He is making better urine although ins still greater than outs.  CVP is being read as low but is it affected by his cardiac situation ?  Fortunately renal parameters are improving because I am not sure what his cardiac prognosis is.  Cards involved, on amio/milrinone and now norepi- cardioversion was planned for Monday.  I think his renal issues are all due to his heart and also had an enterobacter UTI on zosyn.   2. Initial hyperkalemic, now hypokalemic, will increase his repletion 3. Anemia- hgb stable, has not required ESA 4. Thrombocytopenia- stable- HIT panel pending 5. HTN/volume- really not sure where his volume status is.  I had stopped extra IVF yest because had been 7 liters positive this admit.  Is still a little more in than out, not sure CVP is accurate.   [address] A    Labs: Basic Metabolic Panel:  Recent Labs Lab 11/24/12 0530  11/26/12 0418 11/26/12 1400 11/27/12 0600  NA 142  < >  138 137 140  K 5.0  < > 3.1* 3.4* 3.0*  CL 101  < > 103 104 107  CO2 21  < > 22 22 22   GLUCOSE 77  < > 102* 111* 117*  BUN 133*  < > 101* 93* 79*  CREATININE 5.02*  < > 3.21* 2.91* 2.42*  CALCIUM 8.6  < > 7.7* 7.7* 8.0*  PHOS 4.4  --  2.8  --   --   < > = values in this interval not displayed. Liver Function Tests:  Recent Labs Lab 11/23/12 2105 11/24/12 0530 11/26/12 0418 11/27/12 0600  AST 39* 46*  --  34  ALT 26 28  --  30  ALKPHOS 101 95  --  119*  BILITOT 0.6 0.6  --  0.5  PROT 8.5* 7.9  --  6.1  ALBUMIN 2.6* 2.6* 1.9* 1.8*   No results found for this basename: LIPASE, AMYLASE,  in the last 168 hours No results found for this basename: AMMONIA,  in the last 168 hours CBC:  Recent Labs Lab 11/23/12 2105  11/24/12 0530 11/25/12 0624 11/26/12 0418 11/27/12 0600  WBC 6.5  --  5.3 5.1 4.4 6.3  NEUTROABS 2.5  --  1.9  --   --   --  HGB 14.3  < > 13.9 11.8* 11.1* 11.0*  HCT 43.0  < > 41.1 35.0* 33.1* 32.6*  MCV 76.5*  --  76.5* 75.6* 75.7* 74.9*  PLT 116*  --  123* 95* 78* 78*  < > = values in this interval not displayed. Cardiac Enzymes: No results found for this basename: CKTOTAL, CKMB, CKMBINDEX, TROPONINI,  in the last 168 hours CBG:  Recent Labs Lab 11/26/12 0425 11/26/12 0731 11/26/12 1156 11/26/12 1753 11/26/12 2143  GLUCAP 92 117* 90 124* 127*    Iron Studies: No results found for this basename: IRON, TIBC, TRANSFERRIN, FERRITIN,  in the last 72 hours Studies/Results: Dg Chest Port 1 View  11/26/2012   CLINICAL DATA:  Central venous catheter placement  EXAM: PORTABLE CHEST - 1 VIEW  COMPARISON:  11/25/2012  FINDINGS: Right jugular catheter tip in the SVC. No pneumothorax  Cardiac enlargement with vascular congestion. Mild bibasilar atelectasis unchanged.  IMPRESSION: Satisfactory central venous catheter placement. Otherwise no change.   Electronically Signed   By: Marlan Palau M.D.   On: 11/26/2012 12:56   Dg Chest Port 1 View  11/25/2012    CLINICAL DATA:  Diabetes, hypertension, COPD, dilated cardiomyopathy ; digoxin toxicity, CHF, question pneumonia  EXAM: PORTABLE CHEST - 1 VIEW  COMPARISON:  Portable exam 1718 hr compared to 10/06/2012  FINDINGS: Enlargement of cardiac silhouette with pulmonary vascular congestion.  Tortuous aorta.  No acute failure or consolidation.  Significantly improved aeration at left base since previous exam.  No pleural effusion or pneumothorax.  Bones unremarkable.  IMPRESSION: Enlargement of cardiac silhouette with pulmonary vascular congestion.  No acute abnormalities.   Electronically Signed   By: Ulyses Southward M.D.   On: 11/25/2012 17:28   Medications: Infusions: . sodium chloride 20 mL/hr at 11/26/12 1057  . sodium chloride    . amiodarone (NEXTERONE PREMIX) 360 mg/200 mL dextrose 30 mg/hr (11/27/12 0500)  . DOPamine 2.5 mcg/kg/min (11/26/12 2000)  . heparin 1,600 Units/hr (11/27/12 0500)  . milrinone 0.125 mcg/kg/min (11/27/12 0500)  . norepinephrine (LEVOPHED) Adult infusion 5 mcg/min (11/27/12 0500)    Scheduled Medications: . famotidine  20 mg Oral Daily  . hydrocortisone sodium succinate  50 mg Intravenous Q6H  . insulin aspart  0-9 Units Subcutaneous TID WC  . midodrine  5 mg Oral TID WC  . piperacillin-tazobactam (ZOSYN)  IV  3.375 g Intravenous Q8H  . potassium chloride  20 mEq Oral Daily  . sodium chloride  3 mL Intravenous Q12H  . sodium chloride  3 mL Intravenous Q12H  . sodium chloride  3 mL Intravenous Q12H    have reviewed scheduled and prn medications.  Physical Exam: General: NAD watching cartoons Heart: irreg and variable Lungs: Mostly cclear Abdomen: soft, non tender Extremities: no peripheral edema    11/27/2012,9:03 AM  LOS: 4 days

## 2012-11-27 NOTE — Progress Notes (Signed)
I discussed with Dr. Molli Knock and at this time patient's main issue more than CHF is RV systolic failure and prerenal shock due to decreased cardiac output from RV. Hence if hypotension persists, we can certainly give him gentle fluid bolus and hence will discontinue Levophed for now unless no option. I also suspect that once infection is controlled, he should do well. Continue low dose dopamine.   Patient has been incontinent and it has been extremely difficult to monitor his urinary output and his CVP is only 2.  Would recommend increasing the IV fluids 125 cc an hour, would recommend also introducing a Foley catheter for strict I's and O's for the next 48 hours monitor his input and output in a straight fashion.  D/W RN

## 2012-11-27 NOTE — Progress Notes (Signed)
PULMONARY  / CRITICAL CARE MEDICINE  Name: Caleb Bauer MRN: [id number] DOB: 25-Jul-1950    ADMISSION DATE:  11/23/2012 CONSULTATION DATE:  11/25/2012  REFERRING MD :  [facility] PRIMARY SERVICE: TRH  CHIEF COMPLAINT:  Hypotension  BRIEF PATIENT DESCRIPTION: 62 year old male with PMH of CRI stage 3 and marginal BP at baseline presenting to the hospital with dig toxicity.  Was admitted to SDU, renal function continued to deteriorate, on 9/25 patient was noted to be hypotensive and PCCM was called on consultation.  Of note, the patient is being treated for UTI.  He also has diarrhea with high doses of kayexalate and C. Diff is negative.  SIGNIFICANT EVENTS / STUDIES:  9/25 digoxin toxicity.  LINES / TUBES: PIV  CULTURES: Blood 9/23>>>NTD Urine 9/23>>>Enterobacter aerogenes sensitive only to bactrim, zosyn and levaquin.  ANTIBIOTICS: Zosyn 9/24>>>  SUBJECTIVE:  Better since BP higher  VITAL SIGNS: Temp:  [97.7 F (36.5 C)-98.9 F (37.2 C)] 98.9 F (37.2 C) (09/27 0400) Pulse Rate:  [32-118] 48 (09/27 0730) Resp:  [11-29] 11 (09/27 0730) BP: (60-141)/(43-100) 113/64 mmHg (09/27 0730) SpO2:  [85 %-100 %] 100 % (09/27 0730) Weight:  [80.2 kg (176 lb 12.9 oz)] 80.2 kg (176 lb 12.9 oz) (09/27 0500) HEMODYNAMICS: Bp 90s, on 2.65mcg DA  On milrinone drip and amiod drip  CVP:  [0 mmHg-8 mmHg] 3 mmHg On RA   INTAKE / OUTPUT: Intake/Output     09/26 0701 - 09/27 0700 09/27 0701 - 09/28 0700   P.O. 720    I.V. (mL/kg) 1833.6 (22.9)    IV Piggyback 700    Total Intake(mL/kg) 3253.6 (40.6)    Urine (mL/kg/hr) 1475 (0.8)    Emesis/NG output 100 (0.1)    Stool     Total Output 1575     Net +1678.6          Urine Occurrence 3 x    Stool Occurrence 2 x      PHYSICAL EXAMINATION: General:  Chronically ill appearing male, NAD. Neuro:  Alert, oriented and interactive, moving all ext to command. HEENT:  [place]/AT, PERRL, EOM-I and MMM. Cardiovascular:  RRR, Nl S1/S2, -M/R/G. Lungs:   CTA bilaterally. Abdomen:  Soft, NT, ND and +BS. Musculoskeletal:  -edema and -tenderness. Skin:  Intact.  LABS:  CBC Recent Labs     11/25/12  0624  11/26/12  0418  11/27/12  0600  WBC  5.1  4.4  6.3  HGB  11.8*  11.1*  11.0*  HCT  35.0*  33.1*  32.6*  PLT  95*  78*  78*   Coag's No results found for this basename: APTT, INR,  in the last 72 hours  BMET Recent Labs     11/26/12  0418  11/26/12  1400  11/27/12  0600  NA  138  137  140  K  3.1*  3.4*  3.0*  CL  103  104  107  CO2  22  22  22   BUN  101*  93*  79*  CREATININE  3.21*  2.91*  2.42*  GLUCOSE  102*  111*  117*   Electrolytes Recent Labs     11/26/12  0418  11/26/12  1400  11/27/12  0600  CALCIUM  7.7*  7.7*  8.0*  MG  1.1*   --    --   PHOS  2.8   --    --    Sepsis Markers No results found for this basename: LACTICACIDVEN,  PROCALCITON, O2SATVEN,  in the last 72 hours  ABG Recent Labs     11/26/12  1641  PHART  7.302*  PCO2ART  44.3  PO2ART  77.0*   Liver Enzymes Recent Labs     11/26/12  0418  11/27/12  0600  AST   --   34  ALT   --   30  ALKPHOS   --   119*  BILITOT   --   0.5  ALBUMIN  1.9*  1.8*   Cardiac Enzymes No results found for this basename: TROPONINI, PROBNP,  in the last 72 hours  Glucose Recent Labs     11/25/12  2145  11/26/12  0425  11/26/12  0731  11/26/12  1156  11/26/12  1753  11/26/12  2143  GLUCAP  127*  92  117*  90  124*  127*    Imaging Dg Chest Port 1 View  11/26/2012   CLINICAL DATA:  Central venous catheter placement  EXAM: PORTABLE CHEST - 1 VIEW  COMPARISON:  11/25/2012  FINDINGS: Right jugular catheter tip in the SVC. No pneumothorax  Cardiac enlargement with vascular congestion. Mild bibasilar atelectasis unchanged.  IMPRESSION: Satisfactory central venous catheter placement. Otherwise no change.   Electronically Signed   By: Marlan Palau M.D.   On: 11/26/2012 12:56   Dg Chest Port 1 View  11/25/2012   CLINICAL DATA:  Diabetes,  hypertension, COPD, dilated cardiomyopathy ; digoxin toxicity, CHF, question pneumonia  EXAM: PORTABLE CHEST - 1 VIEW  COMPARISON:  Portable exam 1718 hr compared to 10/06/2012  FINDINGS: Enlargement of cardiac silhouette with pulmonary vascular congestion.  Tortuous aorta.  No acute failure or consolidation.  Significantly improved aeration at left base since previous exam.  No pleural effusion or pneumothorax.  Bones unremarkable.  IMPRESSION: Enlargement of cardiac silhouette with pulmonary vascular congestion.  No acute abnormalities.   Electronically Signed   By: Ulyses Southward M.D.   On: 11/25/2012 17:28     CXR: 9/25: CM, pulm vasc congestion  ASSESSMENT / PLAN:  PULMONARY A: No active issues, however, watch with low EF, renal failure and the amount of fluid being given.  COPD history. P:   - Titrate O2 for sats. - IS per RT protocol. - Bronchodilators as needed.  CARDIOVASCULAR A: Systolic CHF with low EF.  Cardiogenic shock  P:  - KVO IVF. - Follow CVP. - Levophed and dopamine drip. - Renal dose DA. - Cont amiodarone. - Stress dose steroids as ordered.  RENAL A:  Acute on chronic renal failure. P:   - Renal following, will defer to the renal service.  GASTROINTESTINAL A:  Diarrhea.  Likely related to high dose kayexalate. P:   - No more kayexalate given. - C. Diff negative. - Would monitor and not treat for now.  HEMATOLOGIC A:  No active issues. P:  - CBC as needed.  INFECTIOUS A:  Enterobacter UTI.  Lactic acid normal P:   - Continue zosyn.  ENDOCRINE A:  DM.   P:   - Per primary.  NEUROLOGIC A:  No active issues. P:   - Monitor.  TODAY'S SUMMARY: 62 year old male with PMH of CRI, presenting to PCCM for hypotension.  Stress dose steroids started and hopefully will be able to get off pressors today.  I have personally obtained a history, examined the patient, evaluated laboratory and imaging results, formulated the assessment and plan and placed  orders.  CC time 35 min.  Santi Troung G.  Nelda Marseille, M.D. [facility] Pulmonary/Critical Care Medicine. Pager: [phone]. After hours pager: [phone].

## 2012-11-27 NOTE — Progress Notes (Signed)
ANTICOAGULATION CONSULT NOTE - Follow Up Consult  Pharmacy Consult for heparin Indication: atrial flutter  Labs:  Recent Labs  11/25/12 0624  11/26/12 0418 11/26/12 1400 11/26/12 1900 11/27/12 0600 11/27/12 0800  HGB 11.8*  --  11.1*  --   --  11.0*  --   HCT 35.0*  --  33.1*  --   --  32.6*  --   PLT 95*  --  78*  --   --  78*  --   HEPARINUNFRC  --   < > 0.16*  --  <0.10* 0.12* 0.55  CREATININE 4.45*  --  3.21* 2.91*  --  2.42*  --   < > = values in this interval not displayed.  Assessment: 62yo male on heparin per pharmacy for aflutter ordered by CCM service.  HL is finally therapeutic on 1600 units/hr. CBC stable. per RN overnight there was oozing around gums.  Goal of Therapy:  Heparin level 0.3-0.7 units/ml   Plan:  1. Continue heparin drip at 1600 units/hr 2. Daily HL and CBC 3. ? What is plan for long-term anticoagulation? 4. I changed zosyn to 3.375 gm IV q8h, infuse each dose of 4 hrs due to improved renal fxn. Herby Abraham, Pharm.D. [phone] 11/27/2012 12:03 PM

## 2012-11-28 LAB — BASIC METABOLIC PANEL
Creatinine, Ser: 1.69 mg/dL — ABNORMAL HIGH (ref 0.50–1.35)
GFR calc Af Amer: 48 mL/min — ABNORMAL LOW (ref >90)
GFR calc non Af Amer: 42 mL/min — ABNORMAL LOW (ref >90)
Glucose, Bld: 174 mg/dL — ABNORMAL HIGH (ref 70–99)
Potassium: 3.5 mEq/L (ref 3.5–5.1)
Sodium: 143 mEq/L (ref 135–145)

## 2012-11-28 LAB — CBC
HCT: 30.9 % — ABNORMAL LOW (ref 39.0–52.0)
Hemoglobin: 10.3 g/dL — ABNORMAL LOW (ref 13.0–17.0)
MCH: 25.1 pg — ABNORMAL LOW (ref 26.0–34.0)
MCHC: 33.3 g/dL (ref 30.0–36.0)
Platelets: 76 10*3/uL — ABNORMAL LOW (ref 150–400)
RDW: 18.6 % — ABNORMAL HIGH (ref 11.5–15.5)
WBC: 5.2 10*3/uL (ref 4.0–10.5)

## 2012-11-28 LAB — PHOSPHORUS: Phosphorus: 2.2 mg/dL — ABNORMAL LOW (ref 2.3–4.6)

## 2012-11-28 LAB — MAGNESIUM: Magnesium: 2 mg/dL (ref 1.5–2.5)

## 2012-11-28 LAB — HEPARIN LEVEL (UNFRACTIONATED): Heparin Unfractionated: 0.6 IU/mL (ref 0.30–0.70)

## 2012-11-28 MED ORDER — WHITE PETROLATUM GEL
Status: AC
  Administered 2012-11-28: 0.2
  Filled 2012-11-28: qty 5

## 2012-11-28 MED ORDER — SODIUM PHOSPHATE 3 MMOLE/ML IV SOLN
20.0000 mmol | Freq: Once | INTRAVENOUS | Status: AC
  Administered 2012-11-28: 20 mmol via INTRAVENOUS
  Filled 2012-11-28: qty 6.67

## 2012-11-28 MED ORDER — AMIODARONE HCL 200 MG PO TABS
200.0000 mg | ORAL_TABLET | Freq: Two times a day (BID) | ORAL | Status: DC
  Administered 2012-11-28 – 2012-11-29 (×3): 200 mg via ORAL
  Filled 2012-11-28 (×4): qty 1

## 2012-11-28 MED ORDER — POTASSIUM CHLORIDE CRYS ER 20 MEQ PO TBCR
40.0000 meq | EXTENDED_RELEASE_TABLET | Freq: Two times a day (BID) | ORAL | Status: AC
  Administered 2012-11-28 (×2): 40 meq via ORAL

## 2012-11-28 MED ORDER — SODIUM CHLORIDE 0.9 % IV SOLN: INTRAVENOUS | Status: DC

## 2012-11-28 NOTE — Progress Notes (Signed)
PULMONARY  / CRITICAL CARE MEDICINE  Name: Caleb Bauer MRN: [id number] DOB: 1950-10-14    ADMISSION DATE:  11/23/2012 CONSULTATION DATE:  11/25/2012  REFERRING MD :  [facility] PRIMARY SERVICE: TRH  CHIEF COMPLAINT:  Hypotension  BRIEF PATIENT DESCRIPTION: 62 year old male with PMH of CRI stage 3 and marginal BP at baseline presenting to the hospital with dig toxicity.  Was admitted to SDU, renal function continued to deteriorate, on 9/25 patient was noted to be hypotensive and PCCM was called on consultation.  Of note, the patient is being treated for UTI.  He also has diarrhea with high doses of kayexalate and C. Diff is negative.  SIGNIFICANT EVENTS / STUDIES:  9/25 digoxin toxicity.  LINES / TUBES: PIV  CULTURES: Blood 9/23>>>NTD Urine 9/23>>>Enterobacter aerogenes sensitive only to bactrim, zosyn and levaquin.  ANTIBIOTICS: Zosyn 9/24>>>  SUBJECTIVE:  Better since BP higher  VITAL SIGNS: Temp:  [96.7 F (35.9 C)-98.5 F (36.9 C)] 97.5 F (36.4 C) (09/28 0800) Pulse Rate:  [35-118] 58 (09/28 1043) Resp:  [11-23] 18 (09/28 1043) BP: (89-144)/(39-92) 129/92 mmHg (09/28 1000) SpO2:  [98 %-100 %] 100 % (09/28 1043) Weight:  [82.4 kg (181 lb 10.5 oz)] 82.4 kg (181 lb 10.5 oz) (09/28 0500) HEMODYNAMICS: Bp 90s, on 2.42mcg DA  On milrinone drip and amiod drip  CVP:  [2 mmHg-3 mmHg] 3 mmHg On RA   INTAKE / OUTPUT: Intake/Output     09/27 0701 - 09/28 0700 09/28 0701 - 09/29 0700   P.O. 480 120   I.V. (mL/kg) 2350 (28.5) 396.8 (4.8)   IV Piggyback 400    Total Intake(mL/kg) 3230 (39.2) 516.8 (6.3)   Urine (mL/kg/hr) 2425 (1.2) 350 (1)   Emesis/NG output     Total Output 2425 350   Net +805 +166.8        Urine Occurrence 1 x    Stool Occurrence 6 x    Emesis Occurrence       PHYSICAL EXAMINATION: General:  Chronically ill appearing male, NAD. Neuro:  Alert, oriented and interactive, moving all ext to command. HEENT:  [place]/AT, PERRL, EOM-I and MMM. Cardiovascular:   RRR, Nl S1/S2, -M/R/G. Lungs:  CTA bilaterally. Abdomen:  Soft, NT, ND and +BS. Musculoskeletal:  -edema and -tenderness. Skin:  Intact.  LABS:  CBC Recent Labs     11/26/12  0418  11/27/12  0600  11/28/12  0408  WBC  4.4  6.3  5.2  HGB  11.1*  11.0*  10.3*  HCT  33.1*  32.6*  30.9*  PLT  78*  78*  76*   Coag's No results found for this basename: APTT, INR,  in the last 72 hours  BMET Recent Labs     11/26/12  1400  11/27/12  0600  11/28/12  0408  NA  137  140  143  K  3.4*  3.0*  3.5  CL  104  107  110  CO2  22  22  21   BUN  93*  79*  56*  CREATININE  2.91*  2.42*  1.69*  GLUCOSE  111*  117*  174*   Electrolytes Recent Labs     11/26/12  0418  11/26/12  1400  11/27/12  0600  11/28/12  0408  CALCIUM  7.7*  7.7*  8.0*  7.9*  MG  1.1*   --    --   2.0  PHOS  2.8   --    --   2.2*  Sepsis Markers No results found for this basename: LACTICACIDVEN, PROCALCITON, O2SATVEN,  in the last 72 hours  ABG Recent Labs     11/26/12  1641  PHART  7.302*  PCO2ART  44.3  PO2ART  77.0*   Liver Enzymes Recent Labs     11/26/12  0418  11/27/12  0600  AST   --   34  ALT   --   30  ALKPHOS   --   119*  BILITOT   --   0.5  ALBUMIN  1.9*  1.8*   Cardiac Enzymes No results found for this basename: TROPONINI, PROBNP,  in the last 72 hours  Glucose Recent Labs     11/26/12  2143  11/27/12  0850  11/27/12  1154  11/27/12  1700  11/27/12  2203  11/28/12  0815  GLUCAP  127*  109*  113*  138*  164*  147*    Imaging Dg Chest Port 1 View  11/26/2012   CLINICAL DATA:  Central venous catheter placement  EXAM: PORTABLE CHEST - 1 VIEW  COMPARISON:  11/25/2012  FINDINGS: Right jugular catheter tip in the SVC. No pneumothorax  Cardiac enlargement with vascular congestion. Mild bibasilar atelectasis unchanged.  IMPRESSION: Satisfactory central venous catheter placement. Otherwise no change.   Electronically Signed   By: Marlan Palau M.D.   On: 11/26/2012 12:56      CXR: 9/25: CM, pulm vasc congestion  ASSESSMENT / PLAN:  PULMONARY A: No active issues, however, watch with low EF, renal failure and the amount of fluid being given.  COPD history. P:   - Titrate O2 for sats. - IS per RT protocol. - Bronchodilators as needed.  CARDIOVASCULAR A: Systolic CHF with low EF.  Cardiogenic shock  Cortisol level of 15.4. P:  - KVO IVF. - Follow CVP. - Dopamine and milrinone, will attempt to titrate down as BP allows. - Cont amiodarone. - Continue stress dose steroids as ordered.  RENAL A:  Acute on chronic renal failure. P:   - Renal following, will defer to the renal service.  GASTROINTESTINAL A:  Diarrhea.  Likely related to high dose kayexalate. P:   - No more kayexalate given. - C. Diff negative. - Would monitor and not treat for now.  HEMATOLOGIC A:  No active issues. P:  - CBC as needed.  INFECTIOUS A:  Enterobacter UTI.  Lactic acid normal P:   - Continue zosyn.  ENDOCRINE A:  DM.   P:   - Per primary.  NEUROLOGIC A:  No active issues. P:   - Monitor.  TODAY'S SUMMARY: 62 year old male with PMH of CRI, presenting to PCCM for hypotension.  Stress dose steroids helped BP tremendously, will attempt to titrate down pressors as tolerated and continue abx for UTI, if off pressors will consider transfer to SDU and back to [facility] service in AM.  I have personally obtained a history, examined the patient, evaluated laboratory and imaging results, formulated the assessment and plan and placed orders.  CC time 35 min.  Alyson Reedy, M.D. [facility] Pulmonary/Critical Care Medicine. Pager: [phone]. After hours pager: [phone].

## 2012-11-28 NOTE — Progress Notes (Signed)
Subjective:  Hemodynamics improved, was 800 positive overnight    norepi off but is still on milrinone and amio.  The good news from my standpoint is that he is making urine and creatinine is coming down, really to his baseline Objective Vital signs in last 24 hours: Filed Vitals:   11/28/12 0400 11/28/12 0500 11/28/12 0600 11/28/12 0800  BP: 128/50 131/67 129/64 115/86  Pulse: 37 36 35 108  Temp: 98 F (36.7 C)   97.5 F (36.4 C)  TempSrc: Oral   Oral  Resp: 15 16 12 16   Height:      Weight:  82.4 kg (181 lb 10.5 oz)    SpO2: 100% 100% 100% 100%   Weight change: 2.2 kg (4 lb 13.6 oz)  Intake/Output Summary (Last 24 hours) at 11/28/12 0913 Last data filed at 11/28/12 0900  Gross per 24 hour  Intake 3351.63 ml  Output   2325 ml  Net 1026.63 ml    Assessment/ Plan: Pt is a 62 y.o. yo male who was admitted on 11/23/2012 with  A on CRF in the setting of biventricular failure but also diarrhea and initially felt to be dry (hypotensive and tachycardic)  Assessment/Plan: 1. A on CKD- with 2 previous episodes of AKI- creatinine was around 1.4 on 10/13/12.  He is making better urine although ins still greater than outs.  CVP is being read as low but is it affected by his cardiac situation ?  Fortunately renal parameters are improving because I am not sure what his cardiac prognosis is.  Cards involved, on amio/milrinone and  cardioversion planned for Monday.  I think his renal issues are all due to his heart and also had an enterobacter UTI on zosyn.   2. Initial hyperkalemic, now hypokalemic, will give total of 80 today then stop, it can then be repleted PRN 3. Anemia- hgb stable, has not required ESA 4. Thrombocytopenia- stable- HIT panel pending 5. HTN/volume- really not sure where his volume status is.  I had stopped extra IVF yest because had been 7 liters positive this admit.  Is still a little more in than out, not sure CVP is accurate.  6. Hypophosphatemia- will replete today   Pt is  out of the woods as far as his kidneys, renal will sign off and leave things to the cardiologist.  I think if his heart function is able to be maximized hopefully can avoid future episodes of AKI in the future.  Call with questions.   Erlin Gardella A    Labs: Basic Metabolic Panel:  Recent Labs Lab 11/24/12 0530  11/26/12 0418 11/26/12 1400 11/27/12 0600 11/28/12 0408  NA 142  < > 138 137 140 143  K 5.0  < > 3.1* 3.4* 3.0* 3.5  CL 101  < > 103 104 107 110  CO2 21  < > 22 22 22 21   GLUCOSE 77  < > 102* 111* 117* 174*  BUN 133*  < > 101* 93* 79* 56*  CREATININE 5.02*  < > 3.21* 2.91* 2.42* 1.69*  CALCIUM 8.6  < > 7.7* 7.7* 8.0* 7.9*  PHOS 4.4  --  2.8  --   --  2.2*  < > = values in this interval not displayed. Liver Function Tests:  Recent Labs Lab 11/23/12 2105 11/24/12 0530 11/26/12 0418 11/27/12 0600  AST 39* 46*  --  34  ALT 26 28  --  30  ALKPHOS 101 95  --  119*  BILITOT 0.6 0.6  --  0.5  PROT 8.5* 7.9  --  6.1  ALBUMIN 2.6* 2.6* 1.9* 1.8*   No results found for this basename: LIPASE, AMYLASE,  in the last 168 hours No results found for this basename: AMMONIA,  in the last 168 hours CBC:  Recent Labs Lab 11/23/12 2105  11/24/12 0530 11/25/12 0624 11/26/12 0418 11/27/12 0600 11/28/12 0408  WBC 6.5  --  5.3 5.1 4.4 6.3 5.2  NEUTROABS 2.5  --  1.9  --   --   --   --   HGB 14.3  < > 13.9 11.8* 11.1* 11.0* 10.3*  HCT 43.0  < > 41.1 35.0* 33.1* 32.6* 30.9*  MCV 76.5*  --  76.5* 75.6* 75.7* 74.9* 75.2*  PLT 116*  --  123* 95* 78* 78* 76*  < > = values in this interval not displayed. Cardiac Enzymes: No results found for this basename: CKTOTAL, CKMB, CKMBINDEX, TROPONINI,  in the last 168 hours CBG:  Recent Labs Lab 11/27/12 0850 11/27/12 1154 11/27/12 1700 11/27/12 2203 11/28/12 0815  GLUCAP 109* 113* 138* 164* 147*    Iron Studies: No results found for this basename: IRON, TIBC, TRANSFERRIN, FERRITIN,  in the last 72  hours Studies/Results: Dg Chest Port 1 View  11/26/2012   CLINICAL DATA:  Central venous catheter placement  EXAM: PORTABLE CHEST - 1 VIEW  COMPARISON:  11/25/2012  FINDINGS: Right jugular catheter tip in the SVC. No pneumothorax  Cardiac enlargement with vascular congestion. Mild bibasilar atelectasis unchanged.  IMPRESSION: Satisfactory central venous catheter placement. Otherwise no change.   Electronically Signed   By: Marlan Palau M.D.   On: 11/26/2012 12:56   Medications: Infusions: . sodium chloride 10 mL/hr at 11/28/12 0800  . sodium chloride 50 mL/hr at 11/28/12 0800  . amiodarone (NEXTERONE PREMIX) 360 mg/200 mL dextrose 30 mg/hr (11/28/12 0800)  . DOPamine 2.5 mcg/kg/min (11/28/12 0800)  . heparin 1,600 Units/hr (11/28/12 0800)  . milrinone 0.125 mcg/kg/min (11/28/12 0800)    Scheduled Medications: . famotidine  20 mg Oral Daily  . hydrocortisone sodium succinate  50 mg Intravenous Q6H  . insulin aspart  0-9 Units Subcutaneous TID WC  . midodrine  5 mg Oral TID WC  . piperacillin-tazobactam (ZOSYN)  IV  3.375 g Intravenous Q8H  . potassium chloride  40 mEq Oral BID  . sodium chloride  3 mL Intravenous Q12H  . sodium chloride  3 mL Intravenous Q12H    have reviewed scheduled and prn medications.  Physical Exam: General: NAD watching cartoons Heart: irreg and variable Lungs: Mostly cclear Abdomen: soft, non tender Extremities: no peripheral edema    11/28/2012,9:13 AM  LOS: 5 days

## 2012-11-28 NOTE — Progress Notes (Signed)
ANTICOAGULATION CONSULT NOTE - Follow up Consult  Pharmacy Consult for Heparin  Indication: atrial fibrillation  Allergies  Allergen Reactions  . Other Nausea And Vomiting    Patient stated drug is "kerein"    Patient Measurements: Height: 6\' 2"  (188 cm) Weight: 181 lb 10.5 oz (82.4 kg) IBW/kg (Calculated) : 82.2 Heparin Dosing Weight: 77kg  Vital Signs: Temp: 98 F (36.7 C) (09/28 0400) Temp src: Oral (09/28 0400) BP: 129/64 mmHg (09/28 0600) Pulse Rate: 35 (09/28 0600)  Labs:  Recent Labs  11/26/12 0418 11/26/12 1400  11/27/12 0600 11/27/12 0800 11/28/12 0408  HGB 11.1*  --   --  11.0*  --  10.3*  HCT 33.1*  --   --  32.6*  --  30.9*  PLT 78*  --   --  78*  --  76*  HEPARINUNFRC 0.16*  --   < > 0.12* 0.55 0.60  CREATININE 3.21* 2.91*  --  2.42*  --  1.69*  < > = values in this interval not displayed.  Estimated Creatinine Clearance: 52.7 ml/min (by C-G formula based on Cr of 1.69).   Medical History: Past Medical History  Diagnosis Date  . Diabetes mellitus without complication   . CHF (congestive heart failure)   . Hypertension   . Cardiomyopathy, dilated   . Small bowel obstruction 10/2012  . COPD (chronic obstructive pulmonary disease)   . Herpes     BUTT AND BACK- 9/23 HEALED     Assessment: 62yom with Hx HF EF 20% admitted with acute renal failure and digoxin toxicity.  Digoxin level improved after digabind 5.1>1.9, K improved 5.8>3.5, renal function improving 5.5>4.5>1.69 ( baseline 1.4 8/14).  He has new onset A-Fib RVr HR 100s this admit. Heparin drip 1600 uts/hr HL 0.6 at goal.  No bleeding noted, s/p some gum bleeding yesterday.  CBC slightly lower than admit but dehydrated on admit improved with IVF- watch.     Goal of Therapy:  Heparin level 0.3-0.7 units/ml Monitor platelets by anticoagulation protocol: Yes   Plan:   Continue heparin drip 1600 uts/hr Daily heparin level, CBC   Leota Sauers Pharm.D. CPP, BCPS Clinical  Pharmacist [phone] 11/28/2012 7:45 AM

## 2012-11-28 NOTE — Progress Notes (Signed)
Subjective:  Feels much better. No chest pain, dyspnea or palpitations. Laying flat in bed without orthopnea. BP has been stable and UOP excellent.  Objective:  Vital Signs in the last 24 hours: Temp:  [96.7 F (35.9 C)-98.5 F (36.9 C)] 97.4 F (36.3 C) (09/28 1200) Pulse Rate:  [35-111] 59 (09/28 1300) Resp:  [12-21] 18 (09/28 1300) BP: (89-168)/(49-92) 123/76 mmHg (09/28 1300) SpO2:  [98 %-100 %] 100 % (09/28 1300) Weight:  [82.4 kg (181 lb 10.5 oz)] 82.4 kg (181 lb 10.5 oz) (09/28 0500)  Intake/Output from previous day: 09/27 0701 - 09/28 0700 In: 3230 [P.O.:480; I.V.:2350; IV Piggyback:400] Out: 2425 [Urine:2425]  Physical Exam:  General appearance: alert, cooperative, appears older than stated age and no distress  Lungs: clear to auscultation bilaterally  Heart: S1 variable , S2 normal, No gallop or murmur. Abdomen: soft, non-tender; bowel sounds normal; no masses, no organomegaly  Extremities: extremities normal, atraumatic, no cyanosis or edema  Neurologic: Grossly normal   Lab Results:  Recent Labs  11/27/12 0600 11/28/12 0408  WBC 6.3 5.2  HGB 11.0* 10.3*  PLT 78* 76*    Recent Labs  11/27/12 0600 11/28/12 0408  NA 140 143  K 3.0* 3.5  CL 107 110  CO2 22 21  GLUCOSE 117* 174*  BUN 79* 56*  CREATININE 2.42* 1.69*    Recent Labs  11/27/12 0600  PROT 6.1  ALBUMIN 1.8*  AST 34  ALT 30  ALKPHOS 119*  BILITOT 0.5    BNP (last 3 results)  Recent Labs  09/25/12 0450 09/26/12 0530 10/06/12 2305  PROBNP 26983.0* 34516.0* 33980.0*      EKG: Atrial flutter with Variable conduction, with a ventricular rate of 100-110 beats a minute. LAD, LAFB, Right branch block, LVH with diffuse ST-T wave changes. Atrial flutter is new compared to the prior EKG.  Echocardiogram 09/13/2012 LVEF 20-25%, right ventricle moderately dilated and hypokinetic.  Assessment/Plan:  1. Acute on chronic systolic and diastolic heart failure, involving both the right  ventricle and left ventricle. Biventricular failure.  Echocardiogram 09/13/2012 LVEF 20-25%, right ventricle moderately dilated and hypokinetic.  2. New onset of atrial flutter with Variable AV conduction. I did administer intravenous adenosine to confirm presence of underlying atrial flutter.  3. Acute kidney injury and acute renal failure, multifactorial etiology, dehydration, UTI and urosepsis along with prerenal failure from both LV and RV failure.  3. COPD and history of prior tobacco use. Patient's presentation is consistent with biventricular failure and chronic cor pulmonale.  4. Chronic thrombcytopenia  5. Anemia hypoproteinemia and failure to thrive  Recommendation: Patient's hemodynamics have improved. Pharmacy is on board. I will transition his amiodarone to by mouth 4 mg twice a day, discontinue Primacor and also dopamine, and saline locked his IV fluids and have patient ambulate.  I will be performing a TEE guided cardioversion on Monday morning to improve his hemodynamics, there was a question about mural thrombus by echocardiogram that was done in July of 2014, patient may be a candidate for a Eliquis once we know the status of his renal function.  I also discussed the end-of-life issues with the patient, patient although only 61 year age, with biventricular failure, renal failure, uncontrolled diabetes, no significant social support, would not be an appropriate candidate for heart transplantation and should be a DO NOT RESUSCITATE. Patient is aware of this.  Pamella Pert, M.D. 11/28/2012, 2:37 PM [facility] Pager: [phone] Office: [phone] If no answer: [phone]

## 2012-11-29 ENCOUNTER — Encounter (HOSPITAL_COMMUNITY): Payer: Self-pay | Admitting: *Deleted

## 2012-11-29 ENCOUNTER — Encounter (HOSPITAL_COMMUNITY)
Admission: EM | Disposition: A | Discharge status: Skilled Nursing Facility | Payer: Self-pay | Source: Home / Self Care | Attending: Critical Care Medicine

## 2012-11-29 LAB — CBC
HCT: 28.1 % — ABNORMAL LOW (ref 39.0–52.0)
MCH: 25.1 pg — ABNORMAL LOW (ref 26.0–34.0)
MCHC: 33.8 g/dL (ref 30.0–36.0)
MCV: 74.1 fL — ABNORMAL LOW (ref 78.0–100.0)
Platelets: 82 10*3/uL — ABNORMAL LOW (ref 150–400)
RBC: 3.79 MIL/uL — ABNORMAL LOW (ref 4.22–5.81)
RDW: 18.5 % — ABNORMAL HIGH (ref 11.5–15.5)

## 2012-11-29 LAB — BASIC METABOLIC PANEL
BUN: 39 mg/dL — ABNORMAL HIGH (ref 6–23)
Calcium: 8.2 mg/dL — ABNORMAL LOW (ref 8.4–10.5)
Creatinine, Ser: 1.34 mg/dL (ref 0.50–1.35)
GFR calc Af Amer: 64 mL/min — ABNORMAL LOW (ref >90)
GFR calc non Af Amer: 55 mL/min — ABNORMAL LOW (ref >90)

## 2012-11-29 LAB — DIGOXIN LEVEL: Digoxin Level: 0.9 ng/mL (ref 0.8–2.0)

## 2012-11-29 LAB — GLUCOSE, CAPILLARY
Glucose-Capillary: 123 mg/dL — ABNORMAL HIGH (ref 70–99)
Glucose-Capillary: 102 mg/dL — ABNORMAL HIGH (ref 70–99)
Glucose-Capillary: 174 mg/dL — ABNORMAL HIGH (ref 70–99)

## 2012-11-29 LAB — MAGNESIUM: Magnesium: 1.6 mg/dL (ref 1.5–2.5)

## 2012-11-29 LAB — HEPARIN INDUCED THROMBOCYTOPENIA PNL
Patient O.D.: 0.47
UFH High Dose UFH H: 1 % Release
UFH Low Dose 0.5 IU/mL: 0 % Release
UFH SRA Result: NEGATIVE

## 2012-11-29 SURGERY — CARDIOVERSION
Anesthesia: Monitor Anesthesia Care | Wound class: Clean

## 2012-11-29 MED ORDER — TORSEMIDE 20 MG PO TABS
20.0000 mg | ORAL_TABLET | Freq: Every day | ORAL | Status: DC
  Administered 2012-11-29 – 2012-12-02 (×4): 20 mg via ORAL
  Filled 2012-11-29 (×4): qty 1

## 2012-11-29 MED ORDER — CARVEDILOL 3.125 MG PO TABS
3.1250 mg | ORAL_TABLET | Freq: Two times a day (BID) | ORAL | Status: DC
  Administered 2012-11-29 – 2012-12-02 (×7): 3.125 mg via ORAL
  Filled 2012-11-29 (×9): qty 1

## 2012-11-29 MED ORDER — AMIODARONE HCL 200 MG PO TABS
200.0000 mg | ORAL_TABLET | Freq: Every day | ORAL | Status: DC
  Administered 2012-11-30 – 2012-12-02 (×3): 200 mg via ORAL
  Filled 2012-11-29 (×3): qty 1

## 2012-11-29 MED ORDER — APIXABAN 5 MG PO TABS
5.0000 mg | ORAL_TABLET | Freq: Two times a day (BID) | ORAL | Status: DC
  Administered 2012-11-29 – 2012-12-02 (×7): 5 mg via ORAL
  Filled 2012-11-29 (×8): qty 1

## 2012-11-29 MED ORDER — POTASSIUM CHLORIDE ER 10 MEQ PO TBCR
10.0000 meq | EXTENDED_RELEASE_TABLET | Freq: Every day | ORAL | Status: DC
  Administered 2012-11-29 – 2012-12-02 (×4): 10 meq via ORAL
  Filled 2012-11-29 (×4): qty 1

## 2012-11-29 NOTE — Anesthesia Preprocedure Evaluation (Deleted)
Anesthesia Evaluation    Airway       Dental   Pulmonary COPDformer smoker (quit 7/14),          Cardiovascular hypertension, Pt. on medications and Pt. on home beta blockers +CHF (7/14 ECHO: EF 20-25%, possible apical thrombus ) + dysrhythmias Atrial Fibrillation and Ventricular Tachycardia     Neuro/Psych    GI/Hepatic negative GI ROS, Intermittently elevated LFTs   Endo/Other  diabetes (glu 123)  Renal/GU ARF and Renal InsufficiencyRenal disease (creat 1.34)     Musculoskeletal   Abdominal   Peds  Hematology  (+) Blood dyscrasia (Hb 9.5, plts 82k), anemia ,   Anesthesia Other Findings   Reproductive/Obstetrics                           Anesthesia Physical Anesthesia Plan Anesthesia Quick Evaluation

## 2012-11-29 NOTE — Progress Notes (Signed)
Co-signed Lorenz Coaster RN/BSN for medications administration and Vital signs. Ernesta Amble, RN/BSN

## 2012-11-29 NOTE — Progress Notes (Signed)
Report given to receiving RN. Patient is stable. No signs or symptoms of distress or discomfort.  

## 2012-11-29 NOTE — Progress Notes (Signed)
ANTICOAGULATION CONSULT NOTE - Follow up Consult  Pharmacy Consult for Heparin  Indication: atrial fibrillation  Allergies  Allergen Reactions  . Other Nausea And Vomiting    Patient stated drug is "kerein"    Patient Measurements: Height: 6\' 2"  (188 cm) Weight: 182 lb 12.2 oz (82.9 kg) IBW/kg (Calculated) : 82.2 Heparin Dosing Weight: 77kg  Vital Signs: Temp: 97.4 F (36.3 C) (09/29 0800) Temp src: Oral (09/29 0800) BP: 134/99 mmHg (09/29 0900) Pulse Rate: 79 (09/29 0900)  Labs:  Recent Labs  11/27/12 0600 11/27/12 0800 11/28/12 0408 11/29/12 0438  HGB 11.0*  --  10.3* 9.5*  HCT 32.6*  --  30.9* 28.1*  PLT 78*  --  76* 82*  HEPARINUNFRC 0.12* 0.55 0.60 0.69  CREATININE 2.42*  --  1.69* 1.34    Estimated Creatinine Clearance: 66.5 ml/min (by C-G formula based on Cr of 1.34).   Medical History: Past Medical History  Diagnosis Date  . Diabetes mellitus without complication   . CHF (congestive heart failure)   . Hypertension   . Cardiomyopathy, dilated   . Small bowel obstruction 10/2012  . COPD (chronic obstructive pulmonary disease)   . Herpes     BUTT AND BACK- 9/23 HEALED     Assessment: 62yom with Hx HF EF 20% admitted with acute renal failure and digoxin toxicity.  Digoxin level improved after digabind 5.1>1.9>0.9, K improved 5.8>3.3 - replacing, renal function improving 5.5>4.5>1.69>1.3 ( baseline 1.4 8/14).   He has new onset A-Fib RVr HR 100s this admit - converted on his own SR 70s on amiodarone. Heparin drip 1600 uts/hr HL 0.69 at upper end of goal.  No bleeding noted, s/p some gum bleeding 9/27.  CBC slightly lower than admit but dehydrated on admit improved with IVF- watch.     Goal of Therapy:  Heparin level 0.3-0.7 units/ml Monitor platelets by anticoagulation protocol: Yes   Plan:   Decrease  heparin drip 1550 uts/hr Daily heparin level, CBC   Leota Sauers Pharm.D. CPP, BCPS Clinical Pharmacist [phone] 11/29/2012 11:58 AM

## 2012-11-29 NOTE — Progress Notes (Signed)
PULMONARY  / CRITICAL CARE MEDICINE  Name: Caleb Bauer MRN: [id number] DOB: [birth year]    ADMISSION DATE:  11/23/2012 CONSULTATION DATE:  11/25/2012  REFERRING MD :  [facility] PRIMARY SERVICE: [facility]  CHIEF COMPLAINT:  Hypotension  BRIEF PATIENT DESCRIPTION: 62 year old male with PMH of CRI stage 3 and marginal BP at baseline presenting to the hospital with dig toxicity.  Was admitted to SDU, renal function continued to deteriorate, on 9/25 patient was noted to be hypotensive and PCCM was called on consultation.  Of note, the patient is being treated for UTI.  He also has diarrhea with high doses of kayexalate and C. Diff is negative.  SIGNIFICANT EVENTS / STUDIES:  9/25 digoxin toxicity.  LINES / TUBES: PIV  CULTURES: Blood 9/23>>>NTD Urine 9/23>>>Enterobacter aerogenes sensitive only to bactrim, zosyn and levaquin. C diff 9/24 >> negative  ANTIBIOTICS: Zosyn 9/24>>>  SUBJECTIVE:  Feels better, denies pain or dyspnea today  VITAL SIGNS: Temp:  [97.4 F (36.3 C)-98.1 F (36.7 C)] 97.4 F (36.3 C) (09/29 0800) Pulse Rate:  [31-106] 79 (09/29 0900) Resp:  [12-23] 17 (09/29 0900) BP: (106-134)/(71-99) 134/99 mmHg (09/29 0900) SpO2:  [99 %-100 %] 100 % (09/29 0900) Weight:  [82.9 kg (182 lb 12.2 oz)] 82.9 kg (182 lb 12.2 oz) (09/29 0600) HEMODYNAMICS:   CVP:  [3 mmHg-6 mmHg] 3 mmHg On RA   INTAKE / OUTPUT: Intake/Output     09/28 0701 - 09/29 0700 09/29 0701 - 09/30 0700   P.O. 960    I.V. (mL/kg) 906.7 (10.9) 52 (0.6)   IV Piggyback 394.2 12.5   Total Intake(mL/kg) 2260.9 (27.3) 64.5 (0.8)   Urine (mL/kg/hr) 2225 (1.1) 675 (1.8)   Total Output 2225 675   Net +35.9 -610.5          PHYSICAL EXAMINATION: General:  Chronically ill appearing male, NAD. Neuro:  Alert, oriented and interactive, moving all ext to command. HEENT:  [place]/AT, PERRL, EOM-I and MMM. Cardiovascular:  RRR, Nl S1/S2, -M/R/G. Lungs:  CTA bilaterally. Abdomen:  Soft, NT, ND and  +BS. Musculoskeletal:  -edema and -tenderness. Skin:  Intact.  LABS:  CBC Recent Labs     11/27/12  0600  11/28/12  0408  11/29/12  0438  WBC  6.3  5.2  5.8  HGB  11.0*  10.3*  9.5*  HCT  32.6*  30.9*  28.1*  PLT  78*  76*  82*   Coag's No results found for this basename: APTT, INR,  in the last 72 hours  BMET Recent Labs     11/27/12  0600  11/28/12  0408  11/29/12  0438  NA  140  143  143  K  3.0*  3.5  3.3*  CL  107  110  110  CO2  22  21  22   BUN  79*  56*  39*  CREATININE  2.42*  1.69*  1.34  GLUCOSE  117*  174*  119*   Electrolytes Recent Labs     11/27/12  0600  11/28/12  0408  11/29/12  0438  CALCIUM  8.0*  7.9*  8.2*  MG   --   2.0  1.6  PHOS   --   2.2*  2.5   Sepsis Markers No results found for this basename: LACTICACIDVEN, PROCALCITON, O2SATVEN,  in the last 72 hours  ABG Recent Labs     11/26/12  1641  PHART  7.302*  PCO2ART  44.3  PO2ART  77.0*  Liver Enzymes Recent Labs     11/27/12  0600  AST  34  ALT  30  ALKPHOS  119*  BILITOT  0.5  ALBUMIN  1.8*   Cardiac Enzymes No results found for this basename: TROPONINI, PROBNP,  in the last 72 hours  Glucose Recent Labs     11/27/12  2203  11/28/12  0815  11/28/12  1656  11/28/12  2154  11/29/12  0456  11/29/12  0748  GLUCAP  164*  147*  159*  119*  123*  102*    Imaging No results found.   CXR: 9/25: CM, pulm vasc congestion  ASSESSMENT / PLAN:  PULMONARY A: No active issues, however, watch with low EF, renal failure and the amount of fluid being given.  COPD history. P:   - Titrate O2 for sats. - IS per RT protocol. - Bronchodilators as needed.  CARDIOVASCULAR A: Systolic CHF.  Cardiogenic shock, resolved Cortisol level of 15.4. P:  - KVO IVF. - Dopamine and milrinone, weaned to off - Cont amiodarone. - heparin gtt  RENAL A:  Acute on chronic renal failure. Improving P:   - Renal following, will defer to the renal service.  GASTROINTESTINAL A:   Diarrhea.  Likely related to high dose kayexalate. P:   - No more kayexalate given. - C. Diff negative. - Would monitor and not treat for now.  HEMATOLOGIC A:  No active issues. P:  - CBC as needed.  INFECTIOUS A:  Enterobacter UTI.  Lactic acid normal P:   - Continue zosyn. (day 5 of 7 on 9/29)  ENDOCRINE A:  DM.   P:   - Per primary.  NEUROLOGIC A:  No active issues. P:   - Monitor.  TODAY'S SUMMARY: 62 year old male with PMH of CRI, presenting to PCCM for hypotension.  Tx to tele and back to [facility] service as of 9/30.  I have personally obtained a history, examined the patient, evaluated laboratory and imaging results, formulated the assessment and plan and placed orders.  Levy Pupa, MD, PhD 11/29/2012, 11:51 AM [facility] [phone] or if no answer [phone]

## 2012-11-29 NOTE — Progress Notes (Signed)
Subjective:  Feels much better. No chest pain, dyspnea or palpitations. Laying flat in bed without orthopnea. BP has been stable and UOP excellent. Patient has reverted back to sinus rhythm with first degree AV block. Feels well and essentially asymptomatic.  Objective:  Vital Signs in the last 24 hours: Temp:  [97.4 F (36.3 C)-98.1 F (36.7 C)] 97.5 F (36.4 C) (09/29 0400) Pulse Rate:  [31-111] 66 (09/29 0700) Resp:  [12-23] 16 (09/29 0700) BP: (106-168)/(68-94) 123/88 mmHg (09/29 0700) SpO2:  [99 %-100 %] 100 % (09/29 0700) Weight:  [82.9 kg (182 lb 12.2 oz)] 82.9 kg (182 lb 12.2 oz) (09/29 0600)  Intake/Output from previous day: 09/28 0701 - 09/29 0700 In: 2232.4 [P.O.:960; I.V.:890.7; IV Piggyback:381.7] Out: 2225 [Urine:2225]  Physical Exam:  General appearance: alert, cooperative, appears older than stated age and no distress  Lungs: clear to auscultation bilaterally  Heart: S1,S2 normal, No gallop or murmur. Abdomen: soft, non-tender; bowel sounds normal; no masses, no organomegaly  Extremities: extremities normal, atraumatic, no cyanosis or edema  Neurologic: Grossly normal   Lab Results:  Recent Labs  11/28/12 0408 11/29/12 0438  WBC 5.2 5.8  HGB 10.3* 9.5*  PLT 76* 82*    Recent Labs  11/28/12 0408 11/29/12 0438  NA 143 143  K 3.5 3.3*  CL 110 110  CO2 21 22  GLUCOSE 174* 119*  BUN 56* 39*  CREATININE 1.69* 1.34    Recent Labs  11/27/12 0600  PROT 6.1  ALBUMIN 1.8*  AST 34  ALT 30  ALKPHOS 119*  BILITOT 0.5    BNP (last 3 results)  Recent Labs  09/25/12 0450 09/26/12 0530 10/06/12 2305  PROBNP 26983.0* 34516.0* 33980.0*      EKG: Atrial flutter with Variable conduction, with a ventricular rate of 100-110 beats a minute. LAD, LAFB, Right branch block, LVH with diffuse ST-T wave changes. Atrial flutter is new compared to the prior EKG.  Telemetry 11/29/2012: Sinus rhythm with first-degree AV block.  Echocardiogram 09/13/2012  LVEF 20-25%, right ventricle moderately dilated and hypokinetic.  Assessment/Plan:  1. Acute on chronic systolic and diastolic heart failure, involving both the right ventricle and left ventricle. Biventricular failure.  Echocardiogram 09/13/2012 LVEF 20-25%, right ventricle moderately dilated and hypokinetic.  2. New onset of atrial flutter with Variable AV conduction. Patient is now back in sinus rhythm.  3. Acute kidney injury and acute renal failure, multifactorial etiology, dehydration, UTI and urosepsis along with prerenal failure from both LV and RV failure. Acute renal failure has resolved. 3. COPD and history of prior tobacco use. Patient's presentation is consistent with biventricular failure and chronic cor pulmonale.  4. Chronic thrombcytopenia, stable.  5. Anemia hypoproteinemia and failure to thrive.  Recommendation:  Patient has remained stable, hemodynamics are improved, I will cancel his TEE and also cardioversion, I will try to start him back on carvedilol 3.125 mg twice a day, eventually we'll transition him to Bidil that he was on previously. Patient not a candidate for ACE inhibitors or ARB due to acute renal failure and chronic renal insufficiency.  Increase physical activity as tolerated. Continue him on amiodarone 200 mg by mouth daily. We will transition him to Eliquis 5 mg by mouth twice a day, we will have an opportunity to continue to follow his BNP was in the inpatient setting. No obvious bleeding issues, we will discontinue aspirin products upon discharge.  Pamella Pert, M.D. 11/29/2012, 8:43 AM [facility] Pager: [phone] Office: [phone] If no answer: [phone]

## 2012-11-29 NOTE — Progress Notes (Signed)
ANTICOAGULATION CONSULT NOTE - Follow up Consult  Pharmacy Consult for transition from heparin to apixaban Indication: atrial flutter  Allergies  Allergen Reactions  . Other Nausea And Vomiting    Patient stated drug is "kerein"    Patient Measurements: Height: 6\' 1"  (185.4 cm) Weight: 184 lb 4.9 oz (83.6 kg) (bed scale) IBW/kg (Calculated) : 79.9 Heparin Dosing Weight: 77kg  Vital Signs: Temp: 97.5 F (36.4 C) (09/29 1448) Temp src: Oral (09/29 1448) BP: 109/73 mmHg (09/29 1448) Pulse Rate: 72 (09/29 1448)  Labs:  Recent Labs  11/27/12 0600 11/27/12 0800 11/28/12 0408 11/29/12 0438  HGB 11.0*  --  10.3* 9.5*  HCT 32.6*  --  30.9* 28.1*  PLT 78*  --  76* 82*  HEPARINUNFRC 0.12* 0.55 0.60 0.69  CREATININE 2.42*  --  1.69* 1.34    Estimated Creatinine Clearance: 64.6 ml/min (by C-G formula based on Cr of 1.34).   Assessment: 62yom with Hx HF EF 20% admitted with acute renal failure and digoxin toxicity.  Digoxin level improved after digabind 5.1>1.9>0.9, K improved 5.8>3.3 - replacing, renal function improving 5.5>4.5>1.69>1.3 ( baseline 1.4 8/14).   He has new onset A-Flutter RVr HR 100s this admit - converted on his own SR 70s on amiodarone.  Now to convert UFH to apixaban for atrial flutter.   No bleeding noted, s/p some gum bleeding 9/27.  CBC slightly lower than admit but dehydrated on admit improved with IVF- watch.  MD plans to DC aspirin products at discharge.   Goal of Therapy: proper dosing for renal function, age and weight  Plan: 1. Continue heparin drip until first dose of apixaban given 2. apixaban 5 mg po BID 3. Plan d/w RN. 4. F/u renal fxn, CBC. Herby Abraham, Pharm.D. [phone] 11/29/2012 3:23 PM

## 2012-11-30 DIAGNOSIS — R5381 Other malaise: Secondary | ICD-10-CM | POA: Diagnosis present

## 2012-11-30 DIAGNOSIS — E860 Dehydration: Secondary | ICD-10-CM

## 2012-11-30 LAB — CLOSTRIDIUM DIFFICILE BY PCR: Toxigenic C. Difficile by PCR: NEGATIVE

## 2012-11-30 LAB — CULTURE, BLOOD (ROUTINE X 2): Culture: NO GROWTH

## 2012-11-30 LAB — GLUCOSE, CAPILLARY
Glucose-Capillary: 97 mg/dL (ref 70–99)
Glucose-Capillary: 83 mg/dL (ref 70–99)
Glucose-Capillary: 89 mg/dL (ref 70–99)
Glucose-Capillary: 149 mg/dL — ABNORMAL HIGH (ref 70–99)

## 2012-11-30 MED ORDER — SODIUM CHLORIDE 0.9 % IV BOLUS (SEPSIS)
250.0000 mL | Freq: Once | INTRAVENOUS | Status: AC
  Administered 2012-11-30: 250 mL via INTRAVENOUS

## 2012-11-30 MED ORDER — SODIUM CHLORIDE 0.9 % IJ SOLN
10.0000 mL | Freq: Two times a day (BID) | INTRAMUSCULAR | Status: DC
  Administered 2012-11-30: 10 mL

## 2012-11-30 MED ORDER — SODIUM CHLORIDE 0.9 % IJ SOLN
10.0000 mL | INTRAMUSCULAR | Status: DC | PRN
  Administered 2012-11-30 – 2012-12-02 (×6): 10 mL

## 2012-11-30 MED ORDER — LOPERAMIDE HCL 2 MG PO CAPS
2.0000 mg | ORAL_CAPSULE | ORAL | Status: DC | PRN
  Administered 2012-11-30 – 2012-12-01 (×2): 2 mg via ORAL
  Filled 2012-11-30 (×3): qty 1

## 2012-11-30 NOTE — Progress Notes (Signed)
BP is 78/56 and HR is 70. Patient is asymptomatic, with no complaints. No signs or symptoms of distress or discomfort. MD aware and new orders given. Will continue to monitor patient for further changes in condition.

## 2012-11-30 NOTE — Progress Notes (Signed)
Pt a/o, no c/o pain, pts mepilex on buttocks changed, pt on IV abx, will continue to monitor

## 2012-11-30 NOTE — Progress Notes (Signed)
[facility]                                                                                Patient Demographics  Caleb Bauer, is a 62 y.o. male, DOB - 08-May-1950, HYQ:[id number]  Admit date - 11/23/2012   Admitting Physician Eduard Clos, MD  Outpatient Primary MD for the patient is REED, TIFFANY, DO  LOS - 7  Interim History: 62 year-old male with PCKD stage III was admitted with digitalis toxicity. He was admitted to the step down unit however his renal function continued to deteriorate and was then transferred to ICU. Patient was treated for cardiogenic shock. He is currently stable. Currently being treated for urinary tract infection. He has had numerous diarrheal episodes however C. difficile was negative. Repeat C. difficile currently pending.  Chief Complaint  Patient presents with  . Abnormal Lab Results         Assessment & Plan    Chronic systolic congestive heart failure, NYHA class 4  Continue Demadex, torsemide, strict I.'s and O.'s, daily weights, fluid restricted diet.  Cardiology following.   Echo July 2014: EF 20-25%.  RV dilation and hypokinetic.      Cardiology would like to start Bidil if diarrhea subsides.  Patient is not a candidate for for ACEI/ARB due to renal failure  Hypertension  Continue Coreg  New onset A. Flutter with variable AV conduction  Continue Amiodarone.  Currently in sinus rhythm.    Diabetes mellitus without complication  Continue insulin sliding scale with Accu-Cheks.    Acute on CKD (chronic kidney disease) stage 3, GFR 30-59 ml/min  Resolved.  Cr currently 1.34. Continue to monitor her creatinine.      Diarrhea  Currently Cdiff negative, however many episodes of diarrhea.  Repeat Cdiff was ordered.  May consider imiodium or cholestyramine.     UTI (lower urinary tract infection), Enterbacter  Continue Zosyn (day 6/7)       Deconditioning  PT and OT consulted for eval and treatment.     Cardiogenic shock,  resolved    COPD (chronic obstructive pulmonary disease), currently stable.    Hypotension, resolved    Dehydration, resolved.    Thrombocytopenia, unspecified    Digoxin toxicity  Code Status: Full  Family Communication: None  Disposition Plan: Admitted   Procedures None  Consults  Cardiology  DVT Prophylaxis  Eliquis  Lab Results  Component Value Date   PLT 82* 11/29/2012    Medications  Scheduled Meds: . amiodarone  200 mg Oral Daily  . apixaban  5 mg Oral Q12H  . carvedilol  3.125 mg Oral BID WC  . famotidine  20 mg Oral Daily  . insulin aspart  0-9 Units Subcutaneous TID WC  . piperacillin-tazobactam (ZOSYN)  IV  3.375 g Intravenous Q8H  . potassium chloride  10 mEq Oral Daily  . sodium chloride  10-40 mL Intracatheter Q12H  . sodium chloride  3 mL Intravenous Q12H  . torsemide  20 mg Oral Daily   Continuous Infusions: . sodium chloride 20 mL/hr at 11/29/12 0700   PRN Meds:.acetaminophen, acetaminophen, diphenhydrAMINE, ondansetron (ZOFRAN) IV, ondansetron, pneumococcal 23 valent vaccine, sodium chloride  Antibiotics  Anti-infectives   Start     Dose/Rate Route Frequency Ordered Stop   11/27/12 1200  piperacillin-tazobactam (ZOSYN) IVPB 3.375 g     3.375 g 12.5 mL/hr over 240 Minutes Intravenous 3 times per day 11/27/12 0749     11/26/12 1200  piperacillin-tazobactam (ZOSYN) IVPB 2.25 g  Status:  Discontinued     2.25 g 100 mL/hr over 30 Minutes Intravenous 4 times per day 11/26/12 1101 11/27/12 0749   11/25/12 1500  levofloxacin (LEVAQUIN) IVPB 750 mg  Status:  Discontinued     750 mg 100 mL/hr over 90 Minutes Intravenous Every 48 hours 11/25/12 1428 11/25/12 1453   11/25/12 1500  piperacillin-tazobactam (ZOSYN) IVPB 2.25 g  Status:  Discontinued     2.25 g 100 mL/hr over 30 Minutes Intravenous 3 times per day 11/25/12 1453 11/26/12 1101   11/25/12 1400  piperacillin-tazobactam (ZOSYN) IVPB 2.25 g  Status:  Discontinued     2.25 g 100 mL/hr over 30  Minutes Intravenous 3 times per day 11/25/12 1030 11/25/12 1421   11/24/12 2200  cefTRIAXone (ROCEPHIN) 1 g in dextrose 5 % 50 mL IVPB  Status:  Discontinued     1 g 100 mL/hr over 30 Minutes Intravenous Every 24 hours 11/24/12 0329 11/25/12 0935   11/23/12 2145  cefTRIAXone (ROCEPHIN) 1 g in dextrose 5 % 50 mL IVPB     1 g 100 mL/hr over 30 Minutes Intravenous  Once 11/23/12 2137 11/24/12 0203       Time Spent in minutes   25 minutes   Pratik Dalziel D.O. on 11/30/2012 at 12:17 PM  Between 7am to 7pm - Pager - [phone]  After 7pm go to [email or web address] - password [email or web address]  And look for the night coverage person covering for me after hours  [facility] Office  [phone]    Subjective:   Caleb Bauer seen and examined today. Patient states he feels well today. Has no complaints at this current time. Does continue to have diarrhea. Patient denies dizziness, chest pain, shortness of breath, abdominal pain, N/V/C, new weakness, numbess, tingling.    Objective:   Filed Vitals:   11/29/12 1448 11/29/12 1705 11/29/12 2346 11/30/12 0857  BP: 109/73 118/79 125/81 109/69  Pulse: 72 67 71 72  Temp: 97.5 F (36.4 C)  97.6 F (36.4 C)   TempSrc: Oral  Oral   Resp: 16  18   Height: 6\' 1"  (1.854 m)     Weight: 83.6 kg (184 lb 4.9 oz)     SpO2: 100%  98%     Wt Readings from Last 3 Encounters:  11/29/12 83.6 kg (184 lb 4.9 oz)  11/29/12 83.6 kg (184 lb 4.9 oz)  10/13/12 100.2 kg (220 lb 14.4 oz)     Intake/Output Summary (Last 24 hours) at 11/30/12 1217 Last data filed at 11/30/12 0604  Gross per 24 hour  Intake 1192.5 ml  Output    350 ml  Net  842.5 ml    Exam  General: Well developed, well nourished, NAD  HEENT: NCAT, PERRLA, EOMI, Anicteic Sclera, mucous membranes moist. No pharyngeal erythema or exudates  Neck: Supple, no JVD, no masses,  Cardiovascular: S1 S2 auscultated, no rubs, murmurs or gallops. Regular rate and rhythm.  Respiratory:  Clear to auscultation bilaterally with equal chest rise  Abdomen: Soft, nondistended, non-tender to palpation, + bowel sounds  Extremities: warm dry without cyanosis clubbing or edema  Neuro: AAOx3, cranial nerves grossly intact.   Skin: Without rashes  exudates or nodules  Psych: Normal affect and demeanor with intact judgement and insight    Data Review   Micro Results Recent Results (from the past 240 hour(s))  URINE CULTURE     Status: None   Collection Time    11/23/12  9:04 PM      Result Value Range Status   Specimen Description URINE, RANDOM   Final   Special Requests NONE   Final   Culture  Setup Time     Final   Value: 11/24/2012 01:28     Performed at [facility] Count     Final   Value: >=100,000 COLONIES/ML     Performed at [facility]   Culture     Final   Value: ENTEROBACTER AEROGENES     Performed at [facility]   Report Status 11/25/2012 FINAL   Final   Organism ID, Bacteria ENTEROBACTER AEROGENES   Final  CULTURE, BLOOD (ROUTINE X 2)     Status: None   Collection Time    11/23/12 10:10 PM      Result Value Range Status   Specimen Description BLOOD LEFT HAND   Final   Special Requests BOTTLES DRAWN AEROBIC ONLY   Final   Culture  Setup Time     Final   Value: 11/24/2012 01:01     Performed at [facility]   Culture     Final   Value: NO GROWTH 5 DAYS     Performed at [facility]   Report Status 11/30/2012 FINAL   Final  CULTURE, BLOOD (ROUTINE X 2)     Status: None   Collection Time    11/23/12 10:15 PM      Result Value Range Status   Specimen Description BLOOD RIGHT HAND   Final   Special Requests BOTTLES DRAWN AEROBIC ONLY   Final   Culture  Setup Time     Final   Value: 11/24/2012 01:01     Performed at [facility]   Culture     Final   Value: NO GROWTH 5 DAYS     Performed at [facility]   Report Status 11/30/2012 FINAL   Final  CLOSTRIDIUM DIFFICILE BY PCR      Status: None   Collection Time    11/24/12  3:31 AM      Result Value Range Status   C difficile by pcr NEGATIVE  NEGATIVE Final  CLOSTRIDIUM DIFFICILE BY PCR     Status: None   Collection Time    11/30/12  1:43 AM      Result Value Range Status   C difficile by pcr NEGATIVE  NEGATIVE Final    Radiology Reports US Renal  11/23/2012   CLINICAL DATA:  Renal failure.  EXAM: RENAL/URINARY TRACT ULTRASOUND COMPLETE  COMPARISON:  Renal ultrasound 10/07/2012 and CT abdomen and pelvis 10/08/2012.  FINDINGS: Right Kidney  Length: Measures 10.9 cm. There is some increase in cortical echogenicity. No stone, mass or hydronephrosis.  Left Kidney  Length: Measures 11.1 cm There is some increase in cortical echogenicity. No stone, mass or hydronephrosis.  Bladder: Bilateral ureteral jets are identified. No abnormality is seen.  IMPRESSION: Negative for hydronephrosis or acute abnormality.  Mildly increased cortical echogenicity compatible with medical renal disease.   Electronically Signed   By: Drusilla Kanner M.D.   On: 11/23/2012 23:14   Dg Chest Port 1 View  11/26/2012  CLINICAL DATA:  Central venous catheter placement  EXAM: PORTABLE CHEST - 1 VIEW  COMPARISON:  11/25/2012  FINDINGS: Right jugular catheter tip in the SVC. No pneumothorax  Cardiac enlargement with vascular congestion. Mild bibasilar atelectasis unchanged.  IMPRESSION: Satisfactory central venous catheter placement. Otherwise no change.   Electronically Signed   By: Marlan Palau M.D.   On: 11/26/2012 12:56   Dg Chest Port 1 View  11/25/2012   CLINICAL DATA:  Diabetes, hypertension, COPD, dilated cardiomyopathy ; digoxin toxicity, CHF, question pneumonia  EXAM: PORTABLE CHEST - 1 VIEW  COMPARISON:  Portable exam 1718 hr compared to 10/06/2012  FINDINGS: Enlargement of cardiac silhouette with pulmonary vascular congestion.  Tortuous aorta.  No acute failure or consolidation.  Significantly improved aeration at left base since previous  exam.  No pleural effusion or pneumothorax.  Bones unremarkable.  IMPRESSION: Enlargement of cardiac silhouette with pulmonary vascular congestion.  No acute abnormalities.   Electronically Signed   By: Ulyses Southward M.D.   On: 11/25/2012 17:28    CBC  Recent Labs Lab 11/23/12 2105  11/24/12 0530 11/25/12 0624 11/26/12 0418 11/27/12 0600 11/28/12 0408 11/29/12 0438  WBC 6.5  --  5.3 5.1 4.4 6.3 5.2 5.8  HGB 14.3  < > 13.9 11.8* 11.1* 11.0* 10.3* 9.5*  HCT 43.0  < > 41.1 35.0* 33.1* 32.6* 30.9* 28.1*  PLT 116*  --  123* 95* 78* 78* 76* 82*  MCV 76.5*  --  76.5* 75.6* 75.7* 74.9* 75.2* 74.1*  MCH 25.4*  --  25.9* 25.5* 25.4* 25.3* 25.1* 25.1*  MCHC 33.3  --  33.8 33.7 33.5 33.7 33.3 33.8  RDW 17.3*  --  17.4* 17.5* 17.5* 17.9* 18.6* 18.5*  LYMPHSABS 3.1  --  2.5  --   --   --   --   --   MONOABS 0.5  --  0.6  --   --   --   --   --   EOSABS 0.3  --  0.3  --   --   --   --   --   BASOSABS 0.1  --  0.0  --   --   --   --   --   < > = values in this interval not displayed.  Chemistries   Recent Labs Lab 11/23/12 2105  11/24/12 0530  11/26/12 0418 11/26/12 1400 11/27/12 0600 11/28/12 0408 11/29/12 0438  NA 132*  < > 142  < > 138 137 140 143 143  K 5.8*  < > 5.0  < > 3.1* 3.4* 3.0* 3.5 3.3*  CL 93*  < > 101  < > 103 104 107 110 110  CO2 21  --  21  < > 22 22 22 21 22   GLUCOSE 86  < > 77  < > 102* 111* 117* 174* 119*  BUN 134*  < > 133*  < > 101* 93* 79* 56* 39*  CREATININE 5.07*  < > 5.02*  < > 3.21* 2.91* 2.42* 1.69* 1.34  CALCIUM 8.9  --  8.6  < > 7.7* 7.7* 8.0* 7.9* 8.2*  MG  --   --   --   --  1.1*  --   --  2.0 1.6  AST 39*  --  46*  --   --   --  34  --   --   ALT 26  --  28  --   --   --  30  --   --  ALKPHOS 101  --  95  --   --   --  119*  --   --   BILITOT 0.6  --  0.6  --   --   --  0.5  --   --   < > = values in this interval not  displayed. ------------------------------------------------------------------------------------------------------------------ estimated creatinine clearance is 64.6 ml/min (by C-G formula based on Cr of 1.34). ------------------------------------------------------------------------------------------------------------------ No results found for this basename: HGBA1C,  in the last 72 hours ------------------------------------------------------------------------------------------------------------------ No results found for this basename: CHOL, HDL, LDLCALC, TRIG, CHOLHDL, LDLDIRECT,  in the last 72 hours ------------------------------------------------------------------------------------------------------------------ No results found for this basename: TSH, T4TOTAL, FREET3, T3FREE, THYROIDAB,  in the last 72 hours ------------------------------------------------------------------------------------------------------------------ No results found for this basename: VITAMINB12, FOLATE, FERRITIN, TIBC, IRON, RETICCTPCT,  in the last 72 hours  Coagulation profile No results found for this basename: INR, PROTIME,  in the last 168 hours  No results found for this basename: DDIMER,  in the last 72 hours  Cardiac Enzymes No results found for this basename: CK, CKMB, TROPONINI, MYOGLOBIN,  in the last 168 hours ------------------------------------------------------------------------------------------------------------------ No components found with this basename: POCBNP,

## 2012-11-30 NOTE — Evaluation (Signed)
Physical Therapy Evaluation Patient Details Name: Caleb Bauer MRN: [id number] DOB: 10-22-1950 Today's Date: 11/30/2012 Time: 4098-1191 PT Time Calculation (min): 23 min  PT Assessment / Plan / Recommendation History of Present Illness  Pt admitted with digitalis toxicity. Pt with h/o of multiple medical co-morbidities.  Clinical Impression  Pt with generalized weakness and deconditioning. Pt to benefit from returning to [facility] to achieve safe mod I function for safe transition home with sister.    PT Assessment  Patient needs continued PT services    Follow Up Recommendations  SNF;Supervision/Assistance - 24 hour    Does the patient have the potential to tolerate intense rehabilitation      Barriers to Discharge Decreased caregiver support      Equipment Recommendations  None recommended by PT    Recommendations for Other Services     Frequency Min 3X/week    Precautions / Restrictions Precautions Precautions: Fall Restrictions Weight Bearing Restrictions: No   Pertinent Vitals/Pain Denies pain      Mobility  Bed Mobility Bed Mobility: Supine to Sit;Sitting - Scoot to Edge of Bed Supine to Sit: 4: Min assist;With rails;HOB elevated Sitting - Scoot to [place] of Bed: 4: Min assist;With rail Details for Bed Mobility Assistance: pt with labored effort Transfers Transfers: Sit to Stand;Stand to Sit Sit to Stand: 3: Mod assist;With upper extremity assist;From bed Stand to Sit: 4: Min assist;With upper extremity assist;To chair/3-in-1 Details for Transfer Assistance: pt required increased time, initially unsteady upon stand. v/c's for safe hand placement. Ambulation/Gait Ambulation/Gait Assistance: 4: Min assist Ambulation Distance (Feet): 75 Feet Assistive device: Rolling walker Ambulation/Gait Assistance Details: pt mildly shaky. increased bilat UE WBing, + onset of fatigue/sob Gait Pattern: Step-through pattern;Decreased stride length Gait velocity: wfl     Exercises     PT Diagnosis: Difficulty walking;Generalized weakness  PT Problem List: Decreased strength;Decreased activity tolerance;Decreased balance;Decreased mobility PT Treatment Interventions: DME instruction;Gait training;Stair training;Functional mobility training;Therapeutic activities;Therapeutic exercise     PT Goals(Current goals can be found in the care plan section) Acute Rehab PT Goals Patient Stated Goal: return to [facility] to achieve independence PT Goal Formulation: With patient Time For Goal Achievement: 12/07/12 Potential to Achieve Goals: Good  Visit Information  Last PT Received On: 11/30/12 Assistance Needed: +1 History of Present Illness: Pt admitted with cardiogenic shock. Pt with h/o of multiple medical co-morbidities.       Prior Functioning  Home Living Family/patient expects to be discharged to:: Skilled nursing facility Additional Comments: [facility], plans on returning there upon d/c Prior Function Level of Independence: Needs assistance Gait / Transfers Assistance Needed: use RW ADL's / Homemaking Assistance Needed: assist with bathing/dressing Communication Communication: No difficulties    Cognition  Cognition Arousal/Alertness: Awake/alert Behavior During Therapy: WFL for tasks assessed/performed Overall Cognitive Status: Within Functional Limits for tasks assessed    Extremity/Trunk Assessment Upper Extremity Assessment Upper Extremity Assessment: Generalized weakness Lower Extremity Assessment Lower Extremity Assessment: Generalized weakness Cervical / Trunk Assessment Cervical / Trunk Assessment: Normal   Balance    End of Session PT - End of Session Equipment Utilized During Treatment: Gait belt Activity Tolerance: Patient limited by fatigue Patient left: in chair;with call bell/phone within reach Nurse Communication: Mobility status  GP     Marcene Brawn 11/30/2012, 4:50 PM  Lewis Shock, PT,  DPT Pager #: [phone] Office #: [phone]

## 2012-11-30 NOTE — Progress Notes (Signed)
Co-signed for Latisha Teasley RN/BSN for assessments, IV assessments, Vitals, progress notes, care plans, medication administration, and I's & O's. Tyrhonda Georgiades M, RN/BSN 

## 2012-11-30 NOTE — Progress Notes (Signed)
Report given to receiving RN. Patient's BP is low, made receiving RN aware of low BP and to be aware. Patient is resting and comfortable. No signs and symptoms of distress.             Lorenz Coaster, RN, BSN

## 2012-11-30 NOTE — Progress Notes (Signed)
Pt having loose stools, stool sample sent, awaiting results

## 2012-11-30 NOTE — Progress Notes (Signed)
Subjective:  Feels much better. No chest pain, dyspnea or palpitations. Laying flat in bed without orthopnea. BP has been stable and UOP excellent. Patient has reverted back to sinus rhythm with first degree AV block. Feels well and essentially asymptomatic. Has started to have some loose stools again.  Nursing having difficulty in getting him up to walk.  Objective:  Vital Signs in the last 24 hours: Temp:  [97.5 F (36.4 C)-97.9 F (36.6 C)] 97.6 F (36.4 C) (09/29 2346) Pulse Rate:  [67-74] 72 (09/30 0857) Resp:  [13-20] 18 (09/29 2346) BP: (108-125)/(69-96) 109/69 mmHg (09/30 0857) SpO2:  [98 %-100 %] 98 % (09/29 2346) Weight:  [83.6 kg (184 lb 4.9 oz)] 83.6 kg (184 lb 4.9 oz) (09/29 1448)  Intake/Output from previous day: 09/29 0701 - 09/30 0700 In: 1308 [P.O.:1180; I.V.:103; IV Piggyback:25] Out: 1025 [Urine:1025]  Physical Exam:  General appearance: alert, cooperative, appears older than stated age and no distress  Lungs: clear to auscultation bilaterally  Heart: S1,S2 normal, No gallop or murmur. Abdomen: soft, non-tender; bowel sounds normal; no masses, no organomegaly  Extremities: extremities normal, atraumatic, no cyanosis or edema  Neurologic: Grossly normal   Lab Results:  Recent Labs  11/28/12 0408 11/29/12 0438  WBC 5.2 5.8  HGB 10.3* 9.5*  PLT 76* 82*    Recent Labs  11/28/12 0408 11/29/12 0438  NA 143 143  K 3.5 3.3*  CL 110 110  CO2 21 22  GLUCOSE 174* 119*  BUN 56* 39*  CREATININE 1.69* 1.34   No results found for this basename: PROT, ALBUMIN, AST, ALT, ALKPHOS, BILITOT, BILIDIR, IBILI,  in the last 72 hours  BNP (last 3 results)  Recent Labs  09/25/12 0450 09/26/12 0530 10/06/12 2305  PROBNP 26983.0* 34516.0* 33980.0*      EKG: Atrial flutter with Variable conduction, with a ventricular rate of 100-110 beats a minute. LAD, LAFB, Right branch block, LVH with diffuse ST-T wave changes. Atrial flutter is new compared to the prior  EKG.  Telemetry 11/29/2012: Sinus rhythm with first-degree AV block. 11/30/2012: S. With first degree @ 76/min  Echocardiogram 09/13/2012 LVEF 20-25%, right ventricle moderately dilated and hypokinetic.  Assessment/Plan:  1. Acute on chronic systolic and diastolic heart failure, involving both the right ventricle and left ventricle. Biventricular failure. Now resolved and in chronic phase. Tolerating Coreg. Echocardiogram 09/13/2012 LVEF 20-25%, right ventricle moderately dilated and hypokinetic.  2. New onset of atrial flutter with Variable AV conduction. Patient is now back in sinus rhythm.  3. Acute kidney injury and acute renal failure, multifactorial etiology, dehydration, UTI and urosepsis along with prerenal failure from both LV and RV failure. Acute renal failure has resolved. 3. COPD and history of prior tobacco use. Patient's presentation is consistent with biventricular failure and chronic cor pulmonale.  4. Chronic thrombcytopenia, stable.  5. Anemia hypoproteinemia and failure to thrive.  Recommendation:  Patient has remained stable, hemodynamics are improved, Increase physical activity as tolerated. Will need PT and OT consult.  If diarrhea resolves, then start Bidil 1 po TID for CHF.  Pamella Pert, M.D. 11/30/2012, 9:24 AM [facility] Pager: [phone] Office: [phone] If no answer: [phone]

## 2012-11-30 NOTE — Progress Notes (Signed)
ANTIBIOTIC CONSULT NOTE   Pharmacy Consult for Zosyn Indication: enterobacter UTI  Allergies  Allergen Reactions  . Other Nausea And Vomiting    Patient stated drug is "kerein"    Patient Measurements: Height: 6\' 1"  (185.4 cm) Weight: 184 lb 4.9 oz (83.6 kg) (bed scale) IBW/kg (Calculated) : 79.9  Vital Signs: Temp: 97.6 F (36.4 C) (09/29 2346) Temp src: Oral (09/29 2346) BP: 125/81 mmHg (09/29 2346) Pulse Rate: 71 (09/29 2346) Intake/Output from previous day: 09/29 0701 - 09/30 0700 In: 1308 [P.O.:1180; I.V.:103; IV Piggyback:25] Out: 1025 [Urine:1025] Intake/Output from this shift:    Labs:  Recent Labs  11/28/12 0408 11/29/12 0438  WBC 5.2 5.8  HGB 10.3* 9.5*  PLT 76* 82*  CREATININE 1.69* 1.34   Estimated Creatinine Clearance: 64.6 ml/min (by C-G formula based on Cr of 1.34). No results found for this basename: VANCOTROUGH, Leodis Binet, VANCORANDOM, GENTTROUGH, GENTPEAK, GENTRANDOM, TOBRATROUGH, TOBRAPEAK, TOBRARND, AMIKACINPEAK, AMIKACINTROU, AMIKACIN,  in the last 72 hours   Microbiology: Recent Results (from the past 720 hour(s))  URINE CULTURE     Status: None   Collection Time    11/23/12  9:04 PM      Result Value Range Status   Specimen Description URINE, RANDOM   Final   Special Requests NONE   Final   Culture  Setup Time     Final   Value: 11/24/2012 01:28     Performed at [facility] Count     Final   Value: >=100,000 COLONIES/ML     Performed at [facility]   Culture     Final   Value: ENTEROBACTER AEROGENES     Performed at [facility]   Report Status 11/25/2012 FINAL   Final   Organism ID, Bacteria ENTEROBACTER AEROGENES   Final  CULTURE, BLOOD (ROUTINE X 2)     Status: None   Collection Time    11/23/12 10:10 PM      Result Value Range Status   Specimen Description BLOOD LEFT HAND   Final   Special Requests BOTTLES DRAWN AEROBIC ONLY   Final   Culture  Setup Time     Final   Value: 11/24/2012  01:01     Performed at [facility]   Culture     Final   Value:        BLOOD CULTURE RECEIVED NO GROWTH TO DATE CULTURE WILL BE HELD FOR 5 DAYS BEFORE ISSUING A FINAL NEGATIVE REPORT     Performed at [facility]   Report Status PENDING   Incomplete  CULTURE, BLOOD (ROUTINE X 2)     Status: None   Collection Time    11/23/12 10:15 PM      Result Value Range Status   Specimen Description BLOOD RIGHT HAND   Final   Special Requests BOTTLES DRAWN AEROBIC ONLY   Final   Culture  Setup Time     Final   Value: 11/24/2012 01:01     Performed at [facility]   Culture     Final   Value:        BLOOD CULTURE RECEIVED NO GROWTH TO DATE CULTURE WILL BE HELD FOR 5 DAYS BEFORE ISSUING A FINAL NEGATIVE REPORT     Performed at [facility]   Report Status PENDING   Incomplete  CLOSTRIDIUM DIFFICILE BY PCR     Status: None   Collection Time    11/24/12  3:31 AM      Result Value Range Status   C difficile by pcr NEGATIVE  NEGATIVE Final    Medical History: Past Medical History  Diagnosis Date  . Diabetes mellitus without complication   . CHF (congestive heart failure)   . Hypertension   . Cardiomyopathy, dilated   . Small bowel obstruction 10/2012  . COPD (chronic obstructive pulmonary disease)   . Herpes     BUTT AND BACK- 9/23 HEALED   Assessment: 62 y/o M here with A/CRF, digoxin toxicity. He is on D#6 zosyn for nterobacter UTI. Afebrile, wbc 5.8, renal function improving at Scr 5.02 >> 1.34 with CrCl 65.   Ceftriaxone 9/23>>9/24 Zosyn 9/25>>  9/23 Urine>>Enterobacter aerogenes (R-CTX, S-Levaquin, pip/tazo, bactrim) 9/23 Blood x 2>>ngtd 9/24 c-diff >> neg 9/30 c-diff >>   Goal of Therapy:  Eradication of infection  Plan:  -Continue zosyn 3.375 g IV Q 8 hrs (4 hr infusion.) -F/U renal function and cultures -F/U LOT  Thank you for allowing me to take part in this patient's care,  Bayard Hugger, PharmD, BCPS  Clinical Pharmacist  Pager:  [phone]   11/30/2012 8:43 AM

## 2012-12-01 DIAGNOSIS — I5043 Acute on chronic combined systolic (congestive) and diastolic (congestive) heart failure: Secondary | ICD-10-CM

## 2012-12-01 DIAGNOSIS — E119 Type 2 diabetes mellitus without complications: Secondary | ICD-10-CM

## 2012-12-01 LAB — BASIC METABOLIC PANEL
CO2: 25 mEq/L (ref 19–32)
Calcium: 8.1 mg/dL — ABNORMAL LOW (ref 8.4–10.5)
Creatinine, Ser: 1.18 mg/dL (ref 0.50–1.35)
GFR calc Af Amer: 75 mL/min — ABNORMAL LOW (ref >90)
Potassium: 3.2 mEq/L — ABNORMAL LOW (ref 3.5–5.1)
Sodium: 143 mEq/L (ref 135–145)

## 2012-12-01 LAB — CBC
Hemoglobin: 9.9 g/dL — ABNORMAL LOW (ref 13.0–17.0)
MCH: 25.4 pg — ABNORMAL LOW (ref 26.0–34.0)
MCV: 75.1 fL — ABNORMAL LOW (ref 78.0–100.0)
Platelets: 71 10*3/uL — ABNORMAL LOW (ref 150–400)
RBC: 3.9 MIL/uL — ABNORMAL LOW (ref 4.22–5.81)
WBC: 6.7 10*3/uL (ref 4.0–10.5)

## 2012-12-01 LAB — GLUCOSE, CAPILLARY
Glucose-Capillary: 73 mg/dL (ref 70–99)
Glucose-Capillary: 80 mg/dL (ref 70–99)

## 2012-12-01 MED ORDER — POTASSIUM CHLORIDE CRYS ER 20 MEQ PO TBCR
40.0000 meq | EXTENDED_RELEASE_TABLET | Freq: Two times a day (BID) | ORAL | Status: AC
  Administered 2012-12-01 (×2): 40 meq via ORAL
  Filled 2012-12-01 (×3): qty 2

## 2012-12-01 MED ORDER — GLUCERNA SHAKE PO LIQD
237.0000 mL | ORAL | Status: DC
  Administered 2012-12-02: 11:00:00 237 mL via ORAL

## 2012-12-01 MED ORDER — BOOST PLUS PO LIQD
237.0000 mL | ORAL | Status: DC
  Filled 2012-12-01 (×2): qty 237

## 2012-12-01 MED ORDER — ADULT MULTIVITAMIN W/MINERALS CH
1.0000 | ORAL_TABLET | Freq: Every day | ORAL | Status: DC
  Administered 2012-12-01 – 2012-12-02 (×2): 1 via ORAL
  Filled 2012-12-01 (×2): qty 1

## 2012-12-01 NOTE — Progress Notes (Signed)
[facility] PROGRESS NOTE Interim History: 62 year-old male with PCKD stage III was admitted with digitalis toxicity. He was admitted to the step down unit however his renal function continued to deteriorate and was then transferred to ICU. Patient was treated for cardiogenic shock. He is currently stable. Currently being treated for urinary tract infection. He has had numerous diarrheal episodes however C. difficile was negative   Intake/Output Summary (Last 24 hours) at 12/01/12 1149 Last data filed at 12/01/12 1043  Gross per 24 hour  Intake   1260 ml  Output   1051 ml  Net    209 ml       Filed Weights   11/29/12 0600 11/29/12 1448 12/01/12 0544  Weight: 82.9 kg (182 lb 12.2 oz) 83.6 kg (184 lb 4.9 oz) 86 kg (189 lb 9.5 oz)        Recent Labs  09/25/12 0450 09/26/12 0530 10/06/12 2305  PROBNP 16109.6* 34516.0* 33980.0*     Assessment/Plan: Acute on chronic combined systolic and diastolic congestive heart failure, NYHA class 4/  Cardiogenic shock: - was treated with milrinone and dopamine on admission due to hypotension. - echo revealed, EF 25%, R vent mod dilated. - On Coreg, Bidil and torsemide. - cont to be hemodynamically stable.  Acute renal failure/Dehydration: - Multifactorial, due to UTI, dehydration. - now resolved, Cr. At baseline 1.0  Physical deconditioning - PT/consulted.  Sepsis/Enterococcus UTI (lower urinary tract infection): - completed course.  Digoxin toxicity - treated with Digibind on admission. - avoid digoxin in future due to renal failure.  Atrial Flutter 2:1 conduction  -Tele suspicious for AFlutter- 12 lead confirms. -Cardiology consulted was given adenosine to confirm. -started on Amiodarone and coreg low dose.  Diabetes mellitus without complication - cont SSI.  Hypertension - stable.    Code Status: DNR Family Communication: none  Disposition Plan: inpatient   Consultants:  cardiology  Procedures: ECHO:  pending  Antibiotics:  Zosyn 7/7  HPI/Subjective: No complains, relates he cont to to improved. iarrhea resolved. Objective: Filed Vitals:   11/30/12 2029 11/30/12 2054 12/01/12 0544 12/01/12 1119  BP: 102/66 100/68 107/70 101/76  Pulse:   75 76  Temp:  97.6 F (36.4 C) 97.9 F (36.6 C)   TempSrc:  Oral Oral   Resp:  19 19   Height:      Weight:   86 kg (189 lb 9.5 oz)   SpO2:  97% 100%      Exam:  General: Alert, awake, oriented x3, in no acute distress.  HEENT: No bruits, no goiter.  Heart: Regular rate and rhythm, without murmurs, rubs, gallops.  Lungs: Good air movement, clear to auscultation Abdomen: Soft, nontender, nondistended, positive bowel sounds.  Neuro: Grossly intact, nonfocal.   Data Reviewed: Basic Metabolic Panel:  Recent Labs Lab 11/26/12 0418 11/26/12 1400 11/27/12 0600 11/28/12 0408 11/29/12 0438 12/01/12 0600  NA 138 137 140 143 143 143  K 3.1* 3.4* 3.0* 3.5 3.3* 3.2*  CL 103 104 107 110 110 110  CO2 22 22 22 21 22 25   GLUCOSE 102* 111* 117* 174* 119* 75  BUN 101* 93* 79* 56* 39* 22  CREATININE 3.21* 2.91* 2.42* 1.69* 1.34 1.18  CALCIUM 7.7* 7.7* 8.0* 7.9* 8.2* 8.1*  MG 1.1*  --   --  2.0 1.6  --   PHOS 2.8  --   --  2.2* 2.5  --    Liver Function Tests:  Recent Labs Lab 11/26/12 0418 11/27/12 0600  AST  --  34  ALT  --  30  ALKPHOS  --  119*  BILITOT  --  0.5  PROT  --  6.1  ALBUMIN 1.9* 1.8*   No results found for this basename: LIPASE, AMYLASE,  in the last 168 hours No results found for this basename: AMMONIA,  in the last 168 hours CBC:  Recent Labs Lab 11/26/12 0418 11/27/12 0600 11/28/12 0408 11/29/12 0438 12/01/12 0600  WBC 4.4 6.3 5.2 5.8 6.7  HGB 11.1* 11.0* 10.3* 9.5* 9.9*  HCT 33.1* 32.6* 30.9* 28.1* 29.3*  MCV 75.7* 74.9* 75.2* 74.1* 75.1*  PLT 78* 78* 76* 82* 71*   Cardiac Enzymes: No results found for this basename: CKTOTAL, CKMB, CKMBINDEX, TROPONINI,  in the last 168 hours BNP (last 3  results)  Recent Labs  09/25/12 0450 09/26/12 0530 10/06/12 2305  PROBNP 16109.6* 34516.0* 33980.0*   CBG:  Recent Labs Lab 11/30/12 1146 11/30/12 1630 11/30/12 2053 12/01/12 0557 12/01/12 1110  GLUCAP 83 89 149* 73 91    Recent Results (from the past 240 hour(s))  URINE CULTURE     Status: None   Collection Time    11/23/12  9:04 PM      Result Value Range Status   Specimen Description URINE, RANDOM   Final   Special Requests NONE   Final   Culture  Setup Time     Final   Value: 11/24/2012 01:28     Performed at [facility] Count     Final   Value: >=100,000 COLONIES/ML     Performed at [facility]   Culture     Final   Value: ENTEROBACTER AEROGENES     Performed at [facility]   Report Status 11/25/2012 FINAL   Final   Organism ID, Bacteria ENTEROBACTER AEROGENES   Final  CULTURE, BLOOD (ROUTINE X 2)     Status: None   Collection Time    11/23/12 10:10 PM      Result Value Range Status   Specimen Description BLOOD LEFT HAND   Final   Special Requests BOTTLES DRAWN AEROBIC ONLY   Final   Culture  Setup Time     Final   Value: 11/24/2012 01:01     Performed at [facility]   Culture     Final   Value: NO GROWTH 5 DAYS     Performed at [facility]   Report Status 11/30/2012 FINAL   Final  CULTURE, BLOOD (ROUTINE X 2)     Status: None   Collection Time    11/23/12 10:15 PM      Result Value Range Status   Specimen Description BLOOD RIGHT HAND   Final   Special Requests BOTTLES DRAWN AEROBIC ONLY   Final   Culture  Setup Time     Final   Value: 11/24/2012 01:01     Performed at [facility]   Culture     Final   Value: NO GROWTH 5 DAYS     Performed at [facility]   Report Status 11/30/2012 FINAL   Final  CLOSTRIDIUM DIFFICILE BY PCR     Status: None   Collection Time    11/24/12  3:31 AM      Result Value Range Status   C difficile by pcr NEGATIVE  NEGATIVE Final   CLOSTRIDIUM DIFFICILE BY PCR     Status: None   Collection Time    11/30/12  1:43 AM      Result Value Range Status   C difficile by pcr NEGATIVE  NEGATIVE Final     Studies: No results found.  Scheduled Meds: . amiodarone  200 mg Oral Daily  . apixaban  5 mg Oral Q12H  . carvedilol  3.125 mg Oral BID WC  . famotidine  20 mg Oral Daily  . insulin aspart  0-9 Units Subcutaneous TID WC  . piperacillin-tazobactam (ZOSYN)  IV  3.375 g Intravenous Q8H  . potassium chloride  10 mEq Oral Daily  . potassium chloride  40 mEq Oral BID  . sodium chloride  10-40 mL Intracatheter Q12H  . sodium chloride  3 mL Intravenous Q12H  . torsemide  20 mg Oral Daily   Continuous Infusions: . sodium chloride 20 mL/hr at 11/29/12 0700     Marinda Elk  [facility] Pager [phone]. If 8PM-8AM, please contact night-coverage at [email or web address], password [facility] 12/01/2012, 11:49 AM  LOS: 8 days

## 2012-12-01 NOTE — Progress Notes (Signed)
OT Cancellation Note  Patient Details Name: KARLON SCHLAFER MRN: [id number] DOB: 27-Dec-1950   Cancelled Treatment:    Reason Eval/Treat Not Completed: OT screened, no needs identified, will sign off  Order received, reviewed chart. Pt for transfer to SNF for rehab. No acute OT needs identified. Will sign off.     Earlie Raveling OTR/L [phone] 12/01/2012, 8:00 AM

## 2012-12-01 NOTE — Progress Notes (Signed)
NUTRITION FOLLOW UP  Intervention:   Provide Boost Plus,Glucerna Shake, and Magic Cup once daily for trial Encourage PO intake >75% of meals Provide Multivitamin with minerals  Nutrition Dx:   Inadequate oral intake related to decreased appetite as evidenced by patient report; ongoing 25-100% of meals  Goal:   Pt to meet >/= 90% of their estimated nutrition needs; not met  Monitor:   PO intake; poor- 50% of most meals Weight; 27 lb wt increase from 9/24 to 10/1 - ? Fluid Labs; low hemoglobin, low potassium, low calcium, low GFR I/O's: Net I/O since admission +8.7 L  Assessment:   62 year-old male with PCKD stage III was admitted with digitalis toxicity. He was admitted to the step down unit however his renal function continued to deteriorate and was then transferred to ICU. Patient was treated for cardiogenic shock. He is currently stable. Currently being treated for urinary tract infection. He has had numerous diarrheal episodes however C. difficile was negative  Pt reports his appetite is better and he is eating better than PTA. He reports eating 50% of most meals, 100% of some. He states that his appetite varies and sometimes the strong smell of food makes the food unappetizing. Pt willing to try some nutritional supplements.   Height: Ht Readings from Last 1 Encounters:  11/29/12 6\' 1"  (1.854 m)    Weight Status:   Wt Readings from Last 1 Encounters:  12/01/12 189 lb 9.5 oz (86 kg)    Re-estimated needs:  Kcal: 2000-2200  Protein: 100-110 gm  Fluid: 2.0-2.2 L  Skin: +3 generalized and RUE edema, +2 LUE edema, non-pitting RLE and LLE edema, +1 sacral edema  Diet Order: Cardiac   Intake/Output Summary (Last 24 hours) at 12/01/12 1616 Last data filed at 12/01/12 1426  Gross per 24 hour  Intake   1380 ml  Output   1101 ml  Net    279 ml    Last BM: 10/1, diarrhea   Labs:   Recent Labs Lab 11/26/12 0418  11/28/12 0408 11/29/12 0438 12/01/12 0600  NA 138   < > 143 143 143  K 3.1*  < > 3.5 3.3* 3.2*  CL 103  < > 110 110 110  CO2 22  < > 21 22 25   BUN 101*  < > 56* 39* 22  CREATININE 3.21*  < > 1.69* 1.34 1.18  CALCIUM 7.7*  < > 7.9* 8.2* 8.1*  MG 1.1*  --  2.0 1.6  --   PHOS 2.8  --  2.2* 2.5  --   GLUCOSE 102*  < > 174* 119* 75  < > = values in this interval not displayed.  CBG (last 3)   Recent Labs  11/30/12 2053 12/01/12 0557 12/01/12 1110  GLUCAP 149* 73 91    Scheduled Meds: . amiodarone  200 mg Oral Daily  . apixaban  5 mg Oral Q12H  . carvedilol  3.125 mg Oral BID WC  . famotidine  20 mg Oral Daily  . insulin aspart  0-9 Units Subcutaneous TID WC  . piperacillin-tazobactam (ZOSYN)  IV  3.375 g Intravenous Q8H  . potassium chloride  10 mEq Oral Daily  . potassium chloride  40 mEq Oral BID  . sodium chloride  10-40 mL Intracatheter Q12H  . sodium chloride  3 mL Intravenous Q12H  . torsemide  20 mg Oral Daily    Continuous Infusions: . sodium chloride 20 mL/hr at 11/29/12 0700    Caleb Bauer Electa Sniff  RD, LDN Inpatient Clinical Dietitian Pager: [phone] After Hours Pager: [phone]

## 2012-12-01 NOTE — Progress Notes (Signed)
Report giving to receiving RN. Patient is in stable condition and resting. No verbal complaints and no signs or symptoms of distress or discomfort. 

## 2012-12-01 NOTE — Progress Notes (Signed)
CSW Proofreader) confirmed with pt that he would like to dc back to [facility]. CSW notified facility of possible dc tomorrow. Pt niece asked to be notified when definite discharge date/time are known.   Caleb Bauer, LCSWA [phone]

## 2012-12-02 ENCOUNTER — Encounter: Payer: Self-pay | Admitting: Internal Medicine

## 2012-12-02 ENCOUNTER — Non-Acute Institutional Stay (SKILLED_NURSING_FACILITY): Payer: Medicare Other | Admitting: Internal Medicine

## 2012-12-02 DIAGNOSIS — L8990 Pressure ulcer of unspecified site, unspecified stage: Secondary | ICD-10-CM

## 2012-12-02 DIAGNOSIS — J449 Chronic obstructive pulmonary disease, unspecified: Secondary | ICD-10-CM

## 2012-12-02 DIAGNOSIS — I5043 Acute on chronic combined systolic (congestive) and diastolic (congestive) heart failure: Secondary | ICD-10-CM

## 2012-12-02 DIAGNOSIS — N390 Urinary tract infection, site not specified: Secondary | ICD-10-CM

## 2012-12-02 DIAGNOSIS — N179 Acute kidney failure, unspecified: Secondary | ICD-10-CM

## 2012-12-02 DIAGNOSIS — I4892 Unspecified atrial flutter: Secondary | ICD-10-CM

## 2012-12-02 DIAGNOSIS — I509 Heart failure, unspecified: Secondary | ICD-10-CM

## 2012-12-02 DIAGNOSIS — I10 Essential (primary) hypertension: Secondary | ICD-10-CM

## 2012-12-02 LAB — GLUCOSE, CAPILLARY
Glucose-Capillary: 96 mg/dL (ref 70–99)
Glucose-Capillary: 102 mg/dL — ABNORMAL HIGH (ref 70–99)

## 2012-12-02 LAB — PRO B NATRIURETIC PEPTIDE: Pro B Natriuretic peptide (BNP): 69963 pg/mL — ABNORMAL HIGH (ref 0–125)

## 2012-12-02 MED ORDER — HYDROCODONE-ACETAMINOPHEN 5-325 MG PO TABS: 1.0000 | ORAL_TABLET | Freq: Four times a day (QID) | ORAL | Status: DC | PRN

## 2012-12-02 MED ORDER — CARVEDILOL 3.125 MG PO TABS: 3.1250 mg | ORAL_TABLET | Freq: Two times a day (BID) | ORAL | Status: DC

## 2012-12-02 MED ORDER — APIXABAN 5 MG PO TABS: 5.0000 mg | ORAL_TABLET | Freq: Two times a day (BID) | ORAL | Status: DC

## 2012-12-02 NOTE — Progress Notes (Signed)
Called pt report to [facility] facility spoke with [facility] RN   Made staff aware that ambulance pick up is at 3:30 today. Has recent Hx of Herpes Zoster. Has 1 small lesion midback covered with Allevyn dressing. Had Rt IJ removed today. Dressing is not to be removed until Sat. If area starts bleeding do not remove original dressing just reinforce.

## 2012-12-02 NOTE — Assessment & Plan Note (Signed)
Pt had UTI with sepsis;treated in hosp and resolved

## 2012-12-02 NOTE — Assessment & Plan Note (Signed)
Continue scheduled duonebs

## 2012-12-02 NOTE — Progress Notes (Signed)
PT Cancellation Note  Patient Details Name: Caleb Bauer MRN: [id number] DOB: [birth year]   Cancelled Treatment:    Reason Eval/Treat Not Completed: Other (comment) (Pt deferred due to leaving for SNF at 3:30.)   [facility] 12/02/2012, 2:50 PM

## 2012-12-02 NOTE — Progress Notes (Signed)
MRN: [id number] Name: Caleb Bauer  Sex: male Age: 62 y.o. DOB: [birth year]  PSC #: Ronni Rumble Facility/Room: Level Of Care: SNF Provider: Merrilee Seashore D Emergency Contacts: Extended Emergency Contact Information Primary Emergency Contact: Simmons,Beatrice Address: [address]          [place], [place] [address] [place] Home Phone: [phone] Relation: Mother Secondary Emergency Contact: Kathe Mariner [place] Mobile Phone: [phone] Relation: Niece  Code Status: FULL  Allergies: Other  Chief Complaint  Patient presents with  . nursing home admission    HPI: Patient is 62 y.o. male who is admitted after being treated for ARF and acute on chronic CHF.  Past Medical History  Diagnosis Date  . Diabetes mellitus without complication   . CHF (congestive heart failure)   . Hypertension   . Cardiomyopathy, dilated   . Small bowel obstruction 10/2012  . COPD (chronic obstructive pulmonary disease)   . Herpes     BUTT AND BACK- 9/23 HEALED  . Atrial flutter by electrocardiogram 11/2012    Past Surgical History  Procedure Laterality Date  . Laparoscopic incisional / umbilical / ventral hernia repair        Medication List    Notice   This visit is during an admission. Changes to the med list made in this visit will be reflected in the After Visit Summary of the admission.    Medications were supposed to come over. They were reviewed and verified by me.   No orders of the defined types were placed in this encounter.    There is no immunization history for the selected administration types on file for this patient.  History  Substance Use Topics  . Smoking status: Former Smoker -- 0.25 packs/day for .5 years    Types: Cigarettes    Quit date: 09/28/2012  . Smokeless tobacco: Not on file  . Alcohol Use: No    Family history is noncontributory    Review of Systems  DATA OBTAINED: from patient GENERAL: Feels  well no fevers, fatigue, appetite changes SKIN: No itching, rash;discomfort over low back EYES: No eye pain, redness, discharge EARS: No earache, tinnitus, change in hearing NOSE: No congestion, drainage or bleeding  MOUTH/THROAT: No mouth or tooth pain, No sore throat, No difficulty chewing or swallowing  RESPIRATORY: No cough, wheezing, SOB CARDIAC: No chest pain, palpitations, lower extremity edema  GI: No abdominal pain, No N/V/D or constipation, No heartburn or reflux  GU: No dysuria, frequency or urgency, or incontinence  MUSCULOSKELETAL: No unrelieved bone/joint pain NEUROLOGIC: No headache, dizziness or focal weakness PSYCHIATRIC: No overt anxiety or sadness. Sleeps well. No behavior issue.   Filed Vitals:   12/02/12 2038  BP: 109/69  Pulse: 75  Temp: 98.2 F (36.8 C)  Resp: 18    Physical Exam  GENERAL APPEARANCE: Alert, conversant. Appropriately groomed. No acute distress.  SKIN: No diaphoresis rash; minimal abrasion of skin over central sacrum HEAD: Normocephalic, atraumatic  EYES: Conjunctiva/lids clear. Pupils round, reactive. EOMs intact.  EARS: External exam WNL, canals clear. Hearing grossly normal.  NOSE: No deformity or discharge.  MOUTH/THROAT: Lips w/o lesions  RESPIRATORY: Breathing is even, unlabored. Lung sounds are clear   CARDIOVASCULAR: Heart RRR no murmurs, rubs or gallops. No peripheral edema.   GASTROINTESTINAL: Abdomen is soft, non-tender, not distended w/ normal bowel sounds.  GENITOURINARY: Bladder non tender, not distended  MUSCULOSKELETAL: No abnormal joints or musculature NEUROLOGIC: Oriented X3. Cranial nerves 2-12 grossly  intact. Moves all extremities no tremor. PSYCHIATRIC: Mood and affect appropriate to situation, no behavioral issues  Patient Active Problem List   Diagnosis Date Noted  . Atrial flutter by electrocardiogram   . Physical deconditioning 11/30/2012  . Hypotension 11/25/2012  . Dehydration 11/25/2012  . CKD (chronic  kidney disease) stage 3, GFR 30-59 ml/min 11/25/2012  . Diarrhea 11/25/2012  . UTI (lower urinary tract infection) 11/25/2012  . Cardiogenic shock 11/25/2012  . Digoxin toxicity 11/23/2012  . Acute renal failure 11/23/2012  . COPD (chronic obstructive pulmonary disease)   . History of ventricular tachycardia 09/17/2012  . Abnormal transaminases 09/17/2012  . Thrombocytopenia, unspecified 09/11/2012  . Acute on chronic combined systolic and diastolic congestive heart failure, NYHA class 4   . Hypertension   . Diabetes mellitus without complication     CBC    Component Value Date/Time   WBC 6.7 12/01/2012 0600   RBC 3.90* 12/01/2012 0600   HGB 9.9* 12/01/2012 0600   HCT 29.3* 12/01/2012 0600   PLT 71* 12/01/2012 0600   MCV 75.1* 12/01/2012 0600   LYMPHSABS 2.5 11/24/2012 0530   MONOABS 0.6 11/24/2012 0530   EOSABS 0.3 11/24/2012 0530   BASOSABS 0.0 11/24/2012 0530    CMP     Component Value Date/Time   NA 143 12/01/2012 0600   K 3.2* 12/01/2012 0600   CL 110 12/01/2012 0600   CO2 25 12/01/2012 0600   GLUCOSE 75 12/01/2012 0600   BUN 22 12/01/2012 0600   CREATININE 1.18 12/01/2012 0600   CALCIUM 8.1* 12/01/2012 0600   PROT 6.1 11/27/2012 0600   ALBUMIN 1.8* 11/27/2012 0600   AST 34 11/27/2012 0600   ALT 30 11/27/2012 0600   ALKPHOS 119* 11/27/2012 0600   BILITOT 0.5 11/27/2012 0600   GFRNONAA 64* 12/01/2012 0600   GFRAA 75* 12/01/2012 0600    Assessment and Plan  Acute on chronic combined systolic and diastolic congestive heart failure, NYHA class 4 Main  problem; ECHO-25% EF, mod dilated R ventricle; continue coreg, bidil and demadex;fluid restrict to 1.5 liters daily;estimated dry weight 83 kg  Atrial flutter by electrocardiogram Noted while in telemetry-per cards consult started on Amiodarone and coreg low dose;pt started on Eliquis; DIG TO BE AVOIDED BECAUSE OF RENAL FAILURE  Acute renal failure Multifactoral due to dehydration, UTI;resolved, CR AT BASELINE 1.0; digoxin to be  avoided because ofd renal failure  UTI (lower urinary tract infection) Pt had UTI with sepsis;treated in hosp and resolved  Hypertension Stable on meds for heart failure  COPD (chronic obstructive pulmonary disease) Continue scheduled duonebs  SKIN BREAKDOWN ON SACRUM - WOUND NURSE TO SEE  Margit Hanks, MD

## 2012-12-02 NOTE — Clinical Social Work Note (Signed)
CSW has notified family, patient, and RN of patient's DC back to [facility]. CSW has requested ambulance for 3:30PM. Packet on patient's chart. CSW signing off.   Roddie Mc, [place], [place], [phone]

## 2012-12-02 NOTE — Discharge Summary (Signed)
Physician Discharge Summary  Caleb Bauer ZOX:[id number] DOB: February 18, 1951 DOA: 11/23/2012  PCP: Bufford Spikes, DO  Admit date: 11/23/2012 Discharge date: 12/02/2012  Time spent: 35 minutes  Recommendations for Outpatient Follow-up:  1. Follow up with cardiology in 1 week. 2. b-met next week send to cardiology 3. Restrict fluid to 1.5 L a day  BNP pending  Estimated dry weight 83 kg   Discharge Diagnoses:  Principal Problem:   Acute on chronic combined systolic and diastolic congestive heart failure, NYHA class 4 Active Problems:   Acute renal failure   Cardiogenic shock   Hypertension   Diabetes mellitus without complication   Thrombocytopenia, unspecified   History of ventricular tachycardia   COPD (chronic obstructive pulmonary disease)   Digoxin toxicity   Hypotension   Dehydration   CKD (chronic kidney disease) stage 3, GFR 30-59 ml/min   UTI (lower urinary tract infection)   Physical deconditioning   Discharge Condition: stable  Diet recommendation: low sodium  Filed Weights   11/29/12 1448 12/01/12 0544 12/02/12 0538  Weight: 83.6 kg (184 lb 4.9 oz) 86 kg (189 lb 9.5 oz) 86.9 kg (191 lb 9.3 oz)    History of present illness:  62 y.o. male who was admitted in July at [facility] for sepsis and at that time patient also was found to have dilated cardiomyopathy was brought to the ER today after patient's labs were found to be abnormal. Patient was found to have acute on chronic renal failure. In the ER patient also was found to be hyperkalemic and digoxin levels were found to be elevated. At this time Digibind has been already started. On-call nephrologist and cardiologist has been consulted. Patient will be transferred to [facility] for further management. Patient is agreeable to transfer. Dr. Allena Katz hospitalist will be accepting physician at [facility].  Patient was started on acyclovir part of this month for herpes zoster. Patient also was last month started on colchicine  and allopurinol for gout.  Patient states he has been having poor appetite for last few weeks and has been hardly eating anything. He has had one episode of diarrhea today and one last week. Patient otherwise denies any chest pain shortness of breath abdominal pain fever chills. 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   Hospital Course:  Acute on chronic combined systolic and diastolic congestive heart failure, NYHA class 4/ Cardiogenic shock:  - was treated with milrinone and dopamine on admission due to hypotension.  - echo revealed, EF 25%, R vent mod dilated.  - On Coreg, Bidil and torsemide. - cardiology consult, recommended to cont meds. - bmet in 1 week. - follow up with cardiology in 1 week.  Acute renal failure/Dehydration:  - Multifactorial, due to UTI, dehydration.  - now resolved, Cr. At baseline 1.0   Physical deconditioning  - PT/consulted.  - rec SNF.  Sepsis/Enterococcus UTI (lower urinary tract infection):  - completed course.  - UC enteroccocus.  Digoxin toxicity  - treated with Digibind on admission.  - avoid digoxin in future due to renal failure.   Atrial Flutter 2:1 conduction  -Tele suspicious for AFlutter- 12 lead confirms.  -Cardiology consulted was given adenosine to confirm.  -started on Amiodarone and coreg low dose.  - rate control.  Diabetes mellitus without complication  - cont SSI.   Hypertension  - stable.   Procedures:  CXR  Renal ultrasound  Consultations:  Cardiology  PCCM  Discharge Exam: Filed Vitals:   12/02/12 8119  BP:   Pulse:   Temp:   Resp: 18    General: a&o X3 Cardiovascular: RRR Respiratory: good air movement CTA B/L  Discharge Instructions     Medication List    STOP taking these medications       digoxin 0.125 MG tablet  Commonly known as:  LANOXIN     metoprolol tartrate 25 MG tablet  Commonly known as:  LOPRESSOR      TAKE these medications       allopurinol 100  MG tablet  Commonly known as:  ZYLOPRIM  Take 1 tablet (100 mg total) by mouth daily.     amiodarone 200 MG tablet  Commonly known as:  PACERONE  Take 1 tablet (200 mg total) by mouth daily.     apixaban 5 MG Tabs tablet  Commonly known as:  ELIQUIS  Take 1 tablet (5 mg total) by mouth every 12 (twelve) hours.     aspirin EC 81 MG tablet  Take 81 mg by mouth daily.     carvedilol 3.125 MG tablet  Commonly known as:  COREG  Take 1 tablet (3.125 mg total) by mouth 2 (two) times daily with a meal.     cholecalciferol 1000 UNITS tablet  Commonly known as:  VITAMIN D  Take 1,000 Units by mouth daily.     colchicine 0.6 MG tablet  Take 1 tablet (0.6 mg total) by mouth daily.     feeding supplement Liqd  Take 30 mLs by mouth 3 (three) times daily with meals.     HYDROcodone-acetaminophen 5-325 MG per tablet  Commonly known as:  NORCO/VICODIN  Take 1 tablet by mouth every 6 (six) hours as needed for pain.     ipratropium-albuterol 0.5-2.5 (3) MG/3ML Soln  Commonly known as:  DUONEB  Take 3 mLs by nebulization 4 (four) times daily.     isosorbide-hydrALAZINE 20-37.5 MG per tablet  Commonly known as:  BIDIL  Take 1 tablet by mouth 3 (three) times daily.     mirtazapine 7.5 MG tablet  Commonly known as:  REMERON  Take 7.5 mg by mouth at bedtime.     multivitamin with minerals Tabs tablet  Take 2 tablets by mouth daily.     omeprazole 40 MG capsule  Commonly known as:  PRILOSEC  Take 40 mg by mouth daily.     potassium chloride SA 20 MEQ tablet  Commonly known as:  K-DUR,KLOR-CON  Take 40 mEq by mouth daily.     torsemide 20 MG tablet  Commonly known as:  DEMADEX  Take 2 tablets (40 mg total) by mouth 2 (two) times daily.       Allergies  Allergen Reactions  . Other Nausea And Vomiting    Patient stated drug is "kerein"   Follow-up Information   Follow up with Pamella Pert, MD In 1 week. (hospital follow up)    Specialty:  Cardiology   Contact  information:   [address] [place] [address] [phone]        The results of significant diagnostics from this hospitalization (including imaging, microbiology, ancillary and laboratory) are listed below for reference.    Significant Diagnostic Studies: US Renal  11/23/2012   CLINICAL DATA:  Renal failure.  EXAM: RENAL/URINARY TRACT ULTRASOUND COMPLETE  COMPARISON:  Renal ultrasound 10/07/2012 and CT abdomen and pelvis 10/08/2012.  FINDINGS: Right Kidney  Length: Measures 10.9 cm. There is some increase in cortical echogenicity. No stone, mass or hydronephrosis.  Left Kidney  Length: Measures 11.1 cm There is some increase in cortical echogenicity. No stone, mass or hydronephrosis.  Bladder: Bilateral ureteral jets are identified. No abnormality is seen.  IMPRESSION: Negative for hydronephrosis or acute abnormality.  Mildly increased cortical echogenicity compatible with medical renal disease.   Electronically Signed   By: Drusilla Kanner M.D.   On: 11/23/2012 23:14   Dg Chest Port 1 View  11/26/2012   CLINICAL DATA:  Central venous catheter placement  EXAM: PORTABLE CHEST - 1 VIEW  COMPARISON:  11/25/2012  FINDINGS: Right jugular catheter tip in the SVC. No pneumothorax  Cardiac enlargement with vascular congestion. Mild bibasilar atelectasis unchanged.  IMPRESSION: Satisfactory central venous catheter placement. Otherwise no change.   Electronically Signed   By: Marlan Palau M.D.   On: 11/26/2012 12:56   Dg Chest Port 1 View  11/25/2012   CLINICAL DATA:  Diabetes, hypertension, COPD, dilated cardiomyopathy ; digoxin toxicity, CHF, question pneumonia  EXAM: PORTABLE CHEST - 1 VIEW  COMPARISON:  Portable exam 1718 hr compared to 10/06/2012  FINDINGS: Enlargement of cardiac silhouette with pulmonary vascular congestion.  Tortuous aorta.  No acute failure or consolidation.  Significantly improved aeration at left base since previous exam.  No pleural effusion or  pneumothorax.  Bones unremarkable.  IMPRESSION: Enlargement of cardiac silhouette with pulmonary vascular congestion.  No acute abnormalities.   Electronically Signed   By: Ulyses Southward M.D.   On: 11/25/2012 17:28    Microbiology: Recent Results (from the past 240 hour(s))  URINE CULTURE     Status: None   Collection Time    11/23/12  9:04 PM      Result Value Range Status   Specimen Description URINE, RANDOM   Final   Special Requests NONE   Final   Culture  Setup Time     Final   Value: 11/24/2012 01:28     Performed at [facility] Count     Final   Value: >=100,000 COLONIES/ML     Performed at [facility]   Culture     Final   Value: ENTEROBACTER AEROGENES     Performed at [facility]   Report Status 11/25/2012 FINAL   Final   Organism ID, Bacteria ENTEROBACTER AEROGENES   Final  CULTURE, BLOOD (ROUTINE X 2)     Status: None   Collection Time    11/23/12 10:10 PM      Result Value Range Status   Specimen Description BLOOD LEFT HAND   Final   Special Requests BOTTLES DRAWN AEROBIC ONLY   Final   Culture  Setup Time     Final   Value: 11/24/2012 01:01     Performed at [facility]   Culture     Final   Value: NO GROWTH 5 DAYS     Performed at [facility]   Report Status 11/30/2012 FINAL   Final  CULTURE, BLOOD (ROUTINE X 2)     Status: None   Collection Time    11/23/12 10:15 PM      Result Value Range Status   Specimen Description BLOOD RIGHT HAND   Final   Special Requests BOTTLES DRAWN AEROBIC ONLY   Final   Culture  Setup Time     Final   Value: 11/24/2012 01:01     Performed at [facility]   Culture     Final   Value: NO GROWTH 5  DAYS     Performed at [facility]   Report Status 11/30/2012 FINAL   Final  CLOSTRIDIUM DIFFICILE BY PCR     Status: None   Collection Time    11/24/12  3:31 AM      Result Value Range Status   C difficile by pcr NEGATIVE  NEGATIVE Final  CLOSTRIDIUM  DIFFICILE BY PCR     Status: None   Collection Time    11/30/12  1:43 AM      Result Value Range Status   C difficile by pcr NEGATIVE  NEGATIVE Final     Labs: Basic Metabolic Panel:  Recent Labs Lab 11/26/12 0418 11/26/12 1400 11/27/12 0600 11/28/12 0408 11/29/12 0438 12/01/12 0600  NA 138 137 140 143 143 143  K 3.1* 3.4* 3.0* 3.5 3.3* 3.2*  CL 103 104 107 110 110 110  CO2 22 22 22 21 22 25   GLUCOSE 102* 111* 117* 174* 119* 75  BUN 101* 93* 79* 56* 39* 22  CREATININE 3.21* 2.91* 2.42* 1.69* 1.34 1.18  CALCIUM 7.7* 7.7* 8.0* 7.9* 8.2* 8.1*  MG 1.1*  --   --  2.0 1.6  --   PHOS 2.8  --   --  2.2* 2.5  --    Liver Function Tests:  Recent Labs Lab 11/26/12 0418 11/27/12 0600  AST  --  34  ALT  --  30  ALKPHOS  --  119*  BILITOT  --  0.5  PROT  --  6.1  ALBUMIN 1.9* 1.8*   No results found for this basename: LIPASE, AMYLASE,  in the last 168 hours No results found for this basename: AMMONIA,  in the last 168 hours CBC:  Recent Labs Lab 11/26/12 0418 11/27/12 0600 11/28/12 0408 11/29/12 0438 12/01/12 0600  WBC 4.4 6.3 5.2 5.8 6.7  HGB 11.1* 11.0* 10.3* 9.5* 9.9*  HCT 33.1* 32.6* 30.9* 28.1* 29.3*  MCV 75.7* 74.9* 75.2* 74.1* 75.1*  PLT 78* 78* 76* 82* 71*   Cardiac Enzymes: No results found for this basename: CKTOTAL, CKMB, CKMBINDEX, TROPONINI,  in the last 168 hours BNP: BNP (last 3 results)  Recent Labs  09/25/12 0450 09/26/12 0530 10/06/12 2305  PROBNP 26983.0* 34516.0* 33980.0*   CBG:  Recent Labs Lab 12/01/12 1110 12/01/12 1623 12/01/12 2130 12/01/12 2324 12/02/12 0656  GLUCAP 91 80 75 141* 96       Signed:  FELIZ Bauer, Caleb Bauer  [facility] 12/02/2012, 10:18 AM

## 2012-12-02 NOTE — Assessment & Plan Note (Signed)
Main  problem; ECHO-25% EF, mod dilated R ventricle; continue coreg, bidil and demadex;fluid restrict to 1.5 liters daily;estimated dry weight 83 kg

## 2012-12-02 NOTE — Progress Notes (Signed)
Pt discharged to [facility] [place]  per ambulance with all belongings. Called friend to let them know that pt has left for facility.

## 2012-12-02 NOTE — Assessment & Plan Note (Addendum)
Noted while in telemetry-per cards consult started on Amiodarone and coreg low dose;pt started on Eliquis; DIG TO BE AVOIDED BECAUSE OF RENAL FAILURE

## 2012-12-02 NOTE — Assessment & Plan Note (Signed)
Stable on meds for heart failure

## 2012-12-02 NOTE — Assessment & Plan Note (Addendum)
Multifactoral due to dehydration, UTI;resolved, CR AT BASELINE 1.0; digoxin to be avoided because of renal failure

## 2012-12-06 ENCOUNTER — Emergency Department (HOSPITAL_COMMUNITY): Payer: Medicare Other

## 2012-12-06 ENCOUNTER — Encounter (HOSPITAL_COMMUNITY): Payer: Self-pay | Admitting: *Deleted

## 2012-12-06 ENCOUNTER — Inpatient Hospital Stay (HOSPITAL_COMMUNITY)
Admission: EM | Admit: 2012-12-06 | Discharge: 2012-12-09 | DRG: 312 | Disposition: A | Discharge status: Skilled Nursing Facility | Payer: Medicare Other | Attending: Internal Medicine | Admitting: Internal Medicine

## 2012-12-06 DIAGNOSIS — I452 Bifascicular block: Secondary | ICD-10-CM | POA: Diagnosis present

## 2012-12-06 DIAGNOSIS — I951 Orthostatic hypotension: Principal | ICD-10-CM | POA: Diagnosis present

## 2012-12-06 DIAGNOSIS — N183 Chronic kidney disease, stage 3 unspecified: Secondary | ICD-10-CM | POA: Diagnosis present

## 2012-12-06 DIAGNOSIS — E119 Type 2 diabetes mellitus without complications: Secondary | ICD-10-CM | POA: Diagnosis present

## 2012-12-06 DIAGNOSIS — J4489 Other specified chronic obstructive pulmonary disease: Secondary | ICD-10-CM | POA: Diagnosis present

## 2012-12-06 DIAGNOSIS — M109 Gout, unspecified: Secondary | ICD-10-CM | POA: Diagnosis present

## 2012-12-06 DIAGNOSIS — N179 Acute kidney failure, unspecified: Secondary | ICD-10-CM

## 2012-12-06 DIAGNOSIS — Z7982 Long term (current) use of aspirin: Secondary | ICD-10-CM

## 2012-12-06 DIAGNOSIS — B9689 Other specified bacterial agents as the cause of diseases classified elsewhere: Secondary | ICD-10-CM | POA: Diagnosis present

## 2012-12-06 DIAGNOSIS — T460X5A Adverse effect of cardiac-stimulant glycosides and drugs of similar action, initial encounter: Secondary | ICD-10-CM | POA: Diagnosis present

## 2012-12-06 DIAGNOSIS — I10 Essential (primary) hypertension: Secondary | ICD-10-CM

## 2012-12-06 DIAGNOSIS — I4892 Unspecified atrial flutter: Secondary | ICD-10-CM | POA: Diagnosis present

## 2012-12-06 DIAGNOSIS — N058 Unspecified nephritic syndrome with other morphologic changes: Secondary | ICD-10-CM | POA: Diagnosis present

## 2012-12-06 DIAGNOSIS — N390 Urinary tract infection, site not specified: Secondary | ICD-10-CM

## 2012-12-06 DIAGNOSIS — E1129 Type 2 diabetes mellitus with other diabetic kidney complication: Secondary | ICD-10-CM | POA: Diagnosis present

## 2012-12-06 DIAGNOSIS — I509 Heart failure, unspecified: Secondary | ICD-10-CM

## 2012-12-06 DIAGNOSIS — R112 Nausea with vomiting, unspecified: Secondary | ICD-10-CM | POA: Diagnosis present

## 2012-12-06 DIAGNOSIS — I4891 Unspecified atrial fibrillation: Secondary | ICD-10-CM | POA: Diagnosis present

## 2012-12-06 DIAGNOSIS — K219 Gastro-esophageal reflux disease without esophagitis: Secondary | ICD-10-CM | POA: Diagnosis present

## 2012-12-06 DIAGNOSIS — Z79899 Other long term (current) drug therapy: Secondary | ICD-10-CM

## 2012-12-06 DIAGNOSIS — I5022 Chronic systolic (congestive) heart failure: Secondary | ICD-10-CM | POA: Diagnosis present

## 2012-12-06 DIAGNOSIS — E875 Hyperkalemia: Secondary | ICD-10-CM | POA: Diagnosis present

## 2012-12-06 DIAGNOSIS — I129 Hypertensive chronic kidney disease with stage 1 through stage 4 chronic kidney disease, or unspecified chronic kidney disease: Secondary | ICD-10-CM | POA: Diagnosis present

## 2012-12-06 DIAGNOSIS — I959 Hypotension, unspecified: Secondary | ICD-10-CM | POA: Diagnosis present

## 2012-12-06 DIAGNOSIS — Z87891 Personal history of nicotine dependence: Secondary | ICD-10-CM

## 2012-12-06 DIAGNOSIS — E860 Dehydration: Secondary | ICD-10-CM | POA: Diagnosis present

## 2012-12-06 DIAGNOSIS — R1110 Vomiting, unspecified: Secondary | ICD-10-CM

## 2012-12-06 DIAGNOSIS — I428 Other cardiomyopathies: Secondary | ICD-10-CM | POA: Diagnosis present

## 2012-12-06 DIAGNOSIS — R5381 Other malaise: Secondary | ICD-10-CM

## 2012-12-06 DIAGNOSIS — J449 Chronic obstructive pulmonary disease, unspecified: Secondary | ICD-10-CM

## 2012-12-06 LAB — HEPATIC FUNCTION PANEL
ALT: 24 U/L (ref 0–53)
AST: 25 U/L (ref 0–37)
Albumin: 1.7 g/dL — ABNORMAL LOW (ref 3.5–5.2)
Alkaline Phosphatase: 128 U/L — ABNORMAL HIGH (ref 39–117)
Bilirubin, Direct: 0.2 mg/dL (ref 0.0–0.3)
Total Bilirubin: 0.7 mg/dL (ref 0.3–1.2)
Total Protein: 6.3 g/dL (ref 6.0–8.3)

## 2012-12-06 LAB — GLUCOSE, CAPILLARY
Glucose-Capillary: 77 mg/dL (ref 70–99)
Glucose-Capillary: 84 mg/dL (ref 70–99)

## 2012-12-06 LAB — CBC WITH DIFFERENTIAL/PLATELET
Basophils Relative: 1 % (ref 0–1)
HCT: 32.7 % — ABNORMAL LOW (ref 39.0–52.0)
Hemoglobin: 10.9 g/dL — ABNORMAL LOW (ref 13.0–17.0)
Lymphocytes Relative: 28 % (ref 12–46)
MCHC: 33.3 g/dL (ref 30.0–36.0)
Monocytes Absolute: 0.7 10*3/uL (ref 0.1–1.0)
Monocytes Relative: 12 % (ref 3–12)
Neutro Abs: 2.6 10*3/uL (ref 1.7–7.7)
Neutrophils Relative %: 42 % — ABNORMAL LOW (ref 43–77)
RBC: 4.28 MIL/uL (ref 4.22–5.81)
WBC: 6.2 10*3/uL (ref 4.0–10.5)

## 2012-12-06 LAB — URINALYSIS, ROUTINE W REFLEX MICROSCOPIC
Nitrite: NEGATIVE
Specific Gravity, Urine: 1.011 (ref 1.005–1.030)
Urobilinogen, UA: 0.2 mg/dL (ref 0.0–1.0)
pH: 5 (ref 5.0–8.0)

## 2012-12-06 LAB — PRO B NATRIURETIC PEPTIDE: Pro B Natriuretic peptide (BNP): 16524 pg/mL — ABNORMAL HIGH (ref 0–125)

## 2012-12-06 LAB — URINE MICROSCOPIC-ADD ON

## 2012-12-06 LAB — BASIC METABOLIC PANEL
BUN: 29 mg/dL — ABNORMAL HIGH (ref 6–23)
CO2: 33 mEq/L — ABNORMAL HIGH (ref 19–32)
Chloride: 96 mEq/L (ref 96–112)
Creatinine, Ser: 1.42 mg/dL — ABNORMAL HIGH (ref 0.50–1.35)
GFR calc Af Amer: 60 mL/min — ABNORMAL LOW (ref >90)
Potassium: 5.2 mEq/L — ABNORMAL HIGH (ref 3.5–5.1)

## 2012-12-06 MED ORDER — PNEUMOCOCCAL VAC POLYVALENT 25 MCG/0.5ML IJ INJ
0.5000 mL | INJECTION | INTRAMUSCULAR | Status: AC
  Administered 2012-12-07: 10:00:00 0.5 mL via INTRAMUSCULAR
  Filled 2012-12-06: qty 0.5

## 2012-12-06 MED ORDER — INSULIN ASPART 100 UNIT/ML ~~LOC~~ SOLN
0.0000 [IU] | Freq: Three times a day (TID) | SUBCUTANEOUS | Status: DC
  Administered 2012-12-07: 12:00:00 2 [IU] via SUBCUTANEOUS
  Administered 2012-12-08: 17:00:00 1 [IU] via SUBCUTANEOUS

## 2012-12-06 MED ORDER — ADULT MULTIVITAMIN W/MINERALS CH
2.0000 | ORAL_TABLET | Freq: Every day | ORAL | Status: DC
  Administered 2012-12-07 – 2012-12-09 (×3): 2 via ORAL
  Filled 2012-12-06 (×3): qty 2

## 2012-12-06 MED ORDER — IPRATROPIUM BROMIDE 0.02 % IN SOLN
0.5000 mg | Freq: Four times a day (QID) | RESPIRATORY_TRACT | Status: DC
  Administered 2012-12-07 – 2012-12-09 (×10): 0.5 mg via RESPIRATORY_TRACT
  Filled 2012-12-06 (×11): qty 2.5

## 2012-12-06 MED ORDER — ALLOPURINOL 100 MG PO TABS
100.0000 mg | ORAL_TABLET | Freq: Every day | ORAL | Status: DC
  Administered 2012-12-07 – 2012-12-09 (×3): 100 mg via ORAL
  Filled 2012-12-06 (×3): qty 1

## 2012-12-06 MED ORDER — SODIUM CHLORIDE 0.9 % IJ SOLN
3.0000 mL | Freq: Two times a day (BID) | INTRAMUSCULAR | Status: DC
  Administered 2012-12-07 – 2012-12-09 (×3): 3 mL via INTRAVENOUS

## 2012-12-06 MED ORDER — SODIUM CHLORIDE 0.9 % IJ SOLN
3.0000 mL | Freq: Two times a day (BID) | INTRAMUSCULAR | Status: DC
  Administered 2012-12-06: 3 mL via INTRAVENOUS

## 2012-12-06 MED ORDER — SODIUM CHLORIDE 0.9 % IV BOLUS (SEPSIS)
500.0000 mL | Freq: Once | INTRAVENOUS | Status: AC
  Administered 2012-12-06: 500 mL via INTRAVENOUS

## 2012-12-06 MED ORDER — ASPIRIN EC 81 MG PO TBEC
81.0000 mg | DELAYED_RELEASE_TABLET | Freq: Every day | ORAL | Status: DC
  Administered 2012-12-07 – 2012-12-09 (×3): 81 mg via ORAL
  Filled 2012-12-06 (×3): qty 1

## 2012-12-06 MED ORDER — HYDROCODONE-ACETAMINOPHEN 5-325 MG PO TABS: 1.0000 | ORAL_TABLET | Freq: Four times a day (QID) | ORAL | Status: DC | PRN

## 2012-12-06 MED ORDER — PRO-STAT SUGAR FREE PO LIQD
30.0000 mL | Freq: Three times a day (TID) | ORAL | Status: DC
  Administered 2012-12-07 – 2012-12-08 (×2): 30 mL via ORAL
  Filled 2012-12-06 (×10): qty 30

## 2012-12-06 MED ORDER — CARVEDILOL 3.125 MG PO TABS
3.1250 mg | ORAL_TABLET | Freq: Two times a day (BID) | ORAL | Status: DC
  Administered 2012-12-07: 3.125 mg via ORAL
  Filled 2012-12-06 (×3): qty 1

## 2012-12-06 MED ORDER — IPRATROPIUM-ALBUTEROL 0.5-2.5 (3) MG/3ML IN SOLN: 3.0000 mL | Freq: Four times a day (QID) | RESPIRATORY_TRACT | Status: DC

## 2012-12-06 MED ORDER — ACETAMINOPHEN 650 MG RE SUPP: 650.0000 mg | Freq: Four times a day (QID) | RECTAL | Status: DC | PRN

## 2012-12-06 MED ORDER — ALBUTEROL SULFATE (5 MG/ML) 0.5% IN NEBU
2.5000 mg | INHALATION_SOLUTION | Freq: Four times a day (QID) | RESPIRATORY_TRACT | Status: DC
  Administered 2012-12-07 – 2012-12-09 (×10): 2.5 mg via RESPIRATORY_TRACT
  Filled 2012-12-06 (×11): qty 0.5

## 2012-12-06 MED ORDER — ONDANSETRON HCL 4 MG PO TABS: 4.0000 mg | ORAL_TABLET | Freq: Four times a day (QID) | ORAL | Status: DC | PRN

## 2012-12-06 MED ORDER — MIRTAZAPINE 7.5 MG PO TABS
7.5000 mg | ORAL_TABLET | Freq: Every day | ORAL | Status: DC
  Administered 2012-12-06 – 2012-12-08 (×3): 7.5 mg via ORAL
  Filled 2012-12-06 (×4): qty 1

## 2012-12-06 MED ORDER — AMIODARONE HCL 200 MG PO TABS
200.0000 mg | ORAL_TABLET | Freq: Every day | ORAL | Status: DC
  Administered 2012-12-07 – 2012-12-09 (×3): 200 mg via ORAL
  Filled 2012-12-06 (×3): qty 1

## 2012-12-06 MED ORDER — ONDANSETRON HCL 4 MG/2ML IJ SOLN
4.0000 mg | Freq: Once | INTRAMUSCULAR | Status: AC
  Administered 2012-12-06: 4 mg via INTRAVENOUS
  Filled 2012-12-06: qty 2

## 2012-12-06 MED ORDER — PANTOPRAZOLE SODIUM 40 MG PO TBEC
40.0000 mg | DELAYED_RELEASE_TABLET | Freq: Every day | ORAL | Status: DC
  Administered 2012-12-07 – 2012-12-09 (×3): 40 mg via ORAL
  Filled 2012-12-06 (×3): qty 1

## 2012-12-06 MED ORDER — ONDANSETRON HCL 4 MG/2ML IJ SOLN: 4.0000 mg | Freq: Four times a day (QID) | INTRAMUSCULAR | Status: DC | PRN

## 2012-12-06 MED ORDER — INFLUENZA VAC SPLIT QUAD 0.5 ML IM SUSP
0.5000 mL | INTRAMUSCULAR | Status: AC
  Administered 2012-12-07: 0.5 mL via INTRAMUSCULAR
  Filled 2012-12-06: qty 0.5

## 2012-12-06 MED ORDER — APIXABAN 5 MG PO TABS
5.0000 mg | ORAL_TABLET | Freq: Two times a day (BID) | ORAL | Status: DC
  Administered 2012-12-06 – 2012-12-09 (×6): 5 mg via ORAL
  Filled 2012-12-06 (×7): qty 1

## 2012-12-06 MED ORDER — COLCHICINE 0.6 MG PO TABS
0.6000 mg | ORAL_TABLET | Freq: Every day | ORAL | Status: DC
  Administered 2012-12-07 – 2012-12-09 (×3): 0.6 mg via ORAL
  Filled 2012-12-06 (×3): qty 1

## 2012-12-06 MED ORDER — ACETAMINOPHEN 325 MG PO TABS: 650.0000 mg | ORAL_TABLET | Freq: Four times a day (QID) | ORAL | Status: DC | PRN

## 2012-12-06 NOTE — H&P (Signed)
[facility] History and Physical  Caleb Bauer ZOX:[id number] DOB: November 13, 1950 DOA: 12/06/2012  Referring physician: ER physician. PCP: Bufford Spikes, DO  Specialists: Dr. Jacinto Halim. Cardiologist.  Chief Complaint: Nausea vomiting and hypotension.  HPI: Caleb Bauer is a 62 y.o. male who was recently admitted for digoxin toxicity and at that time patient was found to be in cardiogenic shock and was placed on vasopressors had gone for routine followup to his cardiologist where he was found to have mild hypotension with nausea vomiting. Patient states that he has taken his medications in an empty stomach and felt nauseous in the cardiologist clinic. Denies any abdominal pain diarrhea. Medication list sure that he is being off digoxin now. Patient also was found to be hypotensive and in the ER he was still found to be hypotensive and was given 500 cc normal saline bolus after which his blood pressure improved to above 90 systolic. Presently patient is asymptomatic denies any chest pain shortness of breath abdominal pain. Digoxin levels and LFTs are pending. Patient on exam does not have any abdominal tenderness. Patient will be admitted for further management.  Review of Systems: As presented in the history of presenting illness, rest negative.  Past Medical History  Diagnosis Date  . Diabetes mellitus without complication   . CHF (congestive heart failure)   . Hypertension   . Cardiomyopathy, dilated   . Small bowel obstruction 10/2012  . COPD (chronic obstructive pulmonary disease)   . Herpes     BUTT AND BACK- 9/23 HEALED  . Atrial flutter by electrocardiogram 11/2012   Past Surgical History  Procedure Laterality Date  . Laparoscopic incisional / umbilical / ventral hernia repair     Social History:  reports that he quit smoking about 2 months ago. His smoking use included Cigarettes. He has a .125 pack-year smoking history. He does not have any smokeless tobacco history on file. He  reports that he does not drink alcohol or use illicit drugs. Nursing home. where does patient live-- Yes. Can patient participate in ADLs?  Allergies  Allergen Reactions  . Other Nausea And Vomiting    Patient stated drug is "kerein"    Family History  Problem Relation Age of Onset  . Cancer Neg Hx       Prior to Admission medications   Medication Sig Start Date End Date Taking? Authorizing Provider  allopurinol (ZYLOPRIM) 100 MG tablet Take 1 tablet (100 mg total) by mouth daily. 10/20/12  Yes Tiffany L Reed, DO  amiodarone (PACERONE) 200 MG tablet Take 1 tablet (200 mg total) by mouth daily. 09/24/12  Yes Richarda Overlie, MD  apixaban (ELIQUIS) 5 MG TABS tablet Take 1 tablet (5 mg total) by mouth every 12 (twelve) hours. 12/02/12  Yes Marinda Elk, MD  aspirin EC 81 MG tablet Take 81 mg by mouth daily.   Yes Historical Provider, MD  carvedilol (COREG) 3.125 MG tablet Take 1 tablet (3.125 mg total) by mouth 2 (two) times daily with a meal. 12/02/12  Yes Marinda Elk, MD  colchicine 0.6 MG tablet Take 1 tablet (0.6 mg total) by mouth daily. 10/20/12  Yes Tiffany L Reed, DO  feeding supplement (PRO-STAT SUGAR FREE 64) LIQD Take 30 mLs by mouth 3 (three) times daily with meals.   Yes Historical Provider, MD  HYDROcodone-acetaminophen (NORCO/VICODIN) 5-325 MG per tablet Take 1 tablet by mouth every 6 (six) hours as needed for pain. 12/02/12  Yes Marinda Elk, MD  ipratropium-albuterol (DUONEB) 0.5-2.5 (  3) MG/3ML SOLN Take 3 mLs by nebulization 4 (four) times daily.   Yes Historical Provider, MD  isosorbide-hydrALAZINE (BIDIL) 20-37.5 MG per tablet Take 1 tablet by mouth 3 (three) times daily. 09/24/12  Yes Richarda Overlie, MD  mirtazapine (REMERON) 7.5 MG tablet Take 7.5 mg by mouth at bedtime.   Yes Historical Provider, MD  Multiple Vitamin (MULTIVITAMIN WITH MINERALS) TABS tablet Take 2 tablets by mouth daily.    Yes Historical Provider, MD  omeprazole (PRILOSEC) 40 MG capsule  Take 40 mg by mouth daily.   Yes Historical Provider, MD  potassium chloride SA (K-DUR,KLOR-CON) 20 MEQ tablet Take 40 mEq by mouth daily.   Yes Historical Provider, MD  torsemide (DEMADEX) 20 MG tablet Take 2 tablets (40 mg total) by mouth 2 (two) times daily. 09/27/12  Yes Richarda Overlie, MD  cholecalciferol (VITAMIN D) 1000 UNITS tablet Take 1,000 Units by mouth daily.    Historical Provider, MD   Physical Exam: Filed Vitals:   12/06/12 1930 12/06/12 2000 12/06/12 2030 12/06/12 2130  BP: 104/83 84/49 94/64  98/65  Pulse: 75 67 65 61  Temp:      TempSrc:      Resp: 24 12 22 25   SpO2: 100% 98% 97% 98%     General:  Well-developed well-nourished.  Eyes: Anicteric no pallor.  ENT: No discharge from the ears eyes nose or mouth.  Neck:  No mass felt.  Cardiovascular: S1-S2 heard.  Respiratory: No rhonchi or crepitations.  Abdomen: Soft nontender bowel sounds present. No guarding no rigidity.  Skin: No rash.  Musculoskeletal: No edema.  Psychiatric: Appears normal.  Neurologic: Alert awake oriented to time place and person. Moves all extremities.  Labs on Admission:  Basic Metabolic Panel:  Recent Labs Lab 12/01/12 0600 12/06/12 1617 12/06/12 1807  NA 143 139  --   K 3.2* 5.2* 4.0  CL 110 96  --   CO2 25 33*  --   GLUCOSE 75 82  --   BUN 22 29*  --   CREATININE 1.18 1.42*  --   CALCIUM 8.1* 7.4*  --    Liver Function Tests: No results found for this basename: AST, ALT, ALKPHOS, BILITOT, PROT, ALBUMIN,  in the last 168 hours No results found for this basename: LIPASE, AMYLASE,  in the last 168 hours No results found for this basename: AMMONIA,  in the last 168 hours CBC:  Recent Labs Lab 12/01/12 0600 12/06/12 1617  WBC 6.7 6.2  NEUTROABS  --  2.6  HGB 9.9* 10.9*  HCT 29.3* 32.7*  MCV 75.1* 76.4*  PLT 71* 139*   Cardiac Enzymes: No results found for this basename: CKTOTAL, CKMB, CKMBINDEX, TROPONINI,  in the last 168 hours  BNP (last 3  results)  Recent Labs  10/06/12 2305 12/02/12 1610 12/06/12 1618  PROBNP 33980.0* 69963.0* 16524.0*   CBG:  Recent Labs Lab 12/01/12 2324 12/02/12 0656 12/02/12 1123 12/02/12 1628 12/06/12 1716  GLUCAP 141* 96 90 102* 77    Radiological Exams on Admission: Dg Chest 2 View  12/06/2012   CLINICAL DATA:  Nausea and vomiting for 1 day, past smoking history  EXAM: CHEST  2 VIEW  COMPARISON:  11/26/2012  FINDINGS: Mild to moderate cardiomegaly. Vascular pattern normal. Right diaphragm is elevated. There is suboptimal positioning on both the frontal and lateral views with incomplete visualization of the left costophrenic angle and retrocardiac portion of the left lower lobe.  IMPRESSION: Study mildly limited by suboptimal positioning. No acute abnormalities.  Right diaphragm elevation was not present on multiple prior chest radiographs but was observed on 10/08/2012 CT scan. Stable mild to moderate cardiac enlargement.   Electronically Signed   By: Esperanza Heir M.D.   On: 12/06/2012 17:58    EKG: Independently reviewed. Normal sinus rhythm with RBBB.  Assessment/Plan Principal Problem:   Nausea and vomiting Active Problems:   Diabetes mellitus without complication   Hypotension   CKD (chronic kidney disease) stage 3, GFR 30-59 ml/min   Atrial flutter by electrocardiogram   1. Nausea and vomiting - patient only had one episode. LFTs are pending. If LFTs are elevated then we may have to hold amiodarone and will need further workup. Patient's digoxin levels are also pending but medication list shows that patient is no longer on digoxin. We will continue to observe. Diet as tolerated. 2. Hypotension - improved with 500 cc normal saline bolus. We will hold off patient's Bidil, diuretics for now and see blood pressure response. 3. Cardiomyopathy last EF measured this July was 20-25% - see #2. 4. Chronic kidney disease - closely follow intake output and metabolic panel. See  #2. 5. Diabetes mellitus2 presently on no medications - follow CBGs. Last A1c July this year was 6.2. 6. Recently diagnosed atrial flutter on apixaban - closely monitor rate. Patient is on metoprolol and amiodarone. Check LFTs because of nausea vomiting. 7. Recently admitted for digoxin toxicity and cardiogenic shock.    Code Status: Full code.  Family Communication: None.  Disposition Plan: Admit to inpatient.    Arsal Tappan N. [facility] Pager [phone].  If 7PM-7AM, please contact night-coverage [email or web address] Password [email or web address] 12/06/2012, 9:57 PM

## 2012-12-06 NOTE — ED Provider Notes (Signed)
CSN: [id number]     Arrival date & time 12/06/12  1537 History   First MD Initiated Contact with Patient 12/06/12 [phone]     Chief Complaint  Patient presents with  . Nausea    x 1 episode   (Consider location/radiation/quality/duration/timing/severity/associated sxs/prior Treatment) Patient is a 62 y.o. male presenting with vomiting.  Emesis Severity:  Mild Timing:  Rare Number of daily episodes:  1 Quality:  Stomach contents Progression:  Resolved Chronicity:  New Recent urination:  Normal Relieved by:  Nothing Worsened by:  Nothing tried Ineffective treatments:  None tried Associated symptoms: no abdominal pain, no arthralgias, no cough, no diarrhea, no fever, no headaches, no myalgias, no sore throat and no URI   Risk factors: diabetes   Risk factors: no prior abdominal surgery and no suspect food intake     Past Medical History  Diagnosis Date  . Diabetes mellitus without complication   . CHF (congestive heart failure)   . Hypertension   . Cardiomyopathy, dilated   . Small bowel obstruction 10/2012  . COPD (chronic obstructive pulmonary disease)   . Herpes     BUTT AND BACK- 9/23 HEALED  . Atrial flutter by electrocardiogram 11/2012   Past Surgical History  Procedure Laterality Date  . Laparoscopic incisional / umbilical / ventral hernia repair     Family History  Problem Relation Age of Onset  . Cancer Neg Hx    History  Substance Use Topics  . Smoking status: Former Smoker -- 0.25 packs/day for .5 years    Types: Cigarettes    Quit date: 09/28/2012  . Smokeless tobacco: Never Used  . Alcohol Use: No    Review of Systems  Constitutional: Negative for fever, activity change and appetite change.  HENT: Negative for sore throat.   Respiratory: Negative for cough, shortness of breath and wheezing.   Cardiovascular: Negative for chest pain and palpitations.  Gastrointestinal: Positive for vomiting. Negative for nausea, abdominal pain, diarrhea and  constipation.  Genitourinary: Negative for dysuria and difficulty urinating.  Musculoskeletal: Negative for myalgias and arthralgias.  Skin: Negative for color change, pallor, rash and wound.  Neurological: Negative for headaches.  All other systems reviewed and are negative.    Allergies  Other  Home Medications   No current outpatient prescriptions on file. BP 100/43  Pulse 77  Temp(Src) 98.3 F (36.8 C) (Oral)  Resp 18  Ht 6\' 1"  (1.854 m)  Wt 175 lb 7.8 oz (79.6 kg)  BMI 23.16 kg/m2  SpO2 100% Physical Exam  Nursing note and vitals reviewed. Constitutional: He is oriented to person, place, and time. He appears well-developed and well-nourished. No distress.  Pleasant male, chronically ill appearing, in NAD  HENT:  Head: Normocephalic and atraumatic.  Mouth/Throat: Oropharynx is clear and moist. No oropharyngeal exudate.  Eyes: Conjunctivae and EOM are normal. Pupils are equal, round, and reactive to light.  Neck: Normal range of motion. Neck supple.  Cardiovascular: Normal rate, regular rhythm, normal heart sounds and intact distal pulses.  Exam reveals no gallop and no friction rub.   No murmur heard. Pulmonary/Chest: Effort normal and breath sounds normal. No respiratory distress. He has no wheezes. He has no rales.  Abdominal: Soft. He exhibits no distension and no mass. There is no tenderness. There is no rebound and no guarding.  Musculoskeletal: Normal range of motion. He exhibits no edema and no tenderness.  Lymphadenopathy:    He has no cervical adenopathy.  Neurological: He is alert and oriented  to person, place, and time. No cranial nerve deficit.  Skin: Skin is warm and dry. No rash noted. He is not diaphoretic.  Psychiatric: He has a normal mood and affect. His behavior is normal. Judgment and thought content normal.    ED Course  Procedures (including critical care time) Labs Review Labs Reviewed  CBC WITH DIFFERENTIAL - Abnormal; Notable for the  following:    Hemoglobin 10.9 (*)    HCT 32.7 (*)    MCV 76.4 (*)    MCH 25.5 (*)    RDW 19.4 (*)    Platelets 139 (*)    Neutrophils Relative % 42 (*)    Eosinophils Relative 18 (*)    Eosinophils Absolute 1.1 (*)    All other components within normal limits  BASIC METABOLIC PANEL - Abnormal; Notable for the following:    Potassium 5.2 (*)    CO2 33 (*)    BUN 29 (*)    Creatinine, Ser 1.42 (*)    Calcium 7.4 (*)    GFR calc non Af Amer 51 (*)    GFR calc Af Amer 60 (*)    All other components within normal limits  PRO B NATRIURETIC PEPTIDE - Abnormal; Notable for the following:    Pro B Natriuretic peptide (BNP) 16524.0 (*)    All other components within normal limits  URINALYSIS, ROUTINE W REFLEX MICROSCOPIC - Abnormal; Notable for the following:    APPearance CLOUDY (*)    Hgb urine dipstick MODERATE (*)    Leukocytes, UA TRACE (*)    All other components within normal limits  URINE MICROSCOPIC-ADD ON - Abnormal; Notable for the following:    Squamous Epithelial / LPF FEW (*)    Bacteria, UA FEW (*)    Casts GRANULAR CAST (*)    All other components within normal limits  DIGOXIN LEVEL - Abnormal; Notable for the following:    Digoxin Level 0.4 (*)    All other components within normal limits  HEPATIC FUNCTION PANEL - Abnormal; Notable for the following:    Albumin 1.7 (*)    Alkaline Phosphatase 128 (*)    All other components within normal limits  URINE CULTURE  MRSA PCR SCREENING  GLUCOSE, CAPILLARY  POTASSIUM  GLUCOSE, CAPILLARY  BASIC METABOLIC PANEL  CBC   Imaging Review Dg Chest 2 View  12/06/2012   CLINICAL DATA:  Nausea and vomiting for 1 day, past smoking history  EXAM: CHEST  2 VIEW  COMPARISON:  11/26/2012  FINDINGS: Mild to moderate cardiomegaly. Vascular pattern normal. Right diaphragm is elevated. There is suboptimal positioning on both the frontal and lateral views with incomplete visualization of the left costophrenic angle and retrocardiac  portion of the left lower lobe.  IMPRESSION: Study mildly limited by suboptimal positioning. No acute abnormalities. Right diaphragm elevation was not present on multiple prior chest radiographs but was observed on 10/08/2012 CT scan. Stable mild to moderate cardiac enlargement.   Electronically Signed   By: Esperanza Heir M.D.   On: 12/06/2012 17:58    MDM   1. Hypotensive episode   2. Emesis   3. Chronic CHF   4. Diabetes mellitus without complication   5. COPD (chronic obstructive pulmonary disease)   6. CKD (chronic kidney disease) stage 3, GFR 30-59 ml/min   7. Nausea and vomiting     Patient is a 62 year old male with a history of New York Heart Association class IV congestive heart failure, hypertension, COPD, chronic kidney disease presents  as a referral from his primary care doctor's office for one episode of emesis as well as unmeasured blood pressure. The patient was having a routine followup with his cardiologist, at which point he was instructed to take his medications on an empty stomach. He had one episode of emesis, after which his blood pressure was checked and they were unable to obtain a blood pressure. Of note, this is per patient report and no records from the office are available at this time.  On arrival patient is afebrile and hemodynamically stable with a blood pressure of 115 systolic. Denies chest pain, shortness of breath, nausea, constipation, diarrhea and also denies recent fevers, increasing dyspnea, worse in the swelling. Lungs are clear and exam is non-specific. Most likely is emesis related to medication with resultant intermittent hypotension. No signs of CHF exacerbation or over-diuresis. No chest pain and doubt ACS. Afebrile and no signs of pneumonia or UTI. Will get basic labs, CXR, UA, and gently hydrate.   Date: 12/06/2012  Rate: 72  Rhythm: normal sinus rhythm  QRS Axis: normal  Intervals: QT prolonged  ST/T Wave abnormalities: nonspecific ST changes   Conduction Disutrbances:right bundle branch block and left anterior fascicular block  Narrative Interpretation:   Old EKG Reviewed: unchanged  CXR with mild cardiomegaly but no consolidation, effusion, pneumothorax. Following hydration, patient remains hypotensive in the 80s systolic. No signs of infection. No leukocytosis or anemia. No signs of urinary tract infection. Digoxin level not elevated. No electrode abnormalities. Based on persistent hypertension as well as previous episode of emesis the patient's risk factors, hospitalist consult for admission the patient was admitted to the step down unit based on vital signs.  Patient was discussed with my attending, Dr. Jeraldine Loots.     Dorna Leitz, MD 12/07/12 [phone]

## 2012-12-06 NOTE — ED Notes (Signed)
Received report from off going RN and introduced self to the pt.  No new needs at this time but will continue to monitor  

## 2012-12-06 NOTE — ED Notes (Signed)
Patient at pcp office for follow up and states he took medications with nothing on his stomach and threw up x 1 episode, patient pcp sent him here for nausea c/o  As well as low bp, patient denies pain/sob, a&o x 4 at this time

## 2012-12-07 DIAGNOSIS — E119 Type 2 diabetes mellitus without complications: Secondary | ICD-10-CM

## 2012-12-07 DIAGNOSIS — I959 Hypotension, unspecified: Secondary | ICD-10-CM

## 2012-12-07 LAB — BASIC METABOLIC PANEL
CO2: 30 mEq/L (ref 19–32)
Calcium: 6.7 mg/dL — ABNORMAL LOW (ref 8.4–10.5)
Creatinine, Ser: 1.44 mg/dL — ABNORMAL HIGH (ref 0.50–1.35)
Glucose, Bld: 105 mg/dL — ABNORMAL HIGH (ref 70–99)
Potassium: 3.6 mEq/L (ref 3.5–5.1)
Sodium: 139 mEq/L (ref 135–145)

## 2012-12-07 LAB — CBC
Hemoglobin: 8.7 g/dL — ABNORMAL LOW (ref 13.0–17.0)
MCH: 25 pg — ABNORMAL LOW (ref 26.0–34.0)
MCV: 75.6 fL — ABNORMAL LOW (ref 78.0–100.0)
Platelets: 92 10*3/uL — ABNORMAL LOW (ref 150–400)
RBC: 3.48 MIL/uL — ABNORMAL LOW (ref 4.22–5.81)

## 2012-12-07 LAB — GLUCOSE, CAPILLARY
Glucose-Capillary: 193 mg/dL — ABNORMAL HIGH (ref 70–99)
Glucose-Capillary: 99 mg/dL (ref 70–99)
Glucose-Capillary: 118 mg/dL — ABNORMAL HIGH (ref 70–99)

## 2012-12-07 LAB — CREATININE, URINE, RANDOM: Creatinine, Urine: 75.1 mg/dL

## 2012-12-07 LAB — SODIUM, URINE, RANDOM: Sodium, Ur: 18 mEq/L

## 2012-12-07 MED ORDER — SODIUM CHLORIDE 0.9 % IV BOLUS (SEPSIS)
500.0000 mL | Freq: Once | INTRAVENOUS | Status: AC
Start: 2012-12-07 — End: 2012-12-07
  Administered 2012-12-07: 500 mL via INTRAVENOUS

## 2012-12-07 MED ORDER — SODIUM CHLORIDE 0.9 % IV BOLUS (SEPSIS)
500.0000 mL | Freq: Once | INTRAVENOUS | Status: AC
  Administered 2012-12-07: 500 mL via INTRAVENOUS

## 2012-12-07 NOTE — Progress Notes (Signed)
[facility] PROGRESS NOTE Assessment/Plan: Nausea and vomiting/Hypotension: - Bidil and diuretics held on admission. - Responded in the ED to NS bolus, now BP borderline low, repeat.  - Most likely over diuresis, his creatinine trended from 1.1 -> 1.4 on admission. HCO has increased. - check Orthostatics, Urinary Na. - check a b-met in am. Hold coreg for 24 hrs  Atrial flutter by electrocardiogram - on apixaban - closely monitor rate.  - rate control.    CKD (chronic kidney disease) stage 3, GFR 30-59 ml/min: - worsen due to over diuresis. - start IV fluids, strict I and O's. - b- met in am.  Diabetes mellitus without complication - stable, cont SSI.  Code Status: Full code.  Family Communication: None.  Disposition Plan: Admit to inpatient.    Consultants:  none  Procedures:  none  Antibiotics:  None  HPI/Subjective: Feels better. Was orthostatic on admission.  Objective: Filed Vitals:   12/06/12 2200 12/06/12 2254 12/07/12 0440 12/07/12 0937  BP: 92/57 100/43 103/57 82/42  Pulse: 62 77 77 70  Temp:  98.3 F (36.8 C) 99 F (37.2 C) 98.8 F (37.1 C)  TempSrc:  Oral Oral Oral  Resp: 15 18 18 20   Height:  6\' 1"  (1.854 m)    Weight:  79.6 kg (175 lb 7.8 oz) 79.606 kg (175 lb 8 oz)   SpO2: 97% 100% 100% 97%    Intake/Output Summary (Last 24 hours) at 12/07/12 1044 Last data filed at 12/07/12 0946  Gross per 24 hour  Intake    723 ml  Output    520 ml  Net    203 ml   Filed Weights   12/06/12 2254 12/07/12 0440  Weight: 79.6 kg (175 lb 7.8 oz) 79.606 kg (175 lb 8 oz)    Exam:  General: Alert, awake, oriented x3, in no acute distress.  HEENT: No bruits, no goiter.  Heart: Regular rate and rhythm, without murmurs, rubs, gallops.  Lungs: Good air movement, clear to auscultation. Abdomen: Soft, nontender, nondistended, positive bowel sounds.  Neuro: Grossly intact, nonfocal.   Data Reviewed: Basic Metabolic Panel:  Recent Labs Lab  12/01/12 0600 12/06/12 1617 12/06/12 1807 12/07/12 0747  NA 143 139  --  139  K 3.2* 5.2* 4.0 3.6  CL 110 96  --  98  CO2 25 33*  --  30  GLUCOSE 75 82  --  105*  BUN 22 29*  --  34*  CREATININE 1.18 1.42*  --  1.44*  CALCIUM 8.1* 7.4*  --  6.7*   Liver Function Tests:  Recent Labs Lab 12/06/12 2126  AST 25  ALT 24  ALKPHOS 128*  BILITOT 0.7  PROT 6.3  ALBUMIN 1.7*   No results found for this basename: LIPASE, AMYLASE,  in the last 168 hours No results found for this basename: AMMONIA,  in the last 168 hours CBC:  Recent Labs Lab 12/01/12 0600 12/06/12 1617 12/07/12 0747  WBC 6.7 6.2 5.9  NEUTROABS  --  2.6  --   HGB 9.9* 10.9* 8.7*  HCT 29.3* 32.7* 26.3*  MCV 75.1* 76.4* 75.6*  PLT 71* 139* 92*   Cardiac Enzymes: No results found for this basename: CKTOTAL, CKMB, CKMBINDEX, TROPONINI,  in the last 168 hours BNP (last 3 results)  Recent Labs  10/06/12 2305 12/02/12 1610 12/06/12 1618  PROBNP 33980.0* 69963.0* 16524.0*   CBG:  Recent Labs Lab 12/02/12 1123 12/02/12 1628 12/06/12 1716 12/06/12 2251 12/07/12 0643  GLUCAP 90  102* 77 84 88    Recent Results (from the past 240 hour(s))  CLOSTRIDIUM DIFFICILE BY PCR     Status: None   Collection Time    11/30/12  1:43 AM      Result Value Range Status   C difficile by pcr NEGATIVE  NEGATIVE Final  MRSA PCR SCREENING     Status: None   Collection Time    12/06/12 10:43 PM      Result Value Range Status   MRSA by PCR NEGATIVE  NEGATIVE Final   Comment:            The GeneXpert MRSA Assay (FDA     approved for NASAL specimens     only), is one component of a     comprehensive MRSA colonization     surveillance program. It is not     intended to diagnose MRSA     infection nor to guide or     monitor treatment for     MRSA infections.     Studies: Dg Chest 2 View  12/06/2012   CLINICAL DATA:  Nausea and vomiting for 1 day, past smoking history  EXAM: CHEST  2 VIEW  COMPARISON:  11/26/2012   FINDINGS: Mild to moderate cardiomegaly. Vascular pattern normal. Right diaphragm is elevated. There is suboptimal positioning on both the frontal and lateral views with incomplete visualization of the left costophrenic angle and retrocardiac portion of the left lower lobe.  IMPRESSION: Study mildly limited by suboptimal positioning. No acute abnormalities. Right diaphragm elevation was not present on multiple prior chest radiographs but was observed on 10/08/2012 CT scan. Stable mild to moderate cardiac enlargement.   Electronically Signed   By: Esperanza Heir M.D.   On: 12/06/2012 17:58    Scheduled Meds: . albuterol  2.5 mg Nebulization QID   And  . ipratropium  0.5 mg Nebulization QID  . allopurinol  100 mg Oral Daily  . amiodarone  200 mg Oral Daily  . apixaban  5 mg Oral Q12H  . aspirin EC  81 mg Oral Daily  . carvedilol  3.125 mg Oral BID WC  . colchicine  0.6 mg Oral Daily  . feeding supplement (PRO-STAT SUGAR FREE 64)  30 mL Oral TID WC  . insulin aspart  0-9 Units Subcutaneous TID WC  . mirtazapine  7.5 mg Oral QHS  . multivitamin with minerals  2 tablet Oral Daily  . pantoprazole  40 mg Oral Daily  . sodium chloride  3 mL Intravenous Q12H  . sodium chloride  3 mL Intravenous Q12H   Continuous Infusions:    Marinda Elk  [facility] Pager [phone]. If 8PM-8AM, please contact night-coverage at [email or web address], password [facility] 12/07/2012, 10:44 AM  LOS: 1 day

## 2012-12-07 NOTE — Progress Notes (Signed)
Utilization Review Completed Derrill Bagnell J. Kyia Rhude, RN, BSN, NCM [phone]  

## 2012-12-07 NOTE — Clinical Documentation Improvement (Signed)
THIS DOCUMENT IS NOT A PERMANENT PART OF THE MEDICAL RECORD  Please update your documentation with the medical record to reflect your response to this query. If you need help knowing how to do this please call [phone].  12/07/12  Dr. David Stall,  In a better effort to capture your patient's severity of illness, reflect appropriate length of stay and utilization of resources, a review of the patient medical record has revealed the following information:   - Known history of CHF per H&P with EF of 20-25%; unable to rule out apical thrombus, right ventricle moderately dilated by Echo 09/13/12    - < 1 week ago admission for Acute on Chronic Systolic and Diastolic Heart Failure" per [facility] review    Based on your clinical judgment, please document the Acuity and Type of Heart Failure monitored and treated this admission:  ACUITY:  - Acute  - Chronic  - Acute on Chronic  AND  TYPE:  - Systolic  - Diastolic  - Combined   In responding to this query please exercise your independent judgment.     The fact that a query is asked, does not imply that any particular answer is desired or expected.   Reviewed: 12/08/12 - Systolic heart failure: -chronic; currently compensated" documented by Dr. Gwenlyn Perking in pn 12/08/12.  CS   Thank Hildred Laser   [phone] Health Information Management [place]

## 2012-12-07 NOTE — ED Provider Notes (Signed)
This patient was seen in conjunction with the Resident Physician, Dr. Durward Fortes.  I have seen the relevant studies (and ECG as performed) and agree with the interpretation.  The documentation is accurate, and reflects my interpretation of the encounter with the following addition (s).  This patient presents from his physician's office after an episode of vomiting.  Most notably, the patient was hypotensive.  Vital signs improved while here, but after resuscitation, the patient had additional episodes of hypotension.  Throughout the patient's emergency department stay, he remained cognitively appropriate, in no distress, but he was admitted for further evaluation and management given his multiple medical problems, his recurrent hypotension.  Gerhard Munch, MD 12/07/12 0030

## 2012-12-08 DIAGNOSIS — I4892 Unspecified atrial flutter: Secondary | ICD-10-CM

## 2012-12-08 DIAGNOSIS — N179 Acute kidney failure, unspecified: Secondary | ICD-10-CM

## 2012-12-08 DIAGNOSIS — N390 Urinary tract infection, site not specified: Secondary | ICD-10-CM

## 2012-12-08 LAB — GLUCOSE, CAPILLARY
Glucose-Capillary: 85 mg/dL (ref 70–99)
Glucose-Capillary: 102 mg/dL — ABNORMAL HIGH (ref 70–99)

## 2012-12-08 MED ORDER — SODIUM CHLORIDE 0.9 % IV SOLN
INTRAVENOUS | Status: AC
  Administered 2012-12-08: 16:00:00 via INTRAVENOUS

## 2012-12-08 MED ORDER — CIPROFLOXACIN HCL 250 MG PO TABS
250.0000 mg | ORAL_TABLET | Freq: Two times a day (BID) | ORAL | Status: DC
  Administered 2012-12-08 – 2012-12-09 (×2): 250 mg via ORAL
  Filled 2012-12-08 (×4): qty 1

## 2012-12-08 NOTE — Progress Notes (Addendum)
[facility] PROGRESS NOTE Assessment/Plan: Nausea and vomiting/Hypotension: - Bidil and diuretics held on admission. - Responded in the ED to NS bolus; but BP remains borderline low and with positive orthostatic changes -will check cortisol -repeat OVS in am -N/V resolved  Atrial flutter by electrocardiogram -rate control -on apixaban - closely monitor rate.  -continue amiodarone    Acute on CKD (chronic kidney disease) stage 3, GFR 30-59 ml/min: -due to dehydration, hypotension, over diuresis and UTI -IV fluids given on admission. Now tolerating PO intake well -repeat BMET in am -will hold antihypertensive and diuretic drugs for now.  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  GERD -continue PPI  UTI -gram neg rods, > 100,000 colonies -probably infection not clear during las admission -will use cipro BID  Hx of Gout: -no acute exacerbation -continue colchicine  Systolic heart failure:  -chronic; currently compensated -EF 20-25% (last 2-D echo on July 2014) -holding diuretics and ACE given renal failure -B-blocker on hold due to hypotension  Hyperkalemia: -Resolved -will monitor -most likely due to ARF  Code Status: Full code.  Family Communication: None.  Disposition Plan: Admit to inpatient.    Consultants:  none  Procedures:  none  Antibiotics:  None  HPI/Subjective: Feels better. But still orthostatic.  Objective: Filed Vitals:   12/08/12 0850 12/08/12 0905 12/08/12 0915 12/08/12 1324  BP: 107/61 86/60 96/51  97/53  Pulse: 72   67  Temp: 97.7 F (36.5 C)   98.4 F (36.9 C)  TempSrc: Oral   Oral  Resp:      Height:      Weight:      SpO2: 100%   100%    Intake/Output Summary (Last 24 hours) at 12/08/12 1517 Last data filed at 12/08/12 1356  Gross per 24 hour  Intake   1443 ml  Output   1525 ml  Net    -82 ml   Filed Weights   12/06/12 2254 12/07/12 0440  12/08/12 0600  Weight: 79.6 kg (175 lb 7.8 oz) 79.606 kg (175 lb 8 oz) 81.7 kg (180 lb 1.9 oz)    Exam:  General: Alert, awake, oriented x3, in no acute distress.  HEENT: No bruits, no goiter.  Heart: Regular rate, without murmurs, rubs, gallops. No JVD Lungs: Good air movement, clear to auscultation. Abdomen: Soft, nontender, nondistended, positive bowel sounds.  Extremities: no edema Neuro: Grossly intact, nonfocal.   Data Reviewed: Basic Metabolic Panel:  Recent Labs Lab 12/06/12 1617 12/06/12 1807 12/07/12 0747  NA 139  --  139  K 5.2* 4.0 3.6  CL 96  --  98  CO2 33*  --  30  GLUCOSE 82  --  105*  BUN 29*  --  34*  CREATININE 1.42*  --  1.44*  CALCIUM 7.4*  --  6.7*   Liver Function Tests:  Recent Labs Lab 12/06/12 2126  AST 25  ALT 24  ALKPHOS 128*  BILITOT 0.7  PROT 6.3  ALBUMIN 1.7*   CBC:  Recent Labs Lab 12/06/12 1617 12/07/12 0747  WBC 6.2 5.9  NEUTROABS 2.6  --   HGB 10.9* 8.7*  HCT 32.7* 26.3*  MCV 76.4* 75.6*  PLT 139* 92*   BNP (last 3 results)  Recent Labs  10/06/12 2305 12/02/12 1610 12/06/12 1618  PROBNP 33980.0* 69963.0* 16524.0*   CBG:  Recent Labs Lab 12/07/12 1123 12/07/12 1608 12/07/12 2103 12/08/12 0623 12/08/12 1111  GLUCAP 193* 99 118* 85 102*  Recent Results (from the past 240 hour(s))  CLOSTRIDIUM DIFFICILE BY PCR     Status: None   Collection Time    11/30/12  1:43 AM      Result Value Range Status   C difficile by pcr NEGATIVE  NEGATIVE Final  URINE CULTURE     Status: None   Collection Time    12/06/12  7:34 PM      Result Value Range Status   Specimen Description URINE, CLEAN CATCH   Final   Special Requests NONE   Final   Culture  Setup Time     Final   Value: 12/07/2012 01:08     Performed at [facility] Count     Final   Value: 80,000 COLONIES/ML     Performed at [facility]   Culture     Final   Value: GRAM NEGATIVE RODS     Performed at [facility]   Report Status PENDING   Incomplete  MRSA PCR SCREENING     Status: None   Collection Time    12/06/12 10:43 PM      Result Value Range Status   MRSA by PCR NEGATIVE  NEGATIVE Final   Comment:            The GeneXpert MRSA Assay (FDA     approved for NASAL specimens     only), is one component of a     comprehensive MRSA colonization     surveillance program. It is not     intended to diagnose MRSA     infection nor to guide or     monitor treatment for     MRSA infections.     Studies: Dg Chest 2 View  12/06/2012   CLINICAL DATA:  Nausea and vomiting for 1 day, past smoking history  EXAM: CHEST  2 VIEW  COMPARISON:  11/26/2012  FINDINGS: Mild to moderate cardiomegaly. Vascular pattern normal. Right diaphragm is elevated. There is suboptimal positioning on both the frontal and lateral views with incomplete visualization of the left costophrenic angle and retrocardiac portion of the left lower lobe.  IMPRESSION: Study mildly limited by suboptimal positioning. No acute abnormalities. Right diaphragm elevation was not present on multiple prior chest radiographs but was observed on 10/08/2012 CT scan. Stable mild to moderate cardiac enlargement.   Electronically Signed   By: Esperanza Heir M.D.   On: 12/06/2012 17:58    Scheduled Meds: . albuterol  2.5 mg Nebulization QID   And  . ipratropium  0.5 mg Nebulization QID  . allopurinol  100 mg Oral Daily  . amiodarone  200 mg Oral Daily  . apixaban  5 mg Oral Q12H  . aspirin EC  81 mg Oral Daily  . colchicine  0.6 mg Oral Daily  . feeding supplement (PRO-STAT SUGAR FREE 64)  30 mL Oral TID WC  . insulin aspart  0-9 Units Subcutaneous TID WC  . mirtazapine  7.5 mg Oral QHS  . multivitamin with minerals  2 tablet Oral Daily  . pantoprazole  40 mg Oral Daily  . sodium chloride  3 mL Intravenous Q12H  . sodium chloride  3 mL Intravenous Q12H   Continuous Infusions:    Caleb Bauer  [facility] Pager [phone]. If  8PM-8AM, please contact night-coverage at [email or web address], password [email or web address] 12/08/2012, 3:17 PM  LOS: 2 days

## 2012-12-09 DIAGNOSIS — R5381 Other malaise: Secondary | ICD-10-CM

## 2012-12-09 LAB — BASIC METABOLIC PANEL
Calcium: 6.8 mg/dL — ABNORMAL LOW (ref 8.4–10.5)
Chloride: 105 mEq/L (ref 96–112)
Creatinine, Ser: 1.02 mg/dL (ref 0.50–1.35)
GFR calc non Af Amer: 77 mL/min — ABNORMAL LOW (ref >90)
Glucose, Bld: 105 mg/dL — ABNORMAL HIGH (ref 70–99)
Potassium: 3.9 mEq/L (ref 3.5–5.1)
Sodium: 139 mEq/L (ref 135–145)

## 2012-12-09 LAB — CORTISOL: Cortisol, Plasma: 6.8 ug/dL

## 2012-12-09 LAB — GLUCOSE, CAPILLARY: Glucose-Capillary: 101 mg/dL — ABNORMAL HIGH (ref 70–99)

## 2012-12-09 MED ORDER — HYDROCODONE-ACETAMINOPHEN 5-325 MG PO TABS: 1.0000 | ORAL_TABLET | Freq: Four times a day (QID) | ORAL | Status: DC | PRN

## 2012-12-09 MED ORDER — FLUDROCORTISONE ACETATE 0.1 MG PO TABS
0.1000 mg | ORAL_TABLET | Freq: Every day | ORAL | Status: DC
  Administered 2012-12-09: 0.1 mg via ORAL
  Filled 2012-12-09 (×2): qty 1

## 2012-12-09 MED ORDER — LISINOPRIL 2.5 MG PO TABS: 2.5000 mg | ORAL_TABLET | Freq: Every day | ORAL | Status: DC

## 2012-12-09 MED ORDER — CIPROFLOXACIN HCL 250 MG PO TABS: 250.0000 mg | ORAL_TABLET | Freq: Two times a day (BID) | ORAL | Status: AC

## 2012-12-09 MED ORDER — TORSEMIDE 20 MG PO TABS: 20.0000 mg | ORAL_TABLET | Freq: Two times a day (BID) | ORAL | Status: DC

## 2012-12-09 MED ORDER — FLUDROCORTISONE ACETATE 0.1 MG PO TABS: 0.1000 mg | ORAL_TABLET | Freq: Every day | ORAL | Status: DC

## 2012-12-09 MED ORDER — ISOSORB DINITRATE-HYDRALAZINE 20-37.5 MG PO TABS: 1.0000 | ORAL_TABLET | Freq: Two times a day (BID) | ORAL | Status: DC

## 2012-12-09 NOTE — Progress Notes (Signed)
Okay per MD for d/c today back to [facility]. Patient is aware and agreeable. Notified SNF and nursing staff. CSW notified patients mother of discharge. EMS to transport. No further CSW needs identified. CSW signing off. Baruch Goldmann, BSW intern   I have reviewed approved above note.  Lorri Frederick. Crowder, LCSWA  [phone]

## 2012-12-09 NOTE — Discharge Summary (Signed)
Physician Discharge Summary  ERIKSON DANZY ZHY:[id number] DOB: 02-Jan-1951 DOA: 12/06/2012  PCP: Bufford Spikes, DO  Admit date: 12/06/2012 Discharge date: 12/09/2012  Time spent: >30 minutes  Recommendations for Outpatient Follow-up:  -Close Followup to his BMET to follow renal function and electrolytes -Patient will follow in one week with cardiology  -Low sodium diet and fluid restriction has been advised at discharge.  BNP    Component Value Date/Time   PROBNP 16524.0* 12/06/2012 1618   Filed Weights   12/07/12 0440 12/08/12 0600 12/09/12 0449  Weight: 79.606 kg (175 lb 8 oz) 81.7 kg (180 lb 1.9 oz) 82.736 kg (182 lb 6.4 oz)     Discharge Diagnoses:  Nausea and vomiting Diabetes mellitus without complication Orthostatic  Hypotension Acute on CKD (chronic kidney disease) stage 3, GFR 30-59 ml/min Atrial flutter/atrial fibrillation by electrocardiogram GERD Physical deconditioning Enterobacter UTI  Discharge Condition: Stable and improved. Will be discharged to skilled nursing facility [facility] leaving) for physical rehabilitation.  Diet recommendation: Heart healthy diet and low carbohydrates.   History of present illness:  62 y.o. male who was recently admitted for digoxin toxicity and at that time patient was found to be in cardiogenic shock and was placed on vasopressors had gone for routine followup to his cardiologist where he was found to have mild hypotension with nausea vomiting. Patient states that he has taken his medications in an empty stomach and felt nauseous in the cardiologist clinic. Denies any abdominal pain diarrhea. Medication list sure that he is being off digoxin now. Patient also was found to be hypotensive and in the ER he was still found to be hypotensive and was given 500 cc normal saline bolus after which his blood pressure improved to above 90 systolic. Presently patient is asymptomatic denies any chest pain shortness of breath abdominal pain. Digoxin  levels and LFTs are pending. Patient on exam does not have any abdominal tenderness. Patient will be admitted for further management.   Hospital Course:  Nausea and vomiting/Hypotension:  -Bidil dose has been adjusted to prevent hypotension. -Patient Responded to fluid resuscitation  -cortisol level within borderline normal range  -repeated OVS positive for orthostasis -Patient will be started on fludrocortisone  -N/V resolved   Atrial flutter/atrial fibrillation by electrocardiogram  -rate control  -on apixaban -continue amiodarone   Acute on CKD (chronic kidney disease) stage 3, GFR 30-59 ml/min:  -due to dehydration, hypotension, over diuresis and UTI  -IV fluids resuscitation was provided during this admission and his kidney function has returned to baseline. -Now tolerating PO intake well  -Will resume diuretics and ACE inhibitor at discharge -close follow up of his BMET in outpatient setting -bidil dose adjusted to help preventing hypotension.  -will finish treatment for UTI as mentioned below.   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  GERD  -continue PPI   Enterobacter UTI  - > 80,000 colonies  -probably infection not completely clear during last admission. -will use cipro BID and will treat for a total of 10 days. (8 days remaining at discharge)  Hx of Gout:  -no acute exacerbation  -continue colchicine   Chronic Systolic heart failure:  -chronic; currently compensated  -EF 20-25% (last 2-D echo on July 2014)  -will resume diuretics, low dose b-blocker and low dose ACE   -close follow up of  his BMET -further medication adjustment as per PCP and cardiology -BNP in the 16,000 range during this admission  -Weight at discharge 82 kg   Hyperkalemia:  -Resolved  -will  monitor  -most likely due to ARF  Physical deconditioning -Patient will be discharged to skilled nursing facility for further physical rehabilitation.  Procedures:  See below for x-ray reports  Consultations:  ID curbside (to decide on treatment of UTI)  Discharge Exam: Filed Vitals:   12/09/12 0610  BP: 119/77  Pulse: 99  Temp:   Resp: 18   General: Alert, awake, oriented x3, in no acute distress.  HEENT: No bruits, no goiter.  Heart: Regular rate, without murmurs, rubs, gallops. No JVD  Lungs: Good air movement, clear to auscultation.  Abdomen: Soft, nontender, nondistended, positive bowel sounds.  Extremities: no edema  Neuro: Grossly intact, nonfocal.   Discharge Instructions  Discharge Orders   Future Orders Complete By Expires   Diet - low sodium heart healthy  As directed    Discharge instructions  As directed    Comments:     Fluid restriction (1.5 L per day) Sodium restriction (2000 mg - 2500 mg of sodium in 24 hours) Continue followup with PCP as prior to admission arranged an also followup visit with cardiology service       Medication List    STOP taking these medications       cholecalciferol 1000 UNITS tablet  Commonly known as:  VITAMIN D      TAKE these medications       allopurinol 100 MG tablet  Commonly known as:  ZYLOPRIM  Take 1 tablet (100 mg total) by mouth daily.     amiodarone 200 MG tablet  Commonly known as:  PACERONE  Take 1 tablet (200 mg total) by mouth daily.     apixaban 5 MG Tabs tablet  Commonly known as:  ELIQUIS  Take 1 tablet (5 mg total) by mouth every 12 (twelve) hours.     aspirin EC 81 MG tablet  Take 81 mg by mouth daily.     carvedilol 3.125 MG tablet  Commonly known as:  COREG  Take 1 tablet (3.125 mg total) by mouth 2 (two) times daily with a meal.     ciprofloxacin 250 MG tablet  Commonly known as:  CIPRO  Take 1 tablet (250 mg total) by mouth 2 (two) times daily. Take medications daily twice a  day for 8 more days to complete antibiotic therapy treatment.     colchicine 0.6 MG tablet  Take 1 tablet (0.6 mg total) by mouth daily.     feeding supplement (PRO-STAT SUGAR FREE 64) Liqd  Take 30 mLs by mouth 3 (three) times daily with meals.     fludrocortisone 0.1 MG tablet  Commonly known as:  FLORINEF  Take 1 tablet (0.1 mg total) by mouth daily.     HYDROcodone-acetaminophen 5-325 MG per tablet  Commonly known as:  NORCO/VICODIN  Take 1 tablet by mouth every 6 (six) hours as needed for pain.     ipratropium-albuterol 0.5-2.5 (3) MG/3ML Soln  Commonly known as:  DUONEB  Take 3 mLs by nebulization 4 (four) times daily.     isosorbide-hydrALAZINE 20-37.5 MG per tablet  Commonly known as:  BIDIL  Take 1 tablet by mouth 2 (two) times daily.     lisinopril 2.5 MG tablet  Commonly known as:  ZESTRIL  Take 1 tablet (2.5 mg total) by mouth daily.  mirtazapine 7.5 MG tablet  Commonly known as:  REMERON  Take 7.5 mg by mouth at bedtime.     multivitamin with minerals Tabs tablet  Take 2 tablets by mouth daily.     omeprazole 40 MG capsule  Commonly known as:  PRILOSEC  Take 40 mg by mouth daily.     potassium chloride SA 20 MEQ tablet  Commonly known as:  K-DUR,KLOR-CON  Take 40 mEq by mouth daily.     torsemide 20 MG tablet  Commonly known as:  DEMADEX  Take 1 tablet (20 mg total) by mouth 2 (two) times daily.       Allergies  Allergen Reactions  . Other Nausea And Vomiting    Patient stated drug is "kerein"       Follow-up Information   Follow up with REED, TIFFANY, DO. Schedule an appointment as soon as possible for a visit in 5 days.   Specialty:  Geriatric Medicine   Contact information:   [address] [place] [address] [phone]        The results of significant diagnostics from this hospitalization (including imaging, microbiology, ancillary and laboratory) are listed below for reference.    Significant Diagnostic Studies: Dg Chest  2 View  12/06/2012   CLINICAL DATA:  Nausea and vomiting for 1 day, past smoking history  EXAM: CHEST  2 VIEW  COMPARISON:  11/26/2012  FINDINGS: Mild to moderate cardiomegaly. Vascular pattern normal. Right diaphragm is elevated. There is suboptimal positioning on both the frontal and lateral views with incomplete visualization of the left costophrenic angle and retrocardiac portion of the left lower lobe.  IMPRESSION: Study mildly limited by suboptimal positioning. No acute abnormalities. Right diaphragm elevation was not present on multiple prior chest radiographs but was observed on 10/08/2012 CT scan. Stable mild to moderate cardiac enlargement.   Electronically Signed   By: Esperanza Heir M.D.   On: 12/06/2012 17:58   US Renal  11/23/2012   CLINICAL DATA:  Renal failure.  EXAM: RENAL/URINARY TRACT ULTRASOUND COMPLETE  COMPARISON:  Renal ultrasound 10/07/2012 and CT abdomen and pelvis 10/08/2012.  FINDINGS: Right Kidney  Length: Measures 10.9 cm. There is some increase in cortical echogenicity. No stone, mass or hydronephrosis.  Left Kidney  Length: Measures 11.1 cm There is some increase in cortical echogenicity. No stone, mass or hydronephrosis.  Bladder: Bilateral ureteral jets are identified. No abnormality is seen.  IMPRESSION: Negative for hydronephrosis or acute abnormality.  Mildly increased cortical echogenicity compatible with medical renal disease.   Electronically Signed   By: Drusilla Kanner M.D.   On: 11/23/2012 23:14   Dg Chest Port 1 View  11/26/2012   CLINICAL DATA:  Central venous catheter placement  EXAM: PORTABLE CHEST - 1 VIEW  COMPARISON:  11/25/2012  FINDINGS: Right jugular catheter tip in the SVC. No pneumothorax  Cardiac enlargement with vascular congestion. Mild bibasilar atelectasis unchanged.  IMPRESSION: Satisfactory central venous catheter placement. Otherwise no change.   Electronically Signed   By: Marlan Palau M.D.   On: 11/26/2012 12:56   Dg Chest Port 1  View  11/25/2012   CLINICAL DATA:  Diabetes, hypertension, COPD, dilated cardiomyopathy ; digoxin toxicity, CHF, question pneumonia  EXAM: PORTABLE CHEST - 1 VIEW  COMPARISON:  Portable exam 1718 hr compared to 10/06/2012  FINDINGS: Enlargement of cardiac silhouette with pulmonary vascular congestion.  Tortuous aorta.  No acute failure or consolidation.  Significantly improved aeration at left base since previous exam.  No pleural  effusion or pneumothorax.  Bones unremarkable.  IMPRESSION: Enlargement of cardiac silhouette with pulmonary vascular congestion.  No acute abnormalities.   Electronically Signed   By: Ulyses Southward M.D.   On: 11/25/2012 17:28    Microbiology: Recent Results (from the past 240 hour(s))  CLOSTRIDIUM DIFFICILE BY PCR     Status: None   Collection Time    11/30/12  1:43 AM      Result Value Range Status   C difficile by pcr NEGATIVE  NEGATIVE Final  URINE CULTURE     Status: None   Collection Time    12/06/12  7:34 PM      Result Value Range Status   Specimen Description URINE, CLEAN CATCH   Final   Special Requests NONE   Final   Culture  Setup Time     Final   Value: 12/07/2012 01:08     Performed at [facility] Count     Final   Value: 80,000 COLONIES/ML     Performed at [facility]   Culture     Final   Value: ENTEROBACTER AEROGENES     Performed at [facility]   Report Status PENDING   Incomplete   Organism ID, Bacteria ENTEROBACTER AEROGENES   Final  MRSA PCR SCREENING     Status: None   Collection Time    12/06/12 10:43 PM      Result Value Range Status   MRSA by PCR NEGATIVE  NEGATIVE Final   Comment:            The GeneXpert MRSA Assay (FDA     approved for NASAL specimens     only), is one component of a     comprehensive MRSA colonization     surveillance program. It is not     intended to diagnose MRSA     infection nor to guide or     monitor treatment for     MRSA infections.     Labs: Basic  Metabolic Panel:  Recent Labs Lab 12/06/12 1617 12/06/12 1807 12/07/12 0747 12/09/12 0940  NA 139  --  139 139  K 5.2* 4.0 3.6 3.9  CL 96  --  98 105  CO2 33*  --  30 27  GLUCOSE 82  --  105* 105*  BUN 29*  --  34* 27*  CREATININE 1.42*  --  1.44* 1.02  CALCIUM 7.4*  --  6.7* 6.8*   Liver Function Tests:  Recent Labs Lab 12/06/12 2126  AST 25  ALT 24  ALKPHOS 128*  BILITOT 0.7  PROT 6.3  ALBUMIN 1.7*   CBC:  Recent Labs Lab 12/06/12 1617 12/07/12 0747  WBC 6.2 5.9  NEUTROABS 2.6  --   HGB 10.9* 8.7*  HCT 32.7* 26.3*  MCV 76.4* 75.6*  PLT 139* 92*   BNP: BNP (last 3 results)  Recent Labs  10/06/12 2305 12/02/12 1610 12/06/12 1618  PROBNP 33980.0* 69963.0* 16524.0*   CBG:  Recent Labs Lab 12/08/12 1111 12/08/12 1658 12/08/12 2146 12/09/12 0556 12/09/12 1111  GLUCAP 102* 121* 146* 101* 109*    Signed:  Aitanna Haubner  [facility] 12/09/2012, 1:36 PM

## 2012-12-09 NOTE — Progress Notes (Signed)
Pt had a 2lb wt gain since yesterday. Notified Dr. Gwenlyn Perking, no new orders given.

## 2012-12-10 ENCOUNTER — Other Ambulatory Visit: Payer: Self-pay | Admitting: Internal Medicine

## 2012-12-10 ENCOUNTER — Other Ambulatory Visit: Payer: Self-pay | Admitting: *Deleted

## 2012-12-10 ENCOUNTER — Non-Acute Institutional Stay (SKILLED_NURSING_FACILITY): Payer: Medicare Other | Admitting: Internal Medicine

## 2012-12-10 ENCOUNTER — Encounter: Payer: Self-pay | Admitting: Internal Medicine

## 2012-12-10 ENCOUNTER — Ambulatory Visit (HOSPITAL_COMMUNITY)
Admission: RE | Admit: 2012-12-10 | Discharge: 2012-12-10 | Disposition: A | Discharge status: Home / Self Care | Payer: Medicare Other | Source: Ambulatory Visit | Attending: Internal Medicine | Admitting: Internal Medicine

## 2012-12-10 ENCOUNTER — Encounter (HOSPITAL_COMMUNITY): Payer: Self-pay

## 2012-12-10 DIAGNOSIS — S0990XA Unspecified injury of head, initial encounter: Secondary | ICD-10-CM

## 2012-12-10 DIAGNOSIS — R296 Repeated falls: Secondary | ICD-10-CM | POA: Insufficient documentation

## 2012-12-10 DIAGNOSIS — E43 Unspecified severe protein-calorie malnutrition: Secondary | ICD-10-CM

## 2012-12-10 DIAGNOSIS — I959 Hypotension, unspecified: Secondary | ICD-10-CM

## 2012-12-10 DIAGNOSIS — I509 Heart failure, unspecified: Secondary | ICD-10-CM

## 2012-12-10 DIAGNOSIS — N183 Chronic kidney disease, stage 3 unspecified: Secondary | ICD-10-CM

## 2012-12-10 DIAGNOSIS — I4892 Unspecified atrial flutter: Secondary | ICD-10-CM

## 2012-12-10 DIAGNOSIS — I5043 Acute on chronic combined systolic (congestive) and diastolic (congestive) heart failure: Secondary | ICD-10-CM

## 2012-12-10 DIAGNOSIS — E119 Type 2 diabetes mellitus without complications: Secondary | ICD-10-CM

## 2012-12-10 DIAGNOSIS — N390 Urinary tract infection, site not specified: Secondary | ICD-10-CM

## 2012-12-10 DIAGNOSIS — S01112A Laceration without foreign body of left eyelid and periocular area, initial encounter: Secondary | ICD-10-CM

## 2012-12-10 DIAGNOSIS — S0180XA Unspecified open wound of other part of head, initial encounter: Secondary | ICD-10-CM

## 2012-12-10 DIAGNOSIS — K219 Gastro-esophageal reflux disease without esophagitis: Secondary | ICD-10-CM

## 2012-12-10 MED ORDER — HYDROCODONE-ACETAMINOPHEN 5-325 MG PO TABS: ORAL_TABLET | ORAL | Status: DC

## 2012-12-10 NOTE — Progress Notes (Signed)
MRN: [id number] Name: Caleb Bauer  Sex: male Age: 62 y.o. DOB: April 26, 1950  PSC #: Ronni Rumble Facility/Room: 132B Level Of Care: SNF Provider: Merrilee Seashore D Emergency Contacts: Extended Emergency Contact Information Primary Emergency Contact: Simmons,Beatrice Address: [address] [place]          [place], [place] [address] [place] Home Phone: [phone] Relation: Mother Secondary Emergency Contact: Kathe Mariner [place] Mobile Phone: [phone] Relation: Niece  Code Status: FULL  Allergies: Other  Chief Complaint  Patient presents with  . nursing home admission    HPI: Patient is 62 y.o. male who was admitted to hospital when he was found to be hypotensive at a routine cardiology appointment.  Past Medical History  Diagnosis Date  . Diabetes mellitus without complication   . CHF (congestive heart failure)   . Hypertension   . Cardiomyopathy, dilated   . Small bowel obstruction 10/2012  . COPD (chronic obstructive pulmonary disease)   . Herpes     BUTT AND BACK- 9/23 HEALED  . Atrial flutter by electrocardiogram 11/2012  . GERD (gastroesophageal reflux disease)     Past Surgical History  Procedure Laterality Date  . Laparoscopic incisional / umbilical / ventral hernia repair        Medication List       This list is accurate as of: 12/10/12 11:04 AM.  Always use your most recent med list.               allopurinol 100 MG tablet  Commonly known as:  ZYLOPRIM  Take 1 tablet (100 mg total) by mouth daily.     amiodarone 200 MG tablet  Commonly known as:  PACERONE  Take 1 tablet (200 mg total) by mouth daily.     apixaban 5 MG Tabs tablet  Commonly known as:  ELIQUIS  Take 1 tablet (5 mg total) by mouth every 12 (twelve) hours.     aspirin EC 81 MG tablet  Take 81 mg by mouth daily.     carvedilol 3.125 MG tablet  Commonly known as:  COREG  Take 1 tablet (3.125 mg total) by mouth 2 (two) times daily with  a meal.     ciprofloxacin 250 MG tablet  Commonly known as:  CIPRO  Take 1 tablet (250 mg total) by mouth 2 (two) times daily. Take medications daily twice a day for 8 more days to complete antibiotic therapy treatment.     colchicine 0.6 MG tablet  Take 1 tablet (0.6 mg total) by mouth daily.     feeding supplement (PRO-STAT SUGAR FREE 64) Liqd  Take 30 mLs by mouth 3 (three) times daily with meals.     fludrocortisone 0.1 MG tablet  Commonly known as:  FLORINEF  Take 1 tablet (0.1 mg total) by mouth daily.     HYDROcodone-acetaminophen 5-325 MG per tablet  Commonly known as:  NORCO/VICODIN  Take one tablet by mouth every 6 hours as needed for pain     ipratropium-albuterol 0.5-2.5 (3) MG/3ML Soln  Commonly known as:  DUONEB  Take 3 mLs by nebulization 4 (four) times daily.     isosorbide-hydrALAZINE 20-37.5 MG per tablet  Commonly known as:  BIDIL  Take 1 tablet by mouth 2 (two) times daily.     lisinopril 2.5 MG tablet  Commonly known as:  ZESTRIL  Take 1 tablet (2.5 mg total) by mouth daily.     mirtazapine 7.5 MG tablet  Commonly known  as:  REMERON  Take 7.5 mg by mouth at bedtime.     multivitamin with minerals Tabs tablet  Take 2 tablets by mouth daily.     omeprazole 40 MG capsule  Commonly known as:  PRILOSEC  Take 40 mg by mouth daily.     potassium chloride SA 20 MEQ tablet  Commonly known as:  K-DUR,KLOR-CON  Take 40 mEq by mouth daily.     torsemide 20 MG tablet  Commonly known as:  DEMADEX  Take 1 tablet (20 mg total) by mouth 2 (two) times daily.        No orders of the defined types were placed in this encounter.    Immunization History  Administered Date(s) Administered  . Influenza,inj,Quad PF,36+ Mos 12/07/2012  . Pneumococcal Polysaccharide 12/07/2012    History  Substance Use Topics  . Smoking status: Former Smoker -- 0.25 packs/day for .5 years    Types: Cigarettes    Quit date: 09/28/2012  . Smokeless tobacco: Never Used  .  Alcohol Use: No    Family history is noncontributory    Review of Systems  DATA OBTAINED: from patient, nurse, medical record, family member GENERAL: Feels well no fevers, fatigue, appetite changes SKIN: No itching, rash; pt had laceration L eyebrow from falling yesterday morning EYES: No eye pain, redness, discharge EARS: No earache, tinnitus, change in hearing NOSE: No congestion, drainage or bleeding  MOUTH/THROAT: No mouth or tooth pain, No sore throat, No difficulty chewing or swallowing  RESPIRATORY: No cough, wheezing, SOB CARDIAC: No chest pain, palpitations, lower extremity edema  GI: No abdominal pain, No N/V/D or constipation, No heartburn or reflux  GU: No dysuria, frequency or urgency, or incontinence  MUSCULOSKELETAL: No unrelieved bone/joint pain NEUROLOGIC: No headache, dizziness or focal weakness; pt did slip and fall yesterday and hit his head;no LOC, no nausea then and no sx today PSYCHIATRIC: No overt anxiety or sadness. Sleeps well. No behavior issue.   Filed Vitals:   12/10/12 0957  BP: 127/66  Pulse: 66  Temp: 97.3 F (36.3 C)  Resp: 18    Physical Exam  GENERAL APPEARANCE: Alert, conversant. Appropriately groomed. No acute distress.  SKIN: No diaphoresis rash;7 mm lac L eyebrow, not bleedng, cleaned by me and examined thoutoughly HEAD: Normocephalic, atraumatic  EYES: Conjunctiva/lids clear. Pupils round, reactive. EOMs intact.  EARS: External exam WNL, canals clear. Hearing grossly normal.  NOSE: No deformity or discharge.  MOUTH/THROAT: Lips w/o lesions. Mouth and throat normal. Tongue moist, w/o lesion.  NECK: No thyroid tenderness, enlargement or nodule  RESPIRATORY: Breathing is even, unlabored. Lung sounds are clear   CARDIOVASCULAR: Heart RRR no murmurs, rubs or gallops. No peripheral edema.  GASTROINTESTINAL: Abdomen is soft, non-tender, not distended w/ normal bowel sounds. No mass, ventral or inguinal hernia.  GENITOURINARY: Bladder non  tender, not distended  MUSCULOSKELETAL: No abnormal joints or musculature NEUROLOGIC: Oriented X3. Cranial nerves 2-12 grossly intact. Moves all extremities no tremor. PSYCHIATRIC: Mood and affect appropriate to situation, no behavioral issues  Patient Active Problem List   Diagnosis Date Noted  . Protein-calorie malnutrition, severe 12/10/2012  . GERD (gastroesophageal reflux disease)   . Nausea and vomiting 12/06/2012  . Atrial flutter by electrocardiogram   . Physical deconditioning 11/30/2012  . Hypotension 11/25/2012  . Dehydration 11/25/2012  . CKD (chronic kidney disease) stage 3, GFR 30-59 ml/min 11/25/2012  . Diarrhea 11/25/2012  . UTI (lower urinary tract infection) 11/25/2012  . Cardiogenic shock 11/25/2012  . Digoxin toxicity  11/23/2012  . Acute renal failure 11/23/2012  . COPD (chronic obstructive pulmonary disease)   . History of ventricular tachycardia 09/17/2012  . Abnormal transaminases 09/17/2012  . Thrombocytopenia, unspecified 09/11/2012  . Acute on chronic combined systolic and diastolic congestive heart failure, NYHA class 4   . Hypertension   . Diabetes mellitus without complication     CBC    Component Value Date/Time   WBC 5.9 12/07/2012 0747   RBC 3.48* 12/07/2012 0747   HGB 8.7* 12/07/2012 0747   HCT 26.3* 12/07/2012 0747   PLT 92* 12/07/2012 0747   MCV 75.6* 12/07/2012 0747   LYMPHSABS 1.7 12/06/2012 1617   MONOABS 0.7 12/06/2012 1617   EOSABS 1.1* 12/06/2012 1617   BASOSABS 0.0 12/06/2012 1617    CMP     Component Value Date/Time   NA 139 12/09/2012 0940   K 3.9 12/09/2012 0940   CL 105 12/09/2012 0940   CO2 27 12/09/2012 0940   GLUCOSE 105* 12/09/2012 0940   BUN 27* 12/09/2012 0940   CREATININE 1.02 12/09/2012 0940   CALCIUM 6.8* 12/09/2012 0940   PROT 6.3 12/06/2012 2126   ALBUMIN 1.7* 12/06/2012 2126   AST 25 12/06/2012 2126   ALT 24 12/06/2012 2126   ALKPHOS 128* 12/06/2012 2126   BILITOT 0.7 12/06/2012 2126   GFRNONAA 77* 12/09/2012 0940   GFRAA  89* 12/09/2012 0940    Assessment and Plan  Hypotension Multifactoral from dehydration from nausea and vomiting, over diuresis, UTI; Bidil was adjusted down to prevent hypotention and pt was started on fludrocortisone because cortisol levels were borderline so will continue these meds and doses  Atrial flutter by electrocardiogram On amiodarone and coreg for rate control;Eliquis for anticoagulation  CKD (chronic kidney disease) stage 3, GFR 30-59 ml/min Resolved bidil dose was adjusted;diuretics and ace have been restarted on admission to SNF; pt was scheduled already for BMP weekly-will schedule for biweekly now  Diabetes mellitus without complication Continues stable on diet;HbA1c 6.2 in July 2014.  UTI (lower urinary tract infection) Enterobacter >80,000; Was treated in hospital with IV abx; on cipro here from 10/9-last dose 10/17  GERD (gastroesophageal reflux disease) Continue omeprazole  Acute on chronic combined systolic and diastolic congestive heart failure, NYHA class 4 Diuretics will be resumed, low dose B blocker and low dose ACE; dry weight is 82 kgs; again will doe BMP biweekly now instead of weekly  Protein-calorie malnutrition, severe 10/6 albumin was 1.7; pt is eating again and has supplements  FALL WITH HIT TO HEAD - #1 pt is on eliquis so needs a CT scan -accomplishing that as an outpt; #2 laceration L eyebrow now > 24 hours old- steri strip which is a good option even within the time frame  Margit Hanks, MD

## 2012-12-10 NOTE — Assessment & Plan Note (Signed)
Continues stable on diet;HbA1c 6.2 in July 2014.

## 2012-12-10 NOTE — Assessment & Plan Note (Signed)
Continue omeprazole 

## 2012-12-10 NOTE — Assessment & Plan Note (Signed)
Diuretics will be resumed, low dose B blocker and low dose ACE; dry weight is 82 kgs; again will doe BMP biweekly now instead of weekly

## 2012-12-10 NOTE — Assessment & Plan Note (Signed)
10/6 albumin was 1.7; pt is eating again and has supplements

## 2012-12-10 NOTE — Assessment & Plan Note (Signed)
Enterobacter >80,000; Was treated in hospital with IV abx; on cipro here from 10/9-last dose 10/17

## 2012-12-10 NOTE — Assessment & Plan Note (Signed)
On amiodarone and coreg for rate control;Eliquis for anticoagulation

## 2012-12-10 NOTE — Assessment & Plan Note (Signed)
Multifactoral from dehydration from nausea and vomiting, over diuresis, UTI; Bidil was adjusted down to prevent hypotention and pt was started on fludrocortisone because cortisol levels were borderline so will continue these meds and doses

## 2012-12-10 NOTE — Assessment & Plan Note (Signed)
Resolved bidil dose was adjusted;diuretics and ace have been restarted on admission to SNF; pt was scheduled already for BMP weekly-will schedule for biweekly now

## 2012-12-11 LAB — URINE CULTURE: Colony Count: 80000

## 2012-12-14 ENCOUNTER — Non-Acute Institutional Stay (SKILLED_NURSING_FACILITY): Payer: Medicare Other | Admitting: Internal Medicine

## 2012-12-14 ENCOUNTER — Encounter: Payer: Self-pay | Admitting: Internal Medicine

## 2012-12-14 DIAGNOSIS — I509 Heart failure, unspecified: Secondary | ICD-10-CM

## 2012-12-14 DIAGNOSIS — I959 Hypotension, unspecified: Secondary | ICD-10-CM

## 2012-12-14 DIAGNOSIS — D509 Iron deficiency anemia, unspecified: Secondary | ICD-10-CM

## 2012-12-14 DIAGNOSIS — D696 Thrombocytopenia, unspecified: Secondary | ICD-10-CM

## 2012-12-14 DIAGNOSIS — I5043 Acute on chronic combined systolic (congestive) and diastolic (congestive) heart failure: Secondary | ICD-10-CM

## 2012-12-14 NOTE — Assessment & Plan Note (Addendum)
along with anemia of chronic dx - start ferrous sulfate 325 po BID

## 2012-12-14 NOTE — Assessment & Plan Note (Signed)
Pt has been having dizzy spells after taking BP meds and today pt was dizzy before meds and his SBP was 70. Pt is not having any CP,SOB, palpatations. Improves when he lays down and resolves after an hour or so.   Multiple changes are being made after much consideration #1 D/C Bidil and cut the nitro component in half so ----isosorbide 10 mg po BID #2 Demedex 20 mg daily ,  Down from BID  Prn order if >3 pound weight gain in a day #3 continue Coreg 6.25 but make it q 12 hr instead of BID #4  Increase fliorinef to 0.2 mg daily and start today #5 Hold BP and fluid meds for SBP < 100

## 2012-12-14 NOTE — Assessment & Plan Note (Signed)
Stable-BUN/Cr from yesterday was 26/1.2

## 2012-12-14 NOTE — Assessment & Plan Note (Signed)
PLT from yesterday 155

## 2012-12-14 NOTE — Assessment & Plan Note (Signed)
All med changes made for hypotension were made with full knowledge that pt's CHF still needs treatment

## 2012-12-14 NOTE — Progress Notes (Signed)
MRN: [id number] Name: Caleb [facility]  Sex: male Age: 62 y.o. DOB: [birth year]  PSC #: Ronni Rumble Facility/Room:  Level Of Care: SNF Provider: Merrilee Seashore D Emergency Contacts: Extended Emergency Contact Information Primary Emergency Contact: Simmons,Beatrice Address: [address]          [place], [place] [address] D[place] of [place] Home Phone: [phone] Relation: Mother Secondary Emergency Contact: Kathe Mariner [place] Mobile Phone: [phone] Relation: Niece  Code Status:   Allergies: Other  Chief Complaint  Patient presents with  . Acute Visit    HPI: Patient is 62 y.o. male whose SBP today was found to be 70. Episode improved with laying down. Pt admits dizzy spells after he takes certain medications.No CP or SOB or palpatations.  Past Medical History  Diagnosis Date  . Diabetes mellitus without complication   . CHF (congestive heart failure)   . Hypertension   . Cardiomyopathy, dilated   . Small bowel obstruction 10/2012  . COPD (chronic obstructive pulmonary disease)   . Herpes     BUTT AND BACK- 9/23 HEALED  . Atrial flutter by electrocardiogram 11/2012  . GERD (gastroesophageal reflux disease)     Past Surgical History  Procedure Laterality Date  . Laparoscopic incisional / umbilical / ventral hernia repair        Medication List       This list is accurate as of: 12/14/12 12:42 PM.  Always use your most recent med list.               allopurinol 100 MG tablet  Commonly known as:  ZYLOPRIM  Take 1 tablet (100 mg total) by mouth daily.     amiodarone 200 MG tablet  Commonly known as:  PACERONE  Take 1 tablet (200 mg total) by mouth daily.     apixaban 5 MG Tabs tablet  Commonly known as:  ELIQUIS  Take 1 tablet (5 mg total) by mouth every 12 (twelve) hours.     aspirin EC 81 MG tablet  Take 81 mg by mouth daily.     carvedilol 3.125 MG tablet  Commonly known as:  COREG  Take 1 tablet (3.125 mg  total) by mouth 2 (two) times daily with a meal.     ciprofloxacin 250 MG tablet  Commonly known as:  CIPRO  Take 1 tablet (250 mg total) by mouth 2 (two) times daily. Take medications daily twice a day for 8 more days to complete antibiotic therapy treatment.     colchicine 0.6 MG tablet  Take 1 tablet (0.6 mg total) by mouth daily.     feeding supplement (PRO-STAT SUGAR FREE 64) Liqd  Take 30 mLs by mouth 3 (three) times daily with meals.     fludrocortisone 0.1 MG tablet  Commonly known as:  FLORINEF  Take 1 tablet (0.1 mg total) by mouth daily.     HYDROcodone-acetaminophen 5-325 MG per tablet  Commonly known as:  NORCO/VICODIN  Take one tablet by mouth every 6 hours as needed for pain     ipratropium-albuterol 0.5-2.5 (3) MG/3ML Soln  Commonly known as:  DUONEB  Take 3 mLs by nebulization 4 (four) times daily.     isosorbide-hydrALAZINE 20-37.5 MG per tablet  Commonly known as:  BIDIL  Take 1 tablet by mouth 2 (two) times daily.     lisinopril 2.5 MG tablet  Commonly known as:  ZESTRIL  Take 1 tablet (2.5 mg total) by mouth daily.  mirtazapine 7.5 MG tablet  Commonly known as:  REMERON  Take 7.5 mg by mouth at bedtime.     multivitamin with minerals Tabs tablet  Take 2 tablets by mouth daily.     omeprazole 40 MG capsule  Commonly known as:  PRILOSEC  Take 40 mg by mouth daily.     potassium chloride SA 20 MEQ tablet  Commonly known as:  K-DUR,KLOR-CON  Take 40 mEq by mouth daily.     torsemide 20 MG tablet  Commonly known as:  DEMADEX  Take 1 tablet (20 mg total) by mouth 2 (two) times daily.        No orders of the defined types were placed in this encounter.    Immunization History  Administered Date(s) Administered  . Influenza,inj,Quad PF,36+ Mos 12/07/2012  . Pneumococcal Polysaccharide 12/07/2012    History  Substance Use Topics  . Smoking status: Former Smoker -- 0.25 packs/day for .5 years    Types: Cigarettes    Quit date:  09/28/2012  . Smokeless tobacco: Never Used  . Alcohol Use: No    Review of Systems  DATA OBTAINED: from patient, nurse GENERAL: Feels well no fevers, fatigue;, appetite is better , hates prostat SKIN: No itching, rash HEENT: No complaint RESPIRATORY: No cough, wheezing, SOB CARDIAC: No chest pain, palpitations, lower extremity edema  GI: No abdominal pain, No N/V/D or constipation, No heartburn or reflux  GU: No dysuria, frequency or urgency, or incontinence  MUSCULOSKELETAL: No unrelieved bone/joint pain NEUROLOGIC: No headache, dizziness or focal weakness PSYCHIATRIC: No overt anxiety or sadness. Sleeps well.   Filed Vitals:   12/14/12 1222  BP: 70/45  Pulse: 92    Physical Exam  GENERAL APPEARANCE: Alert, conversant. Appropriately groomed. No acute distress  SKIN: No diaphoresis rash, or wounds HEENT: Unremarkable RESPIRATORY: Breathing is even, unlabored. Lung sounds are clear   CARDIOVASCULAR: Heart RRR no murmurs, rubs or gallops. No peripheral edema  GASTROINTESTINAL: Abdomen is soft, non-tender, not distended w/ normal bowel sounds.  GENITOURINARY: Bladder non tender, not distended  MUSCULOSKELETAL: No abnormal joints or musculature NEUROLOGIC: Cranial nerves 2-12 grossly intact. Moves all extremities no tremor. PSYCHIATRIC: Mood and affect appropriate to situation, no behavioral issues  Patient Active Problem List   Diagnosis Date Noted  . Iron deficiency anemia 12/14/2012  . Protein-calorie malnutrition, severe 12/10/2012  . GERD (gastroesophageal reflux disease)   . Nausea and vomiting 12/06/2012  . Atrial flutter by electrocardiogram   . Physical deconditioning 11/30/2012  . Hypotension 11/25/2012  . Dehydration 11/25/2012  . CKD (chronic kidney disease) stage 3, GFR 30-59 ml/min 11/25/2012  . Diarrhea 11/25/2012  . UTI (lower urinary tract infection) 11/25/2012  . Cardiogenic shock 11/25/2012  . Digoxin toxicity 11/23/2012  . Acute renal failure  11/23/2012  . COPD (chronic obstructive pulmonary disease)   . History of ventricular tachycardia 09/17/2012  . Abnormal transaminases 09/17/2012  . Thrombocytopenia, unspecified 09/11/2012  . Acute on chronic combined systolic and diastolic congestive heart failure, NYHA class 4   . Hypertension   . Diabetes mellitus without complication     CBC 10/13  WBC  5.7,  8.9/27.8  plt 155   CMP 10/13  142, 3.9, 102, 34, 70, 26/1.2  cA 7.6   Assessment and Plan  Hypotension Pt has been having dizzy spells after taking BP meds and today pt was dizzy before meds and his SBP was 70. Pt is not having any CP,SOB, palpatations. Improves when he lays down and resolves after  an hour or so.   Multiple changes are being made after much consideration #1 D/C Bidil and cut the nitro component in half so ----isosorbide 10 mg po BID #2 Demedex 20 mg daily ,  Down from BID  Prn order if >3 pound weight gain in a day #3 continue Coreg 6.25 but make it q 12 hr instead of BID #4  Increase fliorinef to 0.2 mg daily and start today #5 Hold BP and fluid meds for SBP < 100  Acute on chronic combined systolic and diastolic congestive heart failure, NYHA class 4 All med changes made for hypotension were made with full knowledge that pt's CHF still needs treatment  CKD (chronic kidney disease) stage 3, GFR 30-59 ml/min Stable-BUN/Cr from yesterday was 26/1.2  Thrombocytopenia, unspecified PLT from yesterday 155  Iron deficiency anemia along with anemia of chronic dx - start ferrous sulfate 325 po BID    Margit Hanks, MD

## 2012-12-16 NOTE — Clinical Social Work Psychosocial (Addendum)
    Clinical Social Work Department BRIEF PSYCHOSOCIAL ASSESSMENT 12/16/2012  Patient:  Caleb Bauer, Caleb B[facility]     Account Number:  [id number]     Admit date:  12/06/2012  Clinical Social Worker:  Tiburcio Pea  Date/Caleb:  12/07/2012 05:00 PM  Referred by:  Physician  Date Referred:  12/07/2012 Referred for  SNF Placement   Other Referral:   REturn to SNF   Interview type:  Patient Other interview type:    PSYCHOSOCIAL DATA Living Status:  FACILITY Admitted from [facility] Level of care:  Skilled Nursing Facility Primary support name:  [facility] Primary support relationship to patient:  PARENT Degree of support available:   Good support  Has strong support from his neice    CURRENT CONCERNS Current Concerns  Post-Acute Placement   Other Concerns:   REturn to SNF    SOCIAL WORK ASSESSMENT / PLAN 62 year old male- resident of [facility].  He was a recent patient at [facility] and returned to facility. CSW met with pateint- he plans to dc back to SNF when stable.  Fl2 on chart. CSW spoke to [facility], RN Venora Maples stated that they would accept pateint back when medicallys stable.   Assessment/plan status:  Psychosocial Support/Ongoing Assessment of Needs Other assessment/ plan:   Information/referral to community resources:   None at this Caleb    PATIENT'S/FAMILY'S RESPONSE TO PLAN OF CARE: Patient wants to return to [facility] when stable.  He states that he will keep his family informed. pateint states that he is appreciative of CSW's visit and intervention. CSW will monitor.  Caleb Frederick. Stiles Bauer, LCSWA [phone]

## 2012-12-26 ENCOUNTER — Encounter: Payer: Self-pay | Admitting: Cardiology

## 2012-12-31 ENCOUNTER — Non-Acute Institutional Stay (SKILLED_NURSING_FACILITY): Payer: Medicare Other | Admitting: Internal Medicine

## 2012-12-31 ENCOUNTER — Encounter: Payer: Self-pay | Admitting: Internal Medicine

## 2012-12-31 DIAGNOSIS — I5043 Acute on chronic combined systolic (congestive) and diastolic (congestive) heart failure: Secondary | ICD-10-CM

## 2012-12-31 DIAGNOSIS — I509 Heart failure, unspecified: Secondary | ICD-10-CM

## 2012-12-31 NOTE — Assessment & Plan Note (Addendum)
Several weeks ago pt was getting hypotensive. With increased swellinge with light headedness requiring he lay down. Meds were changed and BP is better. However I expect that from time to time pt would need extra demedex. With increased swelling will order demedex BID for several days, with a BMP 10/30 and a BMP weekly, starting 11/3 which was ordered prior but has not been done. Pt's BUN/Cr need to be watched closely;prior he developed ARF over a week period.

## 2012-12-31 NOTE — Progress Notes (Signed)
MRN: [id number] Name: Caleb Bauer  Sex: male Age: 62 y.o. DOB: [birth year]  PSC #: Ronni Rumble Facility/Room: Level Of Care: SNF Provider: Merrilee Seashore D Emergency Contacts: Extended Emergency Contact Information Primary Emergency Contact: Simmons,Beatrice Address: [address]          [place], [place] [address] [place] of [place] Home Phone: [phone] Relation: Mother Secondary Emergency Contact: Kathe Mariner [place] Mobile Phone: [phone] Relation: Niece  Code Status: FULL  Allergies: Other  Chief Complaint  Patient presents with  . Acute Visit    HPI: Patient is 62 y.o. male who the nurses asked me to see because he is having some exremity swelling.  Past Medical History  Diagnosis Date  . Diabetes mellitus without complication   . CHF (congestive heart failure)   . Hypertension   . Cardiomyopathy, dilated   . Small bowel obstruction 10/2012  . COPD (chronic obstructive pulmonary disease)   . Herpes     BUTT AND BACK- 9/23 HEALED  . Atrial flutter by electrocardiogram 11/2012  . GERD (gastroesophageal reflux disease)     Past Surgical History  Procedure Laterality Date  . Laparoscopic incisional / umbilical / ventral hernia repair        Medication List       This list is accurate as of: 12/31/12 11:59 PM.  Always use your most recent med list.               allopurinol 100 MG tablet  Commonly known as:  ZYLOPRIM  Take 1 tablet (100 mg total) by mouth daily.     amiodarone 200 MG tablet  Commonly known as:  PACERONE  Take 1 tablet (200 mg total) by mouth daily.     apixaban 5 MG Tabs tablet  Commonly known as:  ELIQUIS  Take 1 tablet (5 mg total) by mouth every 12 (twelve) hours.     aspirin EC 81 MG tablet  Take 81 mg by mouth daily.     carvedilol 3.125 MG tablet  Commonly known as:  COREG  Take 1 tablet (3.125 mg total) by mouth 2 (two) times daily with a meal.     colchicine 0.6 MG tablet   Take 1 tablet (0.6 mg total) by mouth daily.     feeding supplement (PRO-STAT SUGAR FREE 64) Liqd  Take 30 mLs by mouth 3 (three) times daily with meals.     fludrocortisone 0.1 MG tablet  Commonly known as:  FLORINEF  Take 0.2 mg by mouth daily.     HYDROcodone-acetaminophen 5-325 MG per tablet  Commonly known as:  NORCO/VICODIN  Take one tablet by mouth every 6 hours as needed for pain     ipratropium-albuterol 0.5-2.5 (3) MG/3ML Soln  Commonly known as:  DUONEB  Take 3 mLs by nebulization 4 (four) times daily.     isosorbide dinitrate 10 MG tablet  Commonly known as:  ISORDIL  Take 10 mg by mouth 2 (two) times daily.     mirtazapine 7.5 MG tablet  Commonly known as:  REMERON  Take 7.5 mg by mouth at bedtime.     multivitamin with minerals Tabs tablet  Take 2 tablets by mouth daily.     omeprazole 40 MG capsule  Commonly known as:  PRILOSEC  Take 40 mg by mouth daily.     potassium chloride SA 20 MEQ tablet  Commonly known as:  K-DUR,KLOR-CON  Take 40 mEq by mouth daily.  torsemide 20 MG tablet  Commonly known as:  DEMADEX  Take 20 mg by mouth daily.        Meds ordered this encounter  Medications  . fludrocortisone (FLORINEF) 0.1 MG tablet    Sig: Take 0.2 mg by mouth daily.  Marland Kitchen torsemide (DEMADEX) 20 MG tablet    Sig: Take 20 mg by mouth daily.  . isosorbide dinitrate (ISORDIL) 10 MG tablet    Sig: Take 10 mg by mouth 2 (two) times daily.    Immunization History  Administered Date(s) Administered  . Influenza,inj,Quad PF,36+ Mos 12/07/2012  . Pneumococcal Polysaccharide 12/07/2012    History  Substance Use Topics  . Smoking status: Former Smoker -- 0.25 packs/day for .5 years    Types: Cigarettes    Quit date: 09/28/2012  . Smokeless tobacco: Never Used  . Alcohol Use: No    Review of Systems  DATA OBTAINED: from patient PT HAS NO C/O OTHER THAN HE HAS NOTICED SOME HAND SWELLING GENERAL: Feels well no fevers, fatigue, appetite  changes SKIN: No itching, rash HEENT: No complaint RESPIRATORY: No cough, wheezing, SOB CARDIAC: No chest pain, palpitations, SOME LOWER extremity edema  GI: No abdominal pain, No N/V/D or constipation, No heartburn or reflux  GU: No dysuria, frequency or urgency, or incontinence  MUSCULOSKELETAL: No unrelieved bone/joint pain NEUROLOGIC: No headache, dizziness or focal weakness PSYCHIATRIC: No overt anxiety or sadness. Sleeps well.   Filed Vitals:   12/31/12 1036  BP: 109/78  Pulse: 100  Temp: 98.9 F (37.2 C)  Resp: 20    Physical Exam  GENERAL APPEARANCE: Alert, conversant. Appropriately groomed. No acute distress LOOKING MUCH BETTER RECENTLY SKIN: No diaphoresis rash, or wounds HEENT: Unremarkable RESPIRATORY: Breathing is even, unlabored. Lung sounds are clear   CARDIOVASCULAR: Heart RRR no murmurs, rubs or gallops.mILD peripheral edema  GASTROINTESTINAL: Abdomen is soft, non-tender, not distended w/ normal bowel sounds.  GENITOURINARY: Bladder non tender, not distended  MUSCULOSKELETAL: No abnormal joints or musculature NEUROLOGIC: Cranial nerves 2-12 grossly intact. Moves all extremities no tremor. PSYCHIATRIC: Mood and affect appropriate to situation, no behavioral issues  Patient Active Problem List   Diagnosis Date Noted  . Iron deficiency anemia 12/14/2012  . Protein-calorie malnutrition, severe 12/10/2012  . GERD (gastroesophageal reflux disease)   . Nausea and vomiting 12/06/2012  . Atrial flutter by electrocardiogram   . Physical deconditioning 11/30/2012  . Hypotension 11/25/2012  . Dehydration 11/25/2012  . CKD (chronic kidney disease) stage 3, GFR 30-59 ml/min 11/25/2012  . Diarrhea 11/25/2012  . UTI (lower urinary tract infection) 11/25/2012  . Cardiogenic shock 11/25/2012  . Digoxin toxicity 11/23/2012  . Acute renal failure 11/23/2012  . COPD (chronic obstructive pulmonary disease)   . History of ventricular tachycardia 09/17/2012  . Abnormal  transaminases 09/17/2012  . Thrombocytopenia, unspecified 09/11/2012  . Acute on chronic combined systolic and diastolic congestive heart failure, NYHA class 4   . Hypertension   . Diabetes mellitus without complication     CBC    Component Value Date/Time   WBC 5.9 12/07/2012 0747   RBC 3.48* 12/07/2012 0747   HGB 8.7* 12/07/2012 0747   HCT 26.3* 12/07/2012 0747   PLT 92* 12/07/2012 0747   MCV 75.6* 12/07/2012 0747   LYMPHSABS 1.7 12/06/2012 1617   MONOABS 0.7 12/06/2012 1617   EOSABS 1.1* 12/06/2012 1617   BASOSABS 0.0 12/06/2012 1617    CMP     Component Value Date/Time   NA 139 12/09/2012 0940   K 3.9  12/09/2012 0940   CL 105 12/09/2012 0940   CO2 27 12/09/2012 0940   GLUCOSE 105* 12/09/2012 0940   BUN 27* 12/09/2012 0940   CREATININE 1.02 12/09/2012 0940   CALCIUM 6.8* 12/09/2012 0940   PROT 6.3 12/06/2012 2126   ALBUMIN 1.7* 12/06/2012 2126   AST 25 12/06/2012 2126   ALT 24 12/06/2012 2126   ALKPHOS 128* 12/06/2012 2126   BILITOT 0.7 12/06/2012 2126   GFRNONAA 77* 12/09/2012 0940   GFRAA 89* 12/09/2012 0940    Assessment and Plan  Acute on chronic combined systolic and diastolic congestive heart failure, NYHA class 4 Several weeks ago pt was getting hypotensive. With increased swellinge with light headedness requiring he lay down. Meds were changed and BP is better. However I expect that from time to time pt would need extra demedex. With increased swelling will order demedex BID for several days, with a BMP 10/30 and a BMP weekly, starting 11/3 which was ordered prior but has not been done. Pt's BUN/Cr need to be watched closely;prior he developed ARF over a week period.    Margit Hanks, MD

## 2013-01-18 ENCOUNTER — Non-Acute Institutional Stay (SKILLED_NURSING_FACILITY): Payer: Medicare Other | Admitting: Internal Medicine

## 2013-01-18 ENCOUNTER — Encounter: Payer: Self-pay | Admitting: Internal Medicine

## 2013-01-18 DIAGNOSIS — M109 Gout, unspecified: Secondary | ICD-10-CM

## 2013-01-18 NOTE — Progress Notes (Signed)
MRN: [id number] Name: Caleb [facility]  Sex: male Age: 62 y.o. DOB: 08-02-1950  PSC #: Ronni Rumble Facility/Room: 132B Level Of Care: SNF Provider: Merrilee Seashore D Emergency Contacts: Extended Emergency Contact Information Primary Emergency Contact: Simmons,Beatrice Address: [address]          [place], [place] [address] [place] of [place] Home Phone: [phone] Relation: Mother Secondary Emergency Contact: Kathe Mariner [place] Mobile Phone: [phone] Relation: Niece  Code Status: FULL  Allergies: Other  Chief Complaint  Patient presents with  . Medical Managment of Chronic Issues    HPI: Patient is 62 y.o. male who has pain and swelling of L hand for 3 days.  Past Medical History  Diagnosis Date  . Diabetes mellitus without complication   . CHF (congestive heart failure)   . Hypertension   . Cardiomyopathy, dilated   . Small bowel obstruction 10/2012  . COPD (chronic obstructive pulmonary disease)   . Herpes     BUTT AND BACK- 9/23 HEALED  . Atrial flutter by electrocardiogram 11/2012  . GERD (gastroesophageal reflux disease)   . Gout     Past Surgical History  Procedure Laterality Date  . Laparoscopic incisional / umbilical / ventral hernia repair        Medication List       This list is accurate as of: 01/18/13  8:30 PM.  Always use your most recent med list.               allopurinol 100 MG tablet  Commonly known as:  ZYLOPRIM  Take 1 tablet (100 mg total) by mouth daily.     amiodarone 200 MG tablet  Commonly known as:  PACERONE  Take 1 tablet (200 mg total) by mouth daily.     apixaban 5 MG Tabs tablet  Commonly known as:  ELIQUIS  Take 1 tablet (5 mg total) by mouth every 12 (twelve) hours.     aspirin EC 81 MG tablet  Take 81 mg by mouth daily.     carvedilol 3.125 MG tablet  Commonly known as:  COREG  Take 1 tablet (3.125 mg total) by mouth 2 (two) times daily with a meal.     colchicine 0.6  MG tablet  Take 1 tablet (0.6 mg total) by mouth daily.     feeding supplement (PRO-STAT SUGAR FREE 64) Liqd  Take 30 mLs by mouth 3 (three) times daily with meals.     fludrocortisone 0.1 MG tablet  Commonly known as:  FLORINEF  Take 0.2 mg by mouth daily.     HYDROcodone-acetaminophen 5-325 MG per tablet  Commonly known as:  NORCO/VICODIN  Take one tablet by mouth every 6 hours as needed for pain     ipratropium-albuterol 0.5-2.5 (3) MG/3ML Soln  Commonly known as:  DUONEB  Take 3 mLs by nebulization 4 (four) times daily.     isosorbide dinitrate 10 MG tablet  Commonly known as:  ISORDIL  Take 10 mg by mouth 2 (two) times daily.     mirtazapine 7.5 MG tablet  Commonly known as:  REMERON  Take 7.5 mg by mouth at bedtime.     multivitamin with minerals Tabs tablet  Take 2 tablets by mouth daily.     omeprazole 40 MG capsule  Commonly known as:  PRILOSEC  Take 40 mg by mouth daily.     potassium chloride SA 20 MEQ tablet  Commonly known as:  K-DUR,KLOR-CON  Take 40 mEq  by mouth daily.     torsemide 20 MG tablet  Commonly known as:  DEMADEX  Take 20 mg by mouth daily.        No orders of the defined types were placed in this encounter.    Immunization History  Administered Date(s) Administered  . Influenza,inj,Quad PF,36+ Mos 12/07/2012  . Pneumococcal Polysaccharide 12/07/2012    History  Substance Use Topics  . Smoking status: Former Smoker -- 0.25 packs/day for .5 years    Types: Cigarettes    Quit date: 09/28/2012  . Smokeless tobacco: Never Used  . Alcohol Use: No    Review of Systems  DATA OBTAINED: from patient GENERAL: Feels well no fevers, fatigue, appetite changes SKIN: No itching, rash HEENT: No complaint RESPIRATORY: No cough, wheezing, SOB CARDIAC: No chest pain, palpitations, lower extremity edema  GI: No abdominal pain, No N/V/D or constipation, No heartburn or reflux  GU: No dysuria, frequency or urgency, or incontinence   MUSCULOSKELETAL: swelling pain l hand for 3 days NEUROLOGIC: No headache, dizziness or focal weakness PSYCHIATRIC: No overt anxiety or sadness. Sleeps well.   Filed Vitals:   01/18/13 2013  BP: 134/87  Pulse: 111  Temp: 98 F (36.7 C)  Resp: 22    Physical Exam  GENERAL APPEARANCE: Alert, conversant. Appropriately groomed. No acute distress  SKIN: No diaphoresis rash, or wounds; L HAND WITH DIFFUSE SWELLING AND MILD WARMTH;NO REDNESS;WRIST AND ARM ARE NOT INVOLVED;NO PARTICULAR JOINT IN THE HAND IS INVOLVED HEENT: Unremarkable RESPIRATORY: Breathing is even, unlabored. Lung sounds are clear   CARDIOVASCULAR: Heart RRR no murmurs, rubs or gallops. No peripheral edema  GASTROINTESTINAL: Abdomen is soft, non-tender, not distended w/ normal bowel sounds.  GENITOURINARY: Bladder non tender, not distended  MUSCULOSKELETAL: No abnormal joints or musculature NEUROLOGIC: Cranial nerves 2-12 grossly intact. Moves all extremities no tremor. PSYCHIATRIC: Mood and affect appropriate to situation, no behavioral issues  Patient Active Problem List   Diagnosis Date Noted  . Gout   . Iron deficiency anemia 12/14/2012  . Protein-calorie malnutrition, severe 12/10/2012  . GERD (gastroesophageal reflux disease)   . Nausea and vomiting 12/06/2012  . Atrial flutter by electrocardiogram   . Physical deconditioning 11/30/2012  . Hypotension 11/25/2012  . Dehydration 11/25/2012  . CKD (chronic kidney disease) stage 3, GFR 30-59 ml/min 11/25/2012  . Diarrhea 11/25/2012  . UTI (lower urinary tract infection) 11/25/2012  . Cardiogenic shock 11/25/2012  . Digoxin toxicity 11/23/2012  . Acute renal failure 11/23/2012  . COPD (chronic obstructive pulmonary disease)   . History of ventricular tachycardia 09/17/2012  . Abnormal transaminases 09/17/2012  . Thrombocytopenia, unspecified 09/11/2012  . Acute on chronic combined systolic and diastolic congestive heart failure, NYHA class 4   .  Hypertension   . Diabetes mellitus without complication     CBC    Component Value Date/Time   WBC 5.9 12/07/2012 0747   RBC 3.48* 12/07/2012 0747   HGB 8.7* 12/07/2012 0747   HCT 26.3* 12/07/2012 0747   PLT 92* 12/07/2012 0747   MCV 75.6* 12/07/2012 0747   LYMPHSABS 1.7 12/06/2012 1617   MONOABS 0.7 12/06/2012 1617   EOSABS 1.1* 12/06/2012 1617   BASOSABS 0.0 12/06/2012 1617    CMP     Component Value Date/Time   NA 139 12/09/2012 0940   K 3.9 12/09/2012 0940   CL 105 12/09/2012 0940   CO2 27 12/09/2012 0940   GLUCOSE 105* 12/09/2012 0940   BUN 27* 12/09/2012 0940   CREATININE 1.02  12/09/2012 0940   CALCIUM 6.8* 12/09/2012 0940   PROT 6.3 12/06/2012 2126   ALBUMIN 1.7* 12/06/2012 2126   AST 25 12/06/2012 2126   ALT 24 12/06/2012 2126   ALKPHOS 128* 12/06/2012 2126   BILITOT 0.7 12/06/2012 2126   GFRNONAA 77* 12/09/2012 0940   GFRAA 89* 12/09/2012 0940    Assessment and Plan  Gout Pt is c/o L hand swelling and pain for about 3 days. He does have a problem with fluid retention but his R hand is not swollen. I think this could be gout and when stated pt said that is what he had been thinking too. Will treat with Colchicine 1.2 mg once with 0.6 mg following in an hour. If that does not improve will use short course prednisone 60/40/20. Pt understands and agrees.Will watch fluid.    Margit Hanks, MD

## 2013-01-18 NOTE — Assessment & Plan Note (Signed)
Pt is c/o L hand swelling and pain for about 3 days. He does have a problem with fluid retention but his R hand is not swollen. I think this could be gout and when stated pt said that is what he had been thinking too. Will treat with Colchicine 1.2 mg once with 0.6 mg following in an hour. If that does not improve will use short course prednisone 60/40/20. Pt understands and agrees.Will watch fluid.

## 2013-01-21 ENCOUNTER — Non-Acute Institutional Stay (SKILLED_NURSING_FACILITY): Payer: Medicare Other | Admitting: Internal Medicine

## 2013-01-21 ENCOUNTER — Encounter: Payer: Self-pay | Admitting: Internal Medicine

## 2013-01-21 DIAGNOSIS — E119 Type 2 diabetes mellitus without complications: Secondary | ICD-10-CM

## 2013-01-21 DIAGNOSIS — J449 Chronic obstructive pulmonary disease, unspecified: Secondary | ICD-10-CM

## 2013-01-21 DIAGNOSIS — D696 Thrombocytopenia, unspecified: Secondary | ICD-10-CM

## 2013-01-21 DIAGNOSIS — Z8679 Personal history of other diseases of the circulatory system: Secondary | ICD-10-CM

## 2013-01-21 DIAGNOSIS — M109 Gout, unspecified: Secondary | ICD-10-CM

## 2013-01-21 DIAGNOSIS — E43 Unspecified severe protein-calorie malnutrition: Secondary | ICD-10-CM

## 2013-01-21 DIAGNOSIS — I5043 Acute on chronic combined systolic (congestive) and diastolic (congestive) heart failure: Secondary | ICD-10-CM

## 2013-01-21 DIAGNOSIS — D509 Iron deficiency anemia, unspecified: Secondary | ICD-10-CM

## 2013-01-21 DIAGNOSIS — I509 Heart failure, unspecified: Secondary | ICD-10-CM

## 2013-01-21 NOTE — Progress Notes (Signed)
MRN: [id number] Name: Caleb Bauer  Sex: male Age: 62 y.o. DOB: 1950/12/07  PSC #: Ronni Rumble Facility/Room: 132B Level Of Care: SNF Provider: Merrilee Seashore D Emergency Contacts: Extended Emergency Contact Information Primary Emergency Contact: Simmons,Beatrice Address: [address]          [place], [place] [address] [place] Home Phone: [phone] Relation: Mother Secondary Emergency Contact: Kathe Mariner [place] Mobile Phone: [phone] Relation: Niece  Code Status: FULL  Allergies: Other  Chief Complaint  Patient presents with  . Discharge Note    HPI: Patient is 62 y.o. male who is being d/c to home.  Past Medical History  Diagnosis Date  . Diabetes mellitus without complication   . CHF (congestive heart failure)   . Hypertension   . Cardiomyopathy, dilated   . Small bowel obstruction 10/2012  . COPD (chronic obstructive pulmonary disease)   . Herpes     BUTT AND BACK- 9/23 HEALED  . Atrial flutter by electrocardiogram 11/2012  . GERD (gastroesophageal reflux disease)   . Gout     Past Surgical History  Procedure Laterality Date  . Laparoscopic incisional / umbilical / ventral hernia repair        Medication List       This list is accurate as of: 01/21/13  8:49 PM.  Always use your most recent med list.               allopurinol 100 MG tablet  Commonly known as:  ZYLOPRIM  Take 1 tablet (100 mg total) by mouth daily.     amiodarone 200 MG tablet  Commonly known as:  PACERONE  Take 1 tablet (200 mg total) by mouth daily.     apixaban 5 MG Tabs tablet  Commonly known as:  ELIQUIS  Take 1 tablet (5 mg total) by mouth every 12 (twelve) hours.     aspirin EC 81 MG tablet  Take 81 mg by mouth daily.     carvedilol 3.125 MG tablet  Commonly known as:  COREG  Take 1 tablet (3.125 mg total) by mouth 2 (two) times daily with a meal.     colchicine 0.6 MG tablet  Take 1 tablet (0.6 mg total) by  mouth daily.     feeding supplement (PRO-STAT SUGAR FREE 64) Liqd  Take 30 mLs by mouth 3 (three) times daily with meals.     fludrocortisone 0.1 MG tablet  Commonly known as:  FLORINEF  Take 0.2 mg by mouth daily.     HYDROcodone-acetaminophen 5-325 MG per tablet  Commonly known as:  NORCO/VICODIN  Take one tablet by mouth every 6 hours as needed for pain     ipratropium-albuterol 0.5-2.5 (3) MG/3ML Soln  Commonly known as:  DUONEB  Take 3 mLs by nebulization 4 (four) times daily.     isosorbide dinitrate 10 MG tablet  Commonly known as:  ISORDIL  Take 10 mg by mouth 2 (two) times daily.     mirtazapine 7.5 MG tablet  Commonly known as:  REMERON  Take 7.5 mg by mouth at bedtime.     multivitamin with minerals Tabs tablet  Take 2 tablets by mouth daily.     omeprazole 40 MG capsule  Commonly known as:  PRILOSEC  Take 40 mg by mouth daily.     potassium chloride SA 20 MEQ tablet  Commonly known as:  K-DUR,KLOR-CON  Take 40 mEq by mouth daily.     torsemide  20 MG tablet  Commonly known as:  DEMADEX  Take 20 mg by mouth daily.        No orders of the defined types were placed in this encounter.    Immunization History  Administered Date(s) Administered  . Influenza,inj,Quad PF,36+ Mos 12/07/2012  . Pneumococcal Polysaccharide-23 12/07/2012    History  Substance Use Topics  . Smoking status: Former Smoker -- 0.25 packs/day for .5 years    Types: Cigarettes    Quit date: 09/28/2012  . Smokeless tobacco: Never Used  . Alcohol Use: No    Filed Vitals:   01/21/13 1341  BP: 126/72  Pulse: 82  Temp: 97.6 F (36.4 C)  Resp: 20    Physical Exam  GENERAL APPEARANCE: Alert, conversant. Appropriately groomed. No acute distress.  HEENT: Unremarkable. RESPIRATORY: Breathing is even, unlabored. Lung sounds are clear   CARDIOVASCULAR: Heart RRR no murmurs, rubs or gallops. No peripheral edema.   NEUROLOGIC: Cranial nerves 2-12 grossly intact. Moves all  extremities no tremor.  Patient Active Problem List   Diagnosis Date Noted  . Gout   . Iron deficiency anemia 12/14/2012  . Protein-calorie malnutrition, severe 12/10/2012  . GERD (gastroesophageal reflux disease)   . Nausea and vomiting 12/06/2012  . Atrial flutter by electrocardiogram   . Physical deconditioning 11/30/2012  . Hypotension 11/25/2012  . Dehydration 11/25/2012  . CKD (chronic kidney disease) stage 3, GFR 30-59 ml/min 11/25/2012  . Diarrhea 11/25/2012  . UTI (lower urinary tract infection) 11/25/2012  . Cardiogenic shock 11/25/2012  . Digoxin toxicity 11/23/2012  . Acute renal failure 11/23/2012  . COPD (chronic obstructive pulmonary disease)   . History of ventricular tachycardia 09/17/2012  . Abnormal transaminases 09/17/2012  . Thrombocytopenia, unspecified 09/11/2012  . Acute on chronic combined systolic and diastolic congestive heart failure, NYHA class 4   . Hypertension   . Diabetes mellitus without complication     CBC    Component Value Date/Time   WBC 5.9 12/07/2012 0747   RBC 3.48* 12/07/2012 0747   HGB 8.7* 12/07/2012 0747   HCT 26.3* 12/07/2012 0747   PLT 92* 12/07/2012 0747   MCV 75.6* 12/07/2012 0747   LYMPHSABS 1.7 12/06/2012 1617   MONOABS 0.7 12/06/2012 1617   EOSABS 1.1* 12/06/2012 1617   BASOSABS 0.0 12/06/2012 1617    CMP     Component Value Date/Time   NA 139 12/09/2012 0940   K 3.9 12/09/2012 0940   CL 105 12/09/2012 0940   CO2 27 12/09/2012 0940   GLUCOSE 105* 12/09/2012 0940   BUN 27* 12/09/2012 0940   CREATININE 1.02 12/09/2012 0940   CALCIUM 6.8* 12/09/2012 0940   PROT 6.3 12/06/2012 2126   ALBUMIN 1.7* 12/06/2012 2126   AST 25 12/06/2012 2126   ALT 24 12/06/2012 2126   ALKPHOS 128* 12/06/2012 2126   BILITOT 0.7 12/06/2012 2126   GFRNONAA 77* 12/09/2012 0940   GFRAA 89* 12/09/2012 0940    Assessment and Plan  Pt is being d/c to home in stable and improved condition with OT/PT.  Margit Hanks, MD

## 2013-02-28 ENCOUNTER — Encounter (HOSPITAL_COMMUNITY): Payer: Self-pay | Admitting: Emergency Medicine

## 2013-02-28 ENCOUNTER — Emergency Department (HOSPITAL_COMMUNITY): Payer: Medicare Other

## 2013-02-28 DIAGNOSIS — K219 Gastro-esophageal reflux disease without esophagitis: Secondary | ICD-10-CM | POA: Insufficient documentation

## 2013-02-28 DIAGNOSIS — Z87891 Personal history of nicotine dependence: Secondary | ICD-10-CM | POA: Insufficient documentation

## 2013-02-28 DIAGNOSIS — Z8619 Personal history of other infectious and parasitic diseases: Secondary | ICD-10-CM | POA: Insufficient documentation

## 2013-02-28 DIAGNOSIS — I509 Heart failure, unspecified: Secondary | ICD-10-CM | POA: Insufficient documentation

## 2013-02-28 DIAGNOSIS — M109 Gout, unspecified: Secondary | ICD-10-CM | POA: Insufficient documentation

## 2013-02-28 DIAGNOSIS — I10 Essential (primary) hypertension: Secondary | ICD-10-CM | POA: Insufficient documentation

## 2013-02-28 DIAGNOSIS — Z7982 Long term (current) use of aspirin: Secondary | ICD-10-CM | POA: Insufficient documentation

## 2013-02-28 DIAGNOSIS — Z8719 Personal history of other diseases of the digestive system: Secondary | ICD-10-CM | POA: Insufficient documentation

## 2013-02-28 DIAGNOSIS — I4892 Unspecified atrial flutter: Secondary | ICD-10-CM | POA: Insufficient documentation

## 2013-02-28 DIAGNOSIS — E119 Type 2 diabetes mellitus without complications: Secondary | ICD-10-CM | POA: Insufficient documentation

## 2013-02-28 DIAGNOSIS — J4489 Other specified chronic obstructive pulmonary disease: Secondary | ICD-10-CM | POA: Insufficient documentation

## 2013-02-28 DIAGNOSIS — J449 Chronic obstructive pulmonary disease, unspecified: Secondary | ICD-10-CM | POA: Insufficient documentation

## 2013-02-28 DIAGNOSIS — Z79899 Other long term (current) drug therapy: Secondary | ICD-10-CM | POA: Insufficient documentation

## 2013-02-28 DIAGNOSIS — I428 Other cardiomyopathies: Secondary | ICD-10-CM | POA: Insufficient documentation

## 2013-02-28 NOTE — ED Notes (Signed)
Pt. reports left knee pain with swelling for 2 days , denies injury or fall , pt. stated history of gout .

## 2013-03-01 ENCOUNTER — Emergency Department (HOSPITAL_COMMUNITY)
Admission: EM | Admit: 2013-03-01 | Discharge: 2013-03-01 | Disposition: A | Discharge status: Home / Self Care | Payer: Medicare Other | Attending: Emergency Medicine | Admitting: Emergency Medicine

## 2013-03-01 ENCOUNTER — Encounter: Payer: Self-pay | Admitting: *Deleted

## 2013-03-01 DIAGNOSIS — M109 Gout, unspecified: Secondary | ICD-10-CM

## 2013-03-01 DIAGNOSIS — M25562 Pain in left knee: Secondary | ICD-10-CM

## 2013-03-01 MED ORDER — COLCHICINE 0.6 MG PO TABS: 0.6000 mg | ORAL_TABLET | Freq: Every day | ORAL | Status: DC

## 2013-03-01 MED ORDER — OXYCODONE-ACETAMINOPHEN 5-325 MG PO TABS
2.0000 | ORAL_TABLET | Freq: Once | ORAL | Status: AC
  Administered 2013-03-01: 2 via ORAL
  Filled 2013-03-01: qty 2

## 2013-03-01 MED ORDER — MELOXICAM 15 MG PO TABS: 15.0000 mg | ORAL_TABLET | Freq: Every day | ORAL | Status: DC

## 2013-03-01 MED ORDER — OXYCODONE-ACETAMINOPHEN 5-325 MG PO TABS: 1.0000 | ORAL_TABLET | Freq: Four times a day (QID) | ORAL | Status: DC | PRN

## 2013-03-01 NOTE — ED Provider Notes (Signed)
Medical screening examination/treatment/procedure(s) were performed by non-physician practitioner and as supervising physician I was immediately available for consultation/collaboration.   Dione Booze, MD 03/01/13 2229

## 2013-03-01 NOTE — ED Provider Notes (Signed)
CSN: [id number]     Arrival date & time 02/28/13  2111 History   First MD Initiated Contact with Patient 03/01/13 0017     Chief Complaint  Patient presents with  . Knee Pain   HPI  History provided by the patient and family. Patient is a 62 year old male with history of hypertension, diabetes, COPD, CHF and gout who presents with complaints of worsening left knee pain and swelling. Symptoms first began on Friday and have been worsening over the past few days. Patient states symptoms are similar to previous gout in the same knee. Pain has become severe making it difficult to ambulate. The patient does use a cane with some improvement. Patient was using some over-the-counter pain medicines but states has not helped significantly. Patient has previously been on Vicodin for some chronic pain issues but has not had refilled recently. Patient also reports being out of his colchicine for gout. He denies any weakness or numbness in the foot. Denies any fever, chills or sweats. No other aggravating or alleviating factors. No other associated symptoms.   Past Medical History  Diagnosis Date  . Diabetes mellitus without complication   . CHF (congestive heart failure)   . Hypertension   . Cardiomyopathy, dilated   . Small bowel obstruction 10/2012  . COPD (chronic obstructive pulmonary disease)   . Herpes     BUTT AND BACK- 9/23 HEALED  . Atrial flutter by electrocardiogram 11/2012  . GERD (gastroesophageal reflux disease)   . Gout    Past Surgical History  Procedure Laterality Date  . Laparoscopic incisional / umbilical / ventral hernia repair     Family History  Problem Relation Age of Onset  . Cancer Neg Hx    History  Substance Use Topics  . Smoking status: Former Smoker -- 0.25 packs/day for .5 years    Types: Cigarettes    Quit date: 09/28/2012  . Smokeless tobacco: Never Used  . Alcohol Use: No    Review of Systems  Constitutional: Negative for fever, chills and diaphoresis.   Respiratory: Negative for shortness of breath.   Cardiovascular: Negative for chest pain.  All other systems reviewed and are negative.    Allergies  Other  Home Medications   Current Outpatient Rx  Name  Route  Sig  Dispense  Refill  . allopurinol (ZYLOPRIM) 100 MG tablet   Oral   Take 1 tablet (100 mg total) by mouth daily.   30 tablet   3   . amiodarone (PACERONE) 200 MG tablet   Oral   Take 1 tablet (200 mg total) by mouth daily.   30 tablet   0   . apixaban (ELIQUIS) 5 MG TABS tablet   Oral   Take 1 tablet (5 mg total) by mouth every 12 (twelve) hours.   60 tablet      . aspirin EC 81 MG tablet   Oral   Take 81 mg by mouth daily.         . carvedilol (COREG) 3.125 MG tablet   Oral   Take 1 tablet (3.125 mg total) by mouth 2 (two) times daily with a meal.         . colchicine 0.6 MG tablet   Oral   Take 1 tablet (0.6 mg total) by mouth daily.   30 tablet   3   . feeding supplement (PRO-STAT SUGAR FREE 64) LIQD   Oral   Take 30 mLs by mouth 3 (three) times daily with meals.         Marland Kitchen  fludrocortisone (FLORINEF) 0.1 MG tablet   Oral   Take 0.2 mg by mouth daily.         Marland Kitchen HYDROcodone-acetaminophen (NORCO/VICODIN) 5-325 MG per tablet      Take one tablet by mouth every 6 hours as needed for pain   120 tablet   0   . ipratropium-albuterol (DUONEB) 0.5-2.5 (3) MG/3ML SOLN   Nebulization   Take 3 mLs by nebulization 4 (four) times daily.         . isosorbide dinitrate (ISORDIL) 10 MG tablet   Oral   Take 10 mg by mouth 2 (two) times daily.         . mirtazapine (REMERON) 7.5 MG tablet   Oral   Take 7.5 mg by mouth at bedtime.         . Multiple Vitamin (MULTIVITAMIN WITH MINERALS) TABS tablet   Oral   Take 2 tablets by mouth daily.          Marland Kitchen omeprazole (PRILOSEC) 40 MG capsule   Oral   Take 40 mg by mouth daily.         . potassium chloride SA (K-DUR,KLOR-CON) 20 MEQ tablet   Oral   Take 40 mEq by mouth daily.          Marland Kitchen torsemide (DEMADEX) 20 MG tablet   Oral   Take 20 mg by mouth daily.          BP 122/74  Pulse 105  Temp(Src) 100.2 F (37.9 C) (Oral)  Resp 18  SpO2 93% Physical Exam  Nursing note and vitals reviewed. Constitutional: He is oriented to person, place, and time. He appears well-developed and well-nourished. No distress.  HENT:  Head: Normocephalic.  Cardiovascular: Normal rate and regular rhythm.   Pulmonary/Chest: Effort normal and breath sounds normal. No respiratory distress. He has no wheezes. He has no rales.  Abdominal: Soft.  Musculoskeletal:  Reduced ROM of left knee secondary to pain.  Moderate swelling.  There is increased warmth. No gross deformity.  Normal distal pulses and sensations.  Skin normal.  Neurological: He is alert and oriented to person, place, and time.  Skin: Skin is warm. No rash noted.  Psychiatric: He has a normal mood and affect. His behavior is normal.    ED Course  Procedures   DIAGNOSTIC STUDIES: Oxygen Saturation is 95% on room air.  COORDINATION OF CARE:  Nursing notes reviewed. Vital signs reviewed. Initial pt interview and examination performed.   12:26 AM-patient seen and evaluated. Patient appears in some discomfort no acute distress. X-rays reviewed with patient. Significant arthritic changes and some spurring present. Patient with previous history of gout with similar symptoms consistent of gout today. We'll plan to give treatment for gout. Patient's is ambulatory using his cane. He agrees with plan and ready for discharge.   Imaging Review Dg Knee Complete 4 Views Left  02/28/2013   CLINICAL DATA:  Left knee pain for 4 days.  No injury.  EXAM: LEFT KNEE - COMPLETE 4+ VIEW  COMPARISON:  09/12/2012  FINDINGS: Old metallic fragment medial to the left knee. Degenerative changes in the left knee with narrowed medial and lateral compartments and associated hypertrophic changes. Hypertrophic changes in the patellofemoral  compartment. Moderate size left knee effusion. No displaced fractures identified. Vascular calcifications in the soft tissues.  IMPRESSION: Degenerative changes in the left knee. Left knee effusion. No displaced fractures identified.   Electronically Signed   By: Burman Nieves M.D.   On: 02/28/2013  23:02    MDM   1. Gout   2. Knee pain, acute, left        Angus Seller, PA-C 03/01/13 2213

## 2013-03-09 ENCOUNTER — Encounter (HOSPITAL_COMMUNITY): Payer: Self-pay | Admitting: Emergency Medicine

## 2013-03-09 ENCOUNTER — Emergency Department (HOSPITAL_COMMUNITY): Payer: Medicare Other

## 2013-03-09 ENCOUNTER — Inpatient Hospital Stay (HOSPITAL_COMMUNITY)
Admission: EM | Admit: 2013-03-09 | Discharge: 2013-03-15 | DRG: 871 | Disposition: A | Discharge status: Home Health | Payer: Medicare Other | Attending: Internal Medicine | Admitting: Internal Medicine

## 2013-03-09 DIAGNOSIS — R748 Abnormal levels of other serum enzymes: Secondary | ICD-10-CM

## 2013-03-09 DIAGNOSIS — E119 Type 2 diabetes mellitus without complications: Secondary | ICD-10-CM

## 2013-03-09 DIAGNOSIS — N179 Acute kidney failure, unspecified: Secondary | ICD-10-CM

## 2013-03-09 DIAGNOSIS — R6521 Severe sepsis with septic shock: Secondary | ICD-10-CM

## 2013-03-09 DIAGNOSIS — D696 Thrombocytopenia, unspecified: Secondary | ICD-10-CM

## 2013-03-09 DIAGNOSIS — R1310 Dysphagia, unspecified: Secondary | ICD-10-CM | POA: Diagnosis present

## 2013-03-09 DIAGNOSIS — I951 Orthostatic hypotension: Secondary | ICD-10-CM | POA: Diagnosis present

## 2013-03-09 DIAGNOSIS — R6520 Severe sepsis without septic shock: Secondary | ICD-10-CM

## 2013-03-09 DIAGNOSIS — J449 Chronic obstructive pulmonary disease, unspecified: Secondary | ICD-10-CM

## 2013-03-09 DIAGNOSIS — I4892 Unspecified atrial flutter: Secondary | ICD-10-CM

## 2013-03-09 DIAGNOSIS — M109 Gout, unspecified: Secondary | ICD-10-CM

## 2013-03-09 DIAGNOSIS — J9690 Respiratory failure, unspecified, unspecified whether with hypoxia or hypercapnia: Secondary | ICD-10-CM

## 2013-03-09 DIAGNOSIS — J9819 Other pulmonary collapse: Secondary | ICD-10-CM | POA: Diagnosis present

## 2013-03-09 DIAGNOSIS — E872 Acidosis, unspecified: Secondary | ICD-10-CM

## 2013-03-09 DIAGNOSIS — N390 Urinary tract infection, site not specified: Secondary | ICD-10-CM

## 2013-03-09 DIAGNOSIS — G9341 Metabolic encephalopathy: Secondary | ICD-10-CM | POA: Diagnosis present

## 2013-03-09 DIAGNOSIS — R112 Nausea with vomiting, unspecified: Secondary | ICD-10-CM

## 2013-03-09 DIAGNOSIS — I119 Hypertensive heart disease without heart failure: Secondary | ICD-10-CM | POA: Diagnosis present

## 2013-03-09 DIAGNOSIS — E861 Hypovolemia: Secondary | ICD-10-CM | POA: Diagnosis present

## 2013-03-09 DIAGNOSIS — I509 Heart failure, unspecified: Secondary | ICD-10-CM | POA: Diagnosis present

## 2013-03-09 DIAGNOSIS — E860 Dehydration: Secondary | ICD-10-CM

## 2013-03-09 DIAGNOSIS — Z87891 Personal history of nicotine dependence: Secondary | ICD-10-CM

## 2013-03-09 DIAGNOSIS — I4510 Unspecified right bundle-branch block: Secondary | ICD-10-CM | POA: Diagnosis present

## 2013-03-09 DIAGNOSIS — N183 Chronic kidney disease, stage 3 unspecified: Secondary | ICD-10-CM

## 2013-03-09 DIAGNOSIS — R197 Diarrhea, unspecified: Secondary | ICD-10-CM

## 2013-03-09 DIAGNOSIS — I10 Essential (primary) hypertension: Secondary | ICD-10-CM

## 2013-03-09 DIAGNOSIS — I959 Hypotension, unspecified: Secondary | ICD-10-CM

## 2013-03-09 DIAGNOSIS — G9340 Encephalopathy, unspecified: Secondary | ICD-10-CM

## 2013-03-09 DIAGNOSIS — Z7982 Long term (current) use of aspirin: Secondary | ICD-10-CM

## 2013-03-09 DIAGNOSIS — Z8679 Personal history of other diseases of the circulatory system: Secondary | ICD-10-CM

## 2013-03-09 DIAGNOSIS — B952 Enterococcus as the cause of diseases classified elsewhere: Secondary | ICD-10-CM | POA: Diagnosis present

## 2013-03-09 DIAGNOSIS — I428 Other cardiomyopathies: Secondary | ICD-10-CM | POA: Diagnosis present

## 2013-03-09 DIAGNOSIS — E43 Unspecified severe protein-calorie malnutrition: Secondary | ICD-10-CM

## 2013-03-09 DIAGNOSIS — J4489 Other specified chronic obstructive pulmonary disease: Secondary | ICD-10-CM | POA: Diagnosis present

## 2013-03-09 DIAGNOSIS — R5381 Other malaise: Secondary | ICD-10-CM

## 2013-03-09 DIAGNOSIS — R570 Cardiogenic shock: Secondary | ICD-10-CM

## 2013-03-09 DIAGNOSIS — G4733 Obstructive sleep apnea (adult) (pediatric): Secondary | ICD-10-CM | POA: Diagnosis present

## 2013-03-09 DIAGNOSIS — N170 Acute kidney failure with tubular necrosis: Secondary | ICD-10-CM | POA: Diagnosis present

## 2013-03-09 DIAGNOSIS — K7200 Acute and subacute hepatic failure without coma: Secondary | ICD-10-CM | POA: Diagnosis present

## 2013-03-09 DIAGNOSIS — A419 Sepsis, unspecified organism: Principal | ICD-10-CM

## 2013-03-09 DIAGNOSIS — I5043 Acute on chronic combined systolic (congestive) and diastolic (congestive) heart failure: Secondary | ICD-10-CM

## 2013-03-09 DIAGNOSIS — T460X1A Poisoning by cardiac-stimulant glycosides and drugs of similar action, accidental (unintentional), initial encounter: Secondary | ICD-10-CM

## 2013-03-09 DIAGNOSIS — Z66 Do not resuscitate: Secondary | ICD-10-CM | POA: Diagnosis present

## 2013-03-09 DIAGNOSIS — N19 Unspecified kidney failure: Secondary | ICD-10-CM

## 2013-03-09 DIAGNOSIS — E875 Hyperkalemia: Secondary | ICD-10-CM

## 2013-03-09 DIAGNOSIS — K5289 Other specified noninfective gastroenteritis and colitis: Secondary | ICD-10-CM | POA: Diagnosis present

## 2013-03-09 DIAGNOSIS — I129 Hypertensive chronic kidney disease with stage 1 through stage 4 chronic kidney disease, or unspecified chronic kidney disease: Secondary | ICD-10-CM | POA: Diagnosis present

## 2013-03-09 DIAGNOSIS — D509 Iron deficiency anemia, unspecified: Secondary | ICD-10-CM

## 2013-03-09 DIAGNOSIS — K219 Gastro-esophageal reflux disease without esophagitis: Secondary | ICD-10-CM

## 2013-03-09 DIAGNOSIS — D638 Anemia in other chronic diseases classified elsewhere: Secondary | ICD-10-CM | POA: Diagnosis present

## 2013-03-09 DIAGNOSIS — Z79899 Other long term (current) drug therapy: Secondary | ICD-10-CM

## 2013-03-09 DIAGNOSIS — J9600 Acute respiratory failure, unspecified whether with hypoxia or hypercapnia: Secondary | ICD-10-CM

## 2013-03-09 DIAGNOSIS — J189 Pneumonia, unspecified organism: Secondary | ICD-10-CM | POA: Diagnosis present

## 2013-03-09 LAB — CBC WITH DIFFERENTIAL/PLATELET
BASOS ABS: 0 10*3/uL (ref 0.0–0.1)
Basophils Relative: 0 % (ref 0–1)
EOS PCT: 0 % (ref 0–5)
Eosinophils Absolute: 0 10*3/uL (ref 0.0–0.7)
HCT: 34.9 % — ABNORMAL LOW (ref 39.0–52.0)
Hemoglobin: 10.6 g/dL — ABNORMAL LOW (ref 13.0–17.0)
Lymphocytes Relative: 9 % — ABNORMAL LOW (ref 12–46)
Lymphs Abs: 1 10*3/uL (ref 0.7–4.0)
MCH: 25.9 pg — ABNORMAL LOW (ref 26.0–34.0)
MCHC: 30.4 g/dL (ref 30.0–36.0)
MCV: 85.3 fL (ref 78.0–100.0)
MONO ABS: 1.2 10*3/uL — AB (ref 0.1–1.0)
Monocytes Relative: 10 % (ref 3–12)
NEUTROS PCT: 81 % — AB (ref 43–77)
Neutro Abs: 9.3 10*3/uL — ABNORMAL HIGH (ref 1.7–7.7)
Platelets: 298 10*3/uL (ref 150–400)
RBC: 4.09 MIL/uL — AB (ref 4.22–5.81)
RDW: 17.5 % — ABNORMAL HIGH (ref 11.5–15.5)
WBC: 11.5 10*3/uL — AB (ref 4.0–10.5)

## 2013-03-09 LAB — POCT I-STAT TROPONIN I: Troponin i, poc: 0.01 ng/mL (ref 0.00–0.08)

## 2013-03-09 LAB — GLUCOSE, CAPILLARY: Glucose-Capillary: 56 mg/dL — ABNORMAL LOW (ref 70–99)

## 2013-03-09 LAB — CG4 I-STAT (LACTIC ACID): LACTIC ACID, VENOUS: 9.62 mmol/L — AB (ref 0.5–2.2)

## 2013-03-09 MED ORDER — VANCOMYCIN HCL IN DEXTROSE 1-5 GM/200ML-% IV SOLN
1000.0000 mg | Freq: Once | INTRAVENOUS | Status: AC
  Administered 2013-03-10: 1000 mg via INTRAVENOUS
  Filled 2013-03-09: qty 200

## 2013-03-09 MED ORDER — CALCIUM GLUCONATE 10 % IV SOLN
1.0000 g | Freq: Once | INTRAVENOUS | Status: AC
Start: 2013-03-09 — End: 2013-03-10
  Administered 2013-03-10: 1 g via INTRAVENOUS
  Filled 2013-03-09: qty 10

## 2013-03-09 MED ORDER — DEXTROSE 5 % IV SOLN
2.0000 g | Freq: Once | INTRAVENOUS | Status: AC
  Administered 2013-03-10: 2 g via INTRAVENOUS

## 2013-03-09 MED ORDER — INSULIN ASPART 100 UNIT/ML IV SOLN: 5.0000 [IU] | Freq: Once | INTRAVENOUS | Status: DC

## 2013-03-09 MED ORDER — SODIUM CHLORIDE 0.9 % IV BOLUS (SEPSIS)
1000.0000 mL | Freq: Once | INTRAVENOUS | Status: AC
  Administered 2013-03-10: 1000 mL via INTRAVENOUS

## 2013-03-09 MED ORDER — SODIUM CHLORIDE 0.9 % IV SOLN: 1000.0000 mL | INTRAVENOUS | Status: DC

## 2013-03-09 MED ORDER — DEXTROSE 50 % IV SOLN
50.0000 mL | Freq: Once | INTRAVENOUS | Status: AC
Start: 2013-03-09 — End: 2013-03-09
  Administered 2013-03-09: 50 mL via INTRAVENOUS
  Filled 2013-03-09: qty 50

## 2013-03-09 MED ORDER — SODIUM CHLORIDE 0.9 % IV SOLN
1000.0000 mL | Freq: Once | INTRAVENOUS | Status: AC
  Administered 2013-03-10: 1000 mL via INTRAVENOUS

## 2013-03-09 MED ORDER — SODIUM CHLORIDE 0.9 % IV SOLN
1000.0000 mL | Freq: Once | INTRAVENOUS | Status: AC
Start: 2013-03-09 — End: 2013-03-10
  Administered 2013-03-10: 1000 mL via INTRAVENOUS

## 2013-03-09 MED ORDER — INSULIN ASPART 100 UNIT/ML ~~LOC~~ SOLN
5.0000 [IU] | Freq: Once | SUBCUTANEOUS | Status: AC
  Administered 2013-03-10: 5 [IU] via INTRAVENOUS
  Filled 2013-03-09: qty 1

## 2013-03-09 MED ORDER — DEXTROSE 50 % IV SOLN
50.0000 mL | Freq: Once | INTRAVENOUS | Status: AC
  Administered 2013-03-10: 50 mL via INTRAVENOUS
  Filled 2013-03-09: qty 50

## 2013-03-09 NOTE — ED Notes (Signed)
Per EMS, pt. Has had weakness, N/V, and cough for the past 2 days.

## 2013-03-09 NOTE — ED Notes (Signed)
Patient still in CT Scan

## 2013-03-09 NOTE — ED Notes (Signed)
Pt transported to CT ?

## 2013-03-09 NOTE — ED Provider Notes (Signed)
CSN: [id number]     Arrival date & time 03/09/13  2144 History   First MD Initiated Contact with Patient 03/09/13 2201     Chief Complaint  Patient presents with  . Fatigue  . Nausea   (Consider location/radiation/quality/duration/timing/severity/associated sxs/prior Treatment) HPI Comments: 63 year old male presents with generalized weakness, cough and congestion for the past couple days. The patient was lethargic and sleeping a lot today per the niece. Patient has not had any fevers. No urinary symptoms. Denies headaches or focal weakness. No chest pain. Family feels he's been slurring his words all day today.    Past Medical History  Diagnosis Date  . Diabetes mellitus without complication   . CHF (congestive heart failure)   . Hypertension   . Cardiomyopathy, dilated   . Small bowel obstruction 10/2012  . COPD (chronic obstructive pulmonary disease)   . Herpes     BUTT AND BACK- 9/23 HEALED  . Atrial flutter by electrocardiogram 11/2012  . GERD (gastroesophageal reflux disease)   . Gout    Past Surgical History  Procedure Laterality Date  . Laparoscopic incisional / umbilical / ventral hernia repair     Family History  Problem Relation Age of Onset  . Cancer Neg Hx    History  Substance Use Topics  . Smoking status: Former Smoker -- 0.25 packs/day for .5 years    Types: Cigarettes    Quit date: 09/28/2012  . Smokeless tobacco: Never Used  . Alcohol Use: No    Review of Systems  Unable to perform ROS: Mental status change    Allergies  Other  Home Medications   Current Outpatient Rx  Name  Route  Sig  Dispense  Refill  . allopurinol (ZYLOPRIM) 100 MG tablet   Oral   Take 1 tablet (100 mg total) by mouth daily.   30 tablet   3   . amiodarone (PACERONE) 200 MG tablet   Oral   Take 1 tablet (200 mg total) by mouth daily.   30 tablet   0   . apixaban (ELIQUIS) 5 MG TABS tablet   Oral   Take 1 tablet (5 mg total) by mouth every 12 (twelve) hours.   60  tablet      . aspirin EC 81 MG tablet   Oral   Take 81 mg by mouth daily.         . carvedilol (COREG) 3.125 MG tablet   Oral   Take 1 tablet (3.125 mg total) by mouth 2 (two) times daily with a meal.         . colchicine 0.6 MG tablet   Oral   Take 1 tablet (0.6 mg total) by mouth daily.   30 tablet   3   . colchicine 0.6 MG tablet   Oral   Take 1 tablet (0.6 mg total) by mouth daily.   20 tablet   0   . feeding supplement (PRO-STAT SUGAR FREE 64) LIQD   Oral   Take 30 mLs by mouth 3 (three) times daily with meals.         . fludrocortisone (FLORINEF) 0.1 MG tablet   Oral   Take 0.2 mg by mouth daily.         Marland Kitchen HYDROcodone-acetaminophen (NORCO/VICODIN) 5-325 MG per tablet      Take one tablet by mouth every 6 hours as needed for pain   120 tablet   0   . ipratropium-albuterol (DUONEB) 0.5-2.5 (3) MG/3ML SOLN  Nebulization   Take 3 mLs by nebulization 4 (four) times daily.         . isosorbide dinitrate (ISORDIL) 10 MG tablet   Oral   Take 10 mg by mouth 2 (two) times daily.         . meloxicam (MOBIC) 15 MG tablet   Oral   Take 1 tablet (15 mg total) by mouth daily.   20 tablet   0   . mirtazapine (REMERON) 7.5 MG tablet   Oral   Take 7.5 mg by mouth at bedtime.         . Multiple Vitamin (MULTIVITAMIN WITH MINERALS) TABS tablet   Oral   Take 2 tablets by mouth daily.          Marland Kitchen omeprazole (PRILOSEC) 40 MG capsule   Oral   Take 40 mg by mouth daily.         Marland Kitchen oxyCODONE-acetaminophen (PERCOCET/ROXICET) 5-325 MG per tablet   Oral   Take 1-2 tablets by mouth every 6 (six) hours as needed for severe pain.   10 tablet   0   . potassium chloride SA (K-DUR,KLOR-CON) 20 MEQ tablet   Oral   Take 40 mEq by mouth daily.         Marland Kitchen torsemide (DEMADEX) 20 MG tablet   Oral   Take 20 mg by mouth daily.          BP 144/81  Temp(Src) 97.5 F (36.4 C) (Oral)  Resp 26  SpO2 95% Physical Exam  Nursing note and vitals  reviewed. Constitutional: He appears well-developed and well-nourished. He appears ill.  HENT:  Head: Normocephalic and atraumatic.  Right Ear: External ear normal.  Left Ear: External ear normal.  Nose: Nose normal.  Eyes: Pupils are equal, round, and reactive to light. Right eye exhibits no discharge. Left eye exhibits no discharge.  Neck: Neck supple.  Cardiovascular: Normal rate, regular rhythm, normal heart sounds and intact distal pulses.   Pulmonary/Chest: Tachypnea noted. He has rales.  Abdominal: Soft. He exhibits no distension. There is no tenderness.  Musculoskeletal: He exhibits no edema.  Neurological: He is alert. He is disoriented. GCS eye subscore is 3. GCS verbal subscore is 4. GCS motor subscore is 6.  Grossly normal strength in all 4 extremities, does not follow commands well. Slight left sided facial droop.  Skin: Skin is warm and dry.    ED Course  INTUBATION Date/Time: 03/10/2013 12:30 AM Performed by: Ephraim Hamburger Authorized by: Sherwood Gambler T Consent: Verbal consent obtained. The procedure was performed in an emergent situation. Risks and benefits: risks, benefits and alternatives were discussed Consent given by: power of attorney Indications: respiratory failure, airway protection and hypoxemia Intubation method: video-assisted Patient status: paralyzed (RSI) Preoxygenation: nonrebreather mask Sedatives: etomidate Paralytic: rocuronium Tube size: 8.0 mm Tube type: cuffed Number of attempts: 1 Cords visualized: yes Post-procedure assessment: chest rise and CO2 detector Breath sounds: equal Cuff inflated: yes ETT to lip: 26 cm Tube secured with: ETT holder Chest x-ray interpreted by me. Chest x-ray findings: endotracheal tube in appropriate position Patient tolerance: Patient tolerated the procedure well with no immediate complications.   (including critical care time) Labs Review Labs Reviewed  GLUCOSE, CAPILLARY - Abnormal; Notable for  the following:    Glucose-Capillary 56 (*)    All other components within normal limits  CBC WITH DIFFERENTIAL - Abnormal; Notable for the following:    WBC 11.5 (*)    RBC 4.09 (*)  Hemoglobin 10.6 (*)    HCT 34.9 (*)    MCH 25.9 (*)    RDW 17.5 (*)    Neutrophils Relative % 81 (*)    Lymphocytes Relative 9 (*)    Neutro Abs 9.3 (*)    Monocytes Absolute 1.2 (*)    All other components within normal limits  COMPREHENSIVE METABOLIC PANEL - Abnormal; Notable for the following:    Potassium 7.3 (*)    CO2 11 (*)    Glucose, Bld 61 (*)    BUN 53 (*)    Creatinine, Ser 1.97 (*)    Total Protein 8.9 (*)    Albumin 3.0 (*)    AST 484 (*)    ALT 328 (*)    Alkaline Phosphatase 165 (*)    Total Bilirubin 1.4 (*)    GFR calc non Af Amer 35 (*)    GFR calc Af Amer 40 (*)    All other components within normal limits  GLUCOSE, CAPILLARY - Abnormal; Notable for the following:    Glucose-Capillary 100 (*)    All other components within normal limits  CG4 I-STAT (LACTIC ACID) - Abnormal; Notable for the following:    Lactic Acid, Venous 9.62 (*)    All other components within normal limits  CULTURE, BLOOD (ROUTINE X 2)  CULTURE, BLOOD (ROUTINE X 2)  CK  URINALYSIS, ROUTINE W REFLEX MICROSCOPIC  INFLUENZA PANEL BY PCR (TYPE A & B, H1N1)  POCT I-STAT TROPONIN I   Imaging Review No results found.  EKG Interpretation    Date/Time:  Wednesday March 09 2013 22:26:37 EST Ventricular Rate:  75 PR Interval:  293 QRS Duration: 197 QT Interval:  529 QTC Calculation: 591 R Axis:   -77 Text Interpretation:  Age not entered, assumed to be  63 years old for purpose of ECG interpretation Sinus rhythm Prolonged PR interval Probable left atrial enlargement RBBB and LAFB Abnormal T, consider ischemia, lateral leads Baseline wander in lead(s) V2 V5 No significant change since last tracing Confirmed by Kreg Earhart  MD, Ricci Paff ([id number]) on 03/09/2013 11:04:37 PM           CRITICAL  CARE Performed by: Sherwood Gambler T   Total critical care time: 60 minutes  Critical care time was exclusive of separately billable procedures and treating other patients.  Critical care was necessary to treat or prevent imminent or life-threatening deterioration.  Critical care was time spent personally by me on the following activities: development of treatment plan with patient and/or surrogate as well as nursing, discussions with consultants, evaluation of patient's response to treatment, examination of patient, obtaining history from patient or surrogate, ordering and performing treatments and interventions, ordering and review of laboratory studies, ordering and review of radiographic studies, pulse oximetry and re-evaluation of patient's condition.  MDM   1. Respiratory failure   2. Lactic acidosis   3. Sepsis   4. Hyperkalemia   5. Renal failure    Patient with GCS 13 initially but appearing lethargic. Vomited initially but none since. Describing URI sx and progressive lethargy. CXR c/w PNA vs ARDS vs pulm edema. Last in hosp 3 months ago, will cover for HCAP. Lactic acid almost 10, given fluids and code sepsis called. Hyperkalemic as well with new renal insufficiency, given calcium, insulin and D50. Throughout stay patient became more lethargic and had new O2 requirement up to a NRB. Given this, decision made to intubate after talking with niece who is his POA. I discussed he is critically  ill and may need dialysis or other interventions. Critical care consulted and will take over management and admit to ICU.     Ephraim Hamburger, MD 03/10/13 [phone]

## 2013-03-10 ENCOUNTER — Inpatient Hospital Stay (HOSPITAL_COMMUNITY): Payer: Medicare Other

## 2013-03-10 ENCOUNTER — Emergency Department (HOSPITAL_COMMUNITY): Payer: Medicare Other

## 2013-03-10 DIAGNOSIS — I5043 Acute on chronic combined systolic (congestive) and diastolic (congestive) heart failure: Secondary | ICD-10-CM

## 2013-03-10 DIAGNOSIS — N179 Acute kidney failure, unspecified: Secondary | ICD-10-CM

## 2013-03-10 DIAGNOSIS — R7402 Elevation of levels of lactic acid dehydrogenase (LDH): Secondary | ICD-10-CM

## 2013-03-10 DIAGNOSIS — A419 Sepsis, unspecified organism: Secondary | ICD-10-CM

## 2013-03-10 DIAGNOSIS — R6520 Severe sepsis without septic shock: Secondary | ICD-10-CM

## 2013-03-10 DIAGNOSIS — R740 Nonspecific elevation of levels of transaminase and lactic acid dehydrogenase [LDH]: Secondary | ICD-10-CM

## 2013-03-10 DIAGNOSIS — J9600 Acute respiratory failure, unspecified whether with hypoxia or hypercapnia: Secondary | ICD-10-CM

## 2013-03-10 DIAGNOSIS — I509 Heart failure, unspecified: Secondary | ICD-10-CM

## 2013-03-10 HISTORY — DX: Sepsis, unspecified organism: R65.20

## 2013-03-10 HISTORY — DX: Sepsis, unspecified organism: A41.9

## 2013-03-10 LAB — CBC
HCT: 26.4 % — ABNORMAL LOW (ref 39.0–52.0)
HEMATOCRIT: 27.3 % — AB (ref 39.0–52.0)
Hemoglobin: 8.2 g/dL — ABNORMAL LOW (ref 13.0–17.0)
Hemoglobin: 8.5 g/dL — ABNORMAL LOW (ref 13.0–17.0)
MCH: 25.6 pg — AB (ref 26.0–34.0)
MCH: 25.4 pg — ABNORMAL LOW (ref 26.0–34.0)
MCHC: 31.1 g/dL (ref 30.0–36.0)
MCHC: 31.1 g/dL (ref 30.0–36.0)
MCV: 82.5 fL (ref 78.0–100.0)
MCV: 81.7 fL (ref 78.0–100.0)
PLATELETS: 203 10*3/uL (ref 150–400)
Platelets: 192 10*3/uL (ref 150–400)
RBC: 3.2 MIL/uL — ABNORMAL LOW (ref 4.22–5.81)
RBC: 3.34 MIL/uL — AB (ref 4.22–5.81)
RDW: 17 % — AB (ref 11.5–15.5)
RDW: 17.3 % — ABNORMAL HIGH (ref 11.5–15.5)
WBC: 8.7 10*3/uL (ref 4.0–10.5)
WBC: 7 10*3/uL (ref 4.0–10.5)

## 2013-03-10 LAB — COMPREHENSIVE METABOLIC PANEL
ALBUMIN: 3 g/dL — AB (ref 3.5–5.2)
ALT: 328 U/L — ABNORMAL HIGH (ref 0–53)
ALT: 319 U/L — ABNORMAL HIGH (ref 0–53)
AST: 484 U/L — AB (ref 0–37)
AST: 585 U/L — ABNORMAL HIGH (ref 0–37)
Albumin: 2.4 g/dL — ABNORMAL LOW (ref 3.5–5.2)
Alkaline Phosphatase: 165 U/L — ABNORMAL HIGH (ref 39–117)
Alkaline Phosphatase: 126 U/L — ABNORMAL HIGH (ref 39–117)
BILIRUBIN TOTAL: 1.4 mg/dL — AB (ref 0.3–1.2)
BILIRUBIN TOTAL: 1.1 mg/dL (ref 0.3–1.2)
BUN: 53 mg/dL — ABNORMAL HIGH (ref 6–23)
BUN: 55 mg/dL — ABNORMAL HIGH (ref 6–23)
CHLORIDE: 105 meq/L (ref 96–112)
CHLORIDE: 110 meq/L (ref 96–112)
CO2: 11 mEq/L — ABNORMAL LOW (ref 19–32)
CO2: 18 meq/L — AB (ref 19–32)
CREATININE: 1.97 mg/dL — AB (ref 0.50–1.35)
CREATININE: 1.88 mg/dL — AB (ref 0.50–1.35)
Calcium: 9.5 mg/dL (ref 8.4–10.5)
Calcium: 8.7 mg/dL (ref 8.4–10.5)
GFR calc Af Amer: 40 mL/min — ABNORMAL LOW (ref >90)
GFR calc Af Amer: 43 mL/min — ABNORMAL LOW (ref >90)
GFR calc non Af Amer: 35 mL/min — ABNORMAL LOW (ref >90)
GFR, EST NON AFRICAN AMERICAN: 37 mL/min — AB (ref >90)
Glucose, Bld: 61 mg/dL — ABNORMAL LOW (ref 70–99)
Glucose, Bld: 159 mg/dL — ABNORMAL HIGH (ref 70–99)
Potassium: 7.3 mEq/L (ref 3.7–5.3)
Potassium: 6 mEq/L — ABNORMAL HIGH (ref 3.7–5.3)
Sodium: 144 mEq/L (ref 137–147)
Sodium: 144 mEq/L (ref 137–147)
TOTAL PROTEIN: 8.9 g/dL — AB (ref 6.0–8.3)
Total Protein: 7 g/dL (ref 6.0–8.3)

## 2013-03-10 LAB — URINE MICROSCOPIC-ADD ON

## 2013-03-10 LAB — INFLUENZA PANEL BY PCR (TYPE A & B)
H1N1FLUPCR: NOT DETECTED
Influenza A By PCR: NEGATIVE
Influenza B By PCR: NEGATIVE

## 2013-03-10 LAB — BASIC METABOLIC PANEL
BUN: 54 mg/dL — ABNORMAL HIGH (ref 6–23)
BUN: 48 mg/dL — ABNORMAL HIGH (ref 6–23)
CO2: 21 meq/L (ref 19–32)
CO2: 23 mEq/L (ref 19–32)
Calcium: 8.5 mg/dL (ref 8.4–10.5)
Calcium: 7.9 mg/dL — ABNORMAL LOW (ref 8.4–10.5)
Chloride: 109 mEq/L (ref 96–112)
Chloride: 112 mEq/L (ref 96–112)
Creatinine, Ser: 1.79 mg/dL — ABNORMAL HIGH (ref 0.50–1.35)
Creatinine, Ser: 1.62 mg/dL — ABNORMAL HIGH (ref 0.50–1.35)
GFR calc Af Amer: 45 mL/min — ABNORMAL LOW (ref >90)
GFR calc Af Amer: 51 mL/min — ABNORMAL LOW (ref >90)
GFR calc non Af Amer: 39 mL/min — ABNORMAL LOW (ref >90)
GFR, EST NON AFRICAN AMERICAN: 44 mL/min — AB (ref >90)
Glucose, Bld: 99 mg/dL (ref 70–99)
Glucose, Bld: 69 mg/dL — ABNORMAL LOW (ref 70–99)
POTASSIUM: 4.6 meq/L (ref 3.7–5.3)
Potassium: 5.6 mEq/L — ABNORMAL HIGH (ref 3.7–5.3)
SODIUM: 143 meq/L (ref 137–147)
SODIUM: 145 meq/L (ref 137–147)

## 2013-03-10 LAB — URINALYSIS, ROUTINE W REFLEX MICROSCOPIC
BILIRUBIN URINE: NEGATIVE
Bilirubin Urine: NEGATIVE
Glucose, UA: NEGATIVE mg/dL
Glucose, UA: NEGATIVE mg/dL
KETONES UR: NEGATIVE mg/dL
Ketones, ur: NEGATIVE mg/dL
NITRITE: POSITIVE — AB
Nitrite: NEGATIVE
PROTEIN: 100 mg/dL — AB
Protein, ur: 30 mg/dL — AB
SPECIFIC GRAVITY, URINE: 1.018 (ref 1.005–1.030)
Specific Gravity, Urine: 1.019 (ref 1.005–1.030)
UROBILINOGEN UA: 1 mg/dL (ref 0.0–1.0)
Urobilinogen, UA: 1 mg/dL (ref 0.0–1.0)
pH: 5 (ref 5.0–8.0)
pH: 5 (ref 5.0–8.0)

## 2013-03-10 LAB — BLOOD GAS, ARTERIAL
ACID-BASE DEFICIT: 6.3 mmol/L — AB (ref 0.0–2.0)
BICARBONATE: 18.6 meq/L — AB (ref 20.0–24.0)
Drawn by: 39899
FIO2: 0.6 %
O2 Saturation: 100 %
PCO2 ART: 37.1 mmHg (ref 35.0–45.0)
PEEP: 5 cmH2O
PH ART: 7.322 — AB (ref 7.350–7.450)
PO2 ART: 253 mmHg — AB (ref 80.0–100.0)
Patient temperature: 98.6
RATE: 35 resp/min
TCO2: 19.8 mmol/L (ref 0–100)
VT: 430 mL

## 2013-03-10 LAB — POCT I-STAT 3, ART BLOOD GAS (G3+)
Acid-base deficit: 11 mmol/L — ABNORMAL HIGH (ref 0.0–2.0)
Acid-base deficit: 4 mmol/L — ABNORMAL HIGH (ref 0.0–2.0)
Bicarbonate: 17.4 mEq/L — ABNORMAL LOW (ref 20.0–24.0)
Bicarbonate: 19.6 mEq/L — ABNORMAL LOW (ref 20.0–24.0)
O2 SAT: 100 %
O2 Saturation: 100 %
PCO2 ART: 30.6 mmHg — AB (ref 35.0–45.0)
PH ART: 7.413 (ref 7.350–7.450)
Patient temperature: 98.4
TCO2: 19 mmol/L (ref 0–100)
TCO2: 20 mmol/L (ref 0–100)
pCO2 arterial: 45.6 mmHg — ABNORMAL HIGH (ref 35.0–45.0)
pH, Arterial: 7.183 — CL (ref 7.350–7.450)
pO2, Arterial: 280 mmHg — ABNORMAL HIGH (ref 80.0–100.0)
pO2, Arterial: 453 mmHg — ABNORMAL HIGH (ref 80.0–100.0)

## 2013-03-10 LAB — GLUCOSE, CAPILLARY
GLUCOSE-CAPILLARY: 79 mg/dL (ref 70–99)
Glucose-Capillary: 100 mg/dL — ABNORMAL HIGH (ref 70–99)
Glucose-Capillary: 125 mg/dL — ABNORMAL HIGH (ref 70–99)
Glucose-Capillary: 66 mg/dL — ABNORMAL LOW (ref 70–99)
Glucose-Capillary: 82 mg/dL (ref 70–99)
Glucose-Capillary: 92 mg/dL (ref 70–99)

## 2013-03-10 LAB — LIPASE, BLOOD: Lipase: 16 U/L (ref 11–59)

## 2013-03-10 LAB — AMYLASE: Amylase: 44 U/L (ref 0–105)

## 2013-03-10 LAB — STREP PNEUMONIAE URINARY ANTIGEN: Strep Pneumo Urinary Antigen: NEGATIVE

## 2013-03-10 LAB — HEPATITIS PANEL, ACUTE
HCV Ab: NEGATIVE
Hep A IgM: NONREACTIVE
Hep B C IgM: NONREACTIVE
Hepatitis B Surface Ag: NEGATIVE

## 2013-03-10 LAB — TRIGLYCERIDES: TRIGLYCERIDES: 133 mg/dL (ref <150)

## 2013-03-10 LAB — MRSA PCR SCREENING: MRSA by PCR: NEGATIVE

## 2013-03-10 LAB — CK: Total CK: 59 U/L (ref 7–232)

## 2013-03-10 LAB — LACTIC ACID, PLASMA: LACTIC ACID, VENOUS: 3.6 mmol/L — AB (ref 0.5–2.2)

## 2013-03-10 LAB — CORTISOL: CORTISOL PLASMA: 26.2 ug/dL

## 2013-03-10 MED ORDER — ASPIRIN 300 MG RE SUPP
300.0000 mg | RECTAL | Status: AC
  Administered 2013-03-10: 300 mg via RECTAL
  Filled 2013-03-10: qty 1

## 2013-03-10 MED ORDER — CHLORHEXIDINE GLUCONATE 0.12 % MT SOLN
15.0000 mL | Freq: Two times a day (BID) | OROMUCOSAL | Status: DC
  Administered 2013-03-10 – 2013-03-12 (×5): 15 mL via OROMUCOSAL
  Filled 2013-03-10 (×6): qty 15

## 2013-03-10 MED ORDER — DEXTROSE 50 % IV SOLN
25.0000 mL | Freq: Once | INTRAVENOUS | Status: AC | PRN
  Administered 2013-03-10: 25 mL via INTRAVENOUS
  Filled 2013-03-10: qty 50

## 2013-03-10 MED ORDER — FENTANYL BOLUS VIA INFUSION
50.0000 ug | INTRAVENOUS | Status: DC | PRN
  Filled 2013-03-10: qty 100

## 2013-03-10 MED ORDER — PROPOFOL 10 MG/ML IV EMUL
5.0000 ug/kg/min | INTRAVENOUS | Status: DC
  Administered 2013-03-10: 10 ug/kg/min via INTRAVENOUS
  Filled 2013-03-10: qty 100

## 2013-03-10 MED ORDER — BIOTENE DRY MOUTH MT LIQD
15.0000 mL | Freq: Four times a day (QID) | OROMUCOSAL | Status: DC
  Administered 2013-03-10: 15 mL via OROMUCOSAL

## 2013-03-10 MED ORDER — SODIUM CHLORIDE 0.9 % IV SOLN: 250.0000 mL | INTRAVENOUS | Status: DC | PRN

## 2013-03-10 MED ORDER — SODIUM CHLORIDE 0.9 % IV SOLN: INTRAVENOUS | Status: DC

## 2013-03-10 MED ORDER — ASPIRIN 81 MG PO CHEW: 324.0000 mg | CHEWABLE_TABLET | ORAL | Status: AC

## 2013-03-10 MED ORDER — PROPOFOL 10 MG/ML IV EMUL
0.0000 ug/kg/min | INTRAVENOUS | Status: DC
  Administered 2013-03-10 (×2): 20 ug/kg/min via INTRAVENOUS
  Administered 2013-03-10: 40 ug/kg/min via INTRAVENOUS
  Administered 2013-03-10: 25 ug/kg/min via INTRAVENOUS
  Administered 2013-03-11: 20 ug/kg/min via INTRAVENOUS
  Administered 2013-03-11: 25 ug/kg/min via INTRAVENOUS
  Filled 2013-03-10 (×6): qty 100

## 2013-03-10 MED ORDER — SODIUM CHLORIDE 0.9 % IV SOLN
0.0000 ug/h | INTRAVENOUS | Status: DC
  Administered 2013-03-10: 50 ug/h via INTRAVENOUS
  Filled 2013-03-10: qty 50

## 2013-03-10 MED ORDER — SUCCINYLCHOLINE CHLORIDE 20 MG/ML IJ SOLN
INTRAMUSCULAR | Status: AC
  Filled 2013-03-10: qty 1

## 2013-03-10 MED ORDER — ETOMIDATE 2 MG/ML IV SOLN: 20.0000 mg | Freq: Once | INTRAVENOUS | Status: DC

## 2013-03-10 MED ORDER — HEPARIN SODIUM (PORCINE) 5000 UNIT/ML IJ SOLN
5000.0000 [IU] | Freq: Three times a day (TID) | INTRAMUSCULAR | Status: DC
  Administered 2013-03-10 – 2013-03-15 (×15): 5000 [IU] via SUBCUTANEOUS
  Filled 2013-03-10 (×19): qty 1

## 2013-03-10 MED ORDER — ETOMIDATE 2 MG/ML IV SOLN
20.0000 mg | Freq: Once | INTRAVENOUS | Status: AC
  Administered 2013-03-10: 20 mg via INTRAVENOUS

## 2013-03-10 MED ORDER — SODIUM CHLORIDE 0.9 % IV BOLUS (SEPSIS): 1000.0000 mL | INTRAVENOUS | Status: DC | PRN

## 2013-03-10 MED ORDER — SODIUM POLYSTYRENE SULFONATE 15 GM/60ML PO SUSP
45.0000 g | Freq: Once | ORAL | Status: AC
  Administered 2013-03-10: 45 g via ORAL
  Filled 2013-03-10: qty 180

## 2013-03-10 MED ORDER — VANCOMYCIN HCL 10 G IV SOLR
1500.0000 mg | INTRAVENOUS | Status: DC
  Administered 2013-03-10 – 2013-03-11 (×2): 1500 mg via INTRAVENOUS
  Filled 2013-03-10 (×3): qty 1500

## 2013-03-10 MED ORDER — ETOMIDATE 2 MG/ML IV SOLN
INTRAVENOUS | Status: AC
  Filled 2013-03-10: qty 20

## 2013-03-10 MED ORDER — SODIUM CHLORIDE 0.9 % IV SOLN
INTRAVENOUS | Status: DC
  Administered 2013-03-10 (×2): via INTRAVENOUS

## 2013-03-10 MED ORDER — FENTANYL CITRATE 0.05 MG/ML IJ SOLN: 50.0000 ug | Freq: Once | INTRAMUSCULAR | Status: DC

## 2013-03-10 MED ORDER — ROCURONIUM BROMIDE 50 MG/5ML IV SOLN
INTRAVENOUS | Status: AC
  Filled 2013-03-10: qty 2

## 2013-03-10 MED ORDER — PANTOPRAZOLE SODIUM 40 MG IV SOLR
40.0000 mg | Freq: Every day | INTRAVENOUS | Status: DC
  Administered 2013-03-10 – 2013-03-12 (×3): 40 mg via INTRAVENOUS
  Filled 2013-03-10 (×4): qty 40

## 2013-03-10 MED ORDER — STERILE WATER FOR INJECTION IV SOLN
INTRAVENOUS | Status: DC
Start: 2013-03-10 — End: 2013-03-11
  Administered 2013-03-10 – 2013-03-11 (×3): via INTRAVENOUS
  Filled 2013-03-10 (×5): qty 850

## 2013-03-10 MED ORDER — ROCURONIUM BROMIDE 50 MG/5ML IV SOLN: 50.0000 mg/kg | Freq: Once | INTRAVENOUS | Status: DC

## 2013-03-10 MED ORDER — LIDOCAINE HCL (CARDIAC) 20 MG/ML IV SOLN
INTRAVENOUS | Status: AC
  Filled 2013-03-10: qty 5

## 2013-03-10 MED ORDER — BIOTENE DRY MOUTH MT LIQD
15.0000 mL | Freq: Four times a day (QID) | OROMUCOSAL | Status: DC
  Administered 2013-03-10 – 2013-03-12 (×7): 15 mL via OROMUCOSAL

## 2013-03-10 MED ORDER — CHLORHEXIDINE GLUCONATE 0.12 % MT SOLN
15.0000 mL | Freq: Two times a day (BID) | OROMUCOSAL | Status: DC
  Administered 2013-03-10: 15 mL via OROMUCOSAL
  Filled 2013-03-10 (×2): qty 15

## 2013-03-10 MED ORDER — ROCURONIUM BROMIDE 50 MG/5ML IV SOLN
50.0000 mg | Freq: Once | INTRAVENOUS | Status: AC
  Administered 2013-03-10: 50 mg via INTRAVENOUS

## 2013-03-10 MED ORDER — OXEPA PO LIQD
1000.0000 mL | ORAL | Status: DC
  Filled 2013-03-10 (×3): qty 1000

## 2013-03-10 MED ORDER — OSELTAMIVIR PHOSPHATE 6 MG/ML PO SUSR
30.0000 mg | Freq: Every day | ORAL | Status: DC
  Administered 2013-03-10: 30 mg via ORAL
  Filled 2013-03-10: qty 5

## 2013-03-10 MED ORDER — DEXTROSE 5 % IV SOLN
2.0000 g | Freq: Two times a day (BID) | INTRAVENOUS | Status: DC
  Administered 2013-03-10 – 2013-03-11 (×4): 2 g via INTRAVENOUS
  Filled 2013-03-10 (×5): qty 2

## 2013-03-10 NOTE — Progress Notes (Signed)
INITIAL NUTRITION ASSESSMENT  DOCUMENTATION CODES Per approved criteria  -Obesity Unspecified   INTERVENTION:  1. Recommend Initiate Vital High Protein @ 10 ml/hr (goal rate)  2. 60 ml Prostat five times per day.    3. MVI daily  At goal rate, tube feeding regimen provides 1240 kcal, 171 grams of protein, and 200 ml of H2O.   TF regimen and current Propofol rate provides: 1620 kcal (14 kcal/kg actual body weight)   NUTRITION DIAGNOSIS: Inadequate oral intake related to inability to eat as evidenced by NPO status.  Goal: Enteral nutrition to provide 60-70% of estimated calorie needs (22-25 kcals/kg ideal body weight) and 100% of estimated protein needs, based on ASPEN guidelines for permissive underfeeding in critically ill obese individuals.  Monitor:  TF initiation and tolerance, weight trend, labs   Reason for Assessment: Consult received to initiate and manage enteral nutrition support.  63 y.o. male  Admitting Dx: <principal problem not specified>  ASSESSMENT: Pt admitted in multiorgan failure likely due to septic shock from HCAP. Pt with acute on chronic renal failure and hyperkalemia. HD catheter placed for possible dialysis.  Patient is currently intubated on ventilator support.  MV: 10.7 L/min Temp (24hrs), Avg:97.5 F (36.4 C), Min:96.6 F (35.9 C), Max:98.4 F (36.9 C)  Propofol: 14.4 ml/hr providing   Pt now off of pressors and ARDS protocol. No family present. TF's currently on hold, per RN pt had 2000 ml out of OG tube overnight.   Nutrition Focused Physical Exam:  Subcutaneous Fat:  Orbital Region: WNL Upper Arm Region: WNL Thoracic and Lumbar Region: WNl  Muscle:  Temple Region: WNL Clavicle Bone Region: WNL Clavicle and Acromion Bone Region: WNL Scapular Bone Region: WNL Dorsal Hand: WNL Patellar Region: WNl Anterior Thigh Region: WNL Posterior Calf Region: WNL  Edema: present    Height: Ht Readings from Last 1 Encounters:  03/10/13  6' 0.83" (1.85 m)    Weight: Wt Readings from Last 1 Encounters:  03/10/13 245 lb 9.5 oz (111.4 kg)    Ideal Body Weight: 80.9 kg   % Ideal Body Weight: 138%  Wt Readings from Last 10 Encounters:  03/10/13 245 lb 9.5 oz (111.4 kg)  12/09/12 182 lb 6.4 oz (82.736 kg)  12/02/12 191 lb 9.3 oz (86.9 kg)  12/02/12 191 lb 9.3 oz (86.9 kg)  10/13/12 220 lb 14.4 oz (100.2 kg)  09/27/12 252 lb 10.4 oz (114.6 kg)    Usual Body Weight: 190-250 lb   % Usual Body Weight: within range  BMI:  Body mass index is 32.55 kg/(m^2).  Estimated Nutritional Needs: Kcal: 2125 Protein: >/= 161 grams Fluid: > 1.5 L/day  Skin: no issues noted  Diet Order:    EDUCATION NEEDS: -No education needs identified at this time   Intake/Output Summary (Last 24 hours) at 03/10/13 0926 Last data filed at 03/10/13 0900  Gross per 24 hour  Intake 2478.22 ml  Output   3160 ml  Net -681.78 ml    Last BM: PTA   Labs:   Recent Labs Lab 03/09/13 2228 03/10/13 0315 03/10/13 0800  NA 144 144 143  K 7.3* 6.0* 5.6*  CL 105 110 109  CO2 11* 18* 21  BUN 53* 55* 54*  CREATININE 1.97* 1.88* 1.79*  CALCIUM 9.5 8.7 8.5  GLUCOSE 61* 159* 99    CBG (last 3)   Recent Labs  03/10/13 03/10/13 0459 03/10/13 0807  GLUCAP 100* 125* 92   Lab Results  Component Value Date  HGB 8.2* 03/10/2013   Scheduled Meds: . antiseptic oral rinse  15 mL Mouth Rinse QID  . cefTAZidime (FORTAZ)  IV  2 g Intravenous Q12H  . chlorhexidine  15 mL Mouth Rinse BID  . fentaNYL  50 mcg Intravenous Once  . heparin  5,000 Units Subcutaneous Q8H  . lidocaine (cardiac) 100 mg/16ml      . oseltamivir  30 mg Oral Daily  . pantoprazole (PROTONIX) IV  40 mg Intravenous QHS  . succinylcholine      . vancomycin  1,500 mg Intravenous Q24H    Continuous Infusions: . sodium chloride    . sodium chloride 200 mL/hr at 03/10/13 0900  . feeding supplement (OXEPA)    . fentaNYL infusion INTRAVENOUS 25 mcg/hr (03/10/13 0900)   . propofol 20.048 mcg/kg/min (03/10/13 0900)  .  sodium bicarbonate 150 mEq in sterile water 1000 mL infusion 150 mL/hr at 03/10/13 0900    Past Medical History  Diagnosis Date  . Diabetes mellitus without complication   . CHF (congestive heart failure)   . Hypertension   . Cardiomyopathy, dilated   . Small bowel obstruction 10/2012  . COPD (chronic obstructive pulmonary disease)   . Herpes     BUTT AND BACK- 9/23 HEALED  . Atrial flutter by electrocardiogram 11/2012  . GERD (gastroesophageal reflux disease)   . Gout     Past Surgical History  Procedure Laterality Date  . Laparoscopic incisional / umbilical / ventral hernia repair      Maylon Peppers RD, LDN, [place] Pager [phone] After Hours Pager

## 2013-03-10 NOTE — Procedures (Signed)
Central Venous Catheter Insertion Procedure Note LAMARCUS SPIRA [id number] 08/09/1950  Procedure: Insertion of Central Venous Catheter Indications: Assessment of intravascular volume, Drug and/or fluid administration, Frequent blood sampling and possibel hd  Procedure Details Consent: Risks of procedure as well as the alternatives and risks of each were explained to the (patient/caregiver).  Consent for procedure obtained. Time Out: Verified patient identification, verified procedure, site/side was marked, verified correct patient position, special equipment/implants available, medications/allergies/relevent history reviewed, required imaging and test results available.  Performed  Maximum sterile technique was used including antiseptics, cap, gloves, gown, hand hygiene, mask and sheet. Skin prep: Chlorhexidine; local anesthetic administered A antimicrobial bonded/coated triple lumen catheter was placed in the right internal jugular vein using the Seldinger technique.  Evaluation Blood flow good Complications: No apparent complications Patient did tolerate procedure well. Chest X-ray ordered to verify placement.  CXR: pending.  Raylene Miyamoto 03/10/2013, 2:39 AM  [place] Shakea Isip J. Titus Mould, MD, [place] Pgr: [facility]

## 2013-03-10 NOTE — H&P (Signed)
Name: RHIAN FUNARI MRN: [id number] DOB: Dec 20, 1950    ADMISSION DATE:  03/09/2013  REFERRING MD :  Regenia Skeeter PRIMARY SERVICE: PCCM  CHIEF COMPLAINT:  Fatigue  BRIEF PATIENT DESCRIPTION: ORENTHAL DEBSKI is a 63 year old male with an extensive PMH including SCHF, DM, COPD who presented to [facility] in multiorgan failure likely due to septic shock from HCAP.  SIGNIFICANT EVENTS / STUDIES:  03/09/13 CT head >>>>neg acute 1/7- MODS, ETT  LINES / TUBES: 03/09/13 Intubated 03/10/13 Right HD catheter>>> 03/09/13 Left external jugular PIV 03/10/13 Foley 03/10/13 OG  CULTURES: 03/09/13 Blood Cultures 03/09/13 Urine Culture 03/09/13 Respiratory Culture 1.7- flu  ANTIBIOTICS: 03/09/13 Vancomycin >>> 03/09/13 Ceftazidime >>>> 1/7 tamiflu (dose adjust)>>>  HISTORY OF PRESENT ILLNESS:  SALIOU BARNIER is a 63 year old male with PMH of Systolic CHF (EF 76-16% in July 14), DMT2, HTN, CKD Stage 3 who presented to the ED for a few days of worsening fatigue and cough.  He lives with his niece who reports he is rarely outside of the house and no sick contacts.  She also reports his mental status has been altered for the past day.  In the ED a CXR was suggestive of PNA/ARDS. Labs showed multiorgan failure.  He was intubated in the ED and critical care was asked to admit.  Patient had recent hospitalization in October with SNF rehab following (discharged on 01/21/13).  HIV negative in August 2014. Patient received calcium gluconate, insulin and D50 in ED to treat hyperkalemia.  PAST MEDICAL HISTORY :  Past Medical History  Diagnosis Date  . Diabetes mellitus without complication   . CHF (congestive heart failure)   . Hypertension   . Cardiomyopathy, dilated   . Small bowel obstruction 10/2012  . COPD (chronic obstructive pulmonary disease)   . Herpes     BUTT AND BACK- 9/23 HEALED  . Atrial flutter by electrocardiogram 11/2012  . GERD (gastroesophageal reflux disease)   . Gout    Past Surgical History  Procedure  Laterality Date  . Laparoscopic incisional / umbilical / ventral hernia repair     Prior to Admission medications   Medication Sig Start Date End Date Taking? Authorizing Provider  allopurinol (ZYLOPRIM) 100 MG tablet Take 1 tablet (100 mg total) by mouth daily. 10/20/12   Tiffany L Reed, DO  amiodarone (PACERONE) 200 MG tablet Take 1 tablet (200 mg total) by mouth daily. 09/24/12   Reyne Dumas, MD  apixaban (ELIQUIS) 5 MG TABS tablet Take 1 tablet (5 mg total) by mouth every 12 (twelve) hours. 12/02/12   Charlynne Cousins, MD  aspirin EC 81 MG tablet Take 81 mg by mouth daily.    Historical Provider, MD  carvedilol (COREG) 3.125 MG tablet Take 1 tablet (3.125 mg total) by mouth 2 (two) times daily with a meal. 12/02/12   Charlynne Cousins, MD  colchicine 0.6 MG tablet Take 1 tablet (0.6 mg total) by mouth daily. 10/20/12   Tiffany L Reed, DO  colchicine 0.6 MG tablet Take 1 tablet (0.6 mg total) by mouth daily. 03/01/13   Martie Lee, PA-C  feeding supplement (PRO-STAT SUGAR FREE 64) LIQD Take 30 mLs by mouth 3 (three) times daily with meals.    Historical Provider, MD  fludrocortisone (FLORINEF) 0.1 MG tablet Take 0.2 mg by mouth daily. 12/09/12   Barton Dubois, MD  HYDROcodone-acetaminophen (NORCO/VICODIN) 5-325 MG per tablet Take one tablet by mouth every 6 hours as needed for pain 12/10/12  Tiffany L Reed, DO  ipratropium-albuterol (DUONEB) 0.5-2.5 (3) MG/3ML SOLN Take 3 mLs by nebulization 4 (four) times daily.    Historical Provider, MD  isosorbide dinitrate (ISORDIL) 10 MG tablet Take 10 mg by mouth 2 (two) times daily.    Historical Provider, MD  meloxicam (MOBIC) 15 MG tablet Take 1 tablet (15 mg total) by mouth daily. 03/01/13   Ruthell Rummage Dammen, PA-C  mirtazapine (REMERON) 7.5 MG tablet Take 7.5 mg by mouth at bedtime.    Historical Provider, MD  Multiple Vitamin (MULTIVITAMIN WITH MINERALS) TABS tablet Take 2 tablets by mouth daily.     Historical Provider, MD  omeprazole (PRILOSEC)  40 MG capsule Take 40 mg by mouth daily.    Historical Provider, MD  oxyCODONE-acetaminophen (PERCOCET/ROXICET) 5-325 MG per tablet Take 1-2 tablets by mouth every 6 (six) hours as needed for severe pain. 03/01/13   Ruthell Rummage Dammen, PA-C  potassium chloride SA (K-DUR,KLOR-CON) 20 MEQ tablet Take 40 mEq by mouth daily.    Historical Provider, MD  torsemide (DEMADEX) 20 MG tablet Take 20 mg by mouth daily. 12/09/12   Barton Dubois, MD   Allergies  Allergen Reactions  . Other Nausea And Vomiting    Patient stated drug is "kerein"    FAMILY HISTORY:  Family History  Problem Relation Age of Onset  . Cancer Neg Hx    SOCIAL HISTORY:  reports that he quit smoking about 5 months ago. His smoking use included Cigarettes. He has a .125 pack-year smoking history. He has never used smokeless tobacco. He reports that he does not drink alcohol or use illicit drugs.  REVIEW OF SYSTEMS:  Unable to obtain, patient sedated  SUBJECTIVE: acidotic, foley placed urine noted  VITAL SIGNS: Temp:  [96.6 F (35.9 C)-97.5 F (36.4 C)] 96.6 F (35.9 C) (01/08 0045) Pulse Rate:  [70-85] 70 (01/08 0045) Resp:  [26-30] 30 (01/08 0045) BP: (110-146)/(66-87) 134/75 mmHg (01/08 0045) SpO2:  [77 %-100 %] 100 % (01/08 0045) HEMODYNAMICS:   VENTILATOR SETTINGS: Vent Mode:  [-] PRVC FiO2 (%):  [40 %-100 %] 40 % Set Rate:  [30 bmp-35 bmp] 35 bmp Vt Set:  [500 mL-600 mL] 600 mL PEEP:  [5 cmH20] 5 cmH20 Plateau Pressure:  [13 cmH20-30 cmH20] 30 cmH20 INTAKE / OUTPUT: Intake/Output     01/07 0701 - 01/08 0700   Emesis/NG output 1800   Total Output 1800   Net -1800         PHYSICAL EXAMINATION: General:  Intubated, sedated Neuro:  Sedated rass -4 HEENT:  Vander/AT Cardiovascular:  RRR, no murmur Lungs:  Mild B/L expiratory wheezing Abdomen:  Soft, ND, normoactive bowel sounds Musculoskeletal:  Trace LE edema Skin:  Warm, Dry  LABS:  CBC  Recent Labs Lab 03/09/13 2228  WBC 11.5*  HGB 10.6*  HCT  34.9*  PLT 298   BMET  Recent Labs Lab 03/09/13 2228  NA 144  K 7.3*  CL 105  CO2 11*  BUN 53*  CREATININE 1.97*  GLUCOSE 61*   Electrolytes  Recent Labs Lab 03/09/13 2228  CALCIUM 9.5   Sepsis Markers  Recent Labs Lab 03/09/13 2233  LATICACIDVEN 9.62*   ABG  Recent Labs Lab 03/10/13 0106  PHART 7.183*  PCO2ART 45.6*  PO2ART 280.0*   Liver Enzymes  Recent Labs Lab 03/09/13 2228  AST 484*  ALT 328*  ALKPHOS 165*  BILITOT 1.4*  ALBUMIN 3.0*   Glucose  Recent Labs Lab 03/09/13 2158 03/10/13  GLUCAP 56*  100*    Imaging Dg Chest 2 View  03/09/2013   CLINICAL DATA:  Fatigue and nausea  EXAM: CHEST  2 VIEW  COMPARISON:  12/06/2012  FINDINGS: Chronic cardiopericardial enlargement. Diffuse interstitial coarsening. Streaky lower lung opacities. No effusion. No acute osseous findings.  IMPRESSION: Cardiomegaly and pulmonary edema. Cannot exclude infiltrates at the bases.   Electronically Signed   By: Jorje Guild M.D.   On: 03/09/2013 23:26   Ct Head Wo Contrast  03/09/2013   CLINICAL DATA:  Weakness  EXAM: CT HEAD WITHOUT CONTRAST  TECHNIQUE: Contiguous axial images were obtained from the base of the skull through the vertex without intravenous contrast.  COMPARISON:  Prior CT from 12/10/2012  FINDINGS: Subcentimeter hypodensity within the superior left cerebellar hemisphere again noted, which may represent a small remote infarct, unchanged. There is no acute intracranial hemorrhage or infarct. No mass lesion or midline shift. Gray-white matter differentiation is well maintained. Ventricles are normal in size without evidence of hydrocephalus. CSF containing spaces are within normal limits. No extra-axial fluid collection.  The calvarium is intact.  Orbital soft tissues are within normal limits.  The paranasal sinuses and mastoid air cells are well pneumatized and free of fluid.  Scalp soft tissues are unremarkable.  IMPRESSION: No acute intracranial abnormality.    Electronically Signed   By: Jeannine Boga M.D.   On: 03/09/2013 23:36   Dg Chest Portable 1 View  03/10/2013   CLINICAL DATA:  Hypoxia  EXAM: PORTABLE CHEST - 1 VIEW  COMPARISON:  March 09, 2013  FINDINGS: Endotracheal tube tip is 2.9 cm above the carina. Nasogastric tube tip and side port are below the diaphragm. No pneumothorax.  There is consolidation throughout the left lower lobe. There is patchy infiltrate throughout portions of the left upper lobe. These changes are slightly more prominent on the current examination compared to 1 day prior. There is mild atelectasis in the right base. Right lung is otherwise clear. Generalized cardiomegaly is stable. The pulmonary vascularity is normal. No adenopathy.  IMPRESSION: Increased consolidation on the left, particularly in the left lower lobe. Stable cardiomegaly. Tube positions as described without pneumothorax.   Electronically Signed   By: Lowella Grip M.D.   On: 03/10/2013 01:21    ASSESSMENT / PLAN:  PULMONARY A:ARDS (likely secondary to HCAP vs viral) P:   - ARDS protocol, no permissive hypercapnia until K+ improves - Tamiflu at reduced dose secondary to renal failure until flu ruled out -abg to follow in am  -pcxr in am  -keep plat less 30  -bicarb for ards less ph 7.25 -max high MV  CARDIOVASCULAR A: Chronic Systolic CHF (last EF 52-84% in 08/2012), Severe Sepsis, occult Septic shock, old rbbb P:   - IVF per septic shock protocol with CVP monitoring. - Place A line. - Hold Home BP medications - Insert Central line (HD for possible dialysis), assess cvp -lactic clearance , re assess -if map less 60 , start levophed  RENAL A:  Acute on Chronic Renal Failure (likely secondary to ATN), Hyperkalemia, lactic acidosis, r/o hypovolemia P:   - Place HD catheter for possible dialysis - Repeat BMP now, then q4h pending result -bicarb drip for k  -calcium , Kayexalate ensure given  GASTROINTESTINAL A:  Elevated  transaminases (secondary to septic shock vs acute hepatitis) P:   - Acute hepatitis panel - CMP in AM - Oxcepa feeding per OG. - Protonix IV for stress ulcer prevention -lft in am  -assess amy, lip  HEMATOLOGIC A:  Anemia, dvt prev P:  - AM CBC -sub q hep  INFECTIOUS A:  HCAP, Severe Sepsis, r;o flu P:   - Septic shock protocol phase 2, IVF resuscitation - Vancomycin and Ceftazidime - Follow blood and respiratory cultures - F/U U/A -dose adjust tamiflu  ENDOCRINE A:  Increased AG Metabolic Acidosis secondary to lactic acidosis, DM (diet controlled) P:   -Repeat Lactic acid - CBG monitoring q4 cortisol if drops bp  NEUROLOGIC  A:  Enceph, vent dyschrony P:   -Sedation with fentanyl -Versed if needed, may limit with lft -propofol in ED, dc if Bp drops  Global: I have had extensive discussions with family med poa neice We discussed patients current circumstances and organ failures. We also discussed patient's prior wishes under circumstances such as this. Family has decided to NOT perform resuscitation if arrest but to continue current medical support for now. Would want HD if needed also.   Joni Reining, DO PGY-1 Internal Medicine Pager [phone] 03/10/2013, 1:15 AM  I have fully examined this patient and agree with above findings.    And edited in full  Ccm time 50 min   Lavon Paganini. Titus Mould, MD, [place] Pgr: [facility]

## 2013-03-10 NOTE — Progress Notes (Signed)
  Echocardiogram 2D Echocardiogram has been performed.  Mauricio Po 03/10/2013, 3:35 PM

## 2013-03-10 NOTE — Progress Notes (Signed)
ANTIBIOTIC CONSULT NOTE - INITIAL  Pharmacy Consult for Vancocin and Tressie Ellis Indication: rule out pneumonia and rule out sepsis  Allergies  Allergen Reactions  . Other Nausea And Vomiting    Patient stated drug is "kerein"    Patient Measurements: Weight: 245 lb 9.5 oz (111.4 kg)  Vital Signs: Temp: 98.4 F (36.9 C) (01/08 0214) Temp src: Axillary (01/08 0214) BP: 162/84 mmHg (01/08 0339) Pulse Rate: 59 (01/08 0339) Intake/Output from previous day: 01/07 0701 - 01/08 0700 In: -  Out: 2600 [Urine:250; Emesis/NG output:1800] Intake/Output from this shift: Total I/O In: -  Out: 2600 [Urine:250; Emesis/NG output:1800; Other:550]  Labs:  Recent Labs  03/09/13 2228 03/10/13 0315  WBC 11.5* 8.7  HGB 10.6* 8.2*  PLT 298 203  CREATININE 1.97*  --    Estimated Creatinine Clearance: 50.8 ml/min (by C-G formula based on Cr of 1.97).    Microbiology: No results found for this or any previous visit (from the past 720 hour(s)).  Medical History: Past Medical History  Diagnosis Date  . Diabetes mellitus without complication   . CHF (congestive heart failure)   . Hypertension   . Cardiomyopathy, dilated   . Small bowel obstruction 10/2012  . COPD (chronic obstructive pulmonary disease)   . Herpes     BUTT AND BACK- 9/23 HEALED  . Atrial flutter by electrocardiogram 11/2012  . GERD (gastroesophageal reflux disease)   . Gout     Medications:  Prescriptions prior to admission  Medication Sig Dispense Refill  . allopurinol (ZYLOPRIM) 100 MG tablet Take 1 tablet (100 mg total) by mouth daily.  30 tablet  3  . amiodarone (PACERONE) 200 MG tablet Take 1 tablet (200 mg total) by mouth daily.  30 tablet  0  . apixaban (ELIQUIS) 5 MG TABS tablet Take 1 tablet (5 mg total) by mouth every 12 (twelve) hours.  60 tablet    . aspirin EC 81 MG tablet Take 81 mg by mouth daily.      . carvedilol (COREG) 3.125 MG tablet Take 1 tablet (3.125 mg total) by mouth 2 (two) times daily with a  meal.      . colchicine 0.6 MG tablet Take 1 tablet (0.6 mg total) by mouth daily.  30 tablet  3  . colchicine 0.6 MG tablet Take 1 tablet (0.6 mg total) by mouth daily.  20 tablet  0  . feeding supplement (PRO-STAT SUGAR FREE 64) LIQD Take 30 mLs by mouth 3 (three) times daily with meals.      . fludrocortisone (FLORINEF) 0.1 MG tablet Take 0.2 mg by mouth daily.      Marland Kitchen HYDROcodone-acetaminophen (NORCO/VICODIN) 5-325 MG per tablet Take one tablet by mouth every 6 hours as needed for pain  120 tablet  0  . ipratropium-albuterol (DUONEB) 0.5-2.5 (3) MG/3ML SOLN Take 3 mLs by nebulization 4 (four) times daily.      . isosorbide dinitrate (ISORDIL) 10 MG tablet Take 10 mg by mouth 2 (two) times daily.      . meloxicam (MOBIC) 15 MG tablet Take 1 tablet (15 mg total) by mouth daily.  20 tablet  0  . mirtazapine (REMERON) 7.5 MG tablet Take 7.5 mg by mouth at bedtime.      . Multiple Vitamin (MULTIVITAMIN WITH MINERALS) TABS tablet Take 2 tablets by mouth daily.       Marland Kitchen omeprazole (PRILOSEC) 40 MG capsule Take 40 mg by mouth daily.      Marland Kitchen oxyCODONE-acetaminophen (PERCOCET/ROXICET) 5-325 MG  per tablet Take 1-2 tablets by mouth every 6 (six) hours as needed for severe pain.  10 tablet  0  . potassium chloride SA (K-DUR,KLOR-CON) 20 MEQ tablet Take 40 mEq by mouth daily.      Marland Kitchen torsemide (DEMADEX) 20 MG tablet Take 20 mg by mouth daily.       Scheduled:  . aspirin  324 mg Oral NOW   Or  . aspirin  300 mg Rectal NOW  . fentaNYL  50 mcg Intravenous Once  . heparin  5,000 Units Subcutaneous Q8H  . lidocaine (cardiac) 100 mg/67ml      . oseltamivir  30 mg Oral Daily  . pantoprazole (PROTONIX) IV  40 mg Intravenous QHS  . sodium polystyrene  45 g Oral Once  . succinylcholine       Infusions:  . sodium chloride    . sodium chloride 200 mL/hr at 03/10/13 0305  . feeding supplement (OXEPA)    . fentaNYL infusion INTRAVENOUS 50 mcg/hr (03/10/13 0318)  . propofol 40 mcg/kg/min (03/10/13 0252)  .   sodium bicarbonate 150 mEq in sterile water 1000 mL infusion 150 mL/hr at 03/10/13 0250    Assessment: 63yo male c/o weakness, N/V, cough x2d, now w/ multiorgan failure likely d/t sepsis from HCAP, to begin IV ABX.  Goal of Therapy:  Vancomycin trough level 15-20 mcg/ml  Plan:  Rec'd vanc 1g and Maxipime 2g IV in ED; will continue with vancomycin 1500mg  IV Q24H and Fortaz 2g IV Q12H and monitor CBC, Cx, levels prn.  Wynona Neat, PharmD, BCPS  03/10/2013,3:47 AM

## 2013-03-10 NOTE — Progress Notes (Signed)
Name: Caleb Bauer MRN: [id number] DOB: 05-Aug-1950    ADMISSION DATE:  03/09/2013  REFERRING MD :  Criss Alvine  CHIEF COMPLAINT:  Fatigue  BRIEF PATIENT DESCRIPTION: 63 yo male with several days of cough, nausea, vomiting, and weakness.  Brought to ED with lethargy, and required intubation in ED.  PCCM asked to admit with concern for shock.  SIGNIFICANT EVENTS: 1/7 Admit with shock, MODS, ARDS, VDRD 1/8 Off pressors, ARDS protocol d/c'ed  STUDIES:  1/7 CT head >> no acute abnormalities 1/8 Acute hepatitis panel >> negative 1/8 Echo >>   LINES / TUBES: ETT 1/7 >> Rt IJ HD cath 1/7 >> A line 1/7 >> 1/8   CULTURES: Blood 1/7 >> Urine 1/7 >> Sputum 1/7 >>  Pneumococcal Ag 1/8 >> negative Influenza PCR 1/8 >>  ANTIBIOTICS: 1/7 Vancomycin >> 1/7 Ceftazidime >> 1/7 tamiflu (dose adjust) >>  SUBJECTIVE:  Did not tolerate pressure support.  Did not need pressors.  Making urine.  VITAL SIGNS: Temp:  [96.6 F (35.9 C)-98.4 F (36.9 C)] 97.9 F (36.6 C) (01/08 0900) Pulse Rate:  [56-112] 112 (01/08 0837) Resp:  [17-38] 35 (01/08 0900) BP: (110-162)/(66-95) 127/85 mmHg (01/08 0900) SpO2:  [77 %-100 %] 100 % (01/08 0900) Arterial Line BP: (144-165)/(75-85) 146/75 mmHg (01/08 0900) FiO2 (%):  [40 %-100 %] 50 % (01/08 0900) Weight:  [245 lb 9.5 oz (111.4 kg)] 245 lb 9.5 oz (111.4 kg) (01/08 0240) HEMODYNAMICS: CVP:  [0 mmHg-22 mmHg] 16 mmHg VENTILATOR SETTINGS: Vent Mode:  [-] PRVC FiO2 (%):  [40 %-100 %] 50 % Set Rate:  [30 bmp-35 bmp] 35 bmp Vt Set:  [430 mL-600 mL] 460 mL PEEP:  [5 cmH20] 5 cmH20 Plateau Pressure:  [13 cmH20-33 cmH20] 22 cmH20 INTAKE / OUTPUT: Intake/Output     01/07 0701 - 01/08 0700 01/08 0701 - 01/09 0700   I.V. (mL/kg) 1200 (10.8) 1228.2 (11)   IV Piggyback  50   Total Intake(mL/kg) 1200 (10.8) 1278.2 (11.5)   Urine (mL/kg/hr) 385 225 (0.7)   Emesis/NG output 2000    Other 550    Total Output 2935 225   Net -1735 +1053.2           PHYSICAL EXAMINATION: General: no distress Neuro: RASS -2 HEENT:  ETT, OG tubes in place Cardiovascular: regular, no murmur Lungs: decreased breaths sounds Lt base, no wheeze Abdomen: soft, non tender, decreased bowel sounds Musculoskeletal: 1+ edema Skin: no rashes  LABS:  CBC  Recent Labs Lab 03/09/13 2228 03/10/13 0315  WBC 11.5* 8.7  HGB 10.6* 8.2*  HCT 34.9* 26.4*  PLT 298 203   BMET  Recent Labs Lab 03/09/13 2228 03/10/13 0315 03/10/13 0800  NA 144 144 143  K 7.3* 6.0* 5.6*  CL 105 110 109  CO2 11* 18* 21  BUN 53* 55* 54*  CREATININE 1.97* 1.88* 1.79*  GLUCOSE 61* 159* 99   Electrolytes  Recent Labs Lab 03/09/13 2228 03/10/13 0315 03/10/13 0800  CALCIUM 9.5 8.7 8.5   Sepsis Markers  Recent Labs Lab 03/09/13 2233 03/10/13 0315  LATICACIDVEN 9.62* 3.6*   ABG  Recent Labs Lab 03/10/13 0106 03/10/13 0328 03/10/13 0447  PHART 7.183* 7.413 7.322*  PCO2ART 45.6* 30.6* 37.1  PO2ART 280.0* 453.0* 253.0*   Liver Enzymes  Recent Labs Lab 03/09/13 2228 03/10/13 0315  AST 484* 585*  ALT 328* 319*  ALKPHOS 165* 126*  BILITOT 1.4* 1.1  ALBUMIN 3.0* 2.4*   Glucose  Recent Labs Lab 03/09/13 2158  03/10/13 03/10/13 0459 03/10/13 0807  GLUCAP 56* 100* 125* 92    Imaging Dg Chest 2 View  03/09/2013   CLINICAL DATA:  Fatigue and nausea  EXAM: CHEST  2 VIEW  COMPARISON:  12/06/2012  FINDINGS: Chronic cardiopericardial enlargement. Diffuse interstitial coarsening. Streaky lower lung opacities. No effusion. No acute osseous findings.  IMPRESSION: Cardiomegaly and pulmonary edema. Cannot exclude infiltrates at the bases.   Electronically Signed   By: Jorje Guild M.D.   On: 03/09/2013 23:26   Ct Head Wo Contrast  03/09/2013   CLINICAL DATA:  Weakness  EXAM: CT HEAD WITHOUT CONTRAST  TECHNIQUE: Contiguous axial images were obtained from the base of the skull through the vertex without intravenous contrast.  COMPARISON:  Prior CT from  12/10/2012  FINDINGS: Subcentimeter hypodensity within the superior left cerebellar hemisphere again noted, which may represent a small remote infarct, unchanged. There is no acute intracranial hemorrhage or infarct. No mass lesion or midline shift. Gray-white matter differentiation is well maintained. Ventricles are normal in size without evidence of hydrocephalus. CSF containing spaces are within normal limits. No extra-axial fluid collection.  The calvarium is intact.  Orbital soft tissues are within normal limits.  The paranasal sinuses and mastoid air cells are well pneumatized and free of fluid.  Scalp soft tissues are unremarkable.  IMPRESSION: No acute intracranial abnormality.   Electronically Signed   By: Jeannine Boga M.D.   On: 03/09/2013 23:36   Dg Chest Port 1 View  03/10/2013   CLINICAL DATA:  Central line placement.  EXAM: PORTABLE CHEST - 1 VIEW  COMPARISON:  Chest x-ray from the same day at 12:30 a.m.  FINDINGS: Endotracheal and orogastric tubes show no interval displacement. There is a new right IJ catheter which is directed centrally. Rotation limits further localization. Cardiopericardial enlargement. Pulmonary edema and lower lung opacification which may have increased from prior. No pneumothorax.  IMPRESSION: 1. New right IJ catheter.  No pneumothorax. 2. No interval displacement of orogastric and endotracheal tubes. 3. Worsening lower lung aeration, potentially positional.   Electronically Signed   By: Jorje Guild M.D.   On: 03/10/2013 03:33   Dg Chest Portable 1 View  03/10/2013   CLINICAL DATA:  Hypoxia  EXAM: PORTABLE CHEST - 1 VIEW  COMPARISON:  March 09, 2013  FINDINGS: Endotracheal tube tip is 2.9 cm above the carina. Nasogastric tube tip and side port are below the diaphragm. No pneumothorax.  There is consolidation throughout the left lower lobe. There is patchy infiltrate throughout portions of the left upper lobe. These changes are slightly more prominent on the  current examination compared to 1 day prior. There is mild atelectasis in the right base. Right lung is otherwise clear. Generalized cardiomegaly is stable. The pulmonary vascularity is normal. No adenopathy.  IMPRESSION: Increased consolidation on the left, particularly in the left lower lobe. Stable cardiomegaly. Tube positions as described without pneumothorax.   Electronically Signed   By: Lowella Grip M.D.   On: 03/10/2013 01:21    ASSESSMENT / PLAN:  PULMONARY A: Acute respiratory failure with concern for PNA in setting of metabolic acidosis. Reported hx of COPD. P:   -liberalize Vt to 8 cc/kg -f/u CXR -not ready for extubation yet  CARDIOVASCULAR A:  Hypotension >> likely from hypovolemia from gastro-enteritis in setting systolic heart failure and possible sepsis >> never needed pressors. Hx of HTN. P:   -d/c a line -repeat Echo  -decrease IV fluids to 100 ml/hr total for now -hold  amiodarone, eliquis, asa, coreg, isordil, torsemide for now  RENAL A:   Acute kidney injury from hypovolemia. Anion gap lactic acidosis >> improving. Non anion gap acidosis >> improving. Hyperkalemia >> improved. P:   -monitor renal fx, urine outpt, electrolytes -decrease HCO3 gtt to 50 ml/hr -defer dialysis for now  GASTROINTESTINAL A:   Acute gastro-enteritis. Elevated LFT's. Nutrition. Hx of GERD. P:   -f/u LFT's -hold tube feeds for now -protonix for SUP  HEMATOLOGIC A:   Anemia of chronic disease and hemodilution. P:  -f/u CBC -transfuse for Hb < 7 -SQ heparin for DVT prevention  INFECTIOUS A:   Lt lower lobe infiltrate with concern for PNA in nursing home resident.  ?influenza. P:   -continue vancomycin, ceftazidime -continue tamiflu -f/u influenza PCR, cultures  ENDOCRINE A:  Diet controlled diabetes mellitus. Orthostatic hypotension. Hx of gout. P:   -hold outpt florinef for now -f/u cortisol level -hold outpt allopurinol, colchicine for  now  NEUROLOGIC  A:   Acute encephalopathy 2nd to metabolic derangements. P:   -sedation protocol while on vent -hold outpt remeron  Goals of Care >> DNR.  CC time 45 minutes.  Chesley Mires, MD [facility] Pulmonary/Critical Care 03/10/2013, 10:16 AM Pager:  [phone] After 3pm call: [phone]

## 2013-03-10 NOTE — Significant Event (Signed)
Influenza PCR nasal swab negative.  Clinical suspicion for influenza is low.  Will d/c tamiflu and droplet isolation.  Chesley Mires, MD [facility] Pulmonary/Critical Care 03/10/2013, 12:56 PM Pager:  [phone] After 3pm call: [phone]

## 2013-03-10 NOTE — Progress Notes (Signed)
Pt CBG at 2026 was 66, initiated hypoglycemic protocol, gave med per protocol (see MAR). Follow up CBG was 82. Will continue to monitor and treat pt if hypoglycemia returns/persists.  Siomara Burkel GARNER

## 2013-03-10 NOTE — ED Notes (Signed)
EDP, RT, and RN at bedside for intubation

## 2013-03-10 NOTE — Procedures (Signed)
Arterial Catheter Insertion Procedure Note Caleb Bauer [id number] [birth year]  Procedure: Insertion of Arterial Catheter  Indications: Blood pressure monitoring and Frequent blood sampling  Procedure Details Consent: Unable to obtain consent because of patient intubated. Time Out: Verified patient identification, verified procedure, site/side was marked, verified correct patient position, special equipment/implants available, medications/allergies/relevent history reviewed, required imaging and test results available.  Performed  Maximum sterile technique was used including antiseptics, cap, gloves, gown, hand hygiene, mask and sheet. Skin prep: Chlorhexidine; local anesthetic administered 20 gauge catheter was inserted into right radial artery using the Seldinger technique.  Evaluation Blood flow good; BP tracing good. Complications: No apparent complications.   Philomena Doheny 03/10/2013

## 2013-03-10 NOTE — Progress Notes (Signed)
Vent changes were made by Dr. Halford Chessman.  Per RN, MD did not want ABG w/ these vent changes.  No resp distress noted, VSS.

## 2013-03-11 ENCOUNTER — Inpatient Hospital Stay (HOSPITAL_COMMUNITY): Payer: Medicare Other

## 2013-03-11 LAB — COMPREHENSIVE METABOLIC PANEL
ALT: 301 U/L — AB (ref 0–53)
AST: 394 U/L — AB (ref 0–37)
Albumin: 2 g/dL — ABNORMAL LOW (ref 3.5–5.2)
Alkaline Phosphatase: 109 U/L (ref 39–117)
BILIRUBIN TOTAL: 0.7 mg/dL (ref 0.3–1.2)
BUN: 43 mg/dL — ABNORMAL HIGH (ref 6–23)
CHLORIDE: 113 meq/L — AB (ref 96–112)
CO2: 25 mEq/L (ref 19–32)
Calcium: 8 mg/dL — ABNORMAL LOW (ref 8.4–10.5)
Creatinine, Ser: 1.51 mg/dL — ABNORMAL HIGH (ref 0.50–1.35)
GFR, EST AFRICAN AMERICAN: 55 mL/min — AB (ref >90)
GFR, EST NON AFRICAN AMERICAN: 48 mL/min — AB (ref >90)
GLUCOSE: 74 mg/dL (ref 70–99)
Potassium: 4.3 mEq/L (ref 3.7–5.3)
SODIUM: 147 meq/L (ref 137–147)
Total Protein: 6.2 g/dL (ref 6.0–8.3)

## 2013-03-11 LAB — GLUCOSE, CAPILLARY
GLUCOSE-CAPILLARY: 77 mg/dL (ref 70–99)
GLUCOSE-CAPILLARY: 71 mg/dL (ref 70–99)
Glucose-Capillary: 66 mg/dL — ABNORMAL LOW (ref 70–99)
Glucose-Capillary: 67 mg/dL — ABNORMAL LOW (ref 70–99)
Glucose-Capillary: 70 mg/dL (ref 70–99)
Glucose-Capillary: 70 mg/dL (ref 70–99)
Glucose-Capillary: 72 mg/dL (ref 70–99)
Glucose-Capillary: 75 mg/dL (ref 70–99)
Glucose-Capillary: 84 mg/dL (ref 70–99)

## 2013-03-11 LAB — CBC
HCT: 27.3 % — ABNORMAL LOW (ref 39.0–52.0)
Hemoglobin: 8.6 g/dL — ABNORMAL LOW (ref 13.0–17.0)
MCH: 25.7 pg — ABNORMAL LOW (ref 26.0–34.0)
MCHC: 31.5 g/dL (ref 30.0–36.0)
MCV: 81.5 fL (ref 78.0–100.0)
PLATELETS: 175 10*3/uL (ref 150–400)
RBC: 3.35 MIL/uL — ABNORMAL LOW (ref 4.22–5.81)
RDW: 17.3 % — ABNORMAL HIGH (ref 11.5–15.5)
WBC: 5.1 10*3/uL (ref 4.0–10.5)

## 2013-03-11 LAB — MAGNESIUM: MAGNESIUM: 2 mg/dL (ref 1.5–2.5)

## 2013-03-11 LAB — PHOSPHORUS: PHOSPHORUS: 3.6 mg/dL (ref 2.3–4.6)

## 2013-03-11 MED ORDER — DEXTROSE-NACL 5-0.45 % IV SOLN
INTRAVENOUS | Status: DC
  Administered 2013-03-11 – 2013-03-12 (×2): via INTRAVENOUS

## 2013-03-11 MED ORDER — FENTANYL BOLUS VIA INFUSION
50.0000 ug | INTRAVENOUS | Status: DC | PRN
  Filled 2013-03-11: qty 100

## 2013-03-11 MED ORDER — DEXTROSE 50 % IV SOLN
25.0000 mL | Freq: Once | INTRAVENOUS | Status: AC | PRN
  Administered 2013-03-11: 25 mL via INTRAVENOUS

## 2013-03-11 NOTE — Progress Notes (Signed)
A heart healthy diet was ordered for patient. Pt is coughing some with solid foods, seems to do ok with liquids. [facility] RN made aware. Will keep pt NPO until further orders are received.

## 2013-03-11 NOTE — Procedures (Signed)
Extubation Procedure Note  Patient Details:   Name: Caleb Bauer DOB: 1950-04-28 MRN: [id number]   Airway Documentation:      Evaluation  O2 sats: transiently fell during during procedure Complications: Complications of WT88 = 82%, Pt placed on Bipap per MD order. Sp02 = 97% on Bipap Patient did tolerate procedure well. Bilateral Breath Sounds: Diminished Suctioning: Airway Yes  Pt tolerated Cpap /PS wean 5/5. Post extubation Pt Sp02 decreased. Pt placed on Bipap per PRN order. Pt stable & tolerating BiPap well.  [name] Royal 03/11/2013, 11:00 AM

## 2013-03-11 NOTE — Progress Notes (Signed)
[place] Progress Note Patient Name: Caleb Bauer DOB: June 01, 1950 MRN: [id number]  Date of Service  03/11/2013   HPI/Events of Note   Choking & coughing on food  eICU Interventions  Npo Swallow eval in am   Intervention Category Intermediate Interventions: Communication with other healthcare providers and/or family  Jamey Demchak V. 03/11/2013, 6:17 PM

## 2013-03-11 NOTE — Progress Notes (Signed)
Name: Caleb Bauer MRN: [id number] DOB: [birth year]    ADMISSION DATE:  03/09/2013  REFERRING MD :  Regenia Skeeter  CHIEF COMPLAINT:  Fatigue  BRIEF PATIENT DESCRIPTION: 63 yo male with several days of cough, nausea, vomiting, and weakness.  Brought to ED with lethargy, and required intubation in ED.  PCCM asked to admit with concern for shock.  SIGNIFICANT EVENTS: 1/7 Admit with shock, MODS, ARDS, VDRD 1/8 Off pressors, ARDS protocol d/c'ed  STUDIES:  1/7 CT head >> no acute abnormalities 1/8 Acute hepatitis panel >> negative 1/8 Echo >> EF 20 to 25%, diffuse hypokinesis, grade 2 diastolic dysfx, severe LA dilation  LINES / TUBES: ETT 1/7 >> Rt IJ HD cath 1/7 >> A line 1/7 >> 1/8   CULTURES: Blood 1/7 >> Urine 1/7 >> Sputum 1/7 >>  Pneumococcal Ag 1/8 >> negative Influenza PCR 1/8 >> negative  ANTIBIOTICS: 1/7 Vancomycin >> 1/7 Ceftazidime >> 1/7 tamiflu (dose adjust) >> 1/8  SUBJECTIVE:  Tolerating pressure support.  VITAL SIGNS: Temp:  [96.8 F (36 C)-98.6 F (37 C)] 97.7 F (36.5 C) (01/09 0900) Pulse Rate:  [44-66] 66 (01/09 0818) Resp:  [16-22] 16 (01/09 0900) BP: (108-140)/(62-84) 108/62 mmHg (01/09 0901) SpO2:  [96 %-100 %] 100 % (01/09 0900) Arterial Line BP: (142)/(73) 142/73 mmHg (01/08 1000) FiO2 (%):  [40 %] 40 % (01/09 0901) HEMODYNAMICS: CVP:  [11 mmHg-18 mmHg] 11 mmHg VENTILATOR SETTINGS: Vent Mode:  [-] CPAP FiO2 (%):  [40 %] 40 % Set Rate:  [16 bmp] 16 bmp Vt Set:  [640 mL] 640 mL PEEP:  [5 cmH20] 5 cmH20 Pressure Support:  [10 cmH20] 10 cmH20 Plateau Pressure:  [20 cmH20-25 cmH20] 24 cmH20 INTAKE / OUTPUT: Intake/Output     01/08 0701 - 01/09 0700 01/09 0701 - 01/10 0700   I.V. (mL/kg) 4076.9 (36.6) 231.9 (2.1)   NG/GT 60    IV Piggyback 600    Total Intake(mL/kg) 4736.9 (42.5) 231.9 (2.1)   Urine (mL/kg/hr) 2350 (0.9) 50 (0.2)   Emesis/NG output 540 (0.2)    Other     Total Output 2890 50   Net +1846.9 +181.9           PHYSICAL EXAMINATION: General: no distress Neuro: RASS -2 HEENT:  ETT, OG tubes in place Cardiovascular: regular, no murmur Lungs: decreased breaths sounds Lt base, no wheeze Abdomen: soft, non tender, decreased bowel sounds Musculoskeletal: 1+ edema Skin: no rashes  LABS:  CBC  Recent Labs Lab 03/10/13 0315 03/10/13 1600 03/11/13 0158  WBC 8.7 7.0 5.1  HGB 8.2* 8.5* 8.6*  HCT 26.4* 27.3* 27.3*  PLT 203 192 175   BMET  Recent Labs Lab 03/10/13 0800 03/10/13 1600 03/11/13 0158  NA 143 145 147  K 5.6* 4.6 4.3  CL 109 112 113*  CO2 21 23 25   BUN 54* 48* 43*  CREATININE 1.79* 1.62* 1.51*  GLUCOSE 99 69* 74   Electrolytes  Recent Labs Lab 03/10/13 0800 03/10/13 1600 03/11/13 0158  CALCIUM 8.5 7.9* 8.0*  MG  --   --  2.0  PHOS  --   --  3.6   Sepsis Markers  Recent Labs Lab 03/09/13 2233 03/10/13 0315  LATICACIDVEN 9.62* 3.6*   ABG  Recent Labs Lab 03/10/13 0106 03/10/13 0328 03/10/13 0447  PHART 7.183* 7.413 7.322*  PCO2ART 45.6* 30.6* 37.1  PO2ART 280.0* 453.0* 253.0*   Liver Enzymes  Recent Labs Lab 03/09/13 2228 03/10/13 0315 03/11/13 0158  AST 484* 585* 394*  ALT 328* 319* 301*  ALKPHOS 165* 126* 109  BILITOT 1.4* 1.1 0.7  ALBUMIN 3.0* 2.4* 2.0*   Glucose  Recent Labs Lab 03/10/13 1213 03/10/13 2026 03/10/13 2257 03/10/13 2346 03/11/13 0351 03/11/13 0818  GLUCAP 79 66* 82 72 70 70    Imaging Dg Chest 2 View  03/09/2013   CLINICAL DATA:  Fatigue and nausea  EXAM: CHEST  2 VIEW  COMPARISON:  12/06/2012  FINDINGS: Chronic cardiopericardial enlargement. Diffuse interstitial coarsening. Streaky lower lung opacities. No effusion. No acute osseous findings.  IMPRESSION: Cardiomegaly and pulmonary edema. Cannot exclude infiltrates at the bases.   Electronically Signed   By: Jorje Guild M.D.   On: 03/09/2013 23:26   Ct Head Wo Contrast  03/09/2013   CLINICAL DATA:  Weakness  EXAM: CT HEAD WITHOUT CONTRAST  TECHNIQUE:  Contiguous axial images were obtained from the base of the skull through the vertex without intravenous contrast.  COMPARISON:  Prior CT from 12/10/2012  FINDINGS: Subcentimeter hypodensity within the superior left cerebellar hemisphere again noted, which may represent a small remote infarct, unchanged. There is no acute intracranial hemorrhage or infarct. No mass lesion or midline shift. Gray-white matter differentiation is well maintained. Ventricles are normal in size without evidence of hydrocephalus. CSF containing spaces are within normal limits. No extra-axial fluid collection.  The calvarium is intact.  Orbital soft tissues are within normal limits.  The paranasal sinuses and mastoid air cells are well pneumatized and free of fluid.  Scalp soft tissues are unremarkable.  IMPRESSION: No acute intracranial abnormality.   Electronically Signed   By: Jeannine Boga M.D.   On: 03/09/2013 23:36   Dg Chest Port 1 View  03/11/2013   CLINICAL DATA:  Intubated  EXAM: PORTABLE CHEST - 1 VIEW  COMPARISON:  Prior chest x-ray 03/10/2013  FINDINGS: The patient is markedly rotated toward the left. The endotracheal tube is 3.5 cm above the carinal. A nasogastric tube is incompletely imaged. The tip lies off the field of view, presumably within the stomach. Stable position of right IJ non tunneled hemodialysis catheter. The catheter tip projects over the superior cavoatrial junction.  A decreased aeration over the last 24 hr likely secondary to increasing pulmonary edema. Additionally, there are likely bilateral layering effusions which are slightly larger than prior. No pneumothorax.  IMPRESSION: 1. Slight interval worsening of pulmonary edema. 2. Probable bilateral layering pleural effusions slightly larger since the prior chest x-ray. 3. Bibasilar opacities favored to reflect pleural fluid with atelectasis and edema. Superimposed infection is difficult to exclude entirely. 4. Stable and satisfactory support  apparatus.   Electronically Signed   By: Jacqulynn Cadet M.D.   On: 03/11/2013 07:33   Dg Chest Port 1 View  03/10/2013   CLINICAL DATA:  Central line placement.  EXAM: PORTABLE CHEST - 1 VIEW  COMPARISON:  Chest x-ray from the same day at 12:30 a.m.  FINDINGS: Endotracheal and orogastric tubes show no interval displacement. There is a new right IJ catheter which is directed centrally. Rotation limits further localization. Cardiopericardial enlargement. Pulmonary edema and lower lung opacification which may have increased from prior. No pneumothorax.  IMPRESSION: 1. New right IJ catheter.  No pneumothorax. 2. No interval displacement of orogastric and endotracheal tubes. 3. Worsening lower lung aeration, potentially positional.   Electronically Signed   By: Jorje Guild M.D.   On: 03/10/2013 03:33   Dg Chest Portable 1 View  03/10/2013   CLINICAL DATA:  Hypoxia  EXAM: PORTABLE CHEST -  1 VIEW  COMPARISON:  March 09, 2013  FINDINGS: Endotracheal tube tip is 2.9 cm above the carina. Nasogastric tube tip and side port are below the diaphragm. No pneumothorax.  There is consolidation throughout the left lower lobe. There is patchy infiltrate throughout portions of the left upper lobe. These changes are slightly more prominent on the current examination compared to 1 day prior. There is mild atelectasis in the right base. Right lung is otherwise clear. Generalized cardiomegaly is stable. The pulmonary vascularity is normal. No adenopathy.  IMPRESSION: Increased consolidation on the left, particularly in the left lower lobe. Stable cardiomegaly. Tube positions as described without pneumothorax.   Electronically Signed   By: Lowella Grip M.D.   On: 03/10/2013 01:21    ASSESSMENT / PLAN:  PULMONARY A: Acute respiratory failure with concern for PNA in setting of metabolic acidosis. Reported hx of COPD. P:   -plan for extubation 1/9 -f/u CXR -may need BiPAP after extubation  CARDIOVASCULAR A:   Hypotension >> likely from hypovolemia from gastro-enteritis in setting systolic heart failure and possible sepsis >> never needed pressors >> resolved. Hx of HTN with acute on chronic diastolic heart failure. P:   -decrease IV fluid to 40 ml/hr -hold amiodarone, eliquis, asa, coreg, isordil, torsemide for now -may need cardiology to assist with CHF management  RENAL A:   Acute kidney injury from hypovolemia >> improving Anion gap lactic acidosis, Non anion gap acidosis >> resolved. Hyperkalemia >> resolved. P:   -monitor renal fx, urine outpt, electrolytes -d/c HCO3 from IV fluid  GASTROINTESTINAL A:   Acute gastro-enteritis. Elevated LFT's >> improving. Nutrition. Hx of GERD. P:   -f/u LFT's -advance diet after extubation. -protonix for SUP  HEMATOLOGIC A:   Anemia of chronic disease and hemodilution. P:  -f/u CBC -transfuse for Hb < 7 -SQ heparin for DVT prevention  INFECTIOUS A:   Lt lower lobe infiltrate with concern for PNA in nursing home resident. P:   -continue vancomycin, ceftazidime -cultures  ENDOCRINE A:  Diet controlled diabetes mellitus. Orthostatic hypotension. Hx of gout. P:   -hold outpt florinef for now -hold outpt allopurinol, colchicine for now  NEUROLOGIC  A:   Acute encephalopathy 2nd to metabolic derangements. P:   -hold outpt remeron  Goals of Care >> DNR.  CC time 45 minutes.  Chesley Mires, MD [facility] Pulmonary/Critical Care 03/11/2013, 9:40 AM Pager:  [phone] After 3pm call: [phone]

## 2013-03-11 NOTE — Progress Notes (Signed)
Reginal Lutes MD, Dr. Earnest Conroy to make aware of pt's unchanged/sustained bradycardia. Pt BP within normal range, serum K at 1600 = 4.6. Dr. Earnest Conroy advised to run a BMP to recheck serum K level.  Caleb Bauer

## 2013-03-11 NOTE — Progress Notes (Signed)
WUA assessment: medication turned off. After about 10 minutes patient began coughing, trying to sit up in bed, and taking his hands to this ETT. Pt was able to follow commands and nodded appropriately when asked what his name was and where he was at. Medications resumed at 50% until MD can assess the patients.

## 2013-03-11 NOTE — Progress Notes (Signed)
NUTRITION FOLLOW UP  Intervention:   Supplement diet as appropriate  Nutrition Dx:   Inadequate oral intake related to inability to eat as evidenced by NPO status.  New Goal:   Pt to meet >/= 90% of their estimated nutrition needs   Goal: Enteral nutrition to provide 60-70% of estimated calorie needs (22-25 kcals/kg ideal body weight) and 100% of estimated protein needs, based on ASPEN guidelines for permissive underfeeding in critically ill obese individuals; NA  Monitor:   Diet advancement, PO intake, weight trend, labs   Assessment:   Pt admitted in multiorgan failure likely due to septic shock from HCAP. Pt with acute on chronic renal failure and hyperkalemia. HD catheter placed for possible dialysis.  Pt being extubated. Per RN pt alert once sedation removed. No family noted since admission.   Height: Ht Readings from Last 1 Encounters:  03/10/13 6' 0.83" (1.85 m)    Weight Status:   Wt Readings from Last 1 Encounters:  03/10/13 245 lb 9.5 oz (111.4 kg)    Re-estimated needs:  Kcal: 2100-2300 Protein: 100-115 grams Fluid: > 2.1 L/day  Skin: no issues noted  Diet Order:     Intake/Output Summary (Last 24 hours) at 03/11/13 0943 Last data filed at 03/11/13 0900  Gross per 24 hour  Intake 3690.51 ml  Output   2715 ml  Net 975.51 ml    Last BM: PTA   Labs:   Recent Labs Lab 03/10/13 0800 03/10/13 1600 03/11/13 0158  NA 143 145 147  K 5.6* 4.6 4.3  CL 109 112 113*  CO2 21 23 25   BUN 54* 48* 43*  CREATININE 1.79* 1.62* 1.51*  CALCIUM 8.5 7.9* 8.0*  MG  --   --  2.0  PHOS  --   --  3.6  GLUCOSE 99 69* 74    CBG (last 3)   Recent Labs  03/10/13 2346 03/11/13 0351 03/11/13 0818  GLUCAP 72 70 70    Scheduled Meds: . antiseptic oral rinse  15 mL Mouth Rinse QID  . cefTAZidime (FORTAZ)  IV  2 g Intravenous Q12H  . chlorhexidine  15 mL Mouth Rinse BID  . fentaNYL  50 mcg Intravenous Once  . heparin  5,000 Units Subcutaneous Q8H  .  pantoprazole (PROTONIX) IV  40 mg Intravenous QHS  . vancomycin  1,500 mg Intravenous Q24H    Continuous Infusions: . sodium chloride 50 mL/hr at 03/11/13 0900  . feeding supplement (OXEPA) Stopped (03/10/13 1000)  . fentaNYL infusion INTRAVENOUS 50 mcg/hr (03/11/13 0900)  . propofol 20.048 mcg/kg/min (03/11/13 0900)  .  sodium bicarbonate 150 mEq in sterile water 1000 mL infusion 50 mL/hr at 03/11/13 0900    [place], [place], [place] Pager [phone] After Hours Pager

## 2013-03-11 NOTE — Progress Notes (Signed)
Pt. CBG 67. Hypoglycemia protocol initiated. 25ml of dextrose was given. Recheck CBG was 84. Will Continue to monitor.

## 2013-03-12 ENCOUNTER — Inpatient Hospital Stay (HOSPITAL_COMMUNITY): Payer: Medicare Other

## 2013-03-12 DIAGNOSIS — J449 Chronic obstructive pulmonary disease, unspecified: Secondary | ICD-10-CM

## 2013-03-12 DIAGNOSIS — E119 Type 2 diabetes mellitus without complications: Secondary | ICD-10-CM

## 2013-03-12 DIAGNOSIS — N183 Chronic kidney disease, stage 3 unspecified: Secondary | ICD-10-CM

## 2013-03-12 LAB — CBC
HCT: 28.1 % — ABNORMAL LOW (ref 39.0–52.0)
Hemoglobin: 8.8 g/dL — ABNORMAL LOW (ref 13.0–17.0)
MCH: 25.9 pg — ABNORMAL LOW (ref 26.0–34.0)
MCHC: 31.3 g/dL (ref 30.0–36.0)
MCV: 82.6 fL (ref 78.0–100.0)
PLATELETS: 151 10*3/uL (ref 150–400)
RBC: 3.4 MIL/uL — ABNORMAL LOW (ref 4.22–5.81)
RDW: 18 % — ABNORMAL HIGH (ref 11.5–15.5)
WBC: 4.5 10*3/uL (ref 4.0–10.5)

## 2013-03-12 LAB — COMPREHENSIVE METABOLIC PANEL
ALT: 273 U/L — ABNORMAL HIGH (ref 0–53)
AST: 265 U/L — ABNORMAL HIGH (ref 0–37)
Albumin: 2.1 g/dL — ABNORMAL LOW (ref 3.5–5.2)
Alkaline Phosphatase: 104 U/L (ref 39–117)
BILIRUBIN TOTAL: 0.6 mg/dL (ref 0.3–1.2)
BUN: 27 mg/dL — AB (ref 6–23)
CALCIUM: 8.2 mg/dL — AB (ref 8.4–10.5)
CHLORIDE: 112 meq/L (ref 96–112)
CO2: 25 meq/L (ref 19–32)
Creatinine, Ser: 1.22 mg/dL (ref 0.50–1.35)
GFR calc Af Amer: 72 mL/min — ABNORMAL LOW (ref >90)
GFR calc non Af Amer: 62 mL/min — ABNORMAL LOW (ref >90)
Glucose, Bld: 86 mg/dL (ref 70–99)
Potassium: 4.2 mEq/L (ref 3.7–5.3)
Sodium: 148 mEq/L — ABNORMAL HIGH (ref 137–147)
Total Protein: 6.7 g/dL (ref 6.0–8.3)

## 2013-03-12 LAB — GLUCOSE, CAPILLARY
GLUCOSE-CAPILLARY: 126 mg/dL — AB (ref 70–99)
GLUCOSE-CAPILLARY: 105 mg/dL — AB (ref 70–99)
Glucose-Capillary: 101 mg/dL — ABNORMAL HIGH (ref 70–99)
Glucose-Capillary: 78 mg/dL (ref 70–99)
Glucose-Capillary: 81 mg/dL (ref 70–99)
Glucose-Capillary: 97 mg/dL (ref 70–99)

## 2013-03-12 LAB — URINE CULTURE: Colony Count: >=100000

## 2013-03-12 LAB — CULTURE, RESPIRATORY W GRAM STAIN

## 2013-03-12 LAB — CULTURE, RESPIRATORY: CULTURE: NO GROWTH

## 2013-03-12 MED ORDER — ALBUTEROL SULFATE (2.5 MG/3ML) 0.083% IN NEBU: 2.5000 mg | INHALATION_SOLUTION | RESPIRATORY_TRACT | Status: DC | PRN

## 2013-03-12 MED ORDER — HYDRALAZINE HCL 20 MG/ML IJ SOLN: 10.0000 mg | INTRAMUSCULAR | Status: DC | PRN

## 2013-03-12 MED ORDER — LEVOFLOXACIN IN D5W 750 MG/150ML IV SOLN
750.0000 mg | INTRAVENOUS | Status: DC
  Administered 2013-03-12 – 2013-03-14 (×3): 750 mg via INTRAVENOUS
  Filled 2013-03-12 (×3): qty 150

## 2013-03-12 MED ORDER — INSULIN ASPART 100 UNIT/ML ~~LOC~~ SOLN
0.0000 [IU] | Freq: Three times a day (TID) | SUBCUTANEOUS | Status: DC
  Administered 2013-03-12 – 2013-03-13 (×4): 2 [IU] via SUBCUTANEOUS

## 2013-03-12 NOTE — Evaluation (Signed)
Clinical/Bedside Swallow Evaluation Patient Details  Name: Caleb Bauer MRN: [id number] Date of Birth: 1950-11-16  Today's Date: 03/12/2013 Time: 1028-1040 SLP Time Calculation (min): 12 min  Past Medical History:  Past Medical History  Diagnosis Date  . Diabetes mellitus without complication   . CHF (congestive heart failure)   . Hypertension   . Cardiomyopathy, dilated   . Small bowel obstruction 10/2012  . COPD (chronic obstructive pulmonary disease)   . Herpes     BUTT AND BACK- 9/23 HEALED  . Atrial flutter by electrocardiogram 11/2012  . GERD (gastroesophageal reflux disease)   . Gout    Past Surgical History:  Past Surgical History  Procedure Laterality Date  . Laparoscopic incisional / umbilical / ventral hernia repair     HPI:  63 yo male with several days of cough, nausea, vomiting, and weakness.  Brought to ED with lethargy, and required intubation in ED.  PCCM asked to admit with concern for shock.  ETT 1/7 >> 1/9.  DX ARF with possible PNA, hypotension, acute gastro-enteritis.  Pt reports difficulty swallowing 1/9.   Assessment / Plan / Recommendation Clinical Impression  Pt presents with mild dysphonia, wet phonation, but functional swallow.  He reports improvements from yesterday.  Presented with adequate mastication, swift swallow trigger, and no s/s of aspiration.  Mildly impulsive and needs some assist with self-feeding.  Recommend regular diet with thin liquids; meds whole with liquid - give with purees if coughing.  Monitor first few meals and assist with self-feeding if necessary.  No further SLP intervention warranted - will sign-off.           Diet Recommendation Regular;Thin liquid   Liquid Administration via: Cup;Straw Medication Administration: Whole meds with liquid Supervision: Staff to assist with self feeding;Patient able to self feed Compensations: Slow rate;Small sips/bites    Other  Recommendations Oral Care Recommendations: Oral care BID    Follow Up Recommendations  None       Pertinent Vitals/Pain No c/o pain    SLP Swallow Goals   n/a  Swallow Study Prior Functional Status       General Date of Onset: 03/09/13 HPI: 63 yo male with several days of cough, nausea, vomiting, and weakness.  Brought to ED with lethargy, and required intubation in ED.  PCCM asked to admit with concern for shock.  ETT 1/7 >> 1/9.  DX ARF with possible PNA, hypotension, Acute gastro-enteritis.  Pt reports difficulty swallowing. Type of Study: Bedside swallow evaluation Diet Prior to this Study: NPO Temperature Spikes Noted: No Respiratory Status: Nasal cannula History of Recent Intubation: Yes Length of Intubations (days): 2 days Date extubated: 03/11/13 Behavior/Cognition: Alert;Requires cueing Oral Cavity - Dentition: Missing dentition Self-Feeding Abilities: Able to feed self Patient Positioning: Upright in bed Baseline Vocal Quality: Wet Volitional Cough: Strong Volitional Swallow: Able to elicit    Oral/Motor/Sensory Function Overall Oral Motor/Sensory Function: Appears within functional limits for tasks assessed   Ice Chips Ice chips: Within functional limits   Thin Liquid Thin Liquid: Within functional limits Presentation: Cup;Straw    Nectar Thick Nectar Thick Liquid: Not tested   Honey Thick Honey Thick Liquid: Not tested   Puree Puree: Within functional limits Presentation: [place]. [place], [place] CCC/SLP Pager [phone]     Solid: Within functional limits Presentation: Self Fed       Juan Quam Laurice 03/12/2013,10:53 AM

## 2013-03-12 NOTE — Progress Notes (Signed)
Pt transferred via wheelchair to 4E bed 10.  Pt on 4 l [place] O2 and cardiac monitor.  Report given to RN.

## 2013-03-12 NOTE — Progress Notes (Signed)
Name: Caleb Bauer MRN: [id number] DOB: [birth year]    ADMISSION DATE:  03/09/2013  REFERRING MD :  Regenia Skeeter  CHIEF COMPLAINT:  Fatigue  BRIEF PATIENT DESCRIPTION: 63 yo male with several days of cough, nausea, vomiting, and weakness.  Brought to ED with lethargy, and required intubation in ED.  PCCM asked to admit with concern for shock.  SIGNIFICANT EVENTS: 1/7 Admit with shock, MODS, ARDS, VDRD 1/8 Off pressors, ARDS protocol d/c'ed  STUDIES:  1/7 CT head >> no acute abnormalities 1/8 Acute hepatitis panel >> negative 1/8 Echo >> EF 20 to 25%, diffuse hypokinesis, grade 2 diastolic dysfx, severe LA dilation  LINES / TUBES: ETT 1/7 >> 1/9 Rt IJ HD cath 1/7 >> A line 1/7 >> 1/8   CULTURES: Blood 1/7 >> Urine 1/7 >> enterobacter Sputum 1/7 >>  Pneumococcal Ag 1/8 >> negative Influenza PCR 1/8 >> negative  ANTIBIOTICS: 1/7 Vancomycin >> 1/10 1/7 Ceftazidime >> 1/10  1/7 tamiflu (dose adjust) >> 1/8 1/10 Levaquin  SUBJECTIVE:  C/o sore throat.  Had trouble swallowing food yesterday.  Denies chest pain.  Breathing okay.  VITAL SIGNS: Temp:  [97.3 F (36.3 C)-98.8 F (37.1 C)] 98.2 F (36.8 C) (01/10 0600) Pulse Rate:  [48-92] 70 (01/10 0600) Resp:  [13-23] 13 (01/10 0500) BP: (108-146)/(60-92) 146/92 mmHg (01/10 0600) SpO2:  [90 %-100 %] 94 % (01/10 0600) FiO2 (%):  [40 %] 40 % (01/09 1300) Weight:  [250 lb (113.4 kg)] 250 lb (113.4 kg) (01/10 0318) INTAKE / OUTPUT: Intake/Output     01/09 0701 - 01/10 0700 01/10 0701 - 01/11 0700   P.O. 360    I.V. (mL/kg) 1038.5 (9.2)    NG/GT     IV Piggyback 600    Total Intake(mL/kg) 1998.5 (17.6)    Urine (mL/kg/hr) 1625 (0.6)    Emesis/NG output     Total Output 1625     Net +373.5            PHYSICAL EXAMINATION: General: no distress Neuro: normal strength, follows commands HEENT: no sinus tenderness Cardiovascular: regular, no murmur Lungs: decreased breaths sounds Lt base, no wheeze Abdomen: soft,  non tender, decreased bowel sounds Musculoskeletal: no edema Skin: no rashes  LABS:  CBC  Recent Labs Lab 03/10/13 1600 03/11/13 0158 03/12/13 0342  WBC 7.0 5.1 4.5  HGB 8.5* 8.6* 8.8*  HCT 27.3* 27.3* 28.1*  PLT 192 175 151   BMET  Recent Labs Lab 03/10/13 1600 03/11/13 0158 03/12/13 0342  NA 145 147 148*  K 4.6 4.3 4.2  CL 112 113* 112  CO2 23 25 25   BUN 48* 43* 27*  CREATININE 1.62* 1.51* 1.22  GLUCOSE 69* 74 86   Electrolytes  Recent Labs Lab 03/10/13 1600 03/11/13 0158 03/12/13 0342  CALCIUM 7.9* 8.0* 8.2*  MG  --  2.0  --   PHOS  --  3.6  --    Sepsis Markers  Recent Labs Lab 03/09/13 2233 03/10/13 0315  LATICACIDVEN 9.62* 3.6*   ABG  Recent Labs Lab 03/10/13 0106 03/10/13 0328 03/10/13 0447  PHART 7.183* 7.413 7.322*  PCO2ART 45.6* 30.6* 37.1  PO2ART 280.0* 453.0* 253.0*   Liver Enzymes  Recent Labs Lab 03/10/13 0315 03/11/13 0158 03/12/13 0342  AST 585* 394* 265*  ALT 319* 301* 273*  ALKPHOS 126* 109 104  BILITOT 1.1 0.7 0.6  ALBUMIN 2.4* 2.0* 2.1*   Glucose  Recent Labs Lab 03/11/13 1613 03/11/13 1614 03/11/13 1725 03/11/13 1945 03/12/13  0034 03/12/13 0340  GLUCAP 71 67* 84 75 78 81    Imaging Dg Chest Port 1 View  03/11/2013   CLINICAL DATA:  Intubated  EXAM: PORTABLE CHEST - 1 VIEW  COMPARISON:  Prior chest x-ray 03/10/2013  FINDINGS: The patient is markedly rotated toward the left. The endotracheal tube is 3.5 cm above the carinal. A nasogastric tube is incompletely imaged. The tip lies off the field of view, presumably within the stomach. Stable position of right IJ non tunneled hemodialysis catheter. The catheter tip projects over the superior cavoatrial junction.  A decreased aeration over the last 24 hr likely secondary to increasing pulmonary edema. Additionally, there are likely bilateral layering effusions which are slightly larger than prior. No pneumothorax.  IMPRESSION: 1. Slight interval worsening of  pulmonary edema. 2. Probable bilateral layering pleural effusions slightly larger since the prior chest x-ray. 3. Bibasilar opacities favored to reflect pleural fluid with atelectasis and edema. Superimposed infection is difficult to exclude entirely. 4. Stable and satisfactory support apparatus.   Electronically Signed   By: Jacqulynn Cadet M.D.   On: 03/11/2013 07:33    ASSESSMENT / PLAN:  PULMONARY A: Acute respiratory failure with concern for PNA in setting of metabolic acidosis >> extubated 1/9. Reported hx of COPD. Hx of OSA. P:   -oxygen as needed to keep SpO2 > 92% -CPAP qhs -prn BDs -f/u CXR intermittently  CARDIOVASCULAR A:  Hypotension >> likely from hypovolemia from gastro-enteritis in setting systolic heart failure and possible sepsis >> never needed pressors >> resolved. Hx of HTN with acute on chronic diastolic heart failure. Followed by Dr. Einar Gip as outpt. P:   -resume amiodarone, eliquis, asa, coreg, isordil once able to swallow pills -hold torsemide for now -may need cardiology to assist with CHF management  RENAL A:   Acute kidney injury from hypovolemia >> resolved. Anion gap lactic acidosis, Non anion gap acidosis >> resolved. Hyperkalemia >> resolved. P:   -monitor renal fx, urine outpt, electrolytes -d/c foley, and d/c HD cath after placing peripheral IV  GASTROINTESTINAL A:   Acute gastro-enteritis. Elevated LFT's >> improving. Nutrition. Hx of GERD. Dysphagia. P:   -f/u LFT's intermittently -speech to assess swallowing and then advance diet -protonix for SUP  HEMATOLOGIC A:   Anemia of chronic disease and hemodilution. P:  -f/u CBC -transfuse for Hb < 7 -SQ heparin for DVT prevention  INFECTIOUS A:   Lt lower lobe infiltrate with concern for PNA in nursing home resident. Enterobacter UTI P:   -change Abx to levaquin 1/10  ENDOCRINE A:  Diet controlled diabetes mellitus. Orthostatic hypotension. Hx of gout. P:   -hold outpt  florinef for now -hold outpt allopurinol, colchicine for now  NEUROLOGIC  A:   Acute encephalopathy 2nd to metabolic derangements >> resolved. P:   -resume remeron when able to swallow  Goals of Care >> limited resuscitation >> no CPR, no defibrillation.  Transfer to telemetry 1/10.  Chesley Mires, MD [facility] Pulmonary/Critical Care 03/12/2013, 7:08 AM Pager:  [phone] After 3pm call: [phone]

## 2013-03-13 ENCOUNTER — Inpatient Hospital Stay (HOSPITAL_COMMUNITY): Payer: Medicare Other

## 2013-03-13 ENCOUNTER — Encounter: Payer: Self-pay | Admitting: Internal Medicine

## 2013-03-13 LAB — CBC
HEMATOCRIT: 28.5 % — AB (ref 39.0–52.0)
HEMOGLOBIN: 9 g/dL — AB (ref 13.0–17.0)
MCH: 26.3 pg (ref 26.0–34.0)
MCHC: 31.6 g/dL (ref 30.0–36.0)
MCV: 83.3 fL (ref 78.0–100.0)
Platelets: 134 10*3/uL — ABNORMAL LOW (ref 150–400)
RBC: 3.42 MIL/uL — AB (ref 4.22–5.81)
RDW: 18.3 % — ABNORMAL HIGH (ref 11.5–15.5)
WBC: 3.5 10*3/uL — ABNORMAL LOW (ref 4.0–10.5)

## 2013-03-13 LAB — BASIC METABOLIC PANEL
BUN: 20 mg/dL (ref 6–23)
CO2: 26 mEq/L (ref 19–32)
Calcium: 8.1 mg/dL — ABNORMAL LOW (ref 8.4–10.5)
Chloride: 109 mEq/L (ref 96–112)
Creatinine, Ser: 1.24 mg/dL (ref 0.50–1.35)
GFR calc Af Amer: 70 mL/min — ABNORMAL LOW (ref >90)
GFR, EST NON AFRICAN AMERICAN: 61 mL/min — AB (ref >90)
Glucose, Bld: 127 mg/dL — ABNORMAL HIGH (ref 70–99)
POTASSIUM: 4.2 meq/L (ref 3.7–5.3)
SODIUM: 145 meq/L (ref 137–147)

## 2013-03-13 LAB — GLUCOSE, CAPILLARY
GLUCOSE-CAPILLARY: 124 mg/dL — AB (ref 70–99)
GLUCOSE-CAPILLARY: 102 mg/dL — AB (ref 70–99)
Glucose-Capillary: 122 mg/dL — ABNORMAL HIGH (ref 70–99)
Glucose-Capillary: 79 mg/dL (ref 70–99)

## 2013-03-13 MED ORDER — PANTOPRAZOLE SODIUM 40 MG PO TBEC
40.0000 mg | DELAYED_RELEASE_TABLET | Freq: Every day | ORAL | Status: DC
Start: 2013-03-13 — End: 2013-03-15
  Administered 2013-03-13 – 2013-03-14 (×2): 40 mg via ORAL
  Filled 2013-03-13 (×2): qty 1

## 2013-03-13 MED ORDER — BIOTENE DRY MOUTH MT LIQD
15.0000 mL | Freq: Two times a day (BID) | OROMUCOSAL | Status: DC
  Administered 2013-03-13 – 2013-03-15 (×3): 15 mL via OROMUCOSAL

## 2013-03-13 NOTE — Progress Notes (Signed)
Pt. Refused cpap. RT informed pt. To notify if he changes his mind. 

## 2013-03-13 NOTE — Progress Notes (Signed)
Name: Caleb Bauer MRN: [id number] DOB: January 24, 1951    ADMISSION DATE:  03/09/2013  REFERRING MD :  Regenia Skeeter  CHIEF COMPLAINT:  Fatigue  BRIEF PATIENT DESCRIPTION: 63 yo male with several days of cough, nausea, vomiting, and weakness.  Brought to ED with lethargy, and required intubation in ED.  PCCM asked to admit with concern for shock.  SIGNIFICANT EVENTS: 1/7 Admit with shock, MODS, ARDS, VDRD 1/8 Off pressors, ARDS protocol d/c'ed  STUDIES:  1/7 CT head >> no acute abnormalities 1/8 Acute hepatitis panel >> negative 1/8 Echo >> EF 20 to 25%, diffuse hypokinesis, grade 2 diastolic dysfx, severe LA dilation  LINES / TUBES: ETT 1/7 >> 1/9 Rt IJ HD cath 1/7 >> A line 1/7 >> 1/8   CULTURES: Blood 1/7 >> Urine 1/7 >> enterobacter Sputum 1/7 >>  Pneumococcal Ag 1/8 >> negative Influenza PCR 1/8 >> negative  ANTIBIOTICS: 1/7 Vancomycin >> 1/10 1/7 Ceftazidime >> 1/10  1/7 tamiflu (dose adjust) >> 1/8 1/10 Levaquin  SUBJECTIVE:  Thinks still more DOE than his baseline, but little cough and no phlegm. No chest pain.  VITAL SIGNS: Temp:  [97.8 F (36.6 C)-98.1 F (36.7 C)] 98.1 F (36.7 C) (01/11 0536) Pulse Rate:  [65-75] 75 (01/11 0536) Resp:  [20] 20 (01/11 0536) BP: (134-145)/(83-92) 145/92 mmHg (01/11 0536) SpO2:  [94 %-100 %] 94 % (01/11 0536) Weight:  [107.366 kg (236 lb 11.2 oz)-111.2 kg (245 lb 2.4 oz)] 111.2 kg (245 lb 2.4 oz) (01/11 0536) INTAKE / OUTPUT: Intake/Output     01/10 0701 - 01/11 0700 01/11 0701 - 01/12 0700   P.O. 360    I.V. (mL/kg) 785.3 (7.1)    IV Piggyback 100    Total Intake(mL/kg) 1245.3 (11.2)    Urine (mL/kg/hr) 1160 (0.4)    Total Output 1160     Net +85.3          Stool Occurrence 1 x      PHYSICAL EXAMINATION: General: no distress Neuro: normal strength, follows commands.  HEENT: no sinus tenderness Cardiovascular: regular, no murmur Lungs: decreased but present breaths sounds Lt base, no wheeze, rhonchi, or  cough with deep breath Abdomen: soft, non tender, BS+ Musculoskeletal: no edema Skin: no rashes. Dry and excoriated.  LABS:  CBC  Recent Labs Lab 03/11/13 0158 03/12/13 0342 03/13/13 0411  WBC 5.1 4.5 3.5*  HGB 8.6* 8.8* 9.0*  HCT 27.3* 28.1* 28.5*  PLT 175 151 134*   BMET  Recent Labs Lab 03/11/13 0158 03/12/13 0342 03/13/13 0411  NA 147 148* 145  K 4.3 4.2 4.2  CL 113* 112 109  CO2 25 25 26   BUN 43* 27* 20  CREATININE 1.51* 1.22 1.24  GLUCOSE 74 86 127*   Electrolytes  Recent Labs Lab 03/11/13 0158 03/12/13 0342 03/13/13 0411  CALCIUM 8.0* 8.2* 8.1*  MG 2.0  --   --   PHOS 3.6  --   --    Sepsis Markers  Recent Labs Lab 03/09/13 2233 03/10/13 0315  LATICACIDVEN 9.62* 3.6*   ABG  Recent Labs Lab 03/10/13 0106 03/10/13 0328 03/10/13 0447  PHART 7.183* 7.413 7.322*  PCO2ART 45.6* 30.6* 37.1  PO2ART 280.0* 453.0* 253.0*   Liver Enzymes  Recent Labs Lab 03/10/13 0315 03/11/13 0158 03/12/13 0342  AST 585* 394* 265*  ALT 319* 301* 273*  ALKPHOS 126* 109 104  BILITOT 1.1 0.7 0.6  ALBUMIN 2.4* 2.0* 2.1*   Glucose  Recent Labs Lab 03/12/13 0340 03/12/13  4403 03/12/13 1220 03/12/13 1645 03/12/13 2138 03/13/13 0533  GLUCAP 81 101* 97 126* 105* 124*    Imaging Dg Chest Port 1 View  03/12/2013   CLINICAL DATA:  Extubation.  followup CHF.  EXAM: PORTABLE CHEST - 1 VIEW  COMPARISON:  Portable examinations yesterday an 03/10/2013. Two-view chest x-ray 03/09/2013.  FINDINGS: Extubation and nasogastric tube removal. Right jugular central venous catheter tip projects over the lower SVC. No significant change since yesterday in interstitial and airspace pulmonary edema throughout both lungs. Stable dense left lower lobe atelectasis. No new pulmonary parenchymal abnormalities.  IMPRESSION: Stable severe CHF and/or fluid overload post extubation. Stable dense left lower lobe atelectasis. No new abnormalities.   Electronically Signed   By: Evangeline Dakin M.D.   On: 03/12/2013 08:32    ASSESSMENT / PLAN:  PULMONARY A: Acute respiratory failure with concern for PNA in setting of metabolic acidosis >> extubated 1/9. Reported hx of COPD. Hx of OSA. P:   -oxygen as needed to keep SpO2 > 92% -CPAP qhs- He doesn't use at home and doesn't want it here. -prn BDs - 2V CXR for better eval persistent LLL atelectasis  CARDIOVASCULAR A:  Hypotension >> likely from hypovolemia from gastro-enteritis in setting systolic heart failure and possible sepsis >> never needed pressors >> resolved. Hx of HTN with acute on chronic diastolic heart failure. Still significant CHF by CXR Followed by Dr. Einar Gip as outpt. P:   -resume amiodarone, eliquis, asa, coreg, isordil once able to swallow pills -hold torsemide for now -may need cardiology to assist with CHF management  RENAL A:   Acute kidney injury from hypovolemia >> resolved. Anion gap lactic acidosis, Non anion gap acidosis >> resolved. Hyperkalemia >> resolved. P:   -monitor renal fx, urine outpt, electrolytes -d/cd foley, and d/c HD cath after placing peripheral IV  GASTROINTESTINAL A:   Acute gastro-enteritis. Elevated LFT's >> improving. Nutrition. Hx of GERD. Dysphagia. P:   -f/u LFT's intermittently -speech to assess swallowing and then advance diet -protonix for SUP  HEMATOLOGIC A:   Anemia of chronic disease and hemodilution. Slow improvement P:  -f/u CBC -transfuse for Hb < 7 -SQ heparin for DVT prevention  INFECTIOUS A:   Lt lower lobe infiltrate with concern for PNA in nursing home resident. Enterobacter UTI P:   -change Abx to levaquin 1/10  ENDOCRINE A:  Diet controlled diabetes mellitus. Orthostatic hypotension. Hx of gout. P:   -hold outpt florinef for now -hold outpt allopurinol, colchicine for now  NEUROLOGIC  A:   Acute encephalopathy 2nd to metabolic derangements >> resolved. P:   -resume remeron when able to swallow  Goals of Care >>  limited resuscitation >> no CPR, no defibrillation.  Transfer to telemetry 1/10.  CD Annamaria Boots, MD [facility] Pulmonary/Critical Care 03/13/2013, 10:12 AM Pager:  [phone]   [phone] After 3pm call: [phone]

## 2013-03-14 DIAGNOSIS — G9340 Encephalopathy, unspecified: Secondary | ICD-10-CM

## 2013-03-14 HISTORY — DX: Encephalopathy, unspecified: G93.40

## 2013-03-14 LAB — GLUCOSE, CAPILLARY
GLUCOSE-CAPILLARY: 80 mg/dL (ref 70–99)
GLUCOSE-CAPILLARY: 102 mg/dL — AB (ref 70–99)
GLUCOSE-CAPILLARY: 94 mg/dL (ref 70–99)
GLUCOSE-CAPILLARY: 122 mg/dL — AB (ref 70–99)

## 2013-03-14 MED ORDER — LEVOFLOXACIN 750 MG PO TABS
750.0000 mg | ORAL_TABLET | Freq: Every day | ORAL | Status: DC
  Filled 2013-03-14: qty 1

## 2013-03-14 MED ORDER — LEVOFLOXACIN 750 MG PO TABS
750.0000 mg | ORAL_TABLET | Freq: Every day | ORAL | Status: DC
  Administered 2013-03-15: 750 mg via ORAL
  Filled 2013-03-14: qty 1

## 2013-03-14 MED ORDER — TORSEMIDE 20 MG PO TABS
20.0000 mg | ORAL_TABLET | Freq: Every day | ORAL | Status: DC
  Administered 2013-03-14 – 2013-03-15 (×2): 20 mg via ORAL
  Filled 2013-03-14 (×2): qty 1

## 2013-03-14 MED ORDER — SPIRONOLACTONE 25 MG PO TABS
25.0000 mg | ORAL_TABLET | Freq: Every day | ORAL | Status: DC
  Administered 2013-03-14 – 2013-03-15 (×2): 25 mg via ORAL
  Filled 2013-03-14 (×2): qty 1

## 2013-03-14 NOTE — Progress Notes (Signed)
Report given to receiving RN. Patient is stable with no verbal complaints nor signs or symptoms of distress or discomfort.  

## 2013-03-14 NOTE — Discharge Summary (Signed)
Caleb Bauer       Patient ID: Caleb Bauer MRN: [id number] DOB/AGE: Aug 23, 1950 63 y.o.  Admit date: 03/09/2013 Discharge date: 03/15/2013  Discharge Diagnoses:  Active Problems:   Acute on chronic combined systolic and diastolic congestive heart failure, NYHA class 4   Hypertension   Diabetes mellitus without complication   Abnormal transaminases   Iron deficiency anemia   Acute respiratory failure   Sepsis due to undetermined organism with acute respiratory failure   Encephalopathy acute   Detailed Hospital Course:   Caleb Bauer is a 63 year old male with PMH of Systolic CHF (EF 0000000 in July 14), DMT2, HTN, CKD Stage 3 who presented to the ED on 1/7 for a few days of worsening nausea, vomiting, fatigue and cough. He lives with his niece who reports he is rarely outside of the house and no sick contacts. She also reported his mental status had been altered for the past day. In the ED a CXR was suggestive of PNA/ARDS. Labs showed multiorgan failure. He was intubated in the ED and critical care was asked to admit. Patient had recent hospitalization in October with SNF rehab following (discharged on 01/21/13). HIV negative in August 2014.  Patient received calcium gluconate, insulin and D50 in ED to treat hyperkalemia.   He was admitted to the intensive care with working diagnoses: acute respiratory failure in setting of ARDS vs viral pneumonitis, severe sepsis, acute on chronic renal failure, felt to be due to ATN, shock liver, and acute encephalopathy. Treatment included: mechanical ventilation, IV sedation, we obtained several cultures including: sputum, blood, urine, and viral panel. He was started on broad spectrum antibiotics, and dose adjusted tamiflu.   He rapidly improved with the above therapy and His hypotension responded to IV fluids and improved from a pulmonary stand-point where he was actually extubated on 1/9. At this point we felt that the source of his  sepsis was from urinary tract source as his urine grew out enterococcus, and Pneumonia (NOS). All other culture data was negative.  His renal function improved, as did his LFTs. He was moved to the telemetry ward and we slowly monitored him as we stepped down therapy and added back his antihypertensives and diuretics that he was previously on for management of his heart failure. He was deemed ready for d/c as of 1/13. His final CXR was essentially unchanged with the exception of what appeared to be some increase in his left lower lobe atelectasis.  He will be discharged to home with the plan as outlined below.     Discharge Plan by diagnoses  Acute respiratory failure in setting of urinary tract sepsis, CAP (NOS) +/- pulmonary edema Hx of OSA.  Left Lower lobe atelectasis  P:  -complete 10d total abx  -add back demadex and spironolactone -f/u our office for CXR   Enterococcus UTI  P:  levaquin   Systolic heart failure  Hx of HTN with acute on chronic diastolic heart failure.  P:  -resume amiodarone, eliquis, asa, coreg, isordil once able to swallow pills  -resume demadex an spironolactone (his K was on high side so did not resume this).  - staff note: d/w Dr Einar Gip: patient under impression he is due to ICD placement later in month but per DR [facility] patient does not meet criteria. Dr Einar Gip will followup patient and advise   Anemia of chronic disease and hemodilution. Slow improvement  P:  -f/u CBC  -ok to resume his asa and  eloquis   Diet controlled diabetes mellitus.  Hx of gout.  P:  -resume outpt allopurinol, colchicine  -resume diet   Significant Hospital tests/ studies/ interventions and procedures  STUDIES:  1/7 CT head >> no acute abnormalities  1/8 Acute hepatitis panel >> negative  1/8 Echo >> EF 20 to 25%, diffuse hypokinesis, grade 2 diastolic dysfx, severe LA dilation   LINES / TUBES:  ETT 1/7 >> 1/9  Rt IJ HD cath 1/7 >> out  A line 1/7 >> 1/8   CULTURES:    Blood 1/7 >> neg  Urine 1/7 >> enterobacter  Sputum 1/7 >> neg  Pneumococcal Ag 1/8 >> negative  Influenza PCR 1/8 >> negative   ANTIBIOTICS:  1/7 Vancomycin >> 1/10  1/7 Ceftazidime >> 1/10  1/7 tamiflu (dose adjust) >> 1/8  1/10 Levaquin>>>   Discharge Exam: BP 139/99  Pulse 83  Temp(Src) 97.6 F (36.4 C) (Oral)  Resp 20  Ht 6\' 3"  (1.905 m)  Wt 103.919 kg (229 lb 1.6 oz)  BMI 28.64 kg/m2  SpO2 96% Room air  PHYSICAL EXAMINATION:  General: no distress, feeling better  Neuro: normal strength, follows commands.  HEENT: no sinus tenderness  Cardiovascular: regular, no murmur  Lungs: crackles in bases  Abdomen: soft, non tender, BS+  Musculoskeletal: no edema  Skin: no rashes. Dry and excoriated.   Labs at discharge Lab Results  Component Value Date   CREATININE 1.29 03/15/2013   BUN 21 03/15/2013   NA 145 03/15/2013   K 4.7 03/15/2013   CL 105 03/15/2013   CO2 25 03/15/2013   Lab Results  Component Value Date   WBC 3.5* 03/13/2013   HGB 9.0* 03/13/2013   HCT 28.5* 03/13/2013   MCV 83.3 03/13/2013   PLT 134* 03/13/2013   Lab Results  Component Value Date   ALT 273* 03/12/2013   AST 265* 03/12/2013   ALKPHOS 104 03/12/2013   BILITOT 0.6 03/12/2013   Lab Results  Component Value Date   INR 1.70* 09/14/2012   INR 3.06* 09/12/2012    Current radiology studies Dg Chest 2 View  03/13/2013   CLINICAL DATA:  CHF.  EXAM: CHEST  2 VIEW  COMPARISON:  03/12/2013  FINDINGS: The heart is enlarged but stable. The lungs show improved aeration with better lung volumes and more aerated lung. There are persistent patchy bilateral airspace opacities which could be residual edema or infiltrates. Persistent left lower lobe process with airspace consolidation and pleural effusion.  IMPRESSION: Improved lung aeration with probable resolving pulmonary edema.  Patchy bilateral airspace opacities and persistent left lower lobe process.   Electronically Signed   By: Kalman Jewels M.D.   On:  03/13/2013 13:50   Dg Chest Port 1 View  03/15/2013   CLINICAL DATA:  Pulmonary edema  EXAM: PORTABLE CHEST - 1 VIEW  COMPARISON:  03/13/2013  FINDINGS: Mild progression of bilateral airspace disease. This may represent edema. There is atelectasis in the lung bases with a probable small left effusion. Cardiac enlargement is present.  IMPRESSION: Worsening bilateral airspace disease. Differential includes pulmonary edema and pulmonary infection.   Electronically Signed   By: Franchot Gallo M.D.   On: 03/15/2013 07:42    Disposition:  01-Home or Self Care      Discharge Orders   Future Appointments Provider Department Dept Phone   03/23/2013 11:30 AM Melvenia Needles, NP [facility] [phone]   03/28/2013 9:15 AM Deboraha Sprang, MD [facility]  [phone]   Future Orders Complete By Expires   Diet - low sodium heart healthy  As directed    Increase activity slowly  As directed        Medication List    STOP taking these medications       potassium chloride SA 20 MEQ tablet  Commonly known as:  K-DUR,KLOR-CON      TAKE these medications       allopurinol 100 MG tablet  Commonly known as:  ZYLOPRIM  Take 1 tablet (100 mg total) by mouth daily.     amiodarone 200 MG tablet  Commonly known as:  PACERONE  Take 1 tablet (200 mg total) by mouth daily.     apixaban 5 MG Tabs tablet  Commonly known as:  ELIQUIS  Take 1 tablet (5 mg total) by mouth every 12 (twelve) hours.     aspirin EC 81 MG tablet  Take 81 mg by mouth daily.     carvedilol 3.125 MG tablet  Commonly known as:  COREG  Take 1 tablet (3.125 mg total) by mouth 2 (two) times daily with a meal.     colchicine 0.6 MG tablet  Take 1 tablet (0.6 mg total) by mouth daily.     isosorbide dinitrate 10 MG tablet  Commonly known as:  ISORDIL  Take 10 mg by mouth 2 (two) times daily.     levofloxacin 750 MG tablet  Commonly known as:  LEVAQUIN  Take 1 tablet (750 mg total) by mouth  daily.     lisinopril 2.5 MG tablet  Commonly known as:  PRINIVIL,ZESTRIL  Take 2.5 mg by mouth daily.     meloxicam 15 MG tablet  Commonly known as:  MOBIC  Take 1 tablet (15 mg total) by mouth daily.     mirtazapine 7.5 MG tablet  Commonly known as:  REMERON  Take 7.5 mg by mouth at bedtime.     omeprazole 40 MG capsule  Commonly known as:  PRILOSEC  Take 40 mg by mouth daily.     oxyCODONE-acetaminophen 5-325 MG per tablet  Commonly known as:  PERCOCET/ROXICET  Take 1-2 tablets by mouth every 6 (six) hours as needed for severe pain.     spironolactone 25 MG tablet  Commonly known as:  ALDACTONE  Take 25 mg by mouth daily.     torsemide 20 MG tablet  Commonly known as:  DEMADEX  Take 20 mg by mouth daily.       Follow-up Information   Follow up with Laverda Page, MD On 03/21/2013. (10am )    Specialty:  Cardiology   Contact information:   [place] [place] [address] [phone]       Follow up with PARRETT,TAMMY, NP On 03/23/2013. (report at 10 for check-in, then go for XRAY followed by appointment with Tammy at 1130 )    Specialty:  Nurse Practitioner   Contact information:   [address] [place] [address] [phone]       Discharged Condition: good  Physician Statement:   The Patient was personally examined, the discharge assessment and plan has been personally reviewed and I agree with ACNP Chelsey Kimberley's assessment and plan. > 30 minutes of time have been dedicated to discharge assessment, planning and discharge instructions.   Signed: Kenlie Seki,PETE 03/15/2013, 10:26 AM

## 2013-03-14 NOTE — Evaluation (Signed)
Physical Therapy Evaluation Patient Details Name: Caleb Bauer MRN: [id number] DOB: 12-Jun-1950 Today's Date: 03/14/2013 Time: 0240-9735 PT Time Calculation (min): 21 min  PT Assessment / Plan / Recommendation History of Present Illness  Pt admit with shock, intubated initially and with CHF.  Now extubated.    Clinical Impression  Pt admitted with above. Pt currently with functional limitations due to the deficits listed below (see PT Problem List).  Pt will benefit from skilled PT to increase their independence and safety with mobility to allow discharge to the venue listed below.     PT Assessment  Patient needs continued PT services    Follow Up Recommendations  Home health PT;Supervision - Intermittent                Equipment Recommendations  None recommended by PT         Frequency Min 3X/week    Precautions / Restrictions Precautions Precautions: Fall Restrictions Weight Bearing Restrictions: No   Pertinent Vitals/Pain VSS, No pain      Mobility  Bed Mobility Overal bed mobility: Independent Transfers Overall transfer level: Needs assistance Transfers: Sit to/from Stand Sit to Stand: Min guard Ambulation/Gait Ambulation/Gait assistance: Min guard;Min assist Ambulation Distance (Feet): 150 Feet Assistive device: 1 person hand held assist Gait Pattern/deviations: Step-to pattern;Decreased stride length;Wide base of support Gait velocity interpretation: <1.8 ft/sec, indicative of risk for recurrent falls General Gait Details: Initially pt with shortened step length and needing steadying assist with HHA but pt ambulated better the longer he ambulated.  Cues and guard asssit for overall safety.  Desat to 78% after walk on RA.  Quickly returned to >90% in less than 1 minute.         PT Diagnosis: Generalized weakness  PT Problem List: Decreased activity tolerance;Decreased balance;Decreased mobility;Decreased knowledge of use of DME;Decreased safety  awareness;Cardiopulmonary status limiting activity PT Treatment Interventions: DME instruction;Gait training;Functional mobility training;Therapeutic activities;Therapeutic exercise;Balance training;Patient/family education     PT Goals(Current goals can be found in the care plan section) Acute Rehab PT Goals Patient Stated Goal: go home PT Goal Formulation: With patient Time For Goal Achievement: 03/21/13 Potential to Achieve Goals: Good  Visit Information  Last PT Received On: 03/14/13 Assistance Needed: +1 History of Present Illness: Pt admit with shock, intubated initially and with CHF.  Now extubated.         Prior [place] expects to be discharged to:: Private residence Living Arrangements: Other relatives Available Help at Discharge: Family;Available PRN/intermittently Type of Home: Apartment Home Access: Stairs to enter Entrance Stairs-Number of Steps: 13 Entrance Stairs-Rails: Can reach both Home Layout: One level Home Equipment: Cane - single point Prior Function Level of Independence: Needs assistance Gait / Transfers Assistance Needed: uses cane Comments: Pt reports he does not have a walker even though previous note says he does.  He states he only has a cane.   Communication Communication: No difficulties Dominant Hand: Right    Cognition  Cognition Arousal/Alertness: Awake/alert Behavior During Therapy: WFL for tasks assessed/performed Overall Cognitive Status: Within Functional Limits for tasks assessed    Extremity/Trunk Assessment Upper Extremity Assessment Upper Extremity Assessment: Defer to OT evaluation Lower Extremity Assessment Lower Extremity Assessment: Generalized weakness Cervical / Trunk Assessment Cervical / Trunk Assessment: Normal   Balance Balance Overall balance assessment: Needs assistance Standing balance support: Single extremity supported;During functional activity Standing balance-Leahy Scale:  Fair  End of Session PT - End of Session Equipment Utilized During Treatment: Gait belt  Activity Tolerance: Patient limited by fatigue Patient left: in bed;with call bell/phone within reach Nurse Communication: Mobility status     INGOLD,Caleb Bauer 03/14/2013, 3:47 PM [facility] [phone] (pager)

## 2013-03-14 NOTE — Progress Notes (Signed)
Name: Caleb Bauer MRN: [id number] DOB: March 01, 1951    ADMISSION DATE:  03/09/2013  REFERRING MD :  Regenia Skeeter  CHIEF COMPLAINT:  Fatigue  BRIEF PATIENT DESCRIPTION: 63 yo male with several days of cough, nausea, vomiting, and weakness.  Brought to ED with lethargy, and required intubation in ED.  PCCM asked to admit with concern for shock.   SIGNIFICANT EVENTS: 1/7 Admit with shock, MODS, ARDS, VDRD 1/8 Off pressors, ARDS protocol d/c'ed  STUDIES:  1/7 CT head >> no acute abnormalities 1/8 Acute hepatitis panel >> negative 1/8 Echo >> EF 20 to 25%, diffuse hypokinesis, grade 2 diastolic dysfx, severe LA dilation  LINES / TUBES: ETT 1/7 >> 1/9 Rt IJ HD cath 1/7 >> A line 1/7 >> 1/8   CULTURES: Blood 1/7 >> Urine 1/7 >> enterobacter Sputum 1/7 >> neg Pneumococcal Ag 1/8 >> negative Influenza PCR 1/8 >> negative  ANTIBIOTICS: 1/7 Vancomycin >> 1/10 1/7 Ceftazidime >> 1/10  1/7 tamiflu (dose adjust) >> 1/8 1/10 Levaquin>>>  SUBJECTIVE:  Almost ready for dc  VITAL SIGNS: Temp:  [96.8 F (36 C)-97.8 F (36.6 C)] 96.8 F (36 C) (01/12 0500) Pulse Rate:  [73-82] 73 (01/12 0500) Resp:  [20] 20 (01/11 2100) BP: (131-153)/(84-107) 147/107 mmHg (01/12 0500) SpO2:  [100 %] 100 % (01/12 0500) Weight:  [105.371 kg (232 lb 4.8 oz)] 105.371 kg (232 lb 4.8 oz) (01/12 0500) INTAKE / OUTPUT: Intake/Output     01/11 0701 - 01/12 0700 01/12 0701 - 01/13 0700   P.O. 480    I.V. (mL/kg)     IV Piggyback     Total Intake(mL/kg) 480 (4.6)    Urine (mL/kg/hr) 800 (0.3)    Total Output 800     Net -320            PHYSICAL EXAMINATION: General: no distress Neuro: normal strength, follows commands.  HEENT: no sinus tenderness Cardiovascular: regular, no murmur Lungs: crackles in bases  Abdomen: soft, non tender, BS+ Musculoskeletal: no edema Skin: no rashes. Dry and excoriated.  LABS:  CBC  Recent Labs Lab 03/11/13 0158 03/12/13 0342 03/13/13 0411  WBC 5.1  4.5 3.5*  HGB 8.6* 8.8* 9.0*  HCT 27.3* 28.1* 28.5*  PLT 175 151 134*   BMET  Recent Labs Lab 03/11/13 0158 03/12/13 0342 03/13/13 0411  NA 147 148* 145  K 4.3 4.2 4.2  CL 113* 112 109  CO2 25 25 26   BUN 43* 27* 20  CREATININE 1.51* 1.22 1.24  GLUCOSE 74 86 127*   Electrolytes  Recent Labs Lab 03/11/13 0158 03/12/13 0342 03/13/13 0411  CALCIUM 8.0* 8.2* 8.1*  MG 2.0  --   --   PHOS 3.6  --   --    Sepsis Markers  Recent Labs Lab 03/09/13 2233 03/10/13 0315  LATICACIDVEN 9.62* 3.6*   ABG  Recent Labs Lab 03/10/13 0106 03/10/13 0328 03/10/13 0447  PHART 7.183* 7.413 7.322*  PCO2ART 45.6* 30.6* 37.1  PO2ART 280.0* 453.0* 253.0*   Liver Enzymes  Recent Labs Lab 03/10/13 0315 03/11/13 0158 03/12/13 0342  AST 585* 394* 265*  ALT 319* 301* 273*  ALKPHOS 126* 109 104  BILITOT 1.1 0.7 0.6  ALBUMIN 2.4* 2.0* 2.1*   Glucose  Recent Labs Lab 03/12/13 2138 03/13/13 0533 03/13/13 1224 03/13/13 1634 03/13/13 2134 03/14/13 0610  GLUCAP 105* 124* 102* 122* 79 80    Imaging Dg Chest 2 View  03/13/2013   CLINICAL DATA:  CHF.  EXAM:  CHEST  2 VIEW  COMPARISON:  03/12/2013  FINDINGS: The heart is enlarged but stable. The lungs show improved aeration with better lung volumes and more aerated lung. There are persistent patchy bilateral airspace opacities which could be residual edema or infiltrates. Persistent left lower lobe process with airspace consolidation and pleural effusion.  IMPRESSION: Improved lung aeration with probable resolving pulmonary edema.  Patchy bilateral airspace opacities and persistent left lower lobe process.   Electronically Signed   By: Kalman Jewels M.D.   On: 03/13/2013 13:50  improved aeration, still w/ bilateral patchy airspace disease.  ASSESSMENT / PLAN:   Acute respiratory failure with concern for PNA in setting of metabolic acidosis >> extubated 1/9. Reported hx of COPD. Right > left infiltrates c/w edema +/- PNA  Hx  of OSA. P:   -oxygen as needed to keep SpO2 > 92% -CPAP qhs- He doesn't use at home and doesn't want it here. -prn BDs -complete 10d total abx  -add back demadex   Enterococcus UTI P: levaquin   Hypotension >> likely from hypovolemia from gastro-enteritis in setting systolic heart failure and possible sepsis >> never needed pressors >> resolved. Hx of HTN with acute on chronic diastolic heart failure. Still significant CHF by CXR Followed by Dr. Einar Gip as outpt.  P:   -resume amiodarone, eliquis, asa, coreg, isordil once able to swallow pills -resume demadex an spironolactone  -may need cardiology to assist with CHF management - staff note: d/w Dr Einar Gip: patient under impression he is due to ICD placement later in month but per DR [facility] patient does not meet criteria. Dr Einar Gip will followup patient and advise  Acute kidney injury from hypovolemia >> resolved. Anion gap lactic acidosis, Non anion gap acidosis >> resolved. Hyperkalemia >> resolved. P:   -monitor renal fx, urine outpt, electrolytes -d/cd foley, and d/c HD cath after placing peripheral IV  Acute gastro-enteritis. Elevated LFT's >> improving. Nutrition. Hx of GERD. Dysphagia:  P:   -f/u LFT's intermittently - SLP recs: regular thin liq  -protonix for SUP   Anemia of chronic disease and hemodilution. Slow improvement P:  -f/u CBC -transfuse for Hb < 7 -SQ heparin for DVT prevention   Diet controlled diabetes mellitus. Orthostatic hypotension. Hx of gout. P:   -hold outpt florinef for now -hold outpt allopurinol, colchicine for now   Acute encephalopathy 2nd to metabolic derangements >> resolved. P:   -resume remeron when able to swallow  Goals of Care >> limited resuscitation >> no CPR, no defibrillation.    Note by Marni Griffon NP 8:55am 03/14/2013   STAFF NOTE  slolwy improving. Needs one more day prior to dc. I d;w Dr Einar Gip as above   Dr. Brand Males, M.D., [facility].C.P Pulmonary  and Critical Care Medicine Staff Physician [place] [facility] Pager: [phone], If no answer or between  15:00h - 7:00h: call [phone]  03/14/2013 1:09 PM

## 2013-03-14 NOTE — Progress Notes (Signed)
Spoke with Caleb Bauer about elevated B/P 142/107, stated will address during rounds.

## 2013-03-15 ENCOUNTER — Inpatient Hospital Stay (HOSPITAL_COMMUNITY): Payer: Medicare Other

## 2013-03-15 LAB — BASIC METABOLIC PANEL
BUN: 21 mg/dL (ref 6–23)
CHLORIDE: 105 meq/L (ref 96–112)
CO2: 25 mEq/L (ref 19–32)
Calcium: 8.8 mg/dL (ref 8.4–10.5)
Creatinine, Ser: 1.29 mg/dL (ref 0.50–1.35)
GFR, EST AFRICAN AMERICAN: 67 mL/min — AB (ref >90)
GFR, EST NON AFRICAN AMERICAN: 58 mL/min — AB (ref >90)
Glucose, Bld: 106 mg/dL — ABNORMAL HIGH (ref 70–99)
Potassium: 4.7 mEq/L (ref 3.7–5.3)
SODIUM: 145 meq/L (ref 137–147)

## 2013-03-15 LAB — GLUCOSE, CAPILLARY: Glucose-Capillary: 93 mg/dL (ref 70–99)

## 2013-03-15 MED ORDER — LEVOFLOXACIN 750 MG PO TABS: 750.0000 mg | ORAL_TABLET | Freq: Every day | ORAL | Status: DC

## 2013-03-15 NOTE — Discharge Summary (Signed)
Staff note  Dc today Not an AICD candidate per DR Einar Gip; have fu with DR Einar Gip He has LLL atx today but otherwise looks good for dc; will have NP Tammy do CXR and Post ICU followup as above   Dr. Brand Males, M.D., [facility].C.P Pulmonary and Critical Care Medicine Staff Physician [place] [facility] Pager: [phone], If no answer or between  15:00h - 7:00h: call [phone]  03/15/2013 10:48 AM

## 2013-03-15 NOTE — Progress Notes (Signed)
Chart review complete.  Patient is not eligible for [facility] Care Management services because his/her PCP is not a [facility] primary care provider or is not [facility] affiliated.  Spoke with patient at bedside to confirm his PCP.  He currently is not engaged with at [facility] PCP.  Requested that a PCP list be provided.  For any additional questions or new referrals please contact [facility] Liaison at [phone]

## 2013-03-15 NOTE — Care Management Note (Addendum)
  Page 2 of 2   03/15/2013     12:03:12 PM   CARE MANAGEMENT NOTE 03/15/2013  Patient:  Caleb Bauer, Caleb Bauer   Account Number:  [id number]  Date Initiated:  03/15/2013  Documentation initiated by:  [name]  Subjective/Objective Assessment:   Admitted with COPD, A fib     Action/Plan:   CM will monitor for disposition needs   Anticipated DC Date:  03/15/2013   Anticipated DC Plan:  [place]  CM consult  PCP issues      Choice offered to / List presented to:             Status of service:  Completed, signed off Medicare Important Message given?   (If response is "NO", the following Medicare IM given date fields will be blank) Date Medicare IM given:   Date Additional Medicare IM given:    Discharge Disposition:  HOME/SELF CARE  Per UR Regulation:  Reviewed for med. necessity/level of care/duration of stay  If discussed at [place] of Stay Meetings, dates discussed:    Comments:  03/15/2013 Last UR Date 03/10/2013 Social:  Lives at home with supportive niece. Transportation:  CAB - self arranged by niece SW provided Sweater at time of d/c PCP:  Hx/o none.  CM provided PCP Assistance # for locating PCP Insurance:  MCR CM confirms paitent has transportation to MD appts.  Denies med non-compliance .  Confirms weighs daily, writes down. CM discussed hx/o admissions/SNF admission and importance of maintaining health to avoid financial affects of non-compliance and inelligibility for SNF d/t remaining in 60 days of wellness needed to qualify again. Cariologist appt scheduled - Dr. Einar Gip Disposition:  Home/Self Care - Patient is not homebound. Discharged today [employer] RN, BSN, [place], [place] 03/15/2013

## 2013-03-15 NOTE — Progress Notes (Signed)
DC IV, DC Tele, DC Home. Discharge instructions and home medications discussed with patient. Patient denied and questions or concerns at this time. Patient leaving unit via wheelchair and appears in no acute distress.

## 2013-03-16 LAB — CULTURE, BLOOD (ROUTINE X 2)
Culture: NO GROWTH
Culture: NO GROWTH

## 2013-03-23 ENCOUNTER — Ambulatory Visit (INDEPENDENT_AMBULATORY_CARE_PROVIDER_SITE_OTHER): Payer: Medicare Other | Admitting: Adult Health

## 2013-03-23 ENCOUNTER — Telehealth: Payer: Self-pay | Admitting: Adult Health

## 2013-03-23 ENCOUNTER — Other Ambulatory Visit (INDEPENDENT_AMBULATORY_CARE_PROVIDER_SITE_OTHER): Payer: Medicare Other

## 2013-03-23 ENCOUNTER — Encounter: Payer: Self-pay | Admitting: Adult Health

## 2013-03-23 ENCOUNTER — Ambulatory Visit (INDEPENDENT_AMBULATORY_CARE_PROVIDER_SITE_OTHER)
Admission: RE | Admit: 2013-03-23 | Discharge: 2013-03-23 | Disposition: A | Discharge status: Home / Self Care | Payer: Medicare Other | Source: Ambulatory Visit | Attending: Adult Health | Admitting: Adult Health

## 2013-03-23 VITALS — BP 116/72 | HR 66 | Temp 98.2°F | Ht 73.0 in | Wt 240.4 lb

## 2013-03-23 DIAGNOSIS — I5043 Acute on chronic combined systolic (congestive) and diastolic (congestive) heart failure: Secondary | ICD-10-CM

## 2013-03-23 DIAGNOSIS — A419 Sepsis, unspecified organism: Secondary | ICD-10-CM

## 2013-03-23 DIAGNOSIS — R0600 Dyspnea, unspecified: Secondary | ICD-10-CM

## 2013-03-23 DIAGNOSIS — R6520 Severe sepsis without septic shock: Principal | ICD-10-CM

## 2013-03-23 DIAGNOSIS — R0609 Other forms of dyspnea: Secondary | ICD-10-CM

## 2013-03-23 DIAGNOSIS — J9600 Acute respiratory failure, unspecified whether with hypoxia or hypercapnia: Secondary | ICD-10-CM

## 2013-03-23 DIAGNOSIS — J449 Chronic obstructive pulmonary disease, unspecified: Secondary | ICD-10-CM

## 2013-03-23 DIAGNOSIS — R0989 Other specified symptoms and signs involving the circulatory and respiratory systems: Secondary | ICD-10-CM

## 2013-03-23 DIAGNOSIS — J189 Pneumonia, unspecified organism: Secondary | ICD-10-CM

## 2013-03-23 DIAGNOSIS — I509 Heart failure, unspecified: Secondary | ICD-10-CM

## 2013-03-23 LAB — BASIC METABOLIC PANEL
BUN: 30 mg/dL — ABNORMAL HIGH (ref 6–23)
CHLORIDE: 102 meq/L (ref 96–112)
CO2: 31 mEq/L (ref 19–32)
Calcium: 8.5 mg/dL (ref 8.4–10.5)
Creatinine, Ser: 1.7 mg/dL — ABNORMAL HIGH (ref 0.4–1.5)
GFR: 52 mL/min — AB (ref >60.00)
Glucose, Bld: 82 mg/dL (ref 70–99)
POTASSIUM: 3.8 meq/L (ref 3.5–5.1)
Sodium: 141 mEq/L (ref 135–145)

## 2013-03-23 LAB — BRAIN NATRIURETIC PEPTIDE

## 2013-03-23 NOTE — Telephone Encounter (Signed)
BNP elevated Spoke with TP >> take extra Demedex today.  Keep appt scheduled with Dr Einar Gip for tomorrow 1.22.15 at [facility] x[facility].

## 2013-03-23 NOTE — Progress Notes (Signed)
Subjective:    Patient ID: Caleb Bauer, male    DOB: 02-05-1951, 63 y.o.   MRN: [id number]  HPI 63 yo male admitted 03/09/13 for acute CHF, HCAP, septic shock, VDRF by PCCM .  03/23/2013 [facility] follow up  Pt returns for a post hospital followup. He was admitted January 7 through January 13 for acute on chronic systolic congestive heart failure with an EF of 20-25% on 09/13/2012, hospital acquired pneumonia, septic shock, acute respiratory failure, requiring vent support. He was admitted by the pulmonary critical care team. Patient had multiple hospitalizations over the last 6 months. Greater than 6. Patient had significant hyperkalemia on admission, requiring calcium gluconate, insulin and D50 in ED to treat hyperkalemia.  He was started on broad spectrum antibiotics, and dose adjusted tamiflu.  He rapidly improved with the above therapy and His hypotension responded to IV fluids and improved from a pulmonary stand-point where he was extubated on 1/9. He was found to have A. Enterococcus UTI.  He was discharged on a antibiotic course , which he is now finished.  Since discharge. Patient reports that he is feeling much improved. His shortness, of breath. Has decreased. Patient says that he is no longer smoking.Marland Kitchen He denies any cough, hemoptysis, orthopnea, PND. Legs are swollen  Since discharge. He remains on spironolactone and demadex  Weight is up since discharge Patient has an appointment with cardiology next week.     Review of Systems Constitutional:   No  weight loss, night sweats,  Fevers, chills,  +fatigue, or  lassitude.  HEENT:   No headaches,  Difficulty swallowing,  Tooth/dental problems, or  Sore throat,                No sneezing, itching, ear ache, nasal congestion, post nasal drip,   CV:  No chest pain,  Orthopnea, PND,  ++swelling in lower extremities,  No anasarca, dizziness, palpitations, syncope.   GI  No heartburn, indigestion, abdominal pain, nausea,  vomiting, diarrhea, change in bowel habits, loss of appetite, bloody stools.   Resp   No excess mucus, no productive cough,  No non-productive cough,  No coughing up of blood.  No change in color of mucus.  No wheezing.  No chest wall deformity  Skin: no rash or lesions.  GU: no dysuria, change in color of urine, no urgency or frequency.  No flank pain, no hematuria   MS:  No joint pain or swelling.  No decreased range of motion.  No back pain.  Psych:  No change in mood or affect. No depression or anxiety.  No memory loss.         Objective:   Physical Exam GEN: A/Ox3; pleasant , NAD, elderly , NAD   HEENT:  [place]/AT,  EACs-clear, TMs-wnl, NOSE-clear, THROAT-clear, no lesions, no postnasal drip or exudate noted.   NECK:  Supple w/ fair ROM; no JVD; normal carotid impulses w/o bruits; no thyromegaly or nodules palpated; no lymphadenopathy.  RESP Diminished BS in bases , wheezes/ rales/ or rhonchi.no accessory muscle use, no dullness to percussion  CARD:  RRR, no m/r/g  , 2+ peripheral edema, pulses intact, no cyanosis or clubbing.  GI:   Soft & nt; nml bowel sounds; no organomegaly or masses detected.  Musco: Warm bil, no deformities or joint swelling noted.   Neuro: alert, no focal deficits noted.    Skin: Warm, no lesions or rashes Painted acrylic nails    05/02/58 CXR Persistent bibasilar (left greater than  right) atelectasis and/or  consolidation with small left pleural effusion       Assessment & Plan:

## 2013-03-23 NOTE — Patient Instructions (Signed)
Continue on current regimen  Follow up Dr. Chase Caller in 3-4 weeks with chest xray and PFT  Need follow up this week with cardiology to manage edema .  Labs today .  Please contact office for sooner follow up if symptoms do not improve or worsen or seek emergency care

## 2013-03-23 NOTE — Telephone Encounter (Signed)
LMOM TCB x2 - unable to leave detailed message as this was not a named voicemail but asked pt to take extra demedex today, keep cardiologist appt tomorrow afternoon and call the office when we open at 8am to verify message was received.

## 2013-03-24 DIAGNOSIS — J189 Pneumonia, unspecified organism: Secondary | ICD-10-CM | POA: Insufficient documentation

## 2013-03-24 HISTORY — DX: Pneumonia, unspecified organism: J18.9

## 2013-03-24 NOTE — Telephone Encounter (Signed)
Pt's niece [name] returned call.  Pt did not get his extra Demadex last night, advised [name] to give this medication now.  [name] verbalized her understanding.  Pt to keep 3pm appt with Dr Einar Gip.  Nothing further needed; will sign off.

## 2013-03-24 NOTE — Progress Notes (Signed)
Quick Note:  Per 1.21.15 phone note, pt was called x2 with message left advising pt to take extra Demedex and keep appt with Dr Einar Gip today 1.22.15. Labs and ov note faxed via [employer]. ______

## 2013-03-24 NOTE — Assessment & Plan Note (Signed)
HCAP clinically improved w/ abx course completed.  cXR is improved w/ residual bibasilar atx/consolidation  Will need serial cxxr for clearance   Plan  follow up 3-4 weeks with cxr

## 2013-03-24 NOTE — Assessment & Plan Note (Addendum)
Hx of heavy smoking , No formal dx of COPD - Suspect most of his breathing issues are cardiac in nature  Will have him return for PFT  Congratulated on smoking cessation .

## 2013-03-24 NOTE — Assessment & Plan Note (Signed)
Recent Decompensated CHF now improved  Does have wt gain since discharge  Encouraged on med compliance   Plan  Check bmet /bnp  follow up cardiology as planned next week  Low salt diet  Advised on CHF fluid restrictions.

## 2013-03-28 ENCOUNTER — Ambulatory Visit: Payer: Medicare Other | Admitting: Internal Medicine

## 2013-04-06 ENCOUNTER — Encounter (HOSPITAL_COMMUNITY): Payer: Self-pay | Admitting: Emergency Medicine

## 2013-04-06 DIAGNOSIS — N183 Chronic kidney disease, stage 3 unspecified: Secondary | ICD-10-CM | POA: Diagnosis present

## 2013-04-06 DIAGNOSIS — N189 Chronic kidney disease, unspecified: Principal | ICD-10-CM

## 2013-04-06 DIAGNOSIS — E876 Hypokalemia: Secondary | ICD-10-CM | POA: Diagnosis present

## 2013-04-06 DIAGNOSIS — K219 Gastro-esophageal reflux disease without esophagitis: Secondary | ICD-10-CM | POA: Diagnosis present

## 2013-04-06 DIAGNOSIS — R0902 Hypoxemia: Secondary | ICD-10-CM | POA: Diagnosis present

## 2013-04-06 DIAGNOSIS — Z79899 Other long term (current) drug therapy: Secondary | ICD-10-CM

## 2013-04-06 DIAGNOSIS — I428 Other cardiomyopathies: Secondary | ICD-10-CM | POA: Diagnosis present

## 2013-04-06 DIAGNOSIS — N179 Acute kidney failure, unspecified: Secondary | ICD-10-CM | POA: Diagnosis present

## 2013-04-06 DIAGNOSIS — I452 Bifascicular block: Secondary | ICD-10-CM | POA: Diagnosis present

## 2013-04-06 DIAGNOSIS — Z87891 Personal history of nicotine dependence: Secondary | ICD-10-CM

## 2013-04-06 DIAGNOSIS — Z7901 Long term (current) use of anticoagulants: Secondary | ICD-10-CM

## 2013-04-06 DIAGNOSIS — I130 Hypertensive heart and chronic kidney disease with heart failure and stage 1 through stage 4 chronic kidney disease, or unspecified chronic kidney disease: Principal | ICD-10-CM | POA: Diagnosis present

## 2013-04-06 DIAGNOSIS — M109 Gout, unspecified: Secondary | ICD-10-CM | POA: Diagnosis present

## 2013-04-06 DIAGNOSIS — I5043 Acute on chronic combined systolic (congestive) and diastolic (congestive) heart failure: Secondary | ICD-10-CM | POA: Diagnosis present

## 2013-04-06 DIAGNOSIS — Z7982 Long term (current) use of aspirin: Secondary | ICD-10-CM

## 2013-04-06 DIAGNOSIS — J4489 Other specified chronic obstructive pulmonary disease: Secondary | ICD-10-CM | POA: Diagnosis present

## 2013-04-06 DIAGNOSIS — J449 Chronic obstructive pulmonary disease, unspecified: Secondary | ICD-10-CM | POA: Diagnosis present

## 2013-04-06 DIAGNOSIS — I509 Heart failure, unspecified: Secondary | ICD-10-CM | POA: Diagnosis present

## 2013-04-06 DIAGNOSIS — E119 Type 2 diabetes mellitus without complications: Secondary | ICD-10-CM | POA: Diagnosis present

## 2013-04-06 NOTE — ED Notes (Signed)
Patient presents with c/o right elbow and knee pain, neck pain.  Has noticed swelling to his lower ext.  History of gout

## 2013-04-06 NOTE — ED Notes (Signed)
Family member at nurses station requesting pain medications.

## 2013-04-07 ENCOUNTER — Inpatient Hospital Stay (HOSPITAL_COMMUNITY)
Admission: EM | Admit: 2013-04-07 | Discharge: 2013-04-11 | DRG: 291 | Disposition: A | Discharge status: Home / Self Care | Payer: Medicare Other | Attending: Internal Medicine | Admitting: Internal Medicine

## 2013-04-07 ENCOUNTER — Emergency Department (HOSPITAL_COMMUNITY): Payer: Medicare Other

## 2013-04-07 DIAGNOSIS — E43 Unspecified severe protein-calorie malnutrition: Secondary | ICD-10-CM

## 2013-04-07 DIAGNOSIS — R197 Diarrhea, unspecified: Secondary | ICD-10-CM

## 2013-04-07 DIAGNOSIS — D509 Iron deficiency anemia, unspecified: Secondary | ICD-10-CM

## 2013-04-07 DIAGNOSIS — J189 Pneumonia, unspecified organism: Secondary | ICD-10-CM

## 2013-04-07 DIAGNOSIS — I119 Hypertensive heart disease without heart failure: Secondary | ICD-10-CM | POA: Diagnosis present

## 2013-04-07 DIAGNOSIS — R319 Hematuria, unspecified: Secondary | ICD-10-CM

## 2013-04-07 DIAGNOSIS — Z8679 Personal history of other diseases of the circulatory system: Secondary | ICD-10-CM

## 2013-04-07 DIAGNOSIS — R6520 Severe sepsis without septic shock: Secondary | ICD-10-CM

## 2013-04-07 DIAGNOSIS — E119 Type 2 diabetes mellitus without complications: Secondary | ICD-10-CM | POA: Diagnosis present

## 2013-04-07 DIAGNOSIS — I5041 Acute combined systolic (congestive) and diastolic (congestive) heart failure: Secondary | ICD-10-CM

## 2013-04-07 DIAGNOSIS — R5381 Other malaise: Secondary | ICD-10-CM

## 2013-04-07 DIAGNOSIS — D696 Thrombocytopenia, unspecified: Secondary | ICD-10-CM

## 2013-04-07 DIAGNOSIS — R112 Nausea with vomiting, unspecified: Secondary | ICD-10-CM

## 2013-04-07 DIAGNOSIS — I509 Heart failure, unspecified: Secondary | ICD-10-CM

## 2013-04-07 DIAGNOSIS — I5043 Acute on chronic combined systolic (congestive) and diastolic (congestive) heart failure: Secondary | ICD-10-CM

## 2013-04-07 DIAGNOSIS — J449 Chronic obstructive pulmonary disease, unspecified: Secondary | ICD-10-CM

## 2013-04-07 DIAGNOSIS — M1009 Idiopathic gout, multiple sites: Secondary | ICD-10-CM

## 2013-04-07 DIAGNOSIS — N390 Urinary tract infection, site not specified: Secondary | ICD-10-CM

## 2013-04-07 DIAGNOSIS — N179 Acute kidney failure, unspecified: Secondary | ICD-10-CM

## 2013-04-07 DIAGNOSIS — I1310 Hypertensive heart and chronic kidney disease without heart failure, with stage 1 through stage 4 chronic kidney disease, or unspecified chronic kidney disease: Secondary | ICD-10-CM

## 2013-04-07 DIAGNOSIS — M109 Gout, unspecified: Secondary | ICD-10-CM

## 2013-04-07 DIAGNOSIS — R748 Abnormal levels of other serum enzymes: Secondary | ICD-10-CM

## 2013-04-07 DIAGNOSIS — N183 Chronic kidney disease, stage 3 unspecified: Secondary | ICD-10-CM

## 2013-04-07 DIAGNOSIS — A419 Sepsis, unspecified organism: Secondary | ICD-10-CM

## 2013-04-07 DIAGNOSIS — I4892 Unspecified atrial flutter: Secondary | ICD-10-CM

## 2013-04-07 DIAGNOSIS — N189 Chronic kidney disease, unspecified: Secondary | ICD-10-CM

## 2013-04-07 DIAGNOSIS — I959 Hypotension, unspecified: Secondary | ICD-10-CM

## 2013-04-07 DIAGNOSIS — G9340 Encephalopathy, unspecified: Secondary | ICD-10-CM

## 2013-04-07 DIAGNOSIS — J9600 Acute respiratory failure, unspecified whether with hypoxia or hypercapnia: Secondary | ICD-10-CM

## 2013-04-07 DIAGNOSIS — E860 Dehydration: Secondary | ICD-10-CM

## 2013-04-07 DIAGNOSIS — E876 Hypokalemia: Secondary | ICD-10-CM | POA: Diagnosis present

## 2013-04-07 DIAGNOSIS — R570 Cardiogenic shock: Secondary | ICD-10-CM

## 2013-04-07 DIAGNOSIS — K219 Gastro-esophageal reflux disease without esophagitis: Secondary | ICD-10-CM

## 2013-04-07 DIAGNOSIS — T460X1A Poisoning by cardiac-stimulant glycosides and drugs of similar action, accidental (unintentional), initial encounter: Secondary | ICD-10-CM

## 2013-04-07 LAB — CBC
HEMATOCRIT: 38.7 % — AB (ref 39.0–52.0)
Hemoglobin: 12.3 g/dL — ABNORMAL LOW (ref 13.0–17.0)
MCH: 25.6 pg — ABNORMAL LOW (ref 26.0–34.0)
MCHC: 31.8 g/dL (ref 30.0–36.0)
MCV: 80.5 fL (ref 78.0–100.0)
Platelets: 100 10*3/uL — ABNORMAL LOW (ref 150–400)
RBC: 4.81 MIL/uL (ref 4.22–5.81)
RDW: 18.5 % — ABNORMAL HIGH (ref 11.5–15.5)
WBC: 10.1 10*3/uL (ref 4.0–10.5)

## 2013-04-07 LAB — URINALYSIS, ROUTINE W REFLEX MICROSCOPIC
BILIRUBIN URINE: NEGATIVE
GLUCOSE, UA: NEGATIVE mg/dL
Ketones, ur: NEGATIVE mg/dL
Leukocytes, UA: NEGATIVE
Nitrite: NEGATIVE
PH: 5 (ref 5.0–8.0)
PROTEIN: NEGATIVE mg/dL
Specific Gravity, Urine: 1.015 (ref 1.005–1.030)
Urobilinogen, UA: 0.2 mg/dL (ref 0.0–1.0)

## 2013-04-07 LAB — COMPREHENSIVE METABOLIC PANEL
ALT: 7 U/L (ref 0–53)
AST: 17 U/L (ref 0–37)
Albumin: 2.7 g/dL — ABNORMAL LOW (ref 3.5–5.2)
Alkaline Phosphatase: 72 U/L (ref 39–117)
BILIRUBIN TOTAL: 1.5 mg/dL — AB (ref 0.3–1.2)
BUN: 44 mg/dL — ABNORMAL HIGH (ref 6–23)
CHLORIDE: 96 meq/L (ref 96–112)
CO2: 28 meq/L (ref 19–32)
Calcium: 9.2 mg/dL (ref 8.4–10.5)
Creatinine, Ser: 1.74 mg/dL — ABNORMAL HIGH (ref 0.50–1.35)
GFR calc Af Amer: 47 mL/min — ABNORMAL LOW (ref >90)
GFR calc non Af Amer: 40 mL/min — ABNORMAL LOW (ref >90)
Glucose, Bld: 111 mg/dL — ABNORMAL HIGH (ref 70–99)
POTASSIUM: 3.3 meq/L — AB (ref 3.7–5.3)
SODIUM: 141 meq/L (ref 137–147)
TOTAL PROTEIN: 8.7 g/dL — AB (ref 6.0–8.3)

## 2013-04-07 LAB — URINE MICROSCOPIC-ADD ON

## 2013-04-07 LAB — GLUCOSE, CAPILLARY
Glucose-Capillary: 161 mg/dL — ABNORMAL HIGH (ref 70–99)
Glucose-Capillary: 234 mg/dL — ABNORMAL HIGH (ref 70–99)
Glucose-Capillary: 166 mg/dL — ABNORMAL HIGH (ref 70–99)

## 2013-04-07 LAB — PRO B NATRIURETIC PEPTIDE: Pro B Natriuretic peptide (BNP): 30408 pg/mL — ABNORMAL HIGH (ref 0–125)

## 2013-04-07 LAB — URIC ACID: Uric Acid, Serum: 15 mg/dL — ABNORMAL HIGH (ref 4.0–7.8)

## 2013-04-07 MED ORDER — LISINOPRIL 2.5 MG PO TABS
2.5000 mg | ORAL_TABLET | Freq: Every day | ORAL | Status: DC
  Administered 2013-04-07: 13:00:00 2.5 mg via ORAL
  Filled 2013-04-07 (×2): qty 1

## 2013-04-07 MED ORDER — SODIUM CHLORIDE 0.9 % IJ SOLN: 3.0000 mL | Freq: Two times a day (BID) | INTRAMUSCULAR | Status: DC

## 2013-04-07 MED ORDER — SODIUM CHLORIDE 0.9 % IJ SOLN: 3.0000 mL | INTRAMUSCULAR | Status: DC | PRN

## 2013-04-07 MED ORDER — INSULIN ASPART 100 UNIT/ML ~~LOC~~ SOLN
0.0000 [IU] | Freq: Three times a day (TID) | SUBCUTANEOUS | Status: DC
  Administered 2013-04-07: 2 [IU] via SUBCUTANEOUS
  Administered 2013-04-07: 3 [IU] via SUBCUTANEOUS
  Administered 2013-04-08: 1 [IU] via SUBCUTANEOUS
  Administered 2013-04-08: 2 [IU] via SUBCUTANEOUS
  Administered 2013-04-08 – 2013-04-10 (×3): 1 [IU] via SUBCUTANEOUS

## 2013-04-07 MED ORDER — SODIUM CHLORIDE 0.9 % IV SOLN: 250.0000 mL | INTRAVENOUS | Status: DC | PRN

## 2013-04-07 MED ORDER — ISOSORBIDE DINITRATE 10 MG PO TABS
10.0000 mg | ORAL_TABLET | Freq: Three times a day (TID) | ORAL | Status: DC
  Administered 2013-04-07 – 2013-04-11 (×13): 10 mg via ORAL
  Filled 2013-04-07 (×16): qty 1

## 2013-04-07 MED ORDER — ASPIRIN EC 81 MG PO TBEC
81.0000 mg | DELAYED_RELEASE_TABLET | Freq: Every day | ORAL | Status: DC
  Administered 2013-04-07 – 2013-04-11 (×5): 81 mg via ORAL
  Filled 2013-04-07 (×5): qty 1

## 2013-04-07 MED ORDER — COLCHICINE 0.6 MG PO TABS
0.6000 mg | ORAL_TABLET | Freq: Every day | ORAL | Status: DC
Start: 2013-04-07 — End: 2013-04-11
  Administered 2013-04-07 – 2013-04-11 (×5): 0.6 mg via ORAL
  Filled 2013-04-07 (×5): qty 1

## 2013-04-07 MED ORDER — ACETAMINOPHEN 325 MG PO TABS
650.0000 mg | ORAL_TABLET | Freq: Four times a day (QID) | ORAL | Status: DC | PRN
  Administered 2013-04-09 – 2013-04-11 (×3): 650 mg via ORAL
  Filled 2013-04-07 (×3): qty 2

## 2013-04-07 MED ORDER — SODIUM CHLORIDE 0.9 % IJ SOLN
3.0000 mL | Freq: Two times a day (BID) | INTRAMUSCULAR | Status: DC
  Administered 2013-04-07 (×2): 3 mL via INTRAVENOUS

## 2013-04-07 MED ORDER — FUROSEMIDE 10 MG/ML IJ SOLN
40.0000 mg | Freq: Two times a day (BID) | INTRAMUSCULAR | Status: DC
  Administered 2013-04-07 – 2013-04-08 (×4): 40 mg via INTRAVENOUS
  Filled 2013-04-07 (×7): qty 4

## 2013-04-07 MED ORDER — POTASSIUM CHLORIDE CRYS ER 20 MEQ PO TBCR
40.0000 meq | EXTENDED_RELEASE_TABLET | Freq: Every day | ORAL | Status: DC
  Administered 2013-04-07 – 2013-04-08 (×2): 40 meq via ORAL
  Filled 2013-04-07 (×3): qty 2

## 2013-04-07 MED ORDER — HYDROMORPHONE HCL PF 1 MG/ML IJ SOLN
1.0000 mg | Freq: Once | INTRAMUSCULAR | Status: AC
  Administered 2013-04-07: 1 mg via INTRAVENOUS
  Filled 2013-04-07: qty 1

## 2013-04-07 MED ORDER — OXYCODONE HCL 5 MG PO TABS
5.0000 mg | ORAL_TABLET | ORAL | Status: DC | PRN
  Administered 2013-04-09 – 2013-04-10 (×4): 5 mg via ORAL
  Filled 2013-04-07 (×4): qty 1

## 2013-04-07 MED ORDER — ALLOPURINOL 100 MG PO TABS
100.0000 mg | ORAL_TABLET | Freq: Every day | ORAL | Status: DC
  Administered 2013-04-07 – 2013-04-11 (×5): 100 mg via ORAL
  Filled 2013-04-07 (×5): qty 1

## 2013-04-07 MED ORDER — ACETAMINOPHEN 650 MG RE SUPP: 650.0000 mg | Freq: Four times a day (QID) | RECTAL | Status: DC | PRN

## 2013-04-07 MED ORDER — METHYLPREDNISOLONE SODIUM SUCC 125 MG IJ SOLR
60.0000 mg | Freq: Once | INTRAMUSCULAR | Status: AC
  Administered 2013-04-07: 60 mg via INTRAVENOUS
  Filled 2013-04-07: qty 2

## 2013-04-07 MED ORDER — APIXABAN 5 MG PO TABS
5.0000 mg | ORAL_TABLET | Freq: Two times a day (BID) | ORAL | Status: DC
  Administered 2013-04-07 – 2013-04-11 (×9): 5 mg via ORAL
  Filled 2013-04-07 (×10): qty 1

## 2013-04-07 MED ORDER — ONDANSETRON HCL 4 MG PO TABS: 4.0000 mg | ORAL_TABLET | Freq: Four times a day (QID) | ORAL | Status: DC | PRN

## 2013-04-07 MED ORDER — ONDANSETRON HCL 4 MG/2ML IJ SOLN: 4.0000 mg | Freq: Four times a day (QID) | INTRAMUSCULAR | Status: DC | PRN

## 2013-04-07 MED ORDER — AMIODARONE HCL 200 MG PO TABS
200.0000 mg | ORAL_TABLET | Freq: Every day | ORAL | Status: DC
  Administered 2013-04-07 – 2013-04-11 (×5): 200 mg via ORAL
  Filled 2013-04-07 (×5): qty 1

## 2013-04-07 MED ORDER — CARVEDILOL 3.125 MG PO TABS
3.1250 mg | ORAL_TABLET | Freq: Two times a day (BID) | ORAL | Status: DC
  Administered 2013-04-07 – 2013-04-11 (×8): 3.125 mg via ORAL
  Filled 2013-04-07 (×10): qty 1

## 2013-04-07 MED ORDER — FUROSEMIDE 10 MG/ML IJ SOLN
INTRAMUSCULAR | Status: AC
  Filled 2013-04-07: qty 4

## 2013-04-07 MED ORDER — SODIUM CHLORIDE 0.9 % IV SOLN
INTRAVENOUS | Status: DC
  Administered 2013-04-07: 500 mL via INTRAVENOUS

## 2013-04-07 MED ORDER — FUROSEMIDE 10 MG/ML IJ SOLN
40.0000 mg | Freq: Once | INTRAMUSCULAR | Status: AC
Start: 2013-04-07 — End: 2013-04-07
  Administered 2013-04-07: 40 mg via INTRAVENOUS

## 2013-04-07 MED ORDER — INSULIN ASPART 100 UNIT/ML ~~LOC~~ SOLN: 0.0000 [IU] | Freq: Every day | SUBCUTANEOUS | Status: DC

## 2013-04-07 NOTE — ED Provider Notes (Addendum)
CSN: [id number]     Arrival date & time 04/06/13  2130 History   First MD Initiated Contact with Patient 04/07/13 0135     Chief Complaint  Patient presents with  . Elbow Pain  . Knee Pain  . Neck Pain  . Foot Swelling   (Consider location/radiation/quality/duration/timing/severity/associated sxs/prior Treatment) Patient is a 63 y.o. male presenting with knee pain and neck pain. The history is provided by the patient and a relative.  Knee Pain Associated symptoms: neck pain   Associated symptoms: no back pain and no fever   Neck Pain Associated symptoms: no chest pain, no fever and no headaches   pt with hx chf, gout, presents c/o pain/swelling to left ankle/foot, left knee, left elbow, for the past several days. States when has gout/flare it typically only affects these joints on left side of body. Pain constant, dull, mod-severe. Worse w movement and palpation. No associated nv. No fever or chills. Also notes bil ankle and lower leg swelling. Hx chf. ?mild orthopnea. No cp. Mild sob. States compliant w normal meds.  Family also notes pt falls asleep in chair, w head/neck hanging down or to side, and that recently c/o neck soreness. No radicular pain. No headaches. No trauma or fall. No numbness/weakness.      Past Medical History  Diagnosis Date  . Diabetes mellitus without complication   . CHF (congestive heart failure)   . Hypertension   . Cardiomyopathy, dilated   . Small bowel obstruction 10/2012  . COPD (chronic obstructive pulmonary disease)   . Herpes     BUTT AND BACK- 9/23 HEALED  . Atrial flutter by electrocardiogram 11/2012  . GERD (gastroesophageal reflux disease)   . Gout    Past Surgical History  Procedure Laterality Date  . Laparoscopic incisional / umbilical / ventral hernia repair     Family History  Problem Relation Age of Onset  . Cancer Neg Hx    History  Substance Use Topics  . Smoking status: Former Smoker -- 0.25 packs/day for .5 years    Types:  Cigarettes    Quit date: 09/28/2012  . Smokeless tobacco: Never Used  . Alcohol Use: No    Review of Systems  Constitutional: Negative for fever and chills.  HENT: Negative for sore throat.   Eyes: Negative for redness.  Respiratory: Positive for shortness of breath. Negative for cough.   Cardiovascular: Negative for chest pain.  Gastrointestinal: Negative for nausea, vomiting, abdominal pain and diarrhea.  Genitourinary: Negative for flank pain.  Musculoskeletal: Positive for neck pain. Negative for back pain.  Skin: Negative for rash.  Neurological: Negative for headaches.  Hematological: Does not bruise/bleed easily.  Psychiatric/Behavioral: Negative for confusion.    Allergies  Other  Home Medications   Current Outpatient Rx  Name  Route  Sig  Dispense  Refill  . allopurinol (ZYLOPRIM) 100 MG tablet   Oral   Take 1 tablet (100 mg total) by mouth daily.   30 tablet   3   . amiodarone (PACERONE) 200 MG tablet   Oral   Take 1 tablet (200 mg total) by mouth daily.   30 tablet   0   . apixaban (ELIQUIS) 5 MG TABS tablet   Oral   Take 1 tablet (5 mg total) by mouth every 12 (twelve) hours.   60 tablet      . aspirin EC 81 MG tablet   Oral   Take 81 mg by mouth daily.         Marland Kitchen  carvedilol (COREG) 3.125 MG tablet   Oral   Take 1 tablet (3.125 mg total) by mouth 2 (two) times daily with a meal.         . colchicine 0.6 MG tablet   Oral   Take 1 tablet (0.6 mg total) by mouth daily.   20 tablet   0   . isosorbide dinitrate (ISORDIL) 10 MG tablet   Oral   Take 10 mg by mouth 3 (three) times daily.          Marland Kitchen lisinopril (PRINIVIL,ZESTRIL) 2.5 MG tablet   Oral   Take 2.5 mg by mouth daily.         . meloxicam (MOBIC) 15 MG tablet   Oral   Take 1 tablet (15 mg total) by mouth daily.   20 tablet   0   . spironolactone (ALDACTONE) 25 MG tablet   Oral   Take 25 mg by mouth daily.         Marland Kitchen torsemide (DEMADEX) 20 MG tablet   Oral   Take 40  mg by mouth 2 (two) times daily.           BP 109/63  Pulse 92  Temp(Src) 98.9 F (37.2 C) (Oral)  Resp 20  Ht 6' (1.829 m)  Wt 230 lb (104.327 kg)  BMI 31.19 kg/m2  SpO2 95% Physical Exam  Nursing note and vitals reviewed. Constitutional: He is oriented to person, place, and time. He appears well-developed and well-nourished. No distress.  HENT:  Nose: Nose normal.  Mouth/Throat: Oropharynx is clear and moist.  Eyes: Conjunctivae are normal.  Neck: Normal range of motion. Neck supple. No tracheal deviation present.  No stiffness/rigidity  Cardiovascular: Normal rate, regular rhythm, normal heart sounds and intact distal pulses.   Pulmonary/Chest: Effort normal and breath sounds normal. No accessory muscle usage. No respiratory distress.  Abdominal: Soft. He exhibits no distension and no mass. There is no tenderness. There is no rebound and no guarding.  Genitourinary:  No cva tenderness  Musculoskeletal: Normal range of motion. He exhibits edema.  Symmetric bil ankle/lower leg edema. No calf tenderness. Mild swelling, warmth to left ankle, knee, elbow c/w gout. Distal pulses palp.  CTLS spine, non tender, aligned, no step off. Mild trapezius/muscular tenderness.   Neurological: He is alert and oriented to person, place, and time.  Skin: Skin is warm and dry.  Psychiatric: He has a normal mood and affect.    ED Course  Procedures (including critical care time)   Results for orders placed during the hospital encounter of 04/07/13  CBC      Result Value Range   WBC 10.1  4.0 - 10.5 K/uL   RBC 4.81  4.22 - 5.81 MIL/uL   Hemoglobin 12.3 (*) 13.0 - 17.0 g/dL   HCT 38.7 (*) 39.0 - 52.0 %   MCV 80.5  78.0 - 100.0 fL   MCH 25.6 (*) 26.0 - 34.0 pg   MCHC 31.8  30.0 - 36.0 g/dL   RDW 18.5 (*) 11.5 - 15.5 %   Platelets 100 (*) 150 - 400 K/uL  COMPREHENSIVE METABOLIC PANEL      Result Value Range   Sodium 141  137 - 147 mEq/L   Potassium 3.3 (*) 3.7 - 5.3 mEq/L   Chloride  96  96 - 112 mEq/L   CO2 28  19 - 32 mEq/L   Glucose, Bld 111 (*) 70 - 99 mg/dL   BUN 44 (*) 6 - 23  mg/dL   Creatinine, Ser 1.74 (*) 0.50 - 1.35 mg/dL   Calcium 9.2  8.4 - 10.5 mg/dL   Total Protein 8.7 (*) 6.0 - 8.3 g/dL   Albumin 2.7 (*) 3.5 - 5.2 g/dL   AST 17  0 - 37 U/L   ALT 7  0 - 53 U/L   Alkaline Phosphatase 72  39 - 117 U/L   Total Bilirubin 1.5 (*) 0.3 - 1.2 mg/dL   GFR calc non Af Amer 40 (*) >90 mL/min   GFR calc Af Amer 47 (*) >90 mL/min  URINALYSIS, ROUTINE W REFLEX MICROSCOPIC      Result Value Range   Color, Urine YELLOW  YELLOW   APPearance CLOUDY (*) CLEAR   Specific Gravity, Urine 1.015  1.005 - 1.030   pH 5.0  5.0 - 8.0   Glucose, UA NEGATIVE  NEGATIVE mg/dL   Hgb urine dipstick LARGE (*) NEGATIVE   Bilirubin Urine NEGATIVE  NEGATIVE   Ketones, ur NEGATIVE  NEGATIVE mg/dL   Protein, ur NEGATIVE  NEGATIVE mg/dL   Urobilinogen, UA 0.2  0.0 - 1.0 mg/dL   Nitrite NEGATIVE  NEGATIVE   Leukocytes, UA NEGATIVE  NEGATIVE  PRO B NATRIURETIC PEPTIDE      Result Value Range   Pro B Natriuretic peptide (BNP) 30408.0 (*) 0 - 125 pg/mL  URIC ACID      Result Value Range   Uric Acid, Serum 15.0 (*) 4.0 - 7.8 mg/dL  URINE MICROSCOPIC-ADD ON      Result Value Range   Squamous Epithelial / LPF RARE  RARE   RBC / HPF TOO NUMEROUS TO COUNT  <3 RBC/hpf   Bacteria, UA FEW (*) RARE   Casts GRANULAR CAST (*) NEGATIVE   Dg Chest 2 View  04/07/2013   CLINICAL DATA:  Chest pain.  EXAM: CHEST  2 VIEW  COMPARISON:  03/23/2013  FINDINGS: Marked cardiopericardial enlargement, which is chronic. There is pulmonary venous congestion without overt edema. Streaky retrocardiac opacification which was also seen previously, likely atelectasis from the cardiomegaly.  IMPRESSION: 1. Cardiomegaly and pulmonary venous hypertension. 2. Retrocardiac opacity, likely atelectasis.   Electronically Signed   By: Jorje Guild M.D.   On: 04/07/2013 03:31   Dg Chest 2 View  03/23/2013   CLINICAL DATA:   Followup evaluation of pneumonia.  EXAM: CHEST  2 VIEW  COMPARISON:  Chest x-ray 03/15/2013.  FINDINGS: Bibasilar opacities (left greater than right) may reflect areas of atelectasis and/or consolidation. Probable small left pleural effusion (unchanged). No definite cephalization of the pulmonary vasculature. Heart size is mild moderately enlarged. Upper mediastinal contours are distorted by patient's rotation the left. Atherosclerosis in the thoracic aorta.  IMPRESSION: 1. Persistent bibasilar (left greater than right) atelectasis and/or consolidation with small left pleural effusion. Findings are favored to reflect a multilobar pneumonia, as there is no cephalization of the pulmonary vasculature at this time to suggest underlying edema. 2. Mild to moderate cardiomegaly is unchanged. 3. Atherosclerosis.   Electronically Signed   By: Vinnie Langton M.D.   On: 03/23/2013 11:02   Dg Chest 2 View  03/13/2013   CLINICAL DATA:  CHF.  EXAM: CHEST  2 VIEW  COMPARISON:  03/12/2013  FINDINGS: The heart is enlarged but stable. The lungs show improved aeration with better lung volumes and more aerated lung. There are persistent patchy bilateral airspace opacities which could be residual edema or infiltrates. Persistent left lower lobe process with airspace consolidation and pleural effusion.  IMPRESSION:  Improved lung aeration with probable resolving pulmonary edema.  Patchy bilateral airspace opacities and persistent left lower lobe process.   Electronically Signed   By: Kalman Jewels M.D.   On: 03/13/2013 13:50   Dg Chest 2 View  03/09/2013   CLINICAL DATA:  Fatigue and nausea  EXAM: CHEST  2 VIEW  COMPARISON:  12/06/2012  FINDINGS: Chronic cardiopericardial enlargement. Diffuse interstitial coarsening. Streaky lower lung opacities. No effusion. No acute osseous findings.  IMPRESSION: Cardiomegaly and pulmonary edema. Cannot exclude infiltrates at the bases.   Electronically Signed   By: Jorje Guild M.D.   On:  03/09/2013 23:26   Ct Head Wo Contrast  03/09/2013   CLINICAL DATA:  Weakness  EXAM: CT HEAD WITHOUT CONTRAST  TECHNIQUE: Contiguous axial images were obtained from the base of the skull through the vertex without intravenous contrast.  COMPARISON:  Prior CT from 12/10/2012  FINDINGS: Subcentimeter hypodensity within the superior left cerebellar hemisphere again noted, which may represent a small remote infarct, unchanged. There is no acute intracranial hemorrhage or infarct. No mass lesion or midline shift. Gray-white matter differentiation is well maintained. Ventricles are normal in size without evidence of hydrocephalus. CSF containing spaces are within normal limits. No extra-axial fluid collection.  The calvarium is intact.  Orbital soft tissues are within normal limits.  The paranasal sinuses and mastoid air cells are well pneumatized and free of fluid.  Scalp soft tissues are unremarkable.  IMPRESSION: No acute intracranial abnormality.   Electronically Signed   By: Jeannine Boga M.D.   On: 03/09/2013 23:36   Dg Chest Port 1 View  03/15/2013   CLINICAL DATA:  Pulmonary edema  EXAM: PORTABLE CHEST - 1 VIEW  COMPARISON:  03/13/2013  FINDINGS: Mild progression of bilateral airspace disease. This may represent edema. There is atelectasis in the lung bases with a probable small left effusion. Cardiac enlargement is present.  IMPRESSION: Worsening bilateral airspace disease. Differential includes pulmonary edema and pulmonary infection.   Electronically Signed   By: Franchot Gallo M.D.   On: 03/15/2013 07:42   Dg Chest Port 1 View  03/12/2013   CLINICAL DATA:  Extubation.  followup CHF.  EXAM: PORTABLE CHEST - 1 VIEW  COMPARISON:  Portable examinations yesterday an 03/10/2013. Two-view chest x-ray 03/09/2013.  FINDINGS: Extubation and nasogastric tube removal. Right jugular central venous catheter tip projects over the lower SVC. No significant change since yesterday in interstitial and airspace  pulmonary edema throughout both lungs. Stable dense left lower lobe atelectasis. No new pulmonary parenchymal abnormalities.  IMPRESSION: Stable severe CHF and/or fluid overload post extubation. Stable dense left lower lobe atelectasis. No new abnormalities.   Electronically Signed   By: Evangeline Dakin M.D.   On: 03/12/2013 08:32   Dg Chest Port 1 View  03/11/2013   CLINICAL DATA:  Intubated  EXAM: PORTABLE CHEST - 1 VIEW  COMPARISON:  Prior chest x-ray 03/10/2013  FINDINGS: The patient is markedly rotated toward the left. The endotracheal tube is 3.5 cm above the carinal. A nasogastric tube is incompletely imaged. The tip lies off the field of view, presumably within the stomach. Stable position of right IJ non tunneled hemodialysis catheter. The catheter tip projects over the superior cavoatrial junction.  A decreased aeration over the last 24 hr likely secondary to increasing pulmonary edema. Additionally, there are likely bilateral layering effusions which are slightly larger than prior. No pneumothorax.  IMPRESSION: 1. Slight interval worsening of pulmonary edema. 2. Probable bilateral layering pleural effusions  slightly larger since the prior chest x-ray. 3. Bibasilar opacities favored to reflect pleural fluid with atelectasis and edema. Superimposed infection is difficult to exclude entirely. 4. Stable and satisfactory support apparatus.   Electronically Signed   By: Jacqulynn Cadet M.D.   On: 03/11/2013 07:33   Dg Chest Port 1 View  03/10/2013   CLINICAL DATA:  Central line placement.  EXAM: PORTABLE CHEST - 1 VIEW  COMPARISON:  Chest x-ray from the same day at 12:30 a.m.  FINDINGS: Endotracheal and orogastric tubes show no interval displacement. There is a new right IJ catheter which is directed centrally. Rotation limits further localization. Cardiopericardial enlargement. Pulmonary edema and lower lung opacification which may have increased from prior. No pneumothorax.  IMPRESSION: 1. New right IJ  catheter.  No pneumothorax. 2. No interval displacement of orogastric and endotracheal tubes. 3. Worsening lower lung aeration, potentially positional.   Electronically Signed   By: Jorje Guild M.D.   On: 03/10/2013 03:33   Dg Chest Portable 1 View  03/10/2013   CLINICAL DATA:  Hypoxia  EXAM: PORTABLE CHEST - 1 VIEW  COMPARISON:  March 09, 2013  FINDINGS: Endotracheal tube tip is 2.9 cm above the carina. Nasogastric tube tip and side port are below the diaphragm. No pneumothorax.  There is consolidation throughout the left lower lobe. There is patchy infiltrate throughout portions of the left upper lobe. These changes are slightly more prominent on the current examination compared to 1 day prior. There is mild atelectasis in the right base. Right lung is otherwise clear. Generalized cardiomegaly is stable. The pulmonary vascularity is normal. No adenopathy.  IMPRESSION: Increased consolidation on the left, particularly in the left lower lobe. Stable cardiomegaly. Tube positions as described without pneumothorax.   Electronically Signed   By: Lowella Grip M.D.   On: 03/10/2013 01:21    Results for orders placed during the hospital encounter of 04/07/13  CBC      Result Value Range   WBC 10.1  4.0 - 10.5 K/uL   RBC 4.81  4.22 - 5.81 MIL/uL   Hemoglobin 12.3 (*) 13.0 - 17.0 g/dL   HCT 38.7 (*) 39.0 - 52.0 %   MCV 80.5  78.0 - 100.0 fL   MCH 25.6 (*) 26.0 - 34.0 pg   MCHC 31.8  30.0 - 36.0 g/dL   RDW 18.5 (*) 11.5 - 15.5 %   Platelets 100 (*) 150 - 400 K/uL  COMPREHENSIVE METABOLIC PANEL      Result Value Range   Sodium 141  137 - 147 mEq/L   Potassium 3.3 (*) 3.7 - 5.3 mEq/L   Chloride 96  96 - 112 mEq/L   CO2 28  19 - 32 mEq/L   Glucose, Bld 111 (*) 70 - 99 mg/dL   BUN 44 (*) 6 - 23 mg/dL   Creatinine, Ser 1.74 (*) 0.50 - 1.35 mg/dL   Calcium 9.2  8.4 - 10.5 mg/dL   Total Protein 8.7 (*) 6.0 - 8.3 g/dL   Albumin 2.7 (*) 3.5 - 5.2 g/dL   AST 17  0 - 37 U/L   ALT 7  0 - 53 U/L    Alkaline Phosphatase 72  39 - 117 U/L   Total Bilirubin 1.5 (*) 0.3 - 1.2 mg/dL   GFR calc non Af Amer 40 (*) >90 mL/min   GFR calc Af Amer 47 (*) >90 mL/min  URINALYSIS, ROUTINE W REFLEX MICROSCOPIC      Result Value Range  Color, Urine YELLOW  YELLOW   APPearance CLOUDY (*) CLEAR   Specific Gravity, Urine 1.015  1.005 - 1.030   pH 5.0  5.0 - 8.0   Glucose, UA NEGATIVE  NEGATIVE mg/dL   Hgb urine dipstick LARGE (*) NEGATIVE   Bilirubin Urine NEGATIVE  NEGATIVE   Ketones, ur NEGATIVE  NEGATIVE mg/dL   Protein, ur NEGATIVE  NEGATIVE mg/dL   Urobilinogen, UA 0.2  0.0 - 1.0 mg/dL   Nitrite NEGATIVE  NEGATIVE   Leukocytes, UA NEGATIVE  NEGATIVE  PRO B NATRIURETIC PEPTIDE      Result Value Range   Pro B Natriuretic peptide (BNP) 30408.0 (*) 0 - 125 pg/mL  URIC ACID      Result Value Range   Uric Acid, Serum 15.0 (*) 4.0 - 7.8 mg/dL  URINE MICROSCOPIC-ADD ON      Result Value Range   Squamous Epithelial / LPF RARE  RARE   RBC / HPF TOO NUMEROUS TO COUNT  <3 RBC/hpf   Bacteria, UA FEW (*) RARE   Casts GRANULAR CAST (*) NEGATIVE   Dg Chest 2 View  04/07/2013   CLINICAL DATA:  Chest pain.  EXAM: CHEST  2 VIEW  COMPARISON:  03/23/2013  FINDINGS: Marked cardiopericardial enlargement, which is chronic. There is pulmonary venous congestion without overt edema. Streaky retrocardiac opacification which was also seen previously, likely atelectasis from the cardiomegaly.  IMPRESSION: 1. Cardiomegaly and pulmonary venous hypertension. 2. Retrocardiac opacity, likely atelectasis.   Electronically Signed   By: Jorje Guild M.D.   On: 04/07/2013 03:31   Dg Chest 2 View  03/23/2013   CLINICAL DATA:  Followup evaluation of pneumonia.  EXAM: CHEST  2 VIEW  COMPARISON:  Chest x-ray 03/15/2013.  FINDINGS: Bibasilar opacities (left greater than right) may reflect areas of atelectasis and/or consolidation. Probable small left pleural effusion (unchanged). No definite cephalization of the pulmonary  vasculature. Heart size is mild moderately enlarged. Upper mediastinal contours are distorted by patient's rotation the left. Atherosclerosis in the thoracic aorta.  IMPRESSION: 1. Persistent bibasilar (left greater than right) atelectasis and/or consolidation with small left pleural effusion. Findings are favored to reflect a multilobar pneumonia, as there is no cephalization of the pulmonary vasculature at this time to suggest underlying edema. 2. Mild to moderate cardiomegaly is unchanged. 3. Atherosclerosis.   Electronically Signed   By: Vinnie Langton M.D.   On: 03/23/2013 11:02   Dg Chest 2 View  03/13/2013   CLINICAL DATA:  CHF.  EXAM: CHEST  2 VIEW  COMPARISON:  03/12/2013  FINDINGS: The heart is enlarged but stable. The lungs show improved aeration with better lung volumes and more aerated lung. There are persistent patchy bilateral airspace opacities which could be residual edema or infiltrates. Persistent left lower lobe process with airspace consolidation and pleural effusion.  IMPRESSION: Improved lung aeration with probable resolving pulmonary edema.  Patchy bilateral airspace opacities and persistent left lower lobe process.   Electronically Signed   By: Kalman Jewels M.D.   On: 03/13/2013 13:50   Dg Chest 2 View  03/09/2013   CLINICAL DATA:  Fatigue and nausea  EXAM: CHEST  2 VIEW  COMPARISON:  12/06/2012  FINDINGS: Chronic cardiopericardial enlargement. Diffuse interstitial coarsening. Streaky lower lung opacities. No effusion. No acute osseous findings.  IMPRESSION: Cardiomegaly and pulmonary edema. Cannot exclude infiltrates at the bases.   Electronically Signed   By: Jorje Guild M.D.   On: 03/09/2013 23:26   Ct Head Wo Contrast  03/09/2013   CLINICAL DATA:  Weakness  EXAM: CT HEAD WITHOUT CONTRAST  TECHNIQUE: Contiguous axial images were obtained from the base of the skull through the vertex without intravenous contrast.  COMPARISON:  Prior CT from 12/10/2012  FINDINGS:  Subcentimeter hypodensity within the superior left cerebellar hemisphere again noted, which may represent a small remote infarct, unchanged. There is no acute intracranial hemorrhage or infarct. No mass lesion or midline shift. Gray-white matter differentiation is well maintained. Ventricles are normal in size without evidence of hydrocephalus. CSF containing spaces are within normal limits. No extra-axial fluid collection.  The calvarium is intact.  Orbital soft tissues are within normal limits.  The paranasal sinuses and mastoid air cells are well pneumatized and free of fluid.  Scalp soft tissues are unremarkable.  IMPRESSION: No acute intracranial abnormality.   Electronically Signed   By: Jeannine Boga M.D.   On: 03/09/2013 23:36   Dg Chest Port 1 View  03/15/2013   CLINICAL DATA:  Pulmonary edema  EXAM: PORTABLE CHEST - 1 VIEW  COMPARISON:  03/13/2013  FINDINGS: Mild progression of bilateral airspace disease. This may represent edema. There is atelectasis in the lung bases with a probable small left effusion. Cardiac enlargement is present.  IMPRESSION: Worsening bilateral airspace disease. Differential includes pulmonary edema and pulmonary infection.   Electronically Signed   By: Franchot Gallo M.D.   On: 03/15/2013 07:42   Dg Chest Port 1 View  03/12/2013   CLINICAL DATA:  Extubation.  followup CHF.  EXAM: PORTABLE CHEST - 1 VIEW  COMPARISON:  Portable examinations yesterday an 03/10/2013. Two-view chest x-ray 03/09/2013.  FINDINGS: Extubation and nasogastric tube removal. Right jugular central venous catheter tip projects over the lower SVC. No significant change since yesterday in interstitial and airspace pulmonary edema throughout both lungs. Stable dense left lower lobe atelectasis. No new pulmonary parenchymal abnormalities.  IMPRESSION: Stable severe CHF and/or fluid overload post extubation. Stable dense left lower lobe atelectasis. No new abnormalities.   Electronically Signed   By:  Evangeline Dakin M.D.   On: 03/12/2013 08:32   Dg Chest Port 1 View  03/11/2013   CLINICAL DATA:  Intubated  EXAM: PORTABLE CHEST - 1 VIEW  COMPARISON:  Prior chest x-ray 03/10/2013  FINDINGS: The patient is markedly rotated toward the left. The endotracheal tube is 3.5 cm above the carinal. A nasogastric tube is incompletely imaged. The tip lies off the field of view, presumably within the stomach. Stable position of right IJ non tunneled hemodialysis catheter. The catheter tip projects over the superior cavoatrial junction.  A decreased aeration over the last 24 hr likely secondary to increasing pulmonary edema. Additionally, there are likely bilateral layering effusions which are slightly larger than prior. No pneumothorax.  IMPRESSION: 1. Slight interval worsening of pulmonary edema. 2. Probable bilateral layering pleural effusions slightly larger since the prior chest x-ray. 3. Bibasilar opacities favored to reflect pleural fluid with atelectasis and edema. Superimposed infection is difficult to exclude entirely. 4. Stable and satisfactory support apparatus.   Electronically Signed   By: Jacqulynn Cadet M.D.   On: 03/11/2013 07:33   Dg Chest Port 1 View  03/10/2013   CLINICAL DATA:  Central line placement.  EXAM: PORTABLE CHEST - 1 VIEW  COMPARISON:  Chest x-ray from the same day at 12:30 a.m.  FINDINGS: Endotracheal and orogastric tubes show no interval displacement. There is a new right IJ catheter which is directed centrally. Rotation limits further localization. Cardiopericardial enlargement. Pulmonary edema and  lower lung opacification which may have increased from prior. No pneumothorax.  IMPRESSION: 1. New right IJ catheter.  No pneumothorax. 2. No interval displacement of orogastric and endotracheal tubes. 3. Worsening lower lung aeration, potentially positional.   Electronically Signed   By: Jorje Guild M.D.   On: 03/10/2013 03:33   Dg Chest Portable 1 View  03/10/2013   CLINICAL DATA:   Hypoxia  EXAM: PORTABLE CHEST - 1 VIEW  COMPARISON:  March 09, 2013  FINDINGS: Endotracheal tube tip is 2.9 cm above the carina. Nasogastric tube tip and side port are below the diaphragm. No pneumothorax.  There is consolidation throughout the left lower lobe. There is patchy infiltrate throughout portions of the left upper lobe. These changes are slightly more prominent on the current examination compared to 1 day prior. There is mild atelectasis in the right base. Right lung is otherwise clear. Generalized cardiomegaly is stable. The pulmonary vascularity is normal. No adenopathy.  IMPRESSION: Increased consolidation on the left, particularly in the left lower lobe. Stable cardiomegaly. Tube positions as described without pneumothorax.   Electronically Signed   By: Lowella Grip M.D.   On: 03/10/2013 01:21     Date: 04/07/2013  Rate: 85  Rhythm: normal sinus rhythm  QRS Axis: left  Intervals: normal  ST/T Wave abnormalities: nonspecific ST/T changes  Conduction Disutrbances:right bundle branch block and left anterior fascicular block  Narrative Interpretation:   Old EKG Reviewed: unchanged      MDM  Iv ns. Dilaudid iv. Solumedrol iv.  Labs.  Reviewed nursing notes and prior charts for additional history.   w markedly elevated bnp, increased bil leg edema, orthopnea, will give lasix iv.  w iv meds, improved pain control re joint pain/gout.  Family states recently home from ecf, unable to ambulate due to pain/swelling.  Bun/cr sl increased from prior, cr down to 1.2 during recent hospital stay.   Hematuria noted, recent uti, will culture urine. Pt on eliquis.  plts also mildly low.  Based on above multiple issues, med service called to admit.  Discussed with [facility] hospitalist - they will see in ED.        Mirna Mires, MD 04/07/13 [phone]

## 2013-04-07 NOTE — Progress Notes (Signed)
Pt admitted for CHF exacerbation, heart failure and COPD booklet given to pt, no c/o pain or SOB, vss, pt stable

## 2013-04-07 NOTE — ED Notes (Signed)
Hospitalist at bedside 

## 2013-04-07 NOTE — ED Notes (Signed)
Gave oncoming RN dilaudid to administer.

## 2013-04-07 NOTE — Progress Notes (Signed)
Utilization Review Completed.  

## 2013-04-07 NOTE — Evaluation (Signed)
Physical Therapy Evaluation Patient Details Name: Caleb Bauer MRN: [id number] DOB: April 16, 1950 Today's Date: 04/07/2013 Time: 4098-1191 PT Time Calculation (min): 25 min  PT Assessment / Plan / Recommendation History of Present Illness  Pt admitted with R knee & ankle pain, hypoxia, CHF.   Clinical Impression  *Pt admitted with *CHF, gout, hypoxia**. Pt currently with functional limitations due to the deficits listed below (see PT Problem List).  Pt will benefit from skilled PT to increase their independence and safety with mobility to allow discharge to the venue listed below.   **    PT Assessment  Patient needs continued PT services    Follow Up Recommendations  Home health PT    Does the patient have the potential to tolerate intense rehabilitation      Barriers to Discharge        Equipment Recommendations  None recommended by PT    Recommendations for Other Services     Frequency Min 3X/week    Precautions / Restrictions Precautions Precautions: None Restrictions Weight Bearing Restrictions: No   Pertinent Vitals/Pain *SaO2 96% on RA with activity 3/10 pain RLE -repositioned**      Mobility  Bed Mobility Overal bed mobility: Modified Independent General bed mobility comments: HOB elevated Transfers Overall transfer level: Needs assistance Equipment used: Rolling walker (2 wheeled) Transfers: Sit to/from Stand Sit to Stand: Supervision General transfer comment: VCs hand placement Ambulation/Gait Ambulation/Gait assistance: Min guard Ambulation Distance (Feet): 4 Feet Assistive device: Rolling walker (2 wheeled) Gait Pattern/deviations: Decreased step length - left;Decreased step length - right;Decreased stance time - right;Step-to pattern Gait velocity: decr 2* pain General Gait Details: Distance limited by pain in R knee/ankle    Exercises     PT Diagnosis: Difficulty walking  PT Problem List: Decreased activity tolerance;Decreased  mobility;Pain PT Treatment Interventions: Gait training;Stair training;Therapeutic exercise;Therapeutic activities;Patient/family education     PT Goals(Current goals can be found in the care plan section) Acute Rehab PT Goals Patient Stated Goal: to walk PT Goal Formulation: With patient Time For Goal Achievement: 04/21/13 Potential to Achieve Goals: Good  Visit Information  Last PT Received On: 04/07/13 History of Present Illness: Pt admitted with R knee & ankle pain, hypoxia, CHF.        Prior [place] expects to be discharged to:: Private residence Living Arrangements: Other relatives Available Help at Discharge: Family;Available PRN/intermittently Type of Home: Apartment Home Access: Stairs to enter Entrance Stairs-Number of Steps: 13 Entrance Stairs-Rails: Right Home Layout: One level Home Equipment: Cane - single point;Walker - 2 wheels Additional Comments: lives with niece Prior Function Level of Independence: Independent with assistive device(s) Comments: Used RW inside, cane outside Communication Communication: No difficulties Dominant Hand: Right    Cognition  Cognition Arousal/Alertness: Awake/alert Behavior During Therapy: WFL for tasks assessed/performed Overall Cognitive Status: Within Functional Limits for tasks assessed    Extremity/Trunk Assessment Upper Extremity Assessment Upper Extremity Assessment: Overall WFL for tasks assessed Lower Extremity Assessment Lower Extremity Assessment: Overall WFL for tasks assessed Cervical / Trunk Assessment Cervical / Trunk Assessment: Normal   Balance Balance Overall balance assessment: Modified Independent General Comments General comments (skin integrity, edema, etc.): steady with RW in standing, independent in sitting  End of Session PT - End of Session Equipment Utilized During Treatment: Gait belt Activity Tolerance: Patient limited by pain Patient left: in chair;with  call bell/phone within reach Nurse Communication: Mobility status  GP     Blondell Reveal Kistler 04/07/2013, 1:38 PM [phone]

## 2013-04-07 NOTE — ED Notes (Signed)
Attempted IV x 2. Pt transported to radiology

## 2013-04-07 NOTE — H&P (Signed)
[facility] History and Physical  Caleb Bauer BPZ:[id number] DOB: February 23, 1951 DOA: 04/07/2013  Referring physician: er PCP: No PCP Per Patient   Chief Complaint: leg pain  HPI: Caleb Bauer is a 63 y.o. male  Who has recently received dilaudid and can provide only minimal history.  He was hospitalized in January under PCCM.  He was discharge to [facility] but has since left there and moved into an apartment.  He walks with a walker.  He presented to the hospital today with right ankle/knee and neck pain.  He thinks it is gout.  He has an EF of 25% but denies SOB.    In the ER he was found to be hypoxic and placed on 4L O2.  He was given lasix and solumedrol as well as a dose of dilaudid.  His BNP was elevated as well as his Cr.  His x ray showed cardiomegaly and pulm edema (mild)    Review of Systems:  All systems reviewed, negative unless stated above   Past Medical History  Diagnosis Date  . Diabetes mellitus without complication   . CHF (congestive heart failure)   . Hypertension   . Cardiomyopathy, dilated   . Small bowel obstruction 10/2012  . COPD (chronic obstructive pulmonary disease)   . Herpes     BUTT AND BACK- 9/23 HEALED  . Atrial flutter by electrocardiogram 11/2012  . GERD (gastroesophageal reflux disease)   . Gout    Past Surgical History  Procedure Laterality Date  . Laparoscopic incisional / umbilical / ventral hernia repair     Social History:  reports that he quit smoking about 6 months ago. His smoking use included Cigarettes. He has a .125 pack-year smoking history. He has never used smokeless tobacco. He reports that he does not drink alcohol or use illicit drugs.  Allergies  Allergen Reactions  . Other Nausea And Vomiting    Patient stated drug is "kerein"    Family History  Problem Relation Age of Onset  . Cancer Neg Hx      Prior to Admission medications   Medication Sig Start Date End Date Taking? Authorizing Provider  allopurinol  (ZYLOPRIM) 100 MG tablet Take 1 tablet (100 mg total) by mouth daily. 10/20/12  Yes Tiffany L Reed, DO  amiodarone (PACERONE) 200 MG tablet Take 1 tablet (200 mg total) by mouth daily. 09/24/12  Yes Reyne Dumas, MD  apixaban (ELIQUIS) 5 MG TABS tablet Take 1 tablet (5 mg total) by mouth every 12 (twelve) hours. 12/02/12  Yes Charlynne Cousins, MD  aspirin EC 81 MG tablet Take 81 mg by mouth daily.   Yes Historical Provider, MD  carvedilol (COREG) 3.125 MG tablet Take 1 tablet (3.125 mg total) by mouth 2 (two) times daily with a meal. 12/02/12  Yes Charlynne Cousins, MD  colchicine 0.6 MG tablet Take 1 tablet (0.6 mg total) by mouth daily. 03/01/13  Yes Peter S Dammen, PA-C  isosorbide dinitrate (ISORDIL) 10 MG tablet Take 10 mg by mouth 3 (three) times daily.    Yes Historical Provider, MD  lisinopril (PRINIVIL,ZESTRIL) 2.5 MG tablet Take 2.5 mg by mouth daily.   Yes Historical Provider, MD  meloxicam (MOBIC) 15 MG tablet Take 1 tablet (15 mg total) by mouth daily. 03/01/13  Yes Peter S Dammen, PA-C  spironolactone (ALDACTONE) 25 MG tablet Take 25 mg by mouth daily.   Yes Historical Provider, MD  torsemide (DEMADEX) 20 MG tablet Take 40 mg by mouth 2 (  two) times daily.  12/09/12  Yes Barton Dubois, MD   Physical Exam: Filed Vitals:   04/07/13 0730  BP: 125/77  Pulse: 72  Temp:   Resp: 16    BP 125/77  Pulse 72  Temp(Src) 98.9 F (37.2 C) (Oral)  Resp 16  Ht 6' (1.829 m)  Wt 104.327 kg (230 lb)  BMI 31.19 kg/m2  SpO2 97%  General:  Sleeping- will awake but fall back to sleep Eyes: PERRL, normal lids, irises & conjunctiva ENT: grossly normal hearing, lips & tongue Neck: no LAD, masses or thyromegaly Cardiovascular: RRR, no m/r/g. No LE edema. Respiratory: CTA bilaterally, no w/r/r. Normal respiratory effort. Abdomen: soft, ntnd Skin: no swelling or tenderness in joints Musculoskeletal: grossly normal tone BUE/BLE Psychiatric: grossly normal mood and affect, speech fluent and  appropriate Neurologic: grossly non-focal.          Labs on Admission:  Basic Metabolic Panel:  Recent Labs Lab 04/07/13 0223  NA 141  K 3.3*  CL 96  CO2 28  GLUCOSE 111*  BUN 44*  CREATININE 1.74*  CALCIUM 9.2   Liver Function Tests:  Recent Labs Lab 04/07/13 0223  AST 17  ALT 7  ALKPHOS 72  BILITOT 1.5*  PROT 8.7*  ALBUMIN 2.7*   No results found for this basename: LIPASE, AMYLASE,  in the last 168 hours No results found for this basename: AMMONIA,  in the last 168 hours CBC:  Recent Labs Lab 04/07/13 0223  WBC 10.1  HGB 12.3*  HCT 38.7*  MCV 80.5  PLT 100*   Cardiac Enzymes: No results found for this basename: CKTOTAL, CKMB, CKMBINDEX, TROPONINI,  in the last 168 hours  BNP (last 3 results)  Recent Labs  12/06/12 1618 03/23/13 1255 04/07/13 0223  PROBNP 16524.0* >5000.0* 30408.0*   CBG: No results found for this basename: GLUCAP,  in the last 168 hours  Radiological Exams on Admission: Dg Chest 2 View  04/07/2013   CLINICAL DATA:  Chest pain.  EXAM: CHEST  2 VIEW  COMPARISON:  03/23/2013  FINDINGS: Marked cardiopericardial enlargement, which is chronic. There is pulmonary venous congestion without overt edema. Streaky retrocardiac opacification which was also seen previously, likely atelectasis from the cardiomegaly.  IMPRESSION: 1. Cardiomegaly and pulmonary venous hypertension. 2. Retrocardiac opacity, likely atelectasis.   Electronically Signed   By: Jorje Guild M.D.   On: 04/07/2013 03:31    EKG: Independently reviewed. unchanged  Assessment/Plan Active Problems:   Acute on chronic combined systolic and diastolic congestive heart failure, NYHA class 4   Hypertension   Diabetes mellitus without complication   CKD (chronic kidney disease) stage 3, GFR 30-59 ml/min   Physical deconditioning   Gout   CHF (congestive heart failure)   1. Acute systolic CHF- EF 01%: IV lasix, monitor i/os, daily weights, BNP elevated 2. Joint pain- ?  Gout- uric acid is elevated, continue home meds and pain meds 3. DM- SSI, titrate as necessary 4. CKD 3- baseline, watch with diuresis 5. Physical deconditioning- PT- recent SNF stay 6. HTN- resume home meds 7. Hypokalemia-replete    Code Status: presumed full- will need to address when patient has not gotten dilaudid Family Communication: patient- no family at bedside Disposition Plan: admit  Time spent: 75 min  Eulogio Bear [facility] Pager [phone]

## 2013-04-08 DIAGNOSIS — J9600 Acute respiratory failure, unspecified whether with hypoxia or hypercapnia: Secondary | ICD-10-CM

## 2013-04-08 DIAGNOSIS — I5041 Acute combined systolic (congestive) and diastolic (congestive) heart failure: Secondary | ICD-10-CM

## 2013-04-08 DIAGNOSIS — N179 Acute kidney failure, unspecified: Secondary | ICD-10-CM

## 2013-04-08 LAB — URINE CULTURE
COLONY COUNT: NO GROWTH
Culture: NO GROWTH

## 2013-04-08 LAB — GLUCOSE, CAPILLARY
GLUCOSE-CAPILLARY: 135 mg/dL — AB (ref 70–99)
Glucose-Capillary: 142 mg/dL — ABNORMAL HIGH (ref 70–99)
Glucose-Capillary: 122 mg/dL — ABNORMAL HIGH (ref 70–99)
Glucose-Capillary: 183 mg/dL — ABNORMAL HIGH (ref 70–99)

## 2013-04-08 LAB — BASIC METABOLIC PANEL
BUN: 59 mg/dL — ABNORMAL HIGH (ref 6–23)
CALCIUM: 9.2 mg/dL (ref 8.4–10.5)
CHLORIDE: 96 meq/L (ref 96–112)
CO2: 28 meq/L (ref 19–32)
Creatinine, Ser: 1.61 mg/dL — ABNORMAL HIGH (ref 0.50–1.35)
GFR calc Af Amer: 51 mL/min — ABNORMAL LOW (ref >90)
GFR calc non Af Amer: 44 mL/min — ABNORMAL LOW (ref >90)
Glucose, Bld: 163 mg/dL — ABNORMAL HIGH (ref 70–99)
Potassium: 3.9 mEq/L (ref 3.7–5.3)
Sodium: 141 mEq/L (ref 137–147)

## 2013-04-08 LAB — CBC
HEMATOCRIT: 36.8 % — AB (ref 39.0–52.0)
Hemoglobin: 11.8 g/dL — ABNORMAL LOW (ref 13.0–17.0)
MCH: 25.9 pg — AB (ref 26.0–34.0)
MCHC: 32.1 g/dL (ref 30.0–36.0)
MCV: 80.9 fL (ref 78.0–100.0)
PLATELETS: 112 10*3/uL — AB (ref 150–400)
RBC: 4.55 MIL/uL (ref 4.22–5.81)
RDW: 18.4 % — AB (ref 11.5–15.5)
WBC: 10.7 10*3/uL — ABNORMAL HIGH (ref 4.0–10.5)

## 2013-04-08 MED ORDER — POLYETHYLENE GLYCOL 3350 17 G PO PACK
17.0000 g | PACK | Freq: Every day | ORAL | Status: DC
  Administered 2013-04-08 – 2013-04-10 (×3): 17 g via ORAL
  Filled 2013-04-08 (×4): qty 1

## 2013-04-08 MED ORDER — GUAIFENESIN ER 600 MG PO TB12
600.0000 mg | ORAL_TABLET | Freq: Two times a day (BID) | ORAL | Status: DC | PRN
  Administered 2013-04-09: 600 mg via ORAL
  Filled 2013-04-08: qty 1

## 2013-04-08 MED ORDER — LISINOPRIL 5 MG PO TABS
5.0000 mg | ORAL_TABLET | Freq: Every day | ORAL | Status: DC
  Administered 2013-04-08 – 2013-04-11 (×4): 5 mg via ORAL
  Filled 2013-04-08 (×4): qty 1

## 2013-04-08 NOTE — Care Management Note (Deleted)
    Page 1 of 2   04/08/2013     11:17:40 AM   CARE MANAGEMENT NOTE 04/08/2013  Patient:  Caleb Bauer, Caleb Bauer   Account Number:  [id number]  Date Initiated:  04/08/2013  Documentation initiated by:  [facility]  Subjective/Objective Assessment:   63 year old male transferred from [facility] for management of congestive heart failure and pneumonia.//HOME WITH SPOUSE     Action/Plan:   HCAPNA abx per pharmacy, nebs PRN, IV lasix, abx and albuterol with improvement/HOME WITH HOME HEALTH   Anticipated DC Date:  04/10/2013   Anticipated DC Plan:  [place]  CM consult      [facility] Choice  HOME HEALTH  Resumption Of Svcs/PTA Provider   Choice offered to / List presented to:  C-3 Spouse        HH arranged  HH-1 RN  HH-10 DISEASE MANAGEMENT  HH-2 PT  HH-3 OT      [place] agency  [facility]   Status of service:  In process, will continue to follow Medicare Important Message given?   (If response is "NO", the following Medicare IM given date fields will be blank) Date Medicare IM given:   Date Additional Medicare IM given:    Discharge Disposition:    Per UR Regulation:  Reviewed for med. necessity/level of care/duration of stay  If discussed at [place] of Stay Meetings, dates discussed:    Comments:  04/08/13 1030 Fuller Mandril, RN, BSN, [place] [phone] Pt currently active with [facility] for RNservices and [facility] for PT/OT services.  Resumption of care requested.  [facility] and [facility] agencies notified of his addmission and tentative discharge.  NCM will fax D/C orders and summary to [phone] ([facility])on date of discharge.  No DME needs identified at this time- RN will walk pt to qualify him for home O2.  NCM will follow-up.

## 2013-04-08 NOTE — Progress Notes (Signed)
Pt VSS, Pt stable. COPD education reviewed with pt.

## 2013-04-08 NOTE — Progress Notes (Signed)
i co-sign Dur[place] Nora assessments and notes

## 2013-04-08 NOTE — Progress Notes (Signed)
[facility] PROGRESS NOTE Interim History: 63 y.o. male  Who has recently received dilaudid and can provide only minimal history. He was hospitalized in January under PCCM. He was discharge to [facility] but has since left there and moved into an apartment. He walks with a walker. In the ER he was found to be hypoxic and placed on 4L O2. He was given lasix and solumedrol as well as a dose of dilaudid. His BNP was elevated as well as his Cr. His x ray showed cardiomegaly and pulm edema (mild).  Filed Weights   04/06/13 2133 04/07/13 0915  Weight: 104.327 kg (230 lb) 88.86 kg (195 lb 14.4 oz)        Intake/Output Summary (Last 24 hours) at 04/08/13 0849 Last data filed at 04/08/13 0500  Gross per 24 hour  Intake 720.17 ml  Output   1100 ml  Net -379.83 ml    Assessment/Plan:  Acute on chronic combined systolic and diastolic congestive heart failure, NYHA class 4: - On IV lasix, I guess previous weight was inaccurate, poor UOP. - Monitor electrolytes, daily standing weights, fluid restrict. - Cont ACE-I, not on BB as HR < 70.  Hypokalemia: - Replete due to lasix. - B-met in am.  Gout: - Steroids.  AKI on CKD (chronic kidney disease) stage 3, GFR 30-59 ml/min: - baseline Cr around 1.2. - ? If due to cardiorenal syndrome. Now improving with diuresis.   Hypertension: - High today. - Increasing lisinopril.    Code Status: presumed full- will need to address when patient has not gotten dilaudid  Family Communication: patient- no family at bedside  Disposition Plan: admit    Consultants:  none  Procedures: ECHO: none  Antibiotics:  None  HPI/Subjective: Relates his SOB is better.  Objective: Filed Vitals:   04/07/13 0915 04/07/13 1334 04/07/13 2119 04/08/13 0600  BP: 131/81  103/69 142/86  Pulse: 79  65 67  Temp: 98.1 F (36.7 C)  98.6 F (37 C) 98.6 F (37 C)  TempSrc: Oral  Oral Oral  Resp: _0 Height:      Weight: 88.86 kg (195 lb 14.4 oz)      SpO2: 92% 96% 97% 95%     Exam:  General: Alert, awake, oriented x3, in no acute distress.  HEENT: No bruits, no goiter. +JVD Heart: Regular rate and rhythm, without murmurs, rubs, gallops.  Lungs: Good air movement, clear to auscultation Abdomen: Soft, nontender, nondistended, positive bowel sounds.    Data Reviewed: Basic Metabolic Panel:  Recent Labs Lab 04/07/13 0223 04/08/13 0302  NA 141 141  K 3.3* 3.9  CL 96 96  CO2 28 28  GLUCOSE 111* 163*  BUN 44* 59*  CREATININE 1.74* 1.61*  CALCIUM 9.2 9.2   Liver Function Tests:  Recent Labs Lab 04/07/13 0223  AST 17  ALT 7  ALKPHOS 72  BILITOT 1.5*  PROT 8.7*  ALBUMIN 2.7*   No results found for this basename: LIPASE, AMYLASE,  in the last 168 hours No results found for this basename: AMMONIA,  in the last 168 hours CBC:  Recent Labs Lab 04/07/13 0223 04/08/13 0302  WBC 10.1 10.7*  HGB 12.3* 11.8*  HCT 38.7* 36.8*  MCV 80.5 80.9  PLT 100* 112*   Cardiac Enzymes: No results found for this basename: CKTOTAL, CKMB, CKMBINDEX, TROPONINI,  in the last 168 hours BNP (last 3 results)  Recent Labs  12/06/12 1618 03/23/13 1255 04/07/13 0223  PROBNP 16524.0* >5000.0*  30408.0*   CBG:  Recent Labs Lab 04/07/13 1115 04/07/13 1638 04/07/13 2116 04/08/13 0647  GLUCAP 161* 234* 166* 142*    No results found for this or any previous visit (from the past 240 hour(s)).   Studies: Dg Chest 2 View  04/07/2013   CLINICAL DATA:  Chest pain.  EXAM: CHEST  2 VIEW  COMPARISON:  03/23/2013  FINDINGS: Marked cardiopericardial enlargement, which is chronic. There is pulmonary venous congestion without overt edema. Streaky retrocardiac opacification which was also seen previously, likely atelectasis from the cardiomegaly.  IMPRESSION: 1. Cardiomegaly and pulmonary venous hypertension. 2. Retrocardiac opacity, likely atelectasis.   Electronically Signed   By: Jorje Guild M.D.   On: 04/07/2013 03:31    Scheduled  Meds: . allopurinol  100 mg Oral Daily  . amiodarone  200 mg Oral Daily  . apixaban  5 mg Oral Q12H  . aspirin EC  81 mg Oral Daily  . carvedilol  3.125 mg Oral BID WC  . colchicine  0.6 mg Oral Daily  . furosemide  40 mg Intravenous BID  . insulin aspart  0-5 Units Subcutaneous QHS  . insulin aspart  0-9 Units Subcutaneous TID WC  . isosorbide dinitrate  10 mg Oral TID  . lisinopril  2.5 mg Oral Daily  . potassium chloride  40 mEq Oral Daily   Continuous Infusions:    Charlynne Cousins  [facility] Pager [phone] If 8PM-8AM, please contact night-coverage at [email or web address], password [facility] 04/08/2013, 8:49 AM  LOS: 1 day

## 2013-04-08 NOTE — Care Management Note (Signed)
   CARE MANAGEMENT NOTE 04/08/2013  Patient:  RONON, FERGER   Account Number:  [id number]  Date Initiated:  04/08/2013  Documentation initiated by:  [facility]  Subjective/Objective Assessment:   63 yo male; presented to the hospital today with right ankle/knee and neck pain.  He thinks it is gout.  He has an EF of 25% but denies SOB; hypoxic in ER  //HOME WITH NIECE     Action/Plan:   placed on 4L O2.  He was given lasix and solumedrol as well as a dose of dilaudid.  [place]   Anticipated DC Date:  04/10/2013   Anticipated DC Plan:  [place] [facility]  CM consult      [facility] Choice  HOME HEALTH   Choice offered to / List presented to:  C-1 Patient        [place] arranged  HH-1 RN  [place] PT      [place].   Status of service:  In process, will continue to follow Medicare Important Message given?   (If response is "NO", the following Medicare IM given date fields will be blank) Date Medicare IM given:   Date Additional Medicare IM given:    Discharge Disposition:    Per UR Regulation:  Reviewed for med. necessity/level of care/duration of stay  If discussed at [place] of Stay Meetings, dates discussed:    Comments:  04/08/13 [place], [place], BSN, [place] [phone] Spoke with pt at bedside regarding discharge planning for [facility]. Offered pt list of home health agencies to choose from.  Pt chose [facility] to render services of RN/PT. Janae Sauce, RN of [facility] notified. No DME needs identified at this time.

## 2013-04-08 NOTE — Care Management Note (Addendum)
  Page 2 of 2   04/11/2013     5:41:43 PM   CARE MANAGEMENT NOTE 04/11/2013  Patient:  Caleb Bauer, Caleb Bauer   Account Number:  [id number]  Date Initiated:  04/08/2013  Documentation initiated by:  [facility]  Subjective/Objective Assessment:   63 yo male; presented to the hospital today with right ankle/knee and neck pain.  He thinks it is gout.  He has an EF of 25% but denies SOB; hypoxic in ER  //HOME WITH NIECE     Action/Plan:   placed on 4L O2.  He was given lasix and solumedrol as well as a dose of dilaudid.  [place]   Anticipated DC Date:  04/10/2013   Anticipated DC Plan:  [place] [facility]  CM consult      [facility] Choice  HOME HEALTH   Choice offered to / List presented to:  C-1 Patient        [place] arranged  HH-1 RN  [place] PT      [place].   Status of service:  Completed, signed off Medicare Important Message given?   (If response is "NO", the following Medicare IM given date fields will be blank) Date Medicare IM given:   Date Additional Medicare IM given:    Discharge Disposition:  [place]  Per UR Regulation:  Reviewed for med. necessity/level of care/duration of stay  If discussed at [place] of Stay Meetings, dates discussed:    Comments:  04/11/2013 dispositon:  home with HHS. ADD: today [name], RN, BSN, [facility], CCM 04/11/2013   04/08/13 [place], RN, BSN, [employer] [phone] Spoke with pt at bedside regarding discharge planning for [facility]. Offered pt list of home health agencies to choose from.  Pt chose [facility] to render services of RN/PT. Janae Sauce, RN of [facility] notified. No DME needs identified at this time.

## 2013-04-09 DIAGNOSIS — M109 Gout, unspecified: Secondary | ICD-10-CM

## 2013-04-09 DIAGNOSIS — I1310 Hypertensive heart and chronic kidney disease without heart failure, with stage 1 through stage 4 chronic kidney disease, or unspecified chronic kidney disease: Secondary | ICD-10-CM | POA: Diagnosis present

## 2013-04-09 DIAGNOSIS — N19 Unspecified kidney failure: Secondary | ICD-10-CM

## 2013-04-09 DIAGNOSIS — I119 Hypertensive heart disease without heart failure: Secondary | ICD-10-CM

## 2013-04-09 LAB — GLUCOSE, CAPILLARY
Glucose-Capillary: 99 mg/dL (ref 70–99)
Glucose-Capillary: 105 mg/dL — ABNORMAL HIGH (ref 70–99)
Glucose-Capillary: 104 mg/dL — ABNORMAL HIGH (ref 70–99)

## 2013-04-09 LAB — BASIC METABOLIC PANEL
BUN: 65 mg/dL — AB (ref 6–23)
CO2: 32 mEq/L (ref 19–32)
Calcium: 9.2 mg/dL (ref 8.4–10.5)
Chloride: 97 mEq/L (ref 96–112)
Creatinine, Ser: 1.39 mg/dL — ABNORMAL HIGH (ref 0.50–1.35)
GFR calc non Af Amer: 53 mL/min — ABNORMAL LOW (ref >90)
GFR, EST AFRICAN AMERICAN: 61 mL/min — AB (ref >90)
Glucose, Bld: 86 mg/dL (ref 70–99)
POTASSIUM: 4.4 meq/L (ref 3.7–5.3)
Sodium: 140 mEq/L (ref 137–147)

## 2013-04-09 MED ORDER — FUROSEMIDE 10 MG/ML IJ SOLN
60.0000 mg | Freq: Three times a day (TID) | INTRAMUSCULAR | Status: DC
  Administered 2013-04-09 – 2013-04-10 (×2): 60 mg via INTRAVENOUS
  Filled 2013-04-09 (×3): qty 6

## 2013-04-09 MED ORDER — PREDNISONE 20 MG PO TABS
20.0000 mg | ORAL_TABLET | Freq: Every day | ORAL | Status: DC
  Administered 2013-04-10 – 2013-04-11 (×2): 20 mg via ORAL
  Filled 2013-04-09 (×3): qty 1

## 2013-04-09 MED ORDER — POTASSIUM CHLORIDE CRYS ER 20 MEQ PO TBCR
40.0000 meq | EXTENDED_RELEASE_TABLET | Freq: Two times a day (BID) | ORAL | Status: AC
  Administered 2013-04-09 (×2): 40 meq via ORAL
  Filled 2013-04-09: qty 2

## 2013-04-09 MED ORDER — FUROSEMIDE 10 MG/ML IJ SOLN: 40.0000 mg | Freq: Three times a day (TID) | INTRAMUSCULAR | Status: DC

## 2013-04-09 MED ORDER — METOPROLOL TARTRATE 12.5 MG HALF TABLET: 12.5000 mg | ORAL_TABLET | Freq: Two times a day (BID) | ORAL | Status: DC

## 2013-04-09 MED ORDER — FUROSEMIDE 10 MG/ML IJ SOLN
4.0000 mg/h | INTRAVENOUS | Status: DC
  Filled 2013-04-09: qty 25

## 2013-04-09 NOTE — Progress Notes (Signed)
[facility] PROGRESS NOTE Interim History: 63 y.o. male  Who has recently received dilaudid and can provide only minimal history. He was hospitalized in January under PCCM. He was discharge to [facility] but has since left there and moved into an apartment. He walks with a walker. In the ER he was found to be hypoxic and placed on 4L O2. He was given lasix and solumedrol as well as a dose of dilaudid. His BNP was elevated as well as his Cr. His x ray showed cardiomegaly and pulm edema (mild).  Filed Weights   04/06/13 2133 04/07/13 0915 04/09/13 0521  Weight: 104.327 kg (230 lb) 88.86 kg (195 lb 14.4 oz) 94.3 kg (207 lb 14.3 oz)        Intake/Output Summary (Last 24 hours) at 04/09/13 9166 Last data filed at 04/09/13 0530  Gross per 24 hour  Intake   1078 ml  Output   1350 ml  Net   -272 ml    Assessment/Plan:  Acute on chronic combined systolic and diastolic congestive heart failure, NYHA class 4/Cardiorenal syndrome: - Started on Lasix IV with sluggish UOP, change to lasix ggt - poor urine output, Cr improving. - Monitor electrolytes, daily standing weights, fluid restrict. - Cont ACE-I, HR improving start low dose Bet blocker  Hypokalemia: - Replete due to lasix. - B-met in am.  Gout: - Steroids.  AKI on CKD (chronic kidney disease) stage 3, GFR 30-59 ml/min: - baseline Cr around 1.2. - ? If due to cardiorenal syndrome. Now improving with diuresis.   Hypertension: - BP cont to improve. - Cont lisinopril add low dose beta blocker.    Code Status: Full Family Communication: patient- no family at bedside  Disposition Plan: admit    Consultants:  none  Procedures: ECHO: none  Antibiotics:  None  HPI/Subjective: Relates his SOB is better.  Objective: Filed Vitals:   04/08/13 1715 04/08/13 2141 04/09/13 0145 04/09/13 0521  BP: 98/63 115/70 116/88 124/82  Pulse: 77 73 71 70  Temp:  97.4 F (36.3 C) 97.9 F (36.6 C) 97.6 F (36.4 C)  TempSrc:  Oral  Oral Oral  Resp:  18 20 18   Height:      Weight:    94.3 kg (207 lb 14.3 oz)  SpO2:  100% 97% 95%     Exam:  General: Alert, awake, oriented x3, in no acute distress.  HEENT: No bruits, no goiter. - JVD Heart: Regular rate and rhythm, without murmurs, rubs, gallops.  Lungs: Good air movement, clear to auscultation Abdomen: Soft, nontender, nondistended, positive bowel sounds.    Data Reviewed: Basic Metabolic Panel:  Recent Labs Lab 04/07/13 0223 04/08/13 0302  NA 141 141  K 3.3* 3.9  CL 96 96  CO2 28 28  GLUCOSE 111* 163*  BUN 44* 59*  CREATININE 1.74* 1.61*  CALCIUM 9.2 9.2   Liver Function Tests:  Recent Labs Lab 04/07/13 0223  AST 17  ALT 7  ALKPHOS 72  BILITOT 1.5*  PROT 8.7*  ALBUMIN 2.7*   No results found for this basename: LIPASE, AMYLASE,  in the last 168 hours No results found for this basename: AMMONIA,  in the last 168 hours CBC:  Recent Labs Lab 04/07/13 0223 04/08/13 0302  WBC 10.1 10.7*  HGB 12.3* 11.8*  HCT 38.7* 36.8*  MCV 80.5 80.9  PLT 100* 112*   Cardiac Enzymes: No results found for this basename: CKTOTAL, CKMB, CKMBINDEX, TROPONINI,  in the last 168 hours BNP (last 3  results)  Recent Labs  12/06/12 1618 03/23/13 1255 04/07/13 0223  PROBNP 16524.0* >5000.0* 30408.0*   CBG:  Recent Labs Lab 04/08/13 0647 04/08/13 1114 04/08/13 1632 04/08/13 2138 04/09/13 0621  GLUCAP 142* 122* 183* 135* 99    Recent Results (from the past 240 hour(s))  URINE CULTURE     Status: None   Collection Time    04/07/13  3:22 AM      Result Value Range Status   Specimen Description URINE, CLEAN CATCH   Final   Special Requests ADDED [id number] [phone]   Final   Culture  Setup Time     Final   Value: 04/07/2013 13:07     Performed at [facility]     Final   Value: NO GROWTH     Performed at [facility]   Culture     Final   Value: NO GROWTH     Performed at [facility]   Report Status  04/08/2013 FINAL   Final     Studies: No results found.  Scheduled Meds: . allopurinol  100 mg Oral Daily  . amiodarone  200 mg Oral Daily  . apixaban  5 mg Oral Q12H  . aspirin EC  81 mg Oral Daily  . carvedilol  3.125 mg Oral BID WC  . colchicine  0.6 mg Oral Daily  . furosemide  40 mg Intravenous BID  . insulin aspart  0-5 Units Subcutaneous QHS  . insulin aspart  0-9 Units Subcutaneous TID WC  . isosorbide dinitrate  10 mg Oral TID  . lisinopril  5 mg Oral Daily  . polyethylene glycol  17 g Oral Daily  . potassium chloride  40 mEq Oral Daily   Continuous Infusions:    Charlynne Cousins  [facility] Pager [phone] If 8PM-8AM, please contact night-coverage at [email or web address], password [facility] 04/09/2013, 9:06 AM  LOS: 2 days

## 2013-04-09 NOTE — Progress Notes (Signed)
Physical Therapy Treatment Patient Details Name: Caleb Bauer MRN: [id number] DOB: 1950-07-10 Today's Date: 04/09/2013 Time: 8315-1761 PT Time Calculation (min): 24 min  PT Assessment / Plan / Recommendation  History of Present Illness Pt admitted with R knee & ankle pain, hypoxia, CHF.    PT Comments   Patient making progress with gait distance.  Follow Up Recommendations  Home health PT     Does the patient have the potential to tolerate intense rehabilitation     Barriers to Discharge        Equipment Recommendations  None recommended by PT    Recommendations for Other Services    Frequency Min 3X/week   Progress towards PT Goals Progress towards PT goals: Progressing toward goals  Plan Current plan remains appropriate    Precautions / Restrictions Precautions Precautions: None Restrictions Weight Bearing Restrictions: No   Pertinent Vitals/Pain     Mobility  Transfers Overall transfer level: Needs assistance Equipment used: Rolling walker (2 wheeled) Transfers: Sit to/from Stand Sit to Stand: Supervision General transfer comment: VCs hand placement Ambulation/Gait Ambulation/Gait assistance: Min guard Ambulation Distance (Feet): 180 Feet Assistive device: Rolling walker (2 wheeled) Gait Pattern/deviations: Decreased step length - right;Decreased step length - left;Step-through pattern;Decreased weight shift to right;Antalgic;Trunk flexed Gait velocity: Very slow gait speed. Gait velocity interpretation: Below normal speed for age/gender General Gait Details: Patient with improved distance today.  Cues to stand upright and stay close to RW.  Patient with very slow gait, and required 2 standing rest breaks.     PT Goals (current goals can now be found in the care plan section)    Visit Information  Last PT Received On: 04/09/13 Assistance Needed: +1 History of Present Illness: Pt admitted with R knee & ankle pain, hypoxia, CHF.     Subjective Data  Subjective: "I can walk, it (leg) just hurts"   Cognition  Cognition Arousal/Alertness: Awake/alert Behavior During Therapy: WFL for tasks assessed/performed Overall Cognitive Status: Within Functional Limits for tasks assessed    Balance     End of Session PT - End of Session Equipment Utilized During Treatment: Gait belt Activity Tolerance: Patient limited by pain;Patient limited by fatigue Patient left: in chair;with call bell/phone within reach Nurse Communication: Mobility status   GP     Caleb Bauer 04/09/2013, 6:03 PM Caleb Bauer. Sanjuana Kava, [place] Pager [phone]

## 2013-04-09 NOTE — Plan of Care (Signed)
Problem: Phase I Progression Outcomes Goal: EF % per last Echo/documented,Core Reminder form on chart Outcome: Completed/Met Date Met:  04/09/13 20-25%

## 2013-04-09 NOTE — Progress Notes (Signed)
The patient's VS remained stable overnight and he did not have any acute changes.  He complained of pain in his neck this morning and received an Oxy IR.  The RN will continue to monitor the patient.

## 2013-04-10 LAB — BASIC METABOLIC PANEL
BUN: 56 mg/dL — ABNORMAL HIGH (ref 6–23)
CALCIUM: 9 mg/dL (ref 8.4–10.5)
CO2: 27 mEq/L (ref 19–32)
CREATININE: 1.24 mg/dL (ref 0.50–1.35)
Chloride: 96 mEq/L (ref 96–112)
GFR calc Af Amer: 70 mL/min — ABNORMAL LOW (ref >90)
GFR calc non Af Amer: 61 mL/min — ABNORMAL LOW (ref >90)
GLUCOSE: 101 mg/dL — AB (ref 70–99)
Potassium: 4.9 mEq/L (ref 3.7–5.3)
Sodium: 137 mEq/L (ref 137–147)

## 2013-04-10 LAB — GLUCOSE, CAPILLARY
GLUCOSE-CAPILLARY: 115 mg/dL — AB (ref 70–99)
GLUCOSE-CAPILLARY: 101 mg/dL — AB (ref 70–99)
GLUCOSE-CAPILLARY: 123 mg/dL — AB (ref 70–99)
Glucose-Capillary: 132 mg/dL — ABNORMAL HIGH (ref 70–99)
Glucose-Capillary: 159 mg/dL — ABNORMAL HIGH (ref 70–99)

## 2013-04-10 MED ORDER — FUROSEMIDE 10 MG/ML IJ SOLN: 80.0000 mg | Freq: Three times a day (TID) | INTRAMUSCULAR | Status: DC

## 2013-04-10 MED ORDER — FUROSEMIDE 10 MG/ML IJ SOLN
40.0000 mg | Freq: Three times a day (TID) | INTRAMUSCULAR | Status: DC
  Administered 2013-04-10 – 2013-04-11 (×3): 40 mg via INTRAVENOUS
  Filled 2013-04-10 (×3): qty 4

## 2013-04-10 NOTE — Progress Notes (Signed)
[facility] PROGRESS NOTE Interim History: 63 y.o. male  Who has recently received dilaudid and can provide only minimal history. He was hospitalized in January under PCCM. He was discharge to [facility] but has since left there and moved into an apartment. He walks with a walker. In the ER he was found to be hypoxic and placed on 4L O2. He was given lasix and solumedrol as well as a dose of dilaudid. His BNP was elevated as well as his Cr. His x ray showed cardiomegaly and pulm edema (mild).  Filed Weights   04/07/13 0915 04/09/13 0521 04/10/13 0431  Weight: 88.86 kg (195 lb 14.4 oz) 94.3 kg (207 lb 14.3 oz) 93.94 kg (207 lb 1.6 oz)        Intake/Output Summary (Last 24 hours) at 04/10/13 1021 Last data filed at 04/10/13 0759  Gross per 24 hour  Intake    940 ml  Output   3725 ml  Net  -2785 ml    Assessment/Plan:  Acute on chronic combined systolic and diastolic congestive heart failure, NYHA class 4/Cardiorenal syndrome: - Started on Lasix IV with sluggish UOP, increase lasix. - improved urine output, Cr improving. - Monitor electrolytes, daily standing weights, fluid restrict. - Cont ACE-I, HR improving start low dose Bet blocker  Neck pain: - MRI of neck. Relates shooting pain down his arm.  Hypokalemia: - Repleted, due to lasix. - B-met in am.  Gout: - Steroids.  AKI on CKD (chronic kidney disease) stage 3, GFR 30-59 ml/min: - baseline Cr around 1.2. - ? If due to cardiorenal syndrome. Now improving with diuresis.   Hypertension: - BP cont to improve. - Cont lisinopril add low dose beta blocker.    Code Status: Full Family Communication: patient- no family at bedside  Disposition Plan: admit    Consultants:  none  Procedures: ECHO: none  Antibiotics:  None  HPI/Subjective: Relates his SOB is better. Shooting pain down his arm  Objective: Filed Vitals:   04/09/13 1420 04/09/13 2152 04/10/13 0431 04/10/13 1002  BP: 110/67 112/70 126/87 120/90   Pulse: 66 61 68 68  Temp: 97.5 F (36.4 C) 97.3 F (36.3 C) 98 F (36.7 C)   TempSrc: Oral Oral Oral   Resp: 18 18 18 16   Height:      Weight:   93.94 kg (207 lb 1.6 oz)   SpO2: 100% 99% 98% 100%     Exam:  General: Alert, awake, oriented x3, in no acute distress.  HEENT: No bruits, no goiter. - JVD Heart: Regular rate and rhythm, without murmurs, rubs, gallops.  Lungs: Good air movement, clear to auscultation Abdomen: Soft, nontender, nondistended, positive bowel sounds.    Data Reviewed: Basic Metabolic Panel:  Recent Labs Lab 04/07/13 0223 04/08/13 0302 04/09/13 1225 04/10/13 0454  NA 141 141 140 137  K 3.3* 3.9 4.4 4.9  CL 96 96 97 96  CO2 28 28 32 27  GLUCOSE 111* 163* 86 101*  BUN 44* 59* 65* 56*  CREATININE 1.74* 1.61* 1.39* 1.24  CALCIUM 9.2 9.2 9.2 9.0   Liver Function Tests:  Recent Labs Lab 04/07/13 0223  AST 17  ALT 7  ALKPHOS 72  BILITOT 1.5*  PROT 8.7*  ALBUMIN 2.7*   No results found for this basename: LIPASE, AMYLASE,  in the last 168 hours No results found for this basename: AMMONIA,  in the last 168 hours CBC:  Recent Labs Lab 04/07/13 0223 04/08/13 0302  WBC 10.1 10.7*  HGB 12.3* 11.8*  HCT 38.7* 36.8*  MCV 80.5 80.9  PLT 100* 112*   Cardiac Enzymes: No results found for this basename: CKTOTAL, CKMB, CKMBINDEX, TROPONINI,  in the last 168 hours BNP (last 3 results)  Recent Labs  12/06/12 1618 03/23/13 1255 04/07/13 0223  PROBNP 16524.0* >5000.0* 30408.0*   CBG:  Recent Labs Lab 04/09/13 0621 04/09/13 1053 04/09/13 1559 04/09/13 2120 04/10/13 0553  GLUCAP 99 105* 104* 115* 101*    Recent Results (from the past 240 hour(s))  URINE CULTURE     Status: None   Collection Time    04/07/13  3:22 AM      Result Value Range Status   Specimen Description URINE, CLEAN CATCH   Final   Special Requests ADDED [id number] [phone]   Final   Culture  Setup Time     Final   Value: 04/07/2013 13:07     Performed at [facility]     Final   Value: NO GROWTH     Performed at [facility]   Culture     Final   Value: NO GROWTH     Performed at [facility]   Report Status 04/08/2013 FINAL   Final     Studies: No results found.  Scheduled Meds: . allopurinol  100 mg Oral Daily  . amiodarone  200 mg Oral Daily  . apixaban  5 mg Oral Q12H  . aspirin EC  81 mg Oral Daily  . carvedilol  3.125 mg Oral BID WC  . colchicine  0.6 mg Oral Daily  . furosemide  80 mg Intravenous Q8H  . insulin aspart  0-5 Units Subcutaneous QHS  . insulin aspart  0-9 Units Subcutaneous TID WC  . isosorbide dinitrate  10 mg Oral TID  . lisinopril  5 mg Oral Daily  . polyethylene glycol  17 g Oral Daily  . predniSONE  20 mg Oral Q breakfast   Continuous Infusions:    Charlynne Cousins  [facility] Pager [phone] If 8PM-8AM, please contact night-coverage at [email or web address], password [facility] 04/10/2013, 10:21 AM  LOS: 3 days

## 2013-04-11 LAB — GLUCOSE, CAPILLARY
Glucose-Capillary: 102 mg/dL — ABNORMAL HIGH (ref 70–99)
Glucose-Capillary: 113 mg/dL — ABNORMAL HIGH (ref 70–99)

## 2013-04-11 LAB — BASIC METABOLIC PANEL
BUN: 54 mg/dL — AB (ref 6–23)
CO2: 29 meq/L (ref 19–32)
CREATININE: 1.33 mg/dL (ref 0.50–1.35)
Calcium: 9.3 mg/dL (ref 8.4–10.5)
Chloride: 92 mEq/L — ABNORMAL LOW (ref 96–112)
GFR calc Af Amer: 65 mL/min — ABNORMAL LOW (ref >90)
GFR, EST NON AFRICAN AMERICAN: 56 mL/min — AB (ref >90)
Glucose, Bld: 201 mg/dL — ABNORMAL HIGH (ref 70–99)
Potassium: 5.2 mEq/L (ref 3.7–5.3)
SODIUM: 137 meq/L (ref 137–147)

## 2013-04-11 MED ORDER — TORSEMIDE 20 MG PO TABS: 40.0000 mg | ORAL_TABLET | Freq: Two times a day (BID) | ORAL | Status: DC

## 2013-04-11 MED ORDER — SPIRONOLACTONE 25 MG PO TABS: 25.0000 mg | ORAL_TABLET | Freq: Every day | ORAL | Status: DC

## 2013-04-11 NOTE — Progress Notes (Signed)
Physical Therapy Treatment Patient Details Name: Caleb Bauer MRN: [id number] DOB: 23-Aug-1950 Today's Date: 04/11/2013 Time: 0945-1000 PT Time Calculation (min): 15 min  PT Assessment / Plan / Recommendation  History of Present Illness Pt admitted with R knee & ankle pain, hypoxia, CHF.    PT Comments   Pt making good progress with PT.  Began stair training today with MIN/Guard A and heavy use of rail.  Follow Up Recommendations  Home health PT     Does the patient have the potential to tolerate intense rehabilitation     Barriers to Discharge        Equipment Recommendations  None recommended by PT    Recommendations for Other Services    Frequency Min 3X/week   Progress towards PT Goals Progress towards PT goals: Progressing toward goals  Plan Current plan remains appropriate    Precautions / Restrictions     Pertinent Vitals/Pain Pt with vague c/o R knee pain    Mobility  Bed Mobility Overal bed mobility: Modified Independent Transfers Overall transfer level: Modified independent Ambulation/Gait Ambulation/Gait assistance: Min guard Ambulation Distance (Feet): 200 Feet Assistive device: Rolling walker (2 wheeled) Gait Pattern/deviations: Antalgic;Decreased stance time - right;Step-through pattern Gait velocity: decreased Stairs: Yes Stairs assistance: Min guard Stair Management: Sideways;One rail Right;Step to pattern Number of Stairs: 5 General stair comments: Pt instructed in step to pattern technique due to R knee pain.  Heavy use of rail.    Exercises     PT Diagnosis:    PT Problem List:   PT Treatment Interventions:     PT Goals (current goals can now be found in the care plan section) Acute Rehab PT Goals Potential to Achieve Goals: Good  Visit Information  Last PT Received On: 04/11/13 Assistance Needed: +1 History of Present Illness: Pt admitted with R knee & ankle pain, hypoxia, CHF.     Subjective Data  Subjective: "I always have someone  with me when I am doing the stairs"   Cognition  Cognition Arousal/Alertness: Awake/alert Behavior During Therapy: WFL for tasks assessed/performed Overall Cognitive Status: Within Functional Limits for tasks assessed    Balance     End of Session PT - End of Session Equipment Utilized During Treatment: Gait belt Activity Tolerance: Patient tolerated treatment well Patient left: in chair;with call bell/phone within reach   GP     [place] 04/11/2013, 10:08 AM

## 2013-04-11 NOTE — Progress Notes (Signed)
Pt stable, VSS. Pt D/C'd home with niece.  D/C instructions given to pt.

## 2013-04-11 NOTE — Progress Notes (Signed)
UR completed Annamaria Salah K. Achsah Mcquade, RN, BSN, MSHL, CCM  04/11/2013 5:42 PM

## 2013-04-11 NOTE — Discharge Summary (Signed)
Physician Discharge Summary  BRAXTEN MEMMER RCV:[id number] DOB: 10-24-1950 DOA: 04/07/2013  PCP: Laverda Page, MD  Admit date: 04/07/2013 Discharge date: 04/11/2013  Time spent: 35 minutes  Recommendations for Outpatient Follow-up:  1. Follow up with cardiology in 1 week.  BNP    Component Value Date/Time   PROBNP 30408.0* 04/07/2013 0223   Filed Weights   04/09/13 0521 04/10/13 0431 04/11/13 1025  Weight: 94.3 kg (207 lb 14.3 oz) 93.94 kg (207 lb 1.6 oz) 92.7 kg (204 lb 5.9 oz)     Discharge Diagnoses:  Active Problems:   Acute on chronic combined systolic and diastolic congestive heart failure, NYHA class 4   Hypertensive heart disease   Diabetes mellitus without complication   CKD (chronic kidney disease) stage 3, GFR 30-59 ml/min   Physical deconditioning   Gout   Hypokalemia   Cardiorenal syndrome with renal failure   Discharge Condition: stable  Diet recommendation: heart healthy  History of present illness:  63 y.o. male  Who has recently received dilaudid and can provide only minimal history. He was hospitalized in January under PCCM. He was discharge to [facility] but has since left there and moved into an apartment. He walks with a walker.  He presented to the hospital today with right ankle/knee and neck pain. He thinks it is gout. He has an EF of 25% but denies SOB.  In the ER he was found to be hypoxic and placed on 4L O2. He was given lasix and solumedrol as well as a dose of dilaudid. His BNP was elevated as well as his Cr. His x ray showed cardiomegaly and pulm edema (mild)   Hospital Course:  Acute on chronic combined systolic and diastolic congestive heart failure, NYHA class 4/Cardiorenal syndrome:  - Started on Lasix IV, with Good UOP. - improved Cr. - Monitor electrolytes, daily standing weights, fluid restrict.  - Cont ACE-I, HR improving start low dose Bet blocker. - will resume diuretics on 2.11.2015.    Hypokalemia:  - Repleted, due to lasix.  -  B-met in am.   Gout:  - Steroids for 4 days.  AKI on CKD (chronic kidney disease) stage 3, GFR 30-59 ml/min:  - baseline Cr around 1.2.  - ? If due to cardiorenal syndrome. Now improving with diuresis.   Hypertension:  - BP cont to improve.  - Cont lisinopril add low dose beta blocker.  Procedures:  CXR  Consultations:  none  Discharge Exam: Filed Vitals:   04/11/13 1003  BP: 110/67  Pulse:   Temp:   Resp:     General: A&O x3 Cardiovascular: RRR Respiratory: good air movement CTA B/L  Discharge Instructions      Discharge Orders   Future Appointments Provider Department Dept Phone   04/21/2013 1:00 PM [facility] [phone]   04/21/2013 2:00 PM Brand Males, MD [facility] [phone]   Future Orders Complete By Expires   Diet - low sodium heart healthy  As directed    Increase activity slowly  As directed        Medication List    STOP taking these medications       meloxicam 15 MG tablet  Commonly known as:  MOBIC      TAKE these medications       allopurinol 100 MG tablet  Commonly known as:  ZYLOPRIM  Take 1 tablet (100 mg total) by mouth daily.     amiodarone 200 MG tablet  Commonly  known as:  PACERONE  Take 1 tablet (200 mg total) by mouth daily.     apixaban 5 MG Tabs tablet  Commonly known as:  ELIQUIS  Take 1 tablet (5 mg total) by mouth every 12 (twelve) hours.     aspirin EC 81 MG tablet  Take 81 mg by mouth daily.     carvedilol 3.125 MG tablet  Commonly known as:  COREG  Take 1 tablet (3.125 mg total) by mouth 2 (two) times daily with a meal.     colchicine 0.6 MG tablet  Take 1 tablet (0.6 mg total) by mouth daily.     isosorbide dinitrate 10 MG tablet  Commonly known as:  ISORDIL  Take 10 mg by mouth 3 (three) times daily.     lisinopril 2.5 MG tablet  Commonly known as:  PRINIVIL,ZESTRIL  Take 2.5 mg by mouth daily.     spironolactone 25 MG tablet  Commonly known  as:  ALDACTONE  Take 1 tablet (25 mg total) by mouth daily.  Start taking on:  04/13/2013     torsemide 20 MG tablet  Commonly known as:  DEMADEX  Take 2 tablets (40 mg total) by mouth 2 (two) times daily.  Start taking on:  04/13/2013       Allergies  Allergen Reactions  . Other Nausea And Vomiting    Patient stated drug is "kerein"   Follow-up Information   Follow up with Laverda Page, MD On 04/18/2013. (@1 :30 pm spoke with Maudry Mayhew)    Specialty:  Cardiology   Contact information:   [address] [place] [id number] [phone]        The results of significant diagnostics from this hospitalization (including imaging, microbiology, ancillary and laboratory) are listed below for reference.    Significant Diagnostic Studies: Dg Chest 2 View  04/07/2013   CLINICAL DATA:  Chest pain.  EXAM: CHEST  2 VIEW  COMPARISON:  03/23/2013  FINDINGS: Marked cardiopericardial enlargement, which is chronic. There is pulmonary venous congestion without overt edema. Streaky retrocardiac opacification which was also seen previously, likely atelectasis from the cardiomegaly.  IMPRESSION: 1. Cardiomegaly and pulmonary venous hypertension. 2. Retrocardiac opacity, likely atelectasis.   Electronically Signed   By: Jorje Guild M.D.   On: 04/07/2013 03:31   Dg Chest 2 View  03/23/2013   CLINICAL DATA:  Followup evaluation of pneumonia.  EXAM: CHEST  2 VIEW  COMPARISON:  Chest x-ray 03/15/2013.  FINDINGS: Bibasilar opacities (left greater than right) may reflect areas of atelectasis and/or consolidation. Probable small left pleural effusion (unchanged). No definite cephalization of the pulmonary vasculature. Heart size is mild moderately enlarged. Upper mediastinal contours are distorted by patient's rotation the left. Atherosclerosis in the thoracic aorta.  IMPRESSION: 1. Persistent bibasilar (left greater than right) atelectasis and/or consolidation with small left pleural effusion.  Findings are favored to reflect a multilobar pneumonia, as there is no cephalization of the pulmonary vasculature at this time to suggest underlying edema. 2. Mild to moderate cardiomegaly is unchanged. 3. Atherosclerosis.   Electronically Signed   By: Vinnie Langton M.D.   On: 03/23/2013 11:02   Dg Chest 2 View  03/13/2013   CLINICAL DATA:  CHF.  EXAM: CHEST  2 VIEW  COMPARISON:  03/12/2013  FINDINGS: The heart is enlarged but stable. The lungs show improved aeration with better lung volumes and more aerated lung. There are persistent patchy bilateral airspace opacities which could be residual edema or infiltrates. Persistent left  lower lobe process with airspace consolidation and pleural effusion.  IMPRESSION: Improved lung aeration with probable resolving pulmonary edema.  Patchy bilateral airspace opacities and persistent left lower lobe process.   Electronically Signed   By: Kalman Jewels M.D.   On: 03/13/2013 13:50   Dg Chest Port 1 View  03/15/2013   CLINICAL DATA:  Pulmonary edema  EXAM: PORTABLE CHEST - 1 VIEW  COMPARISON:  03/13/2013  FINDINGS: Mild progression of bilateral airspace disease. This may represent edema. There is atelectasis in the lung bases with a probable small left effusion. Cardiac enlargement is present.  IMPRESSION: Worsening bilateral airspace disease. Differential includes pulmonary edema and pulmonary infection.   Electronically Signed   By: Franchot Gallo M.D.   On: 03/15/2013 07:42    Microbiology: Recent Results (from the past 240 hour(s))  URINE CULTURE     Status: None   Collection Time    04/07/13  3:22 AM      Result Value Range Status   Specimen Description URINE, CLEAN CATCH   Final   Special Requests ADDED [id number] [phone]   Final   Culture  Setup Time     Final   Value: 04/07/2013 13:07     Performed at [facility]     Final   Value: NO GROWTH     Performed at [facility]   Culture     Final   Value: NO GROWTH      Performed at [facility]   Report Status 04/08/2013 FINAL   Final     Labs: Basic Metabolic Panel:  Recent Labs Lab 04/07/13 0223 04/08/13 0302 04/09/13 1225 04/10/13 0454 04/11/13 0940  NA 141 141 140 137 137  K 3.3* 3.9 4.4 4.9 5.2  CL 96 96 97 96 92*  CO2 28 28 32 27 29  GLUCOSE 111* 163* 86 101* 201*  BUN 44* 59* 65* 56* 54*  CREATININE 1.74* 1.61* 1.39* 1.24 1.33  CALCIUM 9.2 9.2 9.2 9.0 9.3   Liver Function Tests:  Recent Labs Lab 04/07/13 0223  AST 17  ALT 7  ALKPHOS 72  BILITOT 1.5*  PROT 8.7*  ALBUMIN 2.7*   No results found for this basename: LIPASE, AMYLASE,  in the last 168 hours No results found for this basename: AMMONIA,  in the last 168 hours CBC:  Recent Labs Lab 04/07/13 0223 04/08/13 0302  WBC 10.1 10.7*  HGB 12.3* 11.8*  HCT 38.7* 36.8*  MCV 80.5 80.9  PLT 100* 112*   Cardiac Enzymes: No results found for this basename: CKTOTAL, CKMB, CKMBINDEX, TROPONINI,  in the last 168 hours BNP: BNP (last 3 results)  Recent Labs  12/06/12 1618 03/23/13 1255 04/07/13 0223  PROBNP 16524.0* >5000.0* 30408.0*   CBG:  Recent Labs Lab 04/10/13 0553 04/10/13 1123 04/10/13 1632 04/10/13 2128 04/11/13 0603  GLUCAP 101* 123* 132* 159* 102*       Signed:  Charlynne Cousins  [facility] 04/11/2013, 11:11 AM

## 2013-04-18 DIAGNOSIS — B029 Zoster without complications: Secondary | ICD-10-CM

## 2013-04-18 HISTORY — DX: Zoster without complications: B02.9

## 2013-04-21 ENCOUNTER — Ambulatory Visit: Payer: Medicare Other | Admitting: Internal Medicine

## 2013-05-02 ENCOUNTER — Encounter (HOSPITAL_COMMUNITY): Payer: Self-pay

## 2013-05-16 ENCOUNTER — Encounter (HOSPITAL_COMMUNITY): Payer: Medicare Other

## 2013-05-19 ENCOUNTER — Ambulatory Visit (HOSPITAL_COMMUNITY)
Admission: RE | Admit: 2013-05-19 | Discharge: 2013-05-19 | Disposition: A | Discharge status: Home / Self Care | Payer: Medicare Other | Source: Ambulatory Visit | Attending: Internal Medicine | Admitting: Internal Medicine

## 2013-05-19 VITALS — BP 82/44 | HR 82 | Wt 195.5 lb

## 2013-05-19 DIAGNOSIS — I5022 Chronic systolic (congestive) heart failure: Secondary | ICD-10-CM | POA: Insufficient documentation

## 2013-05-19 MED ORDER — DIGOXIN 125 MCG PO TABS: 0.1250 mg | ORAL_TABLET | Freq: Every day | ORAL | Status: DC

## 2013-05-19 NOTE — Addendum Note (Signed)
Encounter addended by: Scarlette Calico, RN on: 05/19/2013  3:01 PM<BR>     Documentation filed: Orders

## 2013-05-19 NOTE — Patient Instructions (Signed)
Stop Isosorbide  Start Digoxin 0.125 mg daily  Labs in 2 weeks at Dr Irven Shelling office  Your physician has recommended that you have a cardiopulmonary stress test (CPX). CPX testing is a non-invasive measurement of heart and lung function. It replaces a traditional treadmill stress test. This type of test provides a tremendous amount of information that relates not only to your present condition but also for future outcomes. This test combines measurements of you ventilation, respiratory gas exchange in the lungs, electrocardiogram (EKG), blood pressure and physical response before, during, and following an exercise protocol.  Your physician recommends that you schedule a follow-up appointment in: 6 weeks

## 2013-05-19 NOTE — Addendum Note (Signed)
Encounter addended by: Scarlette Calico, RN on: 05/19/2013  2:57 PM<BR>     Documentation filed: Medications, Patient Instructions Section, Orders

## 2013-05-19 NOTE — Progress Notes (Signed)
Referring Physician: Einar Gip Primary Care: None Primary Cardiologist: Einar Gip   HPI:  Caleb Bauer is a 63 year old male with PMH of Systolic CHF (EF 21-30% in July 14 and 1/15), DM2, HTN, COPD (quit 6/14), AFL, CKD Stage 3, remote h/o poylsubstance abuse referred by Dr. Einar Gip for help with management of his HF.   Was admitted in January 2015 with sepsis and multiorgan failure. Went to [facility]. Was readmitted in early February for HF. He is here with his niece. He says he has been in the hospital almost every month.  Tells me he never had a heart cath or stress test. Weight on discharge was 204.   Says he is very complaint with his medications. Also weighs himself every day. Denies CP, SOB. Gets around ok uses a cane or walker. Says he has bad arthritis which limits him the most. Niece says when he watches his sodium he does great but if he doesn't he swells. Weight stable at 195. If weight goes up takes extra demadex. Occasionally dizzy when standing or after he takes pills. Has HHRN and BP always in 80s.   04/11/13: K 5.2 Cr 1.33  Review of Systems: [y] = yes, [ ]  = no   General: Weight gain [ ] ; Weight loss [ ] ; Anorexia [ ] ; Fatigue Blue.Reese ]; Fever [ ] ; Chills [ ] ; Weakness [ ]   Cardiac: Chest pain/pressure [ ] ; Resting SOB [ ] ; Exertional SOB [ ] ; Orthopnea [ ] ; Pedal Edema [ y]; Palpitations [ ] ; Syncope [ ] ; Presyncope [ ] ; Paroxysmal nocturnal dyspnea[ ]   Pulmonary: Cough [ ] ; Wheezing[ ] ; Hemoptysis[ ] ; Sputum [ ] ; Snoring [ ]   GI: Vomiting[ ] ; Dysphagia[ ] ; Melena[ ] ; Hematochezia [ ] ; Heartburn[ ] ; Abdominal pain [ ] ; Constipation [ ] ; Diarrhea [ ] ; BRBPR [ ]   GU: Hematuria[ ] ; Dysuria [ ] ; Nocturia[ ]   Vascular: Pain in legs with walking [ ] ; Pain in feet with lying flat [ ] ; Non-healing sores [ ] ; Stroke [ ] ; TIA [ ] ; Slurred speech [ ] ;  Neuro: Headaches[ ] ; Vertigo[ ] ; Seizures[ ] ; Paresthesias[ ] ;Blurred vision [ ] ; Diplopia [ ] ; Vision changes [ ]   Ortho/Skin: Arthritis [ y];  Joint pain Blue.Reese ]; Muscle pain [ ] ; Joint swelling Blue.Reese ]; Back Pain [ ] ; Rash [ ]   Psych: Depression[ ] ; Anxiety[ ]   Heme: Bleeding problems [ ] ; Clotting disorders [ ] ; Anemia [ ]   Endocrine: Diabetes Blue.Reese ]; Thyroid dysfunction[ ]    Past Medical History  Diagnosis Date  . Diabetes mellitus without complication   . CHF (congestive heart failure)   . Hypertension   . Cardiomyopathy, dilated   . Small bowel obstruction 10/2012  . COPD (chronic obstructive pulmonary disease)   . Herpes     BUTT AND BACK- 9/23 HEALED  . Atrial flutter by electrocardiogram 11/2012  . GERD (gastroesophageal reflux disease)   . Gout     Current Outpatient Prescriptions  Medication Sig Dispense Refill  . allopurinol (ZYLOPRIM) 100 MG tablet Take 1 tablet (100 mg total) by mouth daily.  30 tablet  3  . amiodarone (PACERONE) 200 MG tablet Take 1 tablet (200 mg total) by mouth daily.  30 tablet  0  . aspirin EC 81 MG tablet Take 81 mg by mouth daily.      . carvedilol (COREG) 3.125 MG tablet Take 1 tablet (3.125 mg total) by mouth 2 (two) times daily with a meal.      . colchicine 0.6 MG  tablet Take 1 tablet (0.6 mg total) by mouth daily.  20 tablet  0  . isosorbide dinitrate (ISORDIL) 10 MG tablet Take 10 mg by mouth 3 (three) times daily.       Marland Kitchen lisinopril (PRINIVIL,ZESTRIL) 2.5 MG tablet Take 2.5 mg by mouth daily.      Marland Kitchen spironolactone (ALDACTONE) 25 MG tablet Take 1 tablet (25 mg total) by mouth daily.      Marland Kitchen torsemide (DEMADEX) 20 MG tablet Take 2 tablets (40 mg total) by mouth 2 (two) times daily.       No current facility-administered medications for this encounter.   Facility-Administered Medications Ordered in Other Encounters  Medication Dose Route Frequency Provider Last Rate Last Dose  . propofol (DIPRIVAN) 10 mg/mL bolus/IV push    Anesthesia Intra-op Carter Kitten, CRNA   40 mg at 09/12/12 0000  . succinylcholine (ANECTINE) injection    Anesthesia Intra-op Carter Kitten, CRNA   100 mg at 09/12/12  0000    Allergies  Allergen Reactions  . Other Nausea And Vomiting    Patient stated drug is "kerein"    History   Social History  . Marital Status: Single    Spouse Name: N/A    Number of Children: N/A  . Years of Education: N/A   Occupational History  . Not on file.   Social History Main Topics  . Smoking status: Former Smoker -- 0.25 packs/day for .5 years    Types: Cigarettes    Quit date: 09/28/2012  . Smokeless tobacco: Never Used  . Alcohol Use: No  . Drug Use: No  . Sexual Activity: Not on file   Other Topics Concern  . Not on file   Social History Narrative  . No narrative on file    Family History  Problem Relation Age of Onset  . Cancer Neg Hx   No FHX of premature CAD or HF  PHYSICAL EXAM: Filed Vitals:   05/19/13 1400  BP: 82/44  Pulse: 82  Weight: 195 lb 8 oz (88.678 kg)  SpO2: 99%    General:  Well appearing. No respiratory difficulty HEENT: normal Neck: supple. no JVD. Carotids 2+ bilat; no bruits. No lymphadenopathy or thryomegaly appreciated. Cor: PMI nondisplaced. Regular rate & rhythm. No rubs, gallops or murmurs. ? Soft s3 Lungs: clear Abdomen: soft, nontender, nondistended. No hepatosplenomegaly. No bruits or masses. Good bowel sounds. Extremities: no cyanosis, clubbing, rash, edema Neuro: alert & oriented x 3, cranial nerves grossly intact. moves all 4 extremities w/o difficulty. Affect pleasant.  ASSESSMENT & PLAN:  1. Chronic systolic HF; EF 56-21% 2. DM2 3. COPD  Overall doing very well from HF perspective despite low BP. NYHA II-III. Volume status looks good and knows how to use sliding scale diuretic regimen. BP too soft to increase meds further. Would stop isosorbide and start digoxin 0.125mg  daily. Will need to follow level. We discussed possiblilty of heart cath to assess for CAD. I will d/w Dr. Einar Gip. We also discussed CPX testing and he says he can't walk easily on TM but would try bike. We will arrange.   [name] 2:47 PM

## 2013-05-24 ENCOUNTER — Ambulatory Visit (HOSPITAL_COMMUNITY): Payer: PRIVATE HEALTH INSURANCE | Attending: Internal Medicine

## 2013-05-24 DIAGNOSIS — I5022 Chronic systolic (congestive) heart failure: Secondary | ICD-10-CM

## 2013-05-24 NOTE — Progress Notes (Unsigned)
Patient ID: Caleb Bauer, male   DOB: 11/21/1950, 63 y.o.   MRN: [id number]   Pt here for CPX test today. After completing the PFTs developed dizziness, BP 60/42 R and 50/38 L. Stopped test. Started having dry heaves. SBP rechecked and it was 90s and patient reported feeling better. Will stop coreg for now and see patient back next week. Insturcted to call if any issues.   Junie Bame B NP-C 2:17 PM

## 2013-05-24 NOTE — Patient Instructions (Signed)
Stop your coreg for right now, do not throw it way just put aside.   Stop taking Isosorbide.  Follow up next week.   Call if any issues [phone]  Do the following things EVERYDAY: 1) Weigh yourself in the morning before breakfast. Write it down and keep it in a log. 2) Take your medicines as prescribed 3) Eat low salt foods-Limit salt (sodium) to 2000 mg per day.  4) Stay as active as you can everyday 5) Limit all fluids for the day to less than 2 liters 6)

## 2013-06-01 ENCOUNTER — Ambulatory Visit (HOSPITAL_COMMUNITY)
Admission: RE | Admit: 2013-06-01 | Discharge: 2013-06-01 | Disposition: A | Discharge status: Home / Self Care | Payer: Medicare Other | Source: Ambulatory Visit | Attending: Internal Medicine | Admitting: Internal Medicine

## 2013-06-01 ENCOUNTER — Telehealth (HOSPITAL_COMMUNITY): Payer: Self-pay | Admitting: *Deleted

## 2013-06-01 ENCOUNTER — Encounter (HOSPITAL_COMMUNITY): Payer: Self-pay

## 2013-06-01 VITALS — BP 76/54 | HR 94 | Ht 72.0 in | Wt 191.4 lb

## 2013-06-01 DIAGNOSIS — I4892 Unspecified atrial flutter: Secondary | ICD-10-CM | POA: Insufficient documentation

## 2013-06-01 DIAGNOSIS — J449 Chronic obstructive pulmonary disease, unspecified: Secondary | ICD-10-CM | POA: Insufficient documentation

## 2013-06-01 DIAGNOSIS — I5022 Chronic systolic (congestive) heart failure: Secondary | ICD-10-CM | POA: Insufficient documentation

## 2013-06-01 DIAGNOSIS — J4489 Other specified chronic obstructive pulmonary disease: Secondary | ICD-10-CM | POA: Insufficient documentation

## 2013-06-01 LAB — HEPATIC FUNCTION PANEL
ALT: 11 U/L (ref 0–53)
AST: 15 U/L (ref 0–37)
Albumin: 3.2 g/dL — ABNORMAL LOW (ref 3.5–5.2)
Alkaline Phosphatase: 51 U/L (ref 39–117)
BILIRUBIN TOTAL: 0.4 mg/dL (ref 0.3–1.2)
Total Protein: 10.1 g/dL — ABNORMAL HIGH (ref 6.0–8.3)

## 2013-06-01 LAB — BASIC METABOLIC PANEL
BUN: 77 mg/dL — AB (ref 6–23)
CALCIUM: 9.5 mg/dL (ref 8.4–10.5)
CO2: 14 mEq/L — ABNORMAL LOW (ref 19–32)
CREATININE: 2.79 mg/dL — AB (ref 0.50–1.35)
Chloride: 104 mEq/L (ref 96–112)
GFR calc Af Amer: 26 mL/min — ABNORMAL LOW (ref >90)
GFR, EST NON AFRICAN AMERICAN: 23 mL/min — AB (ref >90)
Glucose, Bld: 78 mg/dL (ref 70–99)
Potassium: 5.9 mEq/L — ABNORMAL HIGH (ref 3.7–5.3)
SODIUM: 135 meq/L — AB (ref 137–147)

## 2013-06-01 LAB — DIGOXIN LEVEL: Digoxin Level: 1.4 ng/mL (ref 0.8–2.0)

## 2013-06-01 MED ORDER — TORSEMIDE 20 MG PO TABS: 40.0000 mg | ORAL_TABLET | Freq: Every day | ORAL | Status: DC

## 2013-06-01 NOTE — Patient Instructions (Signed)
Stop Lisinopril  DO NOT TAKE TORSEMIDE TONIGHT OR IN THE AM  Labs today  Your physician recommends that you schedule a follow-up appointment in: 2 weeks

## 2013-06-01 NOTE — Telephone Encounter (Signed)
Called pt's niece with lab results, meds changed, pt sch for f/u appt 4/6

## 2013-06-01 NOTE — Progress Notes (Signed)
Patient ID: Caleb Bauer, male   DOB: Nov 10, 1950, 63 y.o.   MRN: [id number] Referring Physician: Einar Gip  HPI: [name] is a 63 year old male with PMH of Systolic CHF (EF 78-29% in July 14 and 1/15), DM2, HTN, COPD (quit 6/14), paroxysmal atrial flutter on amiodarone, CKD Stage 3, remote h/o poylsubstance abuse referred by Dr. Einar Gip for help with management of his HF.  He has no history of heavy ETOH use or cocaine abuse.   Was admitted in January 2015 with sepsis and multiorgan failure. Went to [facility]. Was readmitted in early February for HF. He is here with his niece. He says that he has never had a cath or a stress test. Weight is down 4 lbs since last appointment.  He was set up for CPX after last appointment but became lightheaded after doing PFTs and was found to have very low BP so did not complete CPX.   Patient has had SBP fairly consistently in the 80s when checked by home health.  Today, BP is 76/54.  He is not lightheaded in the office but states that he gets lightheaded if he stands too quickly.  He thinks that BP is low because he did not eat breakfast today. In terms of breathing, he has been doing very well.  No exertional dyspnea.  He used to use a walker and is now just using a cane.  He was on torsemide 40 mg bid, but this has been decreased to 20 mg bid with weight loss.   After patient left today, labs returned very abnormal.  K up to 5.9 and creatinine up to 2.79.   Labs (2/15): K 5.2 Cr 1.33 Labs (4/15): K 5.9, creatinine 2.79, digoxin 1.4  Review of Systems: All systems reviewed and negative except as per HPI.   Past Medical History  Diagnosis Date  . Diabetes mellitus without complication   . CHF (congestive heart failure)   . Hypertension   . Cardiomyopathy, dilated   . Small bowel obstruction 10/2012  . COPD (chronic obstructive pulmonary disease)   . Herpes     BUTT AND BACK- 9/23 HEALED  . Atrial flutter by electrocardiogram 11/2012  . GERD  (gastroesophageal reflux disease)   . Gout     Current Outpatient Prescriptions  Medication Sig Dispense Refill  . allopurinol (ZYLOPRIM) 100 MG tablet Take 1 tablet (100 mg total) by mouth daily.  30 tablet  3  . amiodarone (PACERONE) 200 MG tablet Take 1 tablet (200 mg total) by mouth daily.  30 tablet  0  . aspirin EC 81 MG tablet Take 81 mg by mouth daily.      . colchicine 0.6 MG tablet Take 1 tablet (0.6 mg total) by mouth daily.  20 tablet  0  . torsemide (DEMADEX) 20 MG tablet Take 2 tablets (40 mg total) by mouth daily.       No current facility-administered medications for this encounter.   Facility-Administered Medications Ordered in Other Encounters  Medication Dose Route Frequency Provider Last Rate Last Dose  . propofol (DIPRIVAN) 10 mg/mL bolus/IV push    Anesthesia Intra-op Carter Kitten, CRNA   40 mg at 09/12/12 0000  . succinylcholine (ANECTINE) injection    Anesthesia Intra-op Carter Kitten, CRNA   100 mg at 09/12/12 0000    Allergies  Allergen Reactions  . Other Nausea And Vomiting    Patient stated drug is "kerein"    History   Social History  .  Marital Status: Single    Spouse Name: N/A    Number of Children: N/A  . Years of Education: N/A   Occupational History  . Not on file.   Social History Main Topics  . Smoking status: Former Smoker -- 0.25 packs/day for .5 years    Types: Cigarettes    Quit date: 09/28/2012  . Smokeless tobacco: Never Used  . Alcohol Use: No  . Drug Use: No  . Sexual Activity: Not on file   Other Topics Concern  . Not on file   Social History Narrative  . No narrative on file    Family History  Problem Relation Age of Onset  . Cancer Neg Hx   No FHX of premature CAD or HF  PHYSICAL EXAM: Filed Vitals:   06/01/13 1210 06/01/13 1211  BP:  76/54  Pulse: 94   Height: 6' (1.829 m)   Weight: 191 lb 6.4 oz (86.818 kg)   SpO2: 96%     General:  Well appearing. No respiratory difficulty HEENT: normal Neck:  supple. no JVD. Carotids 2+ bilat; no bruits. No lymphadenopathy or thryomegaly appreciated. Cor: PMI nondisplaced. Regular rate & rhythm. No rubs, gallops or murmurs.  Lungs: clear Abdomen: soft, nontender, nondistended. No hepatosplenomegaly. No bruits or masses. Good bowel sounds. Extremities: no cyanosis, clubbing, rash, edema Neuro: alert & oriented x 3, cranial nerves grossly intact. moves all 4 extremities w/o difficulty. Affect pleasant.  ASSESSMENT & PLAN:  1. Chronic systolic HF: EF 00-71% with diffuse hypokinesis.  Etiology uncertain: No heavy ETOH or cocaine, no definite family history of cardiomyopathy.  NYHA class II symptoms but his blood pressure has been running very low.  He has mild lightheadedness with standing, no syncope.  It appears that he has been over-diuresed.  K is up to 5.9 and creatinine to 2.78.   - Stop lisinopril, spironolactone, and digoxin.   - Stop torsemide until Monday.  On Monday, will have him get BMET and repeat office visit. Will need to re-assess medical regimen at that time. Will likely restart him on torsemide 20 mg daily.  - Eventually will need LHC/RHC to look for CAD as cause of cardiomyopathy but need to straighten out renal function first.   - Report to ER if he develops increased lightheadedness.  2. Probable COPD: Patient has smoking history but COPD not formally diagnosed.  3. Atrial flutter: Paroxysmal.  In NSR today on amiodarone.  He is not anticoagulated.  LFTs and TSH sent today.   Brantlee Hinde,MD 06/02/2013

## 2013-06-02 ENCOUNTER — Ambulatory Visit: Payer: Medicare Other | Admitting: Family Medicine

## 2013-06-03 LAB — UIFE/LIGHT CHAINS/TP QN, 24-HR UR
ALPHA 1 UR: DETECTED — AB
Albumin, U: DETECTED
Alpha 2, Urine: DETECTED — AB
Beta, Urine: DETECTED — AB
FREE KAPPA LT CHAINS, UR: 70.5 mg/dL — AB (ref 0.14–2.42)
Free Kappa/Lambda Ratio: 5.73 ratio (ref 2.04–10.37)
Free Lambda Lt Chains,Ur: 12.3 mg/dL — ABNORMAL HIGH (ref 0.02–0.67)
GAMMA UR: DETECTED — AB
Total Protein, Urine: 84.8 mg/dL

## 2013-06-03 LAB — PROTEIN ELECTROPHORESIS, SERUM
ALBUMIN ELP: 35 % — AB (ref 55.8–66.1)
Alpha-1-Globulin: 5 % — ABNORMAL HIGH (ref 2.9–4.9)
Alpha-2-Globulin: 8.9 % (ref 7.1–11.8)
BETA 2: 16.3 % — AB (ref 3.2–6.5)
BETA GLOBULIN: 6.6 % (ref 4.7–7.2)
GAMMA GLOBULIN: 28.2 % — AB (ref 11.1–18.8)
M-Spike, %: 1.26 g/dL
Total Protein ELP: 9.5 g/dL — ABNORMAL HIGH (ref 6.0–8.3)

## 2013-06-06 ENCOUNTER — Ambulatory Visit (HOSPITAL_COMMUNITY)
Admission: RE | Admit: 2013-06-06 | Discharge: 2013-06-06 | Disposition: A | Discharge status: Home / Self Care | Payer: Medicare Other | Source: Ambulatory Visit | Attending: Internal Medicine | Admitting: Internal Medicine

## 2013-06-06 VITALS — BP 104/62 | HR 118 | Wt 192.5 lb

## 2013-06-06 DIAGNOSIS — I4892 Unspecified atrial flutter: Secondary | ICD-10-CM | POA: Diagnosis not present

## 2013-06-06 DIAGNOSIS — I5022 Chronic systolic (congestive) heart failure: Secondary | ICD-10-CM | POA: Insufficient documentation

## 2013-06-06 LAB — BASIC METABOLIC PANEL
BUN: 57 mg/dL — ABNORMAL HIGH (ref 6–23)
CO2: 17 meq/L — AB (ref 19–32)
Calcium: 9.5 mg/dL (ref 8.4–10.5)
Chloride: 106 mEq/L (ref 96–112)
Creatinine, Ser: 2 mg/dL — ABNORMAL HIGH (ref 0.50–1.35)
GFR calc Af Amer: 39 mL/min — ABNORMAL LOW (ref >90)
GFR calc non Af Amer: 34 mL/min — ABNORMAL LOW (ref >90)
GLUCOSE: 70 mg/dL (ref 70–99)
POTASSIUM: 5.4 meq/L — AB (ref 3.7–5.3)
SODIUM: 140 meq/L (ref 137–147)

## 2013-06-06 MED ORDER — AMIODARONE HCL 200 MG PO TABS: 200.0000 mg | ORAL_TABLET | Freq: Every day | ORAL | Status: AC

## 2013-06-06 MED ORDER — TORSEMIDE 20 MG PO TABS: 20.0000 mg | ORAL_TABLET | Freq: Every day | ORAL | Status: DC

## 2013-06-06 MED ORDER — CARVEDILOL 3.125 MG PO TABS: 3.1250 mg | ORAL_TABLET | Freq: Two times a day (BID) | ORAL | Status: DC

## 2013-06-06 MED ORDER — ALLOPURINOL 100 MG PO TABS: 100.0000 mg | ORAL_TABLET | Freq: Every day | ORAL | Status: DC

## 2013-06-06 NOTE — Progress Notes (Signed)
Patient ID: Caleb Bauer, male   DOB: [birth year], 63 y.o.   MRN: [id number] Referring Physician: Einar Bauer  HPI: Caleb Bauer is a 63 year old male with PMH of Systolic CHF (EF 56-38% in July 14 and 1/15), DM2, HTN, COPD (quit 6/14), paroxysmal atrial flutter on amiodarone, CKD Stage 3, remote h/o poylsubstance abuse referred by Dr. Einar Bauer for help with management of his HF.  He has no history of heavy ETOH use or cocaine abuse.   Was admitted in January 2015 with sepsis and multiorgan failure. Went to [facility]. Was readmitted in early February for HF. He says that he has not had a cath or a stress test.  He was set up for CPX after last appointment but became lightheaded after doing PFTs and was found to have very low BP so did not complete CPX.   Saw Dr. Aundra Bauer last week. SBP running low in 70-80s. Labs with evidence of hyperkalemia and acute renal failure (k 5.9 Cr 2.8). Dig level high (1.4). Torsemide, spiro and digoxin stopped.  Says he feels much better. No more dizziness. Breathing ok. No edema. Weight up 1 pound - but still below baseline. Weighing every day at home. Off all meds except ASA,   Labs (2/15): K 5.2 Cr 1.33 Labs (4/15): K 5.9, creatinine 2.79, digoxin 1.4  Review of Systems: All systems reviewed and negative except as per HPI.   Past Medical History  Diagnosis Date  . Diabetes mellitus without complication   . CHF (congestive heart failure)   . Hypertension   . Cardiomyopathy, dilated   . Small bowel obstruction 10/2012  . COPD (chronic obstructive pulmonary disease)   . Herpes     BUTT AND BACK- 9/23 HEALED  . Atrial flutter by electrocardiogram 11/2012  . GERD (gastroesophageal reflux disease)   . Gout     Current Outpatient Prescriptions  Medication Sig Dispense Refill  . aspirin EC 81 MG tablet Take 81 mg by mouth daily.       No current facility-administered medications for this encounter.   Facility-Administered Medications Ordered in Other Encounters   Medication Dose Route Frequency Provider Last Rate Last Dose  . propofol (DIPRIVAN) 10 mg/mL bolus/IV push    Anesthesia Intra-op Caleb Kitten, CRNA   40 mg at 09/12/12 0000  . succinylcholine (ANECTINE) injection    Anesthesia Intra-op Caleb Kitten, CRNA   100 mg at 09/12/12 0000    Allergies  Allergen Reactions  . Other Nausea And Vomiting    Patient stated drug is "kerein"    History   Social History  . Marital Status: Single    Spouse Name: N/A    Number of Children: N/A  . Years of Education: N/A   Occupational History  . Not on file.   Social History Main Topics  . Smoking status: Former Smoker -- 0.25 packs/day for .5 years    Types: Cigarettes    Quit date: 09/28/2012  . Smokeless tobacco: Never Used  . Alcohol Use: No  . Drug Use: No  . Sexual Activity: Not on file   Other Topics Concern  . Not on file   Social History Narrative  . No narrative on file    Family History  Problem Relation Age of Onset  . Cancer Neg Hx   No FHX of premature CAD or HF  PHYSICAL EXAM: Filed Vitals:   06/06/13 1325  BP: 104/62  Pulse: 118  Weight: 192 lb 8 oz (87.317 kg)  SpO2: 95%   General:. No respiratory difficulty HEENT: normal Neck: supple. evidence of previous tracheostomy  no JVD. Carotids 2+ bilat; no bruits. No lymphadenopathy or thryomegaly appreciated. Cor: PMI nondisplaced. Regular rate & rhythm. No rubs, gallops or murmurs.  Lungs: clear Abdomen: soft, nontender, nondistended. No hepatosplenomegaly. No bruits or masses. Good bowel sounds. Extremities: no cyanosis, clubbing, rash, edema Neuro: alert & oriented x 3, cranial nerves grossly intact. moves all 4 extremities w/o difficulty. Affect pleasant.  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 Chronic systolic HF: EF 20-94% with diffuse hypokinesis.  Etiology uncertain: No heavy ETOH or cocaine, no definite family history of cardiomyopathy.   - Blood pressure much improved today with  holding diuretics. NYHA II. Will restart low-dose carvedilol 3.125mg  bid and demadex 20 daily. Will hold demadex if weight 192 or less. Reinforced need for daily weights and reviewed use of sliding scale diuretics. Volume management continues to be a challenge. - Check labs today - Eventually will need CPX to assess functional capacity and LHC/RHC to look for CAD as cause of cardiomyopathy but need to straighten out renal function first.  Will d/w Dr. Einar Bauer.  2. Acute renal failure  - Due to overdiuresis. Will recheck labs today. Continue to hold ACE and spiro. Can consdier restarting gently at next visit.  3. Probable COPD: Patient has smoking history with previous prolonged hospitalization for respiratory failure (2011) with tracheostomy. No formal diagnosis of COPD. Saw Pulmonary who felt breathing issues mostly due to CHF. Will order PFTs.  4. Atrial flutter: Paroxysmal.  In NSR today. Resume amio at 200 daily.   He is not anticoagulated.   Caleb Bauer 06/06/2013

## 2013-06-06 NOTE — Patient Instructions (Signed)
RESTART: 1. Torsemide 20 mg daily, hold for wt less than 193 lb 2. Amiodarone 200 mg daily 3. Carvedilol 3.125 mg Twice daily  4. Allopurinol 100 mg daily  Labs today  Your physician recommends that you schedule a follow-up appointment in: 2 weeks

## 2013-06-07 ENCOUNTER — Telehealth (HOSPITAL_COMMUNITY): Payer: Self-pay | Admitting: *Deleted

## 2013-06-08 ENCOUNTER — Telehealth (HOSPITAL_COMMUNITY): Payer: Self-pay | Admitting: *Deleted

## 2013-06-08 NOTE — Telephone Encounter (Signed)
Called pt per Dr. Haroldine Laws and left message asking him to call office to confirm med changes.

## 2013-06-08 NOTE — Telephone Encounter (Signed)
Called pt per Dr. Haroldine Laws left message reviewing following med changes: stop digoxin, stop aldactone, take torsemide 20 mg daily, but hold if weight < 193 lbs. Asked pt to call office if any questions.

## 2013-06-13 ENCOUNTER — Other Ambulatory Visit (HOSPITAL_COMMUNITY): Payer: Self-pay | Admitting: *Deleted

## 2013-06-13 ENCOUNTER — Ambulatory Visit (HOSPITAL_COMMUNITY)
Admission: RE | Admit: 2013-06-13 | Discharge: 2013-06-13 | Disposition: A | Discharge status: Home / Self Care | Payer: Medicare Other | Source: Ambulatory Visit | Attending: Internal Medicine | Admitting: Internal Medicine

## 2013-06-13 ENCOUNTER — Other Ambulatory Visit (HOSPITAL_COMMUNITY): Payer: Self-pay | Admitting: Internal Medicine

## 2013-06-13 ENCOUNTER — Encounter (HOSPITAL_COMMUNITY): Payer: Self-pay | Admitting: Adult Health

## 2013-06-13 ENCOUNTER — Encounter (HOSPITAL_COMMUNITY): Payer: Medicare Other

## 2013-06-13 DIAGNOSIS — I5043 Acute on chronic combined systolic (congestive) and diastolic (congestive) heart failure: Secondary | ICD-10-CM | POA: Insufficient documentation

## 2013-06-13 DIAGNOSIS — I5022 Chronic systolic (congestive) heart failure: Secondary | ICD-10-CM

## 2013-06-13 DIAGNOSIS — M25529 Pain in unspecified elbow: Secondary | ICD-10-CM

## 2013-06-13 DIAGNOSIS — M79609 Pain in unspecified limb: Secondary | ICD-10-CM

## 2013-06-13 DIAGNOSIS — I509 Heart failure, unspecified: Secondary | ICD-10-CM | POA: Insufficient documentation

## 2013-06-13 DIAGNOSIS — M7989 Other specified soft tissue disorders: Secondary | ICD-10-CM

## 2013-06-13 DIAGNOSIS — D696 Thrombocytopenia, unspecified: Secondary | ICD-10-CM

## 2013-06-13 DIAGNOSIS — J449 Chronic obstructive pulmonary disease, unspecified: Secondary | ICD-10-CM

## 2013-06-13 LAB — BASIC METABOLIC PANEL
BUN: 60 mg/dL — AB (ref 6–23)
CHLORIDE: 101 meq/L (ref 96–112)
CO2: 20 meq/L (ref 19–32)
Calcium: 9.4 mg/dL (ref 8.4–10.5)
Creatinine, Ser: 2.51 mg/dL — ABNORMAL HIGH (ref 0.50–1.35)
GFR calc Af Amer: 30 mL/min — ABNORMAL LOW (ref >90)
GFR calc non Af Amer: 26 mL/min — ABNORMAL LOW (ref >90)
Glucose, Bld: 102 mg/dL — ABNORMAL HIGH (ref 70–99)
POTASSIUM: 4.4 meq/L (ref 3.7–5.3)
Sodium: 142 mEq/L (ref 137–147)

## 2013-06-13 LAB — URIC ACID: URIC ACID, SERUM: 13.2 mg/dL — AB (ref 4.0–7.8)

## 2013-06-13 MED ORDER — PREDNISONE 20 MG PO TABS: 40.0000 mg | ORAL_TABLET | Freq: Every day | ORAL | Status: DC

## 2013-06-13 MED ORDER — TORSEMIDE 20 MG PO TABS
20.0000 mg | ORAL_TABLET | ORAL | Status: DC
Start: 2013-06-13 — End: 2014-03-16

## 2013-06-13 NOTE — Progress Notes (Unsigned)
Patient ID: Caleb Bauer, male   DOB: 1950/06/25, 63 y.o.   MRN: [id number]  Asked by his niece to evaluate his  RUE edema. R hand edematous and painful to touch.  R hand edema has present for the last 4 days. R radial pulse 3+ . No open wound noted.   Check RUE doppler. Check uric acid level. If doppler ok and uric acid elevated will add prednisone. Will need to restart allopurinol at 200 mg daily.     Amy D Clegg NP-C  1:25 PM

## 2013-06-13 NOTE — Progress Notes (Signed)
Pt in for BMET today, however his right arm from shoulder down to hands is swollen and very painful, he denies injury or bug bites, he feels it is either gout or arthritis, Amy Clegg, NP looked at arm and ordered doppler to r/o dvt and also uric acid.  U/s negative for dvt, uric acid elevated at 13 and cr also elevated to 2.5 Dr Haroldine Laws reviewed and would like pt to take Prednisone 40 mg for 3 days and decrease Torsemide to take only on Mon and Fri keep f/u appt as sch on Mon 4/20, both pt and his niece are aware and agreeable, if not getting relief from prednisone will f/u with urgent care

## 2013-06-13 NOTE — Progress Notes (Signed)
Right upper extremity venous duplex:  No evidence of DVT or superficial thrombosis.    

## 2013-06-13 NOTE — Patient Instructions (Signed)
Prednisone 40 mg (2 tabs) daily for 3 days  Only take Torsemide on Monday and Friday  Keep appointment as scheduled

## 2013-06-20 ENCOUNTER — Encounter (HOSPITAL_COMMUNITY): Payer: Self-pay

## 2013-06-20 ENCOUNTER — Ambulatory Visit (HOSPITAL_COMMUNITY)
Admission: RE | Admit: 2013-06-20 | Discharge: 2013-06-20 | Disposition: A | Discharge status: Home / Self Care | Payer: Medicare Other | Source: Ambulatory Visit | Attending: Internal Medicine | Admitting: Internal Medicine

## 2013-06-20 VITALS — BP 98/61 | HR 103 | Resp 18 | Wt 188.0 lb

## 2013-06-20 DIAGNOSIS — N183 Chronic kidney disease, stage 3 unspecified: Secondary | ICD-10-CM | POA: Insufficient documentation

## 2013-06-20 DIAGNOSIS — J449 Chronic obstructive pulmonary disease, unspecified: Secondary | ICD-10-CM

## 2013-06-20 DIAGNOSIS — N189 Chronic kidney disease, unspecified: Principal | ICD-10-CM

## 2013-06-20 DIAGNOSIS — I1310 Hypertensive heart and chronic kidney disease without heart failure, with stage 1 through stage 4 chronic kidney disease, or unspecified chronic kidney disease: Secondary | ICD-10-CM

## 2013-06-20 DIAGNOSIS — M109 Gout, unspecified: Secondary | ICD-10-CM | POA: Insufficient documentation

## 2013-06-20 DIAGNOSIS — I509 Heart failure, unspecified: Secondary | ICD-10-CM | POA: Diagnosis not present

## 2013-06-20 DIAGNOSIS — I5022 Chronic systolic (congestive) heart failure: Secondary | ICD-10-CM | POA: Diagnosis present

## 2013-06-20 DIAGNOSIS — N19 Unspecified kidney failure: Secondary | ICD-10-CM

## 2013-06-20 DIAGNOSIS — I130 Hypertensive heart and chronic kidney disease with heart failure and stage 1 through stage 4 chronic kidney disease, or unspecified chronic kidney disease: Secondary | ICD-10-CM | POA: Diagnosis not present

## 2013-06-20 DIAGNOSIS — J4489 Other specified chronic obstructive pulmonary disease: Secondary | ICD-10-CM | POA: Insufficient documentation

## 2013-06-20 LAB — BASIC METABOLIC PANEL
BUN: 42 mg/dL — AB (ref 6–23)
CALCIUM: 9.6 mg/dL (ref 8.4–10.5)
CO2: 19 meq/L (ref 19–32)
CREATININE: 1.99 mg/dL — AB (ref 0.50–1.35)
Chloride: 103 mEq/L (ref 96–112)
GFR calc Af Amer: 40 mL/min — ABNORMAL LOW (ref >90)
GFR calc non Af Amer: 34 mL/min — ABNORMAL LOW (ref >90)
Glucose, Bld: 90 mg/dL (ref 70–99)
Potassium: 4 mEq/L (ref 3.7–5.3)
Sodium: 140 mEq/L (ref 137–147)

## 2013-06-20 MED ORDER — PREDNISONE 20 MG PO TABS: 40.0000 mg | ORAL_TABLET | Freq: Every day | ORAL | Status: DC

## 2013-06-20 MED ORDER — ALLOPURINOL 100 MG PO TABS: 100.0000 mg | ORAL_TABLET | Freq: Every day | ORAL | Status: DC

## 2013-06-20 NOTE — Patient Instructions (Addendum)
Take prednisone 40 mg (2 tablets) for 3 days.  Start Allopurinol 100 mg daily.  Will refer to Dr. Tobie Lords.  Will refer to nephrologist  F/U 2 weeks with MD.  Do the following things EVERYDAY: 1) Weigh yourself in the morning before breakfast. Write it down and keep it in a log. 2) Take your medicines as prescribed 3) Eat low salt foods-Limit salt (sodium) to 2000 mg per day.  4) Stay as active as you can everyday 5) Limit all fluids for the day to less than 2 liters 6)

## 2013-06-20 NOTE — Progress Notes (Addendum)
Referring Physician: Einar Gip PCP:   HPI: Mr. Kopf is a 63 year old male with PMH of Systolic CHF (EF 43-15% in July 14 and 1/15), DM2, HTN, COPD (quit 6/14), paroxysmal atrial flutter on amiodarone, CKD Stage 3, remote h/o poylsubstance abuse referred by Dr. Einar Gip for help with management of his HF. He has no history of heavy ETOH use or cocaine abuse.   Was admitted in January 2015 with sepsis and multiorgan failure. Went to [facility]. Was readmitted in early February for HF. He says that he has not had a cath or a stress test. He was set up for CPX after last appointment but became lightheaded after doing PFTs and was found to have very low BP so did not complete CPX.   Follow up: Last visit restarted low dose coreg 3.125 mg BID and torsemide 20 mg daily. We also restarted amiodarone 200 mg daily. He presented to the clinic 4/13 for BMET and R arm was swollen, US doppler ordered no DVT. Repeat BMET Cr still elevated and torsemide cut back to 20 mg on Mondays and Fridays. Doing ok. [place] this am and was not able to get up this morning because L lower arm swollen and painful Last week had R arm swelling. Denies SOB, orthopnea, PND or CP. Weight at home 184 lbs. No appetite. Following a low salt diet and drinking less than 2L a day.   Labs (2/15): K 5.2 Cr 1.33  Labs (4/15): K 5.9, creatinine 2.79, digoxin 1.4 Labs (4/15) K 4.4, creatinine 2.51, BUN 60, uric acid 13.2  ROS: All systems negative except as listed in HPI, PMH and Problem List.  Past Medical History  Diagnosis Date  . Diabetes mellitus without complication   . CHF (congestive heart failure)   . Hypertension   . Cardiomyopathy, dilated   . Small bowel obstruction 10/2012  . COPD (chronic obstructive pulmonary disease)   . Herpes     BUTT AND BACK- 9/23 HEALED  . Atrial flutter by electrocardiogram 11/2012  . GERD (gastroesophageal reflux disease)   . Gout     Current Outpatient Prescriptions  Medication Sig Dispense Refill   . allopurinol (ZYLOPRIM) 100 MG tablet Take 1 tablet (100 mg total) by mouth daily.      Marland Kitchen amiodarone (PACERONE) 200 MG tablet Take 1 tablet (200 mg total) by mouth daily.  90 tablet  3  . aspirin EC 81 MG tablet Take 81 mg by mouth daily.      . carvedilol (COREG) 3.125 MG tablet Take 1 tablet (3.125 mg total) by mouth 2 (two) times daily.  180 tablet  3  . torsemide (DEMADEX) 20 MG tablet Take 1 tablet (20 mg total) by mouth 2 (two) times a week. Every Monday and Friday, Hold for wt less than 193 lb  180 tablet  3   No current facility-administered medications for this encounter.   Facility-Administered Medications Ordered in Other Encounters  Medication Dose Route Frequency Provider Last Rate Last Dose  . propofol (DIPRIVAN) 10 mg/mL bolus/IV push    Anesthesia Intra-op Carter Kitten, CRNA   40 mg at 09/12/12 0000  . succinylcholine (ANECTINE) injection    Anesthesia Intra-op Carter Kitten, CRNA   100 mg at 09/12/12 0000      Filed Vitals:   06/20/13 1408  BP: 98/61  Pulse: 103  Resp: 18  Weight: 188 lb (85.276 kg)  SpO2: 96%    PHYSICAL EXAM: General: Chronically ill appearing; No respiratory difficulty;  niece present HEENT: normal  Neck: supple. evidence of previous tracheostomy no JVD. Carotids 2+ bilat; no bruits. No lymphadenopathy or thryomegaly appreciated.  Cor: PMI nondisplaced. Regular rate & rhythm. No rubs, gallops or murmurs.  Lungs: clear  Abdomen: soft, nontender, nondistended. No hepatosplenomegaly. No bruits or masses. Good bowel sounds.  Extremities: no cyanosis, clubbing, rash, edema: LLE swollen and L wrist warm to touch and erythematous   Neuro: alert & oriented x 3, cranial nerves grossly intact. moves all 4 extremities w/o difficulty. Affect pleasant.   ASSESSMENT & PLAN: 1. Chronic systolic HF: EF 00-86% with diffuse hypokinesis. Etiology uncertain: No heavy ETOH or cocaine, no definite family history of cardiomyopathy.  - He is currently on coreg  3.125 mg BID which he is tolerating, but is not on any other HF medications d/t hypotension and CKD. - Volume status stable will continue torsemide 20 mg on Mondays and Fridays. Hold if weight less than 192 lbs.  - Repeat BMET today. Will eventually need CPX, however with Gout flare not able to complete currently d/t arm and leg pain on L side. He also needs a L/RHC to assess hemodynamics and coronaries, however renal function has been an issue. As above will check BMET today and if stable will try to schedule.  - Reinforced the need and importance of daily weights, a low sodium diet, and fluid restriction (less than 2 L a day). Instructed to call the HF clinic if weight increases more than 3 lbs overnight or 5 lbs in a week.  2. CKD, stage III - Appears Cr baseline is 1.5-1.7. Will recheck today and refer to nephrology. Concerned that creatinine rising d/t low output, cardiorenal syndrome.  Not on ACE-I or Spiro  3. Probable COPD: Patient has smoking history with previous prolonged hospitalization for respiratory failure (2011) with tracheostomy. No formal diagnosis of COPD. Saw Pulmonary who felt breathing issues mostly due to CHF.   4. Atrial flutter: Paroxysmal. Appears to be in NSR today. Continue amio at 200 daily. He is not anticoagulated. 5. Gout Flare - Last week presented to clinic for BMET with swollen R arm. [place] negative for DVT and started on prednisone 40 mg x 3 days. R arm improved and no pain or swelling, but now L lower arm swollen, erythematous and painful. Will go another course of prednisone 40 mg x 3 days and start Allopurinol 100 mg daily. Refer to rheumatology.  Patient does not have PCP will see if SW can meet with patient and niece next visit in order to help find PCP and establish care.  F/U 2 weeks Rande Brunt NP-C 6:46 PM

## 2013-06-21 ENCOUNTER — Telehealth: Payer: Self-pay | Admitting: Licensed Clinical Social Worker

## 2013-06-21 NOTE — Telephone Encounter (Signed)
CSW referred to assist patient with obtaining a PCP. CSW contacted patient's home and spoke with his niece who assists with all medical appointments. Niece reports that patient has medicare and medicaid but no assigned PCP. CSW will meet with patient and niece on May 4 at 1pm prior to HF clinic appointment to explore options for PCP. Raquel Sarna, [place]

## 2013-06-23 ENCOUNTER — Telehealth (HOSPITAL_COMMUNITY): Payer: Self-pay | Admitting: Surgery

## 2013-06-23 NOTE — Telephone Encounter (Signed)
Called to inform patient of scheduled appt. at [facility] with Dr. Ouida Sills 08/09/13 at 1:00pm.  Niece asked that I call back and leave information on answering machine which I did.

## 2013-06-27 ENCOUNTER — Encounter (HOSPITAL_COMMUNITY): Payer: Medicare Other

## 2013-07-04 ENCOUNTER — Ambulatory Visit (HOSPITAL_COMMUNITY)
Admission: RE | Admit: 2013-07-04 | Discharge: 2013-07-04 | Disposition: A | Discharge status: Home / Self Care | Payer: Medicare Other | Source: Ambulatory Visit | Attending: Cardiology | Admitting: Cardiology

## 2013-07-04 VITALS — BP 84/58 | HR 95 | Wt 191.0 lb

## 2013-07-04 DIAGNOSIS — I509 Heart failure, unspecified: Secondary | ICD-10-CM | POA: Diagnosis not present

## 2013-07-04 DIAGNOSIS — E119 Type 2 diabetes mellitus without complications: Secondary | ICD-10-CM | POA: Insufficient documentation

## 2013-07-04 DIAGNOSIS — I428 Other cardiomyopathies: Secondary | ICD-10-CM | POA: Diagnosis not present

## 2013-07-04 DIAGNOSIS — N183 Chronic kidney disease, stage 3 unspecified: Secondary | ICD-10-CM | POA: Diagnosis not present

## 2013-07-04 DIAGNOSIS — I129 Hypertensive chronic kidney disease with stage 1 through stage 4 chronic kidney disease, or unspecified chronic kidney disease: Secondary | ICD-10-CM | POA: Diagnosis not present

## 2013-07-04 DIAGNOSIS — I4891 Unspecified atrial fibrillation: Secondary | ICD-10-CM | POA: Insufficient documentation

## 2013-07-04 DIAGNOSIS — K219 Gastro-esophageal reflux disease without esophagitis: Secondary | ICD-10-CM | POA: Diagnosis not present

## 2013-07-04 DIAGNOSIS — M109 Gout, unspecified: Secondary | ICD-10-CM | POA: Diagnosis not present

## 2013-07-04 DIAGNOSIS — J449 Chronic obstructive pulmonary disease, unspecified: Secondary | ICD-10-CM

## 2013-07-04 DIAGNOSIS — I4892 Unspecified atrial flutter: Secondary | ICD-10-CM

## 2013-07-04 DIAGNOSIS — J4489 Other specified chronic obstructive pulmonary disease: Secondary | ICD-10-CM

## 2013-07-04 DIAGNOSIS — I5022 Chronic systolic (congestive) heart failure: Secondary | ICD-10-CM | POA: Diagnosis present

## 2013-07-04 LAB — PRO B NATRIURETIC PEPTIDE: PRO B NATRI PEPTIDE: 7985 pg/mL — AB (ref 0–125)

## 2013-07-04 LAB — COMPREHENSIVE METABOLIC PANEL
ALT: 5 U/L (ref 0–53)
AST: 9 U/L (ref 0–37)
Albumin: 2.5 g/dL — ABNORMAL LOW (ref 3.5–5.2)
Alkaline Phosphatase: 66 U/L (ref 39–117)
BUN: 26 mg/dL — ABNORMAL HIGH (ref 6–23)
CO2: 21 mEq/L (ref 19–32)
CREATININE: 1.73 mg/dL — AB (ref 0.50–1.35)
Calcium: 9 mg/dL (ref 8.4–10.5)
Chloride: 103 mEq/L (ref 96–112)
GFR, EST AFRICAN AMERICAN: 47 mL/min — AB (ref >90)
GFR, EST NON AFRICAN AMERICAN: 41 mL/min — AB (ref >90)
Glucose, Bld: 76 mg/dL (ref 70–99)
Potassium: 4.1 mEq/L (ref 3.7–5.3)
Sodium: 140 mEq/L (ref 137–147)
TOTAL PROTEIN: 8.2 g/dL (ref 6.0–8.3)
Total Bilirubin: 0.8 mg/dL (ref 0.3–1.2)

## 2013-07-04 LAB — TSH: TSH: 1.64 u[IU]/mL (ref 0.350–4.500)

## 2013-07-04 MED ORDER — ALLOPURINOL 100 MG PO TABS: 100.0000 mg | ORAL_TABLET | Freq: Every day | ORAL | Status: DC

## 2013-07-04 MED ORDER — DIGOXIN 125 MCG PO TABS: 0.1250 mg | ORAL_TABLET | ORAL | Status: DC

## 2013-07-04 NOTE — Patient Instructions (Signed)
Start Digoxin 0.125 mg every other day   Labs today  Your physician recommends that you schedule a follow-up appointment in: 1 month

## 2013-07-05 NOTE — Progress Notes (Signed)
Patient ID: ASANI MCBURNEY, male   DOB: 21-Dec-1950, 63 y.o.   MRN: [id number] Referring Physician: Einar Gip PCP:   HPI: Mr. Dyment is a 63 year old male with PMH of Systolic CHF (EF 62-83% in July 14 and 1/15), DM2, HTN, COPD (quit 6/14), paroxysmal atrial flutter on amiodarone, CKD Stage 3, remote h/o poylsubstance abuse referred by Dr. Einar Gip for help with management of his HF. He has no history of heavy ETOH use or cocaine abuse.   Was admitted in January 2015 with sepsis and multiorgan failure. Went to [facility]. Was readmitted in early February for HF. He says that he has not had a cath or a stress test. He was set up for CPX after last appointment but became lightheaded after doing PFTs and was found to have very low BP so did not complete CPX. He was taken off of most of his cardiac meds earlier this year due to symptomatic hypotension.   Follow up: Currently doing reasonably well.  He had been having gout pain in his right knee, but this has improved on prednisone.  He never got his allopurinol prescription.  He is now living in an apartment on his own. He can walk around his apartment without dyspnea.  No chest pain or lightheadedness recently.  He sleeps in a recliner but has been doing this for a long time.  Weight is up 3 lbs.    ECG (4/15): NSR, RBBB, LAFB    Labs (2/15): K 5.2 Cr 1.33  Labs (4/15): K 5.9, creatinine 2.79, digoxin 1.4 Labs (4/15) K 4.4 => 4, creatinine 2.51 =>1.99, uric acid 13.2  ROS: All systems negative except as listed in HPI, PMH and Problem List.  Past Medical History  Diagnosis Date  . Diabetes mellitus without complication   . CHF (congestive heart failure)   . Hypertension   . Cardiomyopathy, dilated   . Small bowel obstruction 10/2012  . COPD (chronic obstructive pulmonary disease)   . Herpes     BUTT AND BACK- 9/23 HEALED  . Atrial flutter by electrocardiogram 11/2012  . GERD (gastroesophageal reflux disease)   . Gout     Current Outpatient  Prescriptions  Medication Sig Dispense Refill  . allopurinol (ZYLOPRIM) 100 MG tablet Take 1 tablet (100 mg total) by mouth daily.  30 tablet  6  . amiodarone (PACERONE) 200 MG tablet Take 1 tablet (200 mg total) by mouth daily.  90 tablet  3  . aspirin EC 81 MG tablet Take 81 mg by mouth daily.      . carvedilol (COREG) 3.125 MG tablet Take 1 tablet (3.125 mg total) by mouth 2 (two) times daily.  180 tablet  3  . torsemide (DEMADEX) 20 MG tablet Take 1 tablet (20 mg total) by mouth 2 (two) times a week. Every Monday and Friday, Hold for wt less than 193 lb  180 tablet  3  . digoxin (LANOXIN) 0.125 MG tablet Take 1 tablet (0.125 mg total) by mouth every other day.  15 tablet  3   No current facility-administered medications for this encounter.   Facility-Administered Medications Ordered in Other Encounters  Medication Dose Route Frequency Provider Last Rate Last Dose  . propofol (DIPRIVAN) 10 mg/mL bolus/IV push    Anesthesia Intra-op Carter Kitten, CRNA   40 mg at 09/12/12 0000  . succinylcholine (ANECTINE) injection    Anesthesia Intra-op Carter Kitten, CRNA   100 mg at 09/12/12 0000      Filed  Vitals:   07/04/13 1339  BP: 84/58  Pulse: 95  Weight: 191 lb (86.637 kg)  SpO2: 95%    PHYSICAL EXAM: General: Chronically ill appearing; No respiratory difficulty; niece present HEENT: normal  Neck: supple. evidence of previous tracheostomy. No JVD. Carotids 2+ bilat; no bruits. No lymphadenopathy or thryomegaly appreciated.  Cor: PMI nondisplaced. Regular rate & rhythm. No rubs, gallops or murmurs.  Lungs: clear  Abdomen: soft, nontender, nondistended. No hepatosplenomegaly. No bruits or masses. Good bowel sounds.  Extremities: no cyanosis, clubbing, rash, edema: LLE swollen and L wrist warm to touch and erythematous   Neuro: alert & oriented x 3, cranial nerves grossly intact. moves all 4 extremities w/o difficulty. Affect pleasant.   ASSESSMENT & PLAN: 1. Chronic systolic HF: EF  22-97% with diffuse hypokinesis. Etiology uncertain: No heavy ETOH or cocaine, no definite family history of cardiomyopathy.  He has not had an ischemic workup. NYHA class II-III symptoms.  BP continues to run low (84/58 today), He is not lightheaded. - He is currently on coreg 3.125 mg BID which he is tolerating, but is not on any other HF medications due to hypotension and CKD. - Volume status stable will continue torsemide 20 mg on Mondays and Fridays.  - Repeat BMET today.  - I am going to restart digoxin at lower dose, 0.125 mg qod.   - Return to office in 1 month with BMET and digoxin level.  - At 1 month followup, will decide on CPX timing.  He will also need a repeat echo once we are on a more stable medical regimen for consideration of ICD.   - He also needs a L/RHC to assess hemodynamics and coronaries, however renal function has been an issue. Once creatinine reaches a stable nadir will plan for cath.  - Reinforced the need and importance of daily weights, a low sodium diet, and fluid restriction (less than 2 L a day). Instructed to call the HF clinic if weight increases more than 3 lbs overnight or 5 lbs in a week.  2. CKD, stage III: Appears Cr baseline is 1.5-1.7. Creatinine has been trending down. As above, will repeat BMET today. Not on ACE-I or spironolactone.  3. Probable COPD: Patient has smoking history with previous prolonged hospitalization for respiratory failure (2011) with tracheostomy. No formal diagnosis of COPD. Should get PFTs eventually. 4. Atrial flutter: Paroxysmal. Appears to be in NSR today. Continue amio at 200 daily. He is not anticoagulated.  He will need LFTs/TSH given amiodarone use.  Will need to get yearly eye exam.  5. Gout: Flare has calmed down.  He has followup with rheumatology.  I will resend a prescription for allopurinol 100 mg daily.   Larey Dresser 07/05/2013

## 2013-07-06 ENCOUNTER — Telehealth: Payer: Self-pay | Admitting: Licensed Clinical Social Worker

## 2013-07-06 NOTE — Telephone Encounter (Signed)
CSW referred to assist with obtaining a PCP. CSW attempted to contact patient with no succes but was able to reach his niece who assists with medical appointments. CSW informed niece that patient has an assigned PCP listed on his medicaid card. Wanda (neice) verbalizes understanding and will contact PCP to make appointment for patient. No further needs at this time. CSW available if further needs arise. Raquel Sarna, [place]

## 2013-07-12 ENCOUNTER — Observation Stay (HOSPITAL_COMMUNITY)
Admission: EM | Admit: 2013-07-12 | Discharge: 2013-07-14 | Disposition: A | Discharge status: Home / Self Care | Payer: Medicare Other | Attending: Internal Medicine | Admitting: Internal Medicine

## 2013-07-12 ENCOUNTER — Encounter (HOSPITAL_COMMUNITY): Payer: Self-pay | Admitting: Emergency Medicine

## 2013-07-12 DIAGNOSIS — I5022 Chronic systolic (congestive) heart failure: Secondary | ICD-10-CM

## 2013-07-12 DIAGNOSIS — T460X1A Poisoning by cardiac-stimulant glycosides and drugs of similar action, accidental (unintentional), initial encounter: Secondary | ICD-10-CM

## 2013-07-12 DIAGNOSIS — I428 Other cardiomyopathies: Secondary | ICD-10-CM | POA: Insufficient documentation

## 2013-07-12 DIAGNOSIS — J9600 Acute respiratory failure, unspecified whether with hypoxia or hypercapnia: Secondary | ICD-10-CM

## 2013-07-12 DIAGNOSIS — E119 Type 2 diabetes mellitus without complications: Secondary | ICD-10-CM

## 2013-07-12 DIAGNOSIS — I5043 Acute on chronic combined systolic (congestive) and diastolic (congestive) heart failure: Secondary | ICD-10-CM

## 2013-07-12 DIAGNOSIS — N179 Acute kidney failure, unspecified: Secondary | ICD-10-CM

## 2013-07-12 DIAGNOSIS — Z87891 Personal history of nicotine dependence: Secondary | ICD-10-CM | POA: Insufficient documentation

## 2013-07-12 DIAGNOSIS — E43 Unspecified severe protein-calorie malnutrition: Secondary | ICD-10-CM

## 2013-07-12 DIAGNOSIS — R5381 Other malaise: Secondary | ICD-10-CM

## 2013-07-12 DIAGNOSIS — R570 Cardiogenic shock: Secondary | ICD-10-CM

## 2013-07-12 DIAGNOSIS — D509 Iron deficiency anemia, unspecified: Secondary | ICD-10-CM

## 2013-07-12 DIAGNOSIS — N390 Urinary tract infection, site not specified: Secondary | ICD-10-CM

## 2013-07-12 DIAGNOSIS — R112 Nausea with vomiting, unspecified: Secondary | ICD-10-CM

## 2013-07-12 DIAGNOSIS — I959 Hypotension, unspecified: Secondary | ICD-10-CM

## 2013-07-12 DIAGNOSIS — I129 Hypertensive chronic kidney disease with stage 1 through stage 4 chronic kidney disease, or unspecified chronic kidney disease: Secondary | ICD-10-CM | POA: Insufficient documentation

## 2013-07-12 DIAGNOSIS — B029 Zoster without complications: Secondary | ICD-10-CM

## 2013-07-12 DIAGNOSIS — E860 Dehydration: Secondary | ICD-10-CM

## 2013-07-12 DIAGNOSIS — I509 Heart failure, unspecified: Secondary | ICD-10-CM | POA: Insufficient documentation

## 2013-07-12 DIAGNOSIS — D696 Thrombocytopenia, unspecified: Secondary | ICD-10-CM

## 2013-07-12 DIAGNOSIS — A419 Sepsis, unspecified organism: Secondary | ICD-10-CM

## 2013-07-12 DIAGNOSIS — I5041 Acute combined systolic (congestive) and diastolic (congestive) heart failure: Secondary | ICD-10-CM

## 2013-07-12 DIAGNOSIS — Z79899 Other long term (current) drug therapy: Secondary | ICD-10-CM | POA: Insufficient documentation

## 2013-07-12 DIAGNOSIS — G9340 Encephalopathy, unspecified: Secondary | ICD-10-CM

## 2013-07-12 DIAGNOSIS — N183 Chronic kidney disease, stage 3 unspecified: Secondary | ICD-10-CM

## 2013-07-12 DIAGNOSIS — J4489 Other specified chronic obstructive pulmonary disease: Secondary | ICD-10-CM | POA: Insufficient documentation

## 2013-07-12 DIAGNOSIS — M109 Gout, unspecified: Principal | ICD-10-CM

## 2013-07-12 DIAGNOSIS — J449 Chronic obstructive pulmonary disease, unspecified: Secondary | ICD-10-CM

## 2013-07-12 DIAGNOSIS — Z8679 Personal history of other diseases of the circulatory system: Secondary | ICD-10-CM

## 2013-07-12 DIAGNOSIS — I4892 Unspecified atrial flutter: Secondary | ICD-10-CM

## 2013-07-12 DIAGNOSIS — E876 Hypokalemia: Secondary | ICD-10-CM

## 2013-07-12 DIAGNOSIS — I119 Hypertensive heart disease without heart failure: Secondary | ICD-10-CM

## 2013-07-12 DIAGNOSIS — R6520 Severe sepsis without septic shock: Secondary | ICD-10-CM

## 2013-07-12 DIAGNOSIS — I1310 Hypertensive heart and chronic kidney disease without heart failure, with stage 1 through stage 4 chronic kidney disease, or unspecified chronic kidney disease: Secondary | ICD-10-CM

## 2013-07-12 DIAGNOSIS — R197 Diarrhea, unspecified: Secondary | ICD-10-CM

## 2013-07-12 DIAGNOSIS — Z7982 Long term (current) use of aspirin: Secondary | ICD-10-CM | POA: Insufficient documentation

## 2013-07-12 DIAGNOSIS — K219 Gastro-esophageal reflux disease without esophagitis: Secondary | ICD-10-CM

## 2013-07-12 DIAGNOSIS — E1129 Type 2 diabetes mellitus with other diabetic kidney complication: Secondary | ICD-10-CM

## 2013-07-12 DIAGNOSIS — J189 Pneumonia, unspecified organism: Secondary | ICD-10-CM

## 2013-07-12 DIAGNOSIS — R748 Abnormal levels of other serum enzymes: Secondary | ICD-10-CM

## 2013-07-12 LAB — CBC
HCT: 28.8 % — ABNORMAL LOW (ref 39.0–52.0)
HCT: 28.3 % — ABNORMAL LOW (ref 39.0–52.0)
HEMOGLOBIN: 9.2 g/dL — AB (ref 13.0–17.0)
HEMOGLOBIN: 8.9 g/dL — AB (ref 13.0–17.0)
MCH: 24.1 pg — ABNORMAL LOW (ref 26.0–34.0)
MCH: 23.6 pg — AB (ref 26.0–34.0)
MCHC: 31.9 g/dL (ref 30.0–36.0)
MCHC: 31.4 g/dL (ref 30.0–36.0)
MCV: 75.4 fL — ABNORMAL LOW (ref 78.0–100.0)
MCV: 75.1 fL — ABNORMAL LOW (ref 78.0–100.0)
PLATELETS: 199 10*3/uL (ref 150–400)
Platelets: 220 10*3/uL (ref 150–400)
RBC: 3.82 MIL/uL — AB (ref 4.22–5.81)
RBC: 3.77 MIL/uL — ABNORMAL LOW (ref 4.22–5.81)
RDW: 17.8 % — ABNORMAL HIGH (ref 11.5–15.5)
RDW: 17.5 % — ABNORMAL HIGH (ref 11.5–15.5)
WBC: 8.9 10*3/uL (ref 4.0–10.5)
WBC: 7.8 10*3/uL (ref 4.0–10.5)

## 2013-07-12 LAB — I-STAT CHEM 8, ED
BUN: 20 mg/dL (ref 6–23)
Calcium, Ion: 1.12 mmol/L — ABNORMAL LOW (ref 1.13–1.30)
Chloride: 102 mEq/L (ref 96–112)
Creatinine, Ser: 1.6 mg/dL — ABNORMAL HIGH (ref 0.50–1.35)
Glucose, Bld: 93 mg/dL (ref 70–99)
HEMATOCRIT: 31 % — AB (ref 39.0–52.0)
Hemoglobin: 10.5 g/dL — ABNORMAL LOW (ref 13.0–17.0)
POTASSIUM: 3.4 meq/L — AB (ref 3.7–5.3)
Sodium: 142 mEq/L (ref 137–147)
TCO2: 27 mmol/L (ref 0–100)

## 2013-07-12 LAB — GLUCOSE, CAPILLARY: GLUCOSE-CAPILLARY: 235 mg/dL — AB (ref 70–99)

## 2013-07-12 LAB — CREATININE, SERUM
CREATININE: 1.39 mg/dL — AB (ref 0.50–1.35)
GFR calc Af Amer: 61 mL/min — ABNORMAL LOW (ref >90)
GFR calc non Af Amer: 53 mL/min — ABNORMAL LOW (ref >90)

## 2013-07-12 LAB — URIC ACID: Uric Acid, Serum: 9 mg/dL — ABNORMAL HIGH (ref 4.0–7.8)

## 2013-07-12 MED ORDER — ISOSORBIDE DINITRATE 10 MG PO TABS
10.0000 mg | ORAL_TABLET | Freq: Three times a day (TID) | ORAL | Status: DC
  Administered 2013-07-12 – 2013-07-14 (×5): 10 mg via ORAL
  Filled 2013-07-12 (×7): qty 1

## 2013-07-12 MED ORDER — ONDANSETRON HCL 4 MG/2ML IJ SOLN: 4.0000 mg | Freq: Four times a day (QID) | INTRAMUSCULAR | Status: DC | PRN

## 2013-07-12 MED ORDER — LISINOPRIL 2.5 MG PO TABS
2.5000 mg | ORAL_TABLET | Freq: Every day | ORAL | Status: DC
  Administered 2013-07-13 – 2013-07-14 (×2): 2.5 mg via ORAL
  Filled 2013-07-12 (×2): qty 1

## 2013-07-12 MED ORDER — ALUM & MAG HYDROXIDE-SIMETH 200-200-20 MG/5ML PO SUSP: 30.0000 mL | Freq: Four times a day (QID) | ORAL | Status: DC | PRN

## 2013-07-12 MED ORDER — MORPHINE SULFATE 2 MG/ML IJ SOLN: 1.0000 mg | INTRAMUSCULAR | Status: DC | PRN

## 2013-07-12 MED ORDER — PREDNISONE 20 MG PO TABS
60.0000 mg | ORAL_TABLET | Freq: Once | ORAL | Status: AC
  Administered 2013-07-12: 60 mg via ORAL
  Filled 2013-07-12: qty 3

## 2013-07-12 MED ORDER — DIGOXIN 125 MCG PO TABS
0.1250 mg | ORAL_TABLET | ORAL | Status: DC
  Administered 2013-07-12 – 2013-07-14 (×2): 0.125 mg via ORAL
  Filled 2013-07-12 (×2): qty 1

## 2013-07-12 MED ORDER — AMIODARONE HCL 200 MG PO TABS
200.0000 mg | ORAL_TABLET | Freq: Every day | ORAL | Status: DC
  Administered 2013-07-13 – 2013-07-14 (×2): 200 mg via ORAL
  Filled 2013-07-12 (×2): qty 1

## 2013-07-12 MED ORDER — ASPIRIN EC 81 MG PO TBEC
81.0000 mg | DELAYED_RELEASE_TABLET | Freq: Every day | ORAL | Status: DC
  Administered 2013-07-13 – 2013-07-14 (×2): 81 mg via ORAL
  Filled 2013-07-12 (×2): qty 1

## 2013-07-12 MED ORDER — ACETAMINOPHEN 325 MG PO TABS: 650.0000 mg | ORAL_TABLET | Freq: Four times a day (QID) | ORAL | Status: DC | PRN

## 2013-07-12 MED ORDER — SODIUM CHLORIDE 0.9 % IV SOLN: 250.0000 mL | INTRAVENOUS | Status: DC | PRN

## 2013-07-12 MED ORDER — SPIRONOLACTONE 25 MG PO TABS
25.0000 mg | ORAL_TABLET | Freq: Every day | ORAL | Status: DC
  Administered 2013-07-13 – 2013-07-14 (×2): 25 mg via ORAL
  Filled 2013-07-12 (×2): qty 1

## 2013-07-12 MED ORDER — TORSEMIDE 20 MG PO TABS
20.0000 mg | ORAL_TABLET | ORAL | Status: DC
  Administered 2013-07-14: 20 mg via ORAL
  Filled 2013-07-12: qty 1

## 2013-07-12 MED ORDER — ALBUTEROL SULFATE (2.5 MG/3ML) 0.083% IN NEBU: 2.5000 mg | INHALATION_SOLUTION | RESPIRATORY_TRACT | Status: DC | PRN

## 2013-07-12 MED ORDER — ALLOPURINOL 100 MG PO TABS
100.0000 mg | ORAL_TABLET | Freq: Every day | ORAL | Status: DC
  Administered 2013-07-13 – 2013-07-14 (×2): 100 mg via ORAL
  Filled 2013-07-12 (×2): qty 1

## 2013-07-12 MED ORDER — SODIUM CHLORIDE 0.9 % IJ SOLN: 3.0000 mL | INTRAMUSCULAR | Status: DC | PRN

## 2013-07-12 MED ORDER — HYDROCODONE-ACETAMINOPHEN 5-325 MG PO TABS: 1.0000 | ORAL_TABLET | ORAL | Status: DC | PRN

## 2013-07-12 MED ORDER — SODIUM CHLORIDE 0.9 % IJ SOLN
3.0000 mL | Freq: Two times a day (BID) | INTRAMUSCULAR | Status: DC
  Administered 2013-07-13 – 2013-07-14 (×3): 3 mL via INTRAVENOUS

## 2013-07-12 MED ORDER — SODIUM CHLORIDE 0.9 % IJ SOLN
3.0000 mL | Freq: Two times a day (BID) | INTRAMUSCULAR | Status: DC
  Administered 2013-07-12: 3 mL via INTRAVENOUS

## 2013-07-12 MED ORDER — GUAIFENESIN-DM 100-10 MG/5ML PO SYRP: 5.0000 mL | ORAL_SOLUTION | ORAL | Status: DC | PRN

## 2013-07-12 MED ORDER — PREDNISONE 20 MG PO TABS
40.0000 mg | ORAL_TABLET | Freq: Every day | ORAL | Status: DC
  Administered 2013-07-13 – 2013-07-14 (×2): 40 mg via ORAL
  Filled 2013-07-12 (×3): qty 2

## 2013-07-12 MED ORDER — HEPARIN SODIUM (PORCINE) 5000 UNIT/ML IJ SOLN
5000.0000 [IU] | Freq: Three times a day (TID) | INTRAMUSCULAR | Status: DC
  Administered 2013-07-12 – 2013-07-14 (×5): 5000 [IU] via SUBCUTANEOUS
  Filled 2013-07-12 (×8): qty 1

## 2013-07-12 MED ORDER — ONDANSETRON HCL 4 MG PO TABS: 4.0000 mg | ORAL_TABLET | Freq: Four times a day (QID) | ORAL | Status: DC | PRN

## 2013-07-12 MED ORDER — ACETAMINOPHEN 650 MG RE SUPP: 650.0000 mg | Freq: Four times a day (QID) | RECTAL | Status: DC | PRN

## 2013-07-12 MED ORDER — KETOROLAC TROMETHAMINE 60 MG/2ML IM SOLN: 60.0000 mg | Freq: Once | INTRAMUSCULAR | Status: DC

## 2013-07-12 MED ORDER — CARVEDILOL 3.125 MG PO TABS
3.1250 mg | ORAL_TABLET | Freq: Two times a day (BID) | ORAL | Status: DC
  Administered 2013-07-12 – 2013-07-14 (×4): 3.125 mg via ORAL
  Filled 2013-07-12 (×6): qty 1

## 2013-07-12 NOTE — ED Provider Notes (Signed)
Plains of right hand pain and right knee pain for the past 3 days. No fever. Pain is typical of gout he's had in the past. He's been unable to walk and is unable to get to the bathroom the past 2 days due to pain in his knee. Walks with walker.   Orlie Dakin, MD 07/12/13 [phone]

## 2013-07-12 NOTE — H&P (Addendum)
PATIENT DETAILS Name: Caleb Bauer Age: 63 y.o. Sex: male Date of Birth: 03-Nov-1950 Admit Date: 07/12/2013 NFA:[id number],MVHQIONGE R, MD   CHIEF COMPLAINT:  Right wrist and right knee swelling for the past 2 days.  HPI: Caleb Bauer is a 63 y.o. male with a Past Medical History of chronic systolic heart failure with last EF around 20-25%, no history of gout who presents today with the above noted complaint. Per patient he was in his usual state of health, starting this past Sunday he started developing some mild pain and swelling on his right dorsal wrist and his right knee area. These gradually worsened, yesterday he had significant swelling and mild erythema around his right wrist and right knee. He denies any fever. Because of significant pain he was unable to ambulate, as a result he presented to the emergency room for further evaluation. He claims that this is how his gout flare presents, and claims that this is exactly how it feels like. In the ED he was given prednisone, however he was to unable to ambulate, as a result I was asked to admit this patient for further evaluation and treatment Patient denies any fever, headache, chest pain, no worsening of usual shortness of breath, nausea, vomiting or diarrhea.   ALLERGIES:   Allergies  Allergen Reactions  . Other Nausea And Vomiting    Patient stated drug is "kerein"    PAST MEDICAL HISTORY: Past Medical History  Diagnosis Date  . Diabetes mellitus without complication   . CHF (congestive heart failure)   . Hypertension   . Cardiomyopathy, dilated   . Small bowel obstruction 10/2012  . COPD (chronic obstructive pulmonary disease)   . Herpes     BUTT AND BACK- 9/23 HEALED  . Atrial flutter by electrocardiogram 11/2012  . GERD (gastroesophageal reflux disease)   . Gout     PAST SURGICAL HISTORY: Past Surgical History  Procedure Laterality Date  . Laparoscopic incisional / umbilical / ventral hernia repair       MEDICATIONS AT HOME: Prior to Admission medications   Medication Sig Start Date End Date Taking? Authorizing Provider  allopurinol (ZYLOPRIM) 100 MG tablet Take 1 tablet (100 mg total) by mouth daily. 07/04/13  Yes Larey Dresser, MD  amiodarone (PACERONE) 200 MG tablet Take 1 tablet (200 mg total) by mouth daily. 06/06/13  Yes Jolaine Artist, MD  aspirin EC 81 MG tablet Take 81 mg by mouth daily.   Yes Historical Provider, MD  carvedilol (COREG) 3.125 MG tablet Take 1 tablet (3.125 mg total) by mouth 2 (two) times daily. 06/06/13  Yes Jolaine Artist, MD  isosorbide dinitrate (ISORDIL) 10 MG tablet Take 10 mg by mouth 3 (three) times daily.   Yes Historical Provider, MD  lisinopril (PRINIVIL,ZESTRIL) 2.5 MG tablet Take 2.5 mg by mouth daily.   Yes Historical Provider, MD  spironolactone (ALDACTONE) 25 MG tablet Take 25 mg by mouth daily.   Yes Historical Provider, MD  torsemide (DEMADEX) 20 MG tablet Take 1 tablet (20 mg total) by mouth 2 (two) times a week. Every Monday and Friday, Hold for wt less than 193 lb 06/13/13  Yes Jolaine Artist, MD  digoxin (LANOXIN) 0.125 MG tablet Take 1 tablet (0.125 mg total) by mouth every other day. 07/04/13   Larey Dresser, MD    FAMILY HISTORY: Family History  Problem Relation Age of Onset  . Cancer Neg Hx     SOCIAL  HISTORY:  reports that he quit smoking about 9 months ago. His smoking use included Cigarettes. He has a .125 pack-year smoking history. He has never used smokeless tobacco. He reports that he does not drink alcohol or use illicit drugs.  REVIEW OF SYSTEMS:  Constitutional:   No  weight loss, night sweats,  Fevers, chills, fatigue.  HEENT:    No headaches, Difficulty swallowing,Tooth/dental problems,Sore throat,  No sneezing, itching, ear ache, nasal congestion, post nasal drip,   Cardio-vascular: No chest pain,  Orthopnea, PND, swelling in lower extremities, anasarca,         dizziness, palpitations  GI:  No heartburn,  indigestion, abdominal pain, nausea, vomiting, diarrhea, change in       bowel habits, loss of appetite  Resp: No shortness of breath with exertion or at rest.  No excess mucus, no productive cough, No non-productive cough,  No coughing up of blood.No change in color of mucus.No wheezing.No chest wall deformity  Skin:  no rash or lesions.  GU:  no dysuria, change in color of urine, no urgency or frequency.  No flank pain.  Musculoskeletal: No back pain.  Psych: No change in mood or affect. No depression or anxiety.  No memory loss.   PHYSICAL EXAM: Blood pressure 121/61, pulse 75, temperature 98.6 F (37 C), temperature source Oral, resp. rate 16, height 6' (1.829 m), weight 86.637 kg (191 lb), SpO2 100.00%.  General appearance :Awake, alert, not in any distress. Speech Clear. Not toxic Looking HEENT: Atraumatic and Normocephalic, pupils equally reactive to light and accomodation Neck: supple, no JVD. No cervical lymphadenopathy.  Chest:Good air entry bilaterally, no added sounds  CVS: S1 S2 regular, no murmurs.  Abdomen: Bowel sounds present, Non tender and not distended with no gaurding, rigidity or rebound. Extremities: B/L Lower Ext shows no edema, both legs are warm to touch. Right wrist-dorsal aspect with swelling and mild erythema, significant tenderness and passive range of motion, good capillary refill. Right knee with mild erythema and swelling and significantly tender. Neurology: Awake alert, and oriented X 3, CN II-XII intact, Non focal Skin:No Rash Wounds:N/A  LABS ON ADMISSION:   Recent Labs  07/12/13 1535  NA 142  K 3.4*  CL 102  GLUCOSE 93  BUN 20  CREATININE 1.60*   No results found for this basename: AST, ALT, ALKPHOS, BILITOT, PROT, ALBUMIN,  in the last 72 hours No results found for this basename: LIPASE, AMYLASE,  in the last 72 hours  Recent Labs  07/12/13 1529 07/12/13 1535  WBC 8.9  --   HGB 9.2* 10.5*  HCT 28.8* 31.0*  MCV 75.4*  --     PLT 220  --    No results found for this basename: CKTOTAL, CKMB, CKMBINDEX, TROPONINI,  in the last 72 hours No results found for this basename: DDIMER,  in the last 72 hours No components found with this basename: POCBNP,    RADIOLOGIC STUDIES ON ADMISSION: No results found.  ASSESSMENT AND PLAN: Present on Admission:  . Acute gouty arthritis - Involving his right wrist and right knee. Patient claims that this episode is very similar to his usual gout flares. No fever, or leukocytosis to suggest any infectious arthritis. Unable to ambulate given significant pain his acne, and lives alone, will admit as 24 observation, will check uric acid level and start oral prednisone given stage III chronic kidney disease. If patient is significantly improved by 5/13 he can be discharged home.However if he were to have significant  worsening with fever or persistent leukocytosis, he may need arthrocentesis and synovitis fluid analysis, since patient claims that this is very similar to his arthritis flare, we will empirically start prednisone and follow clinical course.   . Chronic systolic heart failure - Community compensated, continue with Coreg, lisinopril, Demadex and Aldactone he  - Monitor in telemetry   . COPD (chronic obstructive pulmonary disease) - Stable, as needed nebulized bronchodilators.  . CKD Stage 3 -at usual baseline, monitor lytes periodically  Further plan will depend as patient's clinical course evolves and further radiologic and laboratory data become available. Patient will be monitored closely.  Above noted plan was discussed with patient,he was in agreement.   DVT Prophylaxis: Prophylactic Heparin  Code Status: Full Code  Total time spent for admission equals 45 minutes.  Jonetta Osgood [facility] Pager [phone]  If 7PM-7AM, please contact night-coverage [email or web address] Password [facility] 07/12/2013, 5:33 PM  **Disclaimer: This note may have been  dictated with voice recognition software. Similar sounding words can inadvertently be transcribed and this note may contain transcription errors which may not have been corrected upon publication of note.**

## 2013-07-12 NOTE — ED Provider Notes (Signed)
Medical screening examination/treatment/procedure(s) were conducted as a shared visit with non-physician practitioner(s) and myself.  I personally evaluated the patient during the encounter.   EKG Interpretation None       Orlie Dakin, MD 07/12/13 2055

## 2013-07-12 NOTE — Progress Notes (Signed)
ED CM consulted by A. Harris PA-C to meet with patient concerning care needs. Pt presented to [facility] ED with gout no past history,  PMH of chronic systolic heart failure with last EF around 20-25%. Pt has had 2 ED visits and 3 hospitalizations in the past 6 months. Pt covered by Medicare A and B. Met with patient at bedside, confirmed information. Pt reports living in senior housing alone and prior to now he has been able to self care. This past Sunday he started developing some mild pain and swelling on his right dorsal wrist and his right knee area. These gradually worsened, yesterday he had significant swelling and mild erythema around his right wrist and right knee.  Because of significant pain he is unable to ambulate. Pt states, his niece comes by every couple of days to help him. Pt states, that he was unable to get to the bathroom he has been using a pot. Discussed DME patient states, he has a rolling walker at home, but does not have a potty chair. Choice offered DME list given, selected [facility]. Referral placed for DME. Pt verbalized understanding and is agreeable. Consult placed for internal medicine to be placed in possible observation status. CM will continue to follow for discharge plan    

## 2013-07-12 NOTE — ED Provider Notes (Signed)
CSN: [id number]     Arrival date & time 07/12/13  1136 History   First MD Initiated Contact with Patient 07/12/13 1402     Chief Complaint  Patient presents with  . Gout     (Consider location/radiation/quality/duration/timing/severity/associated sxs/prior Treatment) HPI  Caleb Bauer is a(n) 63 y.o. male who presents to the emergency department with chief complaint of a gout flare. He has a past medical history of diabetes, CHF, hypertension, COPD, and chronic kidney disease. The patient states that his gout flare began yesterday. He was started on allopurinol approximately one week ago. He complains of severe pain and swelling in his right knee and right hand. He has been unable to care for himself at home. Unable to ambulate or complete ADLs. Denies fevers, chills, myalgias, arthralgias. Denies DOE, SOB, chest tightness or pressure, radiation to left arm, jaw or back, or diaphoresis. Denies dysuria, flank pain, suprapubic pain, frequency, urgency, or hematuria. Denies headaches, light headedness, weakness, visual disturbances. Denies abdominal pain, nausea, vomiting, diarrhea or constipation.   Past Medical History  Diagnosis Date  . Diabetes mellitus without complication   . CHF (congestive heart failure)   . Hypertension   . Cardiomyopathy, dilated   . Small bowel obstruction 10/2012  . COPD (chronic obstructive pulmonary disease)   . Herpes     BUTT AND BACK- 9/23 HEALED  . Atrial flutter by electrocardiogram 11/2012  . GERD (gastroesophageal reflux disease)   . Gout    Past Surgical History  Procedure Laterality Date  . Laparoscopic incisional / umbilical / ventral hernia repair     Family History  Problem Relation Age of Onset  . Cancer Neg Hx    History  Substance Use Topics  . Smoking status: Former Smoker -- 0.25 packs/day for .5 years    Types: Cigarettes    Quit date: 09/28/2012  . Smokeless tobacco: Never Used  . Alcohol Use: No    Review of Systems  Ten  systems reviewed and are negative for acute change, except as noted in the HPI.    Allergies  Other  Home Medications   Prior to Admission medications   Medication Sig Start Date End Date Taking? Authorizing Provider  allopurinol (ZYLOPRIM) 100 MG tablet Take 1 tablet (100 mg total) by mouth daily. 07/04/13  Yes Larey Dresser, MD  amiodarone (PACERONE) 200 MG tablet Take 1 tablet (200 mg total) by mouth daily. 06/06/13  Yes Jolaine Artist, MD  aspirin EC 81 MG tablet Take 81 mg by mouth daily.   Yes Historical Provider, MD  carvedilol (COREG) 3.125 MG tablet Take 1 tablet (3.125 mg total) by mouth 2 (two) times daily. 06/06/13  Yes Jolaine Artist, MD  isosorbide dinitrate (ISORDIL) 10 MG tablet Take 10 mg by mouth 3 (three) times daily.   Yes Historical Provider, MD  lisinopril (PRINIVIL,ZESTRIL) 2.5 MG tablet Take 2.5 mg by mouth daily.   Yes Historical Provider, MD  spironolactone (ALDACTONE) 25 MG tablet Take 25 mg by mouth daily.   Yes Historical Provider, MD  torsemide (DEMADEX) 20 MG tablet Take 1 tablet (20 mg total) by mouth 2 (two) times a week. Every Monday and Friday, Hold for wt less than 193 lb 06/13/13  Yes Jolaine Artist, MD  digoxin (LANOXIN) 0.125 MG tablet Take 1 tablet (0.125 mg total) by mouth every other day. 07/04/13   Larey Dresser, MD   BP 112/59  Pulse 72  Temp(Src) 98.6 F (37 C) (Oral)  Resp 16  Ht 6' (1.829 m)  Wt 191 lb (86.637 kg)  BMI 25.90 kg/m2  SpO2 98% Physical Exam Physical Exam  Nursing note and vitals reviewed. Constitutional: He appears Chronically ill. No distress.  HENT:  Head: Normocephalic and atraumatic.  Eyes: Conjunctivae normal are normal. No scleral icterus.  Neck: Normal range of motion. Neck supple.  Cardiovascular: Normal rate, regular rhythm and normal heart sounds.   Pulmonary/Chest: Effort normal and breath sounds normal. No respiratory distress.  Abdominal: Soft. There is no tenderness.  Musculoskeletal: He exhibits  no edema.  Neurological: He is alert.  Skin: Skin is warm and dry. He is not diaphoretic.  Musculoskeletal. R Hand and R knee are grossly swollen, warm, mild erythema. exquisetly ttp.  Psychiatric: His behavior is normal.    ED Course  Procedures (including critical care time) Labs Review Labs Reviewed - No data to display  Imaging Review No results found.   EKG Interpretation None      MDM   Final diagnoses:  Gout flare    3:32 PM Patient with DM and CKD i will obtain a blood sugar before giving solumedrol.  Filed Vitals:   07/12/13 1645 07/12/13 1700 07/12/13 1715 07/12/13 1849  BP: 120/66 131/65 128/64 147/78  Pulse: 91 72 74 72  Temp:    98.1 F (36.7 C)  TempSrc:    Oral  Resp:    18  Height:      Weight:      SpO2: 95% 94% 99% 99%  Patient given IV solumedrol He had blood sugar of 92. Care managmaent consult for home health. Patient was unable to ambulate here in the ED> Dr. Lovena Neighbours will admit for observation. The patient appears reasonably stabilized for admission considering the current resources, flow, and capabilities available in the ED at this time, and I doubt any other [facility] requiring further screening and/or treatment in the ED prior to admission.          Margarita Mail, PA-C 07/12/13 1955

## 2013-07-12 NOTE — ED Notes (Signed)
Patient presents today with a chief complaint of joint pain to right ulnar joint, right patellar joint, with swelling to right hand since Sunday this week. Patient has history of gout and is taking Allopurinol at home. Denies taking any other medications for gout.

## 2013-07-13 DIAGNOSIS — E119 Type 2 diabetes mellitus without complications: Secondary | ICD-10-CM

## 2013-07-13 DIAGNOSIS — E1129 Type 2 diabetes mellitus with other diabetic kidney complication: Secondary | ICD-10-CM

## 2013-07-13 LAB — BASIC METABOLIC PANEL
BUN: 28 mg/dL — AB (ref 6–23)
CALCIUM: 8.4 mg/dL (ref 8.4–10.5)
CO2: 23 mEq/L (ref 19–32)
Chloride: 99 mEq/L (ref 96–112)
Creatinine, Ser: 1.5 mg/dL — ABNORMAL HIGH (ref 0.50–1.35)
GFR calc Af Amer: 56 mL/min — ABNORMAL LOW (ref >90)
GFR, EST NON AFRICAN AMERICAN: 48 mL/min — AB (ref >90)
GLUCOSE: 205 mg/dL — AB (ref 70–99)
Potassium: 3.6 mEq/L — ABNORMAL LOW (ref 3.7–5.3)
Sodium: 137 mEq/L (ref 137–147)

## 2013-07-13 LAB — GLUCOSE, CAPILLARY
GLUCOSE-CAPILLARY: 144 mg/dL — AB (ref 70–99)
Glucose-Capillary: 125 mg/dL — ABNORMAL HIGH (ref 70–99)
Glucose-Capillary: 119 mg/dL — ABNORMAL HIGH (ref 70–99)

## 2013-07-13 LAB — CBC
HEMATOCRIT: 26.2 % — AB (ref 39.0–52.0)
HEMOGLOBIN: 8.2 g/dL — AB (ref 13.0–17.0)
MCH: 23.6 pg — AB (ref 26.0–34.0)
MCHC: 31.3 g/dL (ref 30.0–36.0)
MCV: 75.5 fL — ABNORMAL LOW (ref 78.0–100.0)
Platelets: 215 10*3/uL (ref 150–400)
RBC: 3.47 MIL/uL — ABNORMAL LOW (ref 4.22–5.81)
RDW: 17.8 % — ABNORMAL HIGH (ref 11.5–15.5)
WBC: 8 10*3/uL (ref 4.0–10.5)

## 2013-07-13 NOTE — Progress Notes (Signed)
UR completed 

## 2013-07-13 NOTE — Evaluation (Addendum)
Physical Therapy Evaluation Patient Details Name: Caleb Bauer MRN: [id number] DOB: 04/14/1950 Today's Date: 07/13/2013   History of Present Illness  Patient admitted with right knee and wrist/elbow pain with gout attack.  H/o systolic heart failure with EF 20-25%.  Clinical Impression  Patient presents with decreased mobility due to deficits listed in PT problem list.  Main issue is pain from gout attack.  Feel he would benefit from one more day to improve tolerance to mobility and due to the fact doesn't have a ride and lives alone (though could get cab voucher.)  Did assist with donning briefs for ambulation per patient request due to limited AROM right knee.  Encouraged ambulation again later today with nursing staff.  Pain still rated at 9/10.  Recommend HHPT at d/c.      Follow Up Recommendations Home health PT    Equipment Recommendations  None recommended by PT    Recommendations for Other Services       Precautions / Restrictions Precautions Precautions: Fall Restrictions Weight Bearing Restrictions: No      Mobility  Bed Mobility Overal bed mobility: Modified Independent                Transfers Overall transfer level: Modified independent Equipment used: Rolling walker (2 wheeled)                Ambulation/Gait Ambulation/Gait assistance: Supervision Ambulation Distance (Feet): 40 Feet Assistive device: Rolling walker (2 wheeled) Gait Pattern/deviations: Step-to pattern;Antalgic     General Gait Details: difficulty using walker with right wrist and hand pain/swelling; but able to use well enough for short distance ambulation  Stairs            Wheelchair Mobility    Modified Rankin (Stroke Patients Only)       Balance Overall balance assessment: Needs assistance         Standing balance support: Bilateral upper extremity supported;No upper extremity supported Standing balance-Leahy Scale: Fair Standing balance comment: UE  assist for ambulation due to pain                             Pertinent Vitals/Pain 9/10 right knee, wrist, elbow    Home Living Family/patient expects to be discharged to:: Private residence Living Arrangements: Alone Available Help at Discharge: Family;Available PRN/intermittently Type of Home: Apartment Home Access: Level entry     Home Layout: One level Home Equipment: Walker - 2 wheels;Cane - single point;Bedside commode (just got BSC) Additional Comments: Neice helps with groceries, takes bus for transportation    Prior Function Level of Independence: Independent with assistive device(s)               Hand Dominance   Dominant Hand: Right    Extremity/Trunk Assessment   Upper Extremity Assessment: RUE deficits/detail RUE Deficits / Details: pain at wrist and elbow with edema.  able to grasp can of soda, but unable to use cell phone per his report         Lower Extremity Assessment: RLE deficits/detail RLE Deficits / Details: AROM limited to about 60 degrees knee flexion supine (tolerates more with function)  strength NT due to pain, ankle AROM WFL       Communication   Communication: No difficulties  Cognition Arousal/Alertness: Awake/alert Behavior During Therapy: WFL for tasks assessed/performed Overall Cognitive Status: Within Functional Limits for tasks assessed  General Comments      Exercises        Assessment/Plan    PT Assessment Patient needs continued PT services  PT Diagnosis Difficulty walking;Acute pain   PT Problem List Decreased range of motion;Decreased activity tolerance;Pain;Decreased mobility;Decreased knowledge of use of DME  PT Treatment Interventions DME instruction;Gait training;Balance training;Functional mobility training;Patient/family education;Therapeutic activities;Therapeutic exercise   PT Goals (Current goals can be found in the Care Plan section) Acute Rehab PT  Goals Patient Stated Goal: To go home tomorrow PT Goal Formulation: With patient Time For Goal Achievement: 07/20/13 Potential to Achieve Goals: Good    Frequency Min 3X/week   Barriers to discharge        Co-evaluation               End of Session Equipment Utilized During Treatment: Gait belt Activity Tolerance: Patient limited by pain Patient left: in bed;with call bell/phone within reach      Functional Assessment Tool Used: Clinical Judgement Functional Limitation: Mobility: Walking and moving around Mobility: Walking and Moving Around Current Status (H3716): At least 1 percent but less than 20 percent impaired, limited or restricted Mobility: Walking and Moving Around Goal Status [phone]): At least 1 percent but less than 20 percent impaired, limited or restricted    Time: 1102-1122 PT Time Calculation (min): 20 min   Charges:   PT Evaluation $Initial PT Evaluation Tier I: 1 Procedure PT Treatments $Gait Training: 8-22 mins   PT G Codes:   Functional Assessment Tool Used: Clinical Judgement Functional Limitation: Mobility: Walking and moving around    [employer] 07/13/2013, 11:24 AM Magda Kiel, PT [phone] 07/13/2013

## 2013-07-13 NOTE — Care Management Note (Signed)
    Page 1 of 1   07/13/2013     1:56:34 PM CARE MANAGEMENT NOTE 07/13/2013  Patient:  Caleb Bauer, Caleb Bauer   Account Number:  [id number]  Date Initiated:  07/13/2013  Documentation initiated by:  Luz Lex  Subjective/Objective Assessment:   Admitted with gout flare up.  Lives at home alone.     Action/Plan:   Anticipated DC Date:  07/14/2013   Anticipated DC Plan:  [place]  CM consult      Choice offered to / List presented to:          [facility] arranged  HH-1 RN  [place].   Status of service:  In process, will continue to follow Medicare Important Message given?   (If response is "NO", the following Medicare IM given date fields will be blank) Date Medicare IM given:   Date Additional Medicare IM given:    Discharge Disposition:    Per UR Regulation:    If discussed at Long Length of Stay Meetings, dates discussed:    Comments:  Contact:  Simmons,Beatrice Mother [phone]                 Corns,Wanda Niece     [phone]  07-13-13 1:35pm Luz Lex, RNBSN [phone] Talked with patient.  Lives in a senior apt - independent, one level.  Functions well most of time.  Niece wanted someone to be able to come in and check on him.  Agreeable to [facility] services.  Has used [facility] in past and would like them to come back.  Discussed finding a PCP.  States has Medicaid also, but does not have an appointed physician to go to on his card.  He does know his case worker with [employer] and will call her.

## 2013-07-13 NOTE — Progress Notes (Signed)
Inpatient Diabetes Program Recommendations  AACE/ADA: New Consensus Statement on Inpatient Glycemic Control (2013)  Target Ranges:  Prepandial:   less than 140 mg/dL      Peak postprandial:   less than 180 mg/dL (1-2 hours)      Critically ill patients:  140 - 180 mg/dL    Inpatient Diabetes Program Recommendations Correction (SSI): Please add moderte correction tidwc  HgbA1C: Please check current A1C (last one in 2014) Diet: Please change diet to the new listing: heart/carb modified  Thank you, Rosita Kea, RN, CNS, Diabetes Coordinator [phone])

## 2013-07-13 NOTE — Progress Notes (Addendum)
PROGRESS NOTE  Caleb Bauer IWL:[id number] DOB: 13-Jan-1951 DOA: 07/12/2013 PCP: Laverda Page, MD  Assessment/Plan: Acute gouty arthritis  - Involving his right wrist and right knee. Patient claims that this episode is very similar to his usual gout flares. No fever, or leukocytosis to suggest any infectious arthritis.  -oral prednisone given stage III chronic kidney disease.- feeling better but does not think he can go home - PT eval for immanent d/c and possible home heal  Chronic systolic heart failure  - Community compensated, continue with Coreg, lisinopril, Demadex and Aldactone - Monitor in telemetry   . COPD (chronic obstructive pulmonary disease)  - Stable, as needed nebulized bronchodilators.   . CKD Stage 3  -at usual baseline, monitor lytes periodically   DM -SSI -watch closely whole on steroids    Code Status: full Family Communication: patient Disposition Plan: home soon  Consultants:    Procedures:    Antibiotics:    HPI/Subjective: Feeling better but does not think he can go home  Objective: Filed Vitals:   07/13/13 1016  BP: 114/60  Pulse:   Temp:   Resp:     Intake/Output Summary (Last 24 hours) at 07/13/13 1103 Last data filed at 07/13/13 1016  Gross per 24 hour  Intake    723 ml  Output    750 ml  Net    -27 ml   Filed Weights   07/12/13 1145 07/12/13 1404 07/13/13 0500  Weight: 86.637 kg (191 lb) 86.637 kg (191 lb) 84.6 kg (186 lb 8.2 oz)    Exam:   General:  A+Ox3, NAD  Cardiovascular: rrr  Respiratory: clear  Abdomen: +BS  Musculoskeletal: swelling in right hand and right knee   Data Reviewed: Basic Metabolic Panel:  Recent Labs Lab 07/12/13 1535 07/12/13 2018 07/13/13 0238  NA 142  --  137  K 3.4*  --  3.6*  CL 102  --  99  CO2  --   --  23  GLUCOSE 93  --  205*  BUN 20  --  28*  CREATININE 1.60* 1.39* 1.50*  CALCIUM  --   --  8.4   Liver Function Tests: No results found for this basename:  AST, ALT, ALKPHOS, BILITOT, PROT, ALBUMIN,  in the last 168 hours No results found for this basename: LIPASE, AMYLASE,  in the last 168 hours No results found for this basename: AMMONIA,  in the last 168 hours CBC:  Recent Labs Lab 07/12/13 1529 07/12/13 1535 07/12/13 2018 07/13/13 0238  WBC 8.9  --  7.8 8.0  HGB 9.2* 10.5* 8.9* 8.2*  HCT 28.8* 31.0* 28.3* 26.2*  MCV 75.4*  --  75.1* 75.5*  PLT 220  --  199 215   Cardiac Enzymes: No results found for this basename: CKTOTAL, CKMB, CKMBINDEX, TROPONINI,  in the last 168 hours BNP (last 3 results)  Recent Labs  03/23/13 1255 04/07/13 0223 07/04/13 1403  PROBNP >5000.0* 30408.0* 7985.0*   CBG:  Recent Labs Lab 07/12/13 2201 07/13/13 0623  GLUCAP 235* 144*    No results found for this or any previous visit (from the past 240 hour(s)).   Studies: No results found.  Scheduled Meds: . allopurinol  100 mg Oral Daily  . amiodarone  200 mg Oral Daily  . aspirin EC  81 mg Oral Daily  . carvedilol  3.125 mg Oral BID WC  . digoxin  0.125 mg Oral QODAY  . heparin  5,000 Units Subcutaneous 3 times per day  .  isosorbide dinitrate  10 mg Oral TID  . lisinopril  2.5 mg Oral Daily  . predniSONE  40 mg Oral Q breakfast  . sodium chloride  3 mL Intravenous Q12H  . sodium chloride  3 mL Intravenous Q12H  . spironolactone  25 mg Oral Daily  . [START ON 07/14/2013] torsemide  20 mg Oral Once per day on Mon Thu   Continuous Infusions:  Antibiotics Given (last 72 hours)   None      Active Problems:   COPD (chronic obstructive pulmonary disease)   Chronic systolic heart failure   Acute gouty arthritis    Time spent: 35 min    [facility] Pager [phone]. If 7PM-7AM, please contact night-coverage at [email or web address], password [facility] 07/13/2013, 11:03 AM  LOS: 1 day

## 2013-07-13 NOTE — Progress Notes (Signed)
Nursing note Telemetry DCd as ordered. Bettina Gavia Talbert Trembath RN

## 2013-07-13 NOTE — Progress Notes (Signed)
Nutrition Brief Note  Patient identified on the Malnutrition Screening Tool (MST) Report for recent weight lost without trying and eating poorly because of a decreased appetite.  Per weight readings below, pt's weight has been stable within the past month.  Wt Readings from Last 15 Encounters:  07/13/13 186 lb 8.2 oz (84.6 kg)  07/04/13 191 lb (86.637 kg)  06/20/13 188 lb (85.276 kg)  06/06/13 192 lb 8 oz (87.317 kg)  06/01/13 191 lb 6.4 oz (86.818 kg)  05/19/13 195 lb 8 oz (88.678 kg)  04/11/13 204 lb 5.9 oz (92.7 kg)  03/23/13 240 lb 6.4 oz (109.045 kg)  03/15/13 229 lb 1.6 oz (103.919 kg)  12/09/12 182 lb 6.4 oz (82.736 kg)  12/02/12 191 lb 9.3 oz (86.9 kg)  12/02/12 191 lb 9.3 oz (86.9 kg)  10/13/12 220 lb 14.4 oz (100.2 kg)  09/27/12 252 lb 10.4 oz (114.6 kg)    Body mass index is 25.29 kg/(m^2). Patient meets criteria for Overweight based on current BMI.   Current diet order is Heart Healthy/Carbohydrate Modified, patient is consuming approximately 100% of meals at this time. Labs and medications reviewed.   No nutrition interventions warranted at this time. If nutrition issues arise, please consult RD.   Arthur Holms, RD, LDN Pager #: [phone] After-Hours Pager #: [phone]

## 2013-07-14 LAB — GLUCOSE, CAPILLARY: Glucose-Capillary: 102 mg/dL — ABNORMAL HIGH (ref 70–99)

## 2013-07-14 MED ORDER — POTASSIUM CHLORIDE CRYS ER 20 MEQ PO TBCR
40.0000 meq | EXTENDED_RELEASE_TABLET | Freq: Once | ORAL | Status: AC
  Administered 2013-07-14: 40 meq via ORAL
  Filled 2013-07-14: qty 2

## 2013-07-14 MED ORDER — PREDNISONE 10 MG PO TABS: ORAL_TABLET | ORAL | Status: DC

## 2013-07-14 MED ORDER — HYDROCODONE-ACETAMINOPHEN 5-325 MG PO TABS: 1.0000 | ORAL_TABLET | ORAL | Status: DC | PRN

## 2013-07-14 NOTE — Progress Notes (Signed)
Physical Therapy Treatment Patient Details Name: Caleb Bauer MRN: [id number] DOB: [birth year] Today's Date: 07/14/2013    History of Present Illness Patient admitted with right knee and wrist/elbow pain with gout attack.  H/o systolic heart failure with EF 20-25%.    PT Comments    Pt admitted with above. Pt currently with functional limitations due to balance and endurance deficits.  Pt will benefit from skilled PT to increase their independence and safety with mobility to allow discharge to the venue listed below.   Follow Up Recommendations  Home health PT     Equipment Recommendations  None recommended by PT    Recommendations for Other Services       Precautions / Restrictions Precautions Precautions: Fall Restrictions Weight Bearing Restrictions: No    Mobility  Bed Mobility Overal bed mobility: Modified Independent                Transfers Overall transfer level: Modified independent Equipment used: Rolling walker (2 wheeled)                Ambulation/Gait Ambulation/Gait assistance: Modified independent (Device/Increase time) Ambulation Distance (Feet): 150 Feet Assistive device: Rolling walker (2 wheeled) Gait Pattern/deviations: Step-through pattern;Decreased stride length   Gait velocity interpretation: at or above normal speed for age/gender General Gait Details: Can ambulate short distance without RW.  Modif I with RW.  No balance issues noted.     Stairs            Wheelchair Mobility    Modified Rankin (Stroke Patients Only)       Balance Overall balance assessment: Needs assistance;History of Falls         Standing balance support: Bilateral upper extremity supported;During functional activity Standing balance-Leahy Scale: Good Standing balance comment: Improving and can stand statically without UE support.                      Cognition Arousal/Alertness: Awake/alert Behavior During Therapy: WFL for tasks  assessed/performed Overall Cognitive Status: Within Functional Limits for tasks assessed                      Exercises General Exercises - Lower Extremity Ankle Circles/Pumps: AROM;Both;10 reps;Seated Long Arc Quad: AROM;Both;10 reps;Seated Hip Flexion/Marching: AROM;Both;10 reps;Seated    General Comments        Pertinent Vitals/Pain VSS, No pain    Home Living                      Prior Function            PT Goals (current goals can now be found in the care plan section) Progress towards PT goals: Goals met/education completed, patient discharged from PT    Frequency       PT Plan Current plan remains appropriate    Co-evaluation             End of Session Equipment Utilized During Treatment: Gait belt Activity Tolerance: Patient limited by fatigue Patient left: in chair;with call bell/phone within reach     Time: 1049-1100 PT Time Calculation (min): 11 min  Charges:  $Gait Training: 8-22 mins                    G Codes:  Functional Assessment Tool Used: Clinical Judgement Functional Limitation: Mobility: Walking and moving around Mobility: Walking and Moving Around Goal Status [phone]): At least 1 percent but less than 20 percent  impaired, limited or restricted Mobility: Walking and Moving Around Discharge Status [phone]): 0 percent impaired, limited or restricted   [facility] 07/14/2013, 11:43 AM  [facility] [phone] (pager)

## 2013-07-14 NOTE — Progress Notes (Signed)
Physical Therapy Discharge Patient Details Name: Caleb Bauer MRN: [id number] DOB: 12/23/1950 Today's Date: 07/14/2013 Time: 1049-1100 PT Time Calculation (min): 11 min  Patient discharged from PT services secondary to goals met and no further PT needs identified.  Please see latest therapy progress note for current level of functioning and progress toward goals.    Progress and discharge plan discussed with patient and/or caregiver: Patient/Caregiver agrees with plan  GP Functional Assessment Tool Used: Clinical Judgement Functional Limitation: Mobility: Walking and moving around Mobility: Walking and Moving Around Goal Status (G8979): At least 1 percent but less than 20 percent impaired, limited or restricted Mobility: Walking and Moving Around Discharge Status (G8980): 0 percent impaired, limited or restricted   Dawn Ingold 07/14/2013, 11:43 AM Dawn Ingold,PT Acute Rehabilitation [phone] (pager)  

## 2013-07-14 NOTE — Discharge Summary (Signed)
Physician Discharge Summary  Caleb Bauer FBP:[id number] DOB: November 11, 1950 DOA: 07/12/2013  PCP: Laverda Page, MD  Admit date: 07/12/2013 Discharge date: 07/14/2013  Time spent: 35 minutes  Recommendations for Outpatient Follow-up:  1. Cbc, bmp 1 week 2. Needs to establish with PCP  Discharge Diagnoses:  Active Problems:   COPD (chronic obstructive pulmonary disease)   Chronic systolic heart failure   Acute gouty arthritis   Diabetes   Discharge Condition: improved  Diet recommendation: cardiac  Filed Weights   07/12/13 1404 07/13/13 0500 07/14/13 0459  Weight: 86.637 kg (191 lb) 84.6 kg (186 lb 8.2 oz) 85.412 kg (188 lb 4.8 oz)    History of present illness:  Caleb Bauer is a 63 y.o. male with a Past Medical History of chronic systolic heart failure with last EF around 20-25%, no history of gout who presents today with the above noted complaint. Per patient he was in his usual state of health, starting this past Sunday he started developing some mild pain and swelling on his right dorsal wrist and his right knee area. These gradually worsened, yesterday he had significant swelling and mild erythema around his right wrist and right knee. He denies any fever. Because of significant pain he was unable to ambulate, as a result he presented to the emergency room for further evaluation. He claims that this is how his gout flare presents, and claims that this is exactly how it feels like. In the ED he was given prednisone, however he was to unable to ambulate, as a result I was asked to admit this patient for further evaluation and treatment  Patient denies any fever, headache, chest pain, no worsening of usual shortness of breath, nausea, vomiting or diarrhea.   Hospital Course:  Acute gouty arthritis  - Involving his right wrist and right knee. Patient claims that this episode is very similar to his usual gout flares. No fever, or leukocytosis to suggest any infectious arthritis.   -oral prednisone given stage III chronic kidney disease.- feeling better   - PT eval rec home health   Chronic systolic heart failure  - Community compensated, continue with Coreg, lisinopril, Demadex and Aldactone   . COPD (chronic obstructive pulmonary disease)  - Stable, as needed nebulized bronchodilators.   . CKD Stage 3  -at usual baseline, monitor lytes periodically      Procedures:    Consultations:  none  Discharge Exam: Filed Vitals:   07/14/13 0459  BP: 138/73  Pulse: 60  Temp: 98 F (36.7 C)  Resp: 18    General: A+OX3, NAD- less joint swelling Cardiovascular: rrr Respiratory: clear anterior  Discharge Instructions You were cared for by a hospitalist during your hospital stay. If you have any questions about your discharge medications or the care you received while you were in the hospital after you are discharged, you can call the unit and asked to speak with the hospitalist on call if the hospitalist that took care of you is not available. Once you are discharged, your primary care physician will handle any further medical issues. Please note that NO REFILLS for any discharge medications will be authorized once you are discharged, as it is imperative that you return to your primary care physician (or establish a relationship with a primary care physician if you do not have one) for your aftercare needs so that they can reassess your need for medications and monitor your lab values.  Discharge Orders   Future Appointments Provider Department Dept Phone  08/08/2013 2:00 PM [facility] [phone]   Future Orders Complete By Expires   Diet - low sodium heart healthy  As directed    Discharge instructions  As directed    Increase activity slowly  As directed        Medication List         allopurinol 100 MG tablet  Commonly known as:  ZYLOPRIM  Take 1 tablet (100 mg total) by mouth daily.      amiodarone 200 MG tablet  Commonly known as:  PACERONE  Take 1 tablet (200 mg total) by mouth daily.     aspirin EC 81 MG tablet  Take 81 mg by mouth daily.     carvedilol 3.125 MG tablet  Commonly known as:  COREG  Take 1 tablet (3.125 mg total) by mouth 2 (two) times daily.     digoxin 0.125 MG tablet  Commonly known as:  LANOXIN  Take 1 tablet (0.125 mg total) by mouth every other day.     HYDROcodone-acetaminophen 5-325 MG per tablet  Commonly known as:  NORCO/VICODIN  Take 1-2 tablets by mouth every 4 (four) hours as needed for moderate pain.     isosorbide dinitrate 10 MG tablet  Commonly known as:  ISORDIL  Take 10 mg by mouth 3 (three) times daily.     lisinopril 2.5 MG tablet  Commonly known as:  PRINIVIL,ZESTRIL  Take 2.5 mg by mouth daily.     predniSONE 10 MG tablet  Commonly known as:  DELTASONE  30 mg x 3 days, 20 mg x 3 days, 10 mg x 3 days, 5 mg x 4 days then d/c     spironolactone 25 MG tablet  Commonly known as:  ALDACTONE  Take 25 mg by mouth daily.     torsemide 20 MG tablet  Commonly known as:  DEMADEX  Take 1 tablet (20 mg total) by mouth 2 (two) times a week. Every Monday and Friday, Hold for wt less than 193 lb       Allergies  Allergen Reactions  . Other Nausea And Vomiting    Patient stated drug is "kerein"      The results of significant diagnostics from this hospitalization (including imaging, microbiology, ancillary and laboratory) are listed below for reference.    Significant Diagnostic Studies: No results found.  Microbiology: No results found for this or any previous visit (from the past 240 hour(s)).   Labs: Basic Metabolic Panel:  Recent Labs Lab 07/12/13 1535 07/12/13 2018 07/13/13 0238  NA 142  --  137  K 3.4*  --  3.6*  CL 102  --  99  CO2  --   --  23  GLUCOSE 93  --  205*  BUN 20  --  28*  CREATININE 1.60* 1.39* 1.50*  CALCIUM  --   --  8.4   Liver Function Tests: No results found for this basename:  AST, ALT, ALKPHOS, BILITOT, PROT, ALBUMIN,  in the last 168 hours No results found for this basename: LIPASE, AMYLASE,  in the last 168 hours No results found for this basename: AMMONIA,  in the last 168 hours CBC:  Recent Labs Lab 07/12/13 1529 07/12/13 1535 07/12/13 2018 07/13/13 0238  WBC 8.9  --  7.8 8.0  HGB 9.2* 10.5* 8.9* 8.2*  HCT 28.8* 31.0* 28.3* 26.2*  MCV 75.4*  --  75.1* 75.5*  PLT 220  --  199 215  Cardiac Enzymes: No results found for this basename: CKTOTAL, CKMB, CKMBINDEX, TROPONINI,  in the last 168 hours BNP: BNP (last 3 results)  Recent Labs  03/23/13 1255 04/07/13 0223 07/04/13 1403  PROBNP >5000.0* 30408.0* 7985.0*   CBG:  Recent Labs Lab 07/12/13 2201 07/13/13 0623 07/13/13 1137 07/13/13 1655  GLUCAP 235* 144* 125* 119*       Signed:  Geradine Girt  [facility] 07/14/2013, 8:51 AM

## 2013-07-14 NOTE — Progress Notes (Signed)
Patient D/C'd with nurse. Discharge information reviewed with patient and niece. Patient and niece state no questions. Prescriptions handed to patient. IV D/C'd. Niece came to desk and stated they wanted to leave. Niece refused help transporting patient to discharge and pushed patient in wheelchair off unit.

## 2013-07-28 ENCOUNTER — Telehealth (HOSPITAL_COMMUNITY): Payer: Self-pay | Admitting: Cardiology

## 2013-07-28 NOTE — Telephone Encounter (Signed)
Niece called to request personal care service for her uncle States he could really benefit from having someone check on him regularly and assist with med etc  Please contact [employer] [phone] Spoke with Sonia Baller @ [employer] This company will assist with ADL's (bathing, eating, toileting etc) They are a PCS for medicaid pts only  Please advise if we should proceed further with referral

## 2013-08-08 ENCOUNTER — Inpatient Hospital Stay (HOSPITAL_COMMUNITY)
Admission: RE | Admit: 2013-08-08 | Discharge: 2013-08-08 | Disposition: A | Discharge status: Home / Self Care | Payer: Medicare Other | Source: Ambulatory Visit

## 2013-08-19 ENCOUNTER — Other Ambulatory Visit (HOSPITAL_COMMUNITY): Payer: Self-pay | Admitting: *Deleted

## 2013-08-22 ENCOUNTER — Encounter (HOSPITAL_COMMUNITY): Admission: RE | Admit: 2013-08-22 | Payer: PRIVATE HEALTH INSURANCE | Source: Ambulatory Visit

## 2013-08-22 ENCOUNTER — Encounter (HOSPITAL_COMMUNITY): Payer: Medicare Other

## 2013-08-29 ENCOUNTER — Encounter (HOSPITAL_COMMUNITY)
Admission: RE | Admit: 2013-08-29 | Discharge: 2013-08-29 | Disposition: A | Discharge status: Home / Self Care | Payer: Medicare Other | Source: Ambulatory Visit | Attending: Nephrology | Admitting: Nephrology

## 2013-08-29 DIAGNOSIS — N189 Chronic kidney disease, unspecified: Secondary | ICD-10-CM | POA: Insufficient documentation

## 2013-08-29 DIAGNOSIS — D638 Anemia in other chronic diseases classified elsewhere: Secondary | ICD-10-CM | POA: Insufficient documentation

## 2013-08-29 LAB — POCT HEMOGLOBIN-HEMACUE: Hemoglobin: 8.8 g/dL — ABNORMAL LOW (ref 13.0–17.0)

## 2013-08-29 MED ORDER — SODIUM CHLORIDE 0.9 % IV SOLN
1020.0000 mg | Freq: Once | INTRAVENOUS | Status: AC
  Administered 2013-08-29: 1020 mg via INTRAVENOUS
  Filled 2013-08-29: qty 34

## 2013-08-29 MED ORDER — EPOETIN ALFA 20000 UNIT/ML IJ SOLN
INTRAMUSCULAR | Status: AC
  Administered 2013-08-29: 20000 [IU] via SUBCUTANEOUS
  Filled 2013-08-29: qty 1

## 2013-08-29 MED ORDER — EPOETIN ALFA 10000 UNIT/ML IJ SOLN
INTRAMUSCULAR | Status: AC
  Filled 2013-08-29: qty 1

## 2013-08-29 MED ORDER — EPOETIN ALFA 20000 UNIT/ML IJ SOLN: 20000.0000 [IU] | INTRAMUSCULAR | Status: DC

## 2013-08-29 NOTE — Discharge Instructions (Signed)
Ferumoxytol injection °What is this medicine? °FERUMOXYTOL is an iron complex. Iron is used to make healthy red blood cells, which carry oxygen and nutrients throughout the body. This medicine is used to treat iron deficiency anemia in people with chronic kidney disease. °This medicine may be used for other purposes; ask your health care provider or pharmacist if you have questions. °COMMON BRAND NAME(S): Feraheme °What should I tell my health care provider before I take this medicine? °They need to know if you have any of these conditions: °-anemia not caused by low iron levels °-high levels of iron in the blood °-magnetic resonance imaging (MRI) test scheduled °-an unusual or allergic reaction to iron, other medicines, foods, dyes, or preservatives °-pregnant or trying to get pregnant °-breast-feeding °How should I use this medicine? °This medicine is for injection into a vein. It is given by a health care professional in a hospital or clinic setting. °Talk to your pediatrician regarding the use of this medicine in children. Special care may be needed. °Overdosage: If you think you've taken too much of this medicine contact a poison control center or emergency room at once. °Overdosage: If you think you have taken too much of this medicine contact a poison control center or emergency room at once. °NOTE: This medicine is only for you. Do not share this medicine with others. °What if I miss a dose? °It is important not to miss your dose. Call your doctor or health care professional if you are unable to keep an appointment. °What may interact with this medicine? °This medicine may interact with the following medications: °-other iron products °This list may not describe all possible interactions. Give your health care provider a list of all the medicines, herbs, non-prescription drugs, or dietary supplements you use. Also tell them if you smoke, drink alcohol, or use illegal drugs. Some items may interact with your  medicine. °What should I watch for while using this medicine? °Visit your doctor or healthcare professional regularly. Tell your doctor or healthcare professional if your symptoms do not start to get better or if they get worse. You may need blood work done while you are taking this medicine. °You may need to follow a special diet. Talk to your doctor. Foods that contain iron include: whole grains/cereals, dried fruits, beans, or peas, leafy green vegetables, and organ meats (liver, kidney). °What side effects may I notice from receiving this medicine? °Side effects that you should report to your doctor or health care professional as soon as possible: °-allergic reactions like skin rash, itching or hives, swelling of the face, lips, or tongue °-breathing problems °-changes in blood pressure °-feeling faint or lightheaded, falls °-fever or chills °-flushing, sweating, or hot feelings °-swelling of the ankles or feet °Side effects that usually do not require medical attention (Report these to your doctor or health care professional if they continue or are bothersome.): °-diarrhea °-headache °-nausea, vomiting °-stomach pain °This list may not describe all possible side effects. Call your doctor for medical advice about side effects. You may report side effects to FDA at 1-800-FDA-1088. °Where should I keep my medicine? °This drug is given in a hospital or clinic and will not be stored at home. °NOTE: This sheet is a summary. It may not cover all possible information. If you have questions about this medicine, talk to your doctor, pharmacist, or health care provider. °© 2015, [employer] Standard. (2011-10-03 15:23:36) °Epoetin Alfa injection °What is this medicine? °EPOETIN ALFA (e POE e tin AL   fa) helps your body make more red blood cells. This medicine is used to treat anemia caused by chronic kidney failure, cancer chemotherapy, or HIV-therapy. It may also be used before surgery if you have anemia. °This medicine  may be used for other purposes; ask your health care provider or pharmacist if you have questions. °COMMON BRAND NAME(S): Epogen, Procrit °What should I tell my health care provider before I take this medicine? °They need to know if you have any of these conditions: °-blood clotting disorders °-cancer patient not on chemotherapy °-cystic fibrosis °-heart disease, such as angina or heart failure °-hemoglobin level of 12 g/dL or greater °-high blood pressure °-low levels of folate, iron, or vitamin B12 °-seizures °-an unusual or allergic reaction to erythropoietin, albumin, benzyl alcohol, hamster proteins, other medicines, foods, dyes, or preservatives °-pregnant or trying to get pregnant °-breast-feeding °How should I use this medicine? °This medicine is for injection into a vein or under the skin. It is usually given by a health care professional in a hospital or clinic setting. °If you get this medicine at home, you will be taught how to prepare and give this medicine. Use exactly as directed. Take your medicine at regular intervals. Do not take your medicine more often than directed. °It is important that you put your used needles and syringes in a special sharps container. Do not put them in a trash can. If you do not have a sharps container, call your pharmacist or healthcare provider to get one. °Talk to your pediatrician regarding the use of this medicine in children. While this drug may be prescribed for selected conditions, precautions do apply. °Overdosage: If you think you have taken too much of this medicine contact a poison control center or emergency room at once. °NOTE: This medicine is only for you. Do not share this medicine with others. °What if I miss a dose? °If you miss a dose, take it as soon as you can. If it is almost time for your next dose, take only that dose. Do not take double or extra doses. °What may interact with this medicine? °Do not take this medicine with any of the following  medications: °-darbepoetin alfa °This list may not describe all possible interactions. Give your health care provider a list of all the medicines, herbs, non-prescription drugs, or dietary supplements you use. Also tell them if you smoke, drink alcohol, or use illegal drugs. Some items may interact with your medicine. °What should I watch for while using this medicine? °Visit your prescriber or health care professional for regular checks on your progress and for the needed blood tests and blood pressure measurements. It is especially important for the doctor to make sure your hemoglobin level is in the desired range, to limit the risk of potential side effects and to give you the best benefit. Keep all appointments for any recommended tests. Check your blood pressure as directed. Ask your doctor what your blood pressure should be and when you should contact him or her. °As your body makes more red blood cells, you may need to take iron, folic acid, or vitamin B supplements. Ask your doctor or health care provider which products are right for you. If you have kidney disease continue dietary restrictions, even though this medication can make you feel better. Talk with your doctor or health care professional about the foods you eat and the vitamins that you take. °What side effects may I notice from receiving this medicine? °Side effects that you   should report to your doctor or health care professional as soon as possible: °-allergic reactions like skin rash, itching or hives, swelling of the face, lips, or tongue °-breathing problems °-changes in vision °-chest pain °-confusion, trouble speaking or understanding °-feeling faint or lightheaded, falls °-high blood pressure °-muscle aches or pains °-pain, swelling, warmth in the leg °-rapid weight gain °-severe headaches °-sudden numbness or weakness of the face, arm or leg °-trouble walking, dizziness, loss of balance or coordination °-seizures (convulsions) °-swelling  of the ankles, feet, hands °-unusually weak or tired °Side effects that usually do not require medical attention (report to your doctor or health care professional if they continue or are bothersome): °-diarrhea °-fever, chills (flu-like symptoms) °-headaches °-nausea, vomiting °-redness, stinging, or swelling at site where injected °This list may not describe all possible side effects. Call your doctor for medical advice about side effects. You may report side effects to FDA at 1-800-FDA-1088. °Where should I keep my medicine? °Keep out of the reach of children. °Store in a refrigerator between 2 and 8 degrees C (36 and 46 degrees F). Do not freeze or shake. Throw away any unused portion if using a single-dose vial. Multi-dose vials can be kept in the refrigerator for up to 21 days after the initial dose. Throw away unused medicine. °NOTE: This sheet is a summary. It may not cover all possible information. If you have questions about this medicine, talk to your doctor, pharmacist, or health care provider. °© 2015, Elsevier/Gold Standard. (2008-02-01 10:25:44) ° °

## 2013-09-09 ENCOUNTER — Other Ambulatory Visit (HOSPITAL_COMMUNITY): Payer: Self-pay | Admitting: *Deleted

## 2013-09-11 ENCOUNTER — Encounter (HOSPITAL_COMMUNITY): Payer: Self-pay | Admitting: Emergency Medicine

## 2013-09-11 ENCOUNTER — Emergency Department (HOSPITAL_COMMUNITY): Payer: Medicare Other

## 2013-09-11 ENCOUNTER — Inpatient Hospital Stay (HOSPITAL_COMMUNITY)
Admission: EM | Admit: 2013-09-11 | Discharge: 2013-09-15 | DRG: 554 | Disposition: A | Discharge status: Home Health | Payer: Medicare Other | Attending: Internal Medicine | Admitting: Internal Medicine

## 2013-09-11 DIAGNOSIS — I509 Heart failure, unspecified: Secondary | ICD-10-CM | POA: Diagnosis present

## 2013-09-11 DIAGNOSIS — J4489 Other specified chronic obstructive pulmonary disease: Secondary | ICD-10-CM | POA: Diagnosis present

## 2013-09-11 DIAGNOSIS — N183 Chronic kidney disease, stage 3 unspecified: Secondary | ICD-10-CM | POA: Diagnosis present

## 2013-09-11 DIAGNOSIS — E872 Acidosis, unspecified: Secondary | ICD-10-CM | POA: Diagnosis present

## 2013-09-11 DIAGNOSIS — Z7982 Long term (current) use of aspirin: Secondary | ICD-10-CM

## 2013-09-11 DIAGNOSIS — I129 Hypertensive chronic kidney disease with stage 1 through stage 4 chronic kidney disease, or unspecified chronic kidney disease: Secondary | ICD-10-CM | POA: Diagnosis present

## 2013-09-11 DIAGNOSIS — Z87891 Personal history of nicotine dependence: Secondary | ICD-10-CM

## 2013-09-11 DIAGNOSIS — J449 Chronic obstructive pulmonary disease, unspecified: Secondary | ICD-10-CM | POA: Diagnosis present

## 2013-09-11 DIAGNOSIS — D649 Anemia, unspecified: Secondary | ICD-10-CM | POA: Diagnosis present

## 2013-09-11 DIAGNOSIS — K219 Gastro-esophageal reflux disease without esophagitis: Secondary | ICD-10-CM | POA: Diagnosis present

## 2013-09-11 DIAGNOSIS — M1009 Idiopathic gout, multiple sites: Secondary | ICD-10-CM

## 2013-09-11 DIAGNOSIS — I4891 Unspecified atrial fibrillation: Secondary | ICD-10-CM | POA: Diagnosis present

## 2013-09-11 DIAGNOSIS — Z79899 Other long term (current) drug therapy: Secondary | ICD-10-CM

## 2013-09-11 DIAGNOSIS — D696 Thrombocytopenia, unspecified: Secondary | ICD-10-CM | POA: Diagnosis present

## 2013-09-11 DIAGNOSIS — N189 Chronic kidney disease, unspecified: Secondary | ICD-10-CM

## 2013-09-11 DIAGNOSIS — N179 Acute kidney failure, unspecified: Secondary | ICD-10-CM | POA: Diagnosis present

## 2013-09-11 DIAGNOSIS — M109 Gout, unspecified: Secondary | ICD-10-CM

## 2013-09-11 DIAGNOSIS — I428 Other cardiomyopathies: Secondary | ICD-10-CM | POA: Diagnosis present

## 2013-09-11 DIAGNOSIS — E119 Type 2 diabetes mellitus without complications: Secondary | ICD-10-CM | POA: Diagnosis present

## 2013-09-11 DIAGNOSIS — M1990 Unspecified osteoarthritis, unspecified site: Secondary | ICD-10-CM | POA: Diagnosis present

## 2013-09-11 DIAGNOSIS — I5022 Chronic systolic (congestive) heart failure: Secondary | ICD-10-CM | POA: Diagnosis present

## 2013-09-11 DIAGNOSIS — N390 Urinary tract infection, site not specified: Secondary | ICD-10-CM | POA: Diagnosis present

## 2013-09-11 DIAGNOSIS — I4892 Unspecified atrial flutter: Secondary | ICD-10-CM | POA: Diagnosis present

## 2013-09-11 DIAGNOSIS — M6282 Rhabdomyolysis: Secondary | ICD-10-CM | POA: Diagnosis present

## 2013-09-11 MED ORDER — SODIUM CHLORIDE 0.9 % IV BOLUS (SEPSIS)
1000.0000 mL | Freq: Once | INTRAVENOUS | Status: AC
  Administered 2013-09-12: 1000 mL via INTRAVENOUS

## 2013-09-11 MED ORDER — OXYCODONE-ACETAMINOPHEN 5-325 MG PO TABS
2.0000 | ORAL_TABLET | Freq: Once | ORAL | Status: AC
  Administered 2013-09-12: 2 via ORAL
  Filled 2013-09-11: qty 2

## 2013-09-11 MED ORDER — PREDNISONE 20 MG PO TABS
60.0000 mg | ORAL_TABLET | Freq: Once | ORAL | Status: AC
  Administered 2013-09-12: 60 mg via ORAL
  Filled 2013-09-11: qty 3

## 2013-09-11 NOTE — ED Notes (Signed)
Patient here with complaint of gout flare which started yesterday afternoon. Explains that normally he has pain on the right but this time it is on the left. States that the pain extends from his left shoulder to wrist and that also his left leg and knee hurts. States he has never had gout pain on the left before. Has missed 2 days worth of medications because of decreased ability to move and complete tasks secondary to gout pain.

## 2013-09-11 NOTE — ED Provider Notes (Signed)
CSN: [id number]     Arrival date & time 09/11/13  2039 History   First MD Initiated Contact with Patient 09/11/13 2256     Chief Complaint  Patient presents with  . Gout     (Consider location/radiation/quality/duration/timing/severity/associated sxs/prior Treatment) The history is provided by the patient.    Patient with hx gout, DM, CHF, CKD, COPD presents with left sided gout pain.  States he took a nap on the cough yesterday morning, when he woke up he was unable to get off of the couch because he was having such severe pain in his joints on the left side.  Pain is located in his left shoulder, elbow, wrist, and throughout left hand, also somewhat in his left hip and left knee.  States this feels exactly like his previous gout, which usually affects his right side.  States he was stuck on his couch for the past two days, has been unable to take his medications or eat or drink until family came and found him on the couch.  Denies falls or recent injury.  Denies fevers, CP, SOB - does note cough with white sputum, which is abnormal for him.  Denies weakness or numbness of the extremities.   Denies any change in his diet or any changes in his activities.  He is unsure what has caused his gout to flare up.    Past Medical History  Diagnosis Date  . Diabetes mellitus without complication   . CHF (congestive heart failure)   . Hypertension   . Cardiomyopathy, dilated   . Small bowel obstruction 10/2012  . COPD (chronic obstructive pulmonary disease)   . Herpes     BUTT AND BACK- 9/23 HEALED  . Atrial flutter by electrocardiogram 11/2012  . GERD (gastroesophageal reflux disease)   . Gout    Past Surgical History  Procedure Laterality Date  . Laparoscopic incisional / umbilical / ventral hernia repair     Family History  Problem Relation Age of Onset  . Cancer Neg Hx    History  Substance Use Topics  . Smoking status: Former Smoker -- 0.25 packs/day for .5 years    Types:  Cigarettes    Quit date: 09/28/2012  . Smokeless tobacco: Never Used  . Alcohol Use: No    Review of Systems  All other systems reviewed and are negative.     Allergies  Other  Home Medications   Prior to Admission medications   Medication Sig Start Date End Date Taking? Authorizing Provider  allopurinol (ZYLOPRIM) 100 MG tablet Take 1 tablet (100 mg total) by mouth daily. 07/04/13  Yes Larey Dresser, MD  amiodarone (PACERONE) 200 MG tablet Take 1 tablet (200 mg total) by mouth daily. 06/06/13  Yes Jolaine Artist, MD  aspirin EC 81 MG tablet Take 81 mg by mouth daily.   Yes Historical Provider, MD  carvedilol (COREG) 3.125 MG tablet Take 1 tablet (3.125 mg total) by mouth 2 (two) times daily. 06/06/13  Yes Jolaine Artist, MD  digoxin (LANOXIN) 0.125 MG tablet Take 1 tablet (0.125 mg total) by mouth every other day. 07/04/13  Yes Larey Dresser, MD  isosorbide dinitrate (ISORDIL) 10 MG tablet Take 10 mg by mouth 3 (three) times daily.   Yes Historical Provider, MD  lisinopril (PRINIVIL,ZESTRIL) 2.5 MG tablet Take 2.5 mg by mouth daily.   Yes Historical Provider, MD  spironolactone (ALDACTONE) 25 MG tablet Take 25 mg by mouth daily.   Yes Historical Provider, MD  torsemide (DEMADEX) 20 MG tablet Take 1 tablet (20 mg total) by mouth 2 (two) times a week. Every Monday and Friday, Hold for wt less than 193 lb 06/13/13  Yes Jolaine Artist, MD   BP 122/76  Pulse 106  Temp(Src) 97.1 F (36.2 C) (Oral)  Resp 23  Ht 6' (1.829 m)  Wt 180 lb (81.647 kg)  BMI 24.41 kg/m2  SpO2 97% Physical Exam  Nursing note and vitals reviewed. Constitutional: He appears well-developed and well-nourished. No distress.  HENT:  Head: Normocephalic and atraumatic.  Neck: Neck supple.  Cardiovascular: Normal rate, regular rhythm and intact distal pulses.   Pulmonary/Chest: Effort normal and breath sounds normal. No respiratory distress. He has no wheezes. He has no rales.  Abdominal: Soft. He  exhibits no distension and no mass. There is no tenderness. There is no rebound and no guarding.  Musculoskeletal:  Tenderness throughout joints on left, edema and warmth particularly of left hand and fingers.  Left knee diffusely tender without erythema or edema.    Neurological: He is alert. He exhibits normal muscle tone.  Skin: He is not diaphoretic.    ED Course  Procedures (including critical care time) Labs Review Labs Reviewed  CBC WITH DIFFERENTIAL - Abnormal; Notable for the following:    WBC 12.2 (*)    RBC 4.16 (*)    Hemoglobin 10.6 (*)    HCT 33.1 (*)    MCH 25.5 (*)    RDW 17.7 (*)    Platelets 148 (*)    Neutrophils Relative % 83 (*)    Neutro Abs 10.1 (*)    Lymphocytes Relative 7 (*)    Monocytes Absolute 1.3 (*)    All other components within normal limits  BASIC METABOLIC PANEL - Abnormal; Notable for the following:    CO2 16 (*)    Glucose, Bld 132 (*)    BUN 62 (*)    Creatinine, Ser 2.40 (*)    GFR calc non Af Amer 27 (*)    GFR calc Af Amer 32 (*)    Anion gap 21 (*)    All other components within normal limits  CK - Abnormal; Notable for the following:    Total CK 833 (*)    All other components within normal limits  URIC ACID - Abnormal; Notable for the following:    Uric Acid, Serum 10.3 (*)    All other components within normal limits  CBG MONITORING, ED - Abnormal; Notable for the following:    Glucose-Capillary 135 (*)    All other components within normal limits  URINE CULTURE  SEDIMENTATION RATE  C-REACTIVE PROTEIN  URINALYSIS, ROUTINE W REFLEX MICROSCOPIC    Imaging Review Dg Chest 2 View  09/11/2013   CLINICAL DATA:  Gout and shortness of breath  EXAM: CHEST  2 VIEW  COMPARISON:  04/07/2013  FINDINGS: Chronic cardiopericardial enlargement. Upper mediastinal contours are stable from prior. The retrocardiac lung and lateral left diaphragm is poorly visualized on a chronic basis, favoring chronic changes from the cardiomegaly. There is  no evidence of pneumonia in the lateral projection. No edema, effusion, or pneumothorax.  IMPRESSION: Stable exam.  No evidence of acute cardiopulmonary disease.   Electronically Signed   By: Jorje Guild M.D.   On: 09/11/2013 23:56     EKG Interpretation None      Briefly discussed pt with Dr Roxanne Mins.  MDM   Final diagnoses:  Acute idiopathic gout of multiple sites  Acute renal  failure, unspecified acute renal failure type    Pt with hx gout with flare involving entire right side, unable to ambulate.  Recent admission (May 2015) for similar presentation.  Pt stuck on couch for two days without food or water or any of his medications.  CK elevated 833, renal function decreased to BUN creat 62 and 2.4 from previous 28 and 1.5.  CO2 16 and gap calculated at 21 - possibly from dehydration.  IVF, prednisone, pain medications given.  Admitted to [facility],    [place], [place] 09/12/13 0144

## 2013-09-12 ENCOUNTER — Encounter (HOSPITAL_COMMUNITY): Payer: Medicare Other

## 2013-09-12 ENCOUNTER — Inpatient Hospital Stay (HOSPITAL_COMMUNITY): Payer: Medicare Other

## 2013-09-12 ENCOUNTER — Encounter (HOSPITAL_COMMUNITY): Payer: Self-pay | Admitting: Internal Medicine

## 2013-09-12 DIAGNOSIS — M109 Gout, unspecified: Secondary | ICD-10-CM | POA: Insufficient documentation

## 2013-09-12 DIAGNOSIS — N183 Chronic kidney disease, stage 3 unspecified: Secondary | ICD-10-CM | POA: Diagnosis present

## 2013-09-12 DIAGNOSIS — N179 Acute kidney failure, unspecified: Secondary | ICD-10-CM | POA: Diagnosis present

## 2013-09-12 DIAGNOSIS — N189 Chronic kidney disease, unspecified: Secondary | ICD-10-CM

## 2013-09-12 HISTORY — DX: Gout, unspecified: M10.9

## 2013-09-12 LAB — CBC WITH DIFFERENTIAL/PLATELET
BASOS ABS: 0 10*3/uL (ref 0.0–0.1)
Basophils Absolute: 0 10*3/uL (ref 0.0–0.1)
Basophils Relative: 0 % (ref 0–1)
Basophils Relative: 0 % (ref 0–1)
EOS ABS: 0 10*3/uL (ref 0.0–0.7)
Eosinophils Absolute: 0 10*3/uL (ref 0.0–0.7)
Eosinophils Relative: 0 % (ref 0–5)
Eosinophils Relative: 0 % (ref 0–5)
HCT: 33.1 % — ABNORMAL LOW (ref 39.0–52.0)
HCT: 28.9 % — ABNORMAL LOW (ref 39.0–52.0)
HEMOGLOBIN: 10.6 g/dL — AB (ref 13.0–17.0)
Hemoglobin: 9 g/dL — ABNORMAL LOW (ref 13.0–17.0)
LYMPHS PCT: 7 % — AB (ref 12–46)
Lymphocytes Relative: 6 % — ABNORMAL LOW (ref 12–46)
Lymphs Abs: 0.9 10*3/uL (ref 0.7–4.0)
Lymphs Abs: 0.7 10*3/uL (ref 0.7–4.0)
MCH: 25.5 pg — ABNORMAL LOW (ref 26.0–34.0)
MCH: 25 pg — ABNORMAL LOW (ref 26.0–34.0)
MCHC: 32 g/dL (ref 30.0–36.0)
MCHC: 31.1 g/dL (ref 30.0–36.0)
MCV: 79.6 fL (ref 78.0–100.0)
MCV: 80.3 fL (ref 78.0–100.0)
Monocytes Absolute: 1.3 10*3/uL — ABNORMAL HIGH (ref 0.1–1.0)
Monocytes Absolute: 0.8 10*3/uL (ref 0.1–1.0)
Monocytes Relative: 10 % (ref 3–12)
Monocytes Relative: 6 % (ref 3–12)
NEUTROS ABS: 10.9 10*3/uL — AB (ref 1.7–7.7)
NEUTROS ABS: 10.1 10*3/uL — AB (ref 1.7–7.7)
Neutrophils Relative %: 83 % — ABNORMAL HIGH (ref 43–77)
Neutrophils Relative %: 88 % — ABNORMAL HIGH (ref 43–77)
PLATELETS: 162 10*3/uL (ref 150–400)
PLATELETS: 148 10*3/uL — AB (ref 150–400)
RBC: 4.16 MIL/uL — ABNORMAL LOW (ref 4.22–5.81)
RBC: 3.6 MIL/uL — ABNORMAL LOW (ref 4.22–5.81)
RDW: 17.7 % — ABNORMAL HIGH (ref 11.5–15.5)
RDW: 17.7 % — AB (ref 11.5–15.5)
WBC: 12.3 10*3/uL — ABNORMAL HIGH (ref 4.0–10.5)
WBC: 12.2 10*3/uL — AB (ref 4.0–10.5)

## 2013-09-12 LAB — COMPREHENSIVE METABOLIC PANEL
ALT: 11 U/L (ref 0–53)
ANION GAP: 18 — AB (ref 5–15)
AST: 25 U/L (ref 0–37)
Albumin: 2.5 g/dL — ABNORMAL LOW (ref 3.5–5.2)
Alkaline Phosphatase: 88 U/L (ref 39–117)
BUN: 64 mg/dL — AB (ref 6–23)
CO2: 16 meq/L — AB (ref 19–32)
Calcium: 8.9 mg/dL (ref 8.4–10.5)
Chloride: 105 mEq/L (ref 96–112)
Creatinine, Ser: 2.51 mg/dL — ABNORMAL HIGH (ref 0.50–1.35)
GFR, EST AFRICAN AMERICAN: 30 mL/min — AB (ref >90)
GFR, EST NON AFRICAN AMERICAN: 26 mL/min — AB (ref >90)
GLUCOSE: 146 mg/dL — AB (ref 70–99)
POTASSIUM: 5.2 meq/L (ref 3.7–5.3)
Sodium: 139 mEq/L (ref 137–147)
TOTAL PROTEIN: 8 g/dL (ref 6.0–8.3)
Total Bilirubin: 0.6 mg/dL (ref 0.3–1.2)

## 2013-09-12 LAB — LACTIC ACID, PLASMA: LACTIC ACID, VENOUS: 1.1 mmol/L (ref 0.5–2.2)

## 2013-09-12 LAB — GLUCOSE, CAPILLARY
GLUCOSE-CAPILLARY: 125 mg/dL — AB (ref 70–99)
GLUCOSE-CAPILLARY: 188 mg/dL — AB (ref 70–99)
GLUCOSE-CAPILLARY: 154 mg/dL — AB (ref 70–99)
Glucose-Capillary: 180 mg/dL — ABNORMAL HIGH (ref 70–99)
Glucose-Capillary: 142 mg/dL — ABNORMAL HIGH (ref 70–99)

## 2013-09-12 LAB — BASIC METABOLIC PANEL
Anion gap: 21 — ABNORMAL HIGH (ref 5–15)
BUN: 62 mg/dL — ABNORMAL HIGH (ref 6–23)
CHLORIDE: 104 meq/L (ref 96–112)
CO2: 16 mEq/L — ABNORMAL LOW (ref 19–32)
Calcium: 9.6 mg/dL (ref 8.4–10.5)
Creatinine, Ser: 2.4 mg/dL — ABNORMAL HIGH (ref 0.50–1.35)
GFR calc Af Amer: 32 mL/min — ABNORMAL LOW (ref >90)
GFR, EST NON AFRICAN AMERICAN: 27 mL/min — AB (ref >90)
GLUCOSE: 132 mg/dL — AB (ref 70–99)
POTASSIUM: 4.8 meq/L (ref 3.7–5.3)
SODIUM: 141 meq/L (ref 137–147)

## 2013-09-12 LAB — CK
CK TOTAL: 706 U/L — AB (ref 7–232)
Total CK: 833 U/L — ABNORMAL HIGH (ref 7–232)

## 2013-09-12 LAB — URINE MICROSCOPIC-ADD ON

## 2013-09-12 LAB — C-REACTIVE PROTEIN: CRP: 37.7 mg/dL — AB (ref <0.60)

## 2013-09-12 LAB — HEPATIC FUNCTION PANEL
ALBUMIN: 2.5 g/dL — AB (ref 3.5–5.2)
ALT: 10 U/L (ref 0–53)
AST: 26 U/L (ref 0–37)
Alkaline Phosphatase: 82 U/L (ref 39–117)
BILIRUBIN TOTAL: 0.6 mg/dL (ref 0.3–1.2)
Bilirubin, Direct: 0.3 mg/dL (ref 0.0–0.3)
Indirect Bilirubin: 0.3 mg/dL (ref 0.3–0.9)
Total Protein: 7.8 g/dL (ref 6.0–8.3)

## 2013-09-12 LAB — TROPONIN I: Troponin I: <0.3 ng/mL (ref <0.30)

## 2013-09-12 LAB — SALICYLATE LEVEL: Salicylate Lvl: <2 mg/dL — ABNORMAL LOW (ref 2.8–20.0)

## 2013-09-12 LAB — URINALYSIS, ROUTINE W REFLEX MICROSCOPIC
Glucose, UA: NEGATIVE mg/dL
Ketones, ur: NEGATIVE mg/dL
NITRITE: NEGATIVE
PH: 5 (ref 5.0–8.0)
PROTEIN: 30 mg/dL — AB
Specific Gravity, Urine: 1.019 (ref 1.005–1.030)
Urobilinogen, UA: 1 mg/dL (ref 0.0–1.0)

## 2013-09-12 LAB — PATHOLOGIST SMEAR REVIEW

## 2013-09-12 LAB — SEDIMENTATION RATE: Sed Rate: 125 mm/hr — ABNORMAL HIGH (ref 0–16)

## 2013-09-12 LAB — CREATININE, URINE, RANDOM: Creatinine, Urine: 162.47 mg/dL

## 2013-09-12 LAB — SODIUM, URINE, RANDOM: Sodium, Ur: 27 mEq/L

## 2013-09-12 LAB — LACTATE DEHYDROGENASE: LDH: 186 U/L (ref 94–250)

## 2013-09-12 LAB — PROCALCITONIN: Procalcitonin: 16.14 ng/mL

## 2013-09-12 LAB — URIC ACID: URIC ACID, SERUM: 10.3 mg/dL — AB (ref 4.0–7.8)

## 2013-09-12 LAB — DIGOXIN LEVEL

## 2013-09-12 LAB — CBG MONITORING, ED: GLUCOSE-CAPILLARY: 135 mg/dL — AB (ref 70–99)

## 2013-09-12 MED ORDER — ONDANSETRON HCL 4 MG PO TABS: 4.0000 mg | ORAL_TABLET | Freq: Four times a day (QID) | ORAL | Status: DC | PRN

## 2013-09-12 MED ORDER — HYDROMORPHONE HCL PF 1 MG/ML IJ SOLN: 1.0000 mg | INTRAMUSCULAR | Status: DC | PRN

## 2013-09-12 MED ORDER — IPRATROPIUM BROMIDE 0.02 % IN SOLN
0.5000 mg | RESPIRATORY_TRACT | Status: DC
Start: 2013-09-12 — End: 2013-09-12

## 2013-09-12 MED ORDER — DEXTROSE 5 % IV SOLN
1.0000 g | INTRAVENOUS | Status: DC
  Administered 2013-09-12 – 2013-09-15 (×4): 1 g via INTRAVENOUS
  Filled 2013-09-12 (×4): qty 10

## 2013-09-12 MED ORDER — AMIODARONE HCL 200 MG PO TABS
200.0000 mg | ORAL_TABLET | Freq: Every day | ORAL | Status: DC
  Administered 2013-09-12 – 2013-09-15 (×4): 200 mg via ORAL
  Filled 2013-09-12 (×4): qty 1

## 2013-09-12 MED ORDER — SODIUM CHLORIDE 0.9 % IJ SOLN
3.0000 mL | Freq: Two times a day (BID) | INTRAMUSCULAR | Status: DC
  Administered 2013-09-12 – 2013-09-15 (×6): 3 mL via INTRAVENOUS

## 2013-09-12 MED ORDER — ALLOPURINOL 100 MG PO TABS
100.0000 mg | ORAL_TABLET | Freq: Every day | ORAL | Status: DC
  Administered 2013-09-12 – 2013-09-15 (×4): 100 mg via ORAL
  Filled 2013-09-12 (×4): qty 1

## 2013-09-12 MED ORDER — COLCHICINE 0.6 MG PO TABS
0.6000 mg | ORAL_TABLET | Freq: Every day | ORAL | Status: DC
  Administered 2013-09-13 – 2013-09-15 (×3): 0.6 mg via ORAL
  Filled 2013-09-12 (×3): qty 1

## 2013-09-12 MED ORDER — ONDANSETRON HCL 4 MG/2ML IJ SOLN: 4.0000 mg | Freq: Four times a day (QID) | INTRAMUSCULAR | Status: DC | PRN

## 2013-09-12 MED ORDER — CARVEDILOL 3.125 MG PO TABS
3.1250 mg | ORAL_TABLET | Freq: Two times a day (BID) | ORAL | Status: DC
  Administered 2013-09-12 – 2013-09-15 (×7): 3.125 mg via ORAL
  Filled 2013-09-12 (×9): qty 1

## 2013-09-12 MED ORDER — ALBUTEROL SULFATE (2.5 MG/3ML) 0.083% IN NEBU: 2.5000 mg | INHALATION_SOLUTION | RESPIRATORY_TRACT | Status: DC

## 2013-09-12 MED ORDER — ONDANSETRON HCL 4 MG/2ML IJ SOLN
4.0000 mg | Freq: Once | INTRAMUSCULAR | Status: AC
  Administered 2013-09-12: 4 mg via INTRAVENOUS
  Filled 2013-09-12: qty 2

## 2013-09-12 MED ORDER — METHYLPREDNISOLONE SODIUM SUCC 40 MG IJ SOLR
40.0000 mg | Freq: Every day | INTRAMUSCULAR | Status: DC
  Administered 2013-09-12 – 2013-09-13 (×2): 40 mg via INTRAVENOUS
  Filled 2013-09-12 (×2): qty 1

## 2013-09-12 MED ORDER — ALBUTEROL SULFATE (2.5 MG/3ML) 0.083% IN NEBU: 2.5000 mg | INHALATION_SOLUTION | RESPIRATORY_TRACT | Status: DC | PRN

## 2013-09-12 MED ORDER — ISOSORBIDE DINITRATE 10 MG PO TABS
10.0000 mg | ORAL_TABLET | Freq: Three times a day (TID) | ORAL | Status: DC
  Administered 2013-09-12 – 2013-09-15 (×10): 10 mg via ORAL
  Filled 2013-09-12 (×12): qty 1

## 2013-09-12 MED ORDER — HYDROMORPHONE HCL PF 1 MG/ML IJ SOLN: 0.5000 mg | INTRAMUSCULAR | Status: DC | PRN

## 2013-09-12 MED ORDER — SODIUM CHLORIDE 0.9 % IV SOLN
INTRAVENOUS | Status: AC
  Administered 2013-09-12: 07:00:00 via INTRAVENOUS

## 2013-09-12 MED ORDER — HYDROMORPHONE HCL PF 1 MG/ML IJ SOLN
1.0000 mg | INTRAMUSCULAR | Status: AC
  Administered 2013-09-12: 1 mg via INTRAVENOUS
  Filled 2013-09-12: qty 1

## 2013-09-12 MED ORDER — INSULIN ASPART 100 UNIT/ML ~~LOC~~ SOLN
0.0000 [IU] | Freq: Three times a day (TID) | SUBCUTANEOUS | Status: DC
  Administered 2013-09-12 (×2): 2 [IU] via SUBCUTANEOUS
  Administered 2013-09-12: 1 [IU] via SUBCUTANEOUS
  Administered 2013-09-13 – 2013-09-14 (×5): 2 [IU] via SUBCUTANEOUS
  Administered 2013-09-15: 1 [IU] via SUBCUTANEOUS

## 2013-09-12 MED ORDER — COLCHICINE 0.6 MG PO TABS
1.2000 mg | ORAL_TABLET | Freq: Once | ORAL | Status: AC
  Administered 2013-09-12: 1.2 mg via ORAL
  Filled 2013-09-12: qty 2

## 2013-09-12 MED ORDER — ACETAMINOPHEN 325 MG PO TABS: 650.0000 mg | ORAL_TABLET | Freq: Four times a day (QID) | ORAL | Status: DC | PRN

## 2013-09-12 MED ORDER — ONDANSETRON HCL 4 MG/2ML IJ SOLN: 4.0000 mg | Freq: Three times a day (TID) | INTRAMUSCULAR | Status: DC | PRN

## 2013-09-12 MED ORDER — IPRATROPIUM BROMIDE 0.02 % IN SOLN: 0.5000 mg | RESPIRATORY_TRACT | Status: DC

## 2013-09-12 MED ORDER — ASPIRIN EC 81 MG PO TBEC
81.0000 mg | DELAYED_RELEASE_TABLET | Freq: Every day | ORAL | Status: DC
  Administered 2013-09-12 – 2013-09-15 (×4): 81 mg via ORAL
  Filled 2013-09-12 (×4): qty 1

## 2013-09-12 MED ORDER — ACETAMINOPHEN 650 MG RE SUPP: 650.0000 mg | Freq: Four times a day (QID) | RECTAL | Status: DC | PRN

## 2013-09-12 MED ORDER — BUDESONIDE 0.25 MG/2ML IN SUSP
0.2500 mg | Freq: Two times a day (BID) | RESPIRATORY_TRACT | Status: DC
  Administered 2013-09-12 – 2013-09-15 (×7): 0.25 mg via RESPIRATORY_TRACT
  Filled 2013-09-12 (×9): qty 2

## 2013-09-12 NOTE — ED Provider Notes (Signed)
Medical screening examination/treatment/procedure(s) were performed by non-physician practitioner and as supervising physician I was immediately available for consultation/collaboration.   EKG Interpretation   Date/Time:  Monday September 12 2013 01:02:30 EDT Ventricular Rate:  101 PR Interval:  179 QRS Duration: 184 QT Interval:  417 QTC Calculation: 541 R Axis:   -75 Text Interpretation:  Sinus tachycardia Probable left atrial enlargement  Right bundle branch block LVH with IVCD and secondary repol abnrm  Prolonged QT interval Baseline wander in lead(s) V3 V5 Left anterior  fasicular block When compared with ECG of 06/06/2013, No significant change  was found Confirmed by [facility]  MD, Juvencio Verdi ([id number]) on 09/12/2013 1:15:49 AM        Delora Fuel, MD 91/47/82 [id number]

## 2013-09-12 NOTE — Progress Notes (Signed)
ANTIBIOTIC CONSULT NOTE - INITIAL  Pharmacy Consult for Ceftriaxone  Indication: UTI  Allergies  Allergen Reactions  . Other Nausea And Vomiting    Patient stated drug is "kerein"    Patient Measurements: Height: 6' (182.9 cm) Weight: 199 lb 1.6 oz (90.311 kg) IBW/kg (Calculated) : 77.6   Vital Signs: Temp: 97.6 F (36.4 C) (07/13 0924) Temp src: Oral (07/13 0924) BP: 95/60 mmHg (07/13 0924) Pulse Rate: 77 (07/13 0924) Intake/Output from previous day: 07/12 0701 - 07/13 0700 In: 1000 [I.V.:1000] Out: -  Intake/Output from this shift: Total I/O In: 220 [P.O.:220] Out: 300 [Urine:300]  Labs:  Recent Labs  09/12/13 0030 09/12/13 0500 09/12/13 0900  WBC 12.2* 12.3*  --   HGB 10.6* 9.0*  --   PLT 148* 162  --   LABCREA  --   --  162.47  CREATININE 2.40* 2.51*  --    Estimated Creatinine Clearance: 33.5 ml/min (by C-G formula based on Cr of 2.51). No results found for this basename: VANCOTROUGH, VANCOPEAK, VANCORANDOM, GENTTROUGH, GENTPEAK, GENTRANDOM, TOBRATROUGH, TOBRAPEAK, TOBRARND, AMIKACINPEAK, AMIKACINTROU, AMIKACIN,  in the last 72 hours   Microbiology: No results found for this or any previous visit (from the past 720 hour(s)).  Medical History: Past Medical History  Diagnosis Date  . Diabetes mellitus without complication   . CHF (congestive heart failure)   . Hypertension   . Cardiomyopathy, dilated   . Small bowel obstruction 10/2012  . COPD (chronic obstructive pulmonary disease)   . Herpes     BUTT AND BACK- 9/23 HEALED  . Atrial flutter by electrocardiogram 11/2012  . GERD (gastroesophageal reflux disease)   . Gout     Medications:  Scheduled:  . sodium chloride   Intravenous STAT  . allopurinol  100 mg Oral Daily  . amiodarone  200 mg Oral Daily  . aspirin EC  81 mg Oral Daily  . budesonide (PULMICORT) nebulizer solution  0.25 mg Nebulization BID  . carvedilol  3.125 mg Oral BID WC  . [START ON 09/13/2013] colchicine  0.6 mg Oral Daily   . colchicine  1.2 mg Oral Once  . insulin aspart  0-9 Units Subcutaneous TID WC  . isosorbide dinitrate  10 mg Oral TID  . methylPREDNISolone (SOLU-MEDROL) injection  40 mg Intravenous Daily  . sodium chloride  3 mL Intravenous Q12H   Assessment: 63 yo male admitted on 7/13 for gouty arthritis with some concern for infectious causes. Pharmacy consulted to dose ceftriaxone for UTI. Patient is currently afebrile, WBC slightly elevated (12.3).  Urine cx is pending.  Patient is acute on chronic renal failure, however ceftriaxone is not renally adjusted.  Pharmacy will plan to dose and sign off on this patient.   Goal of Therapy:  Appropriate use of antibiotics  Plan:  - Ceftriaxone 1gm q24 - f/u urine cx  - Pharmacy to sign off because no renal adjustments necessary for this patient  Kenta Laster L. Nicole Kindred, PharmD Clinical Pharmacy Resident Pager: [phone] 09/12/2013 11:28 AM

## 2013-09-12 NOTE — Progress Notes (Signed)
Patient seen and examiined. See earlier H&P note by admitting physician.  63 year old gentleman admitted for acute gouty arthritis on solumedrol and colchicine renally dosed. Pain is getting better. X rays of the knee ordered and pending.  Continue to monitor.   Hosie Poisson, MD [phone]

## 2013-09-12 NOTE — H&P (Addendum)
[facility] History and Physical  [name]:[id number] DOB: [birth year] DOA: 09/11/2013  Referring physician: ER physician. PCP: Laverda Page, MD   Chief Complaint: Left hand and knee pain.  HPI: Caleb Bauer is a 63 y.o. male with history of gout admitted in May of this year for acute gouty arthritis presents to the ER because of increasing pain of the left hand and left knee making it difficult for patient to ambulate over the last 2-3 days. Patient states that he has been taking his medications regularly except for last 3 days because he was not able to ambulate because of severe pain. Patient's left hand started swelling first followed by left knee patient also has mild pain in the right knee. Patient denies any fever chills or trauma and feels that pain is typical of his gout attack. He is unable to move his extremities due to pain particularly on the left side. CT head did not show anything acute. Patient was given prednisone 60 mg by mouth in the ER admitted for further management. Patient states that last 2 days he was unable to ambulate and last evening when his daughter called him and he was not taking the phone call they had come into his house and was brought to the ER. Patient states that he did not eat much last 2 days. In addition to the pain labs show that patient has acute on chronic renal failure with metabolic acidosis thrombocytopenia.  Review of Systems: As presented in the history of presenting illness, rest negative.  Past Medical History  Diagnosis Date  . Diabetes mellitus without complication   . CHF (congestive heart failure)   . Hypertension   . Cardiomyopathy, dilated   . Small bowel obstruction 10/2012  . COPD (chronic obstructive pulmonary disease)   . Herpes     BUTT AND BACK- 9/23 HEALED  . Atrial flutter by electrocardiogram 11/2012  . GERD (gastroesophageal reflux disease)   . Gout    Past Surgical History  Procedure Laterality Date   . Laparoscopic incisional / umbilical / ventral hernia repair     Social History:  reports that he quit smoking about a year ago. His smoking use included Cigarettes. He has a .125 pack-year smoking history. He has never used smokeless tobacco. He reports that he does not drink alcohol or use illicit drugs. Where does patient live home. Can patient participate in ADLs? Not sure.  Allergies  Allergen Reactions  . Other Nausea And Vomiting    Patient stated drug is "kerein"    Family History:  Family History  Problem Relation Age of Onset  . Cancer Neg Hx       Prior to Admission medications   Medication Sig Start Date End Date Taking? Authorizing Provider  allopurinol (ZYLOPRIM) 100 MG tablet Take 1 tablet (100 mg total) by mouth daily. 07/04/13  Yes Larey Dresser, MD  amiodarone (PACERONE) 200 MG tablet Take 1 tablet (200 mg total) by mouth daily. 06/06/13  Yes Jolaine Artist, MD  aspirin EC 81 MG tablet Take 81 mg by mouth daily.   Yes Historical Provider, MD  carvedilol (COREG) 3.125 MG tablet Take 1 tablet (3.125 mg total) by mouth 2 (two) times daily. 06/06/13  Yes Jolaine Artist, MD  digoxin (LANOXIN) 0.125 MG tablet Take 1 tablet (0.125 mg total) by mouth every other day. 07/04/13  Yes Larey Dresser, MD  isosorbide dinitrate (ISORDIL) 10 MG tablet Take 10 mg by mouth 3 (  three) times daily.   Yes Historical Provider, MD  lisinopril (PRINIVIL,ZESTRIL) 2.5 MG tablet Take 2.5 mg by mouth daily.   Yes Historical Provider, MD  spironolactone (ALDACTONE) 25 MG tablet Take 25 mg by mouth daily.   Yes Historical Provider, MD  torsemide (DEMADEX) 20 MG tablet Take 1 tablet (20 mg total) by mouth 2 (two) times a week. Every Monday and Friday, Hold for wt less than 193 lb 06/13/13  Yes Jolaine Artist, MD    Physical Exam: Filed Vitals:   09/12/13 0045 09/12/13 0157 09/12/13 0200 09/12/13 0230  BP: 94/57 91/58 104/65 102/55  Pulse:  108 100 91  Temp:      TempSrc:      Resp:  23 17 19 18   Height:      Weight:      SpO2:  95% 97% 96%     General:  Well-developed well-nourished.  Eyes: Anicteric no pallor.  ENT: No discharge from the ears eyes nose mouth.  Neck: No mass felt.  Cardiovascular: S1-S2 heard.  Respiratory: Mild expiratory wheezes no crepitations.  Abdomen: Soft nontender bowel sounds present. No guarding rigidity.  Skin: No rash.  Musculoskeletal: Swelling of the left hand particularly his 4 fingers and patient is unable to make a fist. There is swelling of his left knee with difficulty to flex. There is mild pain on flexing right knee.  Psychiatric: Appears normal.  Neurologic: Alert awake oriented to time place and person. Difficulty in moving left upper and lower extremities due to pain.  Labs on Admission:  Basic Metabolic Panel:  Recent Labs Lab 09/12/13 0030  NA 141  K 4.8  CL 104  CO2 16*  GLUCOSE 132*  BUN 62*  CREATININE 2.40*  CALCIUM 9.6   Liver Function Tests: No results found for this basename: AST, ALT, ALKPHOS, BILITOT, PROT, ALBUMIN,  in the last 168 hours No results found for this basename: LIPASE, AMYLASE,  in the last 168 hours No results found for this basename: AMMONIA,  in the last 168 hours CBC:  Recent Labs Lab 09/12/13 0030  WBC 12.2*  NEUTROABS 10.1*  HGB 10.6*  HCT 33.1*  MCV 79.6  PLT 148*   Cardiac Enzymes:  Recent Labs Lab 09/12/13 0030  CKTOTAL 833*    BNP (last 3 results)  Recent Labs  03/23/13 1255 04/07/13 0223 07/04/13 1403  PROBNP >5000.0* 30408.0* 7985.0*   CBG:  Recent Labs Lab 09/12/13 0118  GLUCAP 135*    Radiological Exams on Admission: Dg Chest 2 View  09/11/2013   CLINICAL DATA:  Gout and shortness of breath  EXAM: CHEST  2 VIEW  COMPARISON:  04/07/2013  FINDINGS: Chronic cardiopericardial enlargement. Upper mediastinal contours are stable from prior. The retrocardiac lung and lateral left diaphragm is poorly visualized on a chronic basis, favoring  chronic changes from the cardiomegaly. There is no evidence of pneumonia in the lateral projection. No edema, effusion, or pneumothorax.  IMPRESSION: Stable exam.  No evidence of acute cardiopulmonary disease.   Electronically Signed   By: Jorje Guild M.D.   On: 09/11/2013 23:56   Ct Head Wo Contrast  09/12/2013   CLINICAL DATA:  Difficulty moving the left upper extremity  EXAM: CT HEAD WITHOUT CONTRAST  TECHNIQUE: Contiguous axial images were obtained from the base of the skull through the vertex without intravenous contrast.  COMPARISON:  03/09/2013  FINDINGS: Skull and Sinuses:Negative for fracture or destructive process. The mastoids, middle ears, and imaged paranasal sinuses are clear.  Orbits: No acute abnormality.  Brain: No evidence of acute abnormality, such as acute infarction, hemorrhage, hydrocephalus, or mass lesion/mass effect. 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 Generalized cerebral volume loss which is stable from priors.  IMPRESSION: No evidence of acute intracranial disease.   Electronically Signed   By: Jorje Guild M.D.   On: 09/12/2013 03:16    EKG: Independently reviewed. Sinus tachycardia with RBBB.  Assessment/Plan Active Problems:   COPD (chronic obstructive pulmonary disease)   Chronic systolic heart failure   Acute gouty arthritis   Renal failure (ARF), acute on chronic   1. Acute gouty arthritis involving multiple joints - patient has received prednisone one dose and I have placed patient on Solu-Medrol 40 mg daily IV. If patient's symptoms does not improve or worsens may have to consider other differentials include infectious causes. Patient's sedimentation rate is significantly elevated and also has uric acid levels. Patient is on allopurinol. Check blood cultures and Procalcitonin. 2. Acute on chronic renal failure with rhabdomyolysis- check urinalysis and FeNa. Patient's blood pressure was low normal. We will hold patient's ACE  inhibitor and diuretics for now. Patient did receive 1 L normal saline bolus in the ER. Since patient has chronic systolic heart failure we will hold off any further IV fluids and we will plan further is based on patient's response. Follow CK levels. 3. Metabolic acidosis - cause not clear. May be secondary to renal failure. Check lactic acid levels and salicylate levels. Recheck metabolic panel after hydration. 4. Chronic systolic heart failure - see #2 with regarding to holding diuretics 5. Thrombocytopenia - since patient has renal failure we will check LDH and peripheral smear to make sure there is no hemolytic process going on. Patient is afebrile. 6. COPD - patient is mildly wheezing. I have placed patient on Pulmicort and nebulizers. 7. Diabetes mellitus - patient is on no home medications for diabetes as per the medication list. For now I have placed patient on CBG coverage with sliding-scale. 8. Chronic anemia - follow CBC. 9. Patient has difficulty moving left upper and lower extremity which I assume is due to #1. CT head was negative for anything acute. May consider MRI brain and difficulty persist despite gout getting better.    Code Status: Full code.  Family Communication: None.  Disposition Plan: Admit to inpatient.    Mykel Sponaugle N. [facility] Pager [phone].  If 7PM-7AM, please contact night-coverage [email or web address] Password [facility] 09/12/2013, 3:34 AM

## 2013-09-12 NOTE — ED Notes (Signed)
CBG is 135. Nurse informed.

## 2013-09-13 LAB — CBC
HCT: 26.4 % — ABNORMAL LOW (ref 39.0–52.0)
HEMOGLOBIN: 8.3 g/dL — AB (ref 13.0–17.0)
MCH: 25.3 pg — AB (ref 26.0–34.0)
MCHC: 31.4 g/dL (ref 30.0–36.0)
MCV: 80.5 fL (ref 78.0–100.0)
PLATELETS: 166 10*3/uL (ref 150–400)
RBC: 3.28 MIL/uL — ABNORMAL LOW (ref 4.22–5.81)
RDW: 17.8 % — ABNORMAL HIGH (ref 11.5–15.5)
WBC: 9 10*3/uL (ref 4.0–10.5)

## 2013-09-13 LAB — BASIC METABOLIC PANEL
Anion gap: 20 — ABNORMAL HIGH (ref 5–15)
BUN: 83 mg/dL — AB (ref 6–23)
CO2: 14 meq/L — AB (ref 19–32)
Calcium: 9 mg/dL (ref 8.4–10.5)
Chloride: 102 mEq/L (ref 96–112)
Creatinine, Ser: 2.11 mg/dL — ABNORMAL HIGH (ref 0.50–1.35)
GFR calc Af Amer: 37 mL/min — ABNORMAL LOW (ref >90)
GFR, EST NON AFRICAN AMERICAN: 32 mL/min — AB (ref >90)
Glucose, Bld: 167 mg/dL — ABNORMAL HIGH (ref 70–99)
Potassium: 5.1 mEq/L (ref 3.7–5.3)
Sodium: 136 mEq/L — ABNORMAL LOW (ref 137–147)

## 2013-09-13 LAB — GLUCOSE, CAPILLARY
GLUCOSE-CAPILLARY: 168 mg/dL — AB (ref 70–99)
Glucose-Capillary: 168 mg/dL — ABNORMAL HIGH (ref 70–99)
Glucose-Capillary: 154 mg/dL — ABNORMAL HIGH (ref 70–99)
Glucose-Capillary: 183 mg/dL — ABNORMAL HIGH (ref 70–99)
Glucose-Capillary: 154 mg/dL — ABNORMAL HIGH (ref 70–99)

## 2013-09-13 MED ORDER — SODIUM BICARBONATE 650 MG PO TABS
650.0000 mg | ORAL_TABLET | Freq: Three times a day (TID) | ORAL | Status: DC
  Administered 2013-09-13 – 2013-09-15 (×5): 650 mg via ORAL
  Filled 2013-09-13 (×7): qty 1

## 2013-09-13 MED ORDER — PREDNISONE 20 MG PO TABS
40.0000 mg | ORAL_TABLET | Freq: Every day | ORAL | Status: DC
Start: 2013-09-14 — End: 2013-09-15
  Administered 2013-09-14 – 2013-09-15 (×2): 40 mg via ORAL
  Filled 2013-09-13 (×3): qty 2

## 2013-09-13 MED ORDER — VANCOMYCIN HCL 10 G IV SOLR
1250.0000 mg | INTRAVENOUS | Status: DC
  Administered 2013-09-13: 1250 mg via INTRAVENOUS
  Filled 2013-09-13 (×3): qty 1250

## 2013-09-13 NOTE — Progress Notes (Signed)
ANTIBIOTIC CONSULT NOTE - INITIAL  Pharmacy Consult for Vancomycin  Indication: Bacteremia, GPC in clusters  Allergies  Allergen Reactions  . Other Nausea And Vomiting    Patient stated drug is "kerein"    Patient Measurements: Height: 6' (182.9 cm) Weight: 208 lb 9.6 oz (94.62 kg) IBW/kg (Calculated) : 77.6  Vital Signs: Temp: 97.6 F (36.4 C) (07/13 1739) Temp src: Oral (07/13 1739) BP: 102/57 mmHg (07/13 1945) Pulse Rate: 67 (07/13 1945)  Labs:  Recent Labs  09/12/13 0030 09/12/13 0500 09/12/13 0900  WBC 12.2* 12.3*  --   HGB 10.6* 9.0*  --   PLT 148* 162  --   LABCREA  --   --  162.47  CREATININE 2.40* 2.51*  --    Estimated Creatinine Clearance: 36.4 ml/min (by C-G formula based on Cr of 2.51).  Microbiology: Recent Results (from the past 720 hour(s))  CULTURE, BLOOD (ROUTINE X 2)     Status: None   Collection Time    09/12/13  6:35 AM      Result Value Ref Range Status   Specimen Description BLOOD LEFT ANTECUBITAL   Final   Special Requests BOTTLES DRAWN AEROBIC ONLY 3CC   Final   Culture  Setup Time     Final   Value: 09/12/2013 11:19     Performed at [facility]   Culture     Final   Value: GRAM POSITIVE COCCI IN CLUSTERS     Note: Gram Stain Report Called to,Read Back By and Verified With: JANEL TEMPRINA AT 3:50 A.M. ON 09/13/13 WARRB     Performed at [facility]   Report Status PENDING   Incomplete    Medical History: Past Medical History  Diagnosis Date  . Diabetes mellitus without complication   . CHF (congestive heart failure)   . Hypertension   . Cardiomyopathy, dilated   . Small bowel obstruction 10/2012  . COPD (chronic obstructive pulmonary disease)   . Herpes     BUTT AND BACK- 9/23 HEALED  . Atrial flutter by electrocardiogram 11/2012  . GERD (gastroesophageal reflux disease)   . Gout     Assessment: 63 y/o M already on ceftriaxone for presumed UTI, now with positive blood cultures, adding vancomycin. WBC 12.2,  noted SCr 2.51 with CrCl 30-40 mL/min, other labs as above, other labs as above.   Goal of Therapy:  Vancomycin trough level 15-20 mcg/ml  Plan:  -Vancomycin 1250 mg IV q24h -Trend WBC, temp, renal function  -Drug levels as indicated -F/U culture for speciation/sensi  Caleb Bauer 09/13/2013,4:17 AM

## 2013-09-13 NOTE — Progress Notes (Addendum)
Lab notified that pt's aerobic blood cultures grew gram positive cocci in clusters. MD made aware. New orders placed. Velora Mediate

## 2013-09-13 NOTE — Progress Notes (Signed)
Lab notified that pt's aerobic sample grew gram positive cocci in clusters. MD made aware.  Velora Mediate
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
## 2013-09-14 ENCOUNTER — Inpatient Hospital Stay (HOSPITAL_COMMUNITY): Payer: Medicare Other

## 2013-09-14 DIAGNOSIS — E119 Type 2 diabetes mellitus without complications: Secondary | ICD-10-CM

## 2013-09-14 DIAGNOSIS — I5022 Chronic systolic (congestive) heart failure: Secondary | ICD-10-CM

## 2013-09-14 LAB — BASIC METABOLIC PANEL
Anion gap: 18 — ABNORMAL HIGH (ref 5–15)
BUN: 83 mg/dL — AB (ref 6–23)
CHLORIDE: 103 meq/L (ref 96–112)
CO2: 16 meq/L — AB (ref 19–32)
CREATININE: 1.89 mg/dL — AB (ref 0.50–1.35)
Calcium: 9.1 mg/dL (ref 8.4–10.5)
GFR calc Af Amer: 42 mL/min — ABNORMAL LOW (ref >90)
GFR calc non Af Amer: 36 mL/min — ABNORMAL LOW (ref >90)
GLUCOSE: 160 mg/dL — AB (ref 70–99)
POTASSIUM: 4.9 meq/L (ref 3.7–5.3)
Sodium: 137 mEq/L (ref 137–147)

## 2013-09-14 LAB — BLOOD GAS, ARTERIAL
ACID-BASE DEFICIT: 8 mmol/L — AB (ref 0.0–2.0)
Bicarbonate: 16.7 mEq/L — ABNORMAL LOW (ref 20.0–24.0)
Drawn by: 22563
FIO2: 0.21 %
O2 SAT: 96.3 %
PO2 ART: 88.3 mmHg (ref 80.0–100.0)
Patient temperature: 98.6
TCO2: 17.7 mmol/L (ref 0–100)
pCO2 arterial: 32.2 mmHg — ABNORMAL LOW (ref 35.0–45.0)
pH, Arterial: 7.334 — ABNORMAL LOW (ref 7.350–7.450)

## 2013-09-14 LAB — GLUCOSE, CAPILLARY
GLUCOSE-CAPILLARY: 170 mg/dL — AB (ref 70–99)
GLUCOSE-CAPILLARY: 178 mg/dL — AB (ref 70–99)
Glucose-Capillary: 110 mg/dL — ABNORMAL HIGH (ref 70–99)
Glucose-Capillary: 172 mg/dL — ABNORMAL HIGH (ref 70–99)

## 2013-09-14 LAB — CULTURE, BLOOD (ROUTINE X 2)

## 2013-09-14 LAB — PROCALCITONIN

## 2013-09-14 NOTE — Progress Notes (Signed)
Occupational Therapy Evaluation and Discharge Patient Details Name: MOUHAMADOU GITTLEMAN MRN: [id number] DOB: 21-May-1950 Today's Date: 09/14/2013    History of Present Illness ERICKSON YAMASHIRO is a 63 y.o. male with history of gout admitted in May of this year for acute gouty arthritis presents to the ER because of increasing pain of the left hand and left knee making it difficult for patient to ambulate over the last 2-3 days. Patient states that he has been taking his medications regularly except for last 3 days because he was not able to ambulate because of severe pain. Patient's left hand started swelling first followed by left knee patient also has mild pain in the right knee. He reports that his pain is similar to that of gouty attacks. He was started on prednisone and colchicine and his pain is much better. His X Rays of the left knee shows degenerative arthritis and minimal effusion. One of his blood cultures showed gram positive cocci and he was empirically started on vancomycin.     Clinical Impression   PTA pt lived at home in an assisted living apartment and was independent with the use of RW or [facility] for ADLs and functional mobility. Pt reports his niece assists with groceries. Pt overall at min guard level for ADLs and functional mobility. Educated pt on fall prevention techniques and discussed strategies to improve medication management skills. Pt reports he has no difficulty with his medication management and states that he only stopped taking his medication when he was unable to ambulate to reach them. Educated pt on keeping phone on him and to call for help if he finds himself unable to stand. Pt has no further acute OT needs.     Follow Up Recommendations  Home Health OT;Supervision/Assistance - 24 hour    Equipment Recommendations  None recommended by OT       Precautions / Restrictions Restrictions Weight Bearing Restrictions: No      Mobility Bed Mobility Overal bed mobility:  Modified Independent Bed Mobility: Supine to Sit     Supine to sit: Supervision     General bed mobility comments: Used bed rails; cues for technique  Transfers Overall transfer level: Needs assistance Equipment used: Rolling walker (2 wheeled) Transfers: Sit to/from Stand Sit to Stand: Min guard         General transfer comment: Min guard for safety. Pt required no physical assistance to power up or stabilize RW.    Balance Overall balance assessment: No apparent balance deficits (not formally assessed)                                          ADL Overall ADL's : Needs assistance/impaired Eating/Feeding: Independent;Sitting   Grooming: Min guard;Standing   Upper Body Bathing: Set up;Sitting   Lower Body Bathing: Min guard;Sit to/from stand   Upper Body Dressing : Set up;Sitting   Lower Body Dressing: Min guard;Sit to/from stand   Toilet Transfer: Min guard;Ambulation;RW   Toileting- Water quality scientist and Hygiene: Min guard;Sit to/from stand   Tub/ Shower Transfer: Walk-in shower;Min guard;Ambulation;Rolling walker   Functional mobility during ADLs: Min guard;Rolling walker General ADL Comments: Pt moving well now that pain is decreasing in LLE and LUE.      Vision  Pt reports no change from baseline.   No apparent visual deficits.  Perception Perception Perception Tested?: No   Praxis Praxis Praxis tested?: Within functional limits    Pertinent Vitals/Pain NAD     Hand Dominance Right   Extremity/Trunk Assessment Upper Extremity Assessment Upper Extremity Assessment: LUE deficits/detail LUE Deficits / Details: pt presents with some limited ROM due to swelling and joint pain throughout LUE, although pt reports it is "not swollen." Specifically, swollen around MCPs, wrist, and elbow. Pt reports pain with passive ROM of shoulder flexion. Pt also tends to leave LUE internally rotated and extended behind body,  resting on bed.  LUE: Unable to fully assess due to pain LUE Coordination: decreased gross motor   Lower Extremity Assessment Lower Extremity Assessment: Generalized weakness   Cervical / Trunk Assessment Cervical / Trunk Assessment: Normal (normal in standing; pt tends to sit with Rt lateral lean )   Communication Communication Communication: No difficulties   Cognition Arousal/Alertness: Awake/alert Behavior During Therapy: WFL for tasks assessed/performed Overall Cognitive Status: Within Functional Limits for tasks assessed                                Home Living Family/patient expects to be discharged to:: Assisted living                             Home Equipment: Walker - 2 wheels;Cane - single point;Bedside commode   Additional Comments: Niece assists pt with groceries. Pt takes bus for transportation as needed      Prior Functioning/Environment Level of Independence: Independent with assistive device(s)        Comments: Used RW inside, cane outside                              End of Session Equipment Utilized During Treatment: Gait belt;Rolling walker Nurse Communication: Mobility status  Activity Tolerance: Patient tolerated treatment well Patient left: in bed;with call bell/phone within reach;with bed alarm set   Time: 5329-9242 OT Time Calculation (min): 27 min Charges:  OT General Charges $OT Visit: 1 Procedure OT Evaluation $Initial OT Evaluation Tier I: 1 Procedure OT Treatments $Self Care/Home Management : 23-37 mins  Juluis Rainier [phone] 09/14/2013, 2:57 PM

## 2013-09-14 NOTE — Evaluation (Addendum)
Physical Therapy Evaluation Patient Details Name: Caleb Bauer MRN: [id number] DOB: 1950-11-08 Today's Date: 09/14/2013   History of Present Illness  Caleb Bauer is a 63 y.o. male with history of gout admitted in May of this year for acute gouty arthritis presents to the ER because of increasing pain of the left hand and left knee making it difficult for patient to ambulate over the last 2-3 days. Patient states that he has been taking his medications regularly except for last 3 days because he was not able to ambulate because of severe pain. Patient's left hand started swelling first followed by left knee patient also has mild pain in the right knee. He reports that his pain is similar to that of gouty attacks. He was started on prednisone and colchicine and his pain is much better. His X Rays of the left knee shows degenerative arthritis and minimal effusion. One of his blood cultures showed gram positive cocci and he was empirically started on vancomycin.    Clinical Impression   Pt admitted with above. Pt currently with functional limitations due to the deficits listed below (see PT Problem List).  Pt will benefit from skilled PT to increase their independence and safety with mobility to allow discharge to the venue listed below.       Follow Up Recommendations Home health PT;Other (comment) (Consider [facility]; [place] for chronic disease Mgmnt)    Equipment Recommendations  Rolling walker with 5" wheels;3in1 (PT)    Recommendations for Other Services OT consult     Precautions / Restrictions        Mobility  Bed Mobility Overal bed mobility: Needs Assistance Bed Mobility: Supine to Sit     Supine to sit: Supervision     General bed mobility comments: Used bed rails; cues for technique  Transfers Overall transfer level: Needs assistance Equipment used: Rolling walker (2 wheeled) Transfers: Sit to/from Stand Sit to Stand: Min assist         General transfer comment:  min assist to rise  Ambulation/Gait Ambulation/Gait assistance: Min guard Ambulation Distance (Feet): 18 Feet Assistive device: Rolling walker (2 wheeled) Gait Pattern/deviations: Step-through pattern;Decreased stance time - right     General Gait Details: Cues for sequence and to unweigh painful RLE by pushing down into RW; trunk flexed  Stairs            Wheelchair Mobility    Modified Rankin (Stroke Patients Only)       Balance Overall balance assessment: No apparent balance deficits (not formally assessed)                                           Pertinent Vitals/Pain R knee 8-9/10 with [place] RN notified patient repositioned for comfort     Home Living Family/patient expects to be discharged to:: Assisted living               Home Equipment: Walker - 2 wheels;Cane - single point;Bedside commode Additional Comments: Neice helps with groceries, takes bus for transportation    Prior Function Level of Independence: Independent with assistive device(s)         Comments: Used RW inside, cane outside     Hand Dominance   Dominant Hand: Right    Extremity/Trunk Assessment   Upper Extremity Assessment: Overall WFL for tasks assessed  Lower Extremity Assessment: Generalized weakness         Communication   Communication: No difficulties  Cognition Arousal/Alertness: Awake/alert Behavior During Therapy: WFL for tasks assessed/performed Overall Cognitive Status: Within Functional Limits for tasks assessed                      General Comments      Exercises        Assessment/Plan    PT Assessment Patient needs continued PT services  PT Diagnosis Difficulty walking;Acute pain   PT Problem List Decreased strength;Decreased range of motion;Decreased activity tolerance;Decreased balance;Decreased mobility;Pain  PT Treatment Interventions DME instruction;Gait training;Functional mobility  training;Therapeutic activities;Therapeutic exercise;Balance training;Patient/family education   PT Goals (Current goals can be found in the Care Plan section) Acute Rehab PT Goals Patient Stated Goal: less gout pain PT Goal Formulation: With patient Time For Goal Achievement: 09/21/13 Potential to Achieve Goals: Good    Frequency Min 3X/week   Barriers to discharge   must be modified independent to Bauer home    Co-evaluation               End of Session Equipment Utilized During Treatment: Gait belt Activity Tolerance: Patient tolerated treatment well Patient left: in chair;with nursing/sitter in room (at sink preparing to wash up) Nurse Communication: Mobility status         Time: 7371-0626 PT Time Calculation (min): 20 min   Charges:   PT Evaluation $Initial PT Evaluation Tier I: 1 Procedure PT Treatments $Gait Training: 8-22 mins   PT G Codes:          Quin Hoop 09/14/2013, 1:11 PM Roney Marion, [place] Pager [phone] Office [phone]

## 2013-09-14 NOTE — Progress Notes (Addendum)
Caleb Bauer [facility]:[id number] DOB: [birth year] DOA: 09/11/2013 PCP: does not have PCP- cardiologist- Laverda Page, MD   Interim summary: Caleb Bauer is a 63 y.o. male with history of gout admitted in May of this year for acute gouty arthritis presents to the ER because of increasing pain of the left hand and left knee making it difficult for patient to ambulate over the last 2-3 days. Patient states that he has been taking his medications regularly except for last 3 days because he was not able to ambulate because of severe pain. Patient's left hand started swelling first followed by left knee patient also has mild pain in the right knee. He reports that his pain is similar to that of gouty attacks. He was started on solumedrol and colchicine with noted improvement.   He was admitted in May for similar complaints of gouty arthritis.   HPI/Subjective: Able to walk in hall today- no further pain  Assessment/Plan: Gouty arthritis along with osteoarthritis: - started on solumedrol 40 IV daily and colchicine. Switched to Prednisone this AM - His X Rays of the left knee shows degenerative arthritis and minimal effusion.  - per PT,  needs HHPT  - have ordered -Pain improving. Uric acid at 10.3 - left hand still swollen- obtain xray - he has been on allopurinol at home   UTI: - on rocephin and urine cultures reveal no growth so far- cont Rocephin for now  Positive blood culture-  Coag neg staph in one set in aerobic bottle - likely contaminant- d/c vancomycin.   Diabetes Mellitus: - SSI. HGBA1C is 6.2. Diet controlled.   Hypertension - on Coreg and Imdur-  Cont to follow  AKI on Stage 3 CKD with mild metabolic acidosis - his baseline creatnine is between 1.5 to 2.  - likely prerenal as he had not been eating at home and is on Spironolactone and Demedex - Renal parameters improved with fluids- diuretics still on hold -  Started on bicarbonate  tablets but still remains acidotic- cont     Chronic atrial flutter/ fibrillation: On amiodarone. Rate controlled.    Chronic systolic CHF with EF of 66-29% and grade 2 diastolic dysfuntion - has been hydrated for prerenal ARF- diuretics on hold   DVT prophylaxis: start Heparin Code Status: full code Family Communication: none at bedside, discussed the plan of care with the patient.  Disposition Plan: home with HHPT, OT   Consultants:  none  Procedures:  CT head.   Antibiotics:  rocpehin 7/13>>  Vancomycin 7/14    Objective: Filed Vitals:   09/14/13 0900  BP: 157/89  Pulse: 62  Temp: 97.8 F (36.6 C)  Resp: 19    Intake/Output Summary (Last 24 hours) at 09/14/13 1403 Last data filed at 09/14/13 0900  Gross per 24 hour  Intake    240 ml  Output   2275 ml  Net  -2035 ml   Filed Weights   09/12/13 0408 09/12/13 1945 09/13/13 2020  Weight: 90.311 kg (199 lb 1.6 oz) 94.62 kg (208 lb 9.6 oz) 94.666 kg (208 lb 11.2 oz)    Exam:   General:  Alert afebrile, in good spirits.  Cardiovascular: s1s2  Respiratory: good air entry bilateral , no wheezing heard.   Abdomen: soft NT ND SB+  Musculoskeletal:  Left wrist swollen  Data Reviewed: Basic Metabolic Panel:  Recent Labs Lab 09/12/13 0030 09/12/13 0500 09/13/13 1334 09/14/13 0350  NA 141 139 136* 137  K 4.8 5.2 5.1 4.9  CL 104 105 102 103  CO2 16* 16* 14* 16*  GLUCOSE 132* 146* 167* 160*  BUN 62* 64* 83* 83*  CREATININE 2.40* 2.51* 2.11* 1.89*  CALCIUM 9.6 8.9 9.0 9.1   Liver Function Tests:  Recent Labs Lab 09/12/13 0312 09/12/13 0500  AST 26 25  ALT 10 11  ALKPHOS 82 88  BILITOT 0.6 0.6  PROT 7.8 8.0  ALBUMIN 2.5* 2.5*   No results found for this basename: LIPASE, AMYLASE,  in the last 168 hours No results found for this basename: AMMONIA,  in the last 168 hours CBC:  Recent Labs Lab 09/12/13 0030 09/12/13 0500 09/13/13 1334  WBC 12.2* 12.3* 9.0  NEUTROABS 10.1* 10.9*   --   HGB 10.6* 9.0* 8.3*  HCT 33.1* 28.9* 26.4*  MCV 79.6 80.3 80.5  PLT 148* 162 166   Cardiac Enzymes:  Recent Labs Lab 09/12/13 0030 09/12/13 0312 09/12/13 0500  CKTOTAL 833*  --  706*  TROPONINI  --  <0.30  --    BNP (last 3 results)  Recent Labs  03/23/13 1255 04/07/13 0223 07/04/13 1403  PROBNP >5000.0* 30408.0* 7985.0*   CBG:  Recent Labs Lab 09/13/13 1147 09/13/13 1740 09/13/13 2024 09/14/13 0805 09/14/13 1133  GLUCAP 183* 154* 168* 110* 170*    Recent Results (from the past 240 hour(s))  CULTURE, BLOOD (ROUTINE X 2)     Status: None   Collection Time    09/12/13  6:30 AM      Result Value Ref Range Status   Specimen Description BLOOD RIGHT ANTECUBITAL   Final   Special Requests BOTTLES DRAWN AEROBIC ONLY 3CC   Final   Culture  Setup Time     Final   Value: 09/12/2013 11:20     Performed at [facility]   Culture     Final   Value:        BLOOD CULTURE RECEIVED NO GROWTH TO DATE CULTURE WILL BE HELD FOR 5 DAYS BEFORE ISSUING A FINAL NEGATIVE REPORT     Performed at [facility]   Report Status PENDING   Incomplete  CULTURE, BLOOD (ROUTINE X 2)     Status: None   Collection Time    09/12/13  6:35 AM      Result Value Ref Range Status   Specimen Description BLOOD LEFT ANTECUBITAL   Final   Special Requests BOTTLES DRAWN AEROBIC ONLY 3CC   Final   Culture  Setup Time     Final   Value: 09/12/2013 11:19     Performed at [facility]   Culture     Final   Value: STAPHYLOCOCCUS SPECIES (COAGULASE NEGATIVE)     Note: THE SIGNIFICANCE OF ISOLATING THIS ORGANISM FROM A SINGLE SET OF BLOOD CULTURES WHEN MULTIPLE SETS ARE DRAWN IS UNCERTAIN. PLEASE NOTIFY THE MICROBIOLOGY DEPARTMENT WITHIN ONE WEEK IF SPECIATION AND SENSITIVITIES ARE REQUIRED.     Note: Gram Stain Report Called to,Read Back By and Verified With: JANEL TEMPRINA AT 3:50 A.M. ON 09/13/13 WARRB     Performed at [facility]   Report Status 09/14/2013 FINAL    Final  URINE CULTURE     Status: None   Collection Time    09/12/13  9:00 AM      Result Value Ref Range Status   Specimen Description URINE, RANDOM   Final   Special Requests NONE   Final   Culture  Setup Time  Final   Value: 09/12/2013 18:14     Performed at [facility] PENDING   Incomplete   Culture     Final   Value: Culture reincubated for better growth     Performed at [facility]   Report Status PENDING   Incomplete     Studies: No results found.  Scheduled Meds: . allopurinol  100 mg Oral Daily  . amiodarone  200 mg Oral Daily  . aspirin EC  81 mg Oral Daily  . budesonide (PULMICORT) nebulizer solution  0.25 mg Nebulization BID  . carvedilol  3.125 mg Oral BID WC  . cefTRIAXone (ROCEPHIN)  IV  1 g Intravenous Q24H  . colchicine  0.6 mg Oral Daily  . insulin aspart  0-9 Units Subcutaneous TID WC  . isosorbide dinitrate  10 mg Oral TID  . predniSONE  40 mg Oral QAC breakfast  . sodium bicarbonate  650 mg Oral TID  . sodium chloride  3 mL Intravenous Q12H   Continuous Infusions:   Active Problems:   COPD (chronic obstructive pulmonary disease)   Chronic systolic heart failure   Acute gouty arthritis   Renal failure (ARF), acute on chronic    Time spent: 30 minutes.    [facility]  [facility] Pager [phone] If 7PM-7AM, please contact night-coverage at [email or web address], password [facility] 09/14/2013, 2:03 PM  LOS: 3 days

## 2013-09-15 DIAGNOSIS — N390 Urinary tract infection, site not specified: Secondary | ICD-10-CM | POA: Diagnosis present

## 2013-09-15 DIAGNOSIS — I4892 Unspecified atrial flutter: Secondary | ICD-10-CM

## 2013-09-15 DIAGNOSIS — N179 Acute kidney failure, unspecified: Secondary | ICD-10-CM

## 2013-09-15 HISTORY — DX: Urinary tract infection, site not specified: N39.0

## 2013-09-15 LAB — BASIC METABOLIC PANEL
Anion gap: 15 (ref 5–15)
BUN: 70 mg/dL — ABNORMAL HIGH (ref 6–23)
CO2: 19 mEq/L (ref 19–32)
Calcium: 8.9 mg/dL (ref 8.4–10.5)
Chloride: 104 mEq/L (ref 96–112)
Creatinine, Ser: 1.55 mg/dL — ABNORMAL HIGH (ref 0.50–1.35)
GFR calc Af Amer: 54 mL/min — ABNORMAL LOW (ref >90)
GFR calc non Af Amer: 46 mL/min — ABNORMAL LOW (ref >90)
Glucose, Bld: 125 mg/dL — ABNORMAL HIGH (ref 70–99)
Potassium: 4.8 mEq/L (ref 3.7–5.3)
Sodium: 138 mEq/L (ref 137–147)

## 2013-09-15 LAB — GLUCOSE, CAPILLARY
GLUCOSE-CAPILLARY: 138 mg/dL — AB (ref 70–99)
Glucose-Capillary: 106 mg/dL — ABNORMAL HIGH (ref 70–99)

## 2013-09-15 MED ORDER — PREDNISONE 20 MG PO TABS: 20.0000 mg | ORAL_TABLET | Freq: Every day | ORAL | Status: AC

## 2013-09-15 MED ORDER — CIPROFLOXACIN HCL 500 MG PO TABS: 500.0000 mg | ORAL_TABLET | Freq: Two times a day (BID) | ORAL | Status: DC

## 2013-09-15 NOTE — Care Management Note (Signed)
CARE MANAGEMENT NOTE 09/15/2013  Patient:  Caleb Bauer, Caleb Bauer   Account Number:  [id number]  Date Initiated:  09/15/2013  Documentation initiated by:  Jasmine Pang  Subjective/Objective Assessment:   Pt for d/c to home, consult for HHPT and [place].     Action/Plan:   09/15/2013 Met with pt re HH needs, wishes to use [facility], pt interested in [facility], however needs to recertify Medicaid. Niece will assist with this when pt is d/c from hospital.   Anticipated DC Date:  09/15/2013   Anticipated DC Plan:  [place]         [facility] Choice  HOME HEALTH   Choice offered to / List presented to:  C-1 Patient        [place] arranged  [place].   Status of service:  Completed, signed off Medicare Important Message given?  YES (If response is "NO", the following Medicare IM given date fields will be blank) Date Medicare IM given:  09/15/2013 Medicare IM given by:  Robel Wuertz Date Additional Medicare IM given:   Additional Medicare IM given by:    Discharge Disposition:  [place]  Per UR Regulation:    If discussed at Long Length of Stay Meetings, dates discussed:    Comments:  09/15/2013 Met with pt and discussed HH needs, pt has used [facility] in the past and wishes to use them again for HHPT and HHOT. [facility] notifed and will begin services 48-72 hr post d/c. Pt has DME, cane, walker, wheelchair and tub seat. Pt niece will help pt recertify his Medicaid and set up PCP. Personal Care Services explained to pt and that he can ask PCP to assist with application for this assistance once a PCP is established. Jasmine Pang RN MPH, case manager, [phone]

## 2013-09-15 NOTE — Progress Notes (Signed)
Discharge documentation reviewed with pt; allowing time for questions. Pt verbalized understanding. IV removed without issue. Prescriptions given to pt. Pt to leave unit via wheelchair accompanied by volunteer.
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
## 2013-09-16 LAB — URINE CULTURE: Colony Count: >=100000

## 2013-09-18 LAB — CULTURE, BLOOD (ROUTINE X 2): CULTURE: NO GROWTH

## 2013-09-19 DIAGNOSIS — Z5189 Encounter for other specified aftercare: Secondary | ICD-10-CM

## 2013-09-19 DIAGNOSIS — M129 Arthropathy, unspecified: Secondary | ICD-10-CM

## 2013-09-19 DIAGNOSIS — E119 Type 2 diabetes mellitus without complications: Secondary | ICD-10-CM

## 2013-09-19 DIAGNOSIS — M109 Gout, unspecified: Secondary | ICD-10-CM

## 2013-12-13 IMAGING — CR DG ABD PORTABLE 2V
4 series · 4 of 4 positions shown · non-contrast
Comparison: None.

CLINICAL DATA: Distended abdomen, vomiting.

PORTABLE ABDOMEN - 2 VIEW

[AP (1 of 2)]
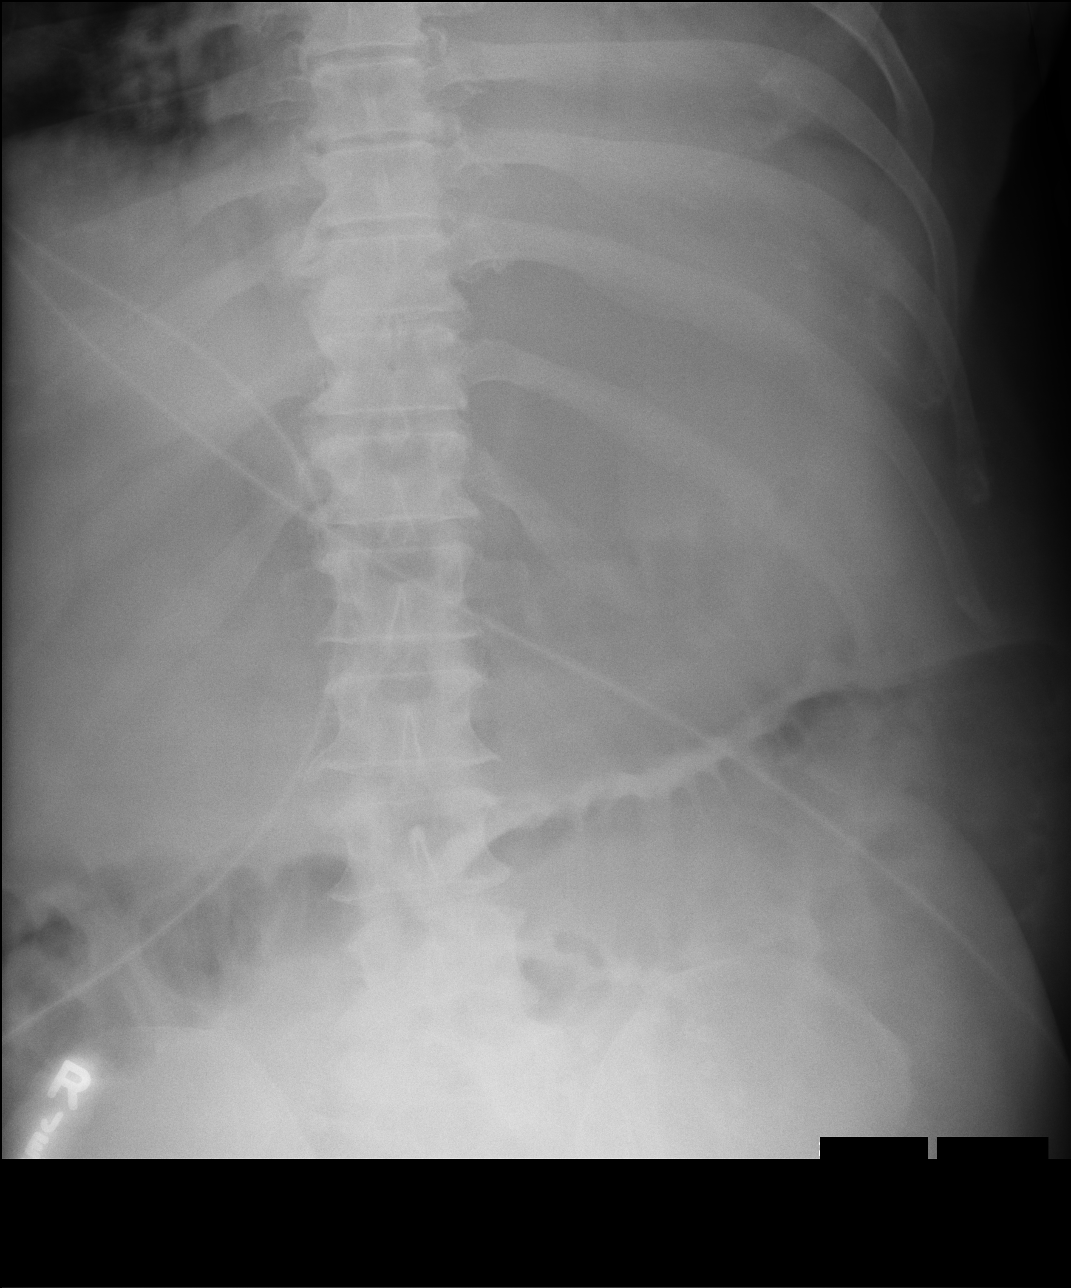

[ap lld]
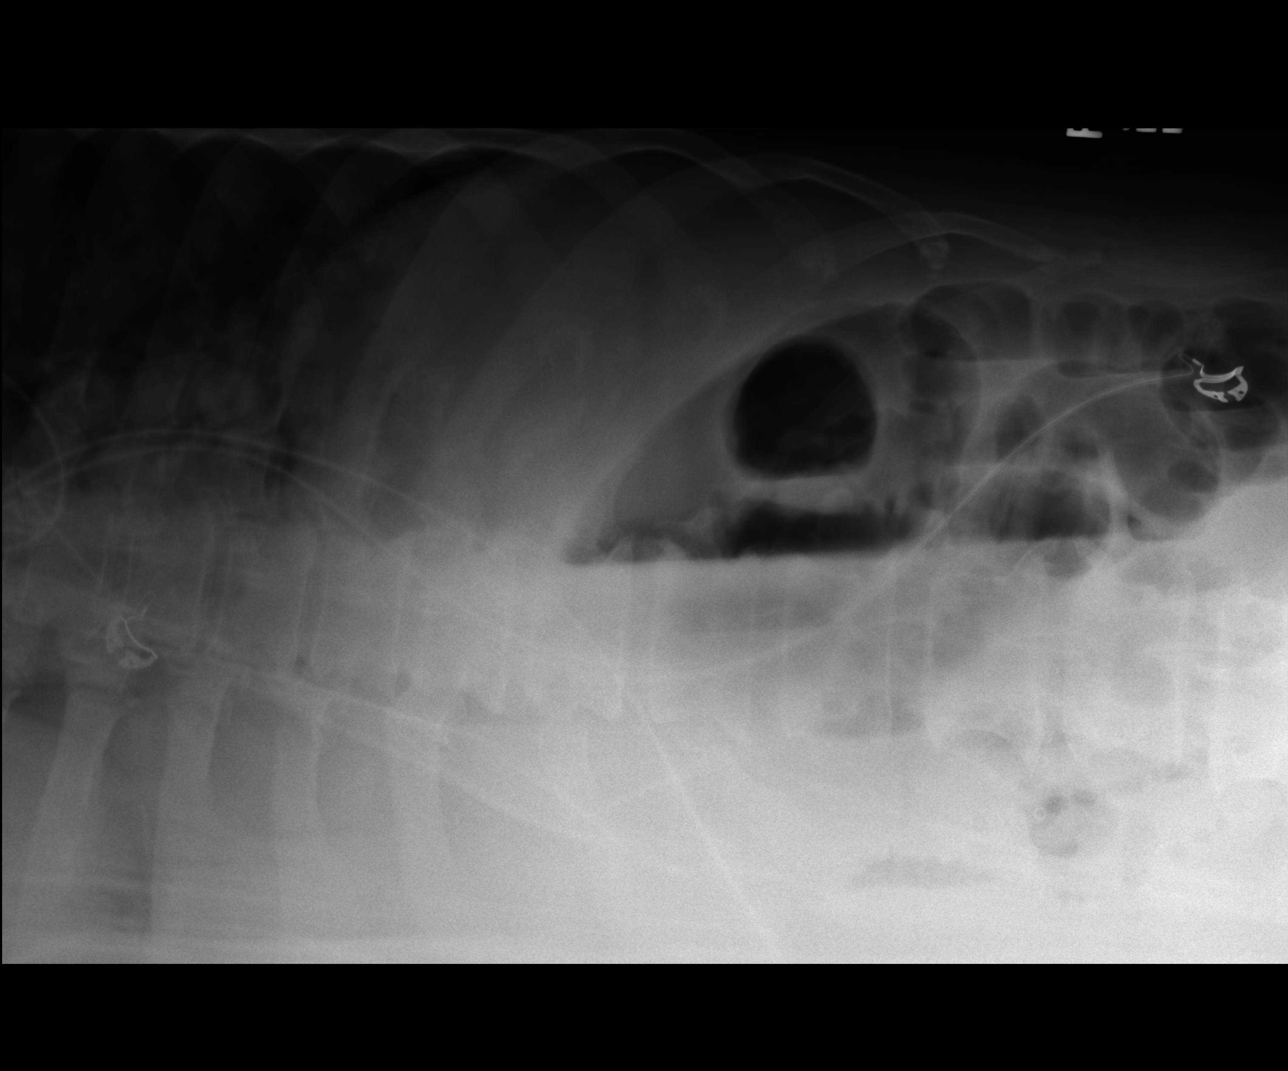

[pa lld]
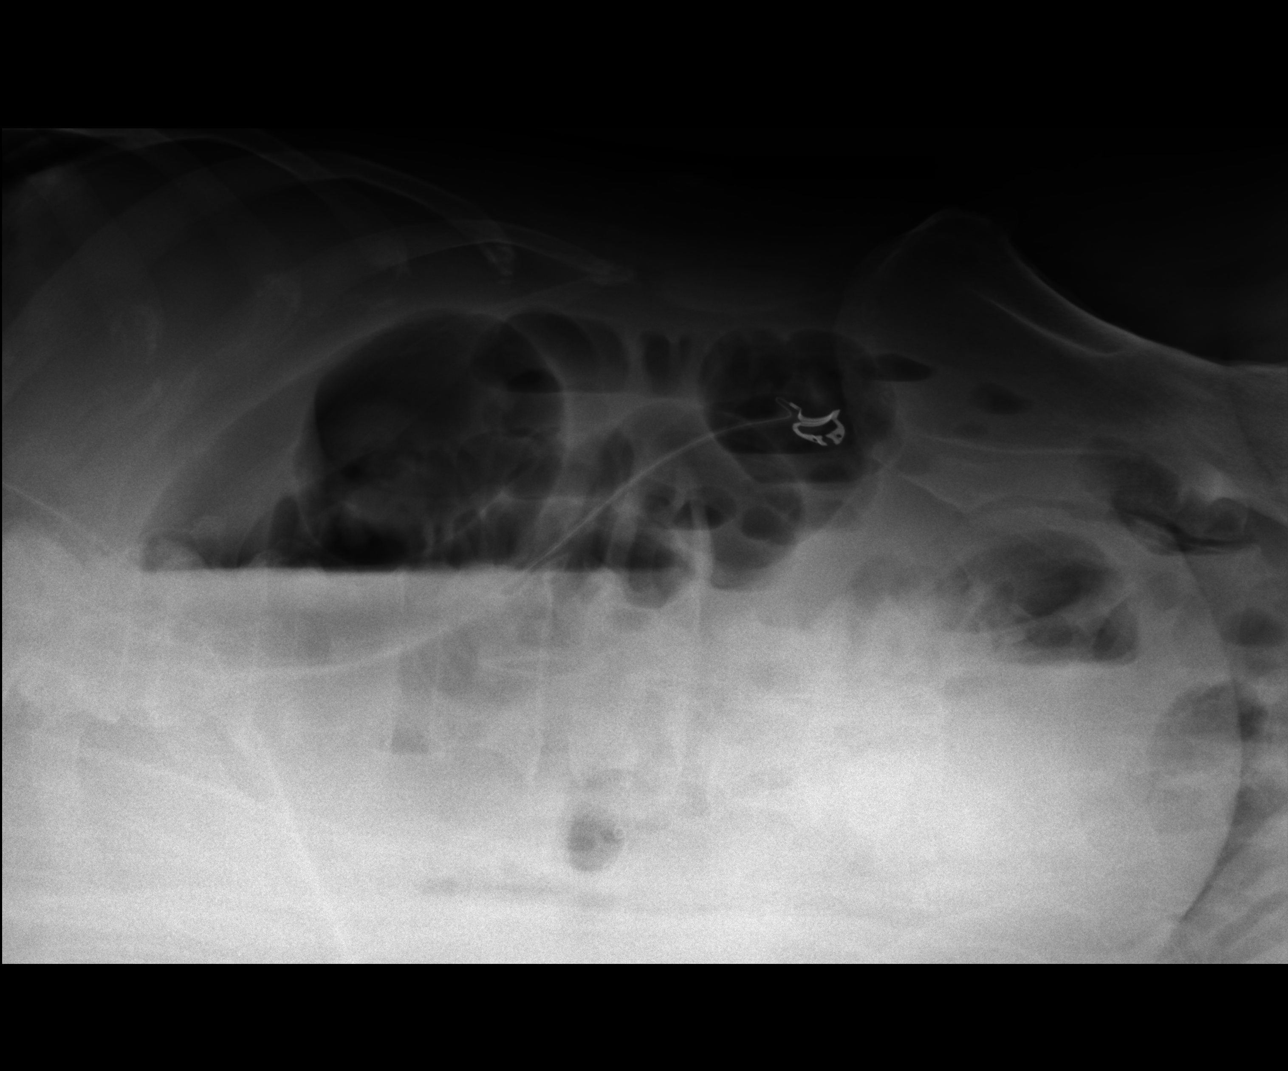

[AP (2 of 2)]
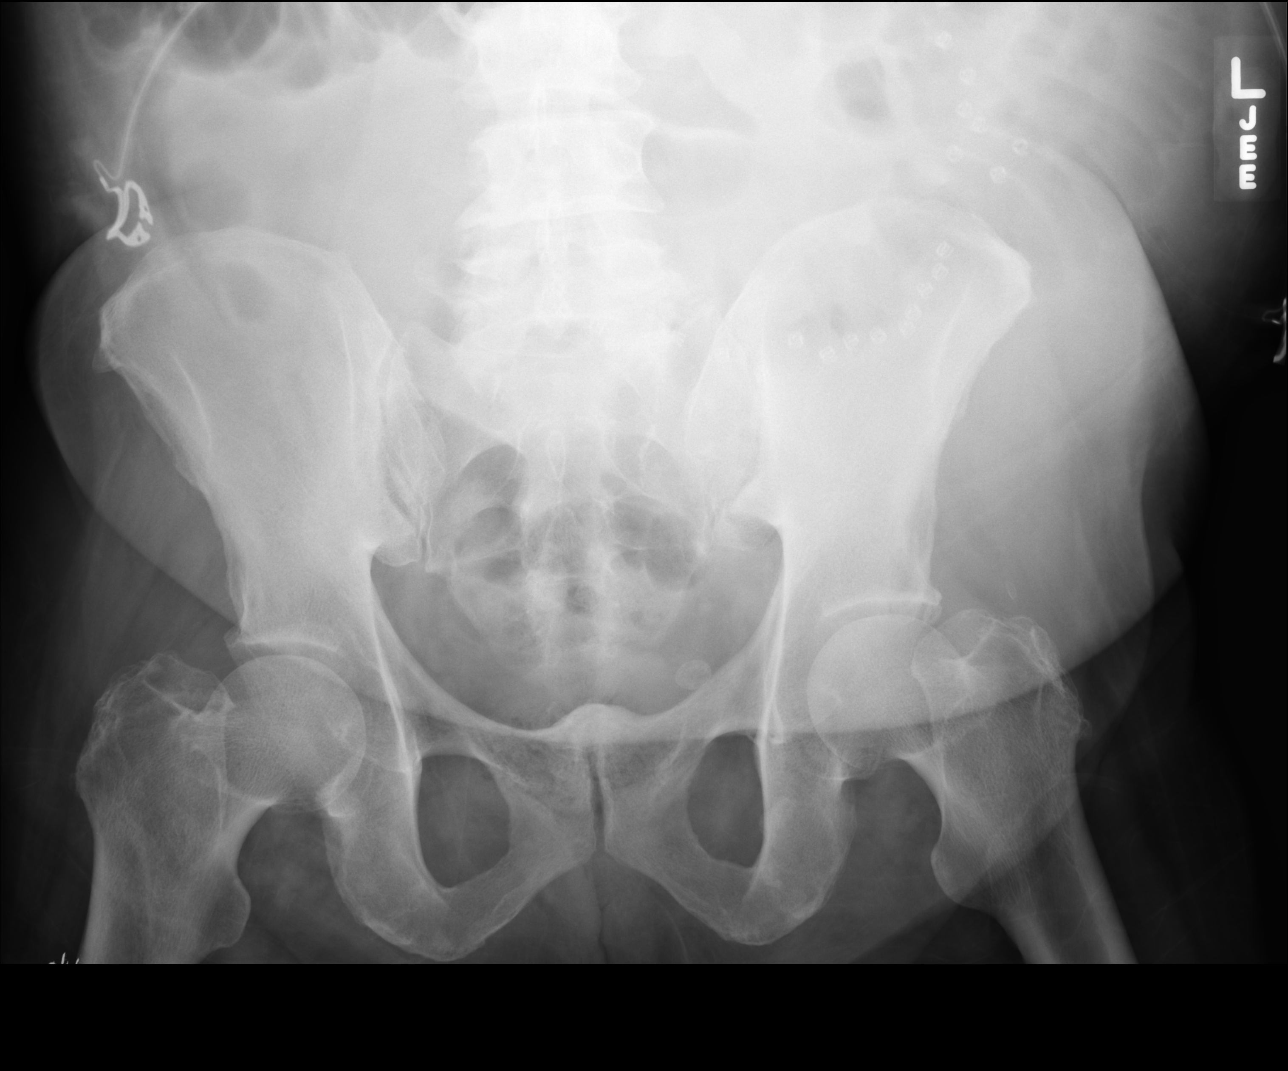

[4 of 4 positions shown; findings below may reference images not displayed]

FINDINGS: Dilated stomach and bowel loops throughout the abdomen
and pelvis.  This appears to most likely be predominately small
bowel loops concerning for small bowel obstruction.  No free air.
No organomegaly or suspicious calcification.
IMPRESSION: Dilated bowel loops in the abdomen, concerning for small bowel
dilatation and small bowel obstruction.

## 2013-12-16 IMAGING — CR DG ABDOMEN 1V
1 series · 1 of 1 positions shown · non-contrast
Comparison: 10/08/2012

CLINICAL DATA: Small bowel obstruction

ABDOMEN - 1 VIEW

[t abdomen supine]
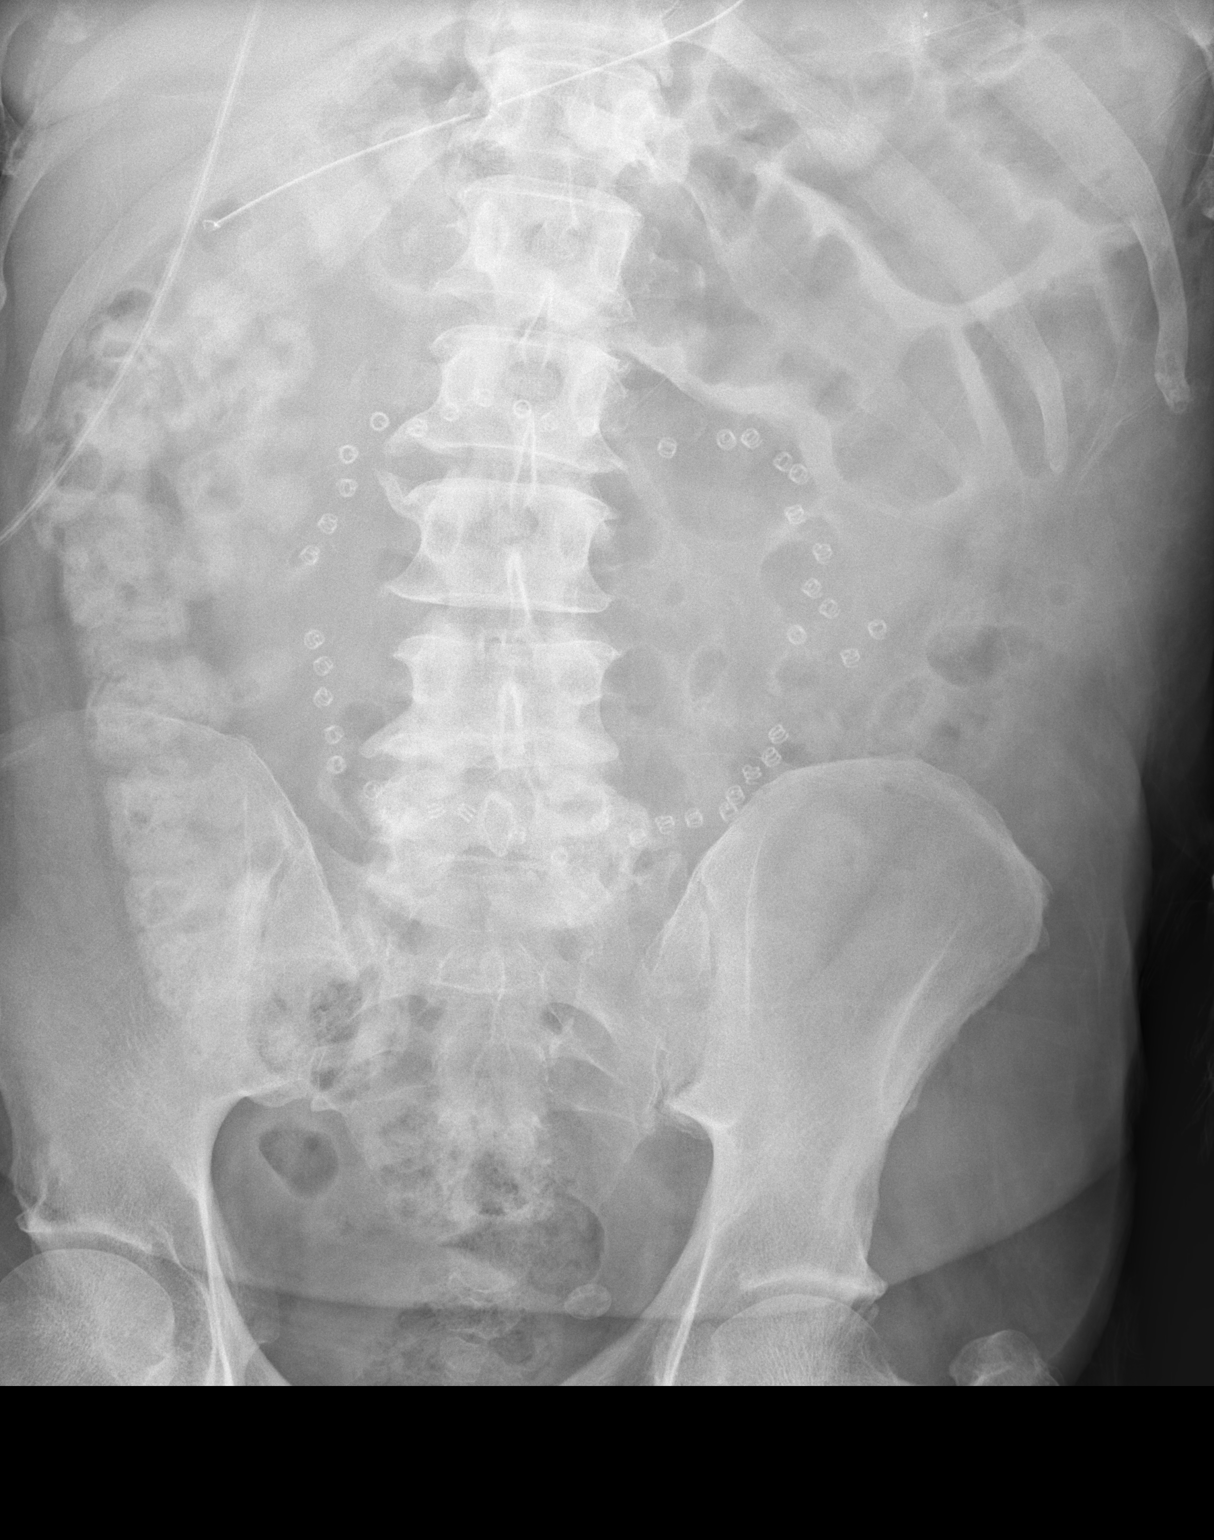

[1 of 1 positions shown; findings below may reference images not displayed]

FINDINGS: NG tube in the gastric antrum/proximal duodenum.
Interval decompression of the small bowel since the prior study.
Small bowel  is decompressed.  There is contrast in the colon which
is not dilated.  Prior ventral hernia repair.
IMPRESSION: Interval decompression of the small bowel following NG placement.

## 2013-12-21 DIAGNOSIS — M1A00X0 Idiopathic chronic gout, unspecified site, without tophus (tophi): Secondary | ICD-10-CM | POA: Insufficient documentation

## 2013-12-21 DIAGNOSIS — M1A9XX0 Chronic gout, unspecified, without tophus (tophi): Secondary | ICD-10-CM | POA: Insufficient documentation

## 2014-02-07 ENCOUNTER — Other Ambulatory Visit (HOSPITAL_COMMUNITY): Payer: Self-pay | Admitting: Cardiology

## 2014-03-10 ENCOUNTER — Encounter (HOSPITAL_COMMUNITY): Payer: Self-pay

## 2014-03-10 ENCOUNTER — Inpatient Hospital Stay (HOSPITAL_COMMUNITY)
Admission: EM | Admit: 2014-03-10 | Discharge: 2014-03-16 | DRG: 291 | Disposition: A | Discharge status: Home / Self Care | Payer: Medicare Other | Attending: Internal Medicine | Admitting: Internal Medicine

## 2014-03-10 ENCOUNTER — Emergency Department (HOSPITAL_COMMUNITY): Payer: Medicare Other

## 2014-03-10 DIAGNOSIS — N189 Chronic kidney disease, unspecified: Secondary | ICD-10-CM

## 2014-03-10 DIAGNOSIS — E1165 Type 2 diabetes mellitus with hyperglycemia: Secondary | ICD-10-CM | POA: Diagnosis present

## 2014-03-10 DIAGNOSIS — M109 Gout, unspecified: Secondary | ICD-10-CM | POA: Diagnosis present

## 2014-03-10 DIAGNOSIS — Z7982 Long term (current) use of aspirin: Secondary | ICD-10-CM

## 2014-03-10 DIAGNOSIS — K219 Gastro-esophageal reflux disease without esophagitis: Secondary | ICD-10-CM | POA: Diagnosis present

## 2014-03-10 DIAGNOSIS — I420 Dilated cardiomyopathy: Secondary | ICD-10-CM | POA: Diagnosis present

## 2014-03-10 DIAGNOSIS — Z87891 Personal history of nicotine dependence: Secondary | ICD-10-CM | POA: Diagnosis not present

## 2014-03-10 DIAGNOSIS — J449 Chronic obstructive pulmonary disease, unspecified: Secondary | ICD-10-CM | POA: Diagnosis present

## 2014-03-10 DIAGNOSIS — N183 Chronic kidney disease, stage 3 (moderate): Secondary | ICD-10-CM | POA: Diagnosis present

## 2014-03-10 DIAGNOSIS — R627 Adult failure to thrive: Secondary | ICD-10-CM | POA: Diagnosis present

## 2014-03-10 DIAGNOSIS — I453 Trifascicular block: Secondary | ICD-10-CM | POA: Diagnosis present

## 2014-03-10 DIAGNOSIS — I4892 Unspecified atrial flutter: Secondary | ICD-10-CM | POA: Diagnosis present

## 2014-03-10 DIAGNOSIS — A047 Enterocolitis due to Clostridium difficile: Secondary | ICD-10-CM | POA: Diagnosis present

## 2014-03-10 DIAGNOSIS — I5043 Acute on chronic combined systolic (congestive) and diastolic (congestive) heart failure: Secondary | ICD-10-CM | POA: Diagnosis present

## 2014-03-10 DIAGNOSIS — N179 Acute kidney failure, unspecified: Secondary | ICD-10-CM | POA: Diagnosis present

## 2014-03-10 DIAGNOSIS — D638 Anemia in other chronic diseases classified elsewhere: Secondary | ICD-10-CM | POA: Diagnosis present

## 2014-03-10 DIAGNOSIS — Z66 Do not resuscitate: Secondary | ICD-10-CM | POA: Diagnosis present

## 2014-03-10 DIAGNOSIS — I472 Ventricular tachycardia: Secondary | ICD-10-CM | POA: Diagnosis not present

## 2014-03-10 DIAGNOSIS — N390 Urinary tract infection, site not specified: Secondary | ICD-10-CM | POA: Diagnosis present

## 2014-03-10 DIAGNOSIS — A0472 Enterocolitis due to Clostridium difficile, not specified as recurrent: Secondary | ICD-10-CM

## 2014-03-10 DIAGNOSIS — Z79899 Other long term (current) drug therapy: Secondary | ICD-10-CM

## 2014-03-10 DIAGNOSIS — F1010 Alcohol abuse, uncomplicated: Secondary | ICD-10-CM | POA: Diagnosis present

## 2014-03-10 DIAGNOSIS — I130 Hypertensive heart and chronic kidney disease with heart failure and stage 1 through stage 4 chronic kidney disease, or unspecified chronic kidney disease: Secondary | ICD-10-CM | POA: Diagnosis not present

## 2014-03-10 DIAGNOSIS — I2781 Cor pulmonale (chronic): Secondary | ICD-10-CM | POA: Diagnosis present

## 2014-03-10 DIAGNOSIS — R609 Edema, unspecified: Secondary | ICD-10-CM

## 2014-03-10 DIAGNOSIS — E43 Unspecified severe protein-calorie malnutrition: Secondary | ICD-10-CM | POA: Diagnosis present

## 2014-03-10 DIAGNOSIS — I471 Supraventricular tachycardia: Secondary | ICD-10-CM | POA: Diagnosis not present

## 2014-03-10 DIAGNOSIS — I1310 Hypertensive heart and chronic kidney disease without heart failure, with stage 1 through stage 4 chronic kidney disease, or unspecified chronic kidney disease: Secondary | ICD-10-CM

## 2014-03-10 DIAGNOSIS — I509 Heart failure, unspecified: Secondary | ICD-10-CM

## 2014-03-10 DIAGNOSIS — N19 Unspecified kidney failure: Secondary | ICD-10-CM

## 2014-03-10 LAB — CBC WITH DIFFERENTIAL/PLATELET
BASOS ABS: 0 10*3/uL (ref 0.0–0.1)
Basophils Relative: 0 % (ref 0–1)
Eosinophils Absolute: 0 10*3/uL (ref 0.0–0.7)
Eosinophils Relative: 0 % (ref 0–5)
HCT: 31.7 % — ABNORMAL LOW (ref 39.0–52.0)
Hemoglobin: 9.6 g/dL — ABNORMAL LOW (ref 13.0–17.0)
LYMPHS ABS: 1.4 10*3/uL (ref 0.7–4.0)
LYMPHS PCT: 11 % — AB (ref 12–46)
MCH: 24.7 pg — ABNORMAL LOW (ref 26.0–34.0)
MCHC: 30.3 g/dL (ref 30.0–36.0)
MCV: 81.5 fL (ref 78.0–100.0)
Monocytes Absolute: 1.3 10*3/uL — ABNORMAL HIGH (ref 0.1–1.0)
Monocytes Relative: 11 % (ref 3–12)
Neutro Abs: 9.3 10*3/uL — ABNORMAL HIGH (ref 1.7–7.7)
Neutrophils Relative %: 78 % — ABNORMAL HIGH (ref 43–77)
PLATELETS: 218 10*3/uL (ref 150–400)
RBC: 3.89 MIL/uL — AB (ref 4.22–5.81)
RDW: 18.5 % — ABNORMAL HIGH (ref 11.5–15.5)
WBC: 12 10*3/uL — AB (ref 4.0–10.5)

## 2014-03-10 LAB — URINALYSIS, ROUTINE W REFLEX MICROSCOPIC
Glucose, UA: NEGATIVE mg/dL
Ketones, ur: 15 mg/dL — AB
NITRITE: POSITIVE — AB
PH: 5 (ref 5.0–8.0)
Specific Gravity, Urine: 1.019 (ref 1.005–1.030)
UROBILINOGEN UA: 1 mg/dL (ref 0.0–1.0)

## 2014-03-10 LAB — BASIC METABOLIC PANEL
Anion gap: 6 (ref 5–15)
BUN: 35 mg/dL — AB (ref 6–23)
CO2: 26 mmol/L (ref 19–32)
CREATININE: 2.03 mg/dL — AB (ref 0.50–1.35)
Calcium: 8.6 mg/dL (ref 8.4–10.5)
Chloride: 111 mEq/L (ref 96–112)
GFR calc Af Amer: 38 mL/min — ABNORMAL LOW (ref >90)
GFR calc non Af Amer: 33 mL/min — ABNORMAL LOW (ref >90)
GLUCOSE: 101 mg/dL — AB (ref 70–99)
POTASSIUM: 4.3 mmol/L (ref 3.5–5.1)
SODIUM: 143 mmol/L (ref 135–145)

## 2014-03-10 LAB — URINE MICROSCOPIC-ADD ON

## 2014-03-10 LAB — BRAIN NATRIURETIC PEPTIDE

## 2014-03-10 MED ORDER — FUROSEMIDE 10 MG/ML IJ SOLN: 40.0000 mg | Freq: Once | INTRAMUSCULAR | Status: DC

## 2014-03-10 MED ORDER — SODIUM CHLORIDE 0.9 % IV SOLN: 250.0000 mL | INTRAVENOUS | Status: DC | PRN

## 2014-03-10 MED ORDER — DIGOXIN 125 MCG PO TABS
0.1250 mg | ORAL_TABLET | ORAL | Status: DC
  Administered 2014-03-11 – 2014-03-15 (×3): 0.125 mg via ORAL
  Filled 2014-03-10 (×3): qty 1

## 2014-03-10 MED ORDER — ISOSORBIDE DINITRATE 10 MG PO TABS
10.0000 mg | ORAL_TABLET | Freq: Three times a day (TID) | ORAL | Status: DC
  Administered 2014-03-10 – 2014-03-16 (×17): 10 mg via ORAL
  Filled 2014-03-10 (×18): qty 1

## 2014-03-10 MED ORDER — ASPIRIN EC 81 MG PO TBEC
81.0000 mg | DELAYED_RELEASE_TABLET | Freq: Every day | ORAL | Status: DC
  Administered 2014-03-11 – 2014-03-16 (×7): 81 mg via ORAL
  Filled 2014-03-10 (×8): qty 1

## 2014-03-10 MED ORDER — SODIUM CHLORIDE 0.9 % IJ SOLN
3.0000 mL | Freq: Two times a day (BID) | INTRAMUSCULAR | Status: DC
  Administered 2014-03-10 – 2014-03-13 (×6): 3 mL via INTRAVENOUS
  Administered 2014-03-13: 10 mL via INTRAVENOUS
  Administered 2014-03-14 – 2014-03-16 (×5): 3 mL via INTRAVENOUS

## 2014-03-10 MED ORDER — ALLOPURINOL 100 MG PO TABS
100.0000 mg | ORAL_TABLET | Freq: Every day | ORAL | Status: DC
  Administered 2014-03-11 – 2014-03-16 (×7): 100 mg via ORAL
  Filled 2014-03-10 (×8): qty 1

## 2014-03-10 MED ORDER — FUROSEMIDE 10 MG/ML IJ SOLN
80.0000 mg | Freq: Two times a day (BID) | INTRAMUSCULAR | Status: DC
  Administered 2014-03-11 – 2014-03-15 (×9): 80 mg via INTRAVENOUS
  Filled 2014-03-10 (×11): qty 8

## 2014-03-10 MED ORDER — FUROSEMIDE 10 MG/ML IJ SOLN
40.0000 mg | Freq: Once | INTRAMUSCULAR | Status: AC
  Administered 2014-03-10: 40 mg via INTRAVENOUS
  Filled 2014-03-10: qty 4

## 2014-03-10 MED ORDER — LISINOPRIL 2.5 MG PO TABS: 2.5000 mg | ORAL_TABLET | Freq: Every day | ORAL | Status: DC

## 2014-03-10 MED ORDER — AMIODARONE HCL 200 MG PO TABS
200.0000 mg | ORAL_TABLET | Freq: Every day | ORAL | Status: DC
  Administered 2014-03-11: 200 mg via ORAL
  Filled 2014-03-10 (×2): qty 1

## 2014-03-10 MED ORDER — ACETAMINOPHEN 325 MG PO TABS: 650.0000 mg | ORAL_TABLET | ORAL | Status: DC | PRN

## 2014-03-10 MED ORDER — ONDANSETRON HCL 4 MG/2ML IJ SOLN: 4.0000 mg | Freq: Four times a day (QID) | INTRAMUSCULAR | Status: DC | PRN

## 2014-03-10 MED ORDER — DEXTROSE 5 % IV SOLN
1.0000 g | INTRAVENOUS | Status: DC
  Administered 2014-03-10 – 2014-03-12 (×3): 1 g via INTRAVENOUS
  Filled 2014-03-10 (×4): qty 10

## 2014-03-10 MED ORDER — DEXTROSE 5 % IV SOLN: 1.0000 g | INTRAVENOUS | Status: DC

## 2014-03-10 MED ORDER — ALLOPURINOL 100 MG PO TABS: 100.0000 mg | ORAL_TABLET | Freq: Every day | ORAL | Status: DC

## 2014-03-10 MED ORDER — SODIUM CHLORIDE 0.9 % IJ SOLN
3.0000 mL | INTRAMUSCULAR | Status: DC | PRN
  Administered 2014-03-15: 3 mL via INTRAVENOUS
  Filled 2014-03-10: qty 3

## 2014-03-10 MED ORDER — SPIRONOLACTONE 25 MG PO TABS
25.0000 mg | ORAL_TABLET | Freq: Every day | ORAL | Status: DC
  Administered 2014-03-11 – 2014-03-16 (×6): 25 mg via ORAL
  Filled 2014-03-10 (×6): qty 1

## 2014-03-10 MED ORDER — CARVEDILOL 3.125 MG PO TABS
3.1250 mg | ORAL_TABLET | Freq: Two times a day (BID) | ORAL | Status: DC
  Administered 2014-03-11 – 2014-03-16 (×11): 3.125 mg via ORAL
  Filled 2014-03-10 (×12): qty 1

## 2014-03-10 MED ORDER — DIGOXIN 125 MCG PO TABS
125.0000 ug | ORAL_TABLET | ORAL | Status: DC
  Filled 2014-03-10: qty 1

## 2014-03-10 NOTE — ED Notes (Signed)
Ordered Cardiac diet tray

## 2014-03-10 NOTE — H&P (Signed)
[facility] History and Physical  Caleb Bauer JME:[id number] DOB: Jul 27, 1950 DOA: 03/10/2014  Referring physician: EDP PCP: Laverda Page, MD   Chief Complaint: Leg pain, edema   HPI: Caleb Bauer is a 64 y.o. male who presents to the ED with BLE leg pain and edema, onset last week, and getting worse over the past 1 week.  There is also RUE edema as well.  This occurs in the context of chronic CHF with known cardio-renal syndrome and baseline creatinine that appears to be elevating over the past year.  Review of Systems: Systems reviewed.  As above, otherwise negative  Past Medical History  Diagnosis Date  . Diabetes mellitus without complication   . CHF (congestive heart failure)   . Hypertension   . Cardiomyopathy, dilated   . Small bowel obstruction 10/2012  . COPD (chronic obstructive pulmonary disease)   . Herpes     BUTT AND BACK- 9/23 HEALED  . Atrial flutter by electrocardiogram 11/2012  . GERD (gastroesophageal reflux disease)   . Gout    Past Surgical History  Procedure Laterality Date  . Laparoscopic incisional / umbilical / ventral hernia repair     Social History:  reports that he quit smoking about 17 months ago. His smoking use included Cigarettes. He has a .125 pack-year smoking history. He has never used smokeless tobacco. He reports that he does not drink alcohol or use illicit drugs.  Allergies  Allergen Reactions  . Other Nausea And Vomiting    Patient stated drug is "kerein"    Family History  Problem Relation Age of Onset  . Cancer Neg Hx      Prior to Admission medications   Medication Sig Start Date End Date Taking? Authorizing Provider  allopurinol (ZYLOPRIM) 100 MG tablet Take 1 tablet (100 mg total) by mouth daily. 07/04/13   Larey Dresser, MD  amiodarone (PACERONE) 200 MG tablet Take 1 tablet (200 mg total) by mouth daily. 06/06/13   Jolaine Artist, MD  aspirin EC 81 MG tablet Take 81 mg by mouth daily.    Historical Provider,  MD  carvedilol (COREG) 3.125 MG tablet Take 1 tablet (3.125 mg total) by mouth 2 (two) times daily. 06/06/13   Jolaine Artist, MD  digoxin (LANOXIN) 0.125 MG tablet TAKE ONE TABLET BY MOUTH EVERY OTHER DAY 02/08/14   Jolaine Artist, MD  isosorbide dinitrate (ISORDIL) 10 MG tablet Take 10 mg by mouth 3 (three) times daily.    Historical Provider, MD  lisinopril (PRINIVIL,ZESTRIL) 2.5 MG tablet Take 2.5 mg by mouth daily.    Historical Provider, MD  spironolactone (ALDACTONE) 25 MG tablet Take 25 mg by mouth daily.    Historical Provider, MD  torsemide (DEMADEX) 20 MG tablet Take 1 tablet (20 mg total) by mouth 2 (two) times a week. Every Monday and Friday, Hold for wt less than 193 lb 06/13/13   Jolaine Artist, MD   Physical Exam: Filed Vitals:   03/10/14 1945  BP: 124/86  Pulse: 97  Temp:   Resp: 26    BP 124/86 mmHg  Pulse 97  Temp(Src) 99.2 F (37.3 C) (Oral)  Resp 26  SpO2 98%  General Appearance:    Alert, oriented, no distress, appears stated age  Head:    Normocephalic, atraumatic  Eyes:    PERRL, EOMI, sclera non-icteric        Nose:   Nares without drainage or epistaxis. Mucosa, turbinates normal  Throat:   Moist  mucous membranes. Oropharynx without erythema or exudate.  Neck:   Supple. No carotid bruits.  No thyromegaly.  No lymphadenopathy.   Back:     No CVA tenderness, no spinal tenderness  Lungs:     Clear to auscultation bilaterally, without wheezes, rhonchi or rales  Chest wall:    No tenderness to palpitation  Heart:    Regular rate and rhythm without murmurs, gallops, rubs  Abdomen:     Soft, non-tender, nondistended, normal bowel sounds, no organomegaly  Genitalia:    deferred  Rectal:    deferred  Extremities:   No clubbing, cyanosis or edema.  Pulses:   2+ and symmetric all extremities  Skin:   Skin color, texture, turgor normal, no rashes or lesions  Lymph nodes:   Cervical, supraclavicular, and axillary nodes normal  Neurologic:   CNII-XII  intact. Normal strength, sensation and reflexes      throughout    Labs on Admission:  Basic Metabolic Panel:  Recent Labs Lab 03/10/14 1722  NA 143  K 4.3  CL 111  CO2 26  GLUCOSE 101*  BUN 35*  CREATININE 2.03*  CALCIUM 8.6   Liver Function Tests: No results for input(s): AST, ALT, ALKPHOS, BILITOT, PROT, ALBUMIN in the last 168 hours. No results for input(s): LIPASE, AMYLASE in the last 168 hours. No results for input(s): AMMONIA in the last 168 hours. CBC:  Recent Labs Lab 03/10/14 1722  WBC 12.0*  NEUTROABS 9.3*  HGB 9.6*  HCT 31.7*  MCV 81.5  PLT 218   Cardiac Enzymes: No results for input(s): CKTOTAL, CKMB, CKMBINDEX, TROPONINI in the last 168 hours.  BNP (last 3 results)  Recent Labs  03/23/13 1255 04/07/13 0223 07/04/13 1403  PROBNP >5000.0* 30408.0* 7985.0*   CBG: No results for input(s): GLUCAP in the last 168 hours.  Radiological Exams on Admission: Dg Chest 2 View  03/10/2014   CLINICAL DATA:  Bilateral feet swelling, cough, fever x1 week  EXAM: CHEST  2 VIEW  COMPARISON:  09/11/2013  FINDINGS: Cardiomegaly with pulmonary vascular congestion and suspected mild interstitial edema.  Left lung base is obscured, favored to reflect a small left pleural effusion with left lower lobe atelectasis. Left lower lobe pneumonia is not excluded.  No pneumothorax.  Degenerative changes of the visualized thoracolumbar spine.  IMPRESSION: Cardiomegaly with mild interstitial edema.  Left lung base is obscured, favored to reflect a small left pleural effusion with left lower lobe atelectasis. Left lower lobe pneumonia is not excluded.   Electronically Signed   By: Julian Hy M.D.   On: 03/10/2014 18:12    EKG: Independently reviewed.  Assessment/Plan Active Problems:   Acute on chronic combined systolic and diastolic congestive heart failure, NYHA class 4   UTI (lower urinary tract infection)   Cardiorenal syndrome with renal failure   Acute-on-chronic  kidney injury   1. Acute on chronic CHF - likely exacerbated by worsening of kidney function, creatinine is 2.0 at time of admission. 1. CHF pathway 2. Tele monitor 3. Lasix 80mg  BID, hold home torsemide 4. 2d echo ordered, but believe this may be more nephrogenic 5. Continue spironolactone 2. AKI on CKD - suspicious that this may represent worsening of CKD baseline in setting of cardiorenal syndrome 1. Hold lisinopril 2. Strict intake and output 3. Repeat BMP in AM 4. B renal ultrasound 5. Likely will need nephrology consultation tomorrow 3. UTI - rocephin 1gm daily, cultures pending.    Code Status: Full Code  Family Communication:  No family in room Disposition Plan: Admit to inpatient   Time spent: 70 min  GARDNER, JARED M. [facility] Pager [phone]  If 7AM-7PM, please contact the day team taking care of the patient [email or web address] Password [facility] 03/10/2014, 8:26 PM

## 2014-03-10 NOTE — ED Notes (Signed)
[facility]- pt coming from assisted living facility, he has bilateral leg pain and edema as well as right arm pain and edema. He has history of both. Pt was able to ambulate on scene with EMS.

## 2014-03-10 NOTE — ED Notes (Signed)
Report attempted, RN to call back in 10 min. 

## 2014-03-10 NOTE — Progress Notes (Signed)
Pt admitted from ED supposed to go to [facility], nurse tech from ED brought pt to [place] [facility]. Report given to Mickel Baas RN in [place]. Pt will be transported via stretcher.

## 2014-03-10 NOTE — ED Notes (Signed)
Pt given urinal asked to provide urine

## 2014-03-10 NOTE — ED Provider Notes (Signed)
CSN: [id number]     Arrival date & time 03/10/14  1636 History   First MD Initiated Contact with Patient 03/10/14 1643     Chief Complaint  Patient presents with  . Leg Pain  . Edema     (Consider location/radiation/quality/duration/timing/severity/associated sxs/prior Treatment) Patient is a 64 y.o. male presenting with leg pain. The history is provided by the patient.  Leg Pain Location:  Leg Time since incident:  1 week Injury: no   Leg location:  L leg and R leg Pain details:    Quality:  Aching   Radiates to:  Does not radiate   Severity:  Moderate   Onset quality:  Gradual   Duration:  7 days   Progression:  Worsening Chronicity:  Recurrent Relieved by:  Nothing Worsened by:  Bearing weight and activity Ineffective treatments:  None tried Associated symptoms: swelling   Associated symptoms: no back pain, no fever, no neck pain and no numbness     Past Medical History  Diagnosis Date  . Diabetes mellitus without complication   . CHF (congestive heart failure)   . Hypertension   . Cardiomyopathy, dilated   . Small bowel obstruction 10/2012  . COPD (chronic obstructive pulmonary disease)   . Herpes     BUTT AND BACK- 9/23 HEALED  . Atrial flutter by electrocardiogram 11/2012  . GERD (gastroesophageal reflux disease)   . Gout    Past Surgical History  Procedure Laterality Date  . Laparoscopic incisional / umbilical / ventral hernia repair     Family History  Problem Relation Age of Onset  . Cancer Neg Hx    History  Substance Use Topics  . Smoking status: Former Smoker -- 0.25 packs/day for .5 years    Types: Cigarettes    Quit date: 09/28/2012  . Smokeless tobacco: Never Used  . Alcohol Use: No    Review of Systems  Constitutional: Negative for fever, diaphoresis, activity change and appetite change.  HENT: Negative for facial swelling, sore throat, tinnitus, trouble swallowing and voice change.   Eyes: Negative for pain, redness and visual  disturbance.  Respiratory: Negative for chest tightness, shortness of breath and wheezing.   Cardiovascular: Positive for leg swelling. Negative for chest pain and palpitations.  Gastrointestinal: Negative for nausea, vomiting, abdominal pain, diarrhea, constipation and abdominal distention.  Endocrine: Negative.   Genitourinary: Negative.  Negative for dysuria, decreased urine volume, scrotal swelling and testicular pain.  Musculoskeletal: Positive for myalgias, arthralgias and gait problem. Negative for back pain, neck pain and neck stiffness.  Skin: Negative.  Negative for rash.  Neurological: Negative.  Negative for dizziness, tremors, weakness and headaches.  Psychiatric/Behavioral: Negative for suicidal ideas, hallucinations and self-injury. The patient is not nervous/anxious.       Allergies  Other  Home Medications   Prior to Admission medications   Medication Sig Start Date End Date Taking? Authorizing Provider  allopurinol (ZYLOPRIM) 100 MG tablet Take 1 tablet (100 mg total) by mouth daily. 07/04/13   Larey Dresser, MD  amiodarone (PACERONE) 200 MG tablet Take 1 tablet (200 mg total) by mouth daily. 06/06/13   Jolaine Artist, MD  aspirin EC 81 MG tablet Take 81 mg by mouth daily.    Historical Provider, MD  carvedilol (COREG) 3.125 MG tablet Take 1 tablet (3.125 mg total) by mouth 2 (two) times daily. 06/06/13   Jolaine Artist, MD  ciprofloxacin (CIPRO) 500 MG tablet Take 1 tablet (500 mg total) by mouth 2 (two)  times daily. 09/15/13   Debbe Odea, MD  digoxin (LANOXIN) 0.125 MG tablet TAKE ONE TABLET BY MOUTH EVERY OTHER DAY 02/08/14   Jolaine Artist, MD  isosorbide dinitrate (ISORDIL) 10 MG tablet Take 10 mg by mouth 3 (three) times daily.    Historical Provider, MD  lisinopril (PRINIVIL,ZESTRIL) 2.5 MG tablet Take 2.5 mg by mouth daily.    Historical Provider, MD  spironolactone (ALDACTONE) 25 MG tablet Take 25 mg by mouth daily.    Historical Provider, MD   torsemide (DEMADEX) 20 MG tablet Take 1 tablet (20 mg total) by mouth 2 (two) times a week. Every Monday and Friday, Hold for wt less than 193 lb 06/13/13   Shaune Pascal Bensimhon, MD   BP 124/86 mmHg  Pulse 97  Temp(Src) 99.2 F (37.3 C) (Oral)  Resp 26  SpO2 98% Physical Exam  Constitutional: He is oriented to person, place, and time. He appears well-developed and well-nourished. No distress.  HENT:  Head: Normocephalic and atraumatic.  Right Ear: External ear normal.  Left Ear: External ear normal.  Nose: Nose normal.  Mouth/Throat: Oropharynx is clear and moist.  Eyes: Conjunctivae and EOM are normal. Pupils are equal, round, and reactive to light. No scleral icterus.  Neck: Normal range of motion. Neck supple. No JVD present. No tracheal deviation present. No thyromegaly present.  Cardiovascular: Intact distal pulses.  Exam reveals no gallop and no friction rub.   No murmur heard. Regular tachycardia  Pulmonary/Chest: Effort normal and breath sounds normal. No stridor. No respiratory distress. He has no wheezes. He has no rales.  Abdominal: Soft. He exhibits no distension. There is no tenderness. There is no rebound and no guarding.  Musculoskeletal: Normal range of motion. He exhibits tenderness (3+ pitting edema equal in bilateral lower extremities. Moderate swelling of right arm comparted to left.). He exhibits no edema.  Neurological: He is alert and oriented to person, place, and time. No cranial nerve deficit. He exhibits normal muscle tone. Coordination normal.  Patient NV intact in all 4 extremities.  Skin: Skin is warm and dry. No rash noted. He is not diaphoretic.  Psychiatric: He has a normal mood and affect. His behavior is normal.  Nursing note and vitals reviewed.   ED Course  Procedures (including critical care time) Labs Review Labs Reviewed  CBC WITH DIFFERENTIAL - Abnormal; Notable for the following:    WBC 12.0 (*)    RBC 3.89 (*)    Hemoglobin 9.6 (*)     HCT 31.7 (*)    MCH 24.7 (*)    RDW 18.5 (*)    Neutrophils Relative % 78 (*)    Neutro Abs 9.3 (*)    Lymphocytes Relative 11 (*)    Monocytes Absolute 1.3 (*)    All other components within normal limits  BASIC METABOLIC PANEL - Abnormal; Notable for the following:    Glucose, Bld 101 (*)    BUN 35 (*)    Creatinine, Ser 2.03 (*)    GFR calc non Af Amer 33 (*)    GFR calc Af Amer 38 (*)    All other components within normal limits  URINALYSIS, ROUTINE W REFLEX MICROSCOPIC - Abnormal; Notable for the following:    Color, Urine RED (*)    APPearance TURBID (*)    Hgb urine dipstick LARGE (*)    Bilirubin Urine MODERATE (*)    Ketones, ur 15 (*)    Protein, ur >300 (*)    Nitrite POSITIVE (*)  Leukocytes, UA MODERATE (*)    All other components within normal limits  BRAIN NATRIURETIC PEPTIDE - Abnormal; Notable for the following:    B Natriuretic Peptide >4500.0 (*)    All other components within normal limits  URINE MICROSCOPIC-ADD ON - Abnormal; Notable for the following:    Bacteria, UA MANY (*)    Casts HYALINE CASTS (*)    All other components within normal limits    Imaging Review Dg Chest 2 View  03/10/2014   CLINICAL DATA:  Bilateral feet swelling, cough, fever x1 week  EXAM: CHEST  2 VIEW  COMPARISON:  09/11/2013  FINDINGS: Cardiomegaly with pulmonary vascular congestion and suspected mild interstitial edema.  Left lung base is obscured, favored to reflect a small left pleural effusion with left lower lobe atelectasis. Left lower lobe pneumonia is not excluded.  No pneumothorax.  Degenerative changes of the visualized thoracolumbar spine.  IMPRESSION: Cardiomegaly with mild interstitial edema.  Left lung base is obscured, favored to reflect a small left pleural effusion with left lower lobe atelectasis. Left lower lobe pneumonia is not excluded.   Electronically Signed   By: Julian Hy M.D.   On: 03/10/2014 18:12     EKG Interpretation None      MDM    Final diagnoses:  Edema    The patient is a 64 y.o. M with medical hx including CHF with EF of 20-25%, and stage III CKD who presents for swelling of bilateral legs and right arm gradually worsening over the past week. The patient denies any shortness of breath or chest pain. Left work significant for urine indicating infection and elevated BNP indicating CHF exacerbation. Creatinine is mildly elevated at 2.03 from patient's baseline of 1.5-2.0 with normal potassium. Patient started on IV Rocephin and IV Lasix and admitted for further management.  Patient seen with attending, Dr. Jeneen Rinks, who oversaw clinical decision making.    Margaretann Loveless, MD 03/10/14 2004  Tanna Furry, MD 03/21/14 [phone]

## 2014-03-11 ENCOUNTER — Inpatient Hospital Stay (HOSPITAL_COMMUNITY): Payer: Medicare Other

## 2014-03-11 LAB — MAGNESIUM: MAGNESIUM: 2 mg/dL (ref 1.5–2.5)

## 2014-03-11 LAB — CLOSTRIDIUM DIFFICILE BY PCR: CDIFFPCR: POSITIVE — AB

## 2014-03-11 LAB — BASIC METABOLIC PANEL
ANION GAP: 7 (ref 5–15)
BUN: 38 mg/dL — AB (ref 6–23)
CHLORIDE: 109 meq/L (ref 96–112)
CO2: 23 mmol/L (ref 19–32)
CREATININE: 2.38 mg/dL — AB (ref 0.50–1.35)
Calcium: 8.1 mg/dL — ABNORMAL LOW (ref 8.4–10.5)
GFR calc Af Amer: 32 mL/min — ABNORMAL LOW (ref >90)
GFR calc non Af Amer: 27 mL/min — ABNORMAL LOW (ref >90)
Glucose, Bld: 100 mg/dL — ABNORMAL HIGH (ref 70–99)
Potassium: 4.3 mmol/L (ref 3.5–5.1)
SODIUM: 139 mmol/L (ref 135–145)

## 2014-03-11 LAB — GLUCOSE, CAPILLARY: GLUCOSE-CAPILLARY: 204 mg/dL — AB (ref 70–99)

## 2014-03-11 MED ORDER — DOBUTAMINE IN D5W 4-5 MG/ML-% IV SOLN
2.5000 ug/kg/min | INTRAVENOUS | Status: DC
  Administered 2014-03-11: 2.52 ug/kg/min via INTRAVENOUS
  Filled 2014-03-11: qty 250

## 2014-03-11 MED ORDER — METRONIDAZOLE 500 MG PO TABS
500.0000 mg | ORAL_TABLET | Freq: Three times a day (TID) | ORAL | Status: DC
  Administered 2014-03-11 – 2014-03-16 (×15): 500 mg via ORAL
  Filled 2014-03-11 (×17): qty 1

## 2014-03-11 MED ORDER — COLCHICINE 0.6 MG PO TABS
0.3000 mg | ORAL_TABLET | Freq: Every day | ORAL | Status: DC
  Administered 2014-03-11 – 2014-03-16 (×6): 0.3 mg via ORAL
  Filled 2014-03-11: qty 0.5
  Filled 2014-03-11 (×5): qty 1

## 2014-03-11 MED ORDER — GUAIFENESIN 100 MG/5ML PO SYRP
200.0000 mg | ORAL_SOLUTION | ORAL | Status: DC | PRN
  Administered 2014-03-11: 200 mg via ORAL
  Filled 2014-03-11 (×2): qty 10

## 2014-03-11 MED ORDER — PREDNISONE 20 MG PO TABS
50.0000 mg | ORAL_TABLET | Freq: Every day | ORAL | Status: DC
  Administered 2014-03-11 – 2014-03-13 (×3): 50 mg via ORAL
  Filled 2014-03-11: qty 2
  Filled 2014-03-11 (×3): qty 1
  Filled 2014-03-11: qty 2
  Filled 2014-03-11 (×2): qty 1

## 2014-03-11 NOTE — Progress Notes (Addendum)
[facility] PROGRESS NOTE  Caleb Bauer [id number]:[id number] DOB: December 12, 1950 DOA: 03/10/2014 PCP: Laverda Page, MD  Assessment/Plan: 1-Acute on chronic combine Heart failure. Last EF 25 % by ECHO 03-10-2013.  -Continue with lasix 80 mg IV BID.  -Continue with Isosorbide (holder parameters for BP), digoxin, coreg.  -Cardiology consulted.  -daily weight, I and o.  -ECHO ordered.   2-Leukocytosis; Continue with ceftriaxone to cover for UTI.   3-Chronic renal failure, stage III;  Cr per records 1.5 to 2.2. Monitor on lasix,   4-UTI; send  urine culture. Continue with ceftriaxone.  5-Gout flare: pain right had with swelling. Will start colchicine and prednisone.  6-Lower extremity edema, worse on the left: will check doppler.  7-Screening for HIV ordered.  8-A flutter; continue with amiodarone.  9-Diarrhea; c diff came back positive. Start flagyl.   Code Status: Full Code.  Family Communication: care discussed with patient,. Disposition Plan: remain inpatient,.    Consultants:  cardiology  Procedures:  Renal US. No evidence of hydronephrosis. Two small left renal cysts seen.  Antibiotics:  Ceftriaxone 1-08  HPI/Subjective: Complaining of right hand pain and swelling similar to prior gout episodes.  Notice worsening bilateral LE edema since last week, worse on the left.  Pain in LE is not similar to gout.  He is breathing better today.    Objective: Filed Vitals:   03/10/14 2257  BP: 105/68  Pulse: 102  Temp: 98.4 F (36.9 C)  Resp: 19    Intake/Output Summary (Last 24 hours) at 03/11/14 0733 Last data filed at 03/11/14 0317  Gross per 24 hour  Intake    480 ml  Output      0 ml  Net    480 ml   Filed Weights   03/10/14 2257  Weight: 111.086 kg (244 lb 14.4 oz)    Exam:   General:  Alert in no distress.   Cardiovascular:S 1, S 2 RRR  Respiratory: Crackles bases, normal respiratory effort.   Abdomen: BS present, distended.    Musculoskeletal: right had with edema, very tender to palpation. Bilateral LE with plus 2 edema, worse on the left.   Data Reviewed: Basic Metabolic Panel:  Recent Labs Lab 03/10/14 1722 03/11/14 0501  NA 143 139  K 4.3 4.3  CL 111 109  CO2 26 23  GLUCOSE 101* 100*  BUN 35* 38*  CREATININE 2.03* 2.38*  CALCIUM 8.6 8.1*   Liver Function Tests: No results for input(s): AST, ALT, ALKPHOS, BILITOT, PROT, ALBUMIN in the last 168 hours. No results for input(s): LIPASE, AMYLASE in the last 168 hours. No results for input(s): AMMONIA in the last 168 hours. CBC:  Recent Labs Lab 03/10/14 1722  WBC 12.0*  NEUTROABS 9.3*  HGB 9.6*  HCT 31.7*  MCV 81.5  PLT 218   Cardiac Enzymes: No results for input(s): CKTOTAL, CKMB, CKMBINDEX, TROPONINI in the last 168 hours. BNP (last 3 results)  Recent Labs  03/23/13 1255 04/07/13 0223 07/04/13 1403  PROBNP >5000.0* 30408.0* 7985.0*   CBG: No results for input(s): GLUCAP in the last 168 hours.  No results found for this or any previous visit (from the past 240 hour(s)).   Studies: Dg Chest 2 View  03/10/2014   CLINICAL DATA:  Bilateral feet swelling, cough, fever x1 week  EXAM: CHEST  2 VIEW  COMPARISON:  09/11/2013  FINDINGS: Cardiomegaly with pulmonary vascular congestion and suspected mild interstitial edema.  Left lung base is obscured, favored to reflect a small left  pleural effusion with left lower lobe atelectasis. Left lower lobe pneumonia is not excluded.  No pneumothorax.  Degenerative changes of the visualized thoracolumbar spine.  IMPRESSION: Cardiomegaly with mild interstitial edema.  Left lung base is obscured, favored to reflect a small left pleural effusion with left lower lobe atelectasis. Left lower lobe pneumonia is not excluded.   Electronically Signed   By: Julian Hy M.D.   On: 03/10/2014 18:12    Scheduled Meds: . allopurinol  100 mg Oral Daily  . amiodarone  200 mg Oral Daily  . aspirin EC  81 mg  Oral Daily  . carvedilol  3.125 mg Oral BID WC  . cefTRIAXone (ROCEPHIN)  IV  1 g Intravenous Q24H  . digoxin  0.125 mg Oral QODAY  . furosemide  80 mg Intravenous BID  . isosorbide dinitrate  10 mg Oral TID  . sodium chloride  3 mL Intravenous Q12H  . spironolactone  25 mg Oral Daily   Continuous Infusions:   Active Problems:   Acute on chronic combined systolic and diastolic congestive heart failure, NYHA class 4   UTI (lower urinary tract infection)   Cardiorenal syndrome with renal failure   Acute-on-chronic kidney injury    Time spent: 35 minutes.     Niel Hummer A  [facility] Pager [phone]. If 7PM-7AM, please contact night-coverage at [email or web address], password [facility] 03/11/2014, 7:33 AM  LOS: 1 day

## 2014-03-11 NOTE — Progress Notes (Signed)
Rash noted on pts left buttock with open abrasions. Pt stated previous history of shingles from a year ago. Foam dressing applied. MD notified. Will continue to monitor.

## 2014-03-11 NOTE — Progress Notes (Signed)
Admission note:  Arrival Method: Pt arrived by stretcher with RN's Mental Orientation: Alert and oriented x 4 Telemetry: Telemetry box 6E01 applied to pt, central tele notified Assessment: See flowsheets for assessment Skin: Rash with open abrasions noted on patients left mid buttock. Foam dressing placed. IV: Left AC IV, flushed, clean dry and intact.  Pain: Stated no pain at this time Tubes: Oxygen tubing secured, SCD's to be applied Safety Measures: Fall risk, bed alarm placed, bed in lowest position Fall Prevention Safety Plan: Reviewed with pt Admission Screening: [place] 6700 Orientation: Patient has been oriented to the unit, staff and to the room.  Will continue to monitor patient  Shelbie Hutching, RN

## 2014-03-11 NOTE — Consult Note (Addendum)
CARDIOLOGY CONSULT NOTE  Patient ID: Caleb Bauer MRN: [id number] DOB/AGE: Mar 30, 1950 64 y.o.  Admit date: 03/10/2014 Referring Physician  Dr. Frederic Jericho Primary Physician:  Laverda Page, MD Reason for Consultation  CHF  HPI: Patient is a 64 year old African-American male with history of dilated cardiomyopathy, which is suspect probably due to alcohol abuse in the past, no history of any other illicit drug use, history of tobacco use disorder and COPD in the past, history of severe recurrent gout and biventricular failure admitted with inability to move his upper extremity and also worsening leg edema. He was also found to have UTI. I was asked to see the patient manages heart failure.  Patient has been noticing worsening cough for the past 1 week to 10 days. He also started noticing worsening leg edema which are previously completely resolved. He has had acute gouty arthritis and states that over the past 1 week he is unable to move his right of approximately 2 to severe pain. He was admitted via emergency room. He denies any chest pain, palpitations. No dark stools or bloody stools.  Past Medical History  Diagnosis Date  . Diabetes mellitus without complication   . CHF (congestive heart failure)   . Hypertension   . Cardiomyopathy, dilated   . Small bowel obstruction 10/2012  . COPD (chronic obstructive pulmonary disease)   . Herpes     BUTT AND BACK- 9/23 HEALED  . Atrial flutter by electrocardiogram 11/2012  . GERD (gastroesophageal reflux disease)   . Gout      Past Surgical History  Procedure Laterality Date  . Laparoscopic incisional / umbilical / ventral hernia repair       Family History  Problem Relation Age of Onset  . Cancer Neg Hx      Social History: History   Social History  . Marital Status: Single    Spouse Name: N/A    Number of Children: N/A  . Years of Education: N/A   Occupational History  . Not on file.   Social History Main Topics  . Smoking  status: Former Smoker -- 0.25 packs/day for .5 years    Types: Cigarettes    Quit date: 09/28/2012  . Smokeless tobacco: Never Used  . Alcohol Use: No  . Drug Use: No  . Sexual Activity: Not on file   Other Topics Concern  . Not on file   Social History Narrative     Prescriptions prior to admission  Medication Sig Dispense Refill Last Dose  . allopurinol (ZYLOPRIM) 100 MG tablet Take 1 tablet (100 mg total) by mouth daily. 30 tablet 6 03/06/2014  . amiodarone (PACERONE) 200 MG tablet Take 1 tablet (200 mg total) by mouth daily. 90 tablet 3 03/06/2014  . aspirin EC 81 MG tablet Take 81 mg by mouth daily.   03/06/2014  . carvedilol (COREG) 3.125 MG tablet Take 1 tablet (3.125 mg total) by mouth 2 (two) times daily. 180 tablet 3 03/06/2014 at 1700  . digoxin (LANOXIN) 0.125 MG tablet TAKE ONE TABLET BY MOUTH EVERY OTHER DAY 15 tablet 2 03/06/2014 at Unknown time  . isosorbide dinitrate (ISORDIL) 10 MG tablet Take 10 mg by mouth 3 (three) times daily.   03/06/2014  . lisinopril (PRINIVIL,ZESTRIL) 2.5 MG tablet Take 2.5 mg by mouth daily.   03/06/2014  . spironolactone (ALDACTONE) 25 MG tablet Take 25 mg by mouth daily.   03/06/2014  . torsemide (DEMADEX) 20 MG tablet Take 1 tablet (20 mg total) by mouth 2 (  two) times a week. Every Monday and Friday, Hold for wt less than 193 lb 180 tablet 3 03/06/2014    Scheduled Meds: . allopurinol  100 mg Oral Daily  . amiodarone  200 mg Oral Daily  . aspirin EC  81 mg Oral Daily  . carvedilol  3.125 mg Oral BID WC  . cefTRIAXone (ROCEPHIN)  IV  1 g Intravenous Q24H  . colchicine  0.3 mg Oral Daily  . digoxin  0.125 mg Oral QODAY  . furosemide  80 mg Intravenous BID  . isosorbide dinitrate  10 mg Oral TID  . metroNIDAZOLE  500 mg Oral 3 times per day  . predniSONE  50 mg Oral Q breakfast  . sodium chloride  3 mL Intravenous Q12H  . spironolactone  25 mg Oral Daily   Continuous Infusions:  PRN Meds:.sodium chloride, acetaminophen, guaifenesin, ondansetron  (ZOFRAN) IV, sodium chloride  ROS: General: no fevers/chills/night sweats, feels poorly. Eyes: no blurry vision, diplopia, or amaurosis ENT: no sore throat or hearing loss GI: no abdominal pain, nausea, vomiting, diarrhea, or constipation GU: no dysuria, frequency, or hematuria Skin: no rash Neuro: no headache, numbness, tingling, or weakness of extremities Heme: no bleeding, DVT, or easy bruising Endo: no polydipsia or polyuria, other systems negative.    Physical Exam: Blood pressure 121/74, pulse 102, temperature 99.2 F (37.3 C), temperature source Oral, resp. rate 18, weight 111.086 kg (244 lb 14.4 oz), SpO2 98 %.   General appearance: alert, cooperative, appears older than stated age, no distress and moderately obese Lungs: rales bibasilar and bilaterally Heart: S1, S2 normal, S3 present and no murmur Abdomen: normal findings: bowel sounds normal and no bruits heard and abnormal findings:  hepatomegaly, liver edge palpable 2 cm below costal margin Extremities: edema 3 plus, pitting. Right upper extremity is very warm and in significant amount of pain with mobility, right wrist and fingers appeared to be inflamed. Pulses: 2+ and symmetric Neurologic: Grossly normal, cannot examine strength on his right upper extremity, due to severe pain. No other neurologic deficit was evident.  Labs:   Lab Results  Component Value Date   WBC 12.0* 03/10/2014   HGB 9.6* 03/10/2014   HCT 31.7* 03/10/2014   MCV 81.5 03/10/2014   PLT 218 03/10/2014    Recent Labs Lab 03/11/14 0501  NA 139  K 4.3  CL 109  CO2 23  BUN 38*  CREATININE 2.38*  CALCIUM 8.1*  GLUCOSE 100*   Lab Results  Component Value Date   CKTOTAL 706* 09/12/2013   TROPONINI <0.30 09/12/2013    Lipid Panel     Component Value Date/Time   TRIG 133 03/10/2013 0315    EKG: Sinus rhythm with first degree AV block, left axis deviation, left anterior physical block, right bundle branch block. Trifascicular block.  LVH with repolarization abnormality, cannot exclude lateral ischemia.  Echocardiogram 03/10/2013 and comparision  09/13/2012 LVEF 20-25%, right ventricle moderately dilated and hypokinetic. Findings consistent with dilated cardiomyopathy with grade II diastolic dysfunction.    Radiology: Dg Chest 2 View  03/10/2014   CLINICAL DATA:  Bilateral feet swelling, cough, fever x1 week  EXAM: CHEST  2 VIEW  COMPARISON:  09/11/2013  FINDINGS: Cardiomegaly with pulmonary vascular congestion and suspected mild interstitial edema.  Left lung base is obscured, favored to reflect a small left pleural effusion with left lower lobe atelectasis. Left lower lobe pneumonia is not excluded.  No pneumothorax.  Degenerative changes of the visualized thoracolumbar spine.  IMPRESSION: Cardiomegaly with  mild interstitial edema.  Left lung base is obscured, favored to reflect a small left pleural effusion with left lower lobe atelectasis. Left lower lobe pneumonia is not excluded.   Electronically Signed   By: Julian Hy M.D.   On: 03/10/2014 18:12   US Renal  03/11/2014   CLINICAL DATA:  Chronic renal disease.  Initial encounter.  EXAM: RENAL/URINARY TRACT ULTRASOUND COMPLETE  COMPARISON:  Renal ultrasound performed 11/23/2012, and CT of the abdomen and pelvis performed 10/08/2012  FINDINGS: Right Kidney:  Length: 13.1 cm. Echogenicity within normal limits. No mass or hydronephrosis visualized.  Left Kidney:  Length: 13.1 cm. Echogenicity within normal limits. Two small cysts are seen at the interpole region of the left kidney, measuring 1.8 cm and 1.4 cm. No hydronephrosis visualized.  Bladder:  Appears normal for degree of bladder distention.  IMPRESSION: No evidence of hydronephrosis.  Two small left renal cysts seen.   Electronically Signed   By: Garald Balding M.D.   On: 03/11/2014 07:32    ASSESSMENT AND PLAN:  1. Acute on chronic systolic and diastolic heart failure, involving both the right ventricle and left  ventricle. Biventricular failure. 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  2. H/O atrial flutter with Variable AV conduction. Patient is now in sinus rhythm on Amiodarone.  CHA2DS2-VASCScore: Risk Score  1,  Yearly risk of stroke  1.3%. Recommendation: ASA /Anticoagulation   [place] Has Bled: Score 1.02. Estimated risk of major bleeding at 1 year with [place] 1.02-1.5% per year.  3. Acute kidney injury and acute renal failure, multifactorial etiology, UTI and urosepsis along with prerenal failure from both LV and RV failure. 3. COPD and history of prior tobacco use. Patient's presentation is consistent with biventricular failure and chronic cor pulmonale.  4. Acute gout involving the right upper extremity. 5. Anemia of chronic disease, no obvious GI bleed in the past.   Recommendation: Patient with significant fluid overload state, due to biventricular failure. Overall patient's response to diuresis will be low due to biventricular failure. He will benefit from addition of dobutamine for at least 48 to 72  hours.  He has difficulty moving his right hand, in July 2015, here presented with difficulty moving his left arm in a similar fashion, was found to be due to gout. Patient has significant signs of inflammation of the right upper extremity, no other neurologic deficits was evident including any speech difficulty, hence my suspicion for stroke is low, once his gout is under control, if his upper extremity especially the right upper extremity strength does not improve, we should presume that he has had a stroke. At this point it'll not make any significant therapeutic difference, patient states that he is unable to move his right upper extremity over the past 1 week.  Although patient has atrial flutter, he is not a good candidate for anticoagulation due to gait instability, noncompliance with follow-ups and chronic anemia. Also risk of bleeding and  bleeding competitions is much higher than benefits at this point. Overall extremely guarded prognosis in this patient, he is not a candidate for ICD implantation.  I'll continue to follow the patient with you, I'll see the patient on Monday. We will also add hydralazine along with nitrate for his CHF as ACE inhibitors or ARB's cannot be tolerated due to acute renal failure.  Laverda Page, MD 03/11/2014, 2:02 PM [facility]. [place] Pager: [phone] Office: [phone] If no answer Cell [phone]

## 2014-03-11 NOTE — Progress Notes (Signed)
Report given to 3W RN Mickel Baas. Pt transferred to [id number].

## 2014-03-12 DIAGNOSIS — A0472 Enterocolitis due to Clostridium difficile, not specified as recurrent: Secondary | ICD-10-CM

## 2014-03-12 DIAGNOSIS — R609 Edema, unspecified: Secondary | ICD-10-CM

## 2014-03-12 HISTORY — DX: Enterocolitis due to Clostridium difficile, not specified as recurrent: A04.72

## 2014-03-12 LAB — CBC
HEMATOCRIT: 27 % — AB (ref 39.0–52.0)
HEMOGLOBIN: 8.1 g/dL — AB (ref 13.0–17.0)
MCH: 23.6 pg — ABNORMAL LOW (ref 26.0–34.0)
MCHC: 30 g/dL (ref 30.0–36.0)
MCV: 78.7 fL (ref 78.0–100.0)
Platelets: 193 10*3/uL (ref 150–400)
RBC: 3.43 MIL/uL — AB (ref 4.22–5.81)
RDW: 18.4 % — AB (ref 11.5–15.5)
WBC: 8.6 10*3/uL (ref 4.0–10.5)

## 2014-03-12 LAB — BASIC METABOLIC PANEL
Anion gap: 7 (ref 5–15)
BUN: 43 mg/dL — ABNORMAL HIGH (ref 6–23)
CO2: 26 mmol/L (ref 19–32)
Calcium: 8.2 mg/dL — ABNORMAL LOW (ref 8.4–10.5)
Chloride: 109 mEq/L (ref 96–112)
Creatinine, Ser: 2.27 mg/dL — ABNORMAL HIGH (ref 0.50–1.35)
GFR calc non Af Amer: 29 mL/min — ABNORMAL LOW (ref >90)
GFR, EST AFRICAN AMERICAN: 34 mL/min — AB (ref >90)
Glucose, Bld: 130 mg/dL — ABNORMAL HIGH (ref 70–99)
POTASSIUM: 4.2 mmol/L (ref 3.5–5.1)
Sodium: 142 mmol/L (ref 135–145)

## 2014-03-12 MED ORDER — AMIODARONE HCL 200 MG PO TABS
200.0000 mg | ORAL_TABLET | Freq: Three times a day (TID) | ORAL | Status: DC
  Administered 2014-03-12 – 2014-03-13 (×4): 200 mg via ORAL
  Filled 2014-03-12 (×4): qty 1

## 2014-03-12 MED ORDER — AMIODARONE HCL 200 MG PO TABS
200.0000 mg | ORAL_TABLET | Freq: Three times a day (TID) | ORAL | Status: DC
  Administered 2014-03-12: 200 mg via ORAL

## 2014-03-12 NOTE — Progress Notes (Signed)
  Echocardiogram 2D Echocardiogram has been performed.  Caleb Bauer FRANCES 03/12/2014, 4:22 PM

## 2014-03-12 NOTE — Progress Notes (Signed)
Kathline Magic NP made aware that patient had 7 beat run of v-tach. Patient asymptomatic, VSS, order for mag level. [facility] [employer]

## 2014-03-12 NOTE — Progress Notes (Signed)
[facility] PROGRESS NOTE  Caleb Bauer XBD:[id number] DOB: 13-Nov-1950 DOA: 03/10/2014 PCP: Laverda Page, MD  Assessment/Plan: 64 year old  1-Acute on chronic combine Heart failure. Last EF 25 % by ECHO 03-10-2013.  -Continue with lasix 80 mg IV BID.  -Continue with Isosorbide (holder parameters for BP), digoxin, coreg.  -Started on Dobutamine.  -Cardiology consulted. Appreciate Dr Einar Gip help.  -urine out put : 1.2 L  -ECHO pending.   2-Leukocytosis; Continue with ceftriaxone to cover for UTI.   3-Chronic renal failure, stage III;  Cr per records 1.5 to 2.2. Monitor on lasix,   4-UTI; send  urine culture. Continue with ceftriaxone day /3.   5-Gout flare: pain right had with swelling. Continue with colchicine and prednisone.   6-Lower extremity edema, worse on the left: will check doppler.   7-Screening for HIV ordered.   8-A flutter; continue with amiodarone.   9-C diff colitis: Diarrhea; c diff came back positive. Started  Flagyl 1-09. Diarrhea improving.  10-SVT; amiodarone dose increase.   Code Status: Full Code.  Family Communication: care discussed with patient,. Disposition Plan: remain inpatient,.    Consultants:  cardiology  Procedures:  Renal US. No evidence of hydronephrosis. Two small left renal cysts seen.  Antibiotics:  Ceftriaxone 1-08  HPI/Subjective: Feeling better. He is able to move right arm, hand better, pain better.  He is breathing better. He was having diarrhea at home, started last week.  He is breathing better.   Objective: Filed Vitals:   03/12/14 0955  BP: 125/75  Pulse: 75  Temp:   Resp: 25    Intake/Output Summary (Last 24 hours) at 03/12/14 1033 Last data filed at 03/12/14 0830  Gross per 24 hour  Intake   1052 ml  Output    801 ml  Net    251 ml   Filed Weights   03/10/14 2257 03/11/14 1910 03/12/14 0425  Weight: 111.086 kg (244 lb 14.4 oz) 113.036 kg (249 lb 3.2 oz) 112.855 kg (248 lb 12.8 oz)     Exam:   General:  Alert in no distress.   Cardiovascular:S 1, S 2 RRR  Respiratory: Crackles bases, normal respiratory effort.   Abdomen: BS present, distended.   Musculoskeletal: right had with edema,  tender to palpation. Bilateral LE with plus 2 edema, worse on the left.   Data Reviewed: Basic Metabolic Panel:  Recent Labs Lab 03/10/14 1722 03/11/14 0501 03/11/14 2123 03/12/14 0430  NA 143 139  --  142  K 4.3 4.3  --  4.2  CL 111 109  --  109  CO2 26 23  --  26  GLUCOSE 101* 100*  --  130*  BUN 35* 38*  --  43*  CREATININE 2.03* 2.38*  --  2.27*  CALCIUM 8.6 8.1*  --  8.2*  MG  --   --  2.0  --    Liver Function Tests: No results for input(s): AST, ALT, ALKPHOS, BILITOT, PROT, ALBUMIN in the last 168 hours. No results for input(s): LIPASE, AMYLASE in the last 168 hours. No results for input(s): AMMONIA in the last 168 hours. CBC:  Recent Labs Lab 03/10/14 1722 03/12/14 0430  WBC 12.0* 8.6  NEUTROABS 9.3*  --   HGB 9.6* 8.1*  HCT 31.7* 27.0*  MCV 81.5 78.7  PLT 218 193   Cardiac Enzymes: No results for input(s): CKTOTAL, CKMB, CKMBINDEX, TROPONINI in the last 168 hours. BNP (last 3 results)  Recent Labs  03/23/13 1255 04/07/13  0223 07/04/13 1403  PROBNP >5000.0* 30408.0* 7985.0*   CBG:  Recent Labs Lab 03/11/14 2137  GLUCAP 204*    Recent Results (from the past 240 hour(s))  Clostridium Difficile by PCR     Status: Abnormal   Collection Time: 03/11/14 10:00 AM  Result Value Ref Range Status   C difficile by pcr POSITIVE (A) NEGATIVE Final    Comment: CRITICAL RESULT CALLED TO, READ BACK BY AND VERIFIED WITH: KELLY K.,RN 01.09.16 1246 BY JONESJ      Studies: Dg Chest 2 View  03/10/2014   CLINICAL DATA:  Bilateral feet swelling, cough, fever x1 week  EXAM: CHEST  2 VIEW  COMPARISON:  09/11/2013  FINDINGS: Cardiomegaly with pulmonary vascular congestion and suspected mild interstitial edema.  Left lung base is obscured, favored to  reflect a small left pleural effusion with left lower lobe atelectasis. Left lower lobe pneumonia is not excluded.  No pneumothorax.  Degenerative changes of the visualized thoracolumbar spine.  IMPRESSION: Cardiomegaly with mild interstitial edema.  Left lung base is obscured, favored to reflect a small left pleural effusion with left lower lobe atelectasis. Left lower lobe pneumonia is not excluded.   Electronically Signed   By: Julian Hy M.D.   On: 03/10/2014 18:12   US Renal  03/11/2014   CLINICAL DATA:  Chronic renal disease.  Initial encounter.  EXAM: RENAL/URINARY TRACT ULTRASOUND COMPLETE  COMPARISON:  Renal ultrasound performed 11/23/2012, and CT of the abdomen and pelvis performed 10/08/2012  FINDINGS: Right Kidney:  Length: 13.1 cm. Echogenicity within normal limits. No mass or hydronephrosis visualized.  Left Kidney:  Length: 13.1 cm. Echogenicity within normal limits. Two small cysts are seen at the interpole region of the left kidney, measuring 1.8 cm and 1.4 cm. No hydronephrosis visualized.  Bladder:  Appears normal for degree of bladder distention.  IMPRESSION: No evidence of hydronephrosis.  Two small left renal cysts seen.   Electronically Signed   By: Garald Balding M.D.   On: 03/11/2014 07:32    Scheduled Meds: . allopurinol  100 mg Oral Daily  . amiodarone  200 mg Oral 3 times per day  . aspirin EC  81 mg Oral Daily  . carvedilol  3.125 mg Oral BID WC  . cefTRIAXone (ROCEPHIN)  IV  1 g Intravenous Q24H  . colchicine  0.3 mg Oral Daily  . digoxin  0.125 mg Oral QODAY  . furosemide  80 mg Intravenous BID  . isosorbide dinitrate  10 mg Oral TID  . metroNIDAZOLE  500 mg Oral 3 times per day  . predniSONE  50 mg Oral Q breakfast  . sodium chloride  3 mL Intravenous Q12H  . spironolactone  25 mg Oral Daily   Continuous Infusions: . DOBUTamine 2.52 mcg/kg/min (03/11/14 2018)    Active Problems:   Acute on chronic combined systolic and diastolic congestive heart  failure, NYHA class 4   UTI (lower urinary tract infection)   Cardiorenal syndrome with renal failure   Acute-on-chronic kidney injury    Time spent: 35 minutes.     Niel Hummer A  [facility] Pager [phone]. If 7PM-7AM, please contact night-coverage at [email or web address], password [facility] 03/12/2014, 10:33 AM  LOS: 2 days

## 2014-03-12 NOTE — Progress Notes (Signed)
Non sustained ventricular tachycardia noted on telemetry on low dose dobutamine. Increase amiodarone to 200mg  tid. Code status needs to be addressed. Overall guarded prognosis. Is/Os not well documented and daily weights need documentation. Good UOP on Dobutamine and tolerating this with no hypotension. Continue present therapy for now. Electrolytes stable and S. Cr improving. Could not get in touch with his niece who makes medical decisions.

## 2014-03-12 NOTE — Progress Notes (Signed)
Patient mag level came back 2.0. Rapid response was notified to assess patient due to few episodes of v-tach runs. States will continue to monitor and rhythm appears to be runs of SVT with wide QRS complex. Patient is asymptomatic with each episode and Vital signs remain stable. [facility] [employer]

## 2014-03-12 NOTE — Progress Notes (Signed)
*  Preliminary Results* Bilateral lower extremity venous duplex completed. Bilateral lower extremities are negative for deep vein thrombosis. There is no evidence of Baker's cyst bilaterally.  03/12/2014  Maudry Mayhew, RVT, RDCS, RDMS

## 2014-03-13 DIAGNOSIS — A047 Enterocolitis due to Clostridium difficile: Secondary | ICD-10-CM

## 2014-03-13 DIAGNOSIS — M10031 Idiopathic gout, right wrist: Secondary | ICD-10-CM

## 2014-03-13 LAB — CBC
HEMATOCRIT: 27.3 % — AB (ref 39.0–52.0)
Hemoglobin: 8.2 g/dL — ABNORMAL LOW (ref 13.0–17.0)
MCH: 23.6 pg — AB (ref 26.0–34.0)
MCHC: 30 g/dL (ref 30.0–36.0)
MCV: 78.4 fL (ref 78.0–100.0)
PLATELETS: 197 10*3/uL (ref 150–400)
RBC: 3.48 MIL/uL — ABNORMAL LOW (ref 4.22–5.81)
RDW: 18.2 % — AB (ref 11.5–15.5)
WBC: 7.5 10*3/uL (ref 4.0–10.5)

## 2014-03-13 LAB — BASIC METABOLIC PANEL
Anion gap: 10 (ref 5–15)
BUN: 49 mg/dL — ABNORMAL HIGH (ref 6–23)
CO2: 23 mmol/L (ref 19–32)
Calcium: 8.2 mg/dL — ABNORMAL LOW (ref 8.4–10.5)
Chloride: 106 mEq/L (ref 96–112)
Creatinine, Ser: 2.07 mg/dL — ABNORMAL HIGH (ref 0.50–1.35)
GFR calc Af Amer: 38 mL/min — ABNORMAL LOW (ref >90)
GFR calc non Af Amer: 32 mL/min — ABNORMAL LOW (ref >90)
GLUCOSE: 106 mg/dL — AB (ref 70–99)
Potassium: 4.3 mmol/L (ref 3.5–5.1)
SODIUM: 139 mmol/L (ref 135–145)

## 2014-03-13 LAB — URINE CULTURE: Colony Count: 25000

## 2014-03-13 LAB — HIV ANTIBODY (ROUTINE TESTING W REFLEX)
HIV 1/HIV 2 AB: NONREACTIVE
HIV 1/O/2 Abs-Index Value: <1 (ref <1.00)

## 2014-03-13 MED ORDER — PREDNISONE 20 MG PO TABS
40.0000 mg | ORAL_TABLET | Freq: Every day | ORAL | Status: DC
  Administered 2014-03-14 – 2014-03-15 (×2): 40 mg via ORAL
  Filled 2014-03-13 (×2): qty 2

## 2014-03-13 MED ORDER — AMIODARONE HCL 200 MG PO TABS
200.0000 mg | ORAL_TABLET | Freq: Every day | ORAL | Status: DC
  Administered 2014-03-14 – 2014-03-16 (×3): 200 mg via ORAL
  Filled 2014-03-13 (×3): qty 1

## 2014-03-13 NOTE — Progress Notes (Signed)
Subjective:  Patient feels much improved.  States that his edema is diminished resolved in his lower extremities, and right upper extremity edema has also improved significantly.  He is now able to move his right hand.  Overall he feels well and dyspnea also has improved.  Objective:  Vital Signs in the last 24 hours: Temp:  [97.7 F (36.5 C)-98.4 F (36.9 C)] 98.4 F (36.9 C) (01/11 1621) Pulse Rate:  [58-68] 65 (01/11 1653) Resp:  [20-26] 26 (01/11 1621) BP: (108-144)/(67-93) 114/68 mmHg (01/11 1621) SpO2:  [100 %] 100 % (01/11 1621) Weight:  [112.129 kg (247 lb 3.2 oz)] 112.129 kg (247 lb 3.2 oz) (01/11 0500)  Intake/Output from previous day: 01/10 0701 - 01/11 0700 In: 885 [P.O.:720; I.V.:101] Out: 4001 [Urine:4000; Stool:1]  Physical Exam:   General appearance: alert, cooperative, appears older than stated age, no distress and mildly obese Eyes: negative findings: lids and lashes normal Neck: no adenopathy, no carotid bruit, supple, symmetrical, trachea midline and JVD present Neck: JVP - normal, carotids 2+= without bruits Resp: bilateral faint expiratory rhonchi heard, right base has find crackles. Chest wall: no tenderness Cardio: regular rate and rhythm, S1, S2 normal, no murmur, click, rub or gallop GI: soft, non-tender; bowel sounds normal; no masses,  no organomegaly Extremities: right upper extremity edema has improved significantly.  Bilateral  lower extremity edema has completely resolved.  Full range of motion movements.    Lab Results: BMP  Recent Labs  03/11/14 0501 03/12/14 0430 03/13/14 0407  NA 139 142 139  K 4.3 4.2 4.3  CL 109 109 106  CO2 23 26 23   GLUCOSE 100* 130* 106*  BUN 38* 43* 49*  CREATININE 2.38* 2.27* 2.07*  CALCIUM 8.1* 8.2* 8.2*  GFRNONAA 27* 29* 32*  GFRAA 32* 34* 38*    CBC  Recent Labs Lab 03/10/14 1722  03/13/14 0407  WBC 12.0*  < > 7.5  RBC 3.89*  < > 3.48*  HGB 9.6*  < > 8.2*  HCT 31.7*  < > 27.3*  PLT 218  < >  197  MCV 81.5  < > 78.4  MCH 24.7*  < > 23.6*  MCHC 30.3  < > 30.0  RDW 18.5*  < > 18.2*  LYMPHSABS 1.4  --   --   MONOABS 1.3*  --   --   EOSABS 0.0  --   --   BASOSABS 0.0  --   --   < > = values in this interval not displayed.  HEMOGLOBIN A1C Lab Results  Component Value Date   HGBA1C 6.2* 09/12/2012   MPG 131* 09/12/2012    Cardiac Panel (last 3 results)  Recent Labs  09/12/13 0030 09/12/13 0312 09/12/13 0500  CKTOTAL 833*  --  706*  TROPONINI  --  <0.30  --     BNP (last 3 results)  Recent Labs  03/23/13 1255 04/07/13 0223 07/04/13 1403  PROBNP >5000.0* 30408.0* 7985.0*    TSH  Recent Labs  07/04/13 1403  TSH 1.640    CHOLESTEROL No results for input(s): CHOL in the last 8760 hours.  Hepatic Function Panel  Recent Labs  06/01/13 1316 07/04/13 1403 09/12/13 0312 09/12/13 0500  PROT 10.1* 8.2 7.8 8.0  ALBUMIN 3.2* 2.5* 2.5* 2.5*  AST 15 9 26 25   ALT 11 5 10 11   ALKPHOS 51 66 82 88  BILITOT 0.4 0.8 0.6 0.6  BILIDIR <0.2  --  0.3  --   IBILI NOT CALCULATED  --  0.3  --     Imaging: Imaging results have been reviewed  Cardiac Studies:  EKG: Sinus rhythm with first degree AV block, left axis deviation, left anterior physical block, right bundle branch block. Trifascicular block. LVH with repolarization abnormality, cannot exclude lateral ischemia.  Telemetry 03/12/2014: Brief episodes of nonsustained ventricular tachycardia.  Echocardiogram 03/12/2014: Markedly dilated LV, grade 3 diastolic dysfunction, LVEF 15-20%.  Right ventricle moderately dilated and hypokinetic.  Mild mitral regurgitation.  LVH.  Assessment/Plan:  1. Acute on chronic systolic and diastolic heart failure, involving both the right ventricle and left ventricle. Biventricular failure. 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 Heart failure symptoms markedly improved on 48 hours of dobutamine infusion.  2. H/O atrial  flutter with Variable AV conduction. Patient is now in sinus rhythm on Amiodarone.  CHA2DS2-VASCScore: Risk Score 1, Yearly risk of stroke 1.3%. Recommendation: ASA /Anticoagulation   [place] Has Bled: Score 1.02. Estimated risk of major bleeding at 1 year with [place] 1.02-1.5% per year.  3. Acute kidney injury and acute renal failure, multifactorial etiology, UTI and urosepsis along with prerenal failure from both LV and RV failure. Serum creatinine improved on dobutamine. 3. COPD and history of prior tobacco use. Patient's presentation is consistent with biventricular failure and chronic cor pulmonale.  4. Acute gout involving the right upper extremity. 5. Anemia of chronic disease, no obvious GI bleed in the past.   6.  Hypertension with hypertensive heart disease  Recommendation: I have discontinued dobutamine.  I had a very long discussion with the patient regarding long-term outcomes, including high risk of sudden cardiac.  Given his multiple medical comorbidities, patient not a candidate for ICD therapy at this point and also not a candidate for resynchronization therapy due to presence of right ventricular failure.  Also compliance is another issue.,  Patient does not want to be resuscitated in case of sudden cardiac.  I also called his niece Ms. [name] Corns and had a long discussion regarding overall guarded long-term prognosis and made aware that his medical condition has improved now, however long-term prognosis still remains guarded.  She is in agreement to make him DNR.  I will reduce the dose of amiodarone back to 200 mg by mouth daily.obtain a CMP to evaluate protein calorie malnutrition.  Laverda Page, M.D. 03/13/2014, 5:50 PM [facility], [place] Pager: [phone] Office: [phone] If no answer: [phone]

## 2014-03-13 NOTE — Evaluation (Signed)
Occupational Therapy Evaluation Patient Details Name: Caleb Bauer MRN: [id number] DOB: 12-Jan-1951 Today's Date: 03/13/2014    History of Present Illness   64 y.o. male who presents to the ED with BLE leg pain and edema, onset last week, and getting worse over the past 1 week. There is also RUE edema as well.   Clinical Impression   Pt admitted with above. Pt independent with ADLs, PTA. Feel pt will benefit from acute OT to increase independence and strength prior to d/c.     Follow Up Recommendations  Home health OT;Supervision - Intermittent    Equipment Recommendations  None recommended by OT    Recommendations for Other Services       Precautions / Restrictions Precautions Precautions: Fall Restrictions Weight Bearing Restrictions: No      Mobility Bed Mobility Overal bed mobility: Needs Assistance Bed Mobility: Supine to Sit     Supine to sit: Supervision        Transfers Overall transfer level: Needs assistance Equipment used: Rolling walker (2 wheeled) Transfers: Sit to/from Stand Sit to Stand: Min guard              Balance                                            ADL Overall ADL's : Needs assistance/impaired                     Lower Body Dressing: Min guard;Sit to/from stand   Toilet Transfer: Min guard;Ambulation;RW (bed)           Functional mobility during ADLs: Min guard;Rolling walker General ADL Comments: Educated on safety such as sitting for LB ADLs. Briefly talked about tub transfer technique and recommended someone being with him for this.  Suggested that a long handled sponge may be helpful for UB bathing. as he reports difficulty reaching his back.     Vision                     Perception     Praxis      Pertinent Vitals/Pain Pain Assessment: No/denies pain; O2 monitor read in 70's on 2L of O2 when pt ambulating in room short distance, but unsure of accuracy. O2 trended up.      Hand Dominance     Extremity/Trunk Assessment Upper Extremity Assessment Upper Extremity Assessment: RUE deficits/detail;LUE deficits/detail RUE Deficits / Details: less than 90 degrees AROM due to weakness and reports pain in shoulder LUE Deficits / Details: approximately 90 degrees AROM shoulder flexion due to weakness    Lower Extremity Assessment Lower Extremity Assessment: Defer to PT evaluation       Communication Communication Communication: No difficulties   Cognition Arousal/Alertness: Awake/alert Behavior During Therapy: WFL for tasks assessed/performed Overall Cognitive Status: Within Functional Limits for tasks assessed                     General Comments       Exercises       Shoulder Instructions      Home Living Family/patient expects to be discharged to:: Private residence Living Arrangements: Alone Available Help at Discharge: Other (Comment) (trying to work out assist at home) Type of Home: Apartment Home Access: Level entry     Home Layout: One level     Bathroom Shower/Tub:  Tub/shower unit   Bathroom Toilet: Handicapped height     Home Equipment: Environmental consultant - 2 wheels;Cane - single point;Shower seat;Grab bars - toilet;Grab bars - tub/shower          Prior Functioning/Environment Level of Independence: Independent with assistive device(s)             OT Diagnosis: Generalized weakness   OT Problem List: Decreased strength;Decreased range of motion;Decreased knowledge of use of DME or AE;Decreased knowledge of precautions;Decreased activity tolerance   OT Treatment/Interventions: Self-care/ADL training;Therapeutic exercise;DME and/or AE instruction;Therapeutic activities;Patient/family education;Balance training    OT Goals(Current goals can be found in the care plan section) Acute Rehab OT Goals Patient Stated Goal: not stated OT Goal Formulation: With patient Time For Goal Achievement: 03/20/14 Potential to Achieve  Goals: Good ADL Goals Pt Will Perform Lower Body Bathing: with modified independence;sit to/from stand Pt Will Perform Lower Body Dressing: sit to/from stand;with modified independence Pt Will Transfer to Toilet: with modified independence;ambulating Pt Will Perform Tub/Shower Transfer: Tub transfer;with min guard assist;ambulating;rolling walker;shower seat  OT Frequency: Min 2X/week   Barriers to D/C:            Co-evaluation              End of Session Equipment Utilized During Treatment: Gait belt;Rolling walker;Oxygen  Activity Tolerance: Patient tolerated treatment well Patient left: in chair;with call bell/phone within reach   Time: 1514-1530 OT Time Calculation (min): 16 min Charges:  OT General Charges $OT Visit: 1 Procedure OT Evaluation $Initial OT Evaluation Tier I: 1 Procedure G-CodesBenito Mccreedy OTR/L [phone] 03/13/2014, 3:47 PM

## 2014-03-13 NOTE — Clinical Documentation Improvement (Addendum)
  Known history of Hypertension.  Echo this admission 03/12/14 - grade 3 diastolic dysfunction, mild concentric LV hypertrophy.  CXR this admission showing Cardiomegaly.  Please document if a condition below provides greater specificity regarding the patient's history of HTN, echo and CXR findings:    - Hypertensive Heart Disease   - Other Condition   - Unable to Clinically Determine  Thank You, Erling Conte ,RN Clinical Documentation Specialist:  [phone] [place] Information Management

## 2014-03-13 NOTE — Progress Notes (Signed)
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 Last EF 25 % by ECHO 03-10-2013.  -Continue with lasix 80 mg IV BID.  -Continue with Isosorbide (holder parameters for BP), digoxin, coreg.  -on Dobutamine.  -Cardiology consulted. Appreciate Dr Einar Gip help.  -urine out put : 4 L (1-10) -ECHO with lower Ef 15 to 20 %.   2-Leukocytosis; Continue with ceftriaxone to cover for UTI.  Trending down.   3-Chronic renal failure, stage III;  Cr per records 1.5 to 2.2. Monitor on lasix,   4-UTI; Received  ceftriaxone for 3 days. Urine culture with multiple bacterial morphotype only 25,000 colonies.  Will stop ceftriaxone today   5-Gout flare: pain right had with swelling. Continue with colchicine and prednisone. Pain has improved. Will taper prednisone. Change to 40 mg starting 1-12.   6-Lower extremity edema, worse on the left:  Doppler: negative.  7-Screening for HIV ordered. Pending.   8-A flutter; continue with amiodarone.   9-C diff colitis: Diarrhea; c diff came back positive. Started  Flagyl 1-09. Diarrhea improving. Day 3/14.   10-SVT; amiodarone dose increase.  11-Anemia; hb stable at 8. Will check anemia panel.   Code Status: Discussed with patient, he wants to be Full Code.  Family Communication: care discussed with patient,. Disposition Plan: remain inpatient,.    Consultants:  cardiology  Procedures:  Renal US. No evidence of hydronephrosis. Two small left renal cysts seen.  Antibiotics:  Ceftriaxone 1-08  HPI/Subjective: Breathing better. Pain right had has resolved.  Had 1 BM yesterday. Overnight only passing flatus.   Objective: Filed Vitals:   03/13/14 0744  BP: 143/93  Pulse: 64  Temp: 97.7 F (36.5 C)  Resp: 23    Intake/Output Summary (Last 24 hours) at 03/13/14 0816 Last data filed at 03/13/14  0740  Gross per 24 hour  Intake    821 ml  Output   4176 ml  Net  -3355 ml   Filed Weights   03/11/14 1910 03/12/14 0425 03/13/14 0500  Weight: 113.036 kg (249 lb 3.2 oz) 112.855 kg (248 lb 12.8 oz) 112.129 kg (247 lb 3.2 oz)    Exam:   General:  Alert in no distress.   Cardiovascular:S 1, S 2 RRR  Respiratory: Crackles bases, normal respiratory effort.   Abdomen: BS present, distended.   Musculoskeletal: bilateral LE edema decreased. Right hadn with decrease edema, able to move right arm.   Data Reviewed: Basic Metabolic Panel:  Recent Labs Lab 03/10/14 1722 03/11/14 0501 03/11/14 2123 03/12/14 0430 03/13/14 0407  NA 143 139  --  142 139  K 4.3 4.3  --  4.2 4.3  CL 111 109  --  109 106  CO2 26 23  --  26 23  GLUCOSE 101* 100*  --  130* 106*  BUN 35* 38*  --  43* 49*  CREATININE 2.03* 2.38*  --  2.27* 2.07*  CALCIUM 8.6 8.1*  --  8.2* 8.2*  MG  --   --  2.0  --   --    Liver Function Tests: No results for input(s): AST, ALT, ALKPHOS, BILITOT, PROT, ALBUMIN in the last 168 hours. No results for input(s): LIPASE, AMYLASE in the last 168 hours. No results for input(s): AMMONIA in the last 168 hours. CBC:  Recent Labs Lab 03/10/14 1722 03/12/14 0430 03/13/14 0407  WBC 12.0* 8.6 7.5  NEUTROABS  9.3*  --   --   HGB 9.6* 8.1* 8.2*  HCT 31.7* 27.0* 27.3*  MCV 81.5 78.7 78.4  PLT 218 193 197   Cardiac Enzymes: No results for input(s): CKTOTAL, CKMB, CKMBINDEX, TROPONINI in the last 168 hours. BNP (last 3 results)  Recent Labs  03/23/13 1255 04/07/13 0223 07/04/13 1403  PROBNP >5000.0* 30408.0* 7985.0*   CBG:  Recent Labs Lab 03/11/14 2137  GLUCAP 204*    Recent Results (from the past 240 hour(s))  Clostridium Difficile by PCR     Status: Abnormal   Collection Time: 03/11/14 10:00 AM  Result Value Ref Range Status   C difficile by pcr POSITIVE (A) NEGATIVE Final    Comment: CRITICAL RESULT CALLED TO, READ BACK BY AND VERIFIED WITH: KELLY  K.,RN 01.09.16 1246 BY JONESJ   Urine culture     Status: None   Collection Time: 03/11/14 10:00 AM  Result Value Ref Range Status   Specimen Description URINE, RANDOM  Final   Special Requests NONE  Final   Colony Count   Final    25,000 COLONIES/ML Performed at [facility]    Culture   Final    Multiple bacterial morphotypes present, none predominant. Suggest appropriate recollection if clinically indicated. Performed at [facility]    Report Status 03/13/2014 FINAL  Final     Studies: No results found.  Scheduled Meds: . allopurinol  100 mg Oral Daily  . amiodarone  200 mg Oral 3 times per day  . aspirin EC  81 mg Oral Daily  . carvedilol  3.125 mg Oral BID WC  . cefTRIAXone (ROCEPHIN)  IV  1 g Intravenous Q24H  . colchicine  0.3 mg Oral Daily  . digoxin  0.125 mg Oral QODAY  . furosemide  80 mg Intravenous BID  . isosorbide dinitrate  10 mg Oral TID  . metroNIDAZOLE  500 mg Oral 3 times per day  . predniSONE  50 mg Oral Q breakfast  . sodium chloride  3 mL Intravenous Q12H  . spironolactone  25 mg Oral Daily   Continuous Infusions: . DOBUTamine 2.58 mcg/kg/min (03/12/14 2010)    Active Problems:   Acute on chronic combined systolic and diastolic congestive heart failure, NYHA class 4   UTI (lower urinary tract infection)   Gout   Cardiorenal syndrome with renal failure   Acute-on-chronic kidney injury   Enteritis due to Clostridium difficile    Time spent: 35 minutes.     Niel Hummer A  [facility] Pager [phone]. If 7PM-7AM, please contact night-coverage at [email or web address], password [facility] 03/13/2014, 8:16 AM  LOS: 3 days

## 2014-03-13 NOTE — Care Management Note (Addendum)
    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 Per pt he lives alone. Agreeable to [facility] RN Services.     Action/Plan:   CM did call [name] to make her aware of plan of care. CM did make referral for [facility] services. SOC to begin within 24-48 hours post d/c. Pt will need written orders and f32f for [facility] services.   Anticipated DC Date:  03/14/2014   Anticipated DC Plan:  [place]  CM consult      [facility] Choice  HOME HEALTH   Choice offered to / List presented to:  C-1 Patient        Echo arranged  HH-1 RN  HH-10 DISEASE MANAGEMENT  HH-2 PT  HH-3 OT  HH-4 NURSE'S AIDE      Status of service:  Completed, signed off Medicare Important Message given?  YES (If response is "NO", the following Medicare IM given date fields will be blank) Date Medicare IM given:  03/13/2014 Medicare IM given by:  GRAVES-BIGELOW,Tate Zagal Date Additional Medicare IM given:  03/16/2014 Additional Medicare IM given by:  [place]  Discharge Disposition:  [place]  Per UR Regulation:  Reviewed for med. necessity/level of care/duration of stay  If discussed at [place] of Stay Meetings, dates discussed:   03/16/2014    Comments:  03-14-14 [address], RN,BSN [phone] CM did speak with pt in regards to [facility] services and he does not have 24 hr supervision, However pt will not agree to SNF. CM did place call to Niece [name] to see if she can stop in on pt more often. Pt will need written  HH orders/f52f once stable for d/c. Pt stays at [facility] Sat to Monday @ times and then Niece checks on him daily. Personal  Care services to be set up by Niece and she is  comfortable with plan of care for skilled services. No further needs from CM at this time.

## 2014-03-13 NOTE — Progress Notes (Addendum)
Spoke with Dr. Einar Gip over the phone and verified that he spoke with the pt and his niece [name] who is his POA regarding code status.  Pt and niece both agree to make the pt a DNR. Telephone verbal orders received by Dr. Einar Gip to make pt a DNR,  verified order by reading it back to him.  Will continue to monitor.

## 2014-03-13 NOTE — Evaluation (Signed)
Physical Therapy Evaluation Patient Details Name: Caleb Bauer MRN: [id number] DOB: 05-13-1950 Today's Date: 03/13/2014   History of Present Illness  64 y.o. male who presents to the ED with BLE leg pain and edema, onset last week, and getting worse over the past 1 week.  There is also RUE edema as well.   Clinical Impression  Pt admitted with above diagnosis. Pt currently with functional limitations due to the deficits listed below (see PT Problem List). Pt with decr endurance for activities with SOB with little movement.  Sats would not register.  Limited distance today.  Will incr distance tomorrow with pt.  Should be able to use RW at home and get HHPT f/u once medically treated.   Pt will benefit from skilled PT to increase their independence and safety with mobility to allow discharge to the venue listed below.      Follow Up Recommendations Home health PT;Supervision/Assistance - 24 hour    Equipment Recommendations  None recommended by PT    Recommendations for Other Services       Precautions / Restrictions Precautions Precautions: Fall Restrictions Weight Bearing Restrictions: No      Mobility  Bed Mobility Overal bed mobility: Needs Assistance Bed Mobility: Supine to Sit     Supine to sit: Supervision     General bed mobility comments: in recliner on arrival  Transfers Overall transfer level: Needs assistance Equipment used: Rolling walker (2 wheeled) Transfers: Sit to/from Stand Sit to Stand: Min guard            Ambulation/Gait Ambulation/Gait assistance: Min guard Ambulation Distance (Feet): 5 Feet Assistive device: Rolling walker (2 wheeled) Gait Pattern/deviations: Step-through pattern;Decreased stride length   Gait velocity interpretation: Below normal speed for age/gender General Gait Details: Limited distance due to lines.  Overall gait steady with RW.  No LOB. Slightly DOE 2/4 with movement. O2 sat monitor not picking up well so could not  get a sat number.    Stairs            Wheelchair Mobility    Modified Rankin (Stroke Patients Only)       Balance Overall balance assessment: Needs assistance;History of Falls         Standing balance support: Bilateral upper extremity supported;During functional activity Standing balance-Leahy Scale: Fair Standing balance comment: can stand statically without RW.  Needs RW for dynamic activity.                              Pertinent Vitals/Pain Pain Assessment: No/denies pain  2LO2 in place with reading >90% but dropped with activity but would not read for a few minutes and then back to 90% and >.      Home Living Family/patient expects to be discharged to:: Private residence Living Arrangements: Alone Available Help at Discharge: Other (Comment) (trying to work out assist at home) Type of Home: Apartment Home Access: Level entry     Home Layout: One [place]: Environmental consultant - 2 wheels;Cane - single point;Shower seat;Grab bars - toilet;Grab bars - tub/shower      Prior Function Level of Independence: Independent with assistive device(s)         Comments: Used RW inside, cane outside     Hand Dominance   Dominant Hand: Right    Extremity/Trunk Assessment   Upper Extremity Assessment: Defer to OT evaluation         Lower Extremity Assessment: Generalized  weakness      Cervical / Trunk Assessment: Normal  Communication   Communication: No difficulties  Cognition Arousal/Alertness: Awake/alert Behavior During Therapy: WFL for tasks assessed/performed Overall Cognitive Status: Within Functional Limits for tasks assessed                      General Comments      Exercises General Exercises - Lower Extremity Ankle Circles/Pumps: AROM;Both;10 reps;Supine Quad Sets: AROM;Both;10 reps;Supine Long Arc Quad: AROM;Both;10 reps;Seated Hip Flexion/Marching: AROM;Both;10 reps;Seated      Assessment/Plan    PT  Assessment Patient needs continued PT services  PT Diagnosis Generalized weakness   PT Problem List Decreased activity tolerance;Decreased balance;Decreased mobility;Decreased knowledge of use of DME;Decreased safety awareness;Decreased knowledge of precautions  PT Treatment Interventions DME instruction;Gait training;Functional mobility training;Therapeutic activities;Therapeutic exercise;Balance training;Patient/family education   PT Goals (Current goals can be found in the Care Plan section) Acute Rehab PT Goals Patient Stated Goal: not stated PT Goal Formulation: With patient Time For Goal Achievement: 03/20/14 Potential to Achieve Goals: Good    Frequency Min 3X/week   Barriers to discharge        Co-evaluation               End of Session Equipment Utilized During Treatment: Gait belt;Oxygen Activity Tolerance: Patient limited by fatigue Patient left: in chair;with call bell/phone within reach Nurse Communication: Mobility status         Time: 0131-4388 PT Time Calculation (min) (ACUTE ONLY): 35 min   Charges:   PT Evaluation $Initial PT Evaluation Tier I: 1 Procedure PT Treatments $Therapeutic Exercise: 8-22 mins $Therapeutic Activity: 8-22 mins   PT G CodesDenice Paradise 2014/03/29, 4:30 PM [place] Kassandra Meriweather,PT Acute Rehabilitation [phone] (pager)

## 2014-03-14 LAB — COMPREHENSIVE METABOLIC PANEL
ALBUMIN: 2.1 g/dL — AB (ref 3.5–5.2)
ALK PHOS: 57 U/L (ref 39–117)
ALT: 8 U/L (ref 0–53)
AST: 12 U/L (ref 0–37)
Anion gap: 15 (ref 5–15)
BUN: 53 mg/dL — AB (ref 6–23)
CHLORIDE: 104 meq/L (ref 96–112)
CO2: 22 mmol/L (ref 19–32)
Calcium: 8.5 mg/dL (ref 8.4–10.5)
Creatinine, Ser: 2.18 mg/dL — ABNORMAL HIGH (ref 0.50–1.35)
GFR calc Af Amer: 35 mL/min — ABNORMAL LOW (ref >90)
GFR calc non Af Amer: 30 mL/min — ABNORMAL LOW (ref >90)
Glucose, Bld: 101 mg/dL — ABNORMAL HIGH (ref 70–99)
POTASSIUM: 4.5 mmol/L (ref 3.5–5.1)
SODIUM: 141 mmol/L (ref 135–145)
Total Bilirubin: 0.5 mg/dL (ref 0.3–1.2)
Total Protein: 6.8 g/dL (ref 6.0–8.3)

## 2014-03-14 NOTE — Progress Notes (Signed)
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 Last EF 25 % by ECHO 03-10-2013.  -Continue with lasix 80 mg IV BID.  -Continue with Isosorbide (holder parameters for BP), digoxin, coreg.  -Received Dobutamine from 01-09 until 1-11.  -Cardiology consulted. Appreciate Dr Einar Gip help.  -urine out put : 2.L (1-11) -ECHO with lower Ef 15 to 20 %.  -Weight 113------108.   2-Leukocytosis; In the setting of UTI and C diff.   Trending down.   3-Chronic renal failure, stage III;  Cr per records 1.5 to 2.2. Monitor on lasix,   4-UTI; Received  ceftriaxone for 3 days. Urine culture with multiple bacterial morphotype only 25,000 colonies.  - ceftriaxone discontinue 1-11  5-Gout flare: pain right had with swelling. Continue with colchicine and prednisone. Pain has improved. Will taper prednisone. Change to 40 mg starting 1-12.   6-Lower extremity edema, worse on the left:  Doppler: negative.   7-Screening for HIV negative.   8-A flutter; continue with amiodarone.   9-C diff colitis: Diarrhea; c diff came back positive. Started  Flagyl 1-09. Diarrhea improving. Day 4/14.   10-SVT; amiodarone 200 mg daily.  11-Anemia; hb stable at 8. Will check anemia panel.   Code Status: DNR Family Communication: care discussed with patient,. Disposition Plan: remain inpatient,. Home when medical stable, after improvement HF.    Consultants:  cardiology  Procedures:  Renal US. No evidence of hydronephrosis. Two small left renal cysts seen.  Antibiotics:  Ceftriaxone 1-08---1-11  HPI/Subjective: Feeling better. Breathing better. Diarrhea improved, had only one BM yesterday.   Objective: Filed Vitals:   03/14/14 0800  BP: 117/72  Pulse: 63  Temp: 97.7 F (36.5 C)  Resp: 20    Intake/Output Summary (Last 24 hours) at 03/14/14 0813 Last  data filed at 03/14/14 0528  Gross per 24 hour  Intake   1203 ml  Output   2201 ml  Net   -998 ml   Filed Weights   03/12/14 0425 03/13/14 0500 03/14/14 0434  Weight: 112.855 kg (248 lb 12.8 oz) 112.129 kg (247 lb 3.2 oz) 108.364 kg (238 lb 14.4 oz)    Exam:   General:  Alert in no distress.   Cardiovascular:S 1, S 2 RRR  Respiratory: Crackles bases, normal respiratory effort.   Abdomen: BS present, distended.   Musculoskeletal: bilateral LE edema decreased. Right hand with decrease edema, able to move right arm.   Data Reviewed: Basic Metabolic Panel:  Recent Labs Lab 03/10/14 1722 03/11/14 0501 03/11/14 2123 03/12/14 0430 03/13/14 0407 03/14/14 0428  NA 143 139  --  142 139 141  K 4.3 4.3  --  4.2 4.3 4.5  CL 111 109  --  109 106 104  CO2 26 23  --  26 23 22   GLUCOSE 101* 100*  --  130* 106* 101*  BUN 35* 38*  --  43* 49* 53*  CREATININE 2.03* 2.38*  --  2.27* 2.07* 2.18*  CALCIUM 8.6 8.1*  --  8.2* 8.2* 8.5  MG  --   --  2.0  --   --   --    Liver Function Tests:  Recent Labs Lab 03/14/14 0428  AST 12  ALT 8  ALKPHOS 57  BILITOT 0.5  PROT 6.8  ALBUMIN 2.1*   No results for input(s): LIPASE, AMYLASE in the last 168 hours. No results for  input(s): AMMONIA in the last 168 hours. CBC:  Recent Labs Lab 03/10/14 1722 03/12/14 0430 03/13/14 0407  WBC 12.0* 8.6 7.5  NEUTROABS 9.3*  --   --   HGB 9.6* 8.1* 8.2*  HCT 31.7* 27.0* 27.3*  MCV 81.5 78.7 78.4  PLT 218 193 197   Cardiac Enzymes: No results for input(s): CKTOTAL, CKMB, CKMBINDEX, TROPONINI in the last 168 hours. BNP (last 3 results)  Recent Labs  03/23/13 1255 04/07/13 0223 07/04/13 1403  PROBNP >5000.0* 30408.0* 7985.0*   CBG:  Recent Labs Lab 03/11/14 2137  GLUCAP 204*    Recent Results (from the past 240 hour(s))  Clostridium Difficile by PCR     Status: Abnormal   Collection Time: 03/11/14 10:00 AM  Result Value Ref Range Status   C difficile by pcr POSITIVE (A)  NEGATIVE Final    Comment: CRITICAL RESULT CALLED TO, READ BACK BY AND VERIFIED WITH: KELLY K.,RN 01.09.16 1246 BY JONESJ   Urine culture     Status: None   Collection Time: 03/11/14 10:00 AM  Result Value Ref Range Status   Specimen Description URINE, RANDOM  Final   Special Requests NONE  Final   Colony Count   Final    25,000 COLONIES/ML Performed at [facility]    Culture   Final    Multiple bacterial morphotypes present, none predominant. Suggest appropriate recollection if clinically indicated. Performed at [facility]    Report Status 03/13/2014 FINAL  Final     Studies: No results found.  Scheduled Meds: . allopurinol  100 mg Oral Daily  . amiodarone  200 mg Oral Daily  . aspirin EC  81 mg Oral Daily  . carvedilol  3.125 mg Oral BID WC  . colchicine  0.3 mg Oral Daily  . digoxin  0.125 mg Oral QODAY  . furosemide  80 mg Intravenous BID  . isosorbide dinitrate  10 mg Oral TID  . metroNIDAZOLE  500 mg Oral 3 times per day  . predniSONE  40 mg Oral Q breakfast  . sodium chloride  3 mL Intravenous Q12H  . spironolactone  25 mg Oral Daily   Continuous Infusions:    Active Problems:   Acute on chronic combined systolic and diastolic congestive heart failure, NYHA class 4   UTI (lower urinary tract infection)   Gout   Cardiorenal syndrome with renal failure   Acute-on-chronic kidney injury   Enteritis due to Clostridium difficile    Time spent: 25 minutes.     Niel Hummer A  [facility] Pager [phone]. If 7PM-7AM, please contact night-coverage at [email or web address], password [facility] 03/14/2014, 8:13 AM  LOS: 4 days

## 2014-03-14 NOTE — Progress Notes (Signed)
Subjective:  Patient feels much improved.  States that his edema is diminished resolved in his lower extremities, and right upper extremity edema has also improved significantly.  He is now able to move his right hand.  Overall he feels well and dyspnea also has improved.  Objective:  Vital Signs in the last 24 hours: Temp:  [97.4 F (36.3 C)-97.7 F (36.5 C)] 97.5 F (36.4 C) (01/12 1634) Pulse Rate:  [58-71] 71 (01/12 1634) Resp:  [18-26] 18 (01/12 1634) BP: (102-117)/(59-78) 102/59 mmHg (01/12 1800) SpO2:  [100 %] 100 % (01/12 1634) Weight:  [108.364 kg (238 lb 14.4 oz)] 108.364 kg (238 lb 14.4 oz) (01/12 0434)   Filed Weights   03/12/14 0425 03/13/14 0500 03/14/14 0434  Weight: 112.855 kg (248 lb 12.8 oz) 112.129 kg (247 lb 3.2 oz) 108.364 kg (238 lb 14.4 oz)     Intake/Output from previous day: 01/11 0701 - 01/12 0700 In: 1203 [P.O.:1200; I.V.:3] Out: 2376 [Urine:2375; Stool:1]  Physical Exam:   General appearance: alert, cooperative, appears older than stated age, no distress and mildly obese Eyes: negative findings: lids and lashes normal Neck: no adenopathy, no carotid bruit, supple, symmetrical, trachea midline and JVD present Neck: JVP - normal, carotids 2+= without bruits Resp: bilateral faint expiratory rhonchi heard, left base has course crackles. Chest wall: no tenderness Cardio: regular rate and rhythm, S1, S2 widely split, no murmur, click, rub or gallop GI: soft, non-tender; bowel sounds normal; no masses,  no organomegaly Extremities: right upper extremity edema has improved significantly.  Bilateral  lower extremity edema has completely resolved.  Full range of motion movements.    Lab Results: BMP  Recent Labs  03/12/14 0430 03/13/14 0407 03/14/14 0428  NA 142 139 141  K 4.2 4.3 4.5  CL 109 106 104  CO2 26 23 22   GLUCOSE 130* 106* 101*  BUN 43* 49* 53*  CREATININE 2.27* 2.07* 2.18*  CALCIUM 8.2* 8.2* 8.5  GFRNONAA 29* 32* 30*  GFRAA 34*  38* 35*    CBC  Recent Labs Lab 03/10/14 1722  03/13/14 0407  WBC 12.0*  < > 7.5  RBC 3.89*  < > 3.48*  HGB 9.6*  < > 8.2*  HCT 31.7*  < > 27.3*  PLT 218  < > 197  MCV 81.5  < > 78.4  MCH 24.7*  < > 23.6*  MCHC 30.3  < > 30.0  RDW 18.5*  < > 18.2*  LYMPHSABS 1.4  --   --   MONOABS 1.3*  --   --   EOSABS 0.0  --   --   BASOSABS 0.0  --   --   < > = values in this interval not displayed.  HEMOGLOBIN A1C Lab Results  Component Value Date   HGBA1C 6.2* 09/12/2012   MPG 131* 09/12/2012    Cardiac Panel (last 3 results)  Recent Labs  09/12/13 0030 09/12/13 0312 09/12/13 0500  CKTOTAL 833*  --  706*  TROPONINI  --  <0.30  --     BNP (last 3 results)  Recent Labs  03/23/13 1255 04/07/13 0223 07/04/13 1403  PROBNP >5000.0* 30408.0* 7985.0*    TSH  Recent Labs  07/04/13 1403  TSH 1.640    CHOLESTEROL No results for input(s): CHOL in the last 8760 hours.  Hepatic Function Panel  Recent Labs  06/01/13 1316  09/12/13 0312 09/12/13 0500 03/14/14 0428  PROT 10.1*  < > 7.8 8.0 6.8  ALBUMIN 3.2*  < >  2.5* 2.5* 2.1*  AST 15  < > 26 25 12   ALT 11  < > 10 11 8   ALKPHOS 51  < > 82 88 57  BILITOT 0.4  < > 0.6 0.6 0.5  BILIDIR <0.2  --  0.3  --   --   IBILI NOT CALCULATED  --  0.3  --   --   < > = values in this interval not displayed.  Imaging: Imaging results have been reviewed  Cardiac Studies:  EKG: Sinus rhythm with first degree AV block, left axis deviation, left anterior physical block, right bundle branch block. Trifascicular block. LVH with repolarization abnormality, cannot exclude lateral ischemia.  Telemetry 03/12/2014: Brief episodes of nonsustained ventricular tachycardia.  Telemetry 03/14/2014: No further episodes of NSVT.  Echocardiogram 03/12/2014: Markedly dilated LV, grade 3 diastolic dysfunction, LVEF 15-20%.  Right ventricle moderately dilated and hypokinetic.  Mild mitral regurgitation.  LVH.  Assessment/Plan:  1. Acute on  chronic systolic and diastolic heart failure, involving both the right ventricle and left ventricle. Biventricular failure. 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 Heart failure symptoms markedly improved on 48 hours of dobutamine infusion.  2. H/O atrial flutter with Variable AV conduction. Patient is now in sinus rhythm on Amiodarone.  CHA2DS2-VASCScore: Risk Score 1, Yearly risk of stroke 1.3%. Recommendation: ASA /Anticoagulation   [place] Has Bled: Score 1.02. Estimated risk of major bleeding at 1 year with [place] 1.02-1.5% per year.  3.  Nonsustained ventricular tachycardia on 03/12/2014 without recurrence.  Patient not a candidate for ICD implantation.  Continue the amiodarone.  4. Acute kidney injury and acute renal failure, multifactorial etiology, UTI and urosepsis along with prerenal failure from both LV and RV failure. Serum creatinine improved, probably new baseline of 2.0-2.2.  3. COPD and history of prior tobacco use. Patient's presentation is consistent with biventricular failure and chronic cor pulmonale.  4. Acute gout involving the right upper extremity.  5. Anemia of chronic disease, no obvious GI bleed in the past.    6. Hypertension with hypertensive heart disease  7.  Severe protein malnutrition, failure to thrive in an adult.  8.  History of noncompliance with follow-up regarding both office visits and diet.  9.  Hyperglycemia  Recommendation: patient from cardiac standpoint has improved and stable.  Would recommend continued rehabilitation, again stressed to him regarding salt restriction.  Please see cardiac rehabilitation which again reinforces that patient is not particularly happy about salt restriction.  Patient is presently DNR  As per the long discussion I have had with patient and also with his knees who is the power of attorney for health.  I'll see the patient back in the office after discharge.  Please do  not hesitate to contact me if you needmy assistance further.  Laverda Page, M.D. 03/14/2014, 6:15 PM [facility], [place] Pager: [phone] Office: [phone] If no answer: [phone]

## 2014-03-14 NOTE — Progress Notes (Signed)
Physical Therapy Treatment Patient Details Name: Caleb Bauer MRN: [id number] DOB: 1950/10/20 Today's Date: 03/14/2014    History of Present Illness 64 y.o. male who presents to the ED with BLE leg pain and edema, onset last week, and getting worse over the past 1 week.  There is also RUE edema as well.     PT Comments    Pt demonstrated improved endurance and mobility during today's session. Pt without SOB during ambulation and had SpO2 of 95% at end of the session when measured from toe. Pt currently on RA. Will continue to benefit from acute PT to work on endurance, mobility and strengthening.    Follow Up Recommendations  Home health PT;Supervision/Assistance - 24 hour     Equipment Recommendations  None recommended by PT    Recommendations for Other Services       Precautions / Restrictions Precautions Precautions: Fall Restrictions Weight Bearing Restrictions: No    Mobility  Bed Mobility               General bed mobility comments: Pt in recliner at start of session; did not assess  Transfers Overall transfer level: Needs assistance Equipment used: Rolling walker (2 wheeled) Transfers: Sit to/from Stand Sit to Stand: Min guard         General transfer comment: Min guard for safety. No physical assist or cuing necessary.  Ambulation/Gait Ambulation/Gait assistance: Min guard Ambulation Distance (Feet): 180 Feet Assistive device: Rolling walker (2 wheeled) Gait Pattern/deviations: Step-through pattern;Decreased step length - right;Decreased step length - left;Decreased stride length   Gait velocity interpretation: Below normal speed for age/gender General Gait Details: Pt able to ambulate down to nurses station and back on RA with assist from RW. Pt leaning heavily on RW at times but appeared steady and had no loss of balance. Pt reported no SOB during ambulation. Unable to monitor SpO2 due to poor signal.    Stairs            Wheelchair  Mobility    Modified Rankin (Stroke Patients Only)       Balance Overall balance assessment: Needs assistance         Standing balance support: Bilateral upper extremity supported;During functional activity Standing balance-Leahy Scale: Fair Standing balance comment: Pt able to stand statically without support but requires use of RW for dynamic mobility                    Cognition Arousal/Alertness: Awake/alert Behavior During Therapy: WFL for tasks assessed/performed Overall Cognitive Status: Within Functional Limits for tasks assessed                      Exercises  Pt expressed importance of continuing exercises that were given yesterday.     General Comments        Pertinent Vitals/Pain Pain Assessment: No/denies pain    Home Living                      Prior Function            PT Goals (current goals can now be found in the care plan section) Progress towards PT goals: Progressing toward goals    Frequency  Min 3X/week    PT Plan Current plan remains appropriate    Co-evaluation             End of Session Equipment Utilized During Treatment: Gait belt Activity Tolerance: Patient tolerated treatment well  Patient left: in chair;with call bell/phone within reach     Time: 1123-1140 PT Time Calculation (min) (ACUTE ONLY): 17 min  Charges:  $Gait Training: 8-22 mins                    G CodesJearld Shines SPT 03/14/2014, 11:58 AM  Jearld Shines, SPT  Acute Rehabilitation [phone]

## 2014-03-14 NOTE — Progress Notes (Signed)
CARDIAC REHAB PHASE I   PRE:  Rate/Rhythm: 69 SR    BP: sitting 114/70    SaO2: 98 RA  MODE:  Ambulation: 270 ft   POST:  Rate/Rhythm: 76     BP: sitting 131/80     SaO2: 98 RA  Pt moving independently. Sts only slight SOB when asked. SaO2 98 RA after walk (with hot pack). Reviewed HF ed which pt sts he has all the papers and that he sees HF clinic. Could not st 2000 mg sodium restriction. I emphasized daily wts, taking meds and sodium restriction including what foods to buy and avoid. Pt is somewhat tangential and would benefit from reiteration.  Will f/u. [phone]  Josephina Shih [place] CES, ACSM 03/14/2014 3:02 PM

## 2014-03-15 LAB — CBC
HCT: 29.8 % — ABNORMAL LOW (ref 39.0–52.0)
Hemoglobin: 9.2 g/dL — ABNORMAL LOW (ref 13.0–17.0)
MCH: 24.2 pg — ABNORMAL LOW (ref 26.0–34.0)
MCHC: 30.9 g/dL (ref 30.0–36.0)
MCV: 78.4 fL (ref 78.0–100.0)
PLATELETS: 216 10*3/uL (ref 150–400)
RBC: 3.8 MIL/uL — ABNORMAL LOW (ref 4.22–5.81)
RDW: 18.2 % — AB (ref 11.5–15.5)
WBC: 6 10*3/uL (ref 4.0–10.5)

## 2014-03-15 LAB — BASIC METABOLIC PANEL
ANION GAP: 11 (ref 5–15)
BUN: 62 mg/dL — AB (ref 6–23)
CHLORIDE: 101 meq/L (ref 96–112)
CO2: 25 mmol/L (ref 19–32)
Calcium: 8.3 mg/dL — ABNORMAL LOW (ref 8.4–10.5)
Creatinine, Ser: 2.24 mg/dL — ABNORMAL HIGH (ref 0.50–1.35)
GFR calc non Af Amer: 29 mL/min — ABNORMAL LOW (ref >90)
GFR, EST AFRICAN AMERICAN: 34 mL/min — AB (ref >90)
Glucose, Bld: 155 mg/dL — ABNORMAL HIGH (ref 70–99)
POTASSIUM: 4.1 mmol/L (ref 3.5–5.1)
Sodium: 137 mmol/L (ref 135–145)

## 2014-03-15 LAB — MAGNESIUM: MAGNESIUM: 1.8 mg/dL (ref 1.5–2.5)

## 2014-03-15 MED ORDER — FUROSEMIDE 80 MG PO TABS
100.0000 mg | ORAL_TABLET | Freq: Two times a day (BID) | ORAL | Status: DC
  Administered 2014-03-15 – 2014-03-16 (×2): 100 mg via ORAL
  Filled 2014-03-15 (×4): qty 1

## 2014-03-15 MED ORDER — PREDNISONE 20 MG PO TABS
30.0000 mg | ORAL_TABLET | Freq: Every day | ORAL | Status: DC
  Administered 2014-03-16: 30 mg via ORAL
  Filled 2014-03-15 (×2): qty 1

## 2014-03-15 NOTE — Progress Notes (Addendum)
Occupational Therapy Treatment Patient Details Name: Caleb Bauer MRN: [id number] DOB: Jul 14, 1950 Today's Date: 03/15/2014    History of present illness 64 y.o. male who presents to the ED with BLE leg pain and edema, onset last week, and getting worse over the past 1 week.  There is also RUE edema as well.    OT comments  Pt progressing. Education provided in session. Continue to recommend [place] upon d/c.    Follow Up Recommendations  Home health OT;Supervision - Intermittent    Equipment Recommendations  Tub/shower bench    Recommendations for Other Services      Precautions / Restrictions Precautions Precautions: Fall Restrictions Weight Bearing Restrictions: No       Mobility Bed Mobility               General bed mobility comments: not assessed  Transfers Overall transfer level: Needs assistance Equipment used: Rolling walker (2 wheeled) Transfers: Sit to/from Stand Sit to Stand: Min guard;Supervision         General transfer comment: cues for hand placement.    Balance                                   ADL Overall ADL's : Needs assistance/impaired     Grooming: Wash/dry face;Applying deodorant;Set up;Supervision/safety;Standing   Upper Body Bathing: Set up;Supervision/ safety;Standing   Lower Body Bathing: Set up;Supervison/ safety;Sit to/from stand   Upper Body Dressing : Set up;Supervision/safety;Sitting;Standing   Lower Body Dressing: Set up;Supervision/safety;Sitting/lateral leans (socks)   Toilet Transfer: Supervision/safety;Ambulation;RW;BSC           Functional mobility during ADLs: Supervision/safety;Rolling walker-ambulated in hallway  General ADL Comments: Educated on safety such as safe shoewear, use of bag on walker, sitting for LB ADLs, rugs/clutter/items on floor. Educated on tub transfer techniques and DME. Recommended pt wait for home health to practice tub transfer with him at home before he does it. Cues  for deep breathing technique as he was out of breath after reaching for LB ADLs and took a short break. Pt asking therapist what kind of lotion to use for his dry skin. Suggested long sponge for UB bathing as he states it is difficult to reach back.      Vision                     Perception     Praxis      Cognition  Awake/Alert Behavior During Therapy: WFL for tasks assessed/performed Overall Cognitive Status: Within Functional Limits for tasks assessed                       Extremity/Trunk Assessment               Exercises     Shoulder Instructions       General Comments      Pertinent Vitals/ Pain       Pain Assessment: No/denies pain; O2 at 90-91% on RA at end of session-nurse notified.  Home Living                                          Prior Functioning/Environment              Frequency Min 2X/week     Progress Toward Goals  OT  Goals(current goals can now be found in the care plan section)  Progress towards OT goals: Progressing toward goals  Acute Rehab OT Goals Patient Stated Goal: not stated OT Goal Formulation: With patient Time For Goal Achievement: 03/20/14 Potential to Achieve Goals: Good ADL Goals Pt Will Perform Grooming: with modified independence;standing Pt Will Perform Lower Body Bathing: with modified independence;sit to/from stand Pt Will Perform Lower Body Dressing: sit to/from stand;with modified independence Pt Will Transfer to Toilet: with modified independence;ambulating Pt Will Perform Tub/Shower Transfer: Tub transfer;with min guard assist;ambulating;rolling walker;shower seat  Plan Discharge plan remains appropriate    Co-evaluation                 End of Session Equipment Utilized During Treatment: Gait belt;Rolling walker   Activity Tolerance Patient tolerated treatment well   Patient Left in chair;with call bell/phone within reach   Nurse Communication Other  (comment) (O2)        Time: 1828-8337 OT Time Calculation (min): 29 min  Charges: OT General Charges $OT Visit: 1 Procedure OT Treatments $Self Care/Home Management : 8-22 mins $Therapeutic Activity: 8-22 mins   Benito Mccreedy OTR/L [phone] 03/15/2014, 11:41 AM

## 2014-03-15 NOTE — Progress Notes (Signed)
[facility] PROGRESS NOTE  Caleb Bauer FYB:[id number] DOB: [birth year] DOA: 03/10/2014 PCP: Laverda Page, MD  Caleb Bauer is a 64 y.o. male who presents to the ED with BLE leg pain and edema, onset last week, and getting worse over the past 1 week. There is also RUE edema as well. This occurs in the context of chronic CHF with known cardio-renal syndrome and baseline creatinine that appears to be elevating over the past year.  Seen by cardiology and diuresis done.   Assessment/Plan:  1-Acute on chronic combine Heart failure. Last EF 25 % by ECHO 03-10-2013.  - lasix 80 mg IV BID- change to PO regimine- start wit 100 BID PO -Continue with Isosorbide (holder parameters for BP), digoxin, coreg.  -Received Dobutamine from 01-09 until 1-11.  -Cardiology consulted. Appreciate Dr Einar Gip help.  -urine out put : 2.L (1-11) -ECHO with lower Ef 15 to 20 %.  -Weight continues to decrease   2-Leukocytosis; from C diff.    3-Chronic renal failure, stage III;  Cr per records 1.5 to 2.2. Monitor on lasix,   4-UTI; Received  ceftriaxone for 3 days. Urine culture with multiple bacterial morphotype only 25,000 colonies.  - ceftriaxone discontinue 1-11  5-Gout flare: pain right had with swelling. Continue with colchicine and prednisone. Pain has improved. Will taper prednisone. Change to 40 mg starting 1-12.   6-Lower extremity edema, worse on the left:  Doppler: negative.   7-Screening for HIV negative.   8-A flutter; continue with amiodarone.   9-C diff colitis: Diarrhea; c diff came back positive. Started  Flagyl 1-09. Diarrhea improving. 14 days total  10-SVT; amiodarone 200 mg daily.   11-Anemia; hb stable at 8. Will check anemia panel.   Code Status: DNR Family Communication: care discussed with patient,. Disposition Plan: home in AM? Home when medical stable, after improvement HF.    Consultants:  cardiology  Procedures:  Renal US. No evidence of  hydronephrosis. Two small left renal cysts seen.  Antibiotics:  Ceftriaxone 1-08---1-11  HPI/Subjective: No overnight events  Objective: Filed Vitals:   03/15/14 1028  BP: 116/72  Pulse: 65  Temp:   Resp:     Intake/Output Summary (Last 24 hours) at 03/15/14 1036 Last data filed at 03/15/14 0905  Gross per 24 hour  Intake    943 ml  Output   2325 ml  Net  -1382 ml   Filed Weights   03/13/14 0500 03/14/14 0434 03/15/14 0500  Weight: 112.129 kg (247 lb 3.2 oz) 108.364 kg (238 lb 14.4 oz) 100.653 kg (221 lb 14.4 oz)    Exam:   General:  Alert in no distress.   Cardiovascular:S 1, S 2 RRR  Respiratory: Crackles bases, normal respiratory effort.   Abdomen: BS present, distended.   Musculoskeletal: bilateral LE edema decreased. Right hand with decrease edema, able to move right arm.   Data Reviewed: Basic Metabolic Panel:  Recent Labs Lab 03/11/14 0501 03/11/14 2123 03/12/14 0430 03/13/14 0407 03/14/14 0428 03/15/14 0252  NA 139  --  142 139 141 137  K 4.3  --  4.2 4.3 4.5 4.1  CL 109  --  109 106 104 101  CO2 23  --  26 23 22 25   GLUCOSE 100*  --  130* 106* 101* 155*  BUN 38*  --  43* 49* 53* 62*  CREATININE 2.38*  --  2.27* 2.07* 2.18* 2.24*  CALCIUM 8.1*  --  8.2* 8.2* 8.5 8.3*  MG  --  2.0  --   --   --  1.8   Liver Function Tests:  Recent Labs Lab 03/14/14 0428  AST 12  ALT 8  ALKPHOS 57  BILITOT 0.5  PROT 6.8  ALBUMIN 2.1*   No results for input(s): LIPASE, AMYLASE in the last 168 hours. No results for input(s): AMMONIA in the last 168 hours. CBC:  Recent Labs Lab 03/10/14 1722 03/12/14 0430 03/13/14 0407 03/15/14 0252  WBC 12.0* 8.6 7.5 6.0  NEUTROABS 9.3*  --   --   --   HGB 9.6* 8.1* 8.2* 9.2*  HCT 31.7* 27.0* 27.3* 29.8*  MCV 81.5 78.7 78.4 78.4  PLT 218 193 197 216   Cardiac Enzymes: No results for input(s): CKTOTAL, CKMB, CKMBINDEX, TROPONINI in the last 168 hours. BNP (last 3 results)  Recent Labs   03/23/13 1255 04/07/13 0223 07/04/13 1403  PROBNP >5000.0* 30408.0* 7985.0*   CBG:  Recent Labs Lab 03/11/14 2137  GLUCAP 204*    Recent Results (from the past 240 hour(s))  Clostridium Difficile by PCR     Status: Abnormal   Collection Time: 03/11/14 10:00 AM  Result Value Ref Range Status   C difficile by pcr POSITIVE (A) NEGATIVE Final    Comment: CRITICAL RESULT CALLED TO, READ BACK BY AND VERIFIED WITH: KELLY K.,RN 01.09.16 1246 BY JONESJ   Urine culture     Status: None   Collection Time: 03/11/14 10:00 AM  Result Value Ref Range Status   Specimen Description URINE, RANDOM  Final   Special Requests NONE  Final   Colony Count   Final    25,000 COLONIES/ML Performed at [facility]    Culture   Final    Multiple bacterial morphotypes present, none predominant. Suggest appropriate recollection if clinically indicated. Performed at [facility]    Report Status 03/13/2014 FINAL  Final     Studies: No results found.  Scheduled Meds: . allopurinol  100 mg Oral Daily  . amiodarone  200 mg Oral Daily  . aspirin EC  81 mg Oral Daily  . carvedilol  3.125 mg Oral BID WC  . colchicine  0.3 mg Oral Daily  . digoxin  0.125 mg Oral QODAY  . furosemide  80 mg Intravenous BID  . isosorbide dinitrate  10 mg Oral TID  . metroNIDAZOLE  500 mg Oral 3 times per day  . predniSONE  40 mg Oral Q breakfast  . sodium chloride  3 mL Intravenous Q12H  . spironolactone  25 mg Oral Daily   Continuous Infusions:    Active Problems:   Acute on chronic combined systolic and diastolic congestive heart failure, NYHA class 4   UTI (lower urinary tract infection)   Gout   Cardiorenal syndrome with renal failure   Acute-on-chronic kidney injury   Enteritis due to Clostridium difficile    Time spent: 25 minutes.     Eulogio Bear  [facility] Pager [phone]. If 7PM-7AM, please contact night-coverage at [email or web address], password [facility] 03/15/2014, 10:36 AM   LOS: 5 days

## 2014-03-15 NOTE — Clinical Documentation Improvement (Signed)
  Query #1  Please provide Clinical Indicators (by comparing previous BUN/Cr/ GFR results to the current admission results) for the diagnosis of "Acute Kidney Injury" (AKI) documented in the current medical record.  Query #2  Please clarify and document if the diagnosis of "UTI" was confirmed or ruled out.  Query #3  "Urosepsis" documented in the Cardiology Consult and Cards progress notes this admission.  Please clarify and document if the diagnosis of "Urosepsis" was or was not applicable to this admission.   Thank You, Erling Conte ,RN Clinical Documentation Specialist:  [phone] [place] [facility]

## 2014-03-15 NOTE — Progress Notes (Signed)
CARDIAC REHAB PHASE I   PRE:  Rate/Rhythm: 69 SR    BP: sitting 120/80    SaO2: 98 RA  MODE:  Ambulation: 350 ft   POST:  Rate/Rhythm: 88 SR    BP: sitting 115/87     SaO2: 98 RA  Tolerated well. Very talkative, able to talk entire walk. Pt understanding low sodium more. Able to st 2000 mg. Discussed making food from scratch and how to season without sodium. Pt has RW at home. Will f/u. This was second walk today. [phone]   [name] CES, ACSM 03/15/2014 2:43 PM

## 2014-03-16 ENCOUNTER — Encounter (HOSPITAL_COMMUNITY): Payer: Self-pay | Admitting: Cardiology

## 2014-03-16 DIAGNOSIS — M1000 Idiopathic gout, unspecified site: Secondary | ICD-10-CM

## 2014-03-16 LAB — BASIC METABOLIC PANEL
Anion gap: 17 — ABNORMAL HIGH (ref 5–15)
BUN: 61 mg/dL — ABNORMAL HIGH (ref 6–23)
CO2: 29 mmol/L (ref 19–32)
Calcium: 9.3 mg/dL (ref 8.4–10.5)
Chloride: 98 mEq/L (ref 96–112)
Creatinine, Ser: 2.06 mg/dL — ABNORMAL HIGH (ref 0.50–1.35)
GFR calc non Af Amer: 33 mL/min — ABNORMAL LOW (ref >90)
GFR, EST AFRICAN AMERICAN: 38 mL/min — AB (ref >90)
Glucose, Bld: 88 mg/dL (ref 70–99)
Potassium: 4.2 mmol/L (ref 3.5–5.1)
SODIUM: 144 mmol/L (ref 135–145)

## 2014-03-16 LAB — CBC
HCT: 33.6 % — ABNORMAL LOW (ref 39.0–52.0)
Hemoglobin: 10.4 g/dL — ABNORMAL LOW (ref 13.0–17.0)
MCH: 24.1 pg — AB (ref 26.0–34.0)
MCHC: 31 g/dL (ref 30.0–36.0)
MCV: 78 fL (ref 78.0–100.0)
Platelets: 260 10*3/uL (ref 150–400)
RBC: 4.31 MIL/uL (ref 4.22–5.81)
RDW: 18.3 % — ABNORMAL HIGH (ref 11.5–15.5)
WBC: 6.2 10*3/uL (ref 4.0–10.5)

## 2014-03-16 MED ORDER — COLCHICINE 0.6 MG PO TABS: 0.6000 mg | ORAL_TABLET | Freq: Two times a day (BID) | ORAL | Status: DC | PRN

## 2014-03-16 MED ORDER — METRONIDAZOLE 500 MG PO TABS: 500.0000 mg | ORAL_TABLET | Freq: Three times a day (TID) | ORAL | Status: DC

## 2014-03-16 MED ORDER — TORSEMIDE 20 MG PO TABS: 40.0000 mg | ORAL_TABLET | ORAL | Status: DC

## 2014-03-16 MED ORDER — HYDRALAZINE HCL 10 MG PO TABS: 10.0000 mg | ORAL_TABLET | Freq: Three times a day (TID) | ORAL | Status: DC

## 2014-03-16 NOTE — Progress Notes (Signed)
Physical Therapy Treatment Patient Details Name: Caleb Bauer MRN: [id number] DOB: 1950/03/31 Today's Date: 03/16/2014    History of Present Illness 64 y.o. male who presents to the ED with BLE leg pain and edema, onset last week, and getting worse over the past 1 week.  There is also RUE edema as well.     PT Comments    Pt able to ambulate farther during today's session and had no complaints of dyspnea during ambulation. Pt continues to have difficulty with multitasking during ambulation and has to stop walking when attending to distractions. Pt will continue to benefit from acute PT focused on safety during mobility and increased endurance.    Follow Up Recommendations  Home health PT;Supervision/Assistance - 24 hour     Equipment Recommendations  None recommended by PT    Recommendations for Other Services       Precautions / Restrictions Precautions Precautions: Fall Restrictions Weight Bearing Restrictions: No    Mobility  Bed Mobility               General bed mobility comments: Pt sitting in chair at start of session; not assessed  Transfers Overall transfer level: Needs assistance Equipment used: Rolling walker (2 wheeled) Transfers: Sit to/from Stand Sit to Stand: Min guard         General transfer comment: Pt requiring cues to scoot forward in chair to give better mechanical advantage to stand. Pt with safe transfer and no loss of balance.   Ambulation/Gait Ambulation/Gait assistance: Min guard Ambulation Distance (Feet): 360 Feet Assistive device: Rolling walker (2 wheeled) Gait Pattern/deviations: Step-through pattern;Decreased step length - right;Decreased step length - left;Decreased stride length   Gait velocity interpretation: Below normal speed for age/gender General Gait Details: Pt able to ambualte down to nurse station and around middle loop without loss of a balance or complaint of SOB. Pt with difficulty multitasking during ambulation  and is able to talk but stops or slows down when turning head.    Stairs            Wheelchair Mobility    Modified Rankin (Stroke Patients Only)       Balance Overall balance assessment: Needs assistance         Standing balance support: Bilateral upper extremity supported;During functional activity Standing balance-Leahy Scale: Fair Standing balance comment: Pt able to stand without support but requiring RW for safe dynamic standing activities.                     Cognition Arousal/Alertness: Awake/alert Behavior During Therapy: WFL for tasks assessed/performed Overall Cognitive Status: Within Functional Limits for tasks assessed                      Exercises      General Comments        Pertinent Vitals/Pain Pain Assessment: No/denies pain    Home Living                      Prior Function            PT Goals (current goals can now be found in the care plan section) Progress towards PT goals: Progressing toward goals    Frequency  Min 3X/week    PT Plan Current plan remains appropriate    Co-evaluation             End of Session Equipment Utilized During Treatment: Gait belt Activity Tolerance: Patient  tolerated treatment well Patient left: in chair;with call bell/phone within reach;Other (comment) (MD in room)     Time: 7939-6886 PT Time Calculation (min) (ACUTE ONLY): 18 min  Charges:  $Gait Training: 8-22 mins                    G CodesJearld Shines SPT 03/16/2014, 11:12 AM  Jearld Shines, SPT  Acute Rehabilitation [phone]

## 2014-03-16 NOTE — Discharge Summary (Signed)
Physician Discharge Summary  Caleb Bauer MBW:[id number] DOB: [birth year] DOA: 03/10/2014  PCP: Laverda Page, MD  Admit date: 03/10/2014 Discharge date: 03/16/2014  Time spent: greater than 30 minutes  Recommendations for Outpatient Follow-up:  1. Home health RN, PT, OT, aide arranged  Discharge Diagnoses:  Active Problems:   Acute on chronic combined systolic and diastolic congestive heart failure, NYHA class 4   UTI (lower urinary tract infection)   Gout Chronic kidney disease   Enteritis due to Clostridium difficile   Discharge Condition: stable  Filed Weights   03/14/14 0434 03/15/14 0500 03/16/14 0346  Weight: 108.364 kg (238 lb 14.4 oz) 100.653 kg (221 lb 14.4 oz) 101.334 kg (223 lb 6.4 oz)    History of present illness:  64 y.o. male who presents to the ED with BLE leg pain and edema, onset last week, and getting worse over the past 1 week. There is also RUE edema as well. This occurs in the context of chronic CHF with known cardio-renal syndrome and baseline creatinine that appears to be elevating over the past year.  Hospital Course:   1-Acute on chronic combine Heart failure. Last EF 25 % by ECHO 03-10-2013.  Diuresed with iv lasix for several days. Cardiology consulted -Continue with Isosorbide (holder parameters for BP), digoxin, coreg.  -ECHO with lower Ef 15 to 20 %.  Weight on admission 240 lbs. At discharge 223.  3-Chronic renal failure, stage III;  Cr per records 1.5 to 2.2.  4-UTI; Received ceftriaxone for 3 days. Urine culture with multiple bacterial morphotype only 25,000 colonies.  - ceftriaxone discontinued 1-11  5-Gout flare: involving right hand. Improved with prednisone and colchicine  6-Lower extremity edema, worse on the left: Doppler: negative.  Secondary to CHF  7-A flutter; on amiodarone   8 C diff colitis: Diarrhea; c diff came back positive. Started Flagyl 1-09. Diarrhea improving. 14 days total  9-SVT; amiodarone 200 mg  daily.   10-Anemia; hb stable at 8. Will check anemia panel.   Procedures:  none  Consultations:  cardiology  Discharge Exam: Filed Vitals:   03/16/14 0346  BP: 119/73  Pulse: 68  Temp: 97.5 F (36.4 C)  Resp: 16    General: comfortable Cardiovascular: RRR Respiratory: CTA  Discharge Instructions   Discharge Instructions    (HEART FAILURE PATIENTS) Call MD:  Anytime you have any of the following symptoms: 1) 3 pound weight gain in 24 hours or 5 pounds in 1 week 2) shortness of breath, with or without a dry hacking cough 3) swelling in the hands, feet or stomach 4) if you have to sleep on extra pillows at night in order to breathe.    Complete by:  As directed      Activity as tolerated - No restrictions    Complete by:  As directed      Contraindication to ACEI at discharge    Complete by:  As directed      Diet - low sodium heart healthy    Complete by:  As directed      Heart Failure patients record your daily weight using the same scale at the same time of day    Complete by:  As directed           Current Discharge Medication List    START taking these medications   Details  colchicine 0.6 MG tablet Take 1 tablet (0.6 mg total) by mouth 2 (two) times daily as needed (gout flair). Qty: 30 tablet, Refills:  0    hydrALAZINE (APRESOLINE) 10 MG tablet Take 1 tablet (10 mg total) by mouth 3 (three) times daily. Qty: 90 tablet, Refills: 1    metroNIDAZOLE (FLAGYL) 500 MG tablet Take 1 tablet (500 mg total) by mouth every 8 (eight) hours. Until gone Qty: 27 tablet, Refills: 0      CONTINUE these medications which have CHANGED   Details  torsemide (DEMADEX) 20 MG tablet Take 2 tablets (40 mg total) by mouth 2 (two) times a week. Every Monday and Friday, Hold for wt less than 193 lb Qty: 180 tablet, Refills: 0      CONTINUE these medications which have NOT CHANGED   Details  allopurinol (ZYLOPRIM) 100 MG tablet Take 1 tablet (100 mg total) by mouth  daily. Qty: 30 tablet, Refills: 6    amiodarone (PACERONE) 200 MG tablet Take 1 tablet (200 mg total) by mouth daily. Qty: 90 tablet, Refills: 3    aspirin EC 81 MG tablet Take 81 mg by mouth daily.    carvedilol (COREG) 3.125 MG tablet Take 1 tablet (3.125 mg total) by mouth 2 (two) times daily. Qty: 180 tablet, Refills: 3    digoxin (LANOXIN) 0.125 MG tablet TAKE ONE TABLET BY MOUTH EVERY OTHER DAY Qty: 15 tablet, Refills: 2    isosorbide dinitrate (ISORDIL) 10 MG tablet Take 10 mg by mouth 3 (three) times daily.    spironolactone (ALDACTONE) 25 MG tablet Take 25 mg by mouth daily.      STOP taking these medications     lisinopril (PRINIVIL,ZESTRIL) 2.5 MG tablet        Allergies  Allergen Reactions  . Other Nausea And Vomiting    Patient stated drug is "kerein"   Follow-up Information    Follow up with [facility].   Why:  Registered Nurse   Contact information:   [address] [place] [address] [phone]       Follow up with Laverda Page, MD.   Specialty:  Cardiology   Contact information:   [place] [address] [phone]       Follow up with your primary care provider.       The results of significant diagnostics from this hospitalization (including imaging, microbiology, ancillary and laboratory) are listed below for reference.    Significant Diagnostic Studies: Dg Chest 2 View  03/10/2014   CLINICAL DATA:  Bilateral feet swelling, cough, fever x1 week  EXAM: CHEST  2 VIEW  COMPARISON:  09/11/2013  FINDINGS: Cardiomegaly with pulmonary vascular congestion and suspected mild interstitial edema.  Left lung base is obscured, favored to reflect a small left pleural effusion with left lower lobe atelectasis. Left lower lobe pneumonia is not excluded.  No pneumothorax.  Degenerative changes of the visualized thoracolumbar spine.  IMPRESSION: Cardiomegaly with mild interstitial edema.  Left lung  base is obscured, favored to reflect a small left pleural effusion with left lower lobe atelectasis. Left lower lobe pneumonia is not excluded.   Electronically Signed   By: Julian Hy M.D.   On: 03/10/2014 18:12   US Renal  03/11/2014   CLINICAL DATA:  Chronic renal disease.  Initial encounter.  EXAM: RENAL/URINARY TRACT ULTRASOUND COMPLETE  COMPARISON:  Renal ultrasound performed 11/23/2012, and CT of the abdomen and pelvis performed 10/08/2012  FINDINGS: Right Kidney:  Length: 13.1 cm. Echogenicity within normal limits. No mass or hydronephrosis visualized.  Left Kidney:  Length: 13.1 cm. Echogenicity within normal limits.  Two small cysts are seen at the interpole region of the left kidney, measuring 1.8 cm and 1.4 cm. No hydronephrosis visualized.  Bladder:  Appears normal for degree of bladder distention.  IMPRESSION: No evidence of hydronephrosis.  Two small left renal cysts seen.   Electronically Signed   By: Garald Balding M.D.   On: 03/11/2014 07:32   Echo: Left ventricle: The cavity size was severely dilated. There was mild concentric hypertrophy. Systolic function was severely reduced. The estimated ejection fraction was in the range of 15% to 20%. Diffuse hypokinesis. There was a reduced contribution of atrial contraction to ventricular filling, due to increased ventricular diastolic pressure or atrial contractile dysfunction. Doppler parameters are consistent with a reversible restrictive pattern, indicative of decreased left ventricular diastolic compliance and/or increased left atrial pressure (grade 3 diastolic dysfunction). - Ventricular septum: These changes are consistent with a left bundle branch block. - Mitral valve: There was mild regurgitation. - Left atrium: The atrium was moderately to severely dilated. - Right ventricle: The cavity size was moderately dilated. Systolic function was moderately reduced. - Atrial septum: No defect or patent  foramen ovale was identified. - Tricuspid valve: Moderately dilated annulus. - Inferior vena cava: The vessel was dilated. The respirophasic diameter changes were blunted (< 50%), consistent with elevated central venous pressure. - Pericardium, extracardiac: A small pericardial effusion was identified posterior to the heart. The fluid had no internal echoes.  Impressions:  - Dilated cardiomyopathy.  Doppler legs No evidence of deep vein thrombosis involving the right lower extremity and left lower extremity. - No evidence of Baker&'s cyst on the right or left.  EKG Sinus rhythm Left atrial enlargement RBBB and LAFB  Microbiology: Recent Results (from the past 240 hour(s))  Clostridium Difficile by PCR     Status: Abnormal   Collection Time: 03/11/14 10:00 AM  Result Value Ref Range Status   C difficile by pcr POSITIVE (A) NEGATIVE Final    Comment: CRITICAL RESULT CALLED TO, READ BACK BY AND VERIFIED WITH: KELLY K.,RN 01.09.16 1246 BY JONESJ   Urine culture     Status: None   Collection Time: 03/11/14 10:00 AM  Result Value Ref Range Status   Specimen Description URINE, RANDOM  Final   Special Requests NONE  Final   Colony Count   Final    25,000 COLONIES/ML Performed at [facility]    Culture   Final    Multiple bacterial morphotypes present, none predominant. Suggest appropriate recollection if clinically indicated. Performed at [facility]    Report Status 03/13/2014 FINAL  Final     Labs: Basic Metabolic Panel:  Recent Labs Lab 03/11/14 2123 03/12/14 0430 03/13/14 0407 03/14/14 0428 03/15/14 0252 03/16/14 0745  NA  --  142 139 141 137 144  K  --  4.2 4.3 4.5 4.1 4.2  CL  --  109 106 104 101 98  CO2  --  26 23 22 25 29   GLUCOSE  --  130* 106* 101* 155* 88  BUN  --  43* 49* 53* 62* 61*  CREATININE  --  2.27* 2.07* 2.18* 2.24* 2.06*  CALCIUM  --  8.2* 8.2* 8.5 8.3* 9.3  MG 2.0  --   --   --  1.8  --    Liver Function  Tests:  Recent Labs Lab 03/14/14 0428  AST 12  ALT 8  ALKPHOS 57  BILITOT 0.5  PROT 6.8  ALBUMIN 2.1*   No results for input(s):  LIPASE, AMYLASE in the last 168 hours. No results for input(s): AMMONIA in the last 168 hours. CBC:  Recent Labs Lab 03/10/14 1722 03/12/14 0430 03/13/14 0407 03/15/14 0252 03/16/14 0745  WBC 12.0* 8.6 7.5 6.0 6.2  NEUTROABS 9.3*  --   --   --   --   HGB 9.6* 8.1* 8.2* 9.2* 10.4*  HCT 31.7* 27.0* 27.3* 29.8* 33.6*  MCV 81.5 78.7 78.4 78.4 78.0  PLT 218 193 197 216 260   Cardiac Enzymes: No results for input(s): CKTOTAL, CKMB, CKMBINDEX, TROPONINI in the last 168 hours. BNP: BNP (last 3 results)  Recent Labs  03/23/13 1255 04/07/13 0223 07/04/13 1403  PROBNP >5000.0* 30408.0* 7985.0*   CBG:  Recent Labs Lab 03/11/14 2137  GLUCAP 204*       Signed:  Nitro L  [facility] 03/16/2014, 11:20 AM

## 2014-03-16 NOTE — Discharge Instructions (Signed)
Edema  Edema is an abnormal buildup of fluids. It is more common in your legs and thighs. Painless swelling of the feet and ankles is more likely as a person ages. It also is common in looser skin, like around your eyes.  HOME CARE   · Keep the affected body part above the level of the heart while lying down.  · Do not sit still or stand for a long time.  · Do not put anything right under your knees when you lie down.  · Do not wear tight clothes on your upper legs.  · Exercise your legs to help the puffiness (swelling) go down.  · Wear elastic bandages or support stockings as told by your doctor.  · A low-salt diet may help lessen the puffiness.  · Only take medicine as told by your doctor.  GET HELP IF:  · Treatment is not working.  · You have heart, liver, or kidney disease and notice that your skin looks puffy or shiny.  · You have puffiness in your legs that does not get better when you raise your legs.  · You have sudden weight gain for no reason.  GET HELP RIGHT AWAY IF:   · You have shortness of breath or chest pain.  · You cannot breathe when you lie down.  · You have pain, redness, or warmth in the areas that are puffy.  · You have heart, liver, or kidney disease and get edema all of a sudden.  · You have a fever and your symptoms get worse all of a sudden.  MAKE SURE YOU:   · Understand these instructions.  · Will watch your condition.  · Will get help right away if you are not doing well or get worse.  Document Released: 08/06/2007 Document Revised: 02/22/2013 Document Reviewed: 12/10/2012  Exit[employer]® Patient Information ©2015 [employer]. This information is not intended to replace advice given to you by your health care provider. Make sure you discuss any questions you have with your health care provider.

## 2014-06-13 IMAGING — CR DG CHEST 2V
2 series · 2 of 2 positions shown · non-contrast
Comparison: 03/23/2013

CLINICAL DATA: Chest pain.

EXAM:
CHEST  2 VIEW

[x chest ap]
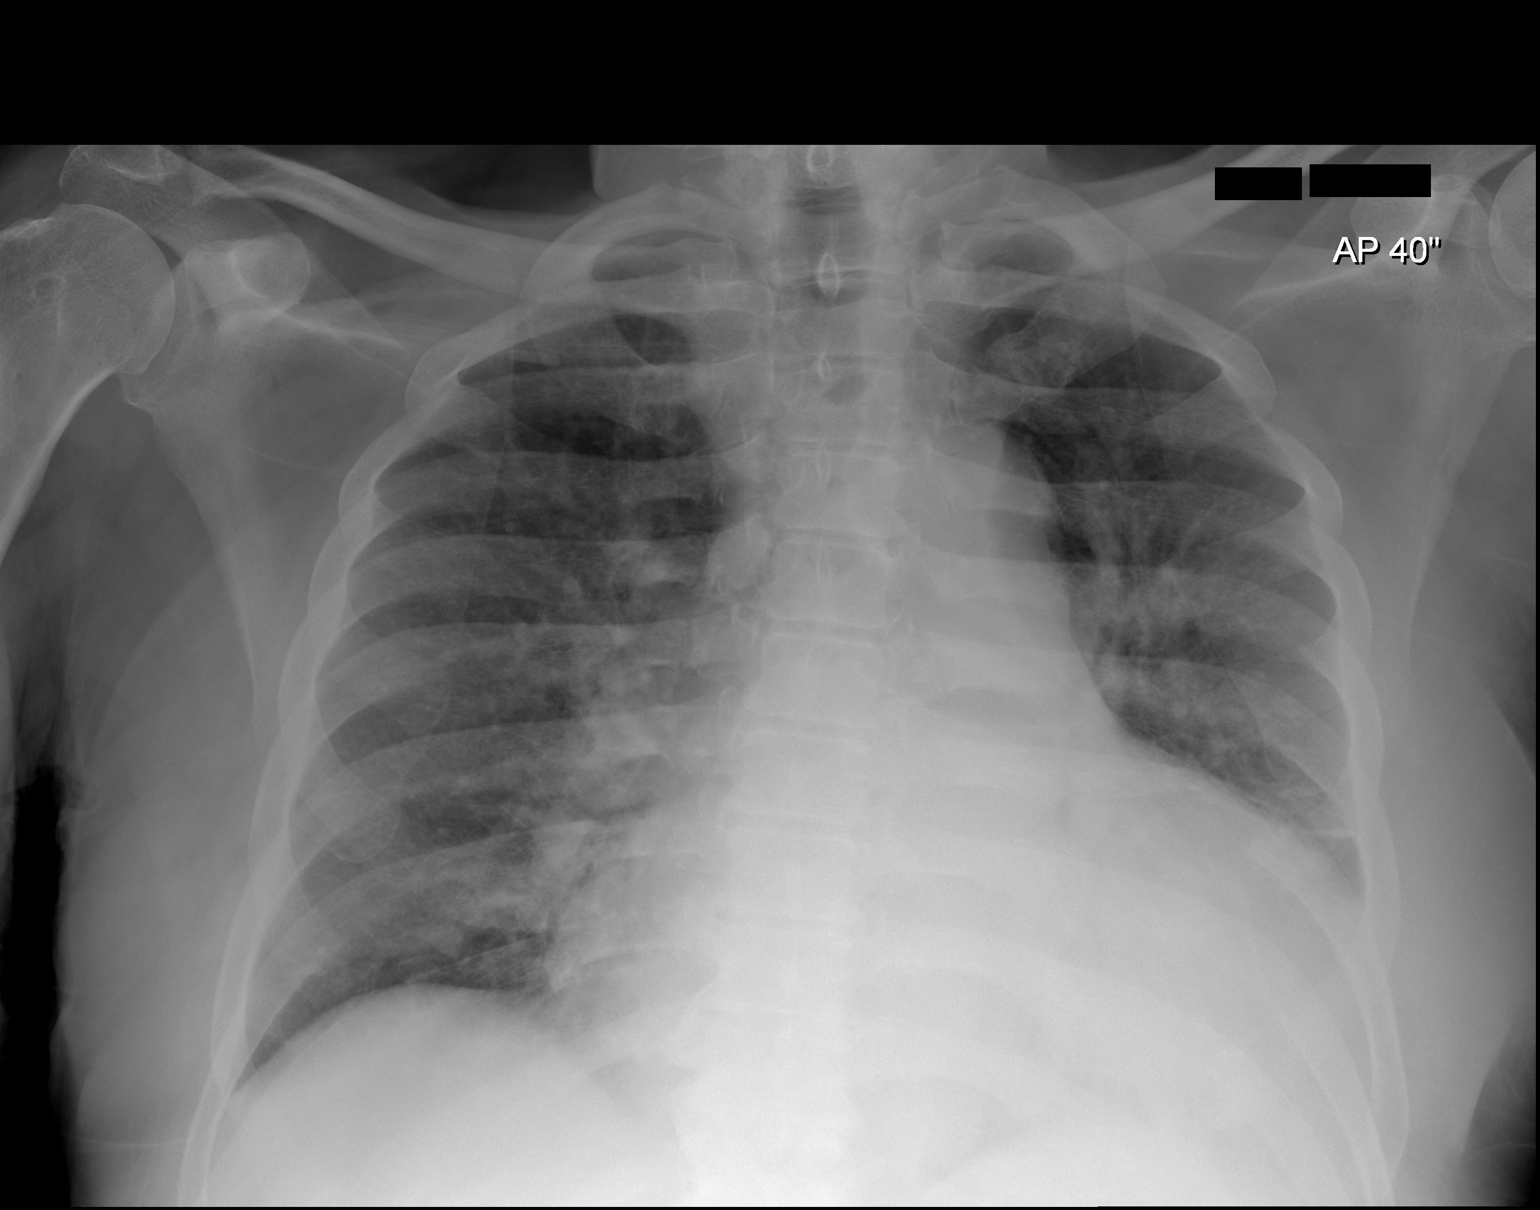

[w chest lat]
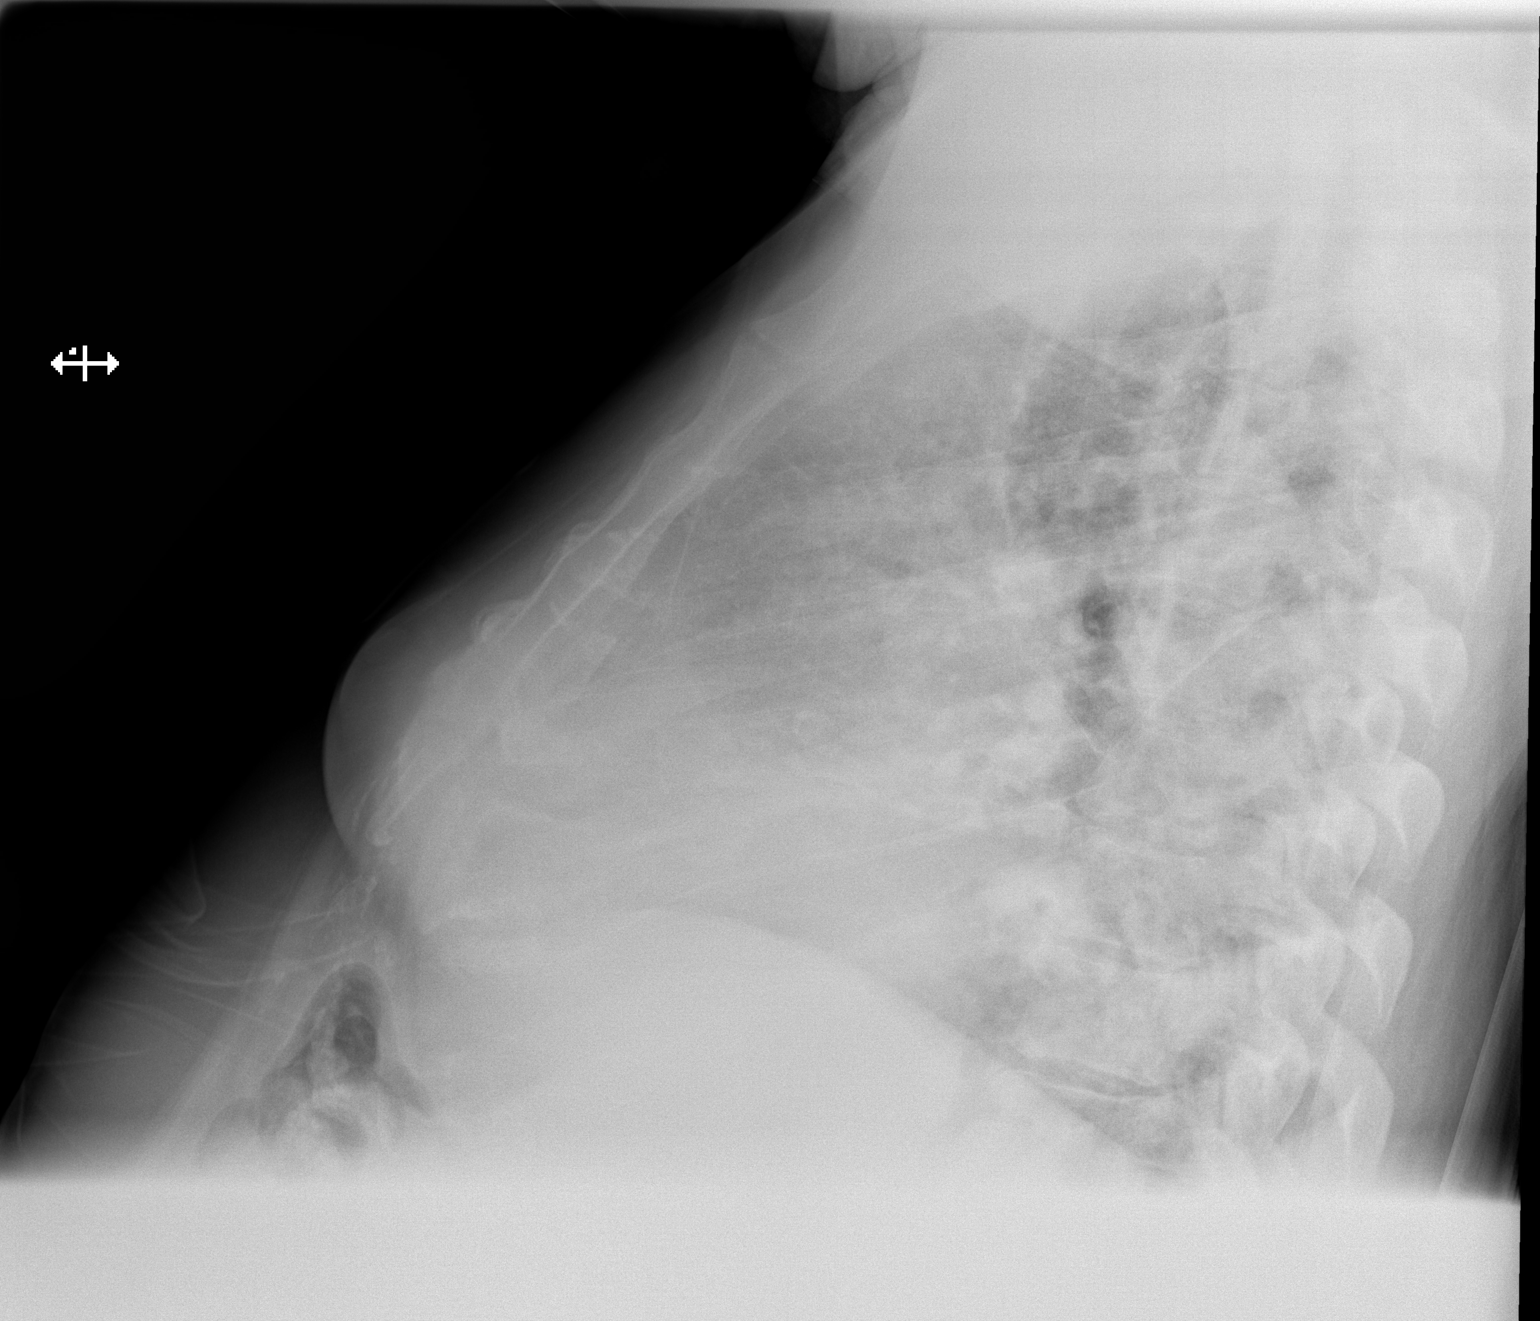

[2 of 2 positions shown; findings below may reference images not displayed]

FINDINGS: Marked cardiopericardial enlargement, which is chronic. There is
pulmonary venous congestion without overt edema. Streaky
retrocardiac opacification which was also seen previously, likely
atelectasis from the cardiomegaly.
IMPRESSION: 1. Cardiomegaly and pulmonary venous hypertension.
2. Retrocardiac opacity, likely atelectasis.

## 2014-07-17 ENCOUNTER — Telehealth: Payer: Self-pay | Admitting: Internal Medicine

## 2014-07-17 NOTE — Telephone Encounter (Signed)
Received records from [facility] for appointment with DR [facility] on 07/24/14.  Records given to [facility] (medical records) for Dr Debara Pickett schedule on 07/24/14. lp

## 2014-07-24 ENCOUNTER — Emergency Department (HOSPITAL_COMMUNITY)
Admission: EM | Admit: 2014-07-24 | Discharge: 2014-07-24 | Disposition: A | Discharge status: Home / Self Care | Payer: Medicare Other | Source: Home / Self Care | Attending: Emergency Medicine | Admitting: Emergency Medicine

## 2014-07-24 ENCOUNTER — Emergency Department (HOSPITAL_COMMUNITY): Payer: Medicare Other

## 2014-07-24 ENCOUNTER — Encounter (HOSPITAL_COMMUNITY): Payer: Self-pay | Admitting: Family Medicine

## 2014-07-24 ENCOUNTER — Ambulatory Visit: Payer: Medicare Other | Admitting: Internal Medicine

## 2014-07-24 DIAGNOSIS — R05 Cough: Secondary | ICD-10-CM

## 2014-07-24 DIAGNOSIS — Z87891 Personal history of nicotine dependence: Secondary | ICD-10-CM

## 2014-07-24 DIAGNOSIS — I129 Hypertensive chronic kidney disease with stage 1 through stage 4 chronic kidney disease, or unspecified chronic kidney disease: Secondary | ICD-10-CM | POA: Diagnosis present

## 2014-07-24 DIAGNOSIS — Z888 Allergy status to other drugs, medicaments and biological substances status: Secondary | ICD-10-CM

## 2014-07-24 DIAGNOSIS — I10 Essential (primary) hypertension: Secondary | ICD-10-CM | POA: Insufficient documentation

## 2014-07-24 DIAGNOSIS — L97911 Non-pressure chronic ulcer of unspecified part of right lower leg limited to breakdown of skin: Secondary | ICD-10-CM

## 2014-07-24 DIAGNOSIS — E875 Hyperkalemia: Secondary | ICD-10-CM | POA: Diagnosis present

## 2014-07-24 DIAGNOSIS — I429 Cardiomyopathy, unspecified: Secondary | ICD-10-CM | POA: Diagnosis not present

## 2014-07-24 DIAGNOSIS — K219 Gastro-esophageal reflux disease without esophagitis: Secondary | ICD-10-CM | POA: Diagnosis present

## 2014-07-24 DIAGNOSIS — I5043 Acute on chronic combined systolic (congestive) and diastolic (congestive) heart failure: Principal | ICD-10-CM | POA: Diagnosis present

## 2014-07-24 DIAGNOSIS — N183 Chronic kidney disease, stage 3 (moderate): Secondary | ICD-10-CM | POA: Diagnosis present

## 2014-07-24 DIAGNOSIS — I472 Ventricular tachycardia: Secondary | ICD-10-CM | POA: Diagnosis present

## 2014-07-24 DIAGNOSIS — I426 Alcoholic cardiomyopathy: Secondary | ICD-10-CM | POA: Diagnosis present

## 2014-07-24 DIAGNOSIS — E119 Type 2 diabetes mellitus without complications: Secondary | ICD-10-CM

## 2014-07-24 DIAGNOSIS — L97811 Non-pressure chronic ulcer of other part of right lower leg limited to breakdown of skin: Secondary | ICD-10-CM

## 2014-07-24 DIAGNOSIS — M109 Gout, unspecified: Secondary | ICD-10-CM | POA: Insufficient documentation

## 2014-07-24 DIAGNOSIS — Z7982 Long term (current) use of aspirin: Secondary | ICD-10-CM

## 2014-07-24 DIAGNOSIS — E1122 Type 2 diabetes mellitus with diabetic chronic kidney disease: Secondary | ICD-10-CM | POA: Diagnosis present

## 2014-07-24 DIAGNOSIS — Z79899 Other long term (current) drug therapy: Secondary | ICD-10-CM

## 2014-07-24 DIAGNOSIS — E778 Other disorders of glycoprotein metabolism: Secondary | ICD-10-CM | POA: Diagnosis present

## 2014-07-24 DIAGNOSIS — Z792 Long term (current) use of antibiotics: Secondary | ICD-10-CM

## 2014-07-24 DIAGNOSIS — J449 Chronic obstructive pulmonary disease, unspecified: Secondary | ICD-10-CM | POA: Diagnosis present

## 2014-07-24 DIAGNOSIS — D638 Anemia in other chronic diseases classified elsewhere: Secondary | ICD-10-CM | POA: Diagnosis present

## 2014-07-24 DIAGNOSIS — I4892 Unspecified atrial flutter: Secondary | ICD-10-CM

## 2014-07-24 DIAGNOSIS — I509 Heart failure, unspecified: Secondary | ICD-10-CM

## 2014-07-24 DIAGNOSIS — R600 Localized edema: Secondary | ICD-10-CM

## 2014-07-24 DIAGNOSIS — Z8619 Personal history of other infectious and parasitic diseases: Secondary | ICD-10-CM

## 2014-07-24 DIAGNOSIS — Z6835 Body mass index (BMI) 35.0-35.9, adult: Secondary | ICD-10-CM

## 2014-07-24 DIAGNOSIS — Z8719 Personal history of other diseases of the digestive system: Secondary | ICD-10-CM | POA: Insufficient documentation

## 2014-07-24 DIAGNOSIS — Z9119 Patient's noncompliance with other medical treatment and regimen: Secondary | ICD-10-CM | POA: Diagnosis present

## 2014-07-24 DIAGNOSIS — L97309 Non-pressure chronic ulcer of unspecified ankle with unspecified severity: Secondary | ICD-10-CM | POA: Diagnosis present

## 2014-07-24 DIAGNOSIS — E46 Unspecified protein-calorie malnutrition: Secondary | ICD-10-CM | POA: Diagnosis present

## 2014-07-24 DIAGNOSIS — I480 Paroxysmal atrial fibrillation: Secondary | ICD-10-CM | POA: Diagnosis present

## 2014-07-24 DIAGNOSIS — I2781 Cor pulmonale (chronic): Secondary | ICD-10-CM | POA: Diagnosis present

## 2014-07-24 DIAGNOSIS — R059 Cough, unspecified: Secondary | ICD-10-CM

## 2014-07-24 LAB — CBC
HCT: 38.6 % — ABNORMAL LOW (ref 39.0–52.0)
Hemoglobin: 11.6 g/dL — ABNORMAL LOW (ref 13.0–17.0)
MCH: 25.1 pg — ABNORMAL LOW (ref 26.0–34.0)
MCHC: 30.1 g/dL (ref 30.0–36.0)
MCV: 83.5 fL (ref 78.0–100.0)
Platelets: 112 10*3/uL — ABNORMAL LOW (ref 150–400)
RBC: 4.62 MIL/uL (ref 4.22–5.81)
RDW: 17.1 % — ABNORMAL HIGH (ref 11.5–15.5)
WBC: 4.1 10*3/uL (ref 4.0–10.5)

## 2014-07-24 LAB — BASIC METABOLIC PANEL
ANION GAP: 9 (ref 5–15)
BUN: 52 mg/dL — ABNORMAL HIGH (ref 6–20)
CALCIUM: 8.3 mg/dL — AB (ref 8.9–10.3)
CHLORIDE: 105 mmol/L (ref 101–111)
CO2: 28 mmol/L (ref 22–32)
Creatinine, Ser: 2.38 mg/dL — ABNORMAL HIGH (ref 0.61–1.24)
GFR, EST AFRICAN AMERICAN: 32 mL/min — AB (ref >60)
GFR, EST NON AFRICAN AMERICAN: 27 mL/min — AB (ref >60)
Glucose, Bld: 106 mg/dL — ABNORMAL HIGH (ref 65–99)
POTASSIUM: 4.3 mmol/L (ref 3.5–5.1)
SODIUM: 142 mmol/L (ref 135–145)

## 2014-07-24 LAB — I-STAT TROPONIN, ED: Troponin i, poc: 0.02 ng/mL (ref 0.00–0.08)

## 2014-07-24 LAB — BRAIN NATRIURETIC PEPTIDE: B NATRIURETIC PEPTIDE 5: 1903.1 pg/mL — AB (ref 0.0–100.0)

## 2014-07-24 LAB — DIGOXIN LEVEL: Digoxin Level: <0.2 ng/mL — ABNORMAL LOW (ref 0.8–2.0)

## 2014-07-24 MED ORDER — SULFAMETHOXAZOLE-TRIMETHOPRIM 800-160 MG PO TABS: 1.0000 | ORAL_TABLET | Freq: Two times a day (BID) | ORAL | Status: DC

## 2014-07-24 MED ORDER — CEPHALEXIN 500 MG PO CAPS: 500.0000 mg | ORAL_CAPSULE | Freq: Four times a day (QID) | ORAL | Status: DC

## 2014-07-24 MED ORDER — ALBUTEROL SULFATE (2.5 MG/3ML) 0.083% IN NEBU
5.0000 mg | INHALATION_SOLUTION | Freq: Once | RESPIRATORY_TRACT | Status: AC
  Administered 2014-07-24: 5 mg via RESPIRATORY_TRACT
  Filled 2014-07-24: qty 6

## 2014-07-24 MED ORDER — FUROSEMIDE 20 MG PO TABS
40.0000 mg | ORAL_TABLET | Freq: Once | ORAL | Status: AC
  Administered 2014-07-24: 40 mg via ORAL
  Filled 2014-07-24: qty 2

## 2014-07-24 MED ORDER — FUROSEMIDE 10 MG/ML IJ SOLN: 40.0000 mg | Freq: Once | INTRAMUSCULAR | Status: DC

## 2014-07-24 NOTE — ED Notes (Signed)
Pt here for cough, sore on right leg and edema all over. sts seen at pcp on Friday to increase dieretics without any change,. Pt wound to right leg is draining.

## 2014-07-24 NOTE — ED Provider Notes (Signed)
CSN: [id number]     Arrival date & time 07/24/14  1425 History   First MD Initiated Contact with Patient 07/24/14 1532     Chief Complaint  Patient presents with  . Leg Swelling     (Consider location/radiation/quality/duration/timing/severity/associated sxs/prior Treatment) The history is provided by the patient and medical records.    This is a 64 year old male with past medical history significant for hypertension, diabetes, congestive heart failure, COPD, GERD, gout, presenting to the ED for leg swelling. Patient states he has pitting edema of bilateral lower extremities, this is been progressively worsening despite taking his diuretics.  He states he also has a wound on his right lower leg which has been draining clear fluid. He does not recall any injury and states it is not painful. He denies any fever or chills. He does have a chronic cough which he states is somewhat worse than normal and has recently become productive with white sputum. He intermittently has shortness of breath and chest pain, but states this is not abnormal for him as this is a chronic issue. Patient states he does use nebulizer treatments at home which seemed to help his symptoms. He is no longer a smoker. He does not use any home oxygen.  Past Medical History  Diagnosis Date  . Diabetes mellitus without complication   . CHF (congestive heart failure)   . Hypertension   . Cardiomyopathy, dilated   . Small bowel obstruction 10/2012  . COPD (chronic obstructive pulmonary disease)   . Herpes     BUTT AND BACK- 9/23 HEALED  . Atrial flutter by electrocardiogram 11/2012  . GERD (gastroesophageal reflux disease)   . Gout    Past Surgical History  Procedure Laterality Date  . Laparoscopic incisional / umbilical / ventral hernia repair     Family History  Problem Relation Age of Onset  . Cancer Neg Hx    History  Substance Use Topics  . Smoking status: Former Smoker -- 0.25 packs/day for .5 years    Types:  Cigarettes    Quit date: 09/28/2012  . Smokeless tobacco: Never Used  . Alcohol Use: No    Review of Systems  Respiratory: Positive for cough.   Cardiovascular: Positive for leg swelling.  Skin: Positive for wound.  All other systems reviewed and are negative.     Allergies  Other  Home Medications   Prior to Admission medications   Medication Sig Start Date End Date Taking? Authorizing Provider  amiodarone (PACERONE) 200 MG tablet Take 1 tablet (200 mg total) by mouth daily. 06/06/13  Yes Jolaine Artist, MD  aspirin EC 81 MG tablet Take 81 mg by mouth daily.   Yes Historical Provider, MD  carvedilol (COREG) 3.125 MG tablet Take 1 tablet (3.125 mg total) by mouth 2 (two) times daily. 06/06/13  Yes Jolaine Artist, MD  digoxin (LANOXIN) 0.125 MG tablet TAKE ONE TABLET BY MOUTH EVERY OTHER DAY 02/08/14  Yes Jolaine Artist, MD  isosorbide dinitrate (ISORDIL) 10 MG tablet Take 10 mg by mouth 3 (three) times daily.   Yes Historical Provider, MD  metolazone (ZAROXOLYN) 5 MG tablet Take 5 mg by mouth daily.   Yes Historical Provider, MD  torsemide (DEMADEX) 20 MG tablet Take 2 tablets (40 mg total) by mouth 2 (two) times a week. Every Monday and Friday, Hold for wt less than 193 lb 03/16/14  Yes Delfina Redwood, MD  allopurinol (ZYLOPRIM) 100 MG tablet Take 1 tablet (100 mg total)  by mouth daily. Patient not taking: Reported on 07/24/2014 07/04/13   Larey Dresser, MD  colchicine 0.6 MG tablet Take 1 tablet (0.6 mg total) by mouth 2 (two) times daily as needed (gout flair). Patient not taking: Reported on 07/24/2014 03/16/14   Delfina Redwood, MD  hydrALAZINE (APRESOLINE) 10 MG tablet Take 1 tablet (10 mg total) by mouth 3 (three) times daily. Patient not taking: Reported on 07/24/2014 03/16/14   Delfina Redwood, MD  metroNIDAZOLE (FLAGYL) 500 MG tablet Take 1 tablet (500 mg total) by mouth every 8 (eight) hours. Until gone Patient not taking: Reported on 07/24/2014 03/16/14    Delfina Redwood, MD   BP 141/75 mmHg  Pulse 79  Temp(Src) 97.6 F (36.4 C) (Oral)  Resp 18  SpO2 99%   Physical Exam  Constitutional: He is oriented to person, place, and time. He appears well-developed and well-nourished. No distress.  HENT:  Head: Normocephalic and atraumatic.  Mouth/Throat: Oropharynx is clear and moist.  Eyes: Conjunctivae and EOM are normal. Pupils are equal, round, and reactive to light.  Neck: Normal range of motion. Neck supple.  Cardiovascular: Normal rate, regular rhythm and normal heart sounds.   Pulmonary/Chest: Effort normal. No respiratory distress. He has wheezes.  Scattered expiratory wheezes; no distress; speaking in full complete sentences without difficulty  Abdominal: Soft. Bowel sounds are normal. There is no tenderness.  Musculoskeletal: Normal range of motion. He exhibits no edema.  1+ pitting edema of BLE Weeping wound of right lower posterior leg; clear drainage noted; minimal surrounding erythema; wound is non-tender to palpation; no tissue crepitus; poor foot hygiene with overgrown toenails  Neurological: He is alert and oriented to person, place, and time.  Skin: Skin is warm and dry. He is not diaphoretic.  Psychiatric: He has a normal mood and affect.  Nursing note and vitals reviewed.     ED Course  Procedures (including critical care time) Labs Review Labs Reviewed  CBC - Abnormal; Notable for the following:    Hemoglobin 11.6 (*)    HCT 38.6 (*)    MCH 25.1 (*)    RDW 17.1 (*)    Platelets 112 (*)    All other components within normal limits  BASIC METABOLIC PANEL - Abnormal; Notable for the following:    Glucose, Bld 106 (*)    BUN 52 (*)    Creatinine, Ser 2.38 (*)    Calcium 8.3 (*)    GFR calc non Af Amer 27 (*)    GFR calc Af Amer 32 (*)    All other components within normal limits  BRAIN NATRIURETIC PEPTIDE - Abnormal; Notable for the following:    B Natriuretic Peptide 1903.1 (*)    All other components  within normal limits  I-STAT TROPOININ, ED    Imaging Review Dg Chest 2 View  07/24/2014   CLINICAL DATA:  Productive cough, shortness of Breath  EXAM: CHEST  2 VIEW  COMPARISON:  03/10/2014  FINDINGS: Cardiomegaly again noted. Persistent central mild vascular congestion. No convincing pulmonary edema. Streaky left base retrocardiac atelectasis or infiltrate best seen on lateral view.  IMPRESSION: Central mild vascular congestion. No convincing pulmonary edema. Cardiomegaly. Streaky left base retrocardiac atelectasis or infiltrate.   Electronically Signed   By: Lahoma Crocker M.D.   On: 07/24/2014 15:53      EKG Interpretation None      MDM   Final diagnoses:  Bilateral leg edema  Ulcer of right leg, limited to breakdown  of skin   64 year old male here with multiple complaints. He notes lower extremity edema patient progressively worsening. He also has a wound on his right lower leg which appears to be a pressure ulcer and is  draining clear fluid. He denies any fever or chills. He has a chronic cough, white sputum noted. Patient afebrile and nontoxic in appearance on exam. He is in no acute respiratory distress, speaking in full sentences without difficulty.  During initial evaluation we had difficulty obtaining pulse ox as patient has artifical acrylic nails in place which accounts for single O2 sat of 79%-- i was personally in room for this, was not proper waveform.  O2 monitor placed on ear with O2 sats of 100% and have remained stable since.  EKG NSR without ishchemic changes.  Lab work as above-- trop negative, BNP elevated at 1900, much lower than previous values of >4500.  Chest x-ray does show vascular congestion. There is mention of possible infiltrate vs atelectasis, however given lack of fever and normal white count, feel this is more likely atelectasis. Patient was given nebulizer treatment with improvement of his wheezes.  Case discussed with patient's cardiologist, Dr. Nadyne Coombes--  recommends additional dose of IV lasix in ED, monitor for 2 hours afterwards and will see in clinic in the next 24-48 hours.    Patient has been observed as recommended by cardiology, VS have remained stable without hypoxia.  Lung sounds remain clear.  Patient will be d/c with bactrim and keflex for right leg wound, instructed on home wound care.  Will FU with Dr. Nadyne Coombes-- instructed to call tomorrow to schedule appt.  I also personally discussed this with patient's niece who is in charge of his medical care.  Discussed plan with patient, he/she acknowledged understanding and agreed with plan of care.  Return precautions given for new or worsening symptoms.  Case discussed with attending physician, Dr. Mingo Amber, who evaluated patient and agrees with assessment and plan of care.  Larene Pickett, PA-C 07/24/14 2120  Evelina Bucy, MD 07/24/14 [phone]

## 2014-07-24 NOTE — ED Notes (Signed)
Pt in xray

## 2014-07-24 NOTE — Discharge Instructions (Signed)
Continue your regular home medications.  Continue nebulizer treatments as needed for wheezing. Follow-up with Dr. Nadyne Coombes-- call office in the morning to schedule appt for tomm afternoon or Wednesday morning Return to the ED for new or worsening symptoms.

## 2014-07-24 NOTE — ED Notes (Signed)
Respiratory at bedside.

## 2014-07-24 NOTE — ED Notes (Signed)
Pt. Left with all belongings 

## 2014-07-26 ENCOUNTER — Inpatient Hospital Stay (HOSPITAL_COMMUNITY)
Admission: AD | Admit: 2014-07-26 | Discharge: 2014-07-29 | DRG: 292 | Disposition: A | Discharge status: Home / Self Care | Payer: Medicare Other | Source: Ambulatory Visit | Attending: Cardiology | Admitting: Cardiology

## 2014-07-26 ENCOUNTER — Encounter (HOSPITAL_COMMUNITY): Payer: Self-pay | Admitting: General Practice

## 2014-07-26 DIAGNOSIS — I480 Paroxysmal atrial fibrillation: Secondary | ICD-10-CM | POA: Diagnosis present

## 2014-07-26 DIAGNOSIS — I5043 Acute on chronic combined systolic (congestive) and diastolic (congestive) heart failure: Secondary | ICD-10-CM | POA: Diagnosis present

## 2014-07-26 DIAGNOSIS — I129 Hypertensive chronic kidney disease with stage 1 through stage 4 chronic kidney disease, or unspecified chronic kidney disease: Secondary | ICD-10-CM | POA: Diagnosis present

## 2014-07-26 DIAGNOSIS — Z9119 Patient's noncompliance with other medical treatment and regimen: Secondary | ICD-10-CM | POA: Diagnosis present

## 2014-07-26 DIAGNOSIS — I426 Alcoholic cardiomyopathy: Secondary | ICD-10-CM | POA: Diagnosis present

## 2014-07-26 DIAGNOSIS — Z792 Long term (current) use of antibiotics: Secondary | ICD-10-CM | POA: Diagnosis not present

## 2014-07-26 DIAGNOSIS — I429 Cardiomyopathy, unspecified: Secondary | ICD-10-CM | POA: Diagnosis present

## 2014-07-26 DIAGNOSIS — I2781 Cor pulmonale (chronic): Secondary | ICD-10-CM | POA: Diagnosis present

## 2014-07-26 DIAGNOSIS — L97309 Non-pressure chronic ulcer of unspecified ankle with unspecified severity: Secondary | ICD-10-CM | POA: Diagnosis present

## 2014-07-26 DIAGNOSIS — E1122 Type 2 diabetes mellitus with diabetic chronic kidney disease: Secondary | ICD-10-CM | POA: Diagnosis present

## 2014-07-26 DIAGNOSIS — Z7982 Long term (current) use of aspirin: Secondary | ICD-10-CM | POA: Diagnosis not present

## 2014-07-26 DIAGNOSIS — Z888 Allergy status to other drugs, medicaments and biological substances status: Secondary | ICD-10-CM | POA: Diagnosis not present

## 2014-07-26 DIAGNOSIS — R601 Generalized edema: Secondary | ICD-10-CM

## 2014-07-26 DIAGNOSIS — M109 Gout, unspecified: Secondary | ICD-10-CM | POA: Diagnosis present

## 2014-07-26 DIAGNOSIS — Z87891 Personal history of nicotine dependence: Secondary | ICD-10-CM | POA: Diagnosis not present

## 2014-07-26 DIAGNOSIS — E875 Hyperkalemia: Secondary | ICD-10-CM | POA: Diagnosis present

## 2014-07-26 DIAGNOSIS — K219 Gastro-esophageal reflux disease without esophagitis: Secondary | ICD-10-CM | POA: Diagnosis present

## 2014-07-26 DIAGNOSIS — E778 Other disorders of glycoprotein metabolism: Secondary | ICD-10-CM | POA: Diagnosis present

## 2014-07-26 DIAGNOSIS — I472 Ventricular tachycardia: Secondary | ICD-10-CM | POA: Diagnosis present

## 2014-07-26 DIAGNOSIS — I5040 Unspecified combined systolic (congestive) and diastolic (congestive) heart failure: Secondary | ICD-10-CM | POA: Diagnosis present

## 2014-07-26 DIAGNOSIS — D638 Anemia in other chronic diseases classified elsewhere: Secondary | ICD-10-CM | POA: Diagnosis present

## 2014-07-26 DIAGNOSIS — E46 Unspecified protein-calorie malnutrition: Secondary | ICD-10-CM | POA: Diagnosis present

## 2014-07-26 DIAGNOSIS — N183 Chronic kidney disease, stage 3 (moderate): Secondary | ICD-10-CM | POA: Diagnosis present

## 2014-07-26 DIAGNOSIS — J449 Chronic obstructive pulmonary disease, unspecified: Secondary | ICD-10-CM | POA: Diagnosis present

## 2014-07-26 DIAGNOSIS — Z6835 Body mass index (BMI) 35.0-35.9, adult: Secondary | ICD-10-CM | POA: Diagnosis not present

## 2014-07-26 LAB — GLUCOSE, CAPILLARY
GLUCOSE-CAPILLARY: 66 mg/dL (ref 65–99)
GLUCOSE-CAPILLARY: 110 mg/dL — AB (ref 65–99)
Glucose-Capillary: 66 mg/dL (ref 65–99)
Glucose-Capillary: 98 mg/dL (ref 65–99)

## 2014-07-26 LAB — CBC
HCT: 36.5 % — ABNORMAL LOW (ref 39.0–52.0)
Hemoglobin: 11.1 g/dL — ABNORMAL LOW (ref 13.0–17.0)
MCH: 25.1 pg — ABNORMAL LOW (ref 26.0–34.0)
MCHC: 30.4 g/dL (ref 30.0–36.0)
MCV: 82.4 fL (ref 78.0–100.0)
Platelets: 167 10*3/uL (ref 150–400)
RBC: 4.43 MIL/uL (ref 4.22–5.81)
RDW: 16.7 % — AB (ref 11.5–15.5)
WBC: 4.4 10*3/uL (ref 4.0–10.5)

## 2014-07-26 LAB — CREATININE, SERUM
Creatinine, Ser: 2.3 mg/dL — ABNORMAL HIGH (ref 0.61–1.24)
GFR calc Af Amer: 33 mL/min — ABNORMAL LOW (ref >60)
GFR, EST NON AFRICAN AMERICAN: 29 mL/min — AB (ref >60)

## 2014-07-26 MED ORDER — ASPIRIN EC 81 MG PO TBEC
81.0000 mg | DELAYED_RELEASE_TABLET | Freq: Every day | ORAL | Status: DC
  Administered 2014-07-27 – 2014-07-28 (×2): 81 mg via ORAL
  Filled 2014-07-26 (×4): qty 1

## 2014-07-26 MED ORDER — FUROSEMIDE 80 MG PO TABS
80.0000 mg | ORAL_TABLET | Freq: Four times a day (QID) | ORAL | Status: DC
  Administered 2014-07-26 – 2014-07-27 (×2): 80 mg via ORAL
  Filled 2014-07-26 (×4): qty 1

## 2014-07-26 MED ORDER — SODIUM CHLORIDE 0.9 % IJ SOLN
3.0000 mL | Freq: Two times a day (BID) | INTRAMUSCULAR | Status: DC
  Administered 2014-07-26 – 2014-07-29 (×7): 3 mL via INTRAVENOUS

## 2014-07-26 MED ORDER — ACETAMINOPHEN 325 MG PO TABS: 650.0000 mg | ORAL_TABLET | ORAL | Status: DC | PRN

## 2014-07-26 MED ORDER — ONDANSETRON HCL 4 MG/2ML IJ SOLN: 4.0000 mg | Freq: Four times a day (QID) | INTRAMUSCULAR | Status: DC | PRN

## 2014-07-26 MED ORDER — ALPRAZOLAM 0.25 MG PO TABS: 0.2500 mg | ORAL_TABLET | Freq: Two times a day (BID) | ORAL | Status: DC | PRN

## 2014-07-26 MED ORDER — DIGOXIN 125 MCG PO TABS
125.0000 ug | ORAL_TABLET | ORAL | Status: DC
  Administered 2014-07-27 – 2014-07-29 (×2): 125 ug via ORAL
  Filled 2014-07-26 (×2): qty 1

## 2014-07-26 MED ORDER — METOLAZONE 5 MG PO TABS
5.0000 mg | ORAL_TABLET | Freq: Every day | ORAL | Status: DC
  Administered 2014-07-26 – 2014-07-29 (×4): 5 mg via ORAL
  Filled 2014-07-26 (×4): qty 1

## 2014-07-26 MED ORDER — CEPHALEXIN 500 MG PO CAPS
500.0000 mg | ORAL_CAPSULE | Freq: Three times a day (TID) | ORAL | Status: DC
  Administered 2014-07-27 – 2014-07-29 (×7): 500 mg via ORAL
  Filled 2014-07-26 (×9): qty 1

## 2014-07-26 MED ORDER — SODIUM CHLORIDE 0.9 % IV SOLN: 250.0000 mL | INTRAVENOUS | Status: DC | PRN

## 2014-07-26 MED ORDER — ALLOPURINOL 100 MG PO TABS
100.0000 mg | ORAL_TABLET | Freq: Every day | ORAL | Status: DC
  Administered 2014-07-27 – 2014-07-29 (×3): 100 mg via ORAL
  Filled 2014-07-26 (×3): qty 1

## 2014-07-26 MED ORDER — ISOSORB DINITRATE-HYDRALAZINE 20-37.5 MG PO TABS
2.0000 | ORAL_TABLET | Freq: Three times a day (TID) | ORAL | Status: DC
  Administered 2014-07-26 – 2014-07-29 (×8): 2 via ORAL
  Filled 2014-07-26 (×12): qty 2

## 2014-07-26 MED ORDER — SODIUM CHLORIDE 0.9 % IJ SOLN
3.0000 mL | INTRAMUSCULAR | Status: DC | PRN
  Administered 2014-07-28: 3 mL via INTRAVENOUS
  Filled 2014-07-26: qty 3

## 2014-07-26 MED ORDER — HEPARIN SODIUM (PORCINE) 5000 UNIT/ML IJ SOLN
5000.0000 [IU] | Freq: Three times a day (TID) | INTRAMUSCULAR | Status: DC
  Administered 2014-07-26 – 2014-07-29 (×8): 5000 [IU] via SUBCUTANEOUS
  Filled 2014-07-26 (×11): qty 1

## 2014-07-26 MED ORDER — AMIODARONE HCL 200 MG PO TABS
200.0000 mg | ORAL_TABLET | Freq: Every day | ORAL | Status: DC
  Administered 2014-07-27 – 2014-07-29 (×3): 200 mg via ORAL
  Filled 2014-07-26 (×3): qty 1

## 2014-07-26 MED ORDER — ASPIRIN EC 81 MG PO TBEC
81.0000 mg | DELAYED_RELEASE_TABLET | Freq: Every day | ORAL | Status: DC
  Administered 2014-07-27 – 2014-07-29 (×3): 81 mg via ORAL
  Filled 2014-07-26 (×3): qty 1

## 2014-07-26 MED ORDER — INSULIN ASPART 100 UNIT/ML ~~LOC~~ SOLN
0.0000 [IU] | Freq: Three times a day (TID) | SUBCUTANEOUS | Status: DC
  Administered 2014-07-29: 2 [IU] via SUBCUTANEOUS

## 2014-07-26 MED ORDER — SPIRONOLACTONE 25 MG PO TABS
25.0000 mg | ORAL_TABLET | Freq: Every day | ORAL | Status: DC
  Administered 2014-07-26 – 2014-07-29 (×4): 25 mg via ORAL
  Filled 2014-07-26 (×4): qty 1

## 2014-07-26 MED ORDER — CARVEDILOL 3.125 MG PO TABS
3.1250 mg | ORAL_TABLET | Freq: Two times a day (BID) | ORAL | Status: DC
  Administered 2014-07-27 – 2014-07-29 (×5): 3.125 mg via ORAL
  Filled 2014-07-26 (×7): qty 1

## 2014-07-26 NOTE — Consult Note (Signed)
WOC wound consult note Reason for Consult: Consult requested for right leg.  Pt states he bumped it at home and it has large amt drainage. Wound type: Full thickness stasis ulcer Measurement:6X8X.2cm Wound bed: 100% beefy red Drainage (amount, consistency, odor) Large amt yellow drainage, no odor Periwound: Generalized edema to bilat legs Dressing procedure/placement/frequency: Alginate to absorb drainage; Ace wrap for light compression.  Keep elevated to reduce edema.  Discussed plan of care with patient and he verbalizes understanding. Please re-consult if further assistance is needed.  Thank-you,  Julien Girt MSN, [place], [place], [place], [place]

## 2014-07-26 NOTE — Progress Notes (Signed)
New pt admission from Dr. Einar Gip office. Pt brought to the floor in stable condition via paramedic. Vitals taken. Initial Assessment done. All immediate pertinent needs to patient addressed. Patient Guide given to patient. Important safety instructions relating to hospitalization reviewed with patient. Patient verbalized understanding. Will continue to monitor pt.  Maurene Capes RN

## 2014-07-26 NOTE — H&P (Signed)
Caleb Bauer is an 65 y.o. male.   Chief Complaint: Leg edema HPI: Caleb Bauer  is a 64 y.o. male  who presents for a follow-up for Congestive heart failure. 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 Now has his own apartment and has [place] and is able to live independantly and cooks himself and does have occasional forgetfullness and missed taking his medications. Neice [name]) present at the bedside.  Echo on 02/13/2013 had revealed EF of 20% which had slightly decreased to 15-20% on 03/12/2014. He also has history of nonsustained ventricular tachycardia, paroxysmal atrial flutter, history of hypertension, diabetes mellitus (controlled with diet), chronic anemia, history of smoking, quit since June 2013. No h/o angina or MI.  Patient is accompanied by his niece, who brought him to office. He was last seen in the office in March 2015. He presented to the emergency department on 07/24/2014 with increasing lower extremity edema and ulceration to right lower extremity. Given dose of IV Lasix and presents today for follow-up. Has a wound and weeping ulcer on the back of the leg above ankle. No fever or chills. No foul smelling discharge. Has noticed leg edema over the past 2 weeks and worsening.  Past Medical History  Diagnosis Date  . Diabetes mellitus without complication   . CHF (congestive heart failure)   . Hypertension   . Cardiomyopathy, dilated   . Small bowel obstruction 10/2012  . COPD (chronic obstructive pulmonary disease)   . Herpes     BUTT AND BACK- 9/23 HEALED  . Atrial flutter by electrocardiogram 11/2012  . GERD (gastroesophageal reflux disease)   . Gout     Past Surgical History  Procedure Laterality Date  . Laparoscopic incisional / umbilical / ventral hernia repair      Family History   Problem Relation Age of Onset  . Cancer Neg Hx    Social History:  reports that he quit smoking about 21 months ago. His smoking use included Cigarettes. He has a .125 pack-year smoking history. He has never used smokeless tobacco. He reports that he does not drink alcohol or use illicit drugs.  Allergies:  Allergies  Allergen Reactions  . Other Nausea And Vomiting    Patient stated drug is "kerein"    Review of Systems - General:Present- Weight Gain > 10lbs.. Not Present- Fever. Respiratory:Not Present- Bloody sputum, Decreased Exercise Tolerance and Wakes up from Sleep Wheezing or Short of Breath. Cardiovascular:Present- Edema. Not Present- Chest Pain, Claudications, Orthopnea and Paroxysmal Nocturnal Dyspnea. Gastrointestinal:Not Present- Change in Bowel Habits, Constipation and Nausea. Endocrine:Not Present- Appetite Changes, Cold Intolerance and Heat Intolerance. Hematology:Present- Anemia. Not Present- Petechiae and Prolonged Bleeding. All other systems negative.  Exam:   Blood pressure 126/86, pulse 73, temperature 98.1 F (36.7 C), temperature source Oral, resp. rate 20, height 6' (1.829 m), weight 126.735 kg (279 lb 6.4 oz), SpO2 92 %.  General: Mental Status- Alert. General Appearance- Cooperative and Appears older than stated age. Not in acute distress. Orientation- Oriented X3. Build & Nutrition- Well built and Moderately obese.   Head and Neck: ThyroidGland Characteristics- no palpable nodules. no palpable enlargement.  JVD present.   Chest and Lung Exam: Palpation:Tender- No chest wall tenderness. Auscultation:Breath sounds:- Clear.   Cardiovascular Inspection:Jugular vein- Right- No Distention. Auscultation:Heart Sounds- S1 WNL, S2 WNL and No gallop  present. Murmurs & Other Heart Sounds: Murmur:Location- Apex. Timing- Early systolic. Grade- II/VI.   Abdomen Inspection:Contour- Obese. Palpation/Percussion:Palpation and Percussion of  the abdomen reveal - Non Tender and No hepatosplenomegaly. Liver:Surface- Smooth (felt just below the right subcostal region). Auscultation:Auscultation of the abdomen reveals - Bowel sounds normal.   Peripheral Vascular: Lower Extremity:Inspection- Left- No Pigmentation or Varicose veins. Right- No Pigmentation or Varicose veins. Palpation:Edema- Left- No edema. Right- No edema. Bilateral- 4+ Pitting edema(skin breakdown right leg above ankle posteriorly). Femoral pulse- Left- Normal. Right- Normal. Popliteal pulse- Bilateral- Feeble. Dorsalis pedis pulse- Bilateral- Absent. Posterior tibial pulse- Bilateral- 0+. arotid arteries- Left- No Carotid bruit. Right- No Carotid bruit. Abdomen- No prominent abdominal aortic pulsation or epigastric bruit.   Neurologic: Motor:- Grossly intact without any focal deficits.  Results for orders placed or performed during the hospital encounter of 07/24/14 (from the past 48 hour(s))  Digoxin level     Status: Abnormal   Collection Time: 07/24/14  5:00 PM  Result Value Ref Range   Digoxin Level <0.2 (L) 0.8 - 2.0 ng/mL    Comment: RESULTS CONFIRMED BY MANUAL DILUTION   No results found.  Labs:   Lab Results  Component Value Date   WBC 4.1 07/24/2014   HGB 11.6* 07/24/2014   HCT 38.6* 07/24/2014   MCV 83.5 07/24/2014   PLT 112* 07/24/2014    Recent Labs Lab 07/24/14 1441  NA 142  K 4.3  CL 105  CO2 28  BUN 52*  CREATININE 2.38*  CALCIUM 8.3*  GLUCOSE 106*    Lipid Panel     Component Value Date/Time   TRIG 133 03/10/2013 0315    BNP (last 3 results)  Recent Labs  03/10/14 1757 07/24/14 1441  BNP >4500.0* 1903.1*    ProBNP (last 3 results) No results for input(s): PROBNP in the last 8760 hours.  HEMOGLOBIN A1C Lab Results  Component Value Date   HGBA1C 6.2* 09/12/2012   MPG 131* 09/12/2012    Cardiac Panel (last 3 results)  Recent Labs  09/12/13 0030 09/12/13 0312 09/12/13 0500   CKTOTAL 833*  --  706*  TROPONINI  --  <0.30  --     Lab Results  Component Value Date   CKTOTAL 706* 09/12/2013   TROPONINI <0.30 09/12/2013    Echocardiogram 03/12/2014: LVEF 15-20%, markedly dilated LV, findings consistent with dilated cardiomyopathy. Right ventricle moderately dilated with moderate decrease in RV systolic function. Small was treated pericardial effusion. No significant valvular abnormality. IVC is dilated with absent respiratory variation. No significant change from Hospital Echocardiogram 03/10/2013 and OP echo on Echo- 02/03/13.  EKG 07/26/2014: Sinus rhythm with first-degree AV block at the rate of 71 bpm, left atrial enlargement, left axis deviation, left plantar fascicular block. Right bundle branch block. LVH with repolarization abnormality. Persistence of S wave in the lateral leads, suggests RVH. Biventricular hypertrophy.  Medications Prior to Admission  Medication Sig Dispense Refill  . allopurinol (ZYLOPRIM) 100 MG tablet Take 1 tablet (100 mg total) by mouth daily. (Patient not taking: Reported on 07/24/2014) 30 tablet 6  . amiodarone (PACERONE) 200 MG tablet Take 1 tablet (200 mg total) by mouth daily. 90 tablet 3  . aspirin EC 81 MG tablet Take 81 mg by mouth daily.    . carvedilol (COREG) 3.125 MG tablet Take 1 tablet (3.125 mg total) by mouth 2 (two) times daily. 180 tablet 3  . cephALEXin (KEFLEX) 500 MG capsule Take 1 capsule (500 mg total) by mouth 4 (four)  times daily. 40 capsule 0  . colchicine 0.6 MG tablet Take 1 tablet (0.6 mg total) by mouth 2 (two) times daily as needed (gout flair). (Patient not taking: Reported on 07/24/2014) 30 tablet 0  . digoxin (LANOXIN) 0.125 MG tablet TAKE ONE TABLET BY MOUTH EVERY OTHER DAY 15 tablet 2  . hydrALAZINE (APRESOLINE) 10 MG tablet Take 1 tablet (10 mg total) by mouth 3 (three) times daily. (Patient not taking: Reported on 07/24/2014) 90 tablet 1  . isosorbide dinitrate (ISORDIL) 10 MG tablet Take 10 mg by mouth 3  (three) times daily.    . metolazone (ZAROXOLYN) 5 MG tablet Take 5 mg by mouth daily.    . metroNIDAZOLE (FLAGYL) 500 MG tablet Take 1 tablet (500 mg total) by mouth every 8 (eight) hours. Until gone (Patient not taking: Reported on 07/24/2014) 27 tablet 0  . sulfamethoxazole-trimethoprim (BACTRIM DS,SEPTRA DS) 800-160 MG per tablet Take 1 tablet by mouth 2 (two) times daily. 20 tablet 0  . torsemide (DEMADEX) 20 MG tablet Take 2 tablets (40 mg total) by mouth 2 (two) times a week. Every Monday and Friday, Hold for wt less than 193 lb 180 tablet 0     Current facility-administered medications:  .  0.9 %  sodium chloride infusion, 250 mL, Intravenous, PRN, Adrian Prows, MD .  acetaminophen (TYLENOL) tablet 650 mg, 650 mg, Oral, Q4H PRN, Adrian Prows, MD .  ALPRAZolam Duanne Moron) tablet 0.25 mg, 0.25 mg, Oral, BID PRN, Adrian Prows, MD .  aspirin EC tablet 81 mg, 81 mg, Oral, Daily, Adrian Prows, MD .  furosemide (LASIX) tablet 80 mg, 80 mg, Oral, Q6H, Adrian Prows, MD .  heparin injection 5,000 Units, 5,000 Units, Subcutaneous, 3 times per day, Adrian Prows, MD .  insulin aspart (novoLOG) injection 0-15 Units, 0-15 Units, Subcutaneous, TID WC, Adrian Prows, MD .  isosorbide-hydrALAZINE (BIDIL) 20-37.5 MG per tablet 2 tablet, 2 tablet, Oral, TID, Adrian Prows, MD .  ondansetron [facility]) injection 4 mg, 4 mg, Intravenous, Q6H PRN, Adrian Prows, MD .  sodium chloride 0.9 % injection 3 mL, 3 mL, Intravenous, Q12H, Adrian Prows, MD .  sodium chloride 0.9 % injection 3 mL, 3 mL, Intravenous, PRN, Adrian Prows, MD  Facility-Administered Medications Ordered in Other Encounters:  .  propofol (DIPRIVAN) 10 mg/mL bolus/IV push, , , Anesthesia Intra-op, Carter Kitten, CRNA, 40 mg at 09/12/12 0000 .  succinylcholine (ANECTINE) injection, , , Anesthesia Intra-op, Carter Kitten, CRNA, 100 mg at 09/12/12 0000  Assessment/Plan 1. Anasarca and 4 plus bilateral leg edema with skin breakdown 2. Acute on chronic systolic and diastolic heart  failure 3. Chronic renal failure from DM stage IIIb 4. Chronic corpulmonale 5. Non-compliance 6. H/O NSVT, not a candidate for ICD implant Rec: Admission for diuresis. Watch out for rhythm issues, Wound care consult. Niece present at bedside.  Adrian Prows, MD 07/26/2014, 3:48 PM [facility] [place] Pager: [phone] Office: [phone] If no answer: Cell:  [phone]

## 2014-07-26 NOTE — Progress Notes (Signed)
Paged Dr. Einar Gip regarding needing pt orders since pt is a direct admission. Also requested a wound nurse consult for L leg. Will continue to monitor pt while awaiting orders.      Maurene Capes RN

## 2014-07-27 LAB — CBC WITH DIFFERENTIAL/PLATELET
BASOS ABS: 0 10*3/uL (ref 0.0–0.1)
BASOS PCT: 0 % (ref 0–1)
EOS ABS: 0.1 10*3/uL (ref 0.0–0.7)
Eosinophils Relative: 2 % (ref 0–5)
HCT: 34.6 % — ABNORMAL LOW (ref 39.0–52.0)
Hemoglobin: 10.3 g/dL — ABNORMAL LOW (ref 13.0–17.0)
Lymphocytes Relative: 17 % (ref 12–46)
Lymphs Abs: 0.7 10*3/uL (ref 0.7–4.0)
MCH: 24.6 pg — AB (ref 26.0–34.0)
MCHC: 29.8 g/dL — ABNORMAL LOW (ref 30.0–36.0)
MCV: 82.8 fL (ref 78.0–100.0)
Monocytes Absolute: 0.4 10*3/uL (ref 0.1–1.0)
Monocytes Relative: 8 % (ref 3–12)
Neutro Abs: 3.2 10*3/uL (ref 1.7–7.7)
Neutrophils Relative %: 73 % (ref 43–77)
Platelets: 140 10*3/uL — ABNORMAL LOW (ref 150–400)
RBC: 4.18 MIL/uL — ABNORMAL LOW (ref 4.22–5.81)
RDW: 16.8 % — ABNORMAL HIGH (ref 11.5–15.5)
WBC: 4.4 10*3/uL (ref 4.0–10.5)

## 2014-07-27 LAB — BASIC METABOLIC PANEL
ANION GAP: 11 (ref 5–15)
BUN: 53 mg/dL — ABNORMAL HIGH (ref 6–20)
CHLORIDE: 101 mmol/L (ref 101–111)
CO2: 29 mmol/L (ref 22–32)
Calcium: 7.9 mg/dL — ABNORMAL LOW (ref 8.9–10.3)
Creatinine, Ser: 2.13 mg/dL — ABNORMAL HIGH (ref 0.61–1.24)
GFR calc Af Amer: 36 mL/min — ABNORMAL LOW (ref >60)
GFR, EST NON AFRICAN AMERICAN: 31 mL/min — AB (ref >60)
Glucose, Bld: 102 mg/dL — ABNORMAL HIGH (ref 65–99)
Potassium: 4.1 mmol/L (ref 3.5–5.1)
Sodium: 141 mmol/L (ref 135–145)

## 2014-07-27 LAB — BRAIN NATRIURETIC PEPTIDE: B Natriuretic Peptide: 1943.2 pg/mL — ABNORMAL HIGH (ref 0.0–100.0)

## 2014-07-27 LAB — GLUCOSE, CAPILLARY
Glucose-Capillary: 91 mg/dL (ref 65–99)
Glucose-Capillary: 108 mg/dL — ABNORMAL HIGH (ref 65–99)
Glucose-Capillary: 78 mg/dL (ref 65–99)
Glucose-Capillary: 114 mg/dL — ABNORMAL HIGH (ref 65–99)

## 2014-07-27 LAB — HEMOGLOBIN A1C
Hgb A1c MFr Bld: 6.3 % — ABNORMAL HIGH (ref 4.8–5.6)
MEAN PLASMA GLUCOSE: 134 mg/dL

## 2014-07-27 LAB — TSH: TSH: 1.939 u[IU]/mL (ref 0.350–4.500)

## 2014-07-27 MED ORDER — FUROSEMIDE 10 MG/ML IJ SOLN
80.0000 mg | Freq: Four times a day (QID) | INTRAMUSCULAR | Status: AC
  Administered 2014-07-27 – 2014-07-28 (×6): 80 mg via INTRAVENOUS
  Filled 2014-07-27 (×8): qty 8

## 2014-07-27 NOTE — Progress Notes (Signed)
Subjective:  Patient states that he is feeling slightly better, he has now been seen by wound care and they have signed off.  His wound is related to anasarca significant leg edema and ascites.  Not much good response to high dose of Lasix by mouth, otherwise no new complaints.  States that patient has good appetite.  Objective:  Vital Signs in the last 24 hours: Temp:  [97.7 F (36.5 C)-97.8 F (36.6 C)] 97.8 F (36.6 C) (05/26 0454) Pulse Rate:  [65-77] 65 (05/26 1500) Resp:  [18-22] 18 (05/26 1005) BP: (99-113)/(52-72) 108/69 mmHg (05/26 1500) SpO2:  [92 %-100 %] 92 % (05/26 1500) Weight:  [125.2 kg (276 lb 0.3 oz)] 125.2 kg (276 lb 0.3 oz) (05/26 0454)  Intake/Output from previous day: 05/25 0701 - 05/26 0700 In: 1060 [P.O.:1060] Out: [place] [EZMOQ:[id number]; Stool:1]  Physical Exam:  General: Mental Status- Alert. General Appearance- Cooperative and Appears older than stated age. Not in acute distress. Orientation- Oriented X3. Build & Nutrition- Well built and Moderately obese.   Head and Neck: ThyroidGland Characteristics- no palpable nodules. no palpable enlargement. JVD present.   Chest and Lung Exam: Palpation:Tender- No chest wall tenderness. Auscultation:Breath sounds:- Clear.   Cardiovascular Inspection:Jugular vein- Right- No Distention. Auscultation:Heart Sounds- S1 WNL, S2 WNL and No gallop present. Murmurs & Other Heart Sounds: Murmur:Location- Apex. Timing- Early systolic. Grade- II/VI.   Abdomen Inspection:Contour- Obese. Palpation/Percussion:Palpation and Percussion of the abdomen reveal - Non Tender and No hepatosplenomegaly. Liver:Surface- Smooth (felt just below the right subcostal region). Auscultation:Auscultation of the abdomen reveals - Bowel sounds normal. ? Ascites.   Edema- Left- No edema. Right- No edema. Bilateral- 4+ Pitting edema(skin breakdown right leg above ankle posteriorly).   Peripheral Vascular: Lower  Extremity:Inspection- Left- No Pigmentation or Varicose veins. Right- No Pigmentation or Varicose veins. Palpation: Femoral pulse- Left- Normal. Right- Normal. Popliteal pulse- Bilateral- Feeble. Dorsalis pedis pulse- Bilateral- Absent. Posterior tibial pulse- Bilateral- 0+. arotid arteries- Left- No Carotid bruit. Right- No Carotid bruit. Abdomen- No prominent abdominal aortic pulsation or epigastric bruit.   Lab Results: BMP  Recent Labs  03/16/14 0745 07/24/14 1441 07/26/14 1705 07/27/14 0403  NA 144 142  --  141  K 4.2 4.3  --  4.1  CL 98 105  --  101  CO2 29 28  --  29  GLUCOSE 88 106*  --  102*  BUN 61* 52*  --  53*  CREATININE 2.06* 2.38* 2.30* 2.13*  CALCIUM 9.3 8.3*  --  7.9*  GFRNONAA 33* 27* 29* 31*  GFRAA 38* 32* 33* 36*    CBC  Recent Labs Lab 07/27/14 0403  WBC 4.4  RBC 4.18*  HGB 10.3*  HCT 34.6*  PLT 140*  MCV 82.8  MCH 24.6*  MCHC 29.8*  RDW 16.8*  LYMPHSABS 0.7  MONOABS 0.4  EOSABS 0.1  BASOSABS 0.0    HEMOGLOBIN A1C Lab Results  Component Value Date   HGBA1C 6.3* 07/26/2014   MPG 134 07/26/2014    Cardiac Panel (last 3 results)  Recent Labs  09/12/13 0030 09/12/13 0312 09/12/13 0500  CKTOTAL 833*  --  706*  TROPONINI  --  <0.30  --     BNP (last 3 results) No results for input(s): PROBNP in the last 8760 hours.  TSH  Recent Labs  07/27/14 0403  TSH 1.939    Hepatic Function Panel  Recent Labs  09/12/13 0312 09/12/13 0500 03/14/14 0428  PROT 7.8 8.0 6.8  ALBUMIN 2.5* 2.5* 2.1*  AST 26 25 12   ALT 10 11 8   ALKPHOS 82 88 57  BILITOT 0.6 0.6 0.5  BILIDIR 0.3  --   --   IBILI 0.3  --   --     Imaging: Imaging results have been reviewed  Cardiac Studies:  Echocardiogram 03/12/2014: LVEF 15-20%, markedly dilated LV, findings consistent with dilated cardiomyopathy. Right ventricle moderately dilated with moderate decrease in RV systolic function. Small was treated pericardial effusion. No  significant valvular abnormality. IVC is dilated with absent respiratory variation. No significant change from Hospital Echocardiogram 03/10/2013 and OP echo on Echo- 02/03/13.  EKG 07/26/2014: Sinus rhythm with first-degree AV block at the rate of 71 bpm, left atrial enlargement, left axis deviation, left plantar fascicular block. Right bundle branch block. LVH with repolarization abnormality. Persistence of S wave in the lateral leads, suggests RVH. Biventricular hypertrophy.  Assessment/Plan:  1. Anasarca and 4 plus bilateral leg edema with skin breakdown 2. Acute on chronic systolic and diastolic heart failure 3. Chronic renal failure from DM stage IIIb 4. Chronic corpulmonale 5. Non-compliance 6. H/O NSVT, not a candidate for ICD implant 7.  Severe anemia and hypoproteinemia/malnutrition.  Recommendation: Switched to IV furosemide, keep foot elevated as per wound recommendations, serum creatinine is remained stable, patient potentially be a candidate for discharge in the morning or in the next 48 hours.  Adrian Prows, M.D. 07/27/2014, 6:37 PM [facility] Pager: [phone] Office: [phone] If no answer: [phone]

## 2014-07-28 LAB — CBC WITH DIFFERENTIAL/PLATELET
Basophils Absolute: 0 10*3/uL (ref 0.0–0.1)
Basophils Relative: 0 % (ref 0–1)
Eosinophils Absolute: 0.2 10*3/uL (ref 0.0–0.7)
Eosinophils Relative: 4 % (ref 0–5)
HCT: 36.6 % — ABNORMAL LOW (ref 39.0–52.0)
Hemoglobin: 11.4 g/dL — ABNORMAL LOW (ref 13.0–17.0)
Lymphocytes Relative: 20 % (ref 12–46)
Lymphs Abs: 1 10*3/uL (ref 0.7–4.0)
MCH: 25.3 pg — ABNORMAL LOW (ref 26.0–34.0)
MCHC: 31.1 g/dL (ref 30.0–36.0)
MCV: 81.3 fL (ref 78.0–100.0)
Monocytes Absolute: 0.6 10*3/uL (ref 0.1–1.0)
Monocytes Relative: 12 % (ref 3–12)
Neutro Abs: 3.2 10*3/uL (ref 1.7–7.7)
Neutrophils Relative %: 64 % (ref 43–77)
Platelets: 169 10*3/uL (ref 150–400)
RBC: 4.5 MIL/uL (ref 4.22–5.81)
RDW: 16.6 % — ABNORMAL HIGH (ref 11.5–15.5)
WBC: 5 10*3/uL (ref 4.0–10.5)

## 2014-07-28 LAB — BASIC METABOLIC PANEL
ANION GAP: 12 (ref 5–15)
BUN: 50 mg/dL — AB (ref 6–20)
CHLORIDE: 96 mmol/L — AB (ref 101–111)
CO2: 32 mmol/L (ref 22–32)
Calcium: 8.3 mg/dL — ABNORMAL LOW (ref 8.9–10.3)
Creatinine, Ser: 2.36 mg/dL — ABNORMAL HIGH (ref 0.61–1.24)
GFR calc non Af Amer: 28 mL/min — ABNORMAL LOW (ref >60)
GFR, EST AFRICAN AMERICAN: 32 mL/min — AB (ref >60)
Glucose, Bld: 103 mg/dL — ABNORMAL HIGH (ref 65–99)
Potassium: 4.4 mmol/L (ref 3.5–5.1)
SODIUM: 140 mmol/L (ref 135–145)

## 2014-07-28 LAB — GLUCOSE, CAPILLARY
Glucose-Capillary: 111 mg/dL — ABNORMAL HIGH (ref 65–99)
Glucose-Capillary: 91 mg/dL (ref 65–99)
Glucose-Capillary: 88 mg/dL (ref 65–99)

## 2014-07-28 LAB — BRAIN NATRIURETIC PEPTIDE: B Natriuretic Peptide: 1316.4 pg/mL — ABNORMAL HIGH (ref 0.0–100.0)

## 2014-07-28 MED ORDER — TORSEMIDE 20 MG PO TABS
40.0000 mg | ORAL_TABLET | Freq: Every morning | ORAL | Status: DC
  Administered 2014-07-29: 40 mg via ORAL
  Filled 2014-07-28: qty 2

## 2014-07-28 MED ORDER — FUROSEMIDE 10 MG/ML IJ SOLN
80.0000 mg | Freq: Once | INTRAMUSCULAR | Status: AC
  Administered 2014-07-28: 80 mg via INTRAVENOUS

## 2014-07-28 NOTE — Progress Notes (Addendum)
Subjective:  Patient states that he is feeling slightly better, he has now been seen by wound care and they have signed off.  His wound is related to anasarca significant leg edema and ascites. Improved diuresis with IV lasix. Continues to have leg pain and difficulty ambulating.  States that patient has good appetite.  Objective:  Vital Signs in the last 24 hours: Temp:  [97.6 F (36.4 C)-98.1 F (36.7 C)] 98.1 F (36.7 C) (05/27 0411) Pulse Rate:  [65-74] 72 (05/27 0411) Resp:  [18-20] 20 (05/27 0411) BP: (103-123)/(52-69) 123/58 mmHg (05/27 0411) SpO2:  [91 %-100 %] 100 % (05/27 0411) Weight:  [124.9 kg (275 lb 5.7 oz)] 124.9 kg (275 lb 5.7 oz) (05/27 0411)  Intake/Output from previous day: 05/26 0701 - 05/27 0700 In: 1180 [P.O.:1180] Out: 5125 [Urine:5125]  Physical Exam:  General: Mental Status- Alert. General Appearance- Cooperative and Appears older than stated age. Not in acute distress. Orientation- Oriented X3. Build & Nutrition- Well built and Moderately obese. Head and Neck: ThyroidGland Characteristics- no palpable nodules. no palpable enlargement. JVD present. Chest and Lung Exam: Palpation:Tender- No chest wall tenderness. Auscultation:Breath sounds:- Clear. Cardiovascular Inspection:Jugular vein- Right- No Distention. Auscultation:Heart Sounds- S1 WNL, S2 WNL and No gallop present. Murmurs & Other Heart Sounds: Murmur:Location- Apex. Timing- Early systolic. Grade- II/VI. Abdomen Inspection:Contour- Obese. Palpation/Percussion:Palpation and Percussion of the abdomen reveal - Non Tender and No hepatosplenomegaly. Liver:Surface- Smooth (felt just below the right subcostal region). Auscultation:Auscultation of the abdomen reveals - Bowel sounds normal. No ascites. Edema-  Bilateral- 1+ Pitting edema(skin breakdown right leg above ankle posteriorly, bandaged and wrapped).  Peripheral Vascular: Lower Extremity:Inspection- Left- No  Pigmentation or Varicose veins. Right- No Pigmentation or Varicose veins. Palpation: Femoral pulse- Left- Normal. Right- Normal. Popliteal pulse- Bilateral- Feeble. Dorsalis pedis pulse- Bilateral- Absent. Posterior tibial pulse- Bilateral- 0+. arotid arteries- Left- No Carotid bruit. Right- No Carotid bruit. Abdomen- No prominent abdominal aortic pulsation or epigastric bruit.   Lab Results: BMP  Recent Labs  07/24/14 1441 07/26/14 1705 07/27/14 0403 07/28/14 0614  NA 142  --  141 140  K 4.3  --  4.1 4.4  CL 105  --  101 96*  CO2 28  --  29 32  GLUCOSE 106*  --  102* 103*  BUN 52*  --  53* 50*  CREATININE 2.38* 2.30* 2.13* 2.36*  CALCIUM 8.3*  --  7.9* 8.3*  GFRNONAA 27* 29* 31* 28*  GFRAA 32* 33* 36* 32*    CBC  Recent Labs Lab 07/28/14 0614  WBC 5.0  RBC 4.50  HGB 11.4*  HCT 36.6*  PLT 169  MCV 81.3  MCH 25.3*  MCHC 31.1  RDW 16.6*  LYMPHSABS 1.0  MONOABS 0.6  EOSABS 0.2  BASOSABS 0.0    HEMOGLOBIN A1C Lab Results  Component Value Date   HGBA1C 6.3* 07/26/2014   MPG 134 07/26/2014    Cardiac Panel (last 3 results)  Recent Labs  09/12/13 0030 09/12/13 0312 09/12/13 0500  CKTOTAL 833*  --  706*  TROPONINI  --  <0.30  --     BNP (last 3 results) No results for input(s): PROBNP in the last 8760 hours.  TSH  Recent Labs  07/27/14 0403  TSH 1.939    Hepatic Function Panel  Recent Labs  09/12/13 0312 09/12/13 0500 03/14/14 0428  PROT 7.8 8.0 6.8  ALBUMIN 2.5* 2.5* 2.1*  AST 26 25 12   ALT 10 11 8   ALKPHOS 82 88 57  BILITOT  0.6 0.6 0.5  BILIDIR 0.3  --   --   IBILI 0.3  --   --     Imaging: Imaging results have been reviewed  Cardiac Studies:  Echocardiogram 03/12/2014: LVEF 15-20%, markedly dilated LV, findings consistent with dilated cardiomyopathy. Right ventricle moderately dilated with moderate decrease in RV systolic function. Small was treated pericardial effusion. No significant valvular abnormality. IVC is  dilated with absent respiratory variation. No significant change from Hospital Echocardiogram 03/10/2013 and OP echo on Echo- 02/03/13.  EKG 07/26/2014: Sinus rhythm with first-degree AV block at the rate of 71 bpm, left atrial enlargement, left axis deviation, left plantar fascicular block. Right bundle branch block. LVH with repolarization abnormality. Persistence of S wave in the lateral leads, suggests RVH. Biventricular hypertrophy.  Assessment/Plan:  1. Anasarca and 1+ bilateral leg edema with skin breakdown: edema significantly improved with IV diuresis 2. Acute on chronic systolic and diastolic heart failure 3. Chronic renal failure from DM stage IIIb: creatinine stable 4. Chronic cor pulmonale 5. Non-compliance with prescribed medical therapy 6. H/O NSVT, not a candidate for ICD implant 7.  Severe anemia and hypoproteinemia/malnutrition.  Recommendation: Continue IV furosemide, keep foot elevated as per wound recommendations, serum creatinine is remained stable, plan to discharge home tomorrow morning.     Rachel Bo, NP-C 07/28/2014, 9:09 AM [facility] Pager: [phone] Office: [phone]

## 2014-07-28 NOTE — Clinical Social Work Note (Signed)
CSW received consult that patient was from a facility.  CSW met with patient and discussed where he is from, patient stated he lives in a retirement community called [facility].  Patient stated he lives in an apartment that has some assisted living assistance, but it is independent living.  Patient states his niece will pick him up once he is ready for discharge, and he has been receiving home health services.  Patient states he does not have any other concerns or issues, he is looking forward to going home once he is medically ready and discharge orders have been received.         Patient's plan is to discharge back home, CSW to sign off please re-consult if social work needs arise.  Jones Broom. [place], MSW, [place]

## 2014-07-28 NOTE — Progress Notes (Signed)
UR COMPLETED  

## 2014-07-28 NOTE — Clinical Documentation Improvement (Signed)
Supporting Information: Patient with a history of chronic renal failure from DM, stage IIIb per 5/26 progress notes.  Labs:   Bun   Creat    GFR: 5/26:     53      2.13       36   Possible Clinical Condition: . Document underlying condition(s) contributing/causing acute renal failure if known or suspected . Document if acute kidney injury (AKI) is due to traumatic injury or if due to a non-traumatic event . Document if acute renal failure is due to: --Acute tubular necrosis (ATN) --Acute cortical necrosis --Acute medullary necrosis --Other (specify) . Be specific with documentation --Acute renal insufficiency and acute kidney disease are not reported as acute renal failure . Document any associated diagnoses/conditions     Thank Sherian Maroon Documentation Specialist [phone] [email or web address]

## 2014-07-28 NOTE — Progress Notes (Signed)
Pharmacist Heart Failure Core Measure Documentation  Assessment: Caleb REIERSON has an EF documented as 15-20% on Jan 2016.  Rationale: Heart failure patients with left ventricular systolic dysfunction (LVSD) and an EF < 40% should be prescribed an angiotensin converting enzyme inhibitor (ACEI) or angiotensin receptor blocker (ARB) at discharge unless a contraindication is documented in the medical record.  This patient is not currently on an ACEI or ARB for HF.  This note is being placed in the record in order to provide documentation that a contraindication to the use of these agents is present for this encounter.  ACE Inhibitor or Angiotensin Receptor Blocker is contraindicated (specify all that apply)  []   ACEI allergy AND ARB allergy []   Angioedema []   Moderate or severe aortic stenosis []   Hyperkalemia []   Hypotension []   Renal artery stenosis [x]   Worsening renal function, preexisting renal disease or dysfunction   Harvel Quale 07/28/2014 3:05 PM

## 2014-07-29 LAB — BASIC METABOLIC PANEL
Anion gap: 12 (ref 5–15)
BUN: 53 mg/dL — ABNORMAL HIGH (ref 6–20)
CO2: 34 mmol/L — ABNORMAL HIGH (ref 22–32)
Calcium: 8.2 mg/dL — ABNORMAL LOW (ref 8.9–10.3)
Chloride: 91 mmol/L — ABNORMAL LOW (ref 101–111)
Creatinine, Ser: 2.49 mg/dL — ABNORMAL HIGH (ref 0.61–1.24)
GFR calc non Af Amer: 26 mL/min — ABNORMAL LOW (ref >60)
GFR, EST AFRICAN AMERICAN: 30 mL/min — AB (ref >60)
Glucose, Bld: 91 mg/dL (ref 65–99)
POTASSIUM: 4.1 mmol/L (ref 3.5–5.1)
Sodium: 137 mmol/L (ref 135–145)

## 2014-07-29 LAB — CBC WITH DIFFERENTIAL/PLATELET
BASOS ABS: 0 10*3/uL (ref 0.0–0.1)
BASOS PCT: 1 % (ref 0–1)
EOS ABS: 0.2 10*3/uL (ref 0.0–0.7)
Eosinophils Relative: 4 % (ref 0–5)
HCT: 36.7 % — ABNORMAL LOW (ref 39.0–52.0)
HEMOGLOBIN: 11 g/dL — AB (ref 13.0–17.0)
Lymphocytes Relative: 26 % (ref 12–46)
Lymphs Abs: 1.2 10*3/uL (ref 0.7–4.0)
MCH: 24.1 pg — ABNORMAL LOW (ref 26.0–34.0)
MCHC: 30 g/dL (ref 30.0–36.0)
MCV: 80.3 fL (ref 78.0–100.0)
Monocytes Absolute: 0.7 10*3/uL (ref 0.1–1.0)
Monocytes Relative: 15 % — ABNORMAL HIGH (ref 3–12)
NEUTROS PCT: 54 % (ref 43–77)
Neutro Abs: 2.6 10*3/uL (ref 1.7–7.7)
PLATELETS: 194 10*3/uL (ref 150–400)
RBC: 4.57 MIL/uL (ref 4.22–5.81)
RDW: 16.5 % — AB (ref 11.5–15.5)
WBC: 4.8 10*3/uL (ref 4.0–10.5)

## 2014-07-29 LAB — GLUCOSE, CAPILLARY
Glucose-Capillary: 88 mg/dL (ref 65–99)
Glucose-Capillary: 138 mg/dL — ABNORMAL HIGH (ref 65–99)

## 2014-07-29 LAB — BRAIN NATRIURETIC PEPTIDE: B Natriuretic Peptide: 1031.1 pg/mL — ABNORMAL HIGH (ref 0.0–100.0)

## 2014-07-29 MED ORDER — SPIRONOLACTONE 25 MG PO TABS: 25.0000 mg | ORAL_TABLET | Freq: Every day | ORAL | Status: DC

## 2014-07-29 NOTE — Discharge Summary (Signed)
Physician Discharge Summary  Patient ID: Caleb Bauer MRN: [id number] DOB/AGE: Feb 27, 1951 64 y.o.  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 Echo on 02/13/2013 had revealed EF of 20% which had slightly decreased to 15-20% on 03/12/2014. He also has history of nonsustained ventricular tachycardia, paroxysmal atrial flutter, history of hypertension, diabetes mellitus (controlled with diet), chronic anemia, history of smoking, quit since June 2013. He presented to the emergency department on 07/24/2014 with increasing lower extremity edema and ulceration to right lower extremity. He was given dose of IV Lasix and presented to the office for outpatient follow up on 07/26/2014. Lower extremity edema had increased and he was noted to have a wound and weeping ulcer on the back of the leg above the ankle. No fever or chills. No foul smelling discharge. He was admitted for IV diuresis and wound consultation.    Edema has almost completely resolved with IV diuresis.  Wound care was consulted and just recommended wrapping wound and suspected would improve as edema resolved.Creatinine slightly higher this morning but essentially stable. BNP continues to decrease.  Recommendations on discharge: Compliance with diet and medical therapy reinforced. Follow up outpatient.  Discharge Exam: Blood pressure 96/42, pulse 76, temperature 97.9 F (36.6 C), temperature source Oral, resp. rate 17, height 6' (1.829 m), weight 118.57 kg (261 lb 6.4 oz), SpO2 100 %.    General: Mental Status- Alert.  General Appearance- Cooperative and Appears older than stated age. Not in acute distress. Orientation- Oriented X3. Build & Nutrition- Well built and Moderately obese. Head and Neck: ThyroidGland Characteristics- no palpable nodules. no palpable enlargement. JVD present. Chest and Lung Exam: Palpation:Tender- No chest wall tenderness. Auscultation:Breath sounds:- Clear. Cardiovascular Inspection:Jugular vein- Right- No Distention. Auscultation:Heart Sounds- S1 WNL, S2 WNL and No gallop present. Murmurs & Other Heart Sounds: Murmur:Location- Apex. Timing- Early systolic. Grade- II/VI. Abdomen Inspection:Contour- Obese. Palpation/Percussion:Palpation and Percussion of the abdomen reveal - Non Tender and No hepatosplenomegaly. Liver:Surface- Smooth (felt just below the right subcostal region). Auscultation:Auscultation of the abdomen reveals - Bowel sounds normal. No ascites. Edema- Bilateral- trace edema(skin breakdown right leg above ankle posteriorly, bandaged and wrapped).  Peripheral Vascular: Lower Extremity:Inspection- Left- No Pigmentation or Varicose veins. Right- No Pigmentation or Varicose veins. Palpation: Femoral pulse- Left- Normal. Right- Normal. Popliteal pulse- Bilateral- Feeble. Dorsalis pedis pulse- Bilateral- Absent. Posterior tibial pulse- Bilateral- 0+. arotid arteries- Left- No Carotid bruit. Right- No Carotid bruit. Abdomen- No prominent abdominal aortic pulsation or epigastric bruit.  Labs:   Lab Results  Component Value Date   WBC 4.8 07/29/2014   HGB 11.0* 07/29/2014   HCT 36.7* 07/29/2014   MCV 80.3 07/29/2014   PLT 194 07/29/2014    Recent Labs Lab 07/29/14 0317  NA 137  K 4.1  CL 91*  CO2 34*  BUN 53*  CREATININE 2.49*  CALCIUM 8.2*  GLUCOSE 91    Lipid Panel     Component Value Date/Time   TRIG 133 03/10/2013 0315    BNP (last 3 results)  Recent Labs  07/27/14 0403 07/28/14 0615 07/29/14 0317   BNP 1943.2* 1316.4* 1031.1*    ProBNP (last 3 results) No  results for input(s): PROBNP in the last 8760 hours.  HEMOGLOBIN A1C Lab Results  Component Value Date   HGBA1C 6.3* 07/26/2014   MPG 134 07/26/2014    Cardiac Panel (last 3 results)  Recent Labs  09/12/13 0030 09/12/13 0312 09/12/13 0500  CKTOTAL 833*  --  706*  TROPONINI  --  <0.30  --     Lab Results  Component Value Date   CKTOTAL 706* 09/12/2013   TROPONINI <0.30 09/12/2013     TSH  Recent Labs  07/27/14 0403  TSH 1.939   EKG 07/26/2014: Sinus rhythm with first-degree AV block at the rate of 71 bpm, left atrial enlargement, left axis deviation, left plantar fascicular block. Right bundle branch block. LVH with repolarization abnormality. Persistence of S wave in the lateral leads, suggests RVH. Biventricular hypertrophy.   Echocardiogram 03/12/2014: LVEF 15-20%, markedly dilated LV, findings consistent with dilated cardiomyopathy. Right ventricle moderately dilated with moderate decrease in RV systolic function. Small was treated pericardial effusion. No significant valvular abnormality. IVC is dilated with absent respiratory variation. No significant change from Hospital Echocardiogram 03/10/2013 and OP echo on Echo- 02/03/13.   Radiology: Dg Chest 2 View  07/24/2014   CLINICAL DATA:  Productive cough, shortness of Breath  EXAM: CHEST  2 VIEW  COMPARISON:  03/10/2014  FINDINGS: Cardiomegaly again noted. Persistent central mild vascular congestion. No convincing pulmonary edema. Streaky left base retrocardiac atelectasis or infiltrate best seen on lateral view.  IMPRESSION: Central mild vascular congestion. No convincing pulmonary edema. Cardiomegaly. Streaky left base retrocardiac atelectasis or infiltrate.   Electronically Signed   By: Lahoma Crocker M.D.   On: 07/24/2014 15:53      FOLLOW UP PLANS AND APPOINTMENTS    Medication List    TAKE these medications        albuterol (2.5 MG/3ML) 0.083%  nebulizer solution  Commonly known as:  PROVENTIL  Take 2.5 mg by nebulization every 6 (six) hours as needed for wheezing or shortness of breath.     allopurinol 100 MG tablet  Commonly known as:  ZYLOPRIM  Take 1 tablet (100 mg total) by mouth daily.     amiodarone 200 MG tablet  Commonly known as:  PACERONE  Take 1 tablet (200 mg total) by mouth daily.     aspirin EC 81 MG tablet  Take 81 mg by mouth daily.     carvedilol 3.125 MG tablet  Commonly known as:  COREG  Take 1 tablet (3.125 mg total) by mouth 2 (two) times daily.     cephALEXin 500 MG capsule  Commonly known as:  KEFLEX  Take 1 capsule (500 mg total) by mouth 4 (four) times daily.     colchicine 0.6 MG tablet  Take 1 tablet (0.6 mg total) by mouth 2 (two) times daily as needed (gout flair).     digoxin 0.125 MG tablet  Commonly known as:  LANOXIN  TAKE ONE TABLET BY MOUTH EVERY OTHER DAY     DIUREX PO  Take 1 tablet by mouth daily.     isosorbide dinitrate 10 MG tablet  Commonly known as:  ISORDIL  Take 10 mg by mouth 3 (three) times daily.     metolazone 5 MG tablet  Commonly known as:  ZAROXOLYN  Take 5 mg by mouth daily.     spironolactone 25 MG tablet  Commonly known as:  ALDACTONE  Take 1 tablet (25 mg total) by mouth daily.     torsemide 20 MG tablet  Commonly known as:  DEMADEX  Take 2 tablets (40 mg total) by mouth 2 (two) times a week. Every Monday and Friday, Hold for wt less than 193 lb          Rachel Bo, NP-C 07/29/2014, 7:43 AM [facility] Pager: [phone] Office: [phone] If no answer: [phone]  I have personally reviewed the patient's record and performed physical exam and agree with the assessment and plan of Ms. Neldon Labella, NP-C.  Adrian Prows, MD 07/29/2014, 11:00 AM [facility] [place] Pager: [phone] Office: [phone] If no answer: Cell:  [phone]

## 2014-09-12 ENCOUNTER — Other Ambulatory Visit (HOSPITAL_COMMUNITY): Payer: Self-pay | Admitting: Cardiology

## 2014-09-12 NOTE — Telephone Encounter (Signed)
Please address this refill of Allopurinol.

## 2014-10-30 ENCOUNTER — Encounter: Payer: Self-pay | Admitting: Gastroenterology

## 2014-10-31 NOTE — Patient Outreach (Signed)
[place] [facility]) Care Management  10/31/2014  Caleb Bauer [birth year] [id number]   Referral from [place] [facility], assigned [name], RN to outreach.  Thanks, Ronnell Freshwater. [place], [place] Assistant Phone: [phone] Fax: [phone]

## 2014-11-02 ENCOUNTER — Other Ambulatory Visit: Payer: Self-pay

## 2014-11-02 NOTE — Patient Outreach (Signed)
[place] [facility]) Care Management  11/02/2014  JHOEL STIEG 1951-01-29 [id number]   Notification received from [name], [facility] to close case due to unsuccessful attempts to contact patient.  Saskia Simerson L. Diontae Route, [place] Care Management Assistant

## 2014-11-02 NOTE — Patient Outreach (Addendum)
Unsuccessful attempt made to contact patient via telephone for community care coordination. Numbers used in attempt to contact patient [phone] and [phone] both had outgoing messages that indicated number or code was invalid when dialing this number.   Plan: Discharge patient from caseload due to not being able to contact patient.

## 2014-11-17 ENCOUNTER — Telehealth: Payer: Self-pay

## 2014-11-17 NOTE — Patient Outreach (Signed)
Telephone call received from patient who identified himself by providing date of birth and address.    Patient advised this RNCM he had received a discharge letter from me.  Patient advised about [facility] services, services were totally volunteer and the reason for his discharge was that we were unable to make contact with patient.   Patient advised this RNCM he has a new number, and he would like to discuss further the benefits of [facility].  Patient provided this RNCM with his number telephone number of [phone] and agreed to make contact on Tuesday, September 20 in the afternoon.     Plan: Reactivate patient on my caseload for community care coordination. Make telephone contact on September 20 in the afternoon.

## 2014-11-21 ENCOUNTER — Other Ambulatory Visit: Payer: Self-pay

## 2014-11-21 NOTE — Patient Outreach (Signed)
This RNCM received a telephone call from patient stating he had received a letter stating [facility] had discharged him because of inability to contact.  Patient identified himself using HIPPA identifiers by providing date of birth and address. Patient stated he is interested in services after [facility] benefits were explained to patient. Patient provided this RNCM with a new telephone number.  Patient and this RNCM agreed to home visit next week for an assessment of community care coordination needs.   Plan: Home visit next week. Send request to [employer]. Laurance Flatten, [place] Assistant to place patient on active list

## 2014-11-23 NOTE — Patient Outreach (Signed)
[facility]) Care Management  11/23/2014  Caleb Bauer 1950/07/03 [id number]   Notification from [name], RN to open case for [name] as patient has contacted her and would like to participate with [place] Management.  Thanks, Ronnell Freshwater. [place], [place] Assistant Phone: [phone] Fax: [phone]

## 2014-11-29 ENCOUNTER — Other Ambulatory Visit: Payer: Self-pay

## 2014-11-29 DIAGNOSIS — I10 Essential (primary) hypertension: Secondary | ICD-10-CM

## 2014-11-29 NOTE — Patient Outreach (Signed)
[facility]) Care Management  11/29/2014  PIETRO BONURA [birth year] [id number]    Patient readily agreed to community care coordination. Patient lives alone in a retirement community, is able to perform ADLs and IADLs.   Patient's apartment is neat, clear of clutter.  Patient has good support system with one sister living in same retirement complex, however, in a different apartment.  Patient and this RNCM reviewed [facility] Information packet, discussed referral process and [facility] Program.  Patent states he knows all about heart failure, however, he does have a problem with transportation to medical appointments and have missed several appointments because of it.  [facility] LCSW referral made to assist with securing medical transportation.    Patient advised this RNCM of the following upcoming appointments.  October 21-appointment with Precious Haws, MD Primary Care Physician October 27 with [facility]  Patient weighs daily, records weight, however, he reports living a sedentary lifestyle. Patient states he walks rounds [facility] on [address] once a month.  Patient reports being compliant with his medications, has all medications on list.  [facility] RN comes every 2 weeks to fills medication box.    Patient and this RNCM agreed to meet in 3 weeks to further assess community care coordination needs.  Plan: Home visit Tuesday, October 14

## 2014-11-29 NOTE — Patient Outreach (Signed)
[place]) Care Management  11/29/2014  Caleb Bauer [birth year] [id number]   Request from [name], RN to assign SW, assigned Eula Fried, LCSW.  Thanks, Ronnell Freshwater. [place], [place] Assistant Phone: [phone] Fax: [phone]

## 2014-12-05 ENCOUNTER — Other Ambulatory Visit: Payer: Self-pay | Admitting: Licensed Clinical Social Worker

## 2014-12-05 NOTE — Patient Outreach (Signed)
  [place] [facility]) Care Management  12/05/2014  Caleb Bauer 11/22/1950 [id number]   Assessment-CSW received referral from [place] to help patient with transportation needs. CSW contacted patient, HIPPA verifications were received. CSW introduced self, reason for call and of [facility] social work services. Patient reports not having stable transportation. CSW completed psychosocial assessment. Patient denies any substance use or depressive symptoms. CSW reviewed both SCAT and Environmental manager and patient was in agreement for [employer] referral but not SCAT. Patient reported that he was "not interested in fooling around with SCAT." CSW will make [employer] referral and assist patient with application process. Patient reported having 2 upcoming appointments with no stable transportation at the end of the month. Patient could not communicate where appointments were or the times. Patient was in agreement to find out needed information in case [employer] has not been successfully set up and he will need other source of stable transportation. Patient agreeable to transportation goal for care plan.  Plan-CSW will update RNCM Caleb Bauer on upcoming appointments, await clarification from patient of those appointments and make [employer] referral.  .Caleb Bauer, [place], MSW, [email or web address] Phone: [phone] Fax: [phone]

## 2014-12-12 ENCOUNTER — Other Ambulatory Visit: Payer: Self-pay | Admitting: Licensed Clinical Social Worker

## 2014-12-12 NOTE — Patient Outreach (Signed)
[place] [facility]) Care Management  12/12/2014  Caleb Bauer 1950/10/10 [id number]   Assessment-CSW completed outreach to patient on 12/12/14 and received HIPPA verifications from patient. CSW questioned if he had received the senior wheel application and he declined. CSW encouraged him to continue to check mail regularly as it should be there by this week. CSW also informed patient that since he has medicaid, he can receive transportation with medicaid as well. CSW provided number to [facility] office where he can call and ask to speak to his caseworker about a transportation assessment.  Plan-CSW will contact patient next week to inquire on transportation referral.  .Eula Fried, BSW, MSW, [email or web address] Phone: [phone] Fax: [phone]

## 2014-12-14 ENCOUNTER — Ambulatory Visit: Payer: Self-pay

## 2014-12-14 NOTE — Addendum Note (Signed)
Addended by: Tobi Bastos on: 12/14/2014 09:55 AM   Modules accepted: Medications

## 2014-12-14 NOTE — Patient Outreach (Signed)
[place] [facility]) Care Management  12/14/2014  Caleb Bauer 12/22/1950 [id number]   Patient has been diagnosed with Class 4 Heart Failure.  Discussed important measure to take to prevent exacerbation.  The following  Signs and symptoms, need to walk/avoid sedentary lifestyles, compliance with medication and medical regimen.  Paitent is to talk to her physician about Class IV, as he does not believe he is Class 4.  Patient lost his wallet, discussed steps for obtaining repladcement of ID, Medicaid, Medicare Cards, special ID

## 2014-12-15 ENCOUNTER — Other Ambulatory Visit: Payer: Self-pay | Admitting: Licensed Clinical Social Worker

## 2014-12-15 NOTE — Patient Outreach (Signed)
[facility] [facility]) Care Management  12/15/2014  BARRINGTON WORLEY August 22, 1950 [id number]  Assessment-CSW made outreach to patient on 12/15/14 and received HIPPA verifications from patient. RNCM [name] stated that patient had not received Barrister's clerk. CSW contacted [employer] to make another referral but Training and development officer stated that she did receive the consents from patient and that he is now eligible for services. CSW contacted patient who stated he never received those papers. CSW encouraged patient to contact [employer] to question this as he is already deemed eligible for program and can now start receiving services per [employer]. CSW provided contact information and reviewed their transportation arrangement policy with patient.  Plan-CSW will contact patient in 2 weeks to check on transportation barrier status.   Eula Fried, BSW, MSW, [email or web address] Phone: [phone] Fax: [phone]

## 2014-12-22 ENCOUNTER — Ambulatory Visit: Payer: Medicare Other | Admitting: Gastroenterology

## 2015-01-02 ENCOUNTER — Other Ambulatory Visit: Payer: Self-pay

## 2015-01-03 ENCOUNTER — Other Ambulatory Visit: Payer: Self-pay | Admitting: Licensed Clinical Social Worker

## 2015-01-03 NOTE — Patient Outreach (Signed)
[place] [facility]) Care Management  01/03/2015  Caleb Bauer 1950-06-08 [id number]   Assessment-CSW completed outreach to patient on 01/03/15. Patient answered and provided HIPPA verifications. Patient reports that he has not used [employer] yet and has lost the contact information I provided him last time. CSW provided contact information again and encouraged him to keep information in a safe and visible place. CSW reviewed Investment banker, operational and their policies for arranging stable transportation. Patient agreeable to contacting office 7-10 business days before a medical appointment to schedule transportation. Patient denies any further social work needs at this time. Patient agreeable to social work discharge. All social work goals met.   Plan-CSW will inform RNCM [name] of discharge.  Eula Fried, BSW, MSW, [email or web address] Phone: [phone] Fax: [phone]

## 2015-01-04 ENCOUNTER — Ambulatory Visit: Payer: Medicare Other

## 2015-01-04 ENCOUNTER — Encounter: Payer: Self-pay | Admitting: Licensed Clinical Social Worker

## 2015-01-09 NOTE — Patient Outreach (Signed)
[name] [facility]) Care Management  01/09/2015  Caleb Bauer 1950/09/19 [id number]   Notification from disciplines involved to close case as goals met with [place] Management.  Thanks, Ronnell Freshwater. [place], [place] Assistant Phone: [phone] Fax: [phone]

## 2015-03-15 ENCOUNTER — Other Ambulatory Visit: Payer: Self-pay

## 2015-04-23 ENCOUNTER — Other Ambulatory Visit: Payer: Self-pay | Admitting: Surgery

## 2015-04-23 NOTE — H&P (Signed)
Yvonna Alanis 04/23/2015 8:41 AM Location: [facility] Patient #: [id number] DOB: [birth year] Single / Language: [name] / Race: Black or African American Male  History of Present Illness [name] MD; 04/23/2015 9:27 AM) Patient words: hernia.  The patient is a 65 year old male who presents with an umbilical hernia. Note for "Umbilical hernia": Patient sent for surgical consultation by Dr. Luciana Axe with [facility] physicians. Concern for symptomatic periumbilical ventral wall hernia.  Pleasant male. Comes today with related friend. Has a history of hernias around his belly button. Recalls requiring at least 2 operations in the past. He is to live in [place] but lives in [place] now. Fusion about one the surgeries were. He thinks the first one was in the 61 nineties. He then send the next surgery was a couple years later. But his friend wondered if that happened just a few years ago. No records in the region by [facility] everywhere for the past 10 years.  Nonetheless he's noted some bulging above his belly button that concern for recurrent hernia. Bulging above his belly button a Little but for his prior hernia was. This got larger and more sensitive over the past month or so. Have cardiomyopathy is anticoagulated on Eliquis. Followed by cardiology. Echocardiogram January 2016 shows ejection fraction 15-20 percent. Patient claims he can walk on a for a half hour without difficulty. He no longer smokes. Some occasional constipation. Takes intermittent laxatives. Recalls getting colonoscopies in the past. Last one had a few polyps removed.  He notes that hernias really bothering him is wondering if the mesh had expired. He is concerned that he can get and more trouble in which to have it repaired. Primary care office agreed and requested surgical evaluation.   Other Problems Davy Pique Bynum, CMA; 04/23/2015 8:41 AM) Congestive Heart Failure Diabetes  Mellitus High blood pressure  Past Surgical History Marjean Donna, CMA; 04/23/2015 8:41 AM) Colon Polyp Removal - Colonoscopy  Allergies Davy Pique Bynum, CMA; 04/23/2015 8:42 AM) No Known Drug Allergies 04/23/2015  Medication History (Sonya Bynum, CMA; 04/23/2015 8:44 AM) Demadex (20MG  Tablet, Oral) Active. Eliquis (2.5MG  Tablet, Oral) Active. Allopurinol (100MG  Tablet, Oral) Active. Coreg (3.125MG  Tablet, Oral) Active. Amiodarone HCl (200MG  Tablet, Oral) Active. Colchicine (0.6MG  Capsule, Oral) Active. Digoxin (250MCG Tablet, Oral) Active. HydrALAZINE HCl (100MG  Tablet, Oral) Active. TraMADol HCl (50MG  Tablet, Oral) Active. Lisinopril (2.5MG  Tablet, Oral) Active. Medications Reconciled  Social History Marjean Donna, CMA; 04/23/2015 8:41 AM) Alcohol use Remotely quit alcohol use. Caffeine use Coffee, Tea. Tobacco use Former smoker.  Family History Marjean Donna, [place]; 04/23/2015 8:41 AM) Arthritis Mother. Diabetes Mellitus Brother.     Review of Systems Davy Pique Bynum CMA; 04/23/2015 8:41 AM) General Not Present- Appetite Loss, Chills, Fatigue, Fever, Night Sweats, Weight Gain and Weight Loss. Skin Not Present- Change in Wart/Mole, Dryness, Hives, Jaundice, New Lesions, Non-Healing Wounds, Rash and Ulcer. HEENT Not Present- Earache, Hearing Loss, Hoarseness, Nose Bleed, Oral Ulcers, Ringing in the Ears, Seasonal Allergies, Sinus Pain, Sore Throat, Visual Disturbances, Wears glasses/contact lenses and Yellow Eyes. Respiratory Not Present- Bloody sputum, Chronic Cough, Difficulty Breathing, Snoring and Wheezing. Breast Not Present- Breast Mass, Breast Pain, Nipple Discharge and Skin Changes. Cardiovascular Not Present- Chest Pain, Difficulty Breathing Lying Down, Leg Cramps, Palpitations, Rapid Heart Rate, Shortness of Breath and Swelling of Extremities. Gastrointestinal Not Present- Abdominal Pain, Bloating, Bloody Stool, Change in Bowel Habits, Chronic diarrhea,  Constipation, Difficulty Swallowing, Excessive gas, Gets full quickly at meals, Hemorrhoids, Indigestion, Nausea, Rectal Pain and Vomiting.  Male Genitourinary Present- Impotence and Nocturia. Not Present- Blood in Urine, Change in Urinary Stream, Frequency, Painful Urination, Urgency and Urine Leakage.  Vitals (Sonya Bynum CMA; 04/23/2015 8:42 AM) 04/23/2015 8:41 AM Weight: 264 lb Height: 72in Body Surface Area: 2.4 m Body Mass Index: 35.8 kg/m  Temp.: 29F(Temporal)  Pulse: 77 (Regular)  BP: 130/80 (Sitting, Left Arm, Standard)      Physical Exam [name] MD; 04/23/2015 9:17 AM)  General Mental Status-Alert. General Appearance-Not in acute distress, Not Sickly. Orientation-Oriented X3. Hydration-Well hydrated. Voice-Normal. Note: Pleasant, chatty  Integumentary Global Assessment Upon inspection and palpation of skin surfaces of the - Axillae: non-tender, no inflammation or ulceration, no drainage. and Distribution of scalp and body hair is normal. General Characteristics Temperature - normal warmth is noted. Note: Blue press on fingernails on both hands  Head and Neck Head-normocephalic, atraumatic with no lesions or palpable masses. Face Global Assessment - atraumatic, no absence of expression. Neck Global Assessment - no abnormal movements, no bruit auscultated on the right, no bruit auscultated on the left, no decreased range of motion, non-tender. Trachea-midline. Thyroid Gland Characteristics - non-tender.  Eye Eyeball - Left-Extraocular movements intact, No Nystagmus. Eyeball - Right-Extraocular movements intact, No Nystagmus. Cornea - Left-No Hazy. Cornea - Right-No Hazy. Sclera/Conjunctiva - Left-No scleral icterus, No Discharge. Sclera/Conjunctiva - Right-No scleral icterus, No Discharge. Pupil - Left-Direct reaction to light normal. Pupil - Right-Direct reaction to light normal.  ENMT Ears Pinna - Left  - no drainage observed, no generalized tenderness observed. Right - no drainage observed, no generalized tenderness observed. Nose and Sinuses External Inspection of the Nose - no destructive lesion observed. Inspection of the nares - Left - quiet respiration. Right - quiet respiration. Mouth and Throat Lips - Upper Lip - no fissures observed, no pallor noted. Lower Lip - no fissures observed, no pallor noted. Nasopharynx - no discharge present. Oral Cavity/Oropharynx - Tongue - no dryness observed. Oral Mucosa - no cyanosis observed. Hypopharynx - no evidence of airway distress observed. Note: Many piercings. Good hygiene.  Chest and Lung Exam Inspection Movements - Normal and Symmetrical. Accessory muscles - No use of accessory muscles in breathing. Palpation Palpation of the chest reveals - Non-tender. Auscultation Breath sounds - Normal and Clear.  Cardiovascular Auscultation Rhythm - Regular. Murmurs & Other Heart Sounds - Auscultation of the heart reveals - No Murmurs and No Systolic Clicks.  Abdomen Inspection Inspection of the abdomen reveals - No Visible peristalsis and No Abnormal pulsations. Umbilicus - No Bleeding, No Urine drainage. Palpation/Percussion Palpation and Percussion of the abdomen reveal - Soft, Non Tender, No Rebound tenderness, No Rigidity (guarding) and No Cutaneous hyperesthesia. Note: Moderate diastases recti. 7 cm mass supraumbilical reducible. Consistent with hernia. Transverse umbilical incision intact. No umbilical hernia.  Male Genitourinary Sexual Maturity Tanner 5 - Adult hair pattern and Adult penile size and shape. Note: Normal external male genitalia. No inguinal hernias.  Peripheral Vascular Upper Extremity Inspection - Left - No Cyanotic nailbeds, Not Ischemic. Right - No Cyanotic nailbeds, Not Ischemic.  Neurologic Neurologic evaluation reveals -normal attention span and ability to concentrate, able to name objects and repeat  phrases. Appropriate fund of knowledge , normal sensation and normal coordination. Mental Status Affect - not angry, not paranoid. Cranial Nerves-Normal Bilaterally. Gait-Normal.  Neuropsychiatric Mental status exam performed with findings of-able to articulate well with normal speech/language, rate, volume and coherence, thought content normal with ability to perform basic computations and apply abstract reasoning and no evidence of hallucinations,  delusions, obsessions or homicidal/suicidal ideation.  Musculoskeletal Global Assessment Spine, Ribs and Pelvis - no instability, subluxation or laxity. Right Upper Extremity - no instability, subluxation or laxity.  Lymphatic Head & Neck  General Head & Neck Lymphatics: Bilateral - Description - No Localized lymphadenopathy. Axillary  General Axillary Region: Bilateral - Description - No Localized lymphadenopathy. Femoral & Inguinal  Generalized Femoral & Inguinal Lymphatics: Left - Description - No Localized lymphadenopathy. Right - Description - No Localized lymphadenopathy.    Assessment & Plan [name] MD; 04/23/2015 9:28 AM)  VENTRAL HERNIA WITHOUT OBSTRUCTION OR GANGRENE (K43.9) Impression: Obvious bulge in supraumbilical region. Suspect he has either recurrence on the upper edge of his prior mesh repair periumbilical aureus a new hernia. Also has diastases recti.  I would do a laparoscopic underlay repair with primary repair to minimize recurrence given his obesity.  Would definitely like cardiac clearance to make sure it safe to come off his Eliquis. Cardiologist Dr. Einar Gip. His operative risks are higher than average if his ejection fraction really is down in the twenties. However since he no longer smokes and is exercising more, perhaps improved. He definitely wanted make sure that risks are low before considering elective repair.  ENCOUNTER FOR PRE-OPERATIVE EXAMINATION - [place] RR:[id number])  Current Plans You are  being scheduled for surgery - Our schedulers will call you.  You should hear from our office's scheduling department within 5 working days about the location, date, and time of surgery. We try to make accommodations for patient's preferences in scheduling surgery, but sometimes the OR schedule or the surgeon's schedule prevents [place] from making those accommodations.  If you have not heard from our office [phone]) in 5 working days, call the office and ask for your surgeon's nurse.  If you have other questions about your diagnosis, plan, or surgery, call the office and ask for your surgeon's nurse.  Written instructions provided The anatomy & physiology of the abdominal wall was discussed. The pathophysiology of hernias was discussed. Natural history risks without surgery including progeressive enlargement, pain, incarceration, & strangulation was discussed. Contributors to complications such as smoking, obesity, diabetes, prior surgery, etc were discussed.  I feel the risks of no intervention will lead to serious problems that outweigh the operative risks; therefore, I recommended surgery to reduce and repair the hernia. I explained laparoscopic techniques with possible need for an open approach. I noted the probable use of mesh to patch and/or buttress the hernia repair  Risks such as bleeding, infection, abscess, need for further treatment, heart attack, death, and other risks were discussed. I noted a good likelihood this will help address the problem. Goals of post-operative recovery were discussed as well. Possibility that this will not correct all symptoms was explained. I stressed the importance of low-impact activity, aggressive pain control, avoiding constipation, & not pushing through pain to minimize risk of post-operative chronic pain or injury. Possibility of reherniation especially with smoking, obesity, diabetes, immunosuppression, and other health conditions was discussed. We will  work to minimize complications.  An educational handout further explaining the pathology & treatment options was given as well. Questions were answered. The patient expresses understanding & wishes to proceed with surgery.  Pt Education - Pamphlet Given - Laparoscopic Hernia Repair: discussed with patient and provided information. Pt Education - [facility] Hernia Post-Op HCI (Marvina Danner): discussed with patient and provided information. Pt Education - [facility] Pain Control (Trenia Tennyson) ANTICOAGULATED (Z79.01) Impression: Make sure it safe to come off his blood thinners.  Current  Plans I recommended obtaining preoperative cardiac clearance. I am concerned about the health of the patient and the ability to tolerate the operation. Therefore, we will request clearance by cardiology to better assess operative risk & see if a reevaluation, further workup, etc is needed. Also recommendations on how medications such as for anticoagulation and blood pressure should be managed/held/restarted after surgery.  [name], M.D., F.A.C.S. Gastrointestinal and Minimally Invasive Surgery [facility] [address], [place], [place] [address] [phone] Main / Paging

## 2015-05-08 ENCOUNTER — Encounter: Payer: Self-pay | Admitting: Internal Medicine

## 2015-05-21 ENCOUNTER — Other Ambulatory Visit (HOSPITAL_COMMUNITY): Payer: Self-pay | Admitting: *Deleted

## 2015-05-21 NOTE — Pre-Procedure Instructions (Addendum)
Caleb Bauer  05/21/2015      [facility] - [address], [place] [address] Phone: [phone] Fax: [phone]    Your procedure is scheduled on Wednesday, May 30, 2015    Report to [facility] Entrance "A" Admitting Office at 10:00 AM.   Call this number if you have problems the morning of surgery: [phone]   Any questions prior to day of surgery, please call [phone] between 8 & 4 PM.   Remember:  Do not eat food or drink liquids after midnight Tuesday, 05/29/15.  Take these medicines the morning of surgery with A SIP OF WATER: Amiodarone (Pacerone), Carvedilol (Coreg), Digoxin (Lanoxin), Isosorbide Dinitrate (Isordil),allopurinol  STOP all herbel meds, nsaids (aleve,naproxen,advil,ibuprofen) 5 days prior to surgery starting 05/25/15 including vitamins(caffiene-magnesium)  Stop and Eliquis as instructed by your physician.stop 24 hrs last dose early Tuesday am.   Do not wear jewelry.no nail polish(patient called to remind to take polish off)  Do not wear lotions, powders, or cologne.  You may wear deodorant.  Men may shave face and neck.  Do not bring valuables to the hospital.  [facility] is not responsible for any belongings or valuables.  Contacts, dentures or bridgework may not be worn into surgery.  Leave your suitcase in the car.  After surgery it may be brought to your room.  For patients admitted to the hospital, discharge time will be determined by your treatment team.  Special instructions: [place] - Preparing for Surgery  Before surgery, you can play an important role.  Because skin is not sterile, your skin needs to be as free of germs as possible.  You can reduce the number of germs on you skin by washing with CHG (chlorahexidine gluconate) soap before surgery.  CHG is an antiseptic cleaner which kills germs and bonds with the skin to continue killing germs even  after washing.  Please DO NOT use if you have an allergy to CHG or antibacterial soaps.  If your skin becomes reddened/irritated stop using the CHG and inform your nurse when you arrive at Short Stay.  Do not shave (including legs and underarms) for at least 48 hours prior to the first CHG shower.  You may shave your face.  Please follow these instructions carefully:   1.  Shower with CHG Soap the night before surgery and the                                morning of Surgery.  2.  If you choose to wash your hair, wash your hair first as usual with your       normal shampoo.  3.  After you shampoo, rinse your hair and body thoroughly to remove the                      Shampoo.  4.  Use CHG as you would any other liquid soap.  You can apply chg directly       to the skin and wash gently with scrungie or a clean washcloth.  5.  Apply the CHG Soap to your body ONLY FROM THE NECK DOWN.        Do not use on open wounds or open sores.  Avoid contact with your eyes, ears, mouth and genitals (private parts).  Wash genitals (private  parts) with your normal soap.  6.  Wash thoroughly, paying special attention to the area where your surgery        will be performed.  7.  Thoroughly rinse your body with warm water from the neck down.  8.  DO NOT shower/wash with your normal soap after using and rinsing off       the CHG Soap.  9.  Pat yourself dry with a clean towel.            10.  Wear clean pajamas.            11.  Place clean sheets on your bed the night of your first shower and do not        sleep with pets.  Day of Surgery  Do not apply any lotions the morning of surgery.  Please wear clean clothes to the hospital.   Please read over the following fact sheets that you were given. Pain Booklet, Coughing and Deep Breathing and Surgical Site Infection Prevention

## 2015-05-22 ENCOUNTER — Encounter (HOSPITAL_COMMUNITY)
Admission: RE | Admit: 2015-05-22 | Discharge: 2015-05-22 | Disposition: A | Discharge status: Home / Self Care | Payer: Medicare Other | Source: Ambulatory Visit | Attending: Surgery | Admitting: Surgery

## 2015-05-22 ENCOUNTER — Encounter (HOSPITAL_COMMUNITY): Payer: Self-pay

## 2015-05-22 VITALS — BP 131/74 | HR 63 | Temp 97.5°F | Resp 20 | Ht 72.0 in | Wt 262.3 lb

## 2015-05-22 DIAGNOSIS — Z01818 Encounter for other preprocedural examination: Secondary | ICD-10-CM | POA: Insufficient documentation

## 2015-05-22 DIAGNOSIS — N183 Chronic kidney disease, stage 3 (moderate): Secondary | ICD-10-CM | POA: Diagnosis not present

## 2015-05-22 DIAGNOSIS — I5042 Chronic combined systolic (congestive) and diastolic (congestive) heart failure: Secondary | ICD-10-CM | POA: Insufficient documentation

## 2015-05-22 DIAGNOSIS — Z7901 Long term (current) use of anticoagulants: Secondary | ICD-10-CM | POA: Diagnosis not present

## 2015-05-22 DIAGNOSIS — Z79899 Other long term (current) drug therapy: Secondary | ICD-10-CM | POA: Insufficient documentation

## 2015-05-22 DIAGNOSIS — K469 Unspecified abdominal hernia without obstruction or gangrene: Secondary | ICD-10-CM | POA: Insufficient documentation

## 2015-05-22 DIAGNOSIS — J449 Chronic obstructive pulmonary disease, unspecified: Secondary | ICD-10-CM | POA: Diagnosis not present

## 2015-05-22 DIAGNOSIS — I130 Hypertensive heart and chronic kidney disease with heart failure and stage 1 through stage 4 chronic kidney disease, or unspecified chronic kidney disease: Secondary | ICD-10-CM | POA: Diagnosis not present

## 2015-05-22 DIAGNOSIS — Z01812 Encounter for preprocedural laboratory examination: Secondary | ICD-10-CM | POA: Diagnosis not present

## 2015-05-22 DIAGNOSIS — I429 Cardiomyopathy, unspecified: Secondary | ICD-10-CM | POA: Insufficient documentation

## 2015-05-22 DIAGNOSIS — M109 Gout, unspecified: Secondary | ICD-10-CM | POA: Diagnosis not present

## 2015-05-22 DIAGNOSIS — K219 Gastro-esophageal reflux disease without esophagitis: Secondary | ICD-10-CM | POA: Diagnosis not present

## 2015-05-22 DIAGNOSIS — Z87891 Personal history of nicotine dependence: Secondary | ICD-10-CM | POA: Diagnosis not present

## 2015-05-22 DIAGNOSIS — E119 Type 2 diabetes mellitus without complications: Secondary | ICD-10-CM

## 2015-05-22 DIAGNOSIS — D696 Thrombocytopenia, unspecified: Secondary | ICD-10-CM | POA: Diagnosis not present

## 2015-05-22 HISTORY — DX: Unspecified osteoarthritis, unspecified site: M19.90

## 2015-05-22 HISTORY — DX: Sleep apnea, unspecified: G47.30

## 2015-05-22 LAB — COMPREHENSIVE METABOLIC PANEL
ALBUMIN: 3.4 g/dL — AB (ref 3.5–5.0)
ALK PHOS: 84 U/L (ref 38–126)
ALT: 25 U/L (ref 17–63)
AST: 27 U/L (ref 15–41)
Anion gap: 12 (ref 5–15)
BILIRUBIN TOTAL: 0.5 mg/dL (ref 0.3–1.2)
BUN: 40 mg/dL — AB (ref 6–20)
CO2: 21 mmol/L — ABNORMAL LOW (ref 22–32)
Calcium: 9.2 mg/dL (ref 8.9–10.3)
Chloride: 111 mmol/L (ref 101–111)
Creatinine, Ser: 1.99 mg/dL — ABNORMAL HIGH (ref 0.61–1.24)
GFR calc Af Amer: 39 mL/min — ABNORMAL LOW (ref >60)
GFR, EST NON AFRICAN AMERICAN: 34 mL/min — AB (ref >60)
Glucose, Bld: 111 mg/dL — ABNORMAL HIGH (ref 65–99)
Potassium: 4.2 mmol/L (ref 3.5–5.1)
Sodium: 144 mmol/L (ref 135–145)
TOTAL PROTEIN: 8.2 g/dL — AB (ref 6.5–8.1)

## 2015-05-22 LAB — CBC
HCT: 36.7 % — ABNORMAL LOW (ref 39.0–52.0)
Hemoglobin: 11.5 g/dL — ABNORMAL LOW (ref 13.0–17.0)
MCH: 24.9 pg — ABNORMAL LOW (ref 26.0–34.0)
MCHC: 31.3 g/dL (ref 30.0–36.0)
MCV: 79.4 fL (ref 78.0–100.0)
Platelets: 98 10*3/uL — ABNORMAL LOW (ref 150–400)
RBC: 4.62 MIL/uL (ref 4.22–5.81)
RDW: 17.5 % — AB (ref 11.5–15.5)
WBC: 5.2 10*3/uL (ref 4.0–10.5)

## 2015-05-22 NOTE — Progress Notes (Addendum)
Anesthesia Note: Caleb Bauer is a 65 year old male scheduled for laparoscopic ventral wall hernia repair with mesh on 05/30/15 by Dr. Johney Maine.  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 BMI is consistent with obesity.   PCP is Dr. Precious Haws ([place]; Care Everywhere). He resides in a retirement community. He denied being followed by a nephrologist (was seen by Dr. Corliss Parish in 11/2012 during a hospitalization for acute on chronic KD).  Primary cardiologist is Dr. Adrian Prows with [facility]. Caleb Bauer was seen on 05/11/15 for cardiomyopathy follow-up and pre-operative evaluation. Caleb Bauer was cleared with INTERMEDIATE risk for perioperative CV complication. He has also been seen in [facility] (iniitally 05/2013 by Dr. Haroldine Laws; last seen by Dr. Aundra Dubin 07/04/13). He attempted a CPX at that time but became lightheaded after doing PFTS and was hypotensive so test not completed. There was also discussion of LHC/RHC consideration but was delayed due to his renal function and also need for further discussion with Dr. Einar Gip (primary cardiologist). Caleb Bauer told me (and Dr. Haroldine Laws in 2015) that he has never had a stress test or cardiac cath. Dr. Irven Shelling note mentions a "coronary angiogram in [place] in 2013 was told to be normal." I will request previous cath and/or stress test from [facility], as available. I don't see a formal EP consult, but according to 03/11/14 in-Caleb Bauer cardiology consult by Dr. Einar Gip, "...he is not a candidate for ICD implantation."  Meds include albuterol, allopurinol, amiodarone, Eliquis, Diurex, Coreg, colchicine, digoxin, Isordil, Zaroxolyn, torsemide, tramadol. Per Dr. Einar Gip, stop Eliquis for 24 hours before surgery (Caleb Bauer aware) and restart post-procedure 12-24 hours later.   PAT Vitals: BP 131/74, HR 63, RR 20, T 36.4C, O2 sat 100%. Exam shows a pleasant Black male in NAD. No conversational dyspnea. Heart RRR, I/VI SEM. Lung clear. No carotid bruits auscultated. No significant pretibial edema. He denied chest pain, SOB at rest, significant weight fluctuations, syncope, palpitations. He was able to walk from the hospital entrance to PAT without stopping. He can keep a steady pace while shopping at [place]. He does not have stairs to climb.   06/01/15 EKG (Dr. Einar Gip): SR, first degree AVB, LAE, right BBB, LAD/LAFB. Tri-fasicular block. Non-specific ST/T wave abnormality. Dr. Einar Gip felt tracing was stable when compared to tracings from 02/15/15 and 07/26/14.  03/23/15 Echo [facility]): Poor echo window, cannot r/o regional wall motion abnormality. LV cavity is severely dilated. Moderate concentric LVH. Severe decrease in LV systolic function. Doppler evidence of grade I (impaired) diastolic dysfunction. Calculate EF 21%. LA cavity is moderate to severely dilated. Mitral annular dilatation. Mild MR. No significant change since 02/03/13. (Previous EF 15-20% 03/12/14, 20-25% 03/10/13 and 09/13/12.)   07/24/14 CXR: IMPRESSION: Central mild vascular congestion. No convincing pulmonary edema. Cardiomegaly. Streaky left base retrocardiac atelectasis or infiltrate  Preoperative labs noted. BUN/Cr 40/1.99. Based lab results and records dating back to 03/2013, his Cr has been ~ 1.73-2.75, with most recent comparison of BUN  50, Cr 2.13 on 04/12/15 (Dr. Einar Gip). Glucose 111. H/H  11.5/36.7. PLT 98K (history of chronic thrombocytopenia, although previously PLT > 100K since 03/2013. A1c pending (was 6.7% on 04/12/15, Dr. Einar Gip). Labs are marked as reviewed by Dr. Johney Maine.  I'll follow-up additional records, if received. Reviewed above with anesthesiologist Dr. Therisa Doyne. Caleb Bauer with known cardiomyopathy. EF < 25% since at least 2014. Thought to be non-ischemic, ETOH related. He is now sober. Not a candidate for ICD per Dr. Irven Shelling notes. He is seeing Caleb Bauer about every two months for now to improve his compliance. Caleb Bauer without acute CV/CHF issues when I evaluated him today. Dr. Einar Gip has cleared Caleb Bauer with intermediate risk. Caleb Bauer instructed on Eliquis. Dr. Ola Spurr is okay with him holding diuretics on the day of surgery. Caleb Bauer will likely need an a-line. Further evaluation by his anesthesiologist on the day of surgery to ensure no acute changes prior to proceeding.  George Hugh [facility]/Anesthesiology Phone [phone] 05/22/2015 4:29 PM  Addendum: No cardiac records on this Caleb Bauer at [facility]. Tish Men [facility] only sent a 2014 echo (no stress or cath), which is consistent with what that Caleb Bauer reported to me.  A1c 6.4%. No additional recommendations.  George Hugh [facility] Stay [facility]/Anesthesiology Phone [phone] 05/23/2015 9:23 AM

## 2015-05-22 NOTE — Progress Notes (Signed)
errror

## 2015-05-23 LAB — HEMOGLOBIN A1C
Hgb A1c MFr Bld: 6.4 % — ABNORMAL HIGH (ref 4.8–5.6)
MEAN PLASMA GLUCOSE: 137 mg/dL

## 2015-05-29 MED ORDER — CEFAZOLIN SODIUM-DEXTROSE 2-4 GM/100ML-% IV SOLN
2.0000 g | INTRAVENOUS | Status: AC
  Administered 2015-05-30: 2 g via INTRAVENOUS
  Filled 2015-05-29: qty 100

## 2015-05-30 ENCOUNTER — Encounter (HOSPITAL_COMMUNITY): Payer: Self-pay | Admitting: Anesthesiology

## 2015-05-30 ENCOUNTER — Encounter (HOSPITAL_COMMUNITY)
Admission: RE | Disposition: A | Discharge status: Home / Self Care | Payer: Self-pay | Source: Ambulatory Visit | Attending: Surgery

## 2015-05-30 ENCOUNTER — Observation Stay (HOSPITAL_COMMUNITY)
Admission: RE | Admit: 2015-05-30 | Discharge: 2015-06-01 | Disposition: A | Discharge status: Home / Self Care | Payer: Medicare Other | Source: Ambulatory Visit | Attending: Surgery | Admitting: Surgery

## 2015-05-30 ENCOUNTER — Ambulatory Visit (HOSPITAL_COMMUNITY): Payer: Medicare Other | Admitting: Vascular Surgery

## 2015-05-30 ENCOUNTER — Ambulatory Visit (HOSPITAL_COMMUNITY): Payer: Medicare Other | Admitting: Anesthesiology

## 2015-05-30 DIAGNOSIS — K56600 Partial intestinal obstruction, unspecified as to cause: Secondary | ICD-10-CM

## 2015-05-30 DIAGNOSIS — I5042 Chronic combined systolic (congestive) and diastolic (congestive) heart failure: Secondary | ICD-10-CM | POA: Insufficient documentation

## 2015-05-30 DIAGNOSIS — I119 Hypertensive heart disease without heart failure: Secondary | ICD-10-CM | POA: Diagnosis present

## 2015-05-30 DIAGNOSIS — G4730 Sleep apnea, unspecified: Secondary | ICD-10-CM | POA: Diagnosis not present

## 2015-05-30 DIAGNOSIS — K219 Gastro-esophageal reflux disease without esophagitis: Secondary | ICD-10-CM | POA: Diagnosis not present

## 2015-05-30 DIAGNOSIS — Z87891 Personal history of nicotine dependence: Secondary | ICD-10-CM | POA: Insufficient documentation

## 2015-05-30 DIAGNOSIS — J449 Chronic obstructive pulmonary disease, unspecified: Secondary | ICD-10-CM | POA: Diagnosis present

## 2015-05-30 DIAGNOSIS — K436 Other and unspecified ventral hernia with obstruction, without gangrene: Secondary | ICD-10-CM | POA: Diagnosis present

## 2015-05-30 DIAGNOSIS — I429 Cardiomyopathy, unspecified: Secondary | ICD-10-CM | POA: Diagnosis not present

## 2015-05-30 DIAGNOSIS — I110 Hypertensive heart disease with heart failure: Secondary | ICD-10-CM | POA: Insufficient documentation

## 2015-05-30 DIAGNOSIS — K430 Incisional hernia with obstruction, without gangrene: Secondary | ICD-10-CM

## 2015-05-30 DIAGNOSIS — M109 Gout, unspecified: Secondary | ICD-10-CM | POA: Diagnosis present

## 2015-05-30 DIAGNOSIS — Z7901 Long term (current) use of anticoagulants: Secondary | ICD-10-CM | POA: Diagnosis not present

## 2015-05-30 DIAGNOSIS — E119 Type 2 diabetes mellitus without complications: Secondary | ICD-10-CM

## 2015-05-30 DIAGNOSIS — Z8719 Personal history of other diseases of the digestive system: Secondary | ICD-10-CM

## 2015-05-30 DIAGNOSIS — I5041 Acute combined systolic (congestive) and diastolic (congestive) heart failure: Secondary | ICD-10-CM | POA: Diagnosis present

## 2015-05-30 DIAGNOSIS — I5043 Acute on chronic combined systolic (congestive) and diastolic (congestive) heart failure: Secondary | ICD-10-CM | POA: Diagnosis present

## 2015-05-30 DIAGNOSIS — Z9889 Other specified postprocedural states: Secondary | ICD-10-CM

## 2015-05-30 HISTORY — PX: INSERTION OF MESH: SHX5868

## 2015-05-30 HISTORY — DX: Acute kidney failure, unspecified: N17.9

## 2015-05-30 HISTORY — DX: Incisional hernia with obstruction, without gangrene: K43.0

## 2015-05-30 HISTORY — PX: VENTRAL HERNIA REPAIR: SHX424

## 2015-05-30 HISTORY — DX: Enterocolitis due to Clostridium difficile, not specified as recurrent: A04.72

## 2015-05-30 HISTORY — DX: Encephalopathy, unspecified: G93.40

## 2015-05-30 HISTORY — DX: Partial intestinal obstruction, unspecified as to cause: K56.600

## 2015-05-30 HISTORY — DX: Zoster without complications: B02.9

## 2015-05-30 HISTORY — DX: Urinary tract infection, site not specified: N39.0

## 2015-05-30 HISTORY — DX: Pneumonia, unspecified organism: J18.9

## 2015-05-30 HISTORY — PX: LYSIS OF ADHESION: SHX5961

## 2015-05-30 HISTORY — DX: Personal history of other diseases of the digestive system: Z87.19

## 2015-05-30 HISTORY — DX: Gout, unspecified: M10.9

## 2015-05-30 LAB — GLUCOSE, CAPILLARY
GLUCOSE-CAPILLARY: 86 mg/dL (ref 65–99)
GLUCOSE-CAPILLARY: 108 mg/dL — AB (ref 65–99)
GLUCOSE-CAPILLARY: 114 mg/dL — AB (ref 65–99)
Glucose-Capillary: 89 mg/dL (ref 65–99)

## 2015-05-30 SURGERY — REPAIR, HERNIA, VENTRAL, LAPAROSCOPIC
Anesthesia: General | Site: Abdomen

## 2015-05-30 MED ORDER — ACETAMINOPHEN 500 MG PO TABS
1000.0000 mg | ORAL_TABLET | Freq: Three times a day (TID) | ORAL | Status: DC
  Administered 2015-05-30 (×2): 1000 mg via ORAL
  Filled 2015-05-30 (×2): qty 2

## 2015-05-30 MED ORDER — PHENYLEPHRINE HCL 10 MG/ML IJ SOLN
10.0000 mg | INTRAVENOUS | Status: DC | PRN
  Administered 2015-05-30: 20 ug/min via INTRAVENOUS

## 2015-05-30 MED ORDER — HYDRALAZINE HCL 20 MG/ML IJ SOLN: 10.0000 mg | INTRAMUSCULAR | Status: DC | PRN

## 2015-05-30 MED ORDER — BUPIVACAINE LIPOSOME 1.3 % IJ SUSP
20.0000 mL | INTRAMUSCULAR | Status: AC
  Administered 2015-05-30: 20 mL
  Filled 2015-05-30: qty 20

## 2015-05-30 MED ORDER — INSULIN ASPART 100 UNIT/ML ~~LOC~~ SOLN: 0.0000 [IU] | Freq: Every day | SUBCUTANEOUS | Status: DC

## 2015-05-30 MED ORDER — MAGIC MOUTHWASH: 15.0000 mL | Freq: Four times a day (QID) | ORAL | Status: DC | PRN

## 2015-05-30 MED ORDER — DIGOXIN 125 MCG PO TABS
125.0000 ug | ORAL_TABLET | ORAL | Status: DC
  Administered 2015-05-31: 125 ug via ORAL
  Filled 2015-05-30 (×2): qty 1

## 2015-05-30 MED ORDER — ROCURONIUM BROMIDE 100 MG/10ML IV SOLN
INTRAVENOUS | Status: DC | PRN
  Administered 2015-05-30: 50 mg via INTRAVENOUS

## 2015-05-30 MED ORDER — LIDOCAINE HCL (CARDIAC) 20 MG/ML IV SOLN
INTRAVENOUS | Status: DC | PRN
  Administered 2015-05-30: 70 mg via INTRAVENOUS

## 2015-05-30 MED ORDER — DIPHENHYDRAMINE HCL 50 MG/ML IJ SOLN: 12.5000 mg | Freq: Four times a day (QID) | INTRAMUSCULAR | Status: DC | PRN

## 2015-05-30 MED ORDER — FENTANYL CITRATE (PF) 250 MCG/5ML IJ SOLN
INTRAMUSCULAR | Status: AC
  Filled 2015-05-30: qty 5

## 2015-05-30 MED ORDER — MIDAZOLAM HCL 5 MG/5ML IJ SOLN
INTRAMUSCULAR | Status: DC | PRN
Start: 2015-05-30 — End: 2015-05-30
  Administered 2015-05-30: 2 mg via INTRAVENOUS

## 2015-05-30 MED ORDER — METOPROLOL TARTRATE 1 MG/ML IV SOLN: 5.0000 mg | Freq: Four times a day (QID) | INTRAVENOUS | Status: DC | PRN

## 2015-05-30 MED ORDER — AMIODARONE HCL 200 MG PO TABS
200.0000 mg | ORAL_TABLET | Freq: Every day | ORAL | Status: DC
  Administered 2015-05-31 – 2015-06-01 (×2): 200 mg via ORAL
  Filled 2015-05-30 (×2): qty 1

## 2015-05-30 MED ORDER — ALLOPURINOL 100 MG PO TABS
100.0000 mg | ORAL_TABLET | Freq: Every day | ORAL | Status: DC
  Administered 2015-05-31 – 2015-06-01 (×2): 100 mg via ORAL
  Filled 2015-05-30 (×2): qty 1

## 2015-05-30 MED ORDER — LIDOCAINE HCL (CARDIAC) 20 MG/ML IV SOLN
INTRAVENOUS | Status: AC
  Filled 2015-05-30: qty 5

## 2015-05-30 MED ORDER — DIPHENHYDRAMINE HCL 12.5 MG/5ML PO ELIX: 12.5000 mg | ORAL_SOLUTION | Freq: Four times a day (QID) | ORAL | Status: DC | PRN

## 2015-05-30 MED ORDER — MENTHOL 3 MG MT LOZG
1.0000 | LOZENGE | OROMUCOSAL | Status: DC | PRN
  Administered 2015-05-31: 3 mg via ORAL
  Filled 2015-05-30: qty 9

## 2015-05-30 MED ORDER — COLCHICINE 0.6 MG PO TABS
0.6000 mg | ORAL_TABLET | Freq: Two times a day (BID) | ORAL | Status: DC | PRN
  Administered 2015-05-31: 0.6 mg via ORAL
  Filled 2015-05-30: qty 1

## 2015-05-30 MED ORDER — DEXAMETHASONE SODIUM PHOSPHATE 4 MG/ML IJ SOLN
INTRAMUSCULAR | Status: AC
  Filled 2015-05-30: qty 2

## 2015-05-30 MED ORDER — ISOSORBIDE DINITRATE 10 MG PO TABS
10.0000 mg | ORAL_TABLET | Freq: Three times a day (TID) | ORAL | Status: DC
  Administered 2015-05-30 – 2015-06-01 (×5): 10 mg via ORAL
  Filled 2015-05-30 (×10): qty 1

## 2015-05-30 MED ORDER — METHOCARBAMOL 500 MG PO TABS: 500.0000 mg | ORAL_TABLET | Freq: Four times a day (QID) | ORAL | Status: DC | PRN

## 2015-05-30 MED ORDER — HYDROMORPHONE HCL 1 MG/ML IJ SOLN
0.2500 mg | INTRAMUSCULAR | Status: DC | PRN
  Administered 2015-05-30 (×2): 0.5 mg via INTRAVENOUS

## 2015-05-30 MED ORDER — NEOSTIGMINE METHYLSULFATE 10 MG/10ML IV SOLN
INTRAVENOUS | Status: DC | PRN
  Administered 2015-05-30: 3 mg via INTRAVENOUS

## 2015-05-30 MED ORDER — LIP MEDEX EX OINT
1.0000 "application " | TOPICAL_OINTMENT | Freq: Two times a day (BID) | CUTANEOUS | Status: DC
  Filled 2015-05-30: qty 7

## 2015-05-30 MED ORDER — CARVEDILOL 3.125 MG PO TABS
3.1250 mg | ORAL_TABLET | Freq: Two times a day (BID) | ORAL | Status: DC
  Administered 2015-05-30 – 2015-06-01 (×4): 3.125 mg via ORAL
  Filled 2015-05-30 (×4): qty 1

## 2015-05-30 MED ORDER — METOLAZONE 5 MG PO TABS
5.0000 mg | ORAL_TABLET | Freq: Every day | ORAL | Status: DC
  Administered 2015-05-30 – 2015-06-01 (×3): 5 mg via ORAL
  Filled 2015-05-30 (×6): qty 1

## 2015-05-30 MED ORDER — LACTATED RINGERS IV BOLUS (SEPSIS): 1000.0000 mL | Freq: Three times a day (TID) | INTRAVENOUS | Status: DC | PRN

## 2015-05-30 MED ORDER — ETOMIDATE 2 MG/ML IV SOLN
INTRAVENOUS | Status: DC | PRN
  Administered 2015-05-30: 10 mg via INTRAVENOUS

## 2015-05-30 MED ORDER — SODIUM CHLORIDE 0.9 % IV SOLN
250.0000 mL | INTRAVENOUS | Status: DC | PRN
  Administered 2015-05-30: 250 mL via INTRAVENOUS

## 2015-05-30 MED ORDER — GLYCOPYRROLATE 0.2 MG/ML IJ SOLN
INTRAMUSCULAR | Status: DC | PRN
  Administered 2015-05-30: 0.2 mg via INTRAVENOUS
  Administered 2015-05-30: 0.6 mg via INTRAVENOUS

## 2015-05-30 MED ORDER — ONDANSETRON HCL 4 MG/2ML IJ SOLN
INTRAMUSCULAR | Status: DC | PRN
  Administered 2015-05-30: 4 mg via INTRAVENOUS

## 2015-05-30 MED ORDER — SODIUM CHLORIDE 0.9% FLUSH
3.0000 mL | Freq: Two times a day (BID) | INTRAVENOUS | Status: DC
Start: 2015-05-30 — End: 2015-06-01
  Administered 2015-05-31 – 2015-06-01 (×2): 3 mL via INTRAVENOUS

## 2015-05-30 MED ORDER — MIDAZOLAM HCL 2 MG/2ML IJ SOLN
INTRAMUSCULAR | Status: AC
  Filled 2015-05-30: qty 2

## 2015-05-30 MED ORDER — PHENOL 1.4 % MT LIQD: 2.0000 | OROMUCOSAL | Status: DC | PRN

## 2015-05-30 MED ORDER — SODIUM CHLORIDE 0.9 % IV SOLN
Freq: Once | INTRAVENOUS | Status: AC
  Administered 2015-05-30: 1000 mL
  Filled 2015-05-30: qty 1000

## 2015-05-30 MED ORDER — DIPHENHYDRAMINE HCL 50 MG/ML IJ SOLN
12.5000 mg | Freq: Four times a day (QID) | INTRAMUSCULAR | Status: DC | PRN
Start: 2015-05-30 — End: 2015-06-01

## 2015-05-30 MED ORDER — ALUM & MAG HYDROXIDE-SIMETH 200-200-20 MG/5ML PO SUSP
30.0000 mL | Freq: Four times a day (QID) | ORAL | Status: DC | PRN
  Administered 2015-05-31: 30 mL via ORAL
  Filled 2015-05-30: qty 30

## 2015-05-30 MED ORDER — APIXABAN 5 MG PO TABS
5.0000 mg | ORAL_TABLET | Freq: Two times a day (BID) | ORAL | Status: DC
  Administered 2015-05-31 – 2015-06-01 (×3): 5 mg via ORAL
  Filled 2015-05-30 (×3): qty 1

## 2015-05-30 MED ORDER — ENOXAPARIN SODIUM 40 MG/0.4ML ~~LOC~~ SOLN: 40.0000 mg | SUBCUTANEOUS | Status: DC

## 2015-05-30 MED ORDER — ONDANSETRON HCL 4 MG/2ML IJ SOLN
4.0000 mg | Freq: Four times a day (QID) | INTRAMUSCULAR | Status: DC | PRN
  Administered 2015-05-31: 4 mg via INTRAVENOUS
  Filled 2015-05-30: qty 2

## 2015-05-30 MED ORDER — ONDANSETRON HCL 4 MG/2ML IJ SOLN
INTRAMUSCULAR | Status: AC
Start: 2015-05-30 — End: 2015-05-30
  Filled 2015-05-30: qty 2

## 2015-05-30 MED ORDER — TORSEMIDE 20 MG PO TABS
40.0000 mg | ORAL_TABLET | ORAL | Status: DC
  Administered 2015-05-31: 40 mg via ORAL
  Filled 2015-05-30: qty 2

## 2015-05-30 MED ORDER — ROCURONIUM BROMIDE 50 MG/5ML IV SOLN
INTRAVENOUS | Status: AC
  Filled 2015-05-30: qty 1

## 2015-05-30 MED ORDER — INSULIN ASPART 100 UNIT/ML ~~LOC~~ SOLN: 0.0000 [IU] | Freq: Three times a day (TID) | SUBCUTANEOUS | Status: DC

## 2015-05-30 MED ORDER — SODIUM CHLORIDE 0.9% FLUSH: 3.0000 mL | INTRAVENOUS | Status: DC | PRN

## 2015-05-30 MED ORDER — HYDROMORPHONE HCL 1 MG/ML IJ SOLN: 0.5000 mg | INTRAMUSCULAR | Status: DC | PRN

## 2015-05-30 MED ORDER — BUPIVACAINE-EPINEPHRINE (PF) 0.25% -1:200000 IJ SOLN
INTRAMUSCULAR | Status: AC
  Filled 2015-05-30: qty 90

## 2015-05-30 MED ORDER — BUPIVACAINE-EPINEPHRINE 0.25% -1:200000 IJ SOLN
INTRAMUSCULAR | Status: DC | PRN
  Administered 2015-05-30: 90 mL

## 2015-05-30 MED ORDER — DEXTROSE 5 % IV SOLN
1000.0000 mg | Freq: Four times a day (QID) | INTRAVENOUS | Status: DC | PRN
  Filled 2015-05-30: qty 10

## 2015-05-30 MED ORDER — LACTATED RINGERS IV SOLN
INTRAVENOUS | Status: DC
  Administered 2015-05-30 (×2): via INTRAVENOUS

## 2015-05-30 MED ORDER — ONDANSETRON 4 MG PO TBDP
4.0000 mg | ORAL_TABLET | Freq: Four times a day (QID) | ORAL | Status: DC | PRN
  Administered 2015-05-30 – 2015-05-31 (×2): 4 mg via ORAL
  Filled 2015-05-30 (×2): qty 1

## 2015-05-30 MED ORDER — TRAMADOL HCL 50 MG PO TABS
50.0000 mg | ORAL_TABLET | Freq: Four times a day (QID) | ORAL | Status: DC | PRN
  Administered 2015-06-01: 100 mg via ORAL
  Filled 2015-05-30: qty 2

## 2015-05-30 MED ORDER — HYDROMORPHONE HCL 1 MG/ML IJ SOLN
INTRAMUSCULAR | Status: AC
  Filled 2015-05-30: qty 1

## 2015-05-30 MED ORDER — GENTAMICIN SULFATE 40 MG/ML IJ SOLN
INTRAMUSCULAR | Status: DC
  Filled 2015-05-30: qty 1000

## 2015-05-30 MED ORDER — PROPOFOL 10 MG/ML IV BOLUS
INTRAVENOUS | Status: AC
  Filled 2015-05-30: qty 20

## 2015-05-30 MED ORDER — 0.9 % SODIUM CHLORIDE (POUR BTL) OPTIME
TOPICAL | Status: DC | PRN
  Administered 2015-05-30 (×2): 1000 mL

## 2015-05-30 MED ORDER — ALBUTEROL SULFATE (2.5 MG/3ML) 0.083% IN NEBU: 2.5000 mg | INHALATION_SOLUTION | Freq: Four times a day (QID) | RESPIRATORY_TRACT | Status: DC | PRN

## 2015-05-30 MED ORDER — CAFFEINE-MAGNESIUM SALICYLATE 50-162.5 MG PO TABS: ORAL_TABLET | Freq: Every day | ORAL | Status: DC

## 2015-05-30 MED ORDER — FENTANYL CITRATE (PF) 100 MCG/2ML IJ SOLN
INTRAMUSCULAR | Status: DC | PRN
  Administered 2015-05-30 (×5): 50 ug via INTRAVENOUS

## 2015-05-30 MED ORDER — TRAMADOL HCL 50 MG PO TABS: 50.0000 mg | ORAL_TABLET | Freq: Four times a day (QID) | ORAL | Status: DC | PRN

## 2015-05-30 MED ORDER — BLISTEX MEDICATED EX OINT
TOPICAL_OINTMENT | Freq: Two times a day (BID) | CUTANEOUS | Status: DC
  Administered 2015-05-30 – 2015-06-01 (×3): via TOPICAL
  Filled 2015-05-30 (×2): qty 10

## 2015-05-30 MED ORDER — BISACODYL 10 MG RE SUPP
10.0000 mg | Freq: Two times a day (BID) | RECTAL | Status: DC | PRN
  Administered 2015-05-31: 10 mg via RECTAL
  Filled 2015-05-30: qty 1

## 2015-05-30 SURGICAL SUPPLY — 57 items
APL SKNCLS STERI-STRIP NONHPOA (GAUZE/BANDAGES/DRESSINGS) ×1
BENZOIN TINCTURE PRP APPL 2/3 (GAUZE/BANDAGES/DRESSINGS) ×2 IMPLANT
BINDER ABD UNIV 12 45-62 (WOUND CARE) IMPLANT
BINDER ABDOMINAL 46IN 62IN (WOUND CARE) ×3
BLADE SURG ROTATE 9660 (MISCELLANEOUS) ×2 IMPLANT
CHLORAPREP W/TINT 26ML (MISCELLANEOUS) ×3 IMPLANT
CLOSURE WOUND 1/2 X4 (GAUZE/BANDAGES/DRESSINGS) ×2
COVER SURGICAL LIGHT HANDLE (MISCELLANEOUS) ×3 IMPLANT
DEVICE SECURE STRAP 25 ABSORB (INSTRUMENTS) ×4 IMPLANT
DEVICE TROCAR PUNCTURE CLOSURE (ENDOMECHANICALS) ×3 IMPLANT
DRAPE WARM FLUID 44X44 (DRAPE) ×3 IMPLANT
DRSG TEGADERM 2-3/8X2-3/4 SM (GAUZE/BANDAGES/DRESSINGS) ×6 IMPLANT
DRSG TEGADERM 4X4.75 (GAUZE/BANDAGES/DRESSINGS) ×2 IMPLANT
ELECT REM PT RETURN 9FT ADLT (ELECTROSURGICAL) ×3
ELECTRODE REM PT RTRN 9FT ADLT (ELECTROSURGICAL) ×1 IMPLANT
GAUZE SPONGE 2X2 8PLY STRL LF (GAUZE/BANDAGES/DRESSINGS) ×1 IMPLANT
GLOVE BIO SURGEON STRL SZ8 (GLOVE) ×4 IMPLANT
GLOVE BIOGEL PI IND STRL 7.0 (GLOVE) IMPLANT
GLOVE BIOGEL PI IND STRL 7.5 (GLOVE) IMPLANT
GLOVE BIOGEL PI IND STRL 8 (GLOVE) ×1 IMPLANT
GLOVE BIOGEL PI IND STRL 8.5 (GLOVE) IMPLANT
GLOVE BIOGEL PI INDICATOR 7.0 (GLOVE) ×4
GLOVE BIOGEL PI INDICATOR 7.5 (GLOVE) ×2
GLOVE BIOGEL PI INDICATOR 8 (GLOVE) ×2
GLOVE BIOGEL PI INDICATOR 8.5 (GLOVE) ×4
GLOVE ECLIPSE 7.5 STRL STRAW (GLOVE) ×2 IMPLANT
GLOVE ECLIPSE 8.0 STRL XLNG CF (GLOVE) ×3 IMPLANT
GLOVE SURG SS PI 6.5 STRL IVOR (GLOVE) ×4 IMPLANT
GOWN STRL REUS W/ TWL LRG LVL3 (GOWN DISPOSABLE) ×2 IMPLANT
GOWN STRL REUS W/ TWL XL LVL3 (GOWN DISPOSABLE) ×1 IMPLANT
GOWN STRL REUS W/TWL LRG LVL3 (GOWN DISPOSABLE) ×9
GOWN STRL REUS W/TWL XL LVL3 (GOWN DISPOSABLE) ×9
KIT BASIN OR (CUSTOM PROCEDURE TRAY) ×3 IMPLANT
KIT ROOM TURNOVER OR (KITS) ×3 IMPLANT
MARKER SKIN DUAL TIP RULER LAB (MISCELLANEOUS) ×3 IMPLANT
MESH VENTRALIGHT ST 7X9N (Mesh General) ×2 IMPLANT
NDL SPNL 22GX3.5 QUINCKE BK (NEEDLE) ×1 IMPLANT
NEEDLE 22X1 1/2 (OR ONLY) (NEEDLE) ×3 IMPLANT
NEEDLE SPNL 22GX3.5 QUINCKE BK (NEEDLE) ×3 IMPLANT
NS IRRIG 1000ML POUR BTL (IV SOLUTION) ×6 IMPLANT
PAD ARMBOARD 7.5X6 YLW CONV (MISCELLANEOUS) ×6 IMPLANT
SCISSORS LAP 5X35 DISP (ENDOMECHANICALS) ×3 IMPLANT
SET IRRIG TUBING LAPAROSCOPIC (IRRIGATION / IRRIGATOR) ×3 IMPLANT
SLEEVE ENDOPATH XCEL 5M (ENDOMECHANICALS) ×6 IMPLANT
SPONGE GAUZE 2X2 STER 10/PKG (GAUZE/BANDAGES/DRESSINGS) ×2
STRIP CLOSURE SKIN 1/2X4 (GAUZE/BANDAGES/DRESSINGS) ×3 IMPLANT
SUT MNCRL AB 4-0 PS2 18 (SUTURE) ×3 IMPLANT
SUT PDS AB 1 CT  36 (SUTURE) ×8
SUT PDS AB 1 CT 36 (SUTURE) IMPLANT
SUT PROLENE 1 CT (SUTURE) ×18 IMPLANT
SUT VICRYL 0 UR6 27IN ABS (SUTURE) IMPLANT
TACKER 5MM HERNIA 3.5CML NAB (ENDOMECHANICALS) IMPLANT
TOWEL OR 17X26 10 PK STRL BLUE (TOWEL DISPOSABLE) ×3 IMPLANT
TRAY LAPAROSCOPIC MC (CUSTOM PROCEDURE TRAY) ×3 IMPLANT
TROCAR XCEL NON-BLD 11X100MML (ENDOMECHANICALS) ×3 IMPLANT
TROCAR XCEL NON-BLD 5MMX100MML (ENDOMECHANICALS) ×3 IMPLANT
TUBING INSUFFLATION (TUBING) ×3 IMPLANT

## 2015-05-30 NOTE — Progress Notes (Signed)
Orthopedic Tech Progress Note Patient Details:  Caleb Bauer Feb 13, 1951 [id number]  Ortho Devices Type of Ortho Device: Abdominal binder Ortho Device/Splint Interventions: Ordered, Application   [name], [name] 05/30/2015, 3:29 PM

## 2015-05-30 NOTE — H&P (Signed)
Caleb Bauer 04/23/2015 8:41 AM Location: [facility] Patient #: [id number] DOB: 1951/01/03 Single / Language: [name] / Race: Black or African American Male   History of Present Illness  Patient words: hernia.  The patient is a 65 year old male who presents with an umbilical hernia. Note for "Umbilical hernia": Patient sent for surgical consultation by Dr. Luciana Axe with [facility] physicians. Concern for symptomatic periumbilical ventral wall hernia.  Pleasant male. Comes today with related friend. Has a history of hernias around his belly button. Recalls requiring at least 2 operations in the past. He is to live in [place] but lives in [place] now. Fusion about one the surgeries were. He thinks the first one was in the 60 nineties. He then send the next surgery was a couple years later. But his friend wondered if that happened just a few years ago. No records in the region by [facility] everywhere for the past 10 years.  Nonetheless he's noted some bulging above his belly button that concern for recurrent hernia. Bulging above his belly button a Little but for his prior hernia was. This got larger and more sensitive over the past month or so. Have cardiomyopathy is anticoagulated on Eliquis. Followed by cardiology. Echocardiogram January 2016 shows ejection fraction 15-20 percent. Patient claims he can walk on a for a half hour without difficulty. He no longer smokes. Some occasional constipation. Takes intermittent laxatives. Recalls getting colonoscopies in the past. Last one had a few polyps removed.  He notes that hernias really bothering him is wondering if the mesh had expired. He is concerned that he can get and more trouble in which to have it repaired. Primary care office agreed and requested surgical evaluation.   Other Problems Davy Pique Bynum, CMA; 04/23/2015 8:41 AM) Congestive Heart Failure Diabetes Mellitus High blood pressure  Past  Surgical History Marjean Donna, CMA; 04/23/2015 8:41 AM) Colon Polyp Removal - Colonoscopy  Allergies (Sonya Bynum, CMA; 04/23/2015 8:42 AM) No Known Drug Allergies02/20/2017  Medication History Davy Pique Bynum, CMA; 04/23/2015 8:44 AM) Demadex (20MG  Tablet, Oral) Active. Eliquis (2.5MG  Tablet, Oral) Active. Allopurinol (100MG  Tablet, Oral) Active. Coreg (3.125MG  Tablet, Oral) Active. Amiodarone HCl (200MG  Tablet, Oral) Active. Colchicine (0.6MG  Capsule, Oral) Active. Digoxin (250MCG Tablet, Oral) Active. HydrALAZINE HCl (100MG  Tablet, Oral) Active. TraMADol HCl (50MG  Tablet, Oral) Active. Lisinopril (2.5MG  Tablet, Oral) Active. Medications Reconciled  Social History Marjean Donna, CMA; 04/23/2015 8:41 AM) Alcohol use Remotely quit alcohol use. Caffeine use Coffee, Tea. Tobacco use Former smoker.  Family History Marjean Donna, [place]; 04/23/2015 8:41 AM) Arthritis Mother. Diabetes Mellitus Brother.    Review of Systems Davy Pique Bynum CMA; 04/23/2015 8:41 AM) General Not Present- Appetite Loss, Chills, Fatigue, Fever, Night Sweats, Weight Gain and Weight Loss. Skin Not Present- Change in Wart/Mole, Dryness, Hives, Jaundice, New Lesions, Non-Healing Wounds, Rash and Ulcer. HEENT Not Present- Earache, Hearing Loss, Hoarseness, Nose Bleed, Oral Ulcers, Ringing in the Ears, Seasonal Allergies, Sinus Pain, Sore Throat, Visual Disturbances, Wears glasses/contact lenses and Yellow Eyes. Respiratory Not Present- Bloody sputum, Chronic Cough, Difficulty Breathing, Snoring and Wheezing. Breast Not Present- Breast Mass, Breast Pain, Nipple Discharge and Skin Changes. Cardiovascular Not Present- Chest Pain, Difficulty Breathing Lying Down, Leg Cramps, Palpitations, Rapid Heart Rate, Shortness of Breath and Swelling of Extremities. Gastrointestinal Not Present- Abdominal Pain, Bloating, Bloody Stool, Change in Bowel Habits, Chronic diarrhea, Constipation, Difficulty Swallowing, Excessive gas,  Gets full quickly at meals, Hemorrhoids, Indigestion, Nausea, Rectal Pain and Vomiting. Male Genitourinary Present- Impotence and Nocturia. Not  Present- Blood in Urine, Change in Urinary Stream, Frequency, Painful Urination, Urgency and Urine Leakage.  Vitals (Sonya Bynum CMA; 04/23/2015 8:42 AM) 04/23/2015 8:41 AM Weight: 264 lb Height: 72in Body Surface Area: 2.4 m Body Mass Index: 35.8 kg/m  Temp.: 86F(Temporal)  Pulse: 77 (Regular)  BP: 130/80 (Sitting, Left Arm, Standard)       Physical Exam [name] MD; 04/23/2015 9:17 AM) General Mental Status-Alert. General Appearance-Not in acute distress, Not Sickly. Orientation-Oriented X3. Hydration-Well hydrated. Voice-Normal. Note: Pleasant, chatty   Integumentary Global Assessment Upon inspection and palpation of skin surfaces of the - Axillae: non-tender, no inflammation or ulceration, no drainage. and Distribution of scalp and body hair is normal. General Characteristics Temperature - normal warmth is noted. Note: Blue press on fingernails on both hands   Head and Neck Head-normocephalic, atraumatic with no lesions or palpable masses. Face Global Assessment - atraumatic, no absence of expression. Neck Global Assessment - no abnormal movements, no bruit auscultated on the right, no bruit auscultated on the left, no decreased range of motion, non-tender. Trachea-midline. Thyroid Gland Characteristics - non-tender.  Eye Eyeball - Left-Extraocular movements intact, No Nystagmus. Eyeball - Right-Extraocular movements intact, No Nystagmus. Cornea - Left-No Hazy. Cornea - Right-No Hazy. Sclera/Conjunctiva - Left-No scleral icterus, No Discharge. Sclera/Conjunctiva - Right-No scleral icterus, No Discharge. Pupil - Left-Direct reaction to light normal. Pupil - Right-Direct reaction to light normal.  ENMT Ears Pinna - Left - no drainage observed, no generalized  tenderness observed. Right - no drainage observed, no generalized tenderness observed. Nose and Sinuses External Inspection of the Nose - no destructive lesion observed. Inspection of the nares - Left - quiet respiration. Right - quiet respiration. Mouth and Throat Lips - Upper Lip - no fissures observed, no pallor noted. Lower Lip - no fissures observed, no pallor noted. Nasopharynx - no discharge present. Oral Cavity/Oropharynx - Tongue - no dryness observed. Oral Mucosa - no cyanosis observed. Hypopharynx - no evidence of airway distress observed. Note: Many piercings. Good hygiene.   Chest and Lung Exam Inspection Movements - Normal and Symmetrical. Accessory muscles - No use of accessory muscles in breathing. Palpation Palpation of the chest reveals - Non-tender. Auscultation Breath sounds - Normal and Clear.  Cardiovascular Auscultation Rhythm - Regular. Murmurs & Other Heart Sounds - Auscultation of the heart reveals - No Murmurs and No Systolic Clicks.  Abdomen Inspection Inspection of the abdomen reveals - No Visible peristalsis and No Abnormal pulsations. Umbilicus - No Bleeding, No Urine drainage. Palpation/Percussion Palpation and Percussion of the abdomen reveal - Soft, Non Tender, No Rebound tenderness, No Rigidity (guarding) and No Cutaneous hyperesthesia. Note: Moderate diastases recti. 7 cm mass supraumbilical reducible. Consistent with hernia. Transverse umbilical incision intact. No umbilical hernia.   Male Genitourinary Sexual Maturity Tanner 5 - Adult hair pattern and Adult penile size and shape. Note: Normal external male genitalia. No inguinal hernias.   Peripheral Vascular Upper Extremity Inspection - Left - No Cyanotic nailbeds, Not Ischemic. Right - No Cyanotic nailbeds, Not Ischemic.  Neurologic Neurologic evaluation reveals -normal attention span and ability to concentrate, able to name objects and repeat phrases. Appropriate fund of knowledge ,  normal sensation and normal coordination. Mental Status Affect - not angry, not paranoid. Cranial Nerves-Normal Bilaterally. Gait-Normal.  Neuropsychiatric Mental status exam performed with findings of-able to articulate well with normal speech/language, rate, volume and coherence, thought content normal with ability to perform basic computations and apply abstract reasoning and no evidence of hallucinations, delusions, obsessions  or homicidal/suicidal ideation.  Musculoskeletal Global Assessment Spine, Ribs and Pelvis - no instability, subluxation or laxity. Right Upper Extremity - no instability, subluxation or laxity.  Lymphatic Head & Neck  General Head & Neck Lymphatics: Bilateral - Description - No Localized lymphadenopathy. Axillary  General Axillary Region: Bilateral - Description - No Localized lymphadenopathy. Femoral & Inguinal  Generalized Femoral & Inguinal Lymphatics: Left - Description - No Localized lymphadenopathy. Right - Description - No Localized lymphadenopathy.    Assessment & Plan  VENTRAL HERNIA WITHOUT OBSTRUCTION OR GANGRENE (K43.9)  Impression: Obvious bulge in supraumbilical region. Suspect he has either recurrence on the upper edge of his prior mesh repair vs a new hernia. Also has diastases recti.  I would do a laparoscopic underlay repair with primary repair to minimize recurrence given his obesity.  Would definitely like cardiac clearance to make sure it safe to come off his Eliquis. Cardiologist Dr. Einar Gip. His operative risks are higher than average if his ejection fraction really is down in the twenties. However since he no longer smokes and is exercising more, perhaps improved. He definitely wanted make sure that risks are low before considering elective repair.  ADDENDUM: Cleared by cardiology  [place] [facility] [place] [id number]) Current Plans You are being scheduled for surgery - Our schedulers will call you.  You  should hear from our office's scheduling department within 5 working days about the location, date, and time of surgery. We try to make accommodations for patient's preferences in scheduling surgery, but sometimes the OR schedule or the surgeon's schedule prevents [place] from making those accommodations.  If you have not heard from our office [phone]) in 5 working days, call the office and ask for your surgeon's nurse.  If you have other questions about your diagnosis, plan, or surgery, call the office and ask for your surgeon's nurse.  Written instructions provided The anatomy & physiology of the abdominal wall was discussed. The pathophysiology of hernias was discussed. Natural history risks without surgery including progeressive enlargement, pain, incarceration, & strangulation was discussed. Contributors to complications such as smoking, obesity, diabetes, prior surgery, etc were discussed.  I feel the risks of no intervention will lead to serious problems that outweigh the operative risks; therefore, I recommended surgery to reduce and repair the hernia. I explained laparoscopic techniques with possible need for an open approach. I noted the probable use of mesh to patch and/or buttress the hernia repair  Risks such as bleeding, infection, abscess, need for further treatment, heart attack, death, and other risks were discussed. I noted a good likelihood this will help address the problem. Goals of post-operative recovery were discussed as well. Possibility that this will not correct all symptoms was explained. I stressed the importance of low-impact activity, aggressive pain control, avoiding constipation, & not pushing through pain to minimize risk of post-operative chronic pain or injury. Possibility of reherniation especially with smoking, obesity, diabetes, immunosuppression, and other health conditions was discussed. We will work to minimize complications.  An educational handout further  explaining the pathology & treatment options was given as well. Questions were answered. The patient expresses understanding & wishes to proceed with surgery.  Pt Education - Pamphlet Given - Laparoscopic Hernia Repair: discussed with patient and provided information. Pt Education - [facility] Hernia Post-Op HCI (Damarcus Reggio): discussed with patient and provided information. Pt Education - [facility] Pain Control (Crystalann Korf)  ANTICOAGULATED (Z79.01) Impression: Make sure it safe to come off his blood thinners. Current Plans I recommended obtaining preoperative  cardiac clearance. I am concerned about the health of the patient and the ability to tolerate the operation. Therefore, we will request clearance by cardiology to better assess operative risk & see if a reevaluation, further workup, etc is needed. Also recommendations on how medications such as for anticoagulation and blood pressure should be managed/held/restarted after surgery.  [name], M.D., F.A.C.S. Gastrointestinal and Minimally Invasive Surgery [facility] [address] [place], [place] [address] [phone] Main / Paging

## 2015-05-30 NOTE — Anesthesia Preprocedure Evaluation (Addendum)
Anesthesia Evaluation  Patient identified by MRN, date of birth, ID band Patient awake    Reviewed: Allergy & Precautions, NPO status , Patient's Chart, lab work & pertinent test results  Airway Mallampati: II  TM Distance: >3 FB Neck ROM: Full    Dental  (+) Dental Advisory Given   Pulmonary sleep apnea , COPD, former smoker,    Pulmonary exam normal breath sounds clear to auscultation       Cardiovascular hypertension, Pt. on medications +CHF   Rhythm:Regular Rate:Normal + Systolic murmurs Left ventricle: The cavity size was severely dilated. There was mild concentric hypertrophy. Systolic function was severely reduced. The estimated ejection fraction was in the range of 15% to 20%. Diffuse hypokinesis. There was a reduced contribution of atrial contraction to ventricular filling, due to increased ventricular diastolic pressure or atrial contractile dysfunction. Doppler parameters are consistent with a reversible restrictive pattern, indicative of decreased left ventricular diastolic compliance and/or increased left atrial pressure (grade 3 diastolic dysfunction). - Ventricular septum: These changes are consistent with a left bundle branch block. - Mitral valve: There was mild regurgitation. - Left atrium: The atrium was moderately to severely dilated. - Right ventricle: The cavity size was moderately dilated. Systolic function was moderately reduced. - Atrial septum: No defect or patent foramen ovale was identified. - Tricuspid valve: Moderately dilated annulus. - Inferior vena cava: The vessel was dilated. The respirophasic diameter changes were blunted (< 50%), consistent with elevated central venous pressure. - Pericardium, extracardiac: A small pericardial effusion was identified posterior to the heart. The fluid had no internal echoes.    Neuro/Psych negative neurological ROS  negative psych ROS   GI/Hepatic negative GI ROS, Neg liver ROS,   Endo/Other  diabetes  Renal/GU Renal InsufficiencyRenal disease  negative genitourinary   Musculoskeletal negative musculoskeletal ROS (+)   Abdominal (+) + obese,   Peds negative pediatric ROS (+)  Hematology negative hematology ROS (+)   Anesthesia Other Findings   Reproductive/Obstetrics negative OB ROS                            Anesthesia Physical Anesthesia Plan  ASA: IV  Anesthesia Plan: General   Post-op Pain Management:    Induction: Intravenous  Airway Management Planned: Oral ETT  Additional Equipment: Arterial line  Intra-op Plan:   Post-operative Plan: Extubation in OR  Informed Consent: I have reviewed the patients History and Physical, chart, labs and discussed the procedure including the risks, benefits and alternatives for the proposed anesthesia with the patient or authorized representative who has indicated his/her understanding and acceptance.   Dental advisory given  Plan Discussed with: CRNA and Surgeon  Anesthesia Plan Comments:         Anesthesia Quick Evaluation

## 2015-05-30 NOTE — Op Note (Addendum)
05/30/2015  2:57 PM  PATIENT:  Caleb Bauer  65 y.o. male  Patient Care Team: Tammy Arn Medal, MD as PCP - General (Family Medicine) Michael Boston, MD as Consulting Physician (General Surgery) Adrian Prows, MD as Consulting Physician (Cardiology)  PRE-OPERATIVE DIAGNOSIS:  periumbilical ventral wall hernia  POST-OPERATIVE DIAGNOSIS:    Supraumbilical incarcerated ventral wall hernia Partial small bowel obsctuction  PROCEDURE:  Procedure(s): LAPAROSCOPIC VENTRAL HERNIA REPAIR WITH MESH  INSERTION OF MESH LYSIS OF ADHESIONS X 90 min (1/2 case)  SURGEON:  Surgeon(s): Michael Boston, MD  ASSISTANT: RN   ANESTHESIA:     General Local anesthesia field block: (0.25% bupivacaine & liposomal  Bupivacaine [Experel])   EBL:  Total I/O In: -  Out: 69 [Blood:60]  Delay start of Pharmacological VTE agent (>24hrs) due to surgical blood loss or risk of bleeding:  no  DRAINS: none   SPECIMEN:  Source of Specimen:  Hernia sac with old GoreTex mesh  DISPOSITION OF SPECIMEN:  PATHOLOGY  COUNTS:  YES  PLAN OF CARE: Admit for overnight observation  PATIENT DISPOSITION:  PACU - hemodynamically stable.  INDICATION: Pleasant patient has developed a ventral wall abdominal hernia.   Recommendation was made for surgical repair:  The anatomy & physiology of the abdominal wall was discussed. The pathophysiology of hernias was discussed. Natural history risks without surgery including progeressive enlargement, pain, incarceration & strangulation was discussed. Contributors to complications such as smoking, obesity, diabetes, prior surgery, etc were discussed.  I feel the risks of no intervention will lead to serious problems that outweigh the operative risks; therefore, I recommended surgery to reduce and repair the hernia. I explained laparoscopic techniques with possible need for an open approach. I noted the probable use of mesh to patch and/or buttress the hernia repair  Risks such as  bleeding, infection, abscess, need for further treatment, heart attack, death, and other risks were discussed. I noted a good likelihood this will help address the problem. Goals of post-operative recovery were discussed as well. Possibility that this will not correct all symptoms was explained. I stressed the importance of low-impact activity, aggressive pain control, avoiding constipation, & not pushing through pain to minimize risk of post-operative chronic pain or injury. Possibility of reherniation especially with smoking, obesity, diabetes, immunosuppression, and other health conditions was discussed. We will work to minimize complications.  An educational handout further explaining the pathology & treatment options was given as well. Questions were answered. The patient expresses understanding & wishes to proceed with surgery.   OR FINDINGS: Dense adhesions of small bowel and some omentum to anterior abdominal wall.  Supraumbilical hernia 5 x 3 cm.  Probable recurrence at apex of old mesh.  Partially incarcerated with small bowel and omentum within it.  Old prior umbilical mesh.  Looks like Gore-Tex mesh.  No recurrent hernia infraumbilically.   Type of repair - Laparoscopic underlay repair   Name of mesh - Bard Ventralight dual sided (polypropylene / Seprafilm)  Size of mesh - Height 17 cm, Width 23 cm  Orientation:  Transverse   Mesh overlap - 5-7 cm  Placement of mesh - Intraperitoneal underlay repair   DESCRIPTION:   Informed consent was confirmed. The patient underwent general anaesthesia without difficulty. The patient was positioned appropriately. VTE prevention in place. The patient's abdomen was clipped, prepped, & draped in a sterile fashion. Surgical timeout confirmed our plan.  The patient was positioned in reverse Trendelenburg. Abdominal entry was gained using optical entry technique in the left  upper abdomen. Entry was clean. I induced carbon dioxide insufflation. Camera  inspection revealed no injury. Extra ports were carefully placed under direct laparoscopic visualization.   Patient had dense adhesions of greater omentum and especially small bowel to the anterior abdominal wall.  Especially periumbilically.  Did lyse adhesions with primarily cold scissors.  Very thick adhesions of small bowel to old mesh.  Looked like Gore-Tex mesh.  Eventually freed that down.  I could see the hernia in the supraumbilical abdomen.  I did laparoscopic lysis of adhesions to free off interloop adhesions.  Central intestine at jejunum was rather dilated and twisted upon itself.  Gradually did lyse adhesions.  Had reduced and freed hernia sac with some mesh in it.  Freed that off.  I ran the small bowel from the ileocecal valve to the proximal ligament of Treitz.  Freed off interloop adhesions and straighten the area out.  It inspection to prove no evidence of enterotomy or any other abnormality.    Hemostasis was good.  Near the irrigated the abdomen with antibiotic-soaked solution (clindamycin/gentamicin).  That was aspirated the end of the case.    I mapped out the region using a needle passer.   To ensure that I would have at least 5 cm radial coverage outside of the hernia defect, I chose a 23x17 cm dual sided mesh.  I placed #1 Prolene stitches around its edge about every 5 cm = 14 total.  I rolled the mesh & placed into the peritoneal cavity through the 10 cm fascial defect.  I unrolled  the mesh and positioned it appropriately.  I secured the mesh to cover up the hernia defect using a laparoscopic suture passer to pass the tails of the Prolene through the abdominal wall & tagged them with clamps.  I started out in four corners to make sure I had the mesh centered under the hernia defect appropriately, and then proceeded to work in quadrants.  We evacuated CO2 & desufflated the abdomen.  I tied the fascial stitches down.  I closed the hernia using #1 PDS interrupted transverse stitches  primarily.    I reinsufflated the abdomen.  The mesh provided at least 5-10 cm circumferential coverage around the entire region of hernia defects.   I centrally tacked the mesh through the hernia repair with #1 Prolene suture using laparoscopic suture passer.  I tacked the edges & central part of the mesh to the peritoneum/posterior rectus fascia with SecureStrap absorbable tacks.   A good field block of local anesthesia was used at fascial stitch sites & fascial closure areas.  Hemostasis was excellent.  Aspirated the antibiotic solution.  I did reinspection. Hemostasis was good. Mesh laid well. Capnoperitoneum was evacuated. Ports were removed. The skin was closed with Monocryl at the port sites and Steri-Strips on the fascial stitch puncture sites.  Patient is being extubated to go to the recovery room. I'm about to discuss operative findings with the patient's family.   [name], M.D., F.A.C.S. Gastrointestinal and Minimally Invasive Surgery [facility] [address] [place], [place] [address] [phone] Main / Paging

## 2015-05-30 NOTE — Transfer of Care (Signed)
Immediate Anesthesia Transfer of Care Note  Patient: Caleb Bauer  Procedure(s) Performed: Procedure(s): LAPAROSCOPIC VENTRAL HERNIA REPAIR WITH MESH  (N/A) INSERTION OF MESH (N/A) LYSIS OF ADHESION (N/A)  Patient Location: PACU  Anesthesia Type:General  Level of Consciousness: awake, alert , oriented and patient cooperative  Airway & Oxygen Therapy: Patient Spontanous Breathing and Patient connected to nasal cannula oxygen  Post-op Assessment: Report given to RN and Post -op Vital signs reviewed and stable  Post vital signs: Reviewed and stable  Last Vitals:  Filed Vitals:   05/30/15 1026  BP: 142/81  Pulse: 64  Temp: 36.6 C  Resp: 20    Complications: No apparent anesthesia complications

## 2015-05-31 ENCOUNTER — Encounter (HOSPITAL_COMMUNITY): Payer: Self-pay | Admitting: Surgery

## 2015-05-31 DIAGNOSIS — K436 Other and unspecified ventral hernia with obstruction, without gangrene: Secondary | ICD-10-CM | POA: Diagnosis not present

## 2015-05-31 LAB — MAGNESIUM: Magnesium: 1.7 mg/dL (ref 1.7–2.4)

## 2015-05-31 LAB — CBC
HEMATOCRIT: 35.2 % — AB (ref 39.0–52.0)
Hemoglobin: 10.8 g/dL — ABNORMAL LOW (ref 13.0–17.0)
MCH: 24.8 pg — ABNORMAL LOW (ref 26.0–34.0)
MCHC: 30.7 g/dL (ref 30.0–36.0)
MCV: 80.7 fL (ref 78.0–100.0)
Platelets: 96 10*3/uL — ABNORMAL LOW (ref 150–400)
RBC: 4.36 MIL/uL (ref 4.22–5.81)
RDW: 17.9 % — ABNORMAL HIGH (ref 11.5–15.5)
WBC: 4.6 10*3/uL (ref 4.0–10.5)

## 2015-05-31 LAB — GLUCOSE, CAPILLARY
GLUCOSE-CAPILLARY: 89 mg/dL (ref 65–99)
GLUCOSE-CAPILLARY: 112 mg/dL — AB (ref 65–99)
GLUCOSE-CAPILLARY: 113 mg/dL — AB (ref 65–99)
Glucose-Capillary: 99 mg/dL (ref 65–99)

## 2015-05-31 LAB — BASIC METABOLIC PANEL
Anion gap: 10 (ref 5–15)
BUN: 22 mg/dL — AB (ref 6–20)
CALCIUM: 8.9 mg/dL (ref 8.9–10.3)
CO2: 23 mmol/L (ref 22–32)
CREATININE: 1.68 mg/dL — AB (ref 0.61–1.24)
Chloride: 111 mmol/L (ref 101–111)
GFR calc Af Amer: 48 mL/min — ABNORMAL LOW (ref >60)
GFR calc non Af Amer: 41 mL/min — ABNORMAL LOW (ref >60)
GLUCOSE: 113 mg/dL — AB (ref 65–99)
Potassium: 4.8 mmol/L (ref 3.5–5.1)
SODIUM: 144 mmol/L (ref 135–145)

## 2015-05-31 MED ORDER — ACETAMINOPHEN 500 MG PO TABS
1000.0000 mg | ORAL_TABLET | Freq: Three times a day (TID) | ORAL | Status: DC
  Administered 2015-05-31: 500 mg via ORAL
  Administered 2015-05-31 – 2015-06-01 (×3): 1000 mg via ORAL
  Filled 2015-05-31 (×5): qty 2

## 2015-05-31 NOTE — Progress Notes (Signed)
Pt c/o nausea, indigestion, bloating after eating. States he feels like he has to have a BM but can't. Dulcolax pr given, along with Maalox. Dr. Johney Maine notified that patient feels he is not ready for discharge. Orders received.

## 2015-05-31 NOTE — Discharge Summary (Addendum)
Physician Discharge Summary  Patient ID: Caleb Bauer MRN: [id number] DOB/AGE: [birth year] 65 y.o.  Admit date: 05/30/2015 Discharge date: 06/01/2015  Patient Care Team: Tammy Arn Medal, MD as PCP - General (Family Medicine) Michael Boston, MD as Consulting Physician (General Surgery) Adrian Prows, MD as Consulting Physician (Cardiology)  Admission Diagnoses: Principal Problem:   Recurrent ventral hernia with incarceration s/p lap repair with mesh 05/30/2015 Active Problems:   Hypertensive heart disease   Diabetes mellitus without complication (HCC)   COPD (chronic obstructive pulmonary disease) (HCC)   GERD (gastroesophageal reflux disease)   Gout   Acute combined systolic and diastolic CHF, NYHA class 2 ([place])   Partial small bowel obstruction s/p lap adhesiolysis 05/30/2015   Sleep apnea   Incarcerated incisional hernia   S/P laparoscopic hernia repair   Discharge Diagnoses:  Principal Problem:   Recurrent ventral hernia with incarceration s/p lap repair with mesh 05/30/2015 Active Problems:   Hypertensive heart disease   Diabetes mellitus without complication (HCC)   COPD (chronic obstructive pulmonary disease) (HCC)   GERD (gastroesophageal reflux disease)   Gout   Acute combined systolic and diastolic CHF, NYHA class 2 ([place])   Partial small bowel obstruction s/p lap adhesiolysis 05/30/2015   Sleep apnea   Incarcerated incisional hernia   S/P laparoscopic hernia repair   POST-OPERATIVE DIAGNOSIS:   periumbilical ventral wall hernia, partial small bowel obsctuction  SURGERY:  05/30/2015  Procedure(s): LAPAROSCOPIC VENTRAL HERNIA REPAIR WITH MESH  INSERTION OF MESH LYSIS OF ADHESION  SURGEON:    Surgeon(s): Michael Boston, MD  Consults: None  Hospital Course:   The patient underwent the surgery above.  Postoperatively, the patient gradually mobilized and advanced to a solid diet.  Pain and other symptoms were treated aggressively.    By the time of  discharge, the patient was walking well the hallways, eating food, urinating well.  HR controlled.  BP OK.  Glucoses OK.  Pain was well-controlled on an oral medications.  Based on meeting discharge criteria and continuing to recover, I felt it was safe for the patient to be discharged from the hospital to further recover with close followup.   He agreed.  Postoperative recommendations were discussed in detail.  They are written as well.   Significant Diagnostic Studies:  Results for orders placed or performed during the hospital encounter of 05/30/15 (from the past 72 hour(s))  Glucose, capillary     Status: None   Collection Time: 05/30/15 10:29 AM  Result Value Ref Range   Glucose-Capillary 86 65 - 99 mg/dL  Glucose, capillary     Status: None   Collection Time: 05/30/15  3:03 PM  Result Value Ref Range   Glucose-Capillary 89 65 - 99 mg/dL  Glucose, capillary     Status: Abnormal   Collection Time: 05/30/15  5:20 PM  Result Value Ref Range   Glucose-Capillary 108 (H) 65 - 99 mg/dL   Comment 1 Notify RN   Glucose, capillary     Status: Abnormal   Collection Time: 05/30/15  9:50 PM  Result Value Ref Range   Glucose-Capillary 114 (H) 65 - 99 mg/dL  Basic metabolic panel     Status: Abnormal   Collection Time: 05/31/15  5:21 AM  Result Value Ref Range   Sodium 144 135 - 145 mmol/L   Potassium 4.8 3.5 - 5.1 mmol/L   Chloride 111 101 - 111 mmol/L   CO2 23 22 - 32 mmol/L   Glucose, Bld 113 (H) 65 - 99  mg/dL   BUN 22 (H) 6 - 20 mg/dL   Creatinine, Ser 1.68 (H) 0.61 - 1.24 mg/dL   Calcium 8.9 8.9 - 10.3 mg/dL   GFR calc non Af Amer 41 (L) >60 mL/min   GFR calc Af Amer 48 (L) >60 mL/min    Comment: (NOTE) The eGFR has been calculated using the CKD EPI equation. This calculation has not been validated in all clinical situations. eGFR's persistently <60 mL/min signify possible Chronic Kidney Disease.    Anion gap 10 5 - 15  CBC     Status: Abnormal   Collection Time: 05/31/15  5:21  AM  Result Value Ref Range   WBC 4.6 4.0 - 10.5 K/uL   RBC 4.36 4.22 - 5.81 MIL/uL   Hemoglobin 10.8 (L) 13.0 - 17.0 g/dL   HCT 35.2 (L) 39.0 - 52.0 %   MCV 80.7 78.0 - 100.0 fL   MCH 24.8 (L) 26.0 - 34.0 pg   MCHC 30.7 30.0 - 36.0 g/dL   RDW 17.9 (H) 11.5 - 15.5 %   Platelets 96 (L) 150 - 400 K/uL    Comment: CONSISTENT WITH PREVIOUS RESULT  Magnesium     Status: None   Collection Time: 05/31/15  5:21 AM  Result Value Ref Range   Magnesium 1.7 1.7 - 2.4 mg/dL    No results found.  Discharge Exam: Blood pressure 146/81, pulse 58, temperature 98.5 F (36.9 C), temperature source Oral, resp. rate 17, height 6' (1.829 m), weight 123.832 kg (273 lb), SpO2 99 %.  General: Pt awake/alert/oriented x4 in no major acute distress.  Calm, chatty, smiling Eyes: PERRL, normal EOM. Sclera nonicteric Neuro: CN II-XII intact w/o focal sensory/motor deficits. Lymph: No head/neck/groin lymphadenopathy Psych:  No delerium/psychosis/paranoia HENT: Normocephalic, Mucus membranes moist.  No thrush Neck: Supple, No tracheal deviation Chest: No pain.  Good respiratory excursion. CV:  Pulses intact.  Regular rhythm MS: Normal AROM mjr joints.  No obvious deformity Abdomen: Soft, Obese.  Binder in place.  Dressings dry with scant old blood.  Nondistended.  Min tender.  No incarcerated hernias. Ext:  SCDs BLE.  No significant edema.  No cyanosis Skin: No petechiae / purpura  Discharged Condition: good   Past Medical History  Diagnosis Date  . Diabetes mellitus without complication ([place])   . CHF (congestive heart failure) ([place])   . Hypertension   . Cardiomyopathy, dilated ([place])   . Small bowel obstruction ([place]) 10/2012  . COPD (chronic obstructive pulmonary disease) ([place])   . Herpes     BUTT AND BACK- 9/23 HEALED  . Atrial flutter by electrocardiogram ([place]) 11/2012  . Gout   . Chronic kidney disease   . Sleep apnea     never used cpap but has one  . GERD (gastroesophageal reflux disease)      appt 5/17  . Arthritis   . Acute renal failure ([name]) 11/23/2012  . Enteritis due to Clostridium difficile 03/12/2014  . HCAP (healthcare-associated pneumonia) 03/24/2013    HCAP 03/09/13 >tx w/ abx  cXR 03/23/13 >improved but persistent bibasilar consolidation>cxr >   . Encephalopathy acute 03/14/2013  . Herpes zoster 04/18/2013  . Gout flare 09/12/2013  . UTI (urinary tract infection) 09/15/2013    Past Surgical History  Procedure Laterality Date  . Laparoscopic incisional / umbilical / ventral hernia repair    . Hernia repair      Social History   Social History  . Marital Status: Single    Spouse Name: N/A  .  Number of Children: N/A  . Years of Education: N/A   Occupational History  . Not on file.   Social History Main Topics  . Smoking status: Former Smoker -- 1.00 packs/day for 30 years    Types: Cigarettes    Quit date: 09/28/2012  . Smokeless tobacco: Never Used  . Alcohol Use: Yes     Comment: 2015 last alcohol"  . Drug Use: No  . Sexual Activity: No   Other Topics Concern  . Not on file   Social History Narrative    Family History  Problem Relation Age of Onset  . Cancer Neg Hx     Current Facility-Administered Medications  Medication Dose Route Frequency Provider Last Rate Last Dose  . 0.9 %  sodium chloride infusion  250 mL Intravenous PRN Michael Boston, MD 10 mL/hr at 05/30/15 1805 250 mL at 05/30/15 1805  . acetaminophen (TYLENOL) tablet 1,000 mg  1,000 mg Oral TID WC & HS Michael Boston, MD      . albuterol (PROVENTIL) (2.5 MG/3ML) 0.083% nebulizer solution 2.5 mg  2.5 mg Nebulization Q6H PRN Michael Boston, MD      . allopurinol (ZYLOPRIM) tablet 100 mg  100 mg Oral Daily Michael Boston, MD      . alum & mag hydroxide-simeth (MAALOX/MYLANTA) 200-200-20 MG/5ML suspension 30 mL  30 mL Oral Q6H PRN Michael Boston, MD      . amiodarone (PACERONE) tablet 200 mg  200 mg Oral Daily Michael Boston, MD      . apixaban Arne Cleveland) tablet 5 mg  5 mg Oral BID Michael Boston,  MD      . bisacodyl (DULCOLAX) suppository 10 mg  10 mg Rectal Q12H PRN Michael Boston, MD      . carvedilol (COREG) tablet 3.125 mg  3.125 mg Oral BID Michael Boston, MD   3.125 mg at 05/30/15 2158  . colchicine tablet 0.6 mg  0.6 mg Oral BID PRN Michael Boston, MD      . digoxin [facility]) tablet 125 mcg  125 mcg Oral Carlis Abbott, MD      . diphenhydrAMINE (BENADRYL) 12.5 MG/5ML elixir 12.5 mg  12.5 mg Oral Q6H PRN Michael Boston, MD       Or  . diphenhydrAMINE (BENADRYL) injection 12.5 mg  12.5 mg Intravenous Q6H PRN Michael Boston, MD      . diphenhydrAMINE (BENADRYL) injection 12.5-25 mg  12.5-25 mg Intravenous Q6H PRN Michael Boston, MD      . hydrALAZINE (APRESOLINE) injection 10 mg  10 mg Intravenous Q2H PRN Michael Boston, MD      . HYDROmorphone (DILAUDID) injection 0.5-2 mg  0.5-2 mg Intravenous Q2H PRN Michael Boston, MD      . insulin aspart (novoLOG) injection 0-15 Units  0-15 Units Subcutaneous TID WC Michael Boston, MD   0 Units at 05/30/15 1738  . insulin aspart (novoLOG) injection 0-5 Units  0-5 Units Subcutaneous QHS Michael Boston, MD   0 Units at 05/30/15 2200  . isosorbide dinitrate (ISORDIL) tablet 10 mg  10 mg Oral TID Michael Boston, MD   10 mg at 05/30/15 2159  . lactated ringers bolus 1,000 mL  1,000 mL Intravenous Q8H PRN Michael Boston, MD      . lip balm (BLISTEX) ointment   Topical BID Michael Boston, MD      . magic mouthwash  15 mL Oral QID PRN Michael Boston, MD      . menthol-cetylpyridinium (CEPACOL) lozenge 3 mg  1 lozenge Oral  PRN Michael Boston, MD      . methocarbamol (ROBAXIN) 1,000 mg in dextrose 5 % 50 mL IVPB  1,000 mg Intravenous Q6H PRN Michael Boston, MD      . methocarbamol (ROBAXIN) tablet 500 mg  500 mg Oral Q6H PRN Michael Boston, MD      . metolazone (ZAROXOLYN) tablet 5 mg  5 mg Oral Daily Michael Boston, MD   5 mg at 05/30/15 2200  . metoprolol (LOPRESSOR) injection 5 mg  5 mg Intravenous Q6H PRN Michael Boston, MD      . ondansetron (ZOFRAN-ODT) disintegrating tablet 4 mg   4 mg Oral Q6H PRN Michael Boston, MD   4 mg at 05/30/15 1839   Or  . ondansetron [facility]) injection 4 mg  4 mg Intravenous Q6H PRN Michael Boston, MD      . phenol (CHLORASEPTIC) mouth spray 2 spray  2 spray Mouth/Throat PRN Michael Boston, MD      . sodium chloride flush (NS) 0.9 % injection 3 mL  3 mL Intravenous Q12H Michael Boston, MD   3 mL at 05/30/15 2200  . sodium chloride flush (NS) 0.9 % injection 3 mL  3 mL Intravenous PRN Michael Boston, MD      . torsemide [facility]) tablet 40 mg  40 mg Oral Once per day on Mon Thu Stacie Templin, MD      . traMADol Veatrice Bourbon) tablet 50-100 mg  50-100 mg Oral Q6H PRN Michael Boston, MD       Facility-Administered Medications Ordered in Other Encounters  Medication Dose Route Frequency Provider Last Rate Last Dose  . propofol (DIPRIVAN) 10 mg/mL bolus/IV push    Anesthesia Intra-op Carter Kitten, CRNA   40 mg at 09/12/12 0000  . succinylcholine (ANECTINE) injection    Anesthesia Intra-op Carter Kitten, CRNA   120 mg at 05/30/15 1206     Allergies  Allergen Reactions  . Other Nausea And Vomiting    Patient stated drug is "kerein"? Formulation, dose, indication?    Disposition: 01-Home or Self Care  Discharge Instructions    Call MD for:  extreme fatigue    Complete by:  As directed      Call MD for:  hives    Complete by:  As directed      Call MD for:  persistant nausea and vomiting    Complete by:  As directed      Call MD for:  redness, tenderness, or signs of infection (pain, swelling, redness, odor or green/yellow discharge around incision site)    Complete by:  As directed      Call MD for:  severe uncontrolled pain    Complete by:  As directed      Call MD for:    Complete by:  As directed   Temperature > 101.23F     Diet - low sodium heart healthy    Complete by:  As directed   Start with bland, low residue diet for a few days, then advance to a heart healthy (low fat, high fiber) diet.  If you feel nauseated or constipated, simplify to a liquid  only diet for 48 hours until you are feeling better (no more nausea, farting/passing gas, having a bowel movement, etc...).  If you cannot tolerate even drinking liquids, or feeling worse, let your surgeon know or go to the Emergency Department for help.     Discharge instructions    Complete by:  As directed   Please see  discharge instruction sheets.   Also refer to any handouts/printouts that may have been given from the [facility] surgery office (if you visited [place] there before surgery) Please call our office if you have any questions or concerns [phone]     Discharge wound care:    Complete by:  As directed   If you have closed incisions: Shower and bathe over these incisions with soap and water every day.  It is OK to wash over the dressings: they are waterproof. Remove all surgical dressings on postoperative day #3.  You do not need to replace dressings over the closed incisions unless you feel more comfortable with a Band-Aid covering it.   If you have an open wound: That requires packing, so please see wound care instructions.   In general, remove all dressings, wash wound with soap and water and then replace with saline moistened gauze.  Do the dressing change at least every day.    Please call our office [phone] if you have further questions.     Driving Restrictions    Complete by:  As directed   No driving until off narcotics and can safely swerve away without pain during an emergency     Increase activity slowly    Complete by:  As directed   Walk an hour a day.  Use 20-30 minute walks.  When you can walk 30 minutes without difficulty, it is fine to restart low impact/moderate activities such as biking, jogging, swimming, sexual activity, etc.  Eventually you can increase to unrestricted activity when not feeling pain.  If you feel pain: STOP!Marland Kitchen   Let pain protect you from overdoing it.  Use ice/heat & over-the-counter pain medications to help minimize soreness.  If that is not  enough, then use your narcotic pain prescription as needed to remain active.  It is better to take extra pain medications and be more active than to stay bedridden to avoid all pain medications.     Lifting restrictions    Complete by:  As directed   Avoid heavy lifting initially, <20 pounds at first.   Do not push through pain.   You have no specific weight limit: If it hurts to do, DON'T DO IT.    If you feel no pain, you are not injuring anything.  Pain will protect you from injury.   Coughing and sneezing are far more stressful to your incision than any lifting.   Avoid resuming heavy lifting (>50 pounds) or other intense activity until off all narcotic pain medications.   When want to exercise more, give yourself 2 weeks to gradually get back to full intense exercise/activity.     May shower / Bathe    Complete by:  As directed   [place].  It is fine for dressings or wounds to be washed/rinsed.  Use gentle soap & water.  This will help the incisions and/or wounds get clean & minimize infection.     May walk up steps    Complete by:  As directed      Sexual Activity Restrictions    Complete by:  As directed   Sexual activity as tolerated.  Do not push through pain.  Pain will protect you from injury.     Walk with assistance    Complete by:  As directed   Walk over an hour a day.  May use a walker/cane/companion to help with balance and stamina.  Medication List    STOP taking these medications        aspirin EC 81 MG tablet     cephALEXin 500 MG capsule  Commonly known as:  KEFLEX     spironolactone 25 MG tablet  Commonly known as:  ALDACTONE      TAKE these medications        albuterol (2.5 MG/3ML) 0.083% nebulizer solution  Commonly known as:  PROVENTIL  Take 2.5 mg by nebulization every 6 (six) hours as needed for wheezing or shortness of breath.     allopurinol 100 MG tablet  Commonly known as:  ZYLOPRIM  TAKE ONE TABLET BY MOUTH ONCE DAILY      amiodarone 200 MG tablet  Commonly known as:  PACERONE  Take 1 tablet (200 mg total) by mouth daily.     apixaban 5 MG Tabs tablet  Commonly known as:  ELIQUIS  Take 5 mg by mouth 2 (two) times daily.     carvedilol 3.125 MG tablet  Commonly known as:  COREG  Take 1 tablet (3.125 mg total) by mouth 2 (two) times daily.     colchicine 0.6 MG tablet  Take 1 tablet (0.6 mg total) by mouth 2 (two) times daily as needed (gout flair).     digoxin 0.125 MG tablet  Commonly known as:  LANOXIN  TAKE ONE TABLET BY MOUTH EVERY OTHER DAY     DIUREX PO  Take 1 tablet by mouth daily.     isosorbide dinitrate 10 MG tablet  Commonly known as:  ISORDIL  Take 10 mg by mouth 3 (three) times daily.     metolazone 5 MG tablet  Commonly known as:  ZAROXOLYN  Take 5 mg by mouth daily.     torsemide 20 MG tablet  Commonly known as:  DEMADEX  Take 2 tablets (40 mg total) by mouth 2 (two) times a week. Every Monday and Friday, Hold for wt less than 193 lb     traMADol 50 MG tablet  Commonly known as:  ULTRAM  Take 1-2 tablets (50-100 mg total) by mouth every 6 (six) hours as needed for moderate pain or severe pain.           Follow-up Information    Schedule an appointment as soon as possible for a visit in 3 weeks to follow up.   Why:  To follow up after your operation, To follow up after your hospital stay       Signed: Morton Peters, M.D., F.A.C.S. Gastrointestinal and Minimally Invasive Surgery [facility] [address], [place], [place] [address] [phone] Main / Paging   05/31/2015, 6:55 AM

## 2015-05-31 NOTE — Discharge Instructions (Signed)
HERNIA REPAIR: POST OP INSTRUCTIONS ° °1. DIET: Follow a light bland diet the first 24 hours after arrival home, such as soup, liquids, crackers, etc.  Be sure to include lots of fluids daily.  Avoid fast food or heavy meals as your are more likely to get nauseated.  Eat a low fat the next few days after surgery. °2. Take your usually prescribed home medications unless otherwise directed. °3. PAIN CONTROL: °a. Pain is best controlled by a usual combination of three different methods TOGETHER: °i. Ice/Heat °ii. Over the counter pain medication °iii. Prescription pain medication °b. Most patients will experience some swelling and bruising around the hernia(s) such as the bellybutton, groins, or old incisions.  Ice packs or heating pads (30-60 minutes up to 6 times a day) will help. Use ice for the first few days to help decrease swelling and bruising, then switch to heat to help relax tight/sore spots and speed recovery.  Some people prefer to use ice alone, heat alone, alternating between ice & heat.  Experiment to what works for you.  Swelling and bruising can take several weeks to resolve.   °c. It is helpful to take an over-the-counter pain medication regularly for the first few weeks.  Choose one of the following that works best for you: °i. Naproxen (Aleve, etc)  Two 220mg tabs twice a day °ii. Ibuprofen (Advil, etc) Three 200mg tabs four times a day (every meal & bedtime) °iii. Acetaminophen (Tylenol, etc) 325-650mg four times a day (every meal & bedtime) °d. A  prescription for pain medication should be given to you upon discharge.  Take your pain medication as prescribed.  °i. If you are having problems/concerns with the prescription medicine (does not control pain, nausea, vomiting, rash, itching, etc), please call us [phone] to see if we need to switch you to a different pain medicine that will work better for you and/or control your side effect better. °ii. If you need a refill on your pain  medication, please contact your pharmacy.  They will contact our office to request authorization. Prescriptions will not be filled after 5 pm or on week-ends. °4. Avoid getting constipated.  Between the surgery and the pain medications, it is common to experience some constipation.  Increasing fluid intake and taking a fiber supplement (such as Metamucil, Citrucel, FiberCon, MiraLax, etc) 1-2 times a day regularly will usually help prevent this problem from occurring.  A mild laxative (prune juice, Milk of Magnesia, MiraLax, etc) should be taken according to package directions if there are no bowel movements after 48 hours.   °5. Wash / shower every day.  You may shower over the dressings as they are waterproof.   °6. Remove your waterproof bandages 5 days after surgery.  You may leave the incision open to air.  You may replace a dressing/Band-Aid to cover the incision for comfort if you wish.  Continue to shower over incision(s) after the dressing is off. ° ° ° °7. ACTIVITIES as tolerated:   °a. You may resume regular (light) daily activities beginning the next day--such as daily self-care, walking, climbing stairs--gradually increasing activities as tolerated.  If you can walk 30 minutes without difficulty, it is safe to try more intense activity such as jogging, treadmill, bicycling, low-impact aerobics, swimming, etc. °b. Save the most intensive and strenuous activity for last such as sit-ups, heavy lifting, contact sports, etc  Refrain from any heavy lifting or straining until you are off narcotics for pain control.   °  c. DO NOT PUSH THROUGH PAIN.  Let pain be your guide: If it hurts to do something, don't do it.  Pain is your body warning you to avoid that activity for another week until the pain goes down. d. You may drive when you are no longer taking prescription pain medication, you can comfortably wear a seatbelt, and you can safely maneuver your car and apply brakes. e. Dennis Bast may have sexual intercourse  when it is comfortable.  8. FOLLOW UP in our office a. Please call [facility] at [phone] to set up an appointment to see your surgeon in the office for a follow-up appointment approximately 2-3 weeks after your surgery. b. Make sure that you call for this appointment the day you arrive home to insure a convenient appointment time. 9.  IF YOU HAVE DISABILITY OR FAMILY LEAVE FORMS, BRING THEM TO THE OFFICE FOR PROCESSING.  DO NOT GIVE THEM TO YOUR DOCTOR.  WHEN TO CALL us [phone]: 1. Poor pain control 2. Reactions / problems with new medications (rash/itching, nausea, etc)  3. Fever over 101.5 F (38.5 C) 4. Inability to urinate 5. Nausea and/or vomiting 6. Worsening swelling or bruising 7. Continued bleeding from incision. 8. Increased pain, redness, or drainage from the incision   The clinic staff is available to answer your questions during regular business hours (8:30am-5pm).  Please dont hesitate to call and ask to speak to one of our nurses for clinical concerns.   If you have a medical emergency, go to the nearest emergency room or call 911.  A surgeon from [facility] is always on call at the hospitals in [facility], [place], [place], [place], [place]  [address] ?  [address] [address], [place], [place]   [address] MAIN: [phone] ? TOLL FREE: [phone] ? FAX: [phone] [email or web address]  Hernia, Adult A hernia is the bulging of an organ or tissue through a weak spot in the muscles of the abdomen (abdominal wall). Hernias develop most often near the navel or groin. There are many kinds of hernias. Common kinds include:  Femoral hernia. This kind of hernia develops under the groin in the upper thigh area.  Inguinal hernia. This kind of hernia develops in the groin or scrotum.  Umbilical hernia. This kind of hernia develops near the navel.  Hiatal hernia. This kind of hernia causes part of the  stomach to be pushed up into the chest.  Incisional hernia. This kind of hernia bulges through a scar from an abdominal surgery. CAUSES This condition may be caused by:  Heavy lifting.  Coughing over a long period of time.  Straining to have a bowel movement.  An incision made during an abdominal surgery.  A birth defect (congenital defect).  Excess weight or obesity.  Smoking.  Poor nutrition.  Cystic fibrosis.  Excess fluid in the abdomen.  Undescended testicles. SYMPTOMS Symptoms of a hernia include:  A lump on the abdomen. This is the first sign of a hernia. The lump may become more obvious with standing, straining, or coughing. It may get bigger over time if it is not treated or if the condition causing it is not treated.  Pain. A hernia is usually painless, but it may become painful over time if treatment is delayed. The pain is usually dull and may get worse with standing or lifting heavy objects. Sometimes a hernia gets tightly squeezed in the weak spot (strangulated) or stuck there (incarcerated) and  causes additional symptoms. These symptoms may include:  Vomiting.  Nausea.  Constipation.  Irritability. DIAGNOSIS A hernia may be diagnosed with:  A physical exam. During the exam your health care provider may ask you to cough or to make a specific movement, because a hernia is usually more visible when you move.  Imaging tests. These can include:  X-rays.  Ultrasound.  CT scan. TREATMENT A hernia that is small and painless may not need to be treated. A hernia that is large or painful may be treated with surgery. Inguinal hernias may be treated with surgery to prevent incarceration or strangulation. Strangulated hernias are always treated with surgery, because lack of blood to the trapped organ or tissue can cause it to die. Surgery to treat a hernia involves pushing the bulge back into place and repairing the weak part of the abdomen. HOME CARE  INSTRUCTIONS  Avoid straining.  Do not lift anything heavier than 10 lb (4.5 kg).  Lift with your leg muscles, not your back muscles. This helps avoid strain.  When coughing, try to cough gently.  Prevent constipation. Constipation leads to straining with bowel movements, which can make a hernia worse or cause a hernia repair to break down. You can prevent constipation by:  Eating a high-fiber diet that includes plenty of fruits and vegetables.  Drinking enough fluids to keep your urine clear or pale yellow. Aim to drink 6-8 glasses of water per day.  Using a stool softener as directed by your health care provider.  Lose weight, if you are overweight.  Do not use any tobacco products, including cigarettes, chewing tobacco, or electronic cigarettes. If you need help quitting, ask your health care provider.  Keep all follow-up visits as directed by your health care provider. This is important. Your health care provider may need to monitor your condition. SEEK MEDICAL CARE IF:  You have swelling, redness, and pain in the affected area.  Your bowel habits change. SEEK IMMEDIATE MEDICAL CARE IF:  You have a fever.  You have abdominal pain that is getting worse.  You feel nauseous or you vomit.  You cannot push the hernia back in place by gently pressing on it while you are lying down.  The hernia:  Changes in shape or size.  Is stuck outside the abdomen.  Becomes discolored.  Feels hard or tender.   This information is not intended to replace advice given to you by your health care provider. Make sure you discuss any questions you have with your health care provider.   Document Released: 02/17/2005 Document Revised: 03/10/2014 Document Reviewed: 12/28/2013 Elsevier Interactive Patient Education 2016 [place].   Managing Pain  Pain after surgery or related to activity is often due to strain/injury to muscle, tendon, nerves and/or incisions.  This pain is usually  short-term and will improve in a few months.   Many people find it helpful to do the following things TOGETHER to help speed the process of healing and to get back to regular activity more quickly:  1. Avoid heavy physical activity at first a. No lifting greater than 20 pounds at first, then increase to lifting as tolerated over the next few weeks b. Do not push through the pain.  Listen to your body and avoid positions and maneuvers than reproduce the pain.  Wait a few days before trying something more intense c. Walking is okay as tolerated, but go slowly and stop when getting sore.  If you can walk 30 minutes without  stopping or pain, you can try more intense activity (running, jogging, aerobics, cycling, swimming, treadmill, sex, sports, weightlifting, etc ) d. Remember: If it hurts to do it, then dont do it!  2. Take Acetaminophen Anti-inflammatory medication i. Acetaminophen 500mg  tabs (Tylenol) 1-2 pills with every meal and just before bedtime (avoid if you have liver problems) ii. Take with food/snack around the clock for 1-2 weeks iii. This helps the muscle and nerve tissues become less irritable and calm down faster  3. Use a Heating pad or Ice/Cold Pack a. 4-6 times a day b. May use warm bath/hottub  or showers  4. Try Gentle Massage and/or Stretching  a. at the area of pain many times a day b. stop if you feel pain - do not overdo it  Try these steps together to help you body heal faster and avoid making things get worse.  Doing just one of these things may not be enough.    If you are not getting better after two weeks or are noticing you are getting worse, contact our office for further advice; we may need to re-evaluate you & see what other things we can do to help.  GETTING TO GOOD BOWEL HEALTH. Irregular bowel habits such as constipation and diarrhea can lead to many problems over time.  Having one soft bowel movement a day is the most important way to prevent further  problems.  The anorectal canal is designed to handle stretching and feces to safely manage our ability to get rid of solid waste (feces, poop, stool) out of our body.  BUT, hard constipated stools can act like ripping concrete bricks and diarrhea can be a burning fire to this very sensitive area of our body, causing inflamed hemorrhoids, anal fissures, increasing risk is perirectal abscesses, abdominal pain/bloating, an making irritable bowel worse.      The goal: ONE SOFT BOWEL MOVEMENT A DAY!  To have soft, regular bowel movements:   Drink plenty of fluids, consider 4-6 tall glasses of water a day.    Take plenty of fiber.  Fiber is the undigested part of plant food that passes into the colon, acting s natures broom to encourage bowel motility and movement.  Fiber can absorb and hold large amounts of water. This results in a larger, bulkier stool, which is soft and easier to pass. Work gradually over several weeks up to 6 servings a day of fiber (25g a day even more if needed) in the form of: o Vegetables -- Root (potatoes, carrots, turnips), leafy green (lettuce, salad greens, celery, spinach), or cooked high residue (cabbage, broccoli, etc) o Fruit -- Fresh (unpeeled skin & pulp), Dried (prunes, apricots, cherries, etc ),  or stewed ( applesauce)  o Whole grain breads, pasta, etc (whole wheat)  o Bran cereals   Bulking Agents -- This type of water-retaining fiber generally is easily obtained each day by one of the following:  o Psyllium bran -- The psyllium plant is remarkable because its ground seeds can retain so much water. This product is available as Metamucil, Konsyl, Effersyllium, Per Diem Fiber, or the less expensive generic preparation in drug and health food stores. Although labeled a laxative, it really is not a laxative.  o Methylcellulose -- This is another fiber derived from wood which also retains water. It is available as Citrucel. o Polyethylene Glycol - and artificial fiber  commonly called Miralax or Glycolax.  It is helpful for people with gassy or bloated feelings with regular fiber  o Flax Seed - a less gassy fiber than psyllium  No reading or other relaxing activity while on the toilet. If bowel movements take longer than 5 minutes, you are too constipated  AVOID CONSTIPATION.  High fiber and water intake usually takes care of this.  Sometimes a laxative is needed to stimulate more frequent bowel movements, but   Laxatives are not a good long-term solution as it can wear the colon out.  They can help jump-start bowels if constipated, but should be relied on constantly without discussing with your doctor o Osmotics (Milk of Magnesia, Fleets phosphosoda, Magnesium citrate, MiraLax, GoLytely) are safer than  o Stimulants (Senokot, Castor Oil, Dulcolax, Ex Lax)    o Avoid taking laxatives for more than 7 days in a row.   IF SEVERELY CONSTIPATED, try a Bowel Retraining Program: o Do not use laxatives.  o Eat a diet high in roughage, such as bran cereals and leafy vegetables.  o Drink six (6) ounces of prune or apricot juice each morning.  o Eat two (2) large servings of stewed fruit each day.  o Take one (1) heaping tablespoon of a psyllium-based bulking agent twice a day. Use sugar-free sweetener when possible to avoid excessive calories.  o Eat a normal breakfast.  o Set aside 15 minutes after breakfast to sit on the toilet, but do not strain to have a bowel movement.  o If you do not have a bowel movement by the third day, use an enema and repeat the above steps.   Controlling diarrhea o Switch to liquids and simpler foods for a few days to avoid stressing your intestines further. o Avoid dairy products (especially milk & ice cream) for a short time.  The intestines often can lose the ability to digest lactose when stressed. o Avoid foods that cause gassiness or bloating.  Typical foods include beans and other legumes, cabbage, broccoli, and dairy foods.   Every person has some sensitivity to other foods, so listen to our body and avoid those foods that trigger problems for you. o Adding fiber (Citrucel, Metamucil, psyllium, Miralax) gradually can help thicken stools by absorbing excess fluid and retrain the intestines to act more normally.  Slowly increase the dose over a few weeks.  Too much fiber too soon can backfire and cause cramping & bloating. o Probiotics (such as active yogurt, Align, etc) may help repopulate the intestines and colon with normal bacteria and calm down a sensitive digestive tract.  Most studies show it to be of mild help, though, and such products can be costly. o Medicines: - Bismuth subsalicylate (ex. Kayopectate, Pepto Bismol) every 30 minutes for up to 6 doses can help control diarrhea.  Avoid if pregnant. - Loperamide (Immodium) can slow down diarrhea.  Start with two tablets (4mg  total) first and then try one tablet every 6 hours.  Avoid if you are having fevers or severe pain.  If you are not better or start feeling worse, stop all medicines and call your doctor for advice o Call your doctor if you are getting worse or not better.  Sometimes further testing (cultures, endoscopy, X-ray studies, bloodwork, etc) may be needed to help diagnose and treat the cause of the diarrhea.  TROUBLESHOOTING IRREGULAR BOWELS 1) Avoid extremes of bowel movements (no bad constipation/diarrhea) 2) Miralax 17gm mixed in 8oz. water or juice-daily. May use BID as needed.  3) Gas-x,Phazyme, etc. as needed for gas & bloating.  4) Soft,bland diet. No spicy,greasy,fried foods.  5)  Prilosec over-the-counter as needed  6) May hold gluten/wheat products from diet to see if symptoms improve.  7)  May try probiotics (Align, Activa, etc) to help calm the bowels down 7) If symptoms become worse call back immediately.  Heart Failure Heart failure means your heart has trouble pumping blood. This makes it hard for your body to work well. Heart failure  is usually a long-term (chronic) condition. You must take good care of yourself and follow your doctor's treatment plan. HOME CARE  Take your heart medicine as told by your doctor.  Do not stop taking medicine unless your doctor tells you to.  Do not skip any dose of medicine.  Refill your medicines before they run out.  Take other medicines only as told by your doctor or pharmacist.  Stay active if told by your doctor. The elderly and people with severe heart failure should talk with a doctor about physical activity.  Eat heart-healthy foods. Choose foods that are without trans fat and are low in saturated fat, cholesterol, and salt (sodium). This includes fresh or frozen fruits and vegetables, fish, lean meats, fat-free or low-fat dairy foods, whole grains, and high-fiber foods. Lentils and dried peas and beans (legumes) are also good choices.  Limit salt if told by your doctor.  Cook in a healthy way. Roast, grill, broil, bake, poach, steam, or stir-fry foods.  Limit fluids as told by your doctor.  Weigh yourself every morning. Do this after you pee (urinate) and before you eat breakfast. Write down your weight to give to your doctor.  Take your blood pressure and write it down if your doctor tells you to.  Ask your doctor how to check your pulse. Check your pulse as told.  Lose weight if told by your doctor.  Stop smoking or chewing tobacco. Do not use gum or patches that help you quit without your doctor's approval.  Schedule and go to doctor visits as told.  Nonpregnant women should have no more than 1 drink a day. Men should have no more than 2 drinks a day. Talk to your doctor about drinking alcohol.  Stop illegal drug use.  Stay current with shots (immunizations).  Manage your health conditions as told by your doctor.  Learn to manage your stress.  Rest when you are tired.  If it is really hot outside:  Avoid intense activities.  Use air conditioning or  fans, or get in a cooler place.  Avoid caffeine and alcohol.  Wear loose-fitting, lightweight, and light-colored clothing.  If it is really cold outside:  Avoid intense activities.  Layer your clothing.  Wear mittens or gloves, a hat, and a scarf when going outside.  Avoid alcohol.  Learn about heart failure and get support as needed.  Get help to maintain or improve your quality of life and your ability to care for yourself as needed. GET HELP IF:   You gain weight quickly.  You are more short of breath than usual.  You cannot do your normal activities.  You tire easily.  You cough more than normal, especially with activity.  You have any or more puffiness (swelling) in areas such as your hands, feet, ankles, or belly (abdomen).  You cannot sleep because it is hard to breathe.  You feel like your heart is beating fast (palpitations).  You get dizzy or light-headed when you stand up. GET HELP RIGHT AWAY IF:   You have trouble breathing.  There is a change in mental status,  such as becoming less alert or not being able to focus.  You have chest pain or discomfort.  You faint. MAKE SURE YOU:   Understand these instructions.  Will watch your condition.  Will get help right away if you are not doing well or get worse.   This information is not intended to replace advice given to you by your health care provider. Make sure you discuss any questions you have with your health care provider.   Document Released: 11/27/2007 Document Revised: 03/10/2014 Document Reviewed: 04/05/2012 Elsevier Interactive Patient Education [employer].

## 2015-06-01 DIAGNOSIS — K436 Other and unspecified ventral hernia with obstruction, without gangrene: Secondary | ICD-10-CM | POA: Diagnosis not present

## 2015-06-01 LAB — GLUCOSE, CAPILLARY: GLUCOSE-CAPILLARY: 113 mg/dL — AB (ref 65–99)

## 2015-06-01 MED ORDER — BISACODYL 10 MG RE SUPP
10.0000 mg | Freq: Every day | RECTAL | Status: DC
  Administered 2015-06-01: 10 mg via RECTAL
  Filled 2015-06-01: qty 1

## 2015-06-01 MED ORDER — POLYETHYLENE GLYCOL 3350 17 G PO PACK
17.0000 g | PACK | Freq: Two times a day (BID) | ORAL | Status: DC
  Administered 2015-06-01: 17 g via ORAL
  Filled 2015-06-01: qty 1

## 2015-06-01 NOTE — Progress Notes (Signed)
[facility]  [address] [place], [place] [address] Phone: [phone] FAX: [phone]   Caleb Bauer [id number] 11-17-1950   Assessment  Problem List:   Principal Problem:   Recurrent ventral hernia with incarceration s/p lap repair with mesh 05/30/2015 Active Problems:   Hypertensive heart disease   Diabetes mellitus without complication (HCC)   COPD (chronic obstructive pulmonary disease) (HCC)   GERD (gastroesophageal reflux disease)   Gout   Acute combined systolic and diastolic CHF, NYHA class 2 ([place])   Partial small bowel obstruction s/p lap adhesiolysis 05/30/2015   Sleep apnea   Incarcerated incisional hernia   S/P laparoscopic hernia repair   2 Days Post-Op  05/30/2015  Procedure(s): LAPAROSCOPIC VENTRAL HERNIA REPAIR WITH MESH  INSERTION OF MESH LYSIS OF ADHESION    Improving  Plan:  -adv diet -pain control -HTN controlled -min IVF and allow diuresis for chronic CHF -VTE prophylaxis- SCDs, etc -mobilize as tolerated to help recovery  D/C patient from hospital when patient meets criteria (anticipate later today):  Tolerating oral intake well Ambulating well Adequate pain control without IV medications Urinating  Having flatus Disposition planning in place   Caleb Bauer, M.D., F.A.C.S. Gastrointestinal and Minimally Invasive Surgery [facility] [address], [place], [place] [address] [phone] Main / Paging   06/01/2015  Subjective:  Walking well HAd nausea- better now Hungry  Objective:  Vital signs:  Filed Vitals:   05/31/15 0502 05/31/15 1504 05/31/15 2202 06/01/15 0535  BP: 146/81 129/64 144/75 138/80  Pulse: 58 60 66 65  Temp: 98.5 F (36.9 C) 97.8 F (36.6 C) 98.1 F (36.7 C) 98.1 F (36.7 C)  TempSrc: Oral Oral Oral Oral  Resp: 17 17 17 18   Height:      Weight:      SpO2: 99% 93% 100% 92%    Last BM Date:  05/30/15  Intake/Output   Yesterday:  03/30 0701 - 03/31 0700 In: 240 [P.O.:240] Out: -  This shift:  Total I/O In: 240 [P.O.:240] Out: -   Bowel function:  Flatus: n  BM: n  Drain: n/a  Physical Exam:  General: Pt awake/alert/oriented x4 in no acute distress Eyes: PERRL, normal EOM.  Sclera clear.  No icterus Neuro: CN II-XII intact w/o focal sensory/motor deficits. Lymph: No head/neck/groin lymphadenopathy Psych:  No delerium/psychosis/paranoia HENT: Normocephalic, Mucus membranes moist.  No thrush Neck: Supple, No tracheal deviation Chest: No chest wall pain w good excursion CV:  Pulses intact.  Regular rhythm MS: Normal AROM mjr joints.  No obvious deformity Abdomen: Soft.  Mildly distended.  Morbidly obese.  Mildly tender at incisions only.  No evidence of peritonitis.  No incarcerated hernias. Ext:  SCDs BLE.  No mjr edema.  No cyanosis Skin: No petechiae / purpura  Results:   Labs: Results for orders placed or performed during the hospital encounter of 05/30/15 (from the past 48 hour(s))  Glucose, capillary     Status: None   Collection Time: 05/30/15 10:29 AM  Result Value Ref Range   Glucose-Capillary 86 65 - 99 mg/dL  Glucose, capillary     Status: None   Collection Time: 05/30/15  3:03 PM  Result Value Ref Range   Glucose-Capillary 89 65 - 99 mg/dL  Glucose, capillary     Status: Abnormal   Collection Time: 05/30/15  5:20 PM  Result Value Ref Range   Glucose-Capillary 108 (H) 65 - 99 mg/dL  Comment 1 Notify RN   Glucose, capillary     Status: Abnormal   Collection Time: 05/30/15  9:50 PM  Result Value Ref Range   Glucose-Capillary 114 (H) 65 - 99 mg/dL  Basic metabolic panel     Status: Abnormal   Collection Time: 05/31/15  5:21 AM  Result Value Ref Range   Sodium 144 135 - 145 mmol/L   Potassium 4.8 3.5 - 5.1 mmol/L   Chloride 111 101 - 111 mmol/L   CO2 23 22 - 32 mmol/L   Glucose, Bld 113 (H) 65 - 99 mg/dL   BUN 22 (H) 6 - 20 mg/dL    Creatinine, Ser 1.68 (H) 0.61 - 1.24 mg/dL   Calcium 8.9 8.9 - 10.3 mg/dL   GFR calc non Af Amer 41 (L) >60 mL/min   GFR calc Af Amer 48 (L) >60 mL/min    Comment: (NOTE) The eGFR has been calculated using the CKD EPI equation. This calculation has not been validated in all clinical situations. eGFR's persistently <60 mL/min signify possible Chronic Kidney Disease.    Anion gap 10 5 - 15  CBC     Status: Abnormal   Collection Time: 05/31/15  5:21 AM  Result Value Ref Range   WBC 4.6 4.0 - 10.5 K/uL   RBC 4.36 4.22 - 5.81 MIL/uL   Hemoglobin 10.8 (L) 13.0 - 17.0 g/dL   HCT 35.2 (L) 39.0 - 52.0 %   MCV 80.7 78.0 - 100.0 fL   MCH 24.8 (L) 26.0 - 34.0 pg   MCHC 30.7 30.0 - 36.0 g/dL   RDW 17.9 (H) 11.5 - 15.5 %   Platelets 96 (L) 150 - 400 K/uL    Comment: CONSISTENT WITH PREVIOUS RESULT  Magnesium     Status: None   Collection Time: 05/31/15  5:21 AM  Result Value Ref Range   Magnesium 1.7 1.7 - 2.4 mg/dL  Glucose, capillary     Status: None   Collection Time: 05/31/15  7:15 AM  Result Value Ref Range   Glucose-Capillary 99 65 - 99 mg/dL  Glucose, capillary     Status: None   Collection Time: 05/31/15 12:55 PM  Result Value Ref Range   Glucose-Capillary 89 65 - 99 mg/dL  Glucose, capillary     Status: Abnormal   Collection Time: 05/31/15  4:53 PM  Result Value Ref Range   Glucose-Capillary 112 (H) 65 - 99 mg/dL   Comment 1 Notify RN   Glucose, capillary     Status: Abnormal   Collection Time: 05/31/15  9:56 PM  Result Value Ref Range   Glucose-Capillary 113 (H) 65 - 99 mg/dL    Imaging / Studies: No results found.  Medications / Allergies: per chart  Antibiotics: Anti-infectives    Start     Dose/Rate Route Frequency Ordered Stop   05/30/15 1315  sodium chloride 0.9 % 1,000 mL with gentamicin (GARAMYCIN) 240 mg, clindamycin (CLEOCIN) 900 mg infusion  Status:  Discontinued     10 mL/hr  Irrigation Continuous 05/30/15 1309 05/30/15 1312   05/30/15 1315  Sodium  Chloride 0.9% 1 L, with Gentamicin 240 mg, Clindamycin 900 mg Irrigation     100 mL/hr  Irrigation  Once 05/30/15 1312 05/30/15 1400   05/30/15 1145  ceFAZolin (ANCEF) IVPB 2g/100 mL premix     2 g 200 mL/hr over 30 Minutes Intravenous To [facility] 05/29/15 1016 05/30/15 1208        Note: Portions of this report  may have been transcribed using voice recognition software. Every effort was made to ensure accuracy; however, inadvertent computerized transcription errors may be present.   Any transcriptional errors that result from this process are unintentional.     Caleb Bauer, M.D., F.A.C.S. Gastrointestinal and Minimally Invasive Surgery [facility] [address], [place], [place] [address] [phone] Main / Paging   06/01/2015  CARE TEAM:  PCP: [name], MD  Outpatient Care Team: Patient Care Team: Tammy Arn Medal, MD as PCP - General (Family Medicine) Michael Boston, MD as Consulting Physician (General Surgery) Adrian Prows, MD as Consulting Physician (Cardiology)  Inpatient Treatment Team: Treatment Team: Attending Provider: Michael Boston, MD; Registered Nurse: Kennith Center, RN; Technician: Rudean Hitt, NT; Technician: Army Chaco, NT; Registered Nurse: Gardner Candle, RN

## 2015-06-01 NOTE — Care Management Obs Status (Signed)
[facility] NOTIFICATION   Patient Details  Name: KESLEY KEGG MRN: [id number] Date of Birth: 1950/10/20   Medicare Observation Status Notification Given:  Yes (Medicare obs procedure)    Marilu Favre, RN 06/01/2015, 7:46 AM

## 2015-06-01 NOTE — Progress Notes (Signed)
Discharge education provided before pt discharged home in stable condition. Pt voiced understanding discharge instructions. Pt walked off the unit independently

## 2015-06-02 NOTE — Anesthesia Postprocedure Evaluation (Signed)
Anesthesia Post Note  Patient: NASHTON KWASNIEWSKI  Procedure(s) Performed: Procedure(s) (LRB): LAPAROSCOPIC VENTRAL HERNIA REPAIR WITH MESH  (N/A) INSERTION OF MESH (N/A) LYSIS OF ADHESION (N/A)  Patient location during evaluation: PACU Anesthesia Type: General Level of consciousness: awake and alert Pain management: pain level controlled Vital Signs Assessment: post-procedure vital signs reviewed and stable Respiratory status: spontaneous breathing, nonlabored ventilation, respiratory function stable and patient connected to nasal cannula oxygen Cardiovascular status: blood pressure returned to baseline and stable Postop Assessment: no signs of nausea or vomiting Anesthetic complications: no    Last Vitals:  Filed Vitals:   05/31/15 2202 06/01/15 0535  BP: 144/75 138/80  Pulse: 66 65  Temp: 36.7 C 36.7 C  Resp: 17 18    Last Pain:  Filed Vitals:   06/01/15 1111  PainSc: 0-No pain                 Lucero Auzenne S

## 2015-07-03 ENCOUNTER — Encounter: Payer: Self-pay | Admitting: Cardiology

## 2015-07-03 ENCOUNTER — Encounter: Payer: Self-pay | Admitting: Internal Medicine

## 2015-07-03 ENCOUNTER — Telehealth: Payer: Self-pay

## 2015-07-03 ENCOUNTER — Ambulatory Visit (INDEPENDENT_AMBULATORY_CARE_PROVIDER_SITE_OTHER): Payer: Medicare Other | Admitting: Internal Medicine

## 2015-07-03 VITALS — BP 136/80 | HR 68 | Ht 70.5 in | Wt 267.2 lb

## 2015-07-03 DIAGNOSIS — Z7901 Long term (current) use of anticoagulants: Secondary | ICD-10-CM | POA: Diagnosis not present

## 2015-07-03 DIAGNOSIS — I4892 Unspecified atrial flutter: Secondary | ICD-10-CM | POA: Diagnosis not present

## 2015-07-03 DIAGNOSIS — Z86010 Personal history of colonic polyps: Secondary | ICD-10-CM

## 2015-07-03 NOTE — Telephone Encounter (Signed)
[facility] [address]  [place] [address]  07/03/2015   RE: Caleb Bauer DOB: Jul 24, 1950 MRN: [id number]   Dear Adrian Prows, MD,    We have scheduled the above patient for an endoscopic procedure. Our records show that he is on anticoagulation therapy.   Please advise as to how long the patient may come off his therapy of Eliquis prior to the colonoscopy procedure, which is scheduled for 08/06/15.  Please fax back/ or route the completed form to Patte Winkel [place], [place] at [phone].   Sincerely,    Silvano Rusk, MD, [facility]

## 2015-07-03 NOTE — Progress Notes (Signed)
   Subjective:    Patient ID: Caleb Bauer, male    DOB: 14-Jan-1951, 64 y.o.   MRN: [id number] Cc: needs colonoscopy - hx polyps HPI 65 yo man here w/ reported hx colonoscopy > 10 yrs ago and says polyps were removed. No records. No GI sxs now. He takes apixaban to reduce stroke risk in atrial flutter Recent hernia repair by Dr. Johney Maine - ventral hernia, has been effective and released by Dr. Johney Maine - 1 mo s/p  Medications, allergies, past medical history, past surgical history, family history and social history are reviewed and updated in the EMR.  Review of Systems All other ROS negative    Objective:   Physical Exam  @BP  136/80 mmHg  Pulse 68  Ht 5' 10.5" (1.791 m)  Wt 267 lb 4 oz (121.224 kg)  BMI 37.79 kg/m2@  General:  Well-developed, well-nourished and in no acute distress Eyes:  anicteric. Lungs: Clear to auscultation bilaterally. Heart:  S1S2, no rubs, murmurs, gallops. Abdomen:  soft, non-tender, no hepatosplenomegaly, hernia, or mass and BS+.  Rectal: Deferred til colonoscopy Lymph:  no cervical or supraclavicular adenopathy. Extremities:   no edema, cyanosis or clubbing Neuro:  A&O x 3.  Psych:  appropriate mood and  Affect.   Data Reviewed: Hgb 11 MCV 80 PLT 113 WBC 4.5 BUN 26 and creat 2.16 06/28/15 labs    Assessment & Plan:   Encounter Diagnoses  Name Primary?  Marland Kitchen Hx of colonic polyps Yes  . Atrial flutter, unspecified type ([place])   . Chronic anticoagulation - apixiban    Colonoscopy reasonable - > 10 yrs per pt since last and hx polyps will wait about 1 more month to do given recent hernia repair The risks and benefits as well as alternatives of endoscopic procedure(s) have been discussed and reviewed. All questions answered. The patient agrees to proceed. Extra risk of stroke off apixaban reviewed and he understands Will hold 2 d before and restart ASAP after Double check w/ cardiologist  I appreciate the opportunity to care for this patient. [id number],  Dola Factor, MD

## 2015-07-03 NOTE — Patient Instructions (Signed)
   You have been scheduled for a colonoscopy. Please follow written instructions given to you at your visit today.  Please pick up your prep supplies at the pharmacy . If you use inhalers (even only as needed), please bring them with you on the day of your procedure.   You will be contaced by our office prior to your procedure for directions on holding your Eliquis..  If you do not hear from our office 1 week prior to your scheduled procedure, please call [phone] to discuss.   I appreciate the opportunity to care for you.

## 2015-07-04 ENCOUNTER — Encounter: Payer: Self-pay | Admitting: Internal Medicine

## 2015-07-23 ENCOUNTER — Encounter (HOSPITAL_COMMUNITY): Payer: Self-pay | Admitting: *Deleted

## 2015-07-23 NOTE — Progress Notes (Signed)
Spoke with Dr. Orene Desanctis, Anesthesiologist about medical history  And showed him EKG from 07/24/2014 and 03/10/2014, along with Dr. Orene Desanctis reviewing Echo results from 03/12/2014. Per Dr. Orene Desanctis, patient needs to follow up with Cardiology before having this procedure on 08/06/2015 with Dr. Carlean Purl as patient has an EF 15-20%, HTN  , Diabetes, CHF, COPD, Atrial Flutter and severe cardiomyopathy. If any questions , Dr. Carlean Purl can call Dr. Orene Desanctis.

## 2015-07-23 NOTE — Progress Notes (Signed)
Spoke with Barbera Setters at Dr. Celesta Aver office and informed her that I routed a note from me speaking to Dr. Orene Desanctis about patient's history and Dr. Griffin Dakin recommendations before this procedure.

## 2015-07-23 NOTE — Telephone Encounter (Signed)
Spoke with Herbie Baltimore and informed him of the time period to hold the eliquis, he verbalized understanding and we went over his other medicines to take as well.

## 2015-07-23 NOTE — Telephone Encounter (Signed)
Left message for patient to call me back, Dr Einar Gip faxed a note back saying we can hold the Eliquis 48 hours prior to procedure and restart same day if no biopsy, otherwise 48 hours later he said.  I will have his note scanned into epic.

## 2015-07-24 ENCOUNTER — Telehealth: Payer: Self-pay

## 2015-07-24 NOTE — Telephone Encounter (Signed)
Patient was just seen by Dr Einar Gip on 07/17/15, they are faxing over the note.

## 2015-07-24 NOTE — Telephone Encounter (Signed)
-----   Message from Gatha Mayer, MD sent at 07/24/2015 11:16 AM EDT ----- Regarding: needs cardiology eval prior to colonoscopy Anesthesia wants cardiology to see patient prior to colonoscopy  See the note from anesthesia nurse  Needs to get in soon,  before 6/5 or we will need to reschedule I bet

## 2015-07-25 NOTE — Progress Notes (Signed)
04/03/2015-Echocardiogram on chart from Dr. Einar Gip. 07/17/2015-Last office visit from Dr. Einar Gip and EKG on chart. 07/03/2015-note from Dr. Einar Gip about patient holding Eliquis for procedure on 08/06/2015 on chart.

## 2015-07-25 NOTE — Telephone Encounter (Signed)
Never received the office note from Dr Einar Gip so called them back and the young lady informed me that the anesthesia dept. Called yesterday and she faxed it directly to them.

## 2015-08-06 ENCOUNTER — Encounter (HOSPITAL_COMMUNITY): Payer: Self-pay

## 2015-08-06 ENCOUNTER — Ambulatory Visit (HOSPITAL_COMMUNITY)
Admission: RE | Admit: 2015-08-06 | Discharge: 2015-08-06 | Disposition: A | Discharge status: Home / Self Care | Payer: Medicare Other | Source: Ambulatory Visit | Attending: Internal Medicine | Admitting: Internal Medicine

## 2015-08-06 ENCOUNTER — Encounter (HOSPITAL_COMMUNITY)
Admission: RE | Disposition: A | Discharge status: Home / Self Care | Payer: Self-pay | Source: Ambulatory Visit | Attending: Internal Medicine

## 2015-08-06 ENCOUNTER — Ambulatory Visit (HOSPITAL_COMMUNITY): Payer: Medicare Other | Admitting: Certified Registered Nurse Anesthetist

## 2015-08-06 DIAGNOSIS — I509 Heart failure, unspecified: Secondary | ICD-10-CM | POA: Diagnosis not present

## 2015-08-06 DIAGNOSIS — K5730 Diverticulosis of large intestine without perforation or abscess without bleeding: Secondary | ICD-10-CM | POA: Insufficient documentation

## 2015-08-06 DIAGNOSIS — N189 Chronic kidney disease, unspecified: Secondary | ICD-10-CM | POA: Diagnosis not present

## 2015-08-06 DIAGNOSIS — Z79899 Other long term (current) drug therapy: Secondary | ICD-10-CM | POA: Diagnosis not present

## 2015-08-06 DIAGNOSIS — Z86010 Personal history of colonic polyps: Secondary | ICD-10-CM | POA: Diagnosis not present

## 2015-08-06 DIAGNOSIS — Z87891 Personal history of nicotine dependence: Secondary | ICD-10-CM | POA: Insufficient documentation

## 2015-08-06 DIAGNOSIS — M1990 Unspecified osteoarthritis, unspecified site: Secondary | ICD-10-CM | POA: Insufficient documentation

## 2015-08-06 DIAGNOSIS — I129 Hypertensive chronic kidney disease with stage 1 through stage 4 chronic kidney disease, or unspecified chronic kidney disease: Secondary | ICD-10-CM | POA: Insufficient documentation

## 2015-08-06 DIAGNOSIS — I429 Cardiomyopathy, unspecified: Secondary | ICD-10-CM | POA: Insufficient documentation

## 2015-08-06 DIAGNOSIS — J449 Chronic obstructive pulmonary disease, unspecified: Secondary | ICD-10-CM | POA: Diagnosis not present

## 2015-08-06 DIAGNOSIS — E119 Type 2 diabetes mellitus without complications: Secondary | ICD-10-CM | POA: Diagnosis not present

## 2015-08-06 DIAGNOSIS — Z1211 Encounter for screening for malignant neoplasm of colon: Secondary | ICD-10-CM | POA: Insufficient documentation

## 2015-08-06 DIAGNOSIS — G4730 Sleep apnea, unspecified: Secondary | ICD-10-CM | POA: Diagnosis not present

## 2015-08-06 DIAGNOSIS — M109 Gout, unspecified: Secondary | ICD-10-CM | POA: Diagnosis not present

## 2015-08-06 DIAGNOSIS — D123 Benign neoplasm of transverse colon: Secondary | ICD-10-CM | POA: Diagnosis not present

## 2015-08-06 HISTORY — PX: COLONOSCOPY WITH PROPOFOL: SHX5780

## 2015-08-06 SURGERY — COLONOSCOPY WITH PROPOFOL
Anesthesia: Monitor Anesthesia Care

## 2015-08-06 MED ORDER — ONDANSETRON HCL 4 MG/2ML IJ SOLN
INTRAMUSCULAR | Status: DC | PRN
  Administered 2015-08-06: 4 mg via INTRAVENOUS

## 2015-08-06 MED ORDER — PROPOFOL 10 MG/ML IV BOLUS
INTRAVENOUS | Status: DC | PRN
  Administered 2015-08-06 (×4): 10 mg via INTRAVENOUS
  Administered 2015-08-06: 20 mg via INTRAVENOUS
  Administered 2015-08-06: 10 mg via INTRAVENOUS

## 2015-08-06 MED ORDER — LIDOCAINE HCL (CARDIAC) 20 MG/ML IV SOLN
INTRAVENOUS | Status: AC
  Filled 2015-08-06: qty 5

## 2015-08-06 MED ORDER — PROPOFOL 500 MG/50ML IV EMUL
INTRAVENOUS | Status: DC | PRN
  Administered 2015-08-06: 50 ug/kg/min via INTRAVENOUS

## 2015-08-06 MED ORDER — LACTATED RINGERS IV SOLN
INTRAVENOUS | Status: DC
  Administered 2015-08-06: 1000 mL via INTRAVENOUS

## 2015-08-06 MED ORDER — LIDOCAINE HCL (CARDIAC) 20 MG/ML IV SOLN
INTRAVENOUS | Status: DC | PRN
  Administered 2015-08-06: 100 mg via INTRAVENOUS

## 2015-08-06 MED ORDER — ONDANSETRON HCL 4 MG/2ML IJ SOLN
INTRAMUSCULAR | Status: AC
  Filled 2015-08-06: qty 2

## 2015-08-06 MED ORDER — PROPOFOL 10 MG/ML IV BOLUS
INTRAVENOUS | Status: AC
  Filled 2015-08-06: qty 60

## 2015-08-06 MED ORDER — SODIUM CHLORIDE 0.9 % IV SOLN: INTRAVENOUS | Status: DC

## 2015-08-06 SURGICAL SUPPLY — 22 items

## 2015-08-06 NOTE — Anesthesia Postprocedure Evaluation (Signed)
Anesthesia Post Note  Patient: Caleb Bauer  Procedure(s) Performed: Procedure(s) (LRB): COLONOSCOPY WITH PROPOFOL (N/A)  Patient location during evaluation: PACU Anesthesia Type: MAC Level of consciousness: awake and alert Pain management: pain level controlled Vital Signs Assessment: post-procedure vital signs reviewed and stable Respiratory status: spontaneous breathing, nonlabored ventilation, respiratory function stable and patient connected to nasal cannula oxygen Cardiovascular status: blood pressure returned to baseline and stable Postop Assessment: no signs of nausea or vomiting Anesthetic complications: no    Last Vitals:  Filed Vitals:   08/06/15 0940 08/06/15 0950  BP: 115/66 125/73  Pulse: 52 56  Temp:    Resp: 20 13    Last Pain: There were no vitals filed for this visit.               Rajvir Ernster S

## 2015-08-06 NOTE — Discharge Instructions (Signed)
° °  I found and removed one polyp. I will let you know pathology results and when to have another routine colonoscopy by mail.   You also have a condition called diverticulosis - common and not usually a problem. Please read the handout provided.  I appreciate the opportunity to care for you. Gatha Mayer, MD, FACG  YOU HAD AN ENDOSCOPIC PROCEDURE TODAY: Refer to the procedure report and other information in the discharge instructions given to you for any specific questions about what was found during the examination. If this information does not answer your questions, please call Dr. Celesta Aver office at [phone] to clarify.   YOU SHOULD EXPECT: Some feelings of bloating in the abdomen. Passage of more gas than usual. Walking can help get rid of the air that was put into your GI tract during the procedure and reduce the bloating. If you had a lower endoscopy (such as a colonoscopy or flexible sigmoidoscopy) you may notice spotting of blood in your stool or on the toilet paper. Some abdominal soreness may be present for a day or two, also.  DIET: Your first meal following the procedure should be a light meal and then it is ok to progress to your normal diet. A half-sandwich or bowl of soup is an example of a good first meal. Heavy or fried foods are harder to digest and may make you feel nauseous or bloated. Drink plenty of fluids but you should avoid alcoholic beverages for 24 hours.   ACTIVITY: Your care partner should take you home directly after the procedure. You should plan to take it easy, moving slowly for the rest of the day. You can resume normal activity the day after the procedure however YOU SHOULD NOT DRIVE, use power tools, machinery or perform tasks that involve climbing or major physical exertion for 24 hours (because of the sedation medicines used during the test).   SYMPTOMS TO REPORT IMMEDIATELY: A gastroenterologist can be reached at any hour. Please call [phone]   for any of the following symptoms:  Following lower endoscopy (colonoscopy, flexible sigmoidoscopy) Excessive amounts of blood in the stool  Significant tenderness, worsening of abdominal pains  Swelling of the abdomen that is new, acute  Fever of 100 or higher  Following upper endoscopy (EGD, EUS, ERCP, esophageal dilation) Vomiting of blood or coffee ground material  New, significant abdominal pain  New, significant chest pain or pain under the shoulder blades  Painful or persistently difficult swallowing  New shortness of breath  Black, tarry-looking or red, bloody stools  FOLLOW UP:  If any biopsies were taken you will be contacted by phone or by letter within the next 1-3 weeks. Call [phone]  if you have not heard about the biopsies in 3 weeks.  Please also call with any specific questions about appointments or follow up tests.

## 2015-08-06 NOTE — H&P (View-Only) (Signed)
[name]   Primary Care Physician:  [name], MD   Reason for Procedure:   Colon polyp surveillance  Plan:    Colonoscopy - The risks and benefits as well as alternatives of endoscopic procedure(s) have been discussed and reviewed. All questions answered. The patient agrees to proceed.      HPI: Caleb Bauer is a 65 y.o. male    65 yo man here w/ reported hx colonoscopy > 10 yrs ago and says polyps were removed. No records. No GI sxs now. He takes apixaban to reduce stroke risk in atrial flutter Recent hernia repair by Dr. Johney Maine - ventral hernia, has been effective and released by Dr. Johney Maine - 1 mo s/p Past Medical History  Diagnosis Date  . Diabetes mellitus without complication ([place])   . CHF (congestive heart failure) ([place])   . Hypertension   . 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 Small bowel obstruction ([place]) 10/2012  . COPD (chronic obstructive pulmonary disease) ([place])   . Herpes     BUTT AND BACK- 9/23 HEALED  . Atrial flutter by electrocardiogram ([place]) 11/2012  . Gout   . Chronic kidney disease   . Sleep apnea     never used cpap but has one  . GERD (gastroesophageal reflux disease)     appt 5/17  . Arthritis   . Acute renal failure ([place]) 11/23/2012  . Enteritis due to Clostridium difficile 03/12/2014  . HCAP (healthcare-associated pneumonia) 03/24/2013    HCAP 03/09/13 >tx w/ abx  cXR 03/23/13 >improved but persistent bibasilar consolidation>cxr >   . Encephalopathy acute 03/14/2013  . Herpes zoster 04/18/2013  . Gout flare 09/12/2013  . UTI (urinary tract infection) 09/15/2013  . Colon polyps   . Sepsis due to undetermined organism with acute respiratory failure ([place]) 03/10/2013    Past Surgical History  Procedure Laterality Date  . Laparoscopic incisional / umbilical / ventral hernia repair    . Ventral hernia repair N/A 05/30/2015    Procedure: LAPAROSCOPIC VENTRAL HERNIA REPAIR WITH MESH ;  Surgeon: Michael Boston, MD;   Location: [place];  Service: General;  Laterality: N/A;  . Insertion of mesh N/A 05/30/2015    Procedure: INSERTION OF MESH;  Surgeon: Michael Boston, MD;  Location: [place];  Service: General;  Laterality: N/A;  . Lysis of adhesion N/A 05/30/2015    Procedure: LYSIS OF ADHESION;  Surgeon: Michael Boston, MD;  Location: [place];  Service: General;  Laterality: N/A;  . Hernia repair      x 3    Prior to Admission medications   Medication Sig Start Date End Date Taking? Authorizing Provider  albuterol (PROVENTIL) (2.5 MG/3ML) 0.083% nebulizer solution Take 2.5 mg by nebulization every 6 (six) hours as needed for wheezing or shortness of breath.   Yes Historical Provider, MD  allopurinol (ZYLOPRIM) 100 MG tablet TAKE ONE TABLET BY MOUTH ONCE DAILY 09/13/14  Yes Jolaine Artist, MD  amiodarone (PACERONE) 200 MG tablet Take 1 tablet (200 mg total) by mouth daily. 06/06/13  Yes Jolaine Artist, MD  apixaban (ELIQUIS) 5 MG TABS tablet Take 5 mg by mouth 2 (two) times daily.   Yes Historical Provider, MD  Caffeine-Magnesium Salicylate (DIUREX PO) Take 1 tablet by mouth daily as needed (For water weight or bloating.).    Yes Historical Provider, MD  carvedilol (COREG) 3.125 MG tablet Take 1 tablet (3.125 mg total) by mouth 2 (two) times daily. 06/06/13  Yes Shaune Pascal  Bensimhon, MD  colchicine 0.6 MG tablet Take 1 tablet (0.6 mg total) by mouth 2 (two) times daily as needed (gout flair). 03/16/14  Yes Delfina Redwood, MD  digoxin (LANOXIN) 0.25 MG tablet Take 0.125 mg by mouth every other day.   Yes Historical Provider, MD  hydrALAZINE (APRESOLINE) 10 MG tablet Take 10 mg by mouth 3 (three) times daily.   Yes Historical Provider, MD  isosorbide dinitrate (ISORDIL) 10 MG tablet Take 10 mg by mouth 3 (three) times daily.   Yes Historical Provider, MD  lisinopril (PRINIVIL,ZESTRIL) 2.5 MG tablet Take 2.5 mg by mouth daily.   Yes Historical Provider, MD  metolazone (ZAROXOLYN) 5 MG tablet Take 5 mg by mouth daily.    Yes Historical Provider, MD  torsemide (DEMADEX) 20 MG tablet Take 2 tablets (40 mg total) by mouth 2 (two) times a week. Every Monday and Friday, Hold for wt less than 193 lb 03/16/14  Yes Delfina Redwood, MD  traMADol (ULTRAM) 50 MG tablet Take 1-2 tablets (50-100 mg total) by mouth every 6 (six) hours as needed for moderate pain or severe pain. 05/30/15  Yes Michael Boston, MD    Current Facility-Administered Medications  Medication Dose Route Frequency Provider Last Rate Last Dose  . 0.9 %  sodium chloride infusion   Intravenous Continuous Gatha Mayer, MD      . lactated ringers infusion   Intravenous Continuous Gatha Mayer, MD 10 mL/hr at 08/06/15 0809 1,000 mL at 08/06/15 0809   Facility-Administered Medications Ordered in Other Encounters  Medication Dose Route Frequency Provider Last Rate Last Dose  . propofol (DIPRIVAN) 10 mg/mL bolus/IV push    Anesthesia Intra-op Carter Kitten, CRNA   40 mg at 09/12/12 0000  . succinylcholine (ANECTINE) injection    Anesthesia Intra-op Carter Kitten, CRNA   120 mg at 05/30/15 1206    Allergies as of 07/03/2015 - Review Complete 07/03/2015  Allergen Reaction Noted  . Other Nausea And Vomiting 09/10/2012    Family History  Problem Relation Age of Onset  . Cancer Neg Hx   . Diabetes Brother   . Alzheimer's disease Mother     Social History   Social History  . Marital Status: Single    Spouse Name: N/A  . Number of Children: 0  . Years of Education: N/A   Occupational History  . retired    Social History Main Topics  . Smoking status: Former Smoker -- 1.00 packs/day for 30 years    Types: Cigarettes    Quit date: 09/28/2012  . Smokeless tobacco: Never Used  . Alcohol Use: No     Comment: 2015 last alcohol"  . Drug Use: No  . Sexual Activity: No   Other Topics Concern  . Not on file   Social History Narrative    Review of Systems: All other review of systems negative except as mentioned in the HPI.  Physical  Exam: Vital signs in last 24 hours: Temp:  [98.1 F (36.7 C)] 98.1 F (36.7 C) (06/05 0800) Pulse Rate:  [64] 64 (06/05 0800) Resp:  [14] 14 (06/05 0800) BP: (115)/(70) 115/70 mmHg (06/05 0800) SpO2:  [96 %] 96 % (06/05 0800) Weight:  [267 lb (121.11 kg)] 267 lb (121.11 kg) (06/05 0800)   General:   Alert,  Well-developed, well-nourished, pleasant and cooperative in NAD Lungs:  Clear throughout to auscultation.   Heart:  Regular rate and rhythm; no murmurs, clicks, rubs,  or gallops. Abdomen:  Soft, nontender and nondistended. Normal bowel sounds.   Neuro/Psych:  Alert and cooperative. Normal mood and affect. A and O x 3   @Carl  Simonne Maffucci, MD, [facility] [phone] (pager) 08/06/2015 8:49 AM@

## 2015-08-06 NOTE — Transfer of Care (Signed)
Immediate Anesthesia Transfer of Care Note  Patient: Caleb Bauer  Procedure(s) Performed: Procedure(s): COLONOSCOPY WITH PROPOFOL (N/A)  Patient Location: ENDO  Anesthesia Type:MAC  Level of Consciousness:  sedated, patient cooperative and responds to stimulation  Airway & Oxygen Therapy:Patient Spontanous Breathing and Patient connected to face mask oxgen  Post-op Assessment:  Report given to ENDO RN and Post -op Vital signs reviewed and stable  Post vital signs:  Reviewed and stable  Last Vitals:  Filed Vitals:   08/06/15 0800  BP: 115/70  Pulse: 64  Temp: 36.7 C  Resp: 14    Complications: No apparent anesthesia complications

## 2015-08-06 NOTE — Interval H&P Note (Signed)
History and Physical Interval Note:  08/06/2015 1:10 PM  Caleb Bauer  has presented today for surgery, with the diagnosis of hx of polyps  The various methods of treatment have been discussed with the patient and family. After consideration of risks, benefits and other options for treatment, the patient has consented to  Procedure(s): COLONOSCOPY WITH PROPOFOL (N/A) as a surgical intervention .  The patient's history has been reviewed, patient examined, no change in status, stable for surgery.  I have reviewed the patient's chart and labs.  Questions were answered to the patient's satisfaction.     Silvano Rusk

## 2015-08-06 NOTE — Progress Notes (Signed)
[facility] History and Physical   Primary Care Physician:  [name], MD   Reason for Procedure:   Colon polyp surveillance  Plan:    Colonoscopy - The risks and benefits as well as alternatives of endoscopic procedure(s) have been discussed and reviewed. All questions answered. The patient agrees to proceed.      HPI: Caleb Bauer is a 65 y.o. male    65 yo man here w/ reported hx colonoscopy > 10 yrs ago and says polyps were removed. No records. No GI sxs now. He takes apixaban to reduce stroke risk in atrial flutter Recent hernia repair by Dr. Johney Maine - ventral hernia, has been effective and released by Dr. Johney Maine - 1 mo s/p Past Medical History  Diagnosis Date  . Diabetes mellitus without complication ([place])   . CHF (congestive heart failure) ([place])   . Hypertension   . Cardiomyopathy, dilated ([place])   . Small bowel obstruction ([place]) 10/2012  . COPD (chronic obstructive pulmonary disease) ([place])   . Herpes     BUTT AND BACK- 9/23 HEALED  . Atrial flutter by electrocardiogram ([place]) 11/2012  . Gout   . Chronic kidney disease   . Sleep apnea     never used cpap but has one  . GERD (gastroesophageal reflux disease)     appt 5/17  . Arthritis   . Acute renal failure ([place]) 11/23/2012  . Enteritis due to Clostridium difficile 03/12/2014  . HCAP (healthcare-associated pneumonia) 03/24/2013    HCAP 03/09/13 >tx w/ abx  cXR 03/23/13 >improved but persistent bibasilar consolidation>cxr >   . Encephalopathy acute 03/14/2013  . Herpes zoster 04/18/2013  . Gout flare 09/12/2013  . UTI (urinary tract infection) 09/15/2013  . Colon polyps   . Sepsis due to undetermined organism with acute respiratory failure ([place]) 03/10/2013    Past Surgical History  Procedure Laterality Date  . Laparoscopic incisional / umbilical / ventral hernia repair    . Ventral hernia repair N/A 05/30/2015    Procedure: LAPAROSCOPIC VENTRAL HERNIA REPAIR WITH MESH ;  Surgeon: Michael Boston, MD;   Location: [facility];  Service: General;  Laterality: N/A;  . Insertion of mesh N/A 05/30/2015    Procedure: INSERTION OF MESH;  Surgeon: Michael Boston, MD;  Location: [place] [facility];  Service: General;  Laterality: N/A;  . Lysis of adhesion N/A 05/30/2015    Procedure: LYSIS OF ADHESION;  Surgeon: Michael Boston, MD;  Location: [place] [place];  Service: General;  Laterality: N/A;  . Hernia repair      x 3    Prior to Admission medications   Medication Sig Start Date End Date Taking? Authorizing Provider  albuterol (PROVENTIL) (2.5 MG/3ML) 0.083% nebulizer solution Take 2.5 mg by nebulization every 6 (six) hours as needed for wheezing or shortness of breath.   Yes Historical Provider, MD  allopurinol (ZYLOPRIM) 100 MG tablet TAKE ONE TABLET BY MOUTH ONCE DAILY 09/13/14  Yes Jolaine Artist, MD  amiodarone (PACERONE) 200 MG tablet Take 1 tablet (200 mg total) by mouth daily. 06/06/13  Yes Jolaine Artist, MD  apixaban (ELIQUIS) 5 MG TABS tablet Take 5 mg by mouth 2 (two) times daily.   Yes Historical Provider, MD  Caffeine-Magnesium Salicylate (DIUREX PO) Take 1 tablet by mouth daily as needed (For water weight or bloating.).    Yes Historical Provider, MD  carvedilol (COREG) 3.125 MG tablet Take 1 tablet (3.125 mg total) by mouth 2 (two) times daily. 06/06/13  Yes Shaune Pascal  Bensimhon, MD  colchicine 0.6 MG tablet Take 1 tablet (0.6 mg total) by mouth 2 (two) times daily as needed (gout flair). 03/16/14  Yes Delfina Redwood, MD  digoxin (LANOXIN) 0.25 MG tablet Take 0.125 mg by mouth every other day.   Yes Historical Provider, MD  hydrALAZINE (APRESOLINE) 10 MG tablet Take 10 mg by mouth 3 (three) times daily.   Yes Historical Provider, MD  isosorbide dinitrate (ISORDIL) 10 MG tablet Take 10 mg by mouth 3 (three) times daily.   Yes Historical Provider, MD  lisinopril (PRINIVIL,ZESTRIL) 2.5 MG tablet Take 2.5 mg by mouth daily.   Yes Historical Provider, MD  metolazone (ZAROXOLYN) 5 MG tablet Take 5 mg by mouth daily.    Yes Historical Provider, MD  torsemide (DEMADEX) 20 MG tablet Take 2 tablets (40 mg total) by mouth 2 (two) times a week. Every Monday and Friday, Hold for wt less than 193 lb 03/16/14  Yes Delfina Redwood, MD  traMADol (ULTRAM) 50 MG tablet Take 1-2 tablets (50-100 mg total) by mouth every 6 (six) hours as needed for moderate pain or severe pain. 05/30/15  Yes Michael Boston, MD    Current Facility-Administered Medications  Medication Dose Route Frequency Provider Last Rate Last Dose  . 0.9 %  sodium chloride infusion   Intravenous Continuous Gatha Mayer, MD      . lactated ringers infusion   Intravenous Continuous Gatha Mayer, MD 10 mL/hr at 08/06/15 0809 1,000 mL at 08/06/15 0809   Facility-Administered Medications Ordered in Other Encounters  Medication Dose Route Frequency Provider Last Rate Last Dose  . propofol (DIPRIVAN) 10 mg/mL bolus/IV push    Anesthesia Intra-op Carter Kitten, CRNA   40 mg at 09/12/12 0000  . succinylcholine (ANECTINE) injection    Anesthesia Intra-op Carter Kitten, CRNA   120 mg at 05/30/15 1206    Allergies as of 07/03/2015 - Review Complete 07/03/2015  Allergen Reaction Noted  . Other Nausea And Vomiting 09/10/2012    Family History  Problem Relation Age of Onset  . Cancer Neg Hx   . Diabetes Brother   . Alzheimer's disease Mother     Social History   Social History  . Marital Status: Single    Spouse Name: N/A  . Number of Children: 0  . Years of Education: N/A   Occupational History  . retired    Social History Main Topics  . Smoking status: Former Smoker -- 1.00 packs/day for 30 years    Types: Cigarettes    Quit date: 09/28/2012  . Smokeless tobacco: Never Used  . Alcohol Use: No     Comment: 2015 last alcohol"  . Drug Use: No  . Sexual Activity: No   Other Topics Concern  . Not on file   Social History Narrative    Review of Systems: All other review of systems negative except as mentioned in the HPI.  Physical  Exam: Vital signs in last 24 hours: Temp:  [98.1 F (36.7 C)] 98.1 F (36.7 C) (06/05 0800) Pulse Rate:  [64] 64 (06/05 0800) Resp:  [14] 14 (06/05 0800) BP: (115)/(70) 115/70 mmHg (06/05 0800) SpO2:  [96 %] 96 % (06/05 0800) Weight:  [267 lb (121.11 kg)] 267 lb (121.11 kg) (06/05 0800)   General:   Alert,  Well-developed, well-nourished, pleasant and cooperative in NAD Lungs:  Clear throughout to auscultation.   Heart:  Regular rate and rhythm; no murmurs, clicks, rubs,  or gallops. Abdomen:  Soft, nontender and nondistended. Normal bowel sounds.   Neuro/Psych:  Alert and cooperative. Normal mood and affect. A and O x 3   @Melodee Lupe  Simonne Maffucci, MD, [facility] [phone] (pager) 08/06/2015 8:49 AM@

## 2015-08-06 NOTE — Anesthesia Preprocedure Evaluation (Signed)
Anesthesia Evaluation  Patient identified by MRN, date of birth, ID band Patient awake    Reviewed: Allergy & Precautions, NPO status , Patient's Chart, lab work & pertinent test results  Airway Mallampati: II  TM Distance: >3 FB Neck ROM: Full    Dental no notable dental hx.    Pulmonary sleep apnea , COPD,  COPD inhaler, former smoker,    breath sounds clear to auscultation + decreased breath sounds      Cardiovascular hypertension, Pt. on medications +CHF  Normal cardiovascular exam Rhythm:Regular Rate:Normal  Left ventricle: The cavity size was severely dilated. There was mild concentric hypertrophy. Systolic function was severely reduced. The estimated ejection fraction was in the range of 15% to 20%. Diffuse hypokinesis. There was a reduced contribution of atrial contraction to ventricular filling, due to increased ventricular diastolic pressure or atrial contractile dysfunction. Doppler parameters are consistent with a reversible restrictive pattern, indicative of decreased left ventricular diastolic compliance and/or increased left atrial pressure (grade 3 diastolic dysfunction). - Ventricular septum: These changes are consistent with a left bundle branch block. - Mitral valve: There was mild regurgitation. - Left atrium: The atrium was moderately to severely dilated. - Right ventricle: The cavity size was moderately dilated. Systolic function was moderately reduced. - Atrial septum: No defect or patent foramen ovale was identified. - Tricuspid valve: Moderately dilated annulus. - Inferior vena cava: The vessel was dilated. The respirophasic diameter changes were blunted (< 50%), consistent with elevated central venous pressure. - Pericardium, extracardiac: A small pericardial effusion was identified posterior to the heart. The fluid had no internal echoes.   Neuro/Psych negative  neurological ROS  negative psych ROS   GI/Hepatic negative GI ROS, Neg liver ROS,   Endo/Other  diabetes  Renal/GU negative Renal ROS  negative genitourinary   Musculoskeletal negative musculoskeletal ROS (+)   Abdominal   Peds negative pediatric ROS (+)  Hematology negative hematology ROS (+)   Anesthesia Other Findings   Reproductive/Obstetrics negative OB ROS                             Anesthesia Physical Anesthesia Plan  ASA: IV  Anesthesia Plan: MAC   Post-op Pain Management:    Induction: Intravenous  Airway Management Planned: Nasal Cannula  Additional Equipment:   Intra-op Plan:   Post-operative Plan:   Informed Consent: I have reviewed the patients History and Physical, chart, labs and discussed the procedure including the risks, benefits and alternatives for the proposed anesthesia with the patient or authorized representative who has indicated his/her understanding and acceptance.   Dental advisory given  Plan Discussed with: CRNA and Surgeon  Anesthesia Plan Comments:         Anesthesia Quick Evaluation

## 2015-08-06 NOTE — Op Note (Signed)
[facility] Patient Name: Caleb Bauer Procedure Date: 08/06/2015 MRN: [id number] Attending MD: Gatha Mayer , MD Date of Birth: 03-12-1950 CSN: [id number] Age: 65 Admit Type: Outpatient Procedure:                Colonoscopy Indications:              Surveillance: Personal history of adenomatous                            polyps on last colonoscopy > 5 years ago Providers:                Gatha Mayer, MD, Sarah Monday, RN, William Dalton, Technician Referring MD:              Medicines:                Monitored Anesthesia Care Complications:            No immediate complications. Estimated Blood Loss:     Estimated blood loss was minimal. Procedure:                Pre-Anesthesia Assessment:                           - Prior to the procedure, a History and Physical                            was performed, and patient medications and                            allergies were reviewed. The patient's tolerance of                            previous anesthesia was also reviewed. The risks                            and benefits of the procedure and the sedation                            options and risks were discussed with the patient.                            All questions were answered, and informed consent                            was obtained. Prior Anticoagulants: The patient                            last took Eliquis (apixaban) 2 days prior to the                            procedure. ASA Grade Assessment: III - A patient  with severe systemic disease. After reviewing the                            risks and benefits, the patient was deemed in                            satisfactory condition to undergo the procedure.                           After obtaining informed consent, the colonoscope                            was passed under direct vision. Throughout the   procedure, the patient's blood pressure, pulse, and                            oxygen saturations were monitored continuously. The                            [id number] scope was introduced through                            the anus and advanced to the the cecum, identified                            by appendiceal orifice and ileocecal valve. The                            colonoscopy was performed without difficulty. The                            patient tolerated the procedure well. The quality                            of the bowel preparation was good. The bowel                            preparation used was Miralax. The ileocecal valve,                            appendiceal orifice, and rectum were photographed. Scope In: Scope Out: Findings:      The perianal and digital rectal examinations were normal. Pertinent       negatives include normal prostate (size, shape, and consistency).      A 5 mm polyp was found in the transverse colon. The polyp was sessile.       The polyp was removed with a cold snare. Resection and retrieval were       complete. Verification of patient identification for the specimen was       done. Estimated blood loss was minimal.      Many diverticula were found in the sigmoid colon. There was no evidence       of diverticular bleeding.      The exam was otherwise without abnormality on direct and retroflexion       views.  Impression:               - One 5 mm polyp in the transverse colon, removed                            with a cold snare. Resected and retrieved.                           - Severe diverticulosis in the sigmoid colon. There                            was no evidence of diverticular bleeding.                           - The examination was otherwise normal on direct                            and retroflexion views.                           - Personal history of colonic polyps. Moderate Sedation:      Please see anesthesia  notes, moderate sedation not given Recommendation:           - Patient has a contact number available for                            emergencies. The signs and symptoms of potential                            delayed complications were discussed with the                            patient. Return to normal activities tomorrow.                            Written discharge instructions were provided to the                            patient.                           - Resume previous diet.                           - Continue present medications.                           - Resume Eliquis (apixaban) at prior dose today.                           - Repeat colonoscopy is recommended. The                            colonoscopy date will be determined after pathology  results from today's exam become available for                            review. Procedure Code(s):        --- Professional ---                           [phone], Colonoscopy, flexible; with removal of                            tumor(s), polyp(s), or other lesion(s) by snare                            technique Diagnosis Code(s):        --- Professional ---                           Z86.010, Personal history of colonic polyps                           D12.3, Benign neoplasm of transverse colon (hepatic                            flexure or splenic flexure)                           K57.30, Diverticulosis of large intestine without                            perforation or abscess without bleeding CPT copyright 2016 [employer]. All rights reserved. The codes documented in this report are preliminary and upon coder review may  be revised to meet current compliance requirements. Gatha Mayer, MD 08/06/2015 9:48:38 AM This report has been signed electronically. Number of Addenda: 0

## 2015-08-07 ENCOUNTER — Encounter (HOSPITAL_COMMUNITY): Payer: Self-pay | Admitting: Internal Medicine

## 2015-08-13 ENCOUNTER — Encounter: Payer: Self-pay | Admitting: Internal Medicine

## 2015-08-13 NOTE — Progress Notes (Signed)
Quick Note:  5 mm adenoma Colon recall 2022 ______

## 2015-08-13 NOTE — Progress Notes (Signed)
Quick Note:  Letter created\please cc his pcp ______

## 2016-04-08 ENCOUNTER — Ambulatory Visit (INDEPENDENT_AMBULATORY_CARE_PROVIDER_SITE_OTHER): Payer: Medicare Other | Admitting: Podiatry

## 2016-04-08 ENCOUNTER — Encounter: Payer: Self-pay | Admitting: Podiatry

## 2016-04-08 VITALS — BP 113/78 | HR 84 | Resp 18 | Ht 72.0 in | Wt 247.0 lb

## 2016-04-08 DIAGNOSIS — M79675 Pain in left toe(s): Secondary | ICD-10-CM | POA: Diagnosis not present

## 2016-04-08 DIAGNOSIS — B351 Tinea unguium: Secondary | ICD-10-CM

## 2016-04-08 DIAGNOSIS — M79674 Pain in right toe(s): Secondary | ICD-10-CM

## 2016-04-08 NOTE — Patient Instructions (Signed)

## 2016-04-08 NOTE — Progress Notes (Signed)
   Subjective:    Patient ID: Caleb Bauer, male    DOB: [birth year], 66 y.o.   MRN: [id number]  HPI   This patient presents today complaining of extremely uncomfortable toenails when walking wearing shoes request toenail debridement. Patient's last visit to a podiatrist was 1+ years ago for toenail debridement. Patient is not able trim your toenails where she had any other professional management of the deformed toenails. The nails are grams become thicker and more deformed over time  Patient is diabetic and denies any history of foot ulceration, claudication or amputation     Review of Systems  Genitourinary: Positive for urgency.       Objective:   Physical Exam  Orientated 3  Mild pitting edema bilaterally DP pulses 2/4 bilaterally PT pulses 2/4 bilaterally Capillary reflex immediate bilaterally  Neurological: Sensation to 10 g monofilament wire intact 3/5 right 4/5 left Vibratory sensation reactive bilaterally Ankle reflexes reactive bilaterally  Dermatological: No open skin lesions bilaterally The toenails are extremely elongated, hypertrophic, discolored, deformed and tender to direct palpation 6-10  Musculoskeletal: HAV bilaterally Hammertoe second left Manual motor testing dorsi flexion, plantar flexion, inversion, eversion 5/5 bilaterally      Assessment & Plan:   Assessment: Diabetic with satisfactory neurovascular status 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 Patient verbally consents. The toenails 6-10 were debrided mechanically and electrically without any bleeding  Reappoint 3 month

## 2016-06-27 ENCOUNTER — Inpatient Hospital Stay (HOSPITAL_COMMUNITY)
Admission: EM | Admit: 2016-06-27 | Discharge: 2016-07-14 | DRG: 291 | Disposition: A | Discharge status: Skilled Nursing Facility | Payer: Medicare Other | Attending: Internal Medicine | Admitting: Internal Medicine

## 2016-06-27 ENCOUNTER — Emergency Department (HOSPITAL_COMMUNITY): Payer: Medicare Other

## 2016-06-27 ENCOUNTER — Encounter (HOSPITAL_COMMUNITY): Payer: Self-pay

## 2016-06-27 DIAGNOSIS — I480 Paroxysmal atrial fibrillation: Secondary | ICD-10-CM | POA: Diagnosis not present

## 2016-06-27 DIAGNOSIS — N183 Chronic kidney disease, stage 3 unspecified: Secondary | ICD-10-CM | POA: Diagnosis present

## 2016-06-27 DIAGNOSIS — E119 Type 2 diabetes mellitus without complications: Secondary | ICD-10-CM

## 2016-06-27 DIAGNOSIS — I426 Alcoholic cardiomyopathy: Secondary | ICD-10-CM | POA: Diagnosis present

## 2016-06-27 DIAGNOSIS — I4892 Unspecified atrial flutter: Secondary | ICD-10-CM | POA: Diagnosis present

## 2016-06-27 DIAGNOSIS — G4730 Sleep apnea, unspecified: Secondary | ICD-10-CM | POA: Diagnosis present

## 2016-06-27 DIAGNOSIS — Z87891 Personal history of nicotine dependence: Secondary | ICD-10-CM | POA: Diagnosis not present

## 2016-06-27 DIAGNOSIS — Z833 Family history of diabetes mellitus: Secondary | ICD-10-CM

## 2016-06-27 DIAGNOSIS — L97819 Non-pressure chronic ulcer of other part of right lower leg with unspecified severity: Secondary | ICD-10-CM | POA: Diagnosis present

## 2016-06-27 DIAGNOSIS — Z515 Encounter for palliative care: Secondary | ICD-10-CM | POA: Diagnosis not present

## 2016-06-27 DIAGNOSIS — I420 Dilated cardiomyopathy: Secondary | ICD-10-CM | POA: Diagnosis present

## 2016-06-27 DIAGNOSIS — Z7189 Other specified counseling: Secondary | ICD-10-CM | POA: Diagnosis not present

## 2016-06-27 DIAGNOSIS — E876 Hypokalemia: Secondary | ICD-10-CM | POA: Diagnosis present

## 2016-06-27 DIAGNOSIS — L8990 Pressure ulcer of unspecified site, unspecified stage: Secondary | ICD-10-CM | POA: Diagnosis present

## 2016-06-27 DIAGNOSIS — E669 Obesity, unspecified: Secondary | ICD-10-CM | POA: Diagnosis present

## 2016-06-27 DIAGNOSIS — N179 Acute kidney failure, unspecified: Secondary | ICD-10-CM | POA: Diagnosis present

## 2016-06-27 DIAGNOSIS — E1122 Type 2 diabetes mellitus with diabetic chronic kidney disease: Secondary | ICD-10-CM | POA: Diagnosis present

## 2016-06-27 DIAGNOSIS — E064 Drug-induced thyroiditis: Secondary | ICD-10-CM | POA: Diagnosis not present

## 2016-06-27 DIAGNOSIS — N390 Urinary tract infection, site not specified: Secondary | ICD-10-CM | POA: Diagnosis not present

## 2016-06-27 DIAGNOSIS — I110 Hypertensive heart disease with heart failure: Secondary | ICD-10-CM | POA: Diagnosis not present

## 2016-06-27 DIAGNOSIS — I130 Hypertensive heart and chronic kidney disease with heart failure and stage 1 through stage 4 chronic kidney disease, or unspecified chronic kidney disease: Principal | ICD-10-CM | POA: Diagnosis present

## 2016-06-27 DIAGNOSIS — E783 Hyperchylomicronemia: Secondary | ICD-10-CM | POA: Diagnosis not present

## 2016-06-27 DIAGNOSIS — I5043 Acute on chronic combined systolic (congestive) and diastolic (congestive) heart failure: Secondary | ICD-10-CM | POA: Diagnosis present

## 2016-06-27 DIAGNOSIS — Z9114 Patient's other noncompliance with medication regimen: Secondary | ICD-10-CM

## 2016-06-27 DIAGNOSIS — R600 Localized edema: Secondary | ICD-10-CM | POA: Diagnosis present

## 2016-06-27 DIAGNOSIS — J449 Chronic obstructive pulmonary disease, unspecified: Secondary | ICD-10-CM | POA: Diagnosis present

## 2016-06-27 DIAGNOSIS — Z9119 Patient's noncompliance with other medical treatment and regimen: Secondary | ICD-10-CM

## 2016-06-27 DIAGNOSIS — M1A00X0 Idiopathic chronic gout, unspecified site, without tophus (tophi): Secondary | ICD-10-CM | POA: Diagnosis present

## 2016-06-27 DIAGNOSIS — T502X5A Adverse effect of carbonic-anhydrase inhibitors, benzothiadiazides and other diuretics, initial encounter: Secondary | ICD-10-CM | POA: Diagnosis not present

## 2016-06-27 DIAGNOSIS — J9600 Acute respiratory failure, unspecified whether with hypoxia or hypercapnia: Secondary | ICD-10-CM | POA: Diagnosis present

## 2016-06-27 DIAGNOSIS — D509 Iron deficiency anemia, unspecified: Secondary | ICD-10-CM | POA: Diagnosis present

## 2016-06-27 DIAGNOSIS — I119 Hypertensive heart disease without heart failure: Secondary | ICD-10-CM | POA: Diagnosis present

## 2016-06-27 DIAGNOSIS — L03116 Cellulitis of left lower limb: Secondary | ICD-10-CM | POA: Diagnosis present

## 2016-06-27 DIAGNOSIS — I5023 Acute on chronic systolic (congestive) heart failure: Secondary | ICD-10-CM | POA: Diagnosis not present

## 2016-06-27 DIAGNOSIS — R609 Edema, unspecified: Secondary | ICD-10-CM | POA: Diagnosis not present

## 2016-06-27 DIAGNOSIS — K219 Gastro-esophageal reflux disease without esophagitis: Secondary | ICD-10-CM | POA: Diagnosis present

## 2016-06-27 DIAGNOSIS — N184 Chronic kidney disease, stage 4 (severe): Secondary | ICD-10-CM | POA: Diagnosis present

## 2016-06-27 DIAGNOSIS — I1310 Hypertensive heart and chronic kidney disease without heart failure, with stage 1 through stage 4 chronic kidney disease, or unspecified chronic kidney disease: Secondary | ICD-10-CM | POA: Diagnosis present

## 2016-06-27 DIAGNOSIS — T462X5A Adverse effect of other antidysrhythmic drugs, initial encounter: Secondary | ICD-10-CM | POA: Diagnosis not present

## 2016-06-27 DIAGNOSIS — E0580 Other thyrotoxicosis without thyrotoxic crisis or storm: Secondary | ICD-10-CM | POA: Diagnosis present

## 2016-06-27 DIAGNOSIS — E873 Alkalosis: Secondary | ICD-10-CM | POA: Diagnosis not present

## 2016-06-27 DIAGNOSIS — M1990 Unspecified osteoarthritis, unspecified site: Secondary | ICD-10-CM | POA: Diagnosis present

## 2016-06-27 DIAGNOSIS — M79609 Pain in unspecified limb: Secondary | ICD-10-CM | POA: Diagnosis not present

## 2016-06-27 DIAGNOSIS — R079 Chest pain, unspecified: Secondary | ICD-10-CM | POA: Diagnosis present

## 2016-06-27 DIAGNOSIS — G9341 Metabolic encephalopathy: Secondary | ICD-10-CM | POA: Diagnosis not present

## 2016-06-27 DIAGNOSIS — R4182 Altered mental status, unspecified: Secondary | ICD-10-CM | POA: Diagnosis not present

## 2016-06-27 DIAGNOSIS — Z86010 Personal history of colonic polyps: Secondary | ICD-10-CM

## 2016-06-27 DIAGNOSIS — Z7901 Long term (current) use of anticoagulants: Secondary | ICD-10-CM | POA: Diagnosis not present

## 2016-06-27 DIAGNOSIS — Z66 Do not resuscitate: Secondary | ICD-10-CM | POA: Diagnosis not present

## 2016-06-27 DIAGNOSIS — I472 Ventricular tachycardia: Secondary | ICD-10-CM | POA: Diagnosis present

## 2016-06-27 DIAGNOSIS — E871 Hypo-osmolality and hyponatremia: Secondary | ICD-10-CM | POA: Diagnosis not present

## 2016-06-27 DIAGNOSIS — E11622 Type 2 diabetes mellitus with other skin ulcer: Secondary | ICD-10-CM | POA: Diagnosis present

## 2016-06-27 DIAGNOSIS — R601 Generalized edema: Secondary | ICD-10-CM | POA: Diagnosis present

## 2016-06-27 DIAGNOSIS — M79605 Pain in left leg: Secondary | ICD-10-CM | POA: Diagnosis present

## 2016-06-27 DIAGNOSIS — Z6830 Body mass index (BMI) 30.0-30.9, adult: Secondary | ICD-10-CM

## 2016-06-27 DIAGNOSIS — R4189 Other symptoms and signs involving cognitive functions and awareness: Secondary | ICD-10-CM | POA: Diagnosis not present

## 2016-06-27 DIAGNOSIS — L97229 Non-pressure chronic ulcer of left calf with unspecified severity: Secondary | ICD-10-CM | POA: Diagnosis present

## 2016-06-27 DIAGNOSIS — I4729 Other ventricular tachycardia: Secondary | ICD-10-CM

## 2016-06-27 DIAGNOSIS — Z820 Family history of epilepsy and other diseases of the nervous system: Secondary | ICD-10-CM

## 2016-06-27 DIAGNOSIS — J9601 Acute respiratory failure with hypoxia: Secondary | ICD-10-CM | POA: Diagnosis not present

## 2016-06-27 DIAGNOSIS — N170 Acute kidney failure with tubular necrosis: Secondary | ICD-10-CM | POA: Diagnosis not present

## 2016-06-27 DIAGNOSIS — D696 Thrombocytopenia, unspecified: Secondary | ICD-10-CM | POA: Diagnosis present

## 2016-06-27 DIAGNOSIS — Z8679 Personal history of other diseases of the circulatory system: Secondary | ICD-10-CM

## 2016-06-27 DIAGNOSIS — Z810 Family history of intellectual disabilities: Secondary | ICD-10-CM | POA: Diagnosis not present

## 2016-06-27 DIAGNOSIS — M1A9XX0 Chronic gout, unspecified, without tophus (tophi): Secondary | ICD-10-CM | POA: Diagnosis present

## 2016-06-27 DIAGNOSIS — I4510 Unspecified right bundle-branch block: Secondary | ICD-10-CM | POA: Diagnosis present

## 2016-06-27 DIAGNOSIS — R5381 Other malaise: Secondary | ICD-10-CM | POA: Diagnosis present

## 2016-06-27 DIAGNOSIS — I509 Heart failure, unspecified: Secondary | ICD-10-CM | POA: Diagnosis not present

## 2016-06-27 DIAGNOSIS — I2781 Cor pulmonale (chronic): Secondary | ICD-10-CM | POA: Diagnosis present

## 2016-06-27 DIAGNOSIS — E1129 Type 2 diabetes mellitus with other diabetic kidney complication: Secondary | ICD-10-CM | POA: Diagnosis present

## 2016-06-27 DIAGNOSIS — L89152 Pressure ulcer of sacral region, stage 2: Secondary | ICD-10-CM | POA: Diagnosis present

## 2016-06-27 DIAGNOSIS — N19 Unspecified kidney failure: Secondary | ICD-10-CM | POA: Diagnosis not present

## 2016-06-27 DIAGNOSIS — M25559 Pain in unspecified hip: Secondary | ICD-10-CM

## 2016-06-27 DIAGNOSIS — M109 Gout, unspecified: Secondary | ICD-10-CM | POA: Diagnosis present

## 2016-06-27 HISTORY — DX: Other specified postprocedural states: Z98.890

## 2016-06-27 HISTORY — DX: Personal history of colon polyps, unspecified: Z86.0100

## 2016-06-27 HISTORY — DX: Incisional hernia with obstruction, without gangrene: K43.0

## 2016-06-27 HISTORY — DX: Partial intestinal obstruction, unspecified as to cause: K56.600

## 2016-06-27 HISTORY — DX: Personal history of colonic polyps: Z86.010

## 2016-06-27 HISTORY — DX: Personal history of other diseases of the digestive system: Z87.19

## 2016-06-27 LAB — URINALYSIS, ROUTINE W REFLEX MICROSCOPIC
BILIRUBIN URINE: NEGATIVE
Glucose, UA: NEGATIVE mg/dL
Ketones, ur: NEGATIVE mg/dL
Nitrite: NEGATIVE
Protein, ur: 30 mg/dL — AB
SPECIFIC GRAVITY, URINE: 1.011 (ref 1.005–1.030)
pH: 5 (ref 5.0–8.0)

## 2016-06-27 LAB — COMPREHENSIVE METABOLIC PANEL
ALBUMIN: 2.2 g/dL — AB (ref 3.5–5.0)
ALT: 18 U/L (ref 17–63)
ANION GAP: 13 (ref 5–15)
AST: 18 U/L (ref 15–41)
Alkaline Phosphatase: 66 U/L (ref 38–126)
BILIRUBIN TOTAL: 3 mg/dL — AB (ref 0.3–1.2)
BUN: 105 mg/dL — AB (ref 6–20)
CO2: 22 mmol/L (ref 22–32)
CREATININE: 2.84 mg/dL — AB (ref 0.61–1.24)
Calcium: 8.3 mg/dL — ABNORMAL LOW (ref 8.9–10.3)
Chloride: 100 mmol/L — ABNORMAL LOW (ref 101–111)
GFR calc Af Amer: 25 mL/min — ABNORMAL LOW (ref >60)
GFR calc non Af Amer: 22 mL/min — ABNORMAL LOW (ref >60)
GLUCOSE: 89 mg/dL (ref 65–99)
POTASSIUM: 3.5 mmol/L (ref 3.5–5.1)
SODIUM: 135 mmol/L (ref 135–145)
TOTAL PROTEIN: 6.1 g/dL — AB (ref 6.5–8.1)

## 2016-06-27 LAB — TROPONIN I
TROPONIN I: 0.04 ng/mL — AB (ref <0.03)
TROPONIN I: 0.04 ng/mL — AB (ref <0.03)
Troponin I: 0.04 ng/mL (ref <0.03)
Troponin I: 0.04 ng/mL (ref <0.03)

## 2016-06-27 LAB — BRAIN NATRIURETIC PEPTIDE

## 2016-06-27 LAB — GLUCOSE, CAPILLARY
GLUCOSE-CAPILLARY: 66 mg/dL (ref 65–99)
GLUCOSE-CAPILLARY: 113 mg/dL — AB (ref 65–99)
Glucose-Capillary: 119 mg/dL — ABNORMAL HIGH (ref 65–99)
Glucose-Capillary: 144 mg/dL — ABNORMAL HIGH (ref 65–99)

## 2016-06-27 LAB — CREATININE, SERUM
CREATININE: 2.79 mg/dL — AB (ref 0.61–1.24)
GFR calc Af Amer: 26 mL/min — ABNORMAL LOW (ref >60)
GFR calc non Af Amer: 22 mL/min — ABNORMAL LOW (ref >60)

## 2016-06-27 LAB — CBC WITH DIFFERENTIAL/PLATELET
BASOS ABS: 0 10*3/uL (ref 0.0–0.1)
Basophils Relative: 0 %
EOS PCT: 2 %
Eosinophils Absolute: 0.1 10*3/uL (ref 0.0–0.7)
HCT: 35.2 % — ABNORMAL LOW (ref 39.0–52.0)
Hemoglobin: 11.5 g/dL — ABNORMAL LOW (ref 13.0–17.0)
LYMPHS PCT: 20 %
Lymphs Abs: 0.8 10*3/uL (ref 0.7–4.0)
MCH: 23.7 pg — ABNORMAL LOW (ref 26.0–34.0)
MCHC: 32.7 g/dL (ref 30.0–36.0)
MCV: 72.6 fL — AB (ref 78.0–100.0)
MONO ABS: 0.4 10*3/uL (ref 0.1–1.0)
Monocytes Relative: 10 %
Neutro Abs: 2.9 10*3/uL (ref 1.7–7.7)
Neutrophils Relative %: 69 %
PLATELETS: 68 10*3/uL — AB (ref 150–400)
RBC: 4.85 MIL/uL (ref 4.22–5.81)
RDW: 18.6 % — AB (ref 11.5–15.5)
WBC: 4.2 10*3/uL (ref 4.0–10.5)

## 2016-06-27 LAB — DIGOXIN LEVEL: DIGOXIN LVL: 0.3 ng/mL — AB (ref 0.8–2.0)

## 2016-06-27 MED ORDER — ASPIRIN 81 MG PO CHEW
324.0000 mg | CHEWABLE_TABLET | Freq: Once | ORAL | Status: AC
  Administered 2016-06-27: 324 mg via ORAL
  Filled 2016-06-27: qty 4

## 2016-06-27 MED ORDER — SODIUM CHLORIDE 0.9% FLUSH
3.0000 mL | INTRAVENOUS | Status: DC | PRN
  Administered 2016-07-11 – 2016-07-12 (×2): 3 mL via INTRAVENOUS
  Filled 2016-06-27 (×2): qty 3

## 2016-06-27 MED ORDER — SODIUM CHLORIDE 0.9% FLUSH
3.0000 mL | Freq: Two times a day (BID) | INTRAVENOUS | Status: DC
  Administered 2016-06-27 – 2016-07-13 (×22): 3 mL via INTRAVENOUS

## 2016-06-27 MED ORDER — TRAMADOL HCL 50 MG PO TABS
50.0000 mg | ORAL_TABLET | Freq: Four times a day (QID) | ORAL | Status: DC | PRN
  Administered 2016-06-27 – 2016-07-11 (×5): 50 mg via ORAL
  Filled 2016-06-27 (×5): qty 1

## 2016-06-27 MED ORDER — ALLOPURINOL 100 MG PO TABS
100.0000 mg | ORAL_TABLET | Freq: Every day | ORAL | Status: DC
  Administered 2016-06-27 – 2016-07-10 (×14): 100 mg via ORAL
  Filled 2016-06-27 (×14): qty 1

## 2016-06-27 MED ORDER — ONDANSETRON HCL 4 MG/2ML IJ SOLN
4.0000 mg | Freq: Four times a day (QID) | INTRAMUSCULAR | Status: DC | PRN
  Administered 2016-06-27 – 2016-07-01 (×4): 4 mg via INTRAVENOUS
  Filled 2016-06-27 (×3): qty 2

## 2016-06-27 MED ORDER — ACETAMINOPHEN 325 MG PO TABS
650.0000 mg | ORAL_TABLET | ORAL | Status: DC | PRN
  Administered 2016-07-11 – 2016-07-12 (×3): 650 mg via ORAL
  Filled 2016-06-27 (×3): qty 2

## 2016-06-27 MED ORDER — ALBUTEROL SULFATE (2.5 MG/3ML) 0.083% IN NEBU: 2.5000 mg | INHALATION_SOLUTION | Freq: Four times a day (QID) | RESPIRATORY_TRACT | Status: DC | PRN

## 2016-06-27 MED ORDER — FUROSEMIDE 10 MG/ML IJ SOLN
80.0000 mg | Freq: Once | INTRAMUSCULAR | Status: AC
  Administered 2016-06-27: 80 mg via INTRAVENOUS
  Filled 2016-06-27: qty 8

## 2016-06-27 MED ORDER — AMIODARONE HCL 200 MG PO TABS
200.0000 mg | ORAL_TABLET | Freq: Every day | ORAL | Status: DC
  Administered 2016-06-27 – 2016-06-30 (×4): 200 mg via ORAL
  Filled 2016-06-27 (×4): qty 1

## 2016-06-27 MED ORDER — SODIUM CHLORIDE 0.9 % IV SOLN: 250.0000 mL | INTRAVENOUS | Status: DC | PRN

## 2016-06-27 MED ORDER — DIGOXIN 125 MCG PO TABS
0.1250 mg | ORAL_TABLET | ORAL | Status: DC
  Administered 2016-06-27 – 2016-06-29 (×2): 0.125 mg via ORAL
  Filled 2016-06-27 (×3): qty 1

## 2016-06-27 MED ORDER — ISOSORBIDE DINITRATE 10 MG PO TABS
10.0000 mg | ORAL_TABLET | Freq: Three times a day (TID) | ORAL | Status: DC
  Administered 2016-06-27 (×3): 10 mg via ORAL
  Filled 2016-06-27 (×3): qty 1

## 2016-06-27 MED ORDER — COLCHICINE 0.6 MG PO TABS: 0.6000 mg | ORAL_TABLET | Freq: Two times a day (BID) | ORAL | Status: DC | PRN

## 2016-06-27 MED ORDER — GUAIFENESIN-DM 100-10 MG/5ML PO SYRP
5.0000 mL | ORAL_SOLUTION | ORAL | Status: DC | PRN
  Administered 2016-06-27: 5 mL via ORAL
  Filled 2016-06-27: qty 5

## 2016-06-27 MED ORDER — INSULIN ASPART 100 UNIT/ML ~~LOC~~ SOLN
0.0000 [IU] | Freq: Three times a day (TID) | SUBCUTANEOUS | Status: DC
  Administered 2016-06-28 – 2016-06-30 (×3): 1 [IU] via SUBCUTANEOUS
  Administered 2016-07-01: 2 [IU] via SUBCUTANEOUS
  Administered 2016-07-02 (×3): 1 [IU] via SUBCUTANEOUS
  Administered 2016-07-03: 2 [IU] via SUBCUTANEOUS
  Administered 2016-07-04 – 2016-07-05 (×3): 1 [IU] via SUBCUTANEOUS
  Administered 2016-07-05 – 2016-07-07 (×3): 2 [IU] via SUBCUTANEOUS

## 2016-06-27 MED ORDER — FUROSEMIDE 10 MG/ML IJ SOLN
80.0000 mg | Freq: Two times a day (BID) | INTRAMUSCULAR | Status: DC
  Administered 2016-06-27: 80 mg via INTRAVENOUS
  Filled 2016-06-27: qty 8

## 2016-06-27 MED ORDER — APIXABAN 5 MG PO TABS
5.0000 mg | ORAL_TABLET | Freq: Two times a day (BID) | ORAL | Status: DC
  Administered 2016-06-27 – 2016-06-29 (×5): 5 mg via ORAL
  Filled 2016-06-27 (×5): qty 1

## 2016-06-27 NOTE — Consult Note (Addendum)
[facility] Nurse wound consult note Reason for Consult: Consult requested for BLE.  Pt has cellulitis with generalized edema and erythremia, and developed blisters which have evolved into partial thickness skin loss.  He also has a chronic full thickness wound to the left posterior leg. Left posterior leg 4X4X.1cm, red and dry, no odor or drainage. Left anterior calf with dark purple-red edematous skin which may evolve into blistering and leaking but is intact at this time.  Right posterior leg with patchy areas of partial thickness wounds; 4X.1X.1cm, .5X.5X.1cm and anterior calf 2.5X.2X.1cm.  All areas with pink moist wound bed and mod amt yellow drainage, no odor. Dressing procedure/placement/frequency: Xeroform to promote drying and healing, ABD pads to absorb drainage, ace wrap to provide light compression and reduce edema.  Discussed plan of care with patient and he verbalized understanding. Please re-consult if further assistance is needed.  Thank-you,  Julien Girt MSN, [place], [place], [place], [place]

## 2016-06-27 NOTE — ED Notes (Signed)
Attempted IV x 2 unsuccessful IV team called.

## 2016-06-27 NOTE — H&P (Signed)
History and Physical    Caleb Bauer:[id number] DOB: Jun 26, 1950 DOA: 06/27/2016  PCP: [name], MD Patient coming from: home  Chief Complaint: LE edema and pain/chest pain/sob  HPI: Caleb Bauer is a 66 y.o. male with medical history significant for nonischemic dilated cardiomyopathy echo 2 years ago with an EF of 20%, COPD, diabetes, hypertension, gout, obesity presents to the emergency department with a chief complaint of gradual and persistent worsening of bilateral lower extremity edema/pain. Initial evaluation reveals acute on chronic combined heart failure, acute on chronic kidney disease.  Information is obtained from the patient and his significant other who is at the bedside. Patient reports he stopped taking torsemide approximately 2 weeks ago. During this time he noticed a gradual worsening in his lower extremity edema. He states in the last doses developed blisters that have burst and legs are "weeping". Associated symptoms include pain with ambulation intermittent chest pain, cough with small amount around sputum. Describes the pain as  "mild pressure" located left anterior nonradiating worse with movement. He reports that he did start his torsemide again yesterday. He came to the emergency department when he began having difficulty ambulating due to the pain. He reports attempting compression wraps without relief. He also reports asked weight 193. Chart review indicates current weight 227. He denies headache fever chills nausea vomiting. He denies abdominal pain dysuria hematuria frequency or urgency. He denies diarrhea constipation melena bright red blood per rectum.    ED Course: In the emergency department he's afebrile hemodynamically stable with a blood pressure at the low end of normal. He is not hypoxic. He is provided with 80 mg of Lasix intravenously and 324 mg of aspirin  Review of Systems: As per HPI otherwise 10 point review of systems negative.    Ambulatory Status: Ambulates independently with a steady gait no recent falls  Past Medical History:  Diagnosis Date  . Acute renal failure ([place]) 11/23/2012  . Arthritis   . Atrial flutter by electrocardiogram ([place]) 11/2012  . Cardiomyopathy, dilated ([place])   . CHF (congestive heart failure) ([place])   . Chronic kidney disease   . Colon polyps   . COPD (chronic obstructive pulmonary disease) ([place])   . Diabetes mellitus without complication ([place])   . Encephalopathy acute 03/14/2013  . Enteritis due to Clostridium difficile 03/12/2014  . GERD (gastroesophageal reflux disease)    appt 5/17  . Gout   . Gout flare 09/12/2013  . HCAP (healthcare-associated pneumonia) 03/24/2013   HCAP 03/09/13 >tx w/ abx  cXR 03/23/13 >improved but persistent bibasilar consolidation>cxr >   . Herpes    BUTT AND BACK- 9/23 HEALED  . Herpes zoster 04/18/2013  . Hypertension   . Sepsis due to undetermined organism with acute respiratory failure ([place]) 03/10/2013  . Sleep apnea    never used cpap but has one  . Small bowel obstruction ([place]) 10/2012  . UTI (urinary tract infection) 09/15/2013    Past Surgical History:  Procedure Laterality Date  . COLONOSCOPY WITH PROPOFOL N/A 08/06/2015   Procedure: COLONOSCOPY WITH PROPOFOL;  Surgeon: Gatha Mayer, MD;  Location: [facility];  Service: Endoscopy;  Laterality: N/A;  . HERNIA REPAIR     x 3  . INSERTION OF MESH N/A 05/30/2015   Procedure: INSERTION OF MESH;  Surgeon: Michael Boston, MD;  Location: [facility];  Service: General;  Laterality: N/A;  . LAPAROSCOPIC INCISIONAL / UMBILICAL / [place] N/A 05/30/2015  Procedure: LYSIS OF ADHESION;  Surgeon: Michael Boston, MD;  Location: [facility];  Service: General;  Laterality: N/A;  . VENTRAL HERNIA REPAIR N/A 05/30/2015   Procedure: LAPAROSCOPIC VENTRAL HERNIA REPAIR WITH MESH ;  Surgeon: Michael Boston, MD;  Location: [facility];  Service: General;  Laterality: N/A;    Social History   Social  History  . Marital status: Single    Spouse name: N/A  . Number of children: 0  . Years of education: N/A   Occupational History  . retired    Social History Main Topics  . Smoking status: Former Smoker    Packs/day: 1.00    Years: 30.00    Types: Cigarettes    Quit date: 09/28/2012  . Smokeless tobacco: Never Used  . Alcohol use No     Comment: 2015 last alcohol"  . Drug use: No  . Sexual activity: No   Other Topics Concern  . Not on file   Social History Narrative  . No narrative on file   Lives at home with his significant other is currently unemployed/on disability Allergies  Allergen Reactions  . Other Nausea And Vomiting    Patient stated drug is "kerein"? Formulation, dose, indication?    Family History  Problem Relation Age of Onset  . Diabetes Brother   . Alzheimer's disease Mother   . Cancer Neg Hx     Prior to Admission medications   Medication Sig Start Date End Date Taking? Authorizing Provider  albuterol (PROVENTIL) (2.5 MG/3ML) 0.083% nebulizer solution Take 2.5 mg by nebulization every 6 (six) hours as needed for wheezing or shortness of breath.    Historical Provider, MD  allopurinol (ZYLOPRIM) 100 MG tablet TAKE ONE TABLET BY MOUTH ONCE DAILY 09/13/14   Jolaine Artist, MD  amiodarone (PACERONE) 200 MG tablet Take 1 tablet (200 mg total) by mouth daily. 06/06/13   Jolaine Artist, MD  apixaban (ELIQUIS) 5 MG TABS tablet Take 5 mg by mouth 2 (two) times daily.    Historical Provider, MD  Caffeine-Magnesium Salicylate (DIUREX PO) Take 1 tablet by mouth daily as needed (For water weight or bloating.).     Historical Provider, MD  carvedilol (COREG) 3.125 MG tablet Take 1 tablet (3.125 mg total) by mouth 2 (two) times daily. 06/06/13   Jolaine Artist, MD  colchicine 0.6 MG tablet Take 1 tablet (0.6 mg total) by mouth 2 (two) times daily as needed (gout flair). 03/16/14   Delfina Redwood, MD  digoxin (LANOXIN) 0.25 MG tablet Take 0.125 mg by mouth  every other day.    Historical Provider, MD  hydrALAZINE (APRESOLINE) 10 MG tablet Take 10 mg by mouth 3 (three) times daily.    Historical Provider, MD  isosorbide dinitrate (ISORDIL) 10 MG tablet Take 10 mg by mouth 3 (three) times daily.    Historical Provider, MD  lisinopril (PRINIVIL,ZESTRIL) 2.5 MG tablet Take 2.5 mg by mouth daily.    Historical Provider, MD  metolazone (ZAROXOLYN) 5 MG tablet Take 5 mg by mouth daily.    Historical Provider, MD  torsemide (DEMADEX) 20 MG tablet Take 2 tablets (40 mg total) by mouth 2 (two) times a week. Every Monday and Friday, Hold for wt less than 193 lb 03/16/14   Delfina Redwood, MD  traMADol (ULTRAM) 50 MG tablet Take 1-2 tablets (50-100 mg total) by mouth every 6 (six) hours as needed for moderate pain or severe pain. 05/30/15   Michael Boston, MD  Physical Exam: Vitals:   06/27/16 0329 06/27/16 0500 06/27/16 0615 06/27/16 0655  BP: 103/68 110/63 100/71 104/65  Pulse: 68   74  Resp: 18 17 (!) 21 18  Temp:      TempSrc:      SpO2: 99%   100%  Weight:      Height:         General:  Appears calm and comfortable, pleasant in no acute distress Eyes:  PERRL, EOMI, normal lids, iris ENT:  grossly normal hearing, lips & tongue, mucous membranes of his mouth are pink slightly dry Neck:  +JVD, no masses or thyromegaly Cardiovascular:  RRR, +murmur.  2-3+LE edema bilaterally. Clear weeping right LE. Chronic vascular changes as well. Warm to touch. PP non-palpable Respiratory:   Normal respiratory effort. Fair air movement. Bilateral crackles auscultated hear no wheeze no rhonchi Abdomen:  soft,  Obese ntnd, +BS no guarding  Skin:  Ulceration anterior right shin, large ulceration posterior calf left  Musculoskeletal:  grossly normal tone BUE/BLE, good ROM, no bony abnormality Psychiatric:  grossly normal mood and affect, speech fluent and appropriate, AOx3 Neurologic:  CN 2-12 grossly intact, moves all extremities in coordinated fashion, sensation  intact  Labs on Admission: I have personally reviewed following labs and imaging studies  CBC:  Recent Labs Lab 06/27/16 0348  WBC 4.2  NEUTROABS 2.9  HGB 11.5*  HCT 35.2*  MCV 72.6*  PLT 68*   Basic Metabolic Panel:  Recent Labs Lab 06/27/16 0348  NA 135  K 3.5  CL 100*  CO2 22  GLUCOSE 89  BUN 105*  CREATININE 2.84*  CALCIUM 8.3*   GFR: Estimated Creatinine Clearance: 33.2 mL/min (A) (by C-G formula based on SCr of 2.84 mg/dL (H)). Liver Function Tests:  Recent Labs Lab 06/27/16 0348  AST 18  ALT 18  ALKPHOS 66  BILITOT 3.0*  PROT 6.1*  ALBUMIN 2.2*   No results for input(s): LIPASE, AMYLASE in the last 168 hours. No results for input(s): AMMONIA in the last 168 hours. Coagulation Profile: No results for input(s): INR, PROTIME in the last 168 hours. Cardiac Enzymes:  Recent Labs Lab 06/27/16 0348  TROPONINI 0.04*   BNP (last 3 results) No results for input(s): PROBNP in the last 8760 hours. HbA1C: No results for input(s): HGBA1C in the last 72 hours. CBG: No results for input(s): GLUCAP in the last 168 hours. Lipid Profile: No results for input(s): CHOL, HDL, LDLCALC, TRIG, CHOLHDL, LDLDIRECT in the last 72 hours. Thyroid Function Tests: No results for input(s): TSH, T4TOTAL, FREET4, T3FREE, THYROIDAB in the last 72 hours. Anemia Panel: No results for input(s): VITAMINB12, FOLATE, FERRITIN, TIBC, IRON, RETICCTPCT in the last 72 hours. Urine analysis:    Component Value Date/Time   COLORURINE RED (A) 03/10/2014 1843   APPEARANCEUR TURBID (A) 03/10/2014 1843   LABSPEC 1.019 03/10/2014 1843   PHURINE 5.0 03/10/2014 1843   GLUCOSEU NEGATIVE 03/10/2014 1843   HGBUR LARGE (A) 03/10/2014 1843   BILIRUBINUR MODERATE (A) 03/10/2014 1843   KETONESUR 15 (A) 03/10/2014 1843   PROTEINUR >300 (A) 03/10/2014 1843   UROBILINOGEN 1.0 03/10/2014 1843   NITRITE POSITIVE (A) 03/10/2014 1843   LEUKOCYTESUR MODERATE (A) 03/10/2014 1843    Creatinine  Clearance: Estimated Creatinine Clearance: 33.2 mL/min (A) (by C-G formula based on SCr of 2.84 mg/dL (H)).  Sepsis Labs: @LABRCNTIP (procalcitonin:4,lacticidven:4) )No results found for this or any previous visit (from the past 240 hour(s)).   Radiological Exams on Admission: Dg Chest 2  View  Result Date: 06/27/2016 CLINICAL DATA:  66 year old male with history of CHF, diabetes, and hypertension presenting with dyspnea EXAM: CHEST  2 VIEW COMPARISON:  Chest radiograph dated 07/24/2014 FINDINGS: There is moderate cardiomegaly with central vascular prominence compatible with congestive changes. There is no focal consolidation, pleural effusion, or pneumothorax. No acute osseous pathology. IMPRESSION: Moderate cardiomegaly with vascular congestion. No focal consolidation. Electronically Signed   By: Anner Crete M.D.   On: 06/27/2016 04:21    EKG: Independently reviewed. Sinus rhythm Prolonged PR interval Left atrial enlargement Right bundle branch block Abnormal T, consider ischemia, lateral leads no significant change compared to May 2016  Assessment/Plan Principal Problem:   Acute on chronic combined systolic (congestive) and diastolic (congestive) heart failure (HCC) Active Problems:   Hypertensive heart disease   Diabetes mellitus without complication (HCC)   COPD (chronic obstructive pulmonary disease) (HCC)   Atrial flutter by electrocardiogram (HCC)   GERD (gastroesophageal reflux disease)   Iron deficiency anemia   Acute renal failure superimposed on stage 3 chronic kidney disease (HCC)   Chronic gouty arthritis   Sleep apnea   Chest pain   Bilateral leg edema   #1. Acute on chronic combined heart failure likely related to noncompliance with medication. BNP greater than 4500, chest x-ray with moderate cardiomegaly and vascular congestion, bilateral lower extremity edema. History of same. Last echo noted 2016 with an EF of 15-20%. He is provided with 80 mg of Lasix IV in the  emergency department. Home medications include amiodarone, digoxin, lisinopril, torsemide, Zaroxolyn carvedilol. Blood Pressure is somewhat soft in the emergency department -Admit to telemetry -Continue Lasix IV twice a day -Monitor intake and output -Obtain daily weights -Obtain 2-D echo -Continue amiodarone and digoxin -Holding lisinopril, Coreg, Zaroxolyn and Demadex -Monitor blood pressure closely while receiving IV diuretics -Obtain a digital  #2. Chest pain. Likely related to above. Initial troponin 0.04. Chart review indicates history of elevated troponin likely secondary to chronic kidney disease. Patient is pain-free on admission. Chest x-ray as noted above. EKG sinus rhythm with right bundle branch block. He received 324 mg of aspirin in the emergency department -Cycle troponin -Serial EKG -Oxygen as indicated  #3. Acute on chronic kidney disease stage 3. Likely related to above. Chart review indicates history of cardiorenal syndrome. Creatinine on admission 2.8. Chart review indicates baseline 1.68. -Hold nephrotoxins as able -Monitor urine output  -Monitor renal function closely given the need for IV Lasix  4. Bilateral lower extremity edema with skin breakdown. Likely related to above. -wound consult  #5. A flutter. EKG as noted above. chadvasc score 4. Home meds include eliquis -continue eliquis -monitor  #6. Diabetes type 2. Serum glucose on admission 96. Diet controlled. -Carb modified diet -Monitor CBGs -Sliding scale for optimal control  #7. Hypertension. Blood pressure somewhat soft in the emergency department. Home medications include amiodarone, Coreg, digoxin, hydralazine,isordil, lisinopril. -Continuing amiodarone and digitoxin and isosorbide dinitrate.  -monitor closely given need for IV lasix -resume home meds as indicated  #8. GERD. Stable at baseline  #9. COPD, appears stable at baseline. Not on home oxygen. Stopped smoking in 2014. Chest x-ray as  noted above. Oxygen saturation level greater than 90% on room air at rest -Nebs as needed  #10 IDA. Likely related to chronic disease. Hg greater than 11 on admission. Signs symptoms of active bleeding -Monitor  #11. Chronic gouty arthritis. Stable at baseline -Continue home meds   DVT prophylaxis: eliquis  Code Status: full  Family Communication: niece  at bedside  Disposition Plan: home when ready  Consults called: none  Admission status: inpatient    Radene Gunning MD [facility]  If 7PM-7AM, please contact night-coverage [email or web address] Password [facility]  06/27/2016, 7:34 AM

## 2016-06-27 NOTE — Evaluation (Signed)
Physical Therapy Evaluation Patient Details Name: Caleb Bauer MRN: [id number] DOB: 08-06-1950 Today's Date: 06/27/2016   History of Present Illness  Patient is a 66 yo male admitted 06/27/16 with SOB and LE edema/cellulitis.  Patient with CHF, acute on CKD, and LE wounds/blisters.     PMH:  BLE cellulitis to blisters and skin loss, Lt posterior leg wound, COPD, DM, HTN, CHF, NICM, EF 20%, obesity, CKD  Clinical Impression  Patient presents with problems listed below.  Will benefit from acute PT to maximize functional independence prior to return home.  Patient limited by pain in LE's.  As pain decreases, patient's mobility/gait should improve.  Do not feel patient will need any f/u PT at discharge.    Follow Up Recommendations No PT follow up;Supervision - Intermittent    Equipment Recommendations  None recommended by PT    Recommendations for Other Services       Precautions / Restrictions Precautions Precautions: Fall Restrictions Weight Bearing Restrictions: No Other Position/Activity Restrictions: Keep LE's elevated for edema reduction      Mobility  Bed Mobility Overal bed mobility: Needs Assistance Bed Mobility: Supine to Sit;Sit to Supine     Supine to sit: Min guard Sit to supine: Mod assist   General bed mobility comments: Required assist to bring LE's onto bed to return to supine.  Transfers Overall transfer level: Needs assistance Equipment used: Rolling walker (2 wheeled) Transfers: Sit to/from Stand Sit to Stand: Min guard         General transfer comment: Verbal cues for hand placement.  Patient using rocking and momentum to move to standing on third attempt.  Assist for balance/safety.  Ambulation/Gait Ambulation/Gait assistance: Min guard Ambulation Distance (Feet): 24 Feet Assistive device: Rolling walker (2 wheeled) Gait Pattern/deviations: Step-through pattern;Decreased step length - right;Decreased step length - left;Decreased stride  length;Shuffle;Antalgic;Trunk flexed Gait velocity: decreased Gait velocity interpretation: Below normal speed for age/gender General Gait Details: Patient with very slow gait due to pain in LE's with weightbearing.  No loss of balance with use of RW.  Stairs            Wheelchair Mobility    Modified Rankin (Stroke Patients Only)       Balance Overall balance assessment: Needs assistance Sitting-balance support: No upper extremity supported;Feet supported Sitting balance-Leahy Scale: Good     Standing balance support: Bilateral upper extremity supported Standing balance-Leahy Scale: Poor                               Pertinent Vitals/Pain Pain Assessment: 0-10 Pain Score: 8  Pain Location: BLE's Pain Descriptors / Indicators: Aching;Sore Pain Intervention(s): Limited activity within patient's tolerance;Monitored during session;Repositioned    Home Living Family/patient expects to be discharged to:: Assisted living Living Arrangements: Alone   Type of Home: Assisted living Home Access: Level entry       Home Equipment: Walker - 2 wheels;Cane - quad;Shower seat      Prior Function Level of Independence: Independent         Comments: Does not drive.  Uses cabs and bus for transport.     Hand Dominance        Extremity/Trunk Assessment   Upper Extremity Assessment Upper Extremity Assessment: Overall WFL for tasks assessed    Lower Extremity Assessment Lower Extremity Assessment: Generalized weakness (Lower legs with compression wrap)       Communication   Communication: No difficulties  Cognition  Arousal/Alertness: Awake/alert Behavior During Therapy: WFL for tasks assessed/performed Overall Cognitive Status: Within Functional Limits for tasks assessed                                        General Comments General comments (skin integrity, edema, etc.): Wounds on BLE's.  Compression bandages on lower legs.     Exercises     Assessment/Plan    PT Assessment Patient needs continued PT services  PT Problem List Decreased strength;Decreased activity tolerance;Decreased balance;Decreased mobility;Decreased knowledge of use of DME;Cardiopulmonary status limiting activity;Obesity;Decreased skin integrity;Pain       PT Treatment Interventions DME instruction;Gait training;Functional mobility training;Therapeutic activities;Therapeutic exercise;Patient/family education    PT Goals (Current goals can be found in the Care Plan section)  Acute Rehab PT Goals Patient Stated Goal: To decrease pain. To return home. PT Goal Formulation: With patient Time For Goal Achievement: 07/04/16 Potential to Achieve Goals: Good    Frequency Min 3X/week   Barriers to discharge        Co-evaluation               End of Session Equipment Utilized During Treatment: Gait belt Activity Tolerance: Patient limited by pain Patient left: in bed;with call bell/phone within reach;with bed alarm set Nurse Communication: Mobility status PT Visit Diagnosis: Unsteadiness on feet (R26.81);Other abnormalities of gait and mobility (R26.89);Muscle weakness (generalized) (M62.81);Pain Pain - Right/Left:  (Bilateral) Pain - part of body: Leg    Time: 8466-5993 PT Time Calculation (min) (ACUTE ONLY): 23 min   Charges:   PT Evaluation $PT Eval Moderate Complexity: 1 Procedure PT Treatments $Gait Training: 8-22 mins   PT G Codes:        Carita Pian. Sanjuana Kava, [facility] Acute Rehab Services Pager [place] 06/27/2016, 9:43 PM

## 2016-06-27 NOTE — Care Management Note (Addendum)
Case Management Note  Patient Details  Name: Caleb Bauer MRN: [id number] Date of Birth: 10-Feb-1951  Subjective/Objective:      Admitted with CHF             Action/Plan: Patient lives at home alone PCP: [name], MD; has private insurance with Medicare/ Medicaid with prescription drug coverage; pharmacy of choice is [facility], his medication is delivered to his home; CM talked to patient about his compliancy with his medication; patient stated " I took a break but I will not do that again."He has scales at home and knows to weigh himself daily; cooks a heart healthy diet with no salt. For transportation he use the bus or a cab; Patient is very pleasant without any complaints with very colorful fingernails; awaiting on PT eval for disposition needs  Expected Discharge Date:  06/30/16               Expected Discharge Plan:  [place] possibly  Discharge planning Services  CM Consult  Status of Service:  In process, will continue to follow  Sherrilyn Rist [phone] 06/27/2016, 10:05 AM

## 2016-06-27 NOTE — ED Provider Notes (Signed)
[facility] DEPT Provider Note   CSN: [id number] Arrival date & time: 06/27/16  0241  By signing my name below, I, Oleh Genin, attest that this documentation has been prepared under the direction and in the presence of Sherwood Gambler, MD. Electronically Signed: Oleh Genin, Scribe. 06/27/16. 3:41 AM.   History   Chief Complaint Chief Complaint  Patient presents with  . Cellulitis    left leg treated/ needs right leg looked at     HPI Caleb Bauer is a 66 y.o. male with history of CHF, COPD, and HTN who presents to the ED for evaluation of leg pain. This patient states that he has had chronic issues with bilateral lower extremity edema. He has been prescribed torsemide to control; but he states that he has stopped taking his medication for several weeks. In the last 4 days he has noticed significant increase in his edema bilaterally with weeping. He has restarted his torsemide in response. He presents tonight because he was having difficulty ambulating due to pain. He has attempted compression wraps at home without relief. Current weight is 227lbs  Reportedly (supposed to be at 193). He is otherwise reporting 2 weeks of cough with "brown" sputum as well as dyspnea. He states these complaints are worsening. No chest pain. Dyspnea is worse with laying flat. No fevers.  The history is provided by the patient. No language interpreter was used.    Past Medical History:  Diagnosis Date  . Acute renal failure ([place]) 11/23/2012  . Arthritis   . Atrial flutter by electrocardiogram ([place]) 11/2012  . 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 CHF (congestive heart failure) ([place])   . Chronic kidney disease   . Colon polyps   . COPD (chronic obstructive pulmonary disease) ([place])   . Diabetes mellitus without complication ([place])   . Encephalopathy acute 03/14/2013  . Enteritis due to Clostridium difficile 03/12/2014  . GERD (gastroesophageal reflux disease)    appt 5/17  . Gout   . Gout flare  09/12/2013  . HCAP (healthcare-associated pneumonia) 03/24/2013   HCAP 03/09/13 >tx w/ abx  cXR 03/23/13 >improved but persistent bibasilar consolidation>cxr >   . Herpes    BUTT AND BACK- 9/23 HEALED  . Herpes zoster 04/18/2013  . Hypertension   . Sepsis due to undetermined organism with acute respiratory failure ([place]) 03/10/2013  . Sleep apnea    never used cpap but has one  . Small bowel obstruction ([place]) 10/2012  . UTI (urinary tract infection) 09/15/2013    Patient Active Problem List   Diagnosis Date Noted  . Hx of colonic polyps   . Recurrent ventral hernia with incarceration s/p lap repair with mesh 05/30/2015 05/30/2015  . Partial small bowel obstruction s/p lap adhesiolysis 05/30/2015 05/30/2015  . Incarcerated incisional hernia 05/30/2015  . S/P laparoscopic hernia repair 05/30/2015  . Sleep apnea   . Anasarca 07/26/2014  . Chronic gouty arthritis 12/21/2013  . Renal failure (ARF), acute on chronic (HCC) 09/12/2013  . Acute gouty arthritis 07/12/2013  . Cardiorenal syndrome with renal failure 04/09/2013  . Acute combined systolic and diastolic CHF, NYHA class 2 ([place]) 04/07/2013  . Hypokalemia 04/07/2013  . Acute respiratory failure ([place]) 03/10/2013  . Gout   . Iron deficiency anemia 12/14/2012  . GERD (gastroesophageal reflux disease)   . Atrial flutter by electrocardiogram ([place])   . Physical deconditioning 11/30/2012  . COPD (chronic obstructive pulmonary disease) ([place])   . History of ventricular tachycardia 09/17/2012  . Abnormal transaminases 09/17/2012  .  Hypertensive heart disease   . Diabetes mellitus without complication [facility])     Past Surgical History:  Procedure Laterality Date  . COLONOSCOPY WITH PROPOFOL N/A 08/06/2015   Procedure: COLONOSCOPY WITH PROPOFOL;  Surgeon: Gatha Mayer, MD;  Location: [facility];  Service: Endoscopy;  Laterality: N/A;  . HERNIA REPAIR     x 3  . INSERTION OF MESH N/A 05/30/2015   Procedure: INSERTION OF MESH;  Surgeon: Michael Boston, MD;  Location: [facility];  Service: General;  Laterality: N/A;  . LAPAROSCOPIC INCISIONAL / UMBILICAL / [place] N/A 05/30/2015   Procedure: LYSIS OF ADHESION;  Surgeon: Michael Boston, MD;  Location: [facility];  Service: General;  Laterality: N/A;  . VENTRAL HERNIA REPAIR N/A 05/30/2015   Procedure: LAPAROSCOPIC VENTRAL HERNIA REPAIR WITH MESH ;  Surgeon: Michael Boston, MD;  Location: [facility];  Service: General;  Laterality: N/A;       Home Medications    Prior to Admission medications   Medication Sig Start Date End Date Taking? Authorizing Provider  albuterol (PROVENTIL) (2.5 MG/3ML) 0.083% nebulizer solution Take 2.5 mg by nebulization every 6 (six) hours as needed for wheezing or shortness of breath.    Historical Provider, MD  allopurinol (ZYLOPRIM) 100 MG tablet TAKE ONE TABLET BY MOUTH ONCE DAILY 09/13/14   Jolaine Artist, MD  amiodarone (PACERONE) 200 MG tablet Take 1 tablet (200 mg total) by mouth daily. 06/06/13   Jolaine Artist, MD  apixaban (ELIQUIS) 5 MG TABS tablet Take 5 mg by mouth 2 (two) times daily.    Historical Provider, MD  Caffeine-Magnesium Salicylate (DIUREX PO) Take 1 tablet by mouth daily as needed (For water weight or bloating.).     Historical Provider, MD  carvedilol (COREG) 3.125 MG tablet Take 1 tablet (3.125 mg total) by mouth 2 (two) times daily. 06/06/13   Jolaine Artist, MD  colchicine 0.6 MG tablet Take 1 tablet (0.6 mg total) by mouth 2 (two) times daily as needed (gout flair). 03/16/14   Delfina Redwood, MD  digoxin (LANOXIN) 0.25 MG tablet Take 0.125 mg by mouth every other day.    Historical Provider, MD  hydrALAZINE (APRESOLINE) 10 MG tablet Take 10 mg by mouth 3 (three) times daily.    Historical Provider, MD  isosorbide dinitrate (ISORDIL) 10 MG tablet Take 10 mg by mouth 3 (three) times daily.    Historical Provider, MD  lisinopril (PRINIVIL,ZESTRIL) 2.5 MG tablet Take 2.5 mg by mouth daily.    Historical  Provider, MD  metolazone (ZAROXOLYN) 5 MG tablet Take 5 mg by mouth daily.    Historical Provider, MD  torsemide (DEMADEX) 20 MG tablet Take 2 tablets (40 mg total) by mouth 2 (two) times a week. Every Monday and Friday, Hold for wt less than 193 lb 03/16/14   Delfina Redwood, MD  traMADol (ULTRAM) 50 MG tablet Take 1-2 tablets (50-100 mg total) by mouth every 6 (six) hours as needed for moderate pain or severe pain. 05/30/15   Michael Boston, MD    Family History Family History  Problem Relation Age of Onset  . Diabetes Brother   . Alzheimer's disease Mother   . Cancer Neg Hx     Social History Social History  Substance Use Topics  . Smoking status: Former Smoker    Packs/day: 1.00    Years: 30.00    Types: Cigarettes    Quit date: 09/28/2012  .  Smokeless tobacco: Never Used  . Alcohol use No     Comment: 2015 last alcohol"     Allergies   Other   Review of Systems Review of Systems  Respiratory: Positive for cough and shortness of breath.   Cardiovascular: Positive for leg swelling. Negative for chest pain.  All other systems reviewed and are negative.    Physical Exam Updated Vital Signs BP 103/68 (BP Location: Right Arm)   Pulse 68   Temp 97.5 F (36.4 C) (Oral)   Resp 18   Ht 6\' 2"  (1.88 m)   Wt 227 lb (103 kg)   SpO2 99%   BMI 29.15 kg/m   Physical Exam  Constitutional: He is oriented to person, place, and time. He appears well-developed and well-nourished.  HENT:  Head: Normocephalic and atraumatic.  Right Ear: External ear normal.  Left Ear: External ear normal.  Nose: Nose normal.  Eyes: Right eye exhibits no discharge. Left eye exhibits no discharge.  Neck: Neck supple. JVD present.  Cardiovascular: Normal rate, regular rhythm and normal heart sounds.   Pulmonary/Chest: Effort normal. He has rales.  Mild bibasilar rales.   Abdominal: Soft. There is no tenderness.  Musculoskeletal: He exhibits edema (pitting edema from feet to knees  bilaterally).  Clear weeping in bilateral lower exrmities. Mild bilateral erythema of the lower legs but no warmth or purulent drainage.  Neurological: He is alert and oriented to person, place, and time.  Skin: Skin is warm and dry.  Nursing note and vitals reviewed.    ED Treatments / Results  Labs (all labs ordered are listed, but only abnormal results are displayed) Labs Reviewed  COMPREHENSIVE METABOLIC PANEL - Abnormal; Notable for the following:       Result Value   Chloride 100 (*)    BUN 105 (*)    Creatinine, Ser 2.84 (*)    Calcium 8.3 (*)    Total Protein 6.1 (*)    Albumin 2.2 (*)    Total Bilirubin 3.0 (*)    GFR calc non Af Amer 22 (*)    GFR calc Af Amer 25 (*)    All other components within normal limits  BRAIN NATRIURETIC PEPTIDE - Abnormal; Notable for the following:    B Natriuretic Peptide >4,500.0 (*)    All other components within normal limits  CBC WITH DIFFERENTIAL/PLATELET - Abnormal; Notable for the following:    Hemoglobin 11.5 (*)    HCT 35.2 (*)    MCV 72.6 (*)    MCH 23.7 (*)    RDW 18.6 (*)    Platelets 68 (*)    All other components within normal limits  TROPONIN I - Abnormal; Notable for the following:    Troponin I 0.04 (*)    All other components within normal limits  DIGOXIN LEVEL - Abnormal; Notable for the following:    Digoxin Level <0.2 (*)    All other components within normal limits    EKG  EKG Interpretation  Date/Time:  Friday June 27 2016 03:48:46 EDT Ventricular Rate:  69 PR Interval:    QRS Duration: 169 QT Interval:  494 QTC Calculation: 530 R Axis:   -72 Text Interpretation:  Sinus rhythm Prolonged PR interval Left atrial enlargement Right bundle branch block Abnormal T, consider ischemia, lateral leads no significant change compared to May 2016 Confirmed by Regenia Skeeter MD, Lorenna Lurry [phone]) on 06/27/2016 3:59:02 AM       Radiology Dg Chest 2 View  Result Date: 06/27/2016 CLINICAL  DATA:  66 year old male with  history of CHF, diabetes, and hypertension presenting with dyspnea EXAM: CHEST  2 VIEW COMPARISON:  Chest radiograph dated 07/24/2014 FINDINGS: There is moderate cardiomegaly with central vascular prominence compatible with congestive changes. There is no focal consolidation, pleural effusion, or pneumothorax. No acute osseous pathology. IMPRESSION: Moderate cardiomegaly with vascular congestion. No focal consolidation. Electronically Signed   By: Anner Crete M.D.   On: 06/27/2016 04:21    Procedures Procedures (including critical care time)  Medications Ordered in ED Medications  aspirin chewable tablet 324 mg (not administered)  furosemide (LASIX) injection 80 mg (not administered)     Initial Impression / Assessment and Plan / ED Course  I have reviewed the triage vital signs and the nursing notes.  Pertinent labs & imaging results that were available during my care of the patient were reviewed by me and considered in my medical decision making (see chart for details).     Patient is not in distress. Presentation is c/w significant fluid overload. Mild JVD, likely mild pulmonary edema. Given significant difficulty ambulating despite being on torsemide, will need IV diuretics. Given IV lasix in ED. D/w Dr. Tamala Julian, hospitalist service to admit  Final Clinical Impressions(s) / ED Diagnoses   Final diagnoses:  Acute congestive heart failure, unspecified heart failure type [facility])    New Prescriptions New Prescriptions   No medications on file   I personally performed the services described in this documentation, which was scribed in my presence. The recorded information has been reviewed and is accurate.    Sherwood Gambler, MD 06/27/16 [phone]

## 2016-06-27 NOTE — ED Notes (Signed)
Patient c/o pain in both legs states they have been swelling for 2 weeks .

## 2016-06-27 NOTE — Progress Notes (Signed)
Received signout from Dr. Regenia Skeeter Caleb Bauer  66 y/o M with pmh CHF, COPD, HTN; who presents with bilateral leg swelling under recently taking himself off of furosemide for several weeks. Patient reportedly 30 lbs over dry weight. BNP>4500, BUN 105,  Cr 2.84(previous baseline Cr 1.68). Given 80 mg of Lasix IV. BPs have been relatively soft 100/71. No bed requested at this time. Admitting for CHF exacerbation.

## 2016-06-27 NOTE — Progress Notes (Signed)
Patient's glucose registered 66 upon admission. Pt provided apple juice and menu to order breakfast.

## 2016-06-27 NOTE — ED Triage Notes (Signed)
Patient here from home for right lower leg pain related to swelling and weeping in the lower legs.  Patient was seen and had left leg looked at, states "I need someone to look at this right leg".  Patient had history of CHF and has +1 edema bilateral legs with good pedal pulses.

## 2016-06-28 ENCOUNTER — Inpatient Hospital Stay (HOSPITAL_COMMUNITY): Payer: Medicare Other

## 2016-06-28 ENCOUNTER — Other Ambulatory Visit (HOSPITAL_COMMUNITY): Payer: Medicare Other

## 2016-06-28 DIAGNOSIS — R079 Chest pain, unspecified: Secondary | ICD-10-CM

## 2016-06-28 DIAGNOSIS — L8990 Pressure ulcer of unspecified site, unspecified stage: Secondary | ICD-10-CM | POA: Diagnosis present

## 2016-06-28 LAB — GLUCOSE, CAPILLARY
GLUCOSE-CAPILLARY: 135 mg/dL — AB (ref 65–99)
GLUCOSE-CAPILLARY: 113 mg/dL — AB (ref 65–99)
Glucose-Capillary: 92 mg/dL (ref 65–99)
Glucose-Capillary: 90 mg/dL (ref 65–99)

## 2016-06-28 LAB — ECHOCARDIOGRAM COMPLETE
HEIGHTINCHES: 72 in
Weight: 3612.8 oz

## 2016-06-28 LAB — BASIC METABOLIC PANEL
ANION GAP: 11 (ref 5–15)
BUN: 105 mg/dL — ABNORMAL HIGH (ref 6–20)
CALCIUM: 8.2 mg/dL — AB (ref 8.9–10.3)
CO2: 25 mmol/L (ref 22–32)
Chloride: 100 mmol/L — ABNORMAL LOW (ref 101–111)
Creatinine, Ser: 3.16 mg/dL — ABNORMAL HIGH (ref 0.61–1.24)
GFR, EST AFRICAN AMERICAN: 22 mL/min — AB (ref >60)
GFR, EST NON AFRICAN AMERICAN: 19 mL/min — AB (ref >60)
Glucose, Bld: 134 mg/dL — ABNORMAL HIGH (ref 65–99)
POTASSIUM: 3.5 mmol/L (ref 3.5–5.1)
Sodium: 136 mmol/L (ref 135–145)

## 2016-06-28 LAB — HEMOGLOBIN A1C
Hgb A1c MFr Bld: 6.2 % — ABNORMAL HIGH (ref 4.8–5.6)
Mean Plasma Glucose: 131 mg/dL

## 2016-06-28 LAB — SODIUM, URINE, RANDOM: SODIUM UR: 15 mmol/L

## 2016-06-28 LAB — CREATININE, URINE, RANDOM: Creatinine, Urine: 128.94 mg/dL

## 2016-06-28 LAB — OSMOLALITY: OSMOLALITY: 316 mosm/kg — AB (ref 275–295)

## 2016-06-28 LAB — OSMOLALITY, URINE: OSMOLALITY UR: 336 mosm/kg (ref 300–900)

## 2016-06-28 MED ORDER — ALBUTEROL SULFATE (2.5 MG/3ML) 0.083% IN NEBU
3.0000 mL | INHALATION_SOLUTION | Freq: Two times a day (BID) | RESPIRATORY_TRACT | Status: DC
  Administered 2016-06-28 – 2016-07-03 (×11): 3 mL via RESPIRATORY_TRACT
  Filled 2016-06-28 (×11): qty 3

## 2016-06-28 MED ORDER — ALBUTEROL SULFATE (2.5 MG/3ML) 0.083% IN NEBU
3.0000 mL | INHALATION_SOLUTION | Freq: Four times a day (QID) | RESPIRATORY_TRACT | Status: DC
  Administered 2016-06-28: 3 mL via RESPIRATORY_TRACT
  Filled 2016-06-28: qty 3

## 2016-06-28 MED ORDER — BISACODYL 10 MG RE SUPP: 10.0000 mg | Freq: Every day | RECTAL | Status: DC | PRN

## 2016-06-28 MED ORDER — GUAIFENESIN ER 600 MG PO TB12
600.0000 mg | ORAL_TABLET | Freq: Two times a day (BID) | ORAL | Status: DC
  Administered 2016-06-28 – 2016-07-14 (×33): 600 mg via ORAL
  Filled 2016-06-28 (×33): qty 1

## 2016-06-28 MED ORDER — FUROSEMIDE 10 MG/ML IJ SOLN
40.0000 mg | Freq: Two times a day (BID) | INTRAMUSCULAR | Status: DC
  Administered 2016-06-28 – 2016-06-29 (×3): 40 mg via INTRAVENOUS
  Filled 2016-06-28 (×3): qty 4

## 2016-06-28 MED ORDER — POLYETHYLENE GLYCOL 3350 17 G PO PACK
17.0000 g | PACK | Freq: Every day | ORAL | Status: DC
  Administered 2016-06-28 – 2016-07-14 (×16): 17 g via ORAL
  Filled 2016-06-28 (×18): qty 1

## 2016-06-28 NOTE — Progress Notes (Signed)
[facility] Progress Note  Patient: Caleb Bauer EUM:[id number]   PCP: [name], MD DOB: 1950-06-18   DOA: 06/27/2016   DOS: 06/28/2016   Date of Service: the patient was seen and examined on 06/28/2016  Subjective: Feeling better, continues to have shortness of breath as well as swelling of the legs. No diarrhea. Was not taking his Lasix prior to admission. He mentions he has seen his kidney doctors recently.  Brief hospital course: Pt. with PMH of nonischemic cardiac myopathy, systolic CHF, COPD, type II DM, HTN, obesity; admitted on 06/27/2016, with complaint of shortness of breath and lower leg edema, was found to have acute on chronic combined CHF. Currently further plan is continue IV diuresis.  Assessment and Plan: 1. Acute on chronic combined systolic (congestive) and diastolic (congestive) heart failure (HCC) Continue IV Lasix at present, dose reduced from 60 mg twice a day to 40 mg twice a day. Follow-up an echo cardiogram. Strict ins and outs and daily weight. Continue amiodarone and digoxin. Due to soft blood pressure beta blockers are on hold. Due to his renal dysfunction ACE inhibitors and ARB's are contraindicated,. Continue to monitor on telemetry. Will require outpatient follow-up with his cardiology on discharge.  2. Acute on chronic kidney disease.  Baseline stage III BUN and serum creatinine significantly elevated. We'll check US renal as well as urine electrolytes as well as osmolality. May require nephrology consult if renal function worsens further. Currently diuresing, dose reduced due to worsening renal dysfunction. Patient has a follow-up with his nephrologist in June. Avoid nephrotoxic medications at present. ACE inhibitors and ARB are contraindicated.  3. Chronic, thrmbocytopenia. Platelet counts have been running low since March 2017. No active evidence of bleeding. Monitor at present.  4. Bilateral ulcers and erythema. Chronic  full-thickness wound on the left posterior leg. Would get consulted, appreciate input. Xeroform, ABD as well as Ace wrap provided. Monitor.  5. Atrial flutter. chadvasc score 4 Currently rate controlled, patient on liquids at home, with renal dysfunction unsure whether this can be continued further. Discussed with the patient he may need to be switched to Coumadin due to his renal dysfunction. We'll monitor at present.  6. Type 2 diabetes mellitus. A1c 6.2. Blood sugars well controlled. We'll monitor. Continue current regimen.  7. History of COPD. Occasional upper airway wheezing, no evidence of active exacerbation. Continue when necessary nebulizers. Scheduled Mucinex as well as flutter device.  8. Iron deficiency anemia. Low MCV. We'll monitor. Hemoglobin stable.  Bowel regimen: last BM 06/27/2016 Diet: cardiac and carb modified diet DVT Prophylaxis: Apixaban  Advance goals of care discussion: full code  Family Communication: no family was present at bedside, at the time of interview.  Disposition:  Discharge to home. Expected discharge date: 06/30/2016,   Consultants: none Procedures: Echocardiogram  Antibiotics: Anti-infectives    None       Objective: Physical Exam: Vitals:   06/27/16 1949 06/28/16 0045 06/28/16 0640 06/28/16 0939  BP: 99/65 102/75 100/72 108/74  Pulse:  68 69 67  Resp:  18 20 20   Temp:  97.5 F (36.4 C) 97.4 F (36.3 C) 98 F (36.7 C)  TempSrc:  Oral Oral Oral  SpO2:  97% 98% 98%  Weight:   102.4 kg (225 lb 12.8 oz)   Height:        Intake/Output Summary (Last 24 hours) at 06/28/16 1037 Last data filed at 06/28/16 0939  Gross per 24 hour  Intake  1740 ml  Output              100 ml  Net             1640 ml   Filed Weights   06/27/16 0242 06/27/16 0840 06/28/16 0640  Weight: 103 kg (227 lb) 100.9 kg (222 lb 6.4 oz) 102.4 kg (225 lb 12.8 oz)   General: Alert, Awake and Oriented to Time, Place and Person. Appear  in mild distress, affect appropriate Eyes: PERRL, Conjunctiva normal ENT: Oral Mucosa clear moist. Neck: difficult to assess JVD, no Abnormal Mass Or lumps Cardiovascular: S1 and S2 Present, aortic systolic Murmur, Respiratory: Bilateral Air entry equal and Decreased, no use of accessory muscle, bilateral Crackles, Occasional upper airway wheezes Abdomen: Bowel Sound present, Soft and no tenderness Skin: no redness, no Rash, no induration Extremities: bilateral Pedal edema, no calf tenderness Neurologic: Grossly no focal neuro deficit. Bilaterally Equal motor strength  Data Reviewed: CBC:  Recent Labs Lab 06/27/16 0348  WBC 4.2  NEUTROABS 2.9  HGB 11.5*  HCT 35.2*  MCV 72.6*  PLT 68*   Basic Metabolic Panel:  Recent Labs Lab 06/27/16 0348 06/27/16 0708 06/28/16 0249  NA 135  --  136  K 3.5  --  3.5  CL 100*  --  100*  CO2 22  --  25  GLUCOSE 89  --  134*  BUN 105*  --  105*  CREATININE 2.84* 2.79* 3.16*  CALCIUM 8.3*  --  8.2*    Liver Function Tests:  Recent Labs Lab 06/27/16 0348  AST 18  ALT 18  ALKPHOS 66  BILITOT 3.0*  PROT 6.1*  ALBUMIN 2.2*   No results for input(s): LIPASE, AMYLASE in the last 168 hours. No results for input(s): AMMONIA in the last 168 hours. Coagulation Profile: No results for input(s): INR, PROTIME in the last 168 hours. Cardiac Enzymes:  Recent Labs Lab 06/27/16 0348 06/27/16 0708 06/27/16 1557 06/27/16 1843  TROPONINI 0.04* 0.04* 0.04* 0.04*   BNP (last 3 results) No results for input(s): PROBNP in the last 8760 hours. CBG:  Recent Labs Lab 06/27/16 0836 06/27/16 1108 06/27/16 1702 06/27/16 2141 06/28/16 0734  GLUCAP 66 113* 119* 144* 92   Studies: No results found.  Scheduled Meds: . albuterol  3 mL Inhalation QID  . allopurinol  100 mg Oral Daily  . amiodarone  200 mg Oral Daily  . apixaban  5 mg Oral BID  . digoxin  0.125 mg Oral QODAY  . furosemide  40 mg Intravenous Q12H  . guaiFENesin  600 mg  Oral BID  . insulin aspart  0-9 Units Subcutaneous TID WC  . polyethylene glycol  17 g Oral Daily  . sodium chloride flush  3 mL Intravenous Q12H   Continuous Infusions: . sodium chloride     PRN Meds: sodium chloride, acetaminophen, albuterol, bisacodyl, guaiFENesin-dextromethorphan, ondansetron (ZOFRAN) IV, sodium chloride flush, traMADol  Time spent: 35 minutes  Author: Berle Mull, MD [facility] Pager: [phone] 06/28/2016 10:37 AM  If 7PM-7AM, please contact night-coverage at [email or web address], password [facility]

## 2016-06-28 NOTE — Progress Notes (Addendum)
ANTICOAGULATION CONSULT NOTE - Follow Up Consult  Pharmacy Consult for Eliquis Indication: atrial fibrillation  Allergies  Allergen Reactions  . Other Nausea And Vomiting    Patient stated drug is "kerein"? Formulation, dose, indication?    Patient Measurements: Height: 6' (182.9 cm) (per pt previously documented 6' 2: was incorrect) Weight: 225 lb 12.8 oz (102.4 kg) IBW/kg (Calculated) : 77.6  Vital Signs: Temp: 97.4 F (36.3 C) (04/28 0640) Temp Source: Oral (04/28 0640) BP: 100/72 (04/28 0640) Pulse Rate: 69 (04/28 0640)  Labs:  Recent Labs  06/27/16 0348 06/27/16 0708 06/27/16 1557 06/27/16 1843 06/28/16 0249  HGB 11.5*  --   --   --   --   HCT 35.2*  --   --   --   --   PLT 68*  --   --   --   --   CREATININE 2.84* 2.79*  --   --  3.16*  TROPONINI 0.04* 0.04* 0.04* 0.04*  --     Estimated Creatinine Clearance: 28.8 mL/min (A) (by C-G formula based on SCr of 3.16 mg/dL (H)).   Medications:  Scheduled:  . allopurinol  100 mg Oral Daily  . amiodarone  200 mg Oral Daily  . apixaban  5 mg Oral BID  . digoxin  0.125 mg Oral QODAY  . furosemide  40 mg Intravenous Q12H  . insulin aspart  0-9 Units Subcutaneous TID WC  . polyethylene glycol  17 g Oral Daily  . sodium chloride flush  3 mL Intravenous Q12H   Infusions:  . sodium chloride      Assessment: 66 yo M presenting on 06/27/2016 with acute on chronic HFrEF (15-20%) with h/o atrial fibrillation/flutter on Eliquis PTA. Restarted Eliquis appropriately at 5 mg BID (age <80, wt >60). Will need to monitor renal function closely since continues to trend up. LFTs wnl. Hgb low but stable, plt low at 68 (?chronic - last year also low at 96). No bleeding issues reported.   Goal of Therapy:  Monitor platelets by anticoagulation protocol: Yes   Plan:  - Continue Eliquis 5 mg PO BID   - May need to consider alternative anticoagulation if SCr does not trend back down to baseline since Eliquis not studied in patients  with SCr > 2.5 or CrCl < 25 ml/min  - Continue to monitor CBC at least q 72 hrs and for s/s bleeding     Aspen Deterding K. Velva Harman, PharmD, BCPS, CPP Clinical Pharmacist Pager: [phone] Phone: [phone] 06/28/2016 8:23 AM

## 2016-06-28 NOTE — Progress Notes (Signed)
*   Echocardiogram 2D Echocardiogram has been performed.  Matilde Bash 06/28/2016, 9:16 AM

## 2016-06-29 LAB — BASIC METABOLIC PANEL
ANION GAP: 14 (ref 5–15)
BUN: 108 mg/dL — ABNORMAL HIGH (ref 6–20)
CALCIUM: 8.6 mg/dL — AB (ref 8.9–10.3)
CO2: 22 mmol/L (ref 22–32)
CREATININE: 3.46 mg/dL — AB (ref 0.61–1.24)
Chloride: 96 mmol/L — ABNORMAL LOW (ref 101–111)
GFR, EST AFRICAN AMERICAN: 20 mL/min — AB (ref >60)
GFR, EST NON AFRICAN AMERICAN: 17 mL/min — AB (ref >60)
GLUCOSE: 85 mg/dL (ref 65–99)
Potassium: 4.2 mmol/L (ref 3.5–5.1)
Sodium: 132 mmol/L — ABNORMAL LOW (ref 135–145)

## 2016-06-29 LAB — CBC WITH DIFFERENTIAL/PLATELET
Basophils Absolute: 0 10*3/uL (ref 0.0–0.1)
Basophils Relative: 0 %
EOS ABS: 0.1 10*3/uL (ref 0.0–0.7)
Eosinophils Relative: 1 %
HEMATOCRIT: 36.8 % — AB (ref 39.0–52.0)
HEMOGLOBIN: 11.8 g/dL — AB (ref 13.0–17.0)
LYMPHS ABS: 0.7 10*3/uL (ref 0.7–4.0)
LYMPHS PCT: 14 %
MCH: 23.7 pg — AB (ref 26.0–34.0)
MCHC: 32.1 g/dL (ref 30.0–36.0)
MCV: 74 fL — AB (ref 78.0–100.0)
Monocytes Absolute: 0.3 10*3/uL (ref 0.1–1.0)
Monocytes Relative: 6 %
NEUTROS ABS: 4.2 10*3/uL (ref 1.7–7.7)
NEUTROS PCT: 79 %
Platelets: 63 10*3/uL — ABNORMAL LOW (ref 150–400)
RBC: 4.97 MIL/uL (ref 4.22–5.81)
RDW: 19 % — ABNORMAL HIGH (ref 11.5–15.5)
WBC: 5.3 10*3/uL (ref 4.0–10.5)

## 2016-06-29 LAB — IRON AND TIBC
Iron: 40 ug/dL — ABNORMAL LOW (ref 45–182)
SATURATION RATIOS: 18 % (ref 17.9–39.5)
TIBC: 218 ug/dL — ABNORMAL LOW (ref 250–450)
UIBC: 178 ug/dL

## 2016-06-29 LAB — VITAMIN B12: Vitamin B-12: 789 pg/mL (ref 180–914)

## 2016-06-29 LAB — GLUCOSE, CAPILLARY
GLUCOSE-CAPILLARY: 139 mg/dL — AB (ref 65–99)
Glucose-Capillary: 80 mg/dL (ref 65–99)
Glucose-Capillary: 132 mg/dL — ABNORMAL HIGH (ref 65–99)
Glucose-Capillary: 83 mg/dL (ref 65–99)

## 2016-06-29 LAB — FERRITIN: Ferritin: 217 ng/mL (ref 24–336)

## 2016-06-29 LAB — PROTIME-INR
INR: 3.2
PROTHROMBIN TIME: 33.5 s — AB (ref 11.4–15.2)

## 2016-06-29 LAB — CBC
HCT: 37.4 % — ABNORMAL LOW (ref 39.0–52.0)
HEMOGLOBIN: 12 g/dL — AB (ref 13.0–17.0)
MCH: 23.6 pg — AB (ref 26.0–34.0)
MCHC: 32.1 g/dL (ref 30.0–36.0)
MCV: 73.6 fL — ABNORMAL LOW (ref 78.0–100.0)
PLATELETS: 63 10*3/uL — AB (ref 150–400)
RBC: 5.08 MIL/uL (ref 4.22–5.81)
RDW: 19.1 % — ABNORMAL HIGH (ref 11.5–15.5)
WBC: 6.3 10*3/uL (ref 4.0–10.5)

## 2016-06-29 LAB — TYPE AND SCREEN
ABO/RH(D): O POS
ANTIBODY SCREEN: NEGATIVE

## 2016-06-29 LAB — ABO/RH: ABO/RH(D): O POS

## 2016-06-29 LAB — APTT: aPTT: 49 seconds — ABNORMAL HIGH (ref 24–36)

## 2016-06-29 LAB — FIBRINOGEN: Fibrinogen: 351 mg/dL (ref 210–475)

## 2016-06-29 LAB — MRSA PCR SCREENING: MRSA BY PCR: NEGATIVE

## 2016-06-29 LAB — TSH

## 2016-06-29 LAB — UREA NITROGEN, URINE: UREA NITROGEN UR: 460 mg/dL

## 2016-06-29 MED ORDER — HEPARIN (PORCINE) IN NACL 100-0.45 UNIT/ML-% IJ SOLN
1300.0000 [IU]/h | INTRAMUSCULAR | Status: DC
  Administered 2016-06-29: 1400 [IU]/h via INTRAVENOUS
  Administered 2016-06-30 – 2016-07-01 (×2): 1300 [IU]/h via INTRAVENOUS
  Filled 2016-06-29 (×3): qty 250

## 2016-06-29 MED ORDER — MILRINONE LACTATE IN DEXTROSE 20-5 MG/100ML-% IV SOLN
0.2500 ug/kg/min | INTRAVENOUS | Status: DC
  Administered 2016-06-29 – 2016-06-30 (×2): 0.125 ug/kg/min via INTRAVENOUS
  Administered 2016-07-01: 0.25 ug/kg/min via INTRAVENOUS
  Filled 2016-06-29 (×3): qty 100

## 2016-06-29 MED ORDER — METOLAZONE 5 MG PO TABS
5.0000 mg | ORAL_TABLET | Freq: Every day | ORAL | Status: DC
  Administered 2016-06-29: 5 mg via ORAL
  Filled 2016-06-29: qty 1

## 2016-06-29 MED ORDER — TORSEMIDE 20 MG PO TABS
20.0000 mg | ORAL_TABLET | Freq: Two times a day (BID) | ORAL | Status: DC
  Administered 2016-06-29: 20 mg via ORAL
  Filled 2016-06-29: qty 1

## 2016-06-29 MED ORDER — FUROSEMIDE 10 MG/ML IJ SOLN
80.0000 mg | Freq: Three times a day (TID) | INTRAMUSCULAR | Status: DC
  Administered 2016-06-29 – 2016-06-30 (×2): 80 mg via INTRAVENOUS
  Filled 2016-06-29: qty 8

## 2016-06-29 MED ORDER — FUROSEMIDE 10 MG/ML IJ SOLN
INTRAMUSCULAR | Status: AC
  Filled 2016-06-29: qty 8

## 2016-06-29 NOTE — Progress Notes (Signed)
Dr Einar Gip paged about pt.  NSR 1st degree HB BBB, burst tachy 140-150 hr then Vent [place].  Does not substain.but having often.    Pt asymptomatic.  bp 107/75, Pt feels like needs to void. Voided 125.  Bladder scan obtained with 80 noted.  Will continue to monitor Saunders Revel T

## 2016-06-29 NOTE — Progress Notes (Signed)
ANTICOAGULATION CONSULT NOTE - Follow Up Consult  Pharmacy Consult for Heparin Indication: atrial fibrillation  Allergies  Allergen Reactions  . Other Nausea And Vomiting    Patient stated drug is "kerein"? Formulation, dose, indication?    Patient Measurements: Height: 6' (182.9 cm) (per pt previously documented 6' 2: was incorrect) Weight: 226 lb 8 oz (102.7 kg) (scale a) IBW/kg (Calculated) : 77.6 Heparin Dosing Weight: 98.7kg  Vital Signs: Temp: 98.3 F (36.8 C) (04/29 1200) Temp Source: Oral (04/29 1200) BP: 101/77 (04/29 1300) Pulse Rate: 76 (04/29 1200)  Labs:  Recent Labs  06/27/16 0348 06/27/16 0708 06/27/16 1557 06/27/16 1843 06/28/16 0249 06/29/16 0630 06/29/16 1037  HGB 11.5*  --   --   --   --  12.0* 11.8*  HCT 35.2*  --   --   --   --  37.4* 36.8*  PLT 68*  --   --   --   --  63* 63*  APTT  --   --   --   --   --   --  49*  LABPROT  --   --   --   --   --   --  33.5*  INR  --   --   --   --   --   --  3.20  CREATININE 2.84* 2.79*  --   --  3.16* 3.46*  --   TROPONINI 0.04* 0.04* 0.04* 0.04*  --   --   --     Estimated Creatinine Clearance: 26.4 mL/min (A) (by C-G formula based on SCr of 3.46 mg/dL (H)).  Assessment: 65yom on eliquis for afib, now to switch to IV heparin in setting of worsening renal function. Last eliquis dose today at 0919. Thrombocytopenia noted with platelets 63. Will use aPTTs to monitor heparin initially as eliquis falsely elevates heparin levels.    Goal of Therapy:  APTT 66-102 seconds Heparin level 0.3-0.7 units/ml Monitor platelets by anticoagulation protocol: Yes   Plan:  1) At 2100 tonight, begin heparin at 1400 units/hr 2) 8 hour aPTT and heparin level 3) Daily aPTT, heparin level, CBC  Deboraha Sprang 06/29/2016,2:00 PM

## 2016-06-29 NOTE — Progress Notes (Signed)
Daily dressing change to BLE Complete.

## 2016-06-29 NOTE — Progress Notes (Signed)
D/W pt need for PICC, reasoning, risks and benefits.  Consent signed.  Pt then stated hx of CKD and followed by Nephrology with discussion of dialysis.  No nephrologist in hx(previously checked).  Dr Einar Gip notified of need for nephrology consult and approval.  1.88" 20 G PIV obtained.  Will await approval for PICC placement.  Tracey RN and pt aware.

## 2016-06-29 NOTE — Consult Note (Addendum)
CARDIOLOGY CONSULT NOTE  Patient ID: Caleb Bauer MRN: [id number] DOB/AGE: June 08, 1950 66 y.o.  Admit date: 06/27/2016 Referring Physician  Berle Mull, MD Primary Physician:  [name], MD Reason for Consultation  CHF  HPI: [name]  is a 66 y.o. male  With History of nonischemic myopathy, due to alcoholic cardiomyopathy. He was last admitted almost 2 years ago. I see him in the office on a frequent basis to avoid hospitalization and acute decompensation of systolic heart failure.  Patient has history of bipolar disorder, COPD from prior tobacco use, hypertension, stage III chronic kidney disease, diabetes mellitus controlled with vascular complication, paroxysmal atrial flutter, chronic cor pulmonale, history of NSVT and not a candidate for ICD implantation. He discontinued taking all his medications about a week ago, started gradually noticing worsening dyspnea, increasing leg edema and not feeling well. Except for dyspnea, patient states that he is doing well and denies any chest pain.  Past Medical History:  Diagnosis Date  . Acute renal failure ([place]) 11/23/2012  . Arthritis   . Atrial flutter by electrocardiogram ([place]) 11/2012  . Cardiomyopathy, dilated ([place])   . CHF (congestive heart failure) ([place])   . Chronic kidney disease   . Colon polyps   . COPD (chronic obstructive pulmonary disease) ([place])   . Diabetes mellitus without complication ([place])   . Encephalopathy acute 03/14/2013  . Enteritis due to Clostridium difficile 03/12/2014  . GERD (gastroesophageal reflux disease)    appt 5/17  . Gout   . Gout flare 09/12/2013  . HCAP (healthcare-associated pneumonia) 03/24/2013   HCAP 03/09/13 >tx w/ abx  cXR 03/23/13 >improved but persistent bibasilar consolidation>cxr >   . Herpes    BUTT AND BACK- 9/23 HEALED  . Herpes zoster 04/18/2013  . Hypertension   . Sepsis due to undetermined organism with acute respiratory failure ([place]) 03/10/2013  . Sleep apnea    never used  cpap but has one  . Small bowel obstruction ([place]) 10/2012  . UTI (urinary tract infection) 09/15/2013     Past Surgical History:  Procedure Laterality Date  . COLONOSCOPY WITH PROPOFOL N/A 08/06/2015   Procedure: COLONOSCOPY WITH PROPOFOL;  Surgeon: Gatha Mayer, MD;  Location: [facility];  Service: Endoscopy;  Laterality: N/A;  . HERNIA REPAIR     x 3  . INSERTION OF MESH N/A 05/30/2015   Procedure: INSERTION OF MESH;  Surgeon: Michael Boston, MD;  Location: [facility];  Service: General;  Laterality: N/A;  . LAPAROSCOPIC INCISIONAL / UMBILICAL / [place] N/A 05/30/2015   Procedure: LYSIS OF ADHESION;  Surgeon: Michael Boston, MD;  Location: [facility];  Service: General;  Laterality: N/A;  . VENTRAL HERNIA REPAIR N/A 05/30/2015   Procedure: LAPAROSCOPIC VENTRAL HERNIA REPAIR WITH MESH ;  Surgeon: Michael Boston, MD;  Location: [facility];  Service: General;  Laterality: N/A;     Family History  Problem Relation Age of Onset  . Diabetes Brother   . Alzheimer's disease Mother   . Cancer Neg Hx      Social History: Social History   Social History  . Marital status: Single    Spouse name: N/A  . Number of children: 0  . Years of education: N/A   Occupational History  . retired    Social History Main Topics  . Smoking status: Former Smoker    Packs/day: 1.00    Years: 30.00    Types: Cigarettes  Quit date: 09/28/2012  . Smokeless tobacco: Never Used  . Alcohol use No     Comment: 2015 last alcohol"  . Drug use: No  . Sexual activity: No   Other Topics Concern  . Not on file   Social History Narrative  . No narrative on file     Prescriptions Prior to Admission  Medication Sig Dispense Refill Last Dose  . albuterol (PROVENTIL) (2.5 MG/3ML) 0.083% nebulizer solution Take 2.5 mg by nebulization every 6 (six) hours as needed for wheezing or shortness of breath.   unk  . allopurinol (ZYLOPRIM) 100 MG tablet TAKE ONE TABLET BY MOUTH ONCE DAILY 30  tablet 0 Past Week at Unknown time  . amiodarone (PACERONE) 200 MG tablet Take 1 tablet (200 mg total) by mouth daily. 90 tablet 3 Past Week at Unknown time  . apixaban (ELIQUIS) 5 MG TABS tablet Take 5 mg by mouth 2 (two) times daily.   06/26/2016 at 220  . Caffeine-Magnesium Salicylate (DIUREX PO) Take 1 tablet by mouth daily as needed (For water weight or bloating.).    unk  . carvedilol (COREG) 3.125 MG tablet Take 1 tablet (3.125 mg total) by mouth 2 (two) times daily. 180 tablet 3 06/26/2016 at 2200  . colchicine 0.6 MG tablet Take 1 tablet (0.6 mg total) by mouth 2 (two) times daily as needed (gout flair). 30 tablet 0 unk  . digoxin (LANOXIN) 0.25 MG tablet Take 0.125 mg by mouth every other day.   Past Week at Unknown time  . hydrALAZINE (APRESOLINE) 10 MG tablet Take 10 mg by mouth 3 (three) times daily.   06/26/2016 at Unknown time  . isosorbide dinitrate (ISORDIL) 10 MG tablet Take 10 mg by mouth 3 (three) times daily.   06/26/2016 at Unknown time  . lisinopril (PRINIVIL,ZESTRIL) 2.5 MG tablet Take 2.5 mg by mouth daily.   Past Week at Unknown time  . metolazone (ZAROXOLYN) 5 MG tablet Take 5 mg by mouth daily.   Past Week at Unknown time  . torsemide (DEMADEX) 20 MG tablet Take 2 tablets (40 mg total) by mouth 2 (two) times a week. Every Monday and Friday, Hold for wt less than 193 lb 180 tablet 0 Past Week at Unknown time  . traMADol (ULTRAM) 50 MG tablet Take 1-2 tablets (50-100 mg total) by mouth every 6 (six) hours as needed for moderate pain or severe pain. 40 tablet 0 unk   Review of Systems - General ROS: positive for  - malaise and weight gain Hematological and Lymphatic ROS: negative for - bleeding problems, blood clots or jaundice Respiratory ROS: positive for - cough, orthopnea and wheezing Cardiovascular ROS: negative for - chest pain or palpitations Gastrointestinal ROS: no abdominal pain, change in bowel habits, or black or bloody stools negative for - abdominal pain, change  in bowel habits or melena    Physical Exam: Blood pressure 107/76, pulse 76, temperature 98.3 F (36.8 C), temperature source Oral, resp. rate 20, height 6' (1.829 m), weight 102.7 kg (226 lb 8 oz), SpO2 100 %.   General appearance: alert, cooperative, appears stated age, no distress and mildly obese Lungs: rales bibasilar and rhonchi bibasilar and bilaterally Heart: S1, S2 normal, S3 present and no rub Abdomen: abnormal findings:  ascites, hepatomegaly, liver edge palpable 1 cm below costal margin and Hepato Jugular reflux present Extremities: edema 3 plus and Full range of movements. Pulses: 2+ and symmetric Neurologic: Grossly normal  Labs:  Lab Results  Component Value Date  WBC 5.3 06/29/2016   HGB 11.8 (L) 06/29/2016   HCT 36.8 (L) 06/29/2016   MCV 74.0 (L) 06/29/2016   PLT 63 (L) 06/29/2016    Recent Labs Lab 06/27/16 0348  06/29/16 0630  NA 135  < > 132*  K 3.5  < > 4.2  CL 100*  < > 96*  CO2 22  < > 22  BUN 105*  < > 108*  CREATININE 2.84*  < > 3.46*  CALCIUM 8.3*  < > 8.6*  PROT 6.1*  --   --   BILITOT 3.0*  --   --   ALKPHOS 66  --   --   ALT 18  --   --   AST 18  --   --   GLUCOSE 89  < > 85  < > = values in this interval not displayed.  BNP (last 3 results)  Recent Labs  06/27/16 0349  BNP >4,500.0*    HEMOGLOBIN A1C Lab Results  Component Value Date   HGBA1C 6.2 (H) 06/27/2016   MPG 131 06/27/2016    Cardiac Panel (last 3 results)  Recent Labs  06/27/16 0708 06/27/16 1557 06/27/16 1843  TROPONINI 0.04* 0.04* 0.04*    Lab Results  Component Value Date   CKTOTAL 706 (H) 09/12/2013   TROPONINI 0.04 (HH) 06/27/2016     TSH  Recent Labs  06/29/16 1037  TSH <0.010*     Radiology: US Renal  Result Date: 06/28/2016 CLINICAL DATA:  Acute kidney injury EXAM: RENAL / URINARY TRACT ULTRASOUND COMPLETE COMPARISON:  03/11/2014 FINDINGS: Right Kidney: Length: 12 cm. Increased renal cortical echogenicity. No hydronephrosis  visualized. 2.2 x 2.2 x 2.1 cm anechoic right interpolar renal mass most consistent with a cyst. Left Kidney: Length: 11.8 cm. Increased renal cortical echogenicity. No hydronephrosis visualized. 2.5 x 2.2 x 2.1 cm anechoic left lower pole renal mass most consistent with a cyst. Bladder: Appears normal for degree of bladder distention. Soft tissue: Small left pleural effusion.  Abdominal ascites. IMPRESSION: 1. Bilateral increased renal cortical echogenicity as can be seen with medical renal disease. 2. Bilateral renal cysts. Electronically Signed   By: Kathreen Devoid   On: 06/28/2016 11:36    Scheduled Meds: . albuterol  3 mL Inhalation BID  . allopurinol  100 mg Oral Daily  . amiodarone  200 mg Oral Daily  . digoxin  0.125 mg Oral QODAY  . guaiFENesin  600 mg Oral BID  . insulin aspart  0-9 Units Subcutaneous TID WC  . polyethylene glycol  17 g Oral Daily  . sodium chloride flush  3 mL Intravenous Q12H   Continuous Infusions: . sodium chloride    . milrinone     PRN Meds:.sodium chloride, acetaminophen, albuterol, bisacodyl, guaiFENesin-dextromethorphan, ondansetron (ZOFRAN) IV, sodium chloride flush, traMADol  CARDIAC STUDIES:  EKG: EKG 06/28/2016: Sinus rhythm with first-degree AV block at a rate of 70 bpm, biatrial enlargement, nonspecific IVCD, atypical right bundle branch block. Normal QT interval..  Echo: Echocardiogram 06/28/2016: Dilated left ventricle with severe LV systolic dysfunction, 75% EF. Grade 2 diastolic dysfunction. Moderate to severe mitral regurgitation. Moderate left renal enlargement. Moderate right ventricular enlargement. Moderate to severe TR, Mild pulmonary hypertension, PA pressure 37 mmHg.  ASSESSMENT AND PLAN:  1. Acute on chronic systolic and diastolic heart failure involving both right ventricle and left ventricle due to noncompliance with medications. 2. 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 3. Chronic cor pulmonale secondary to COPD,  obesity. 4. Multiple valvular disease  involving moderate to severe MR and TR secondary to cardiomyopathy and acute decompensated heart failure. 5. Acute on chronic kidney disease, secondary to acute decompensated heart failure. Serum creatinine baseline is around 1.6-1.8. 6. Chronic thrombocytopenia, stable 7. Diabetes mellitus type 2 controlled on insulin 8. P. Afib/flutter CHA2DS2-VASCScore: Risk Score  4,  Yearly risk of stroke  4. Recommendation: ASA NO/Anticoagulation Yes   Recommendation: Patient has maintained sinus rhythm.  He was previously on small dose of the [place] and he had done well with this. Hence restart 0.125 mg daily. Patient previously was also on hydralazine and isosorbide dinitrate, and was tolerating this well. Patient also had responded well to metolazone and torsemide and small dose of carvedilol.  Patient needs to be started on Primacor and if he does indeed develop hypotension, he may need dopamine for support. I will have a PICC line placed, restart amiodarone. He is on Eliquis for paroxysmal atrial fibrillation,  Due to history of noncompliance, multiple medical comorbidities, presence of RV failure, he is not a candidate for by the ICD or by IV pacemaker implantation. I'll continue to follow him closely. We will hold off on starting beta blockers for now. Will maintain strict I's and O's. I will restart hydralazine and isosorbide dinitrate, BiDil was not approved by his insurance. Restart Zaroxolyn and Demadex, continue amiodarone and digoxin. I suspect he probably will respond to [facility]. Will continue to monitor his renal function.  Adrian Prows, MD 06/29/2016, 1:02 PM [facility]. [place] Pager: [phone] Office: [phone] If no answer Cell [phone]

## 2016-06-29 NOTE — Progress Notes (Signed)
[facility] Progress Note  Patient: Caleb Bauer YPP:[id number]   PCP: [name], MD DOB: May 04, 1950   DOA: 06/27/2016   DOS: 06/29/2016   Date of Service: the patient was seen and examined on 06/29/2016  Subjective: Denies any acute complaint, no change in shortness of breath as well as leg swelling. No diarrhea.  Brief hospital course: Pt. with PMH of nonischemic cardiac myopathy, systolic CHF, COPD, type II DM, HTN, obesity; admitted on 06/27/2016, with complaint of shortness of breath and lower leg edema, was found to have acute on chronic combined CHF. Currently further plan is continue IV diuresis transferred to step down and started on Primacor.  Assessment and Plan: 1. Acute on chronic combined systolic (congestive) and diastolic (congestive) heart failure (HCC) Echocardiogram shows 20% EF unchanged from prior. Renal function worsened with diuresis, cardiology consulted, started the patient on Primacor transferred to step down. Now on torsemide as well as Zaroxolyn. We'll monitor. Strict ins and outs and daily weight. Continue amiodarone and digoxin. Due to soft blood pressure beta blockers are on hold. Due to his renal dysfunction ACE inhibitors and ARB's are contraindicated,. Continue to monitor on telemetry.  2. Acute on chronic kidney disease. Baseline stage III BUN and serum creatinine significantly elevated. Chronic medical disease as per US renal as well as normal spinal artery. FE urea suggest prerenal etiology. May require nephrology consult if renal function worsens further. Currently diuresing Patient has a follow-up with his nephrologist in June. Avoid nephrotoxic medications at present. ACE inhibitors and ARB are contraindicated. Due to his renal function I'm changing his antiarrhythmic relation to heparin  3. Chronic, thrmbocytopenia. Platelet counts have been running low since March 2017. No active evidence of bleeding. Monitor at  present. With progressive worsening discussed with hematology, appears to be Reactive, or concerns and to. We will get lower extremity Doppler to rule out DVT. Extensive workup sent out for further etiology of thrombocytopenia. We'll monitor. Differential does not mention schistocytes.  4. Bilateral ulcers and erythema. Chronic full-thickness wound on the left posterior leg. Would get consulted, appreciate input. Xeroform, ABD as well as Ace wrap provided. Monitor.  5. Atrial flutter. chadvasc score 4 Currently rate controlled, patient on liquids at home, with renal dysfunction unsure whether this can be continued further. Discussed with the patient he may need to be switched to Coumadin due to his renal dysfunction. Currently on heparin instead of Eliquis. We'll monitor at present.  6. Type 2 diabetes mellitus. A1c 6.2. Blood sugars well controlled. We'll monitor. Continue current regimen.  7. History of COPD. Occasional upper airway wheezing, no evidence of active exacerbation. Continue when necessary nebulizers. Scheduled Mucinex as well as flutter device.  8. Iron deficiency anemia. Low MCV. We'll monitor. Hemoglobin stable.  Bowel regimen: last BM 06/27/2016 Diet: cardiac and carb modified diet DVT Prophylaxis: Eliquis changed to heparin.  Advance goals of care discussion: full code  Family Communication: no family was present at bedside, at the time of interview.  Disposition:  Discharge to home. Expected discharge date: 06/30/2016,   Consultants: none Procedures: Echocardiogram  Antibiotics: Anti-infectives    None       Objective: Physical Exam: Vitals:   06/29/16 1645 06/29/16 1700 06/29/16 1800 06/29/16 1830  BP: 107/65 105/73 93/77 115/81  Pulse: 64 86 73 73  Resp: 20 15 16 18   Temp:      TempSrc:      SpO2: 90% (!) 77% (!) 88% 90%  Weight:  Height:        Intake/Output Summary (Last 24 hours) at 06/29/16 1901 Last data filed at  06/29/16 1800  Gross per 24 hour  Intake          1093.85 ml  Output              476 ml  Net           617.85 ml   Filed Weights   06/27/16 0840 06/28/16 0640 06/29/16 0355  Weight: 100.9 kg (222 lb 6.4 oz) 102.4 kg (225 lb 12.8 oz) 102.7 kg (226 lb 8 oz)   General: Alert, Awake and Oriented to Time, Place and Person. Appear in mild distress, affect appropriate Eyes: PERRL, Conjunctiva normal ENT: Oral Mucosa clear moist. Neck: difficult to assess JVD, no Abnormal Mass Or lumps Cardiovascular: S1 and S2 Present, aortic systolic Murmur, Respiratory: Bilateral Air entry equal and Decreased, no use of accessory muscle, bilateral Crackles, Occasional upper airway wheezes Abdomen: Bowel Sound present, Soft and no tenderness Skin: no redness, no Rash, no induration Extremities: bilateral Pedal edema, no calf tenderness Neurologic: Grossly no focal neuro deficit. Bilaterally Equal motor strength  Data Reviewed: CBC:  Recent Labs Lab 06/27/16 0348 06/29/16 0630 06/29/16 1037  WBC 4.2 6.3 5.3  NEUTROABS 2.9  --  4.2  HGB 11.5* 12.0* 11.8*  HCT 35.2* 37.4* 36.8*  MCV 72.6* 73.6* 74.0*  PLT 68* 63* 63*   Basic Metabolic Panel:  Recent Labs Lab 06/27/16 0348 06/27/16 0708 06/28/16 0249 06/29/16 0630  NA 135  --  136 132*  K 3.5  --  3.5 4.2  CL 100*  --  100* 96*  CO2 22  --  25 22  GLUCOSE 89  --  134* 85  BUN 105*  --  105* 108*  CREATININE 2.84* 2.79* 3.16* 3.46*  CALCIUM 8.3*  --  8.2* 8.6*    Liver Function Tests:  Recent Labs Lab 06/27/16 0348  AST 18  ALT 18  ALKPHOS 66  BILITOT 3.0*  PROT 6.1*  ALBUMIN 2.2*   No results for input(s): LIPASE, AMYLASE in the last 168 hours. No results for input(s): AMMONIA in the last 168 hours. Coagulation Profile:  Recent Labs Lab 06/29/16 1037  INR 3.20   Cardiac Enzymes:  Recent Labs Lab 06/27/16 0348 06/27/16 0708 06/27/16 1557 06/27/16 1843  TROPONINI 0.04* 0.04* 0.04* 0.04*   BNP (last 3  results) No results for input(s): PROBNP in the last 8760 hours. CBG:  Recent Labs Lab 06/28/16 1653 06/28/16 2052 06/29/16 0728 06/29/16 1135 06/29/16 1607  GLUCAP 90 113* 80 132* 83   Studies: No results found.  Scheduled Meds: . albuterol  3 mL Inhalation BID  . allopurinol  100 mg Oral Daily  . amiodarone  200 mg Oral Daily  . digoxin  0.125 mg Oral QODAY  . guaiFENesin  600 mg Oral BID  . insulin aspart  0-9 Units Subcutaneous TID WC  . metolazone  5 mg Oral Daily  . polyethylene glycol  17 g Oral Daily  . sodium chloride flush  3 mL Intravenous Q12H  . torsemide  20 mg Oral BID   Continuous Infusions: . sodium chloride    . heparin    . milrinone 0.125 mcg/kg/min (06/29/16 1427)   PRN Meds: sodium chloride, acetaminophen, albuterol, bisacodyl, guaiFENesin-dextromethorphan, ondansetron (ZOFRAN) IV, sodium chloride flush, traMADol  Time spent: 35 minutes  Author: Berle Mull, MD [facility] Pager: [phone] 06/29/2016 7:01 PM  If 7PM-7AM,  please contact night-coverage at [email or web address], password [facility]

## 2016-06-29 NOTE — Progress Notes (Signed)
Pharmacy consulted to initiate milrinone for inotropic support. With continued decline in renal function will dose cautiously at 0.125 mcg/kg/min. Will need to monitor closely for hypotension, tachycardia/arrhythmias.   Ruta Hinds. Velva Harman, PharmD, BCPS, CPP Clinical Pharmacist Pager: [phone] Phone: [phone] 06/29/2016 11:33 AM

## 2016-06-30 ENCOUNTER — Inpatient Hospital Stay (HOSPITAL_COMMUNITY): Payer: Medicare Other

## 2016-06-30 DIAGNOSIS — T462X5A Adverse effect of other antidysrhythmic drugs, initial encounter: Secondary | ICD-10-CM

## 2016-06-30 DIAGNOSIS — E064 Drug-induced thyroiditis: Secondary | ICD-10-CM | POA: Diagnosis present

## 2016-06-30 DIAGNOSIS — M79609 Pain in unspecified limb: Secondary | ICD-10-CM

## 2016-06-30 DIAGNOSIS — I472 Ventricular tachycardia: Secondary | ICD-10-CM

## 2016-06-30 DIAGNOSIS — R609 Edema, unspecified: Secondary | ICD-10-CM

## 2016-06-30 DIAGNOSIS — I4729 Other ventricular tachycardia: Secondary | ICD-10-CM

## 2016-06-30 DIAGNOSIS — N179 Acute kidney failure, unspecified: Secondary | ICD-10-CM | POA: Diagnosis present

## 2016-06-30 DIAGNOSIS — E0580 Other thyrotoxicosis without thyrotoxic crisis or storm: Secondary | ICD-10-CM

## 2016-06-30 LAB — COMPREHENSIVE METABOLIC PANEL
ALBUMIN: 1.9 g/dL — AB (ref 3.5–5.0)
ALK PHOS: 78 U/L (ref 38–126)
ALT: 14 U/L — AB (ref 17–63)
ANION GAP: 10 (ref 5–15)
AST: 15 U/L (ref 15–41)
BUN: 113 mg/dL — AB (ref 6–20)
CALCIUM: 8.2 mg/dL — AB (ref 8.9–10.3)
CO2: 27 mmol/L (ref 22–32)
Chloride: 100 mmol/L — ABNORMAL LOW (ref 101–111)
Creatinine, Ser: 3.7 mg/dL — ABNORMAL HIGH (ref 0.61–1.24)
GFR calc Af Amer: 18 mL/min — ABNORMAL LOW (ref >60)
GFR calc non Af Amer: 16 mL/min — ABNORMAL LOW (ref >60)
GLUCOSE: 103 mg/dL — AB (ref 65–99)
Potassium: 3.9 mmol/L (ref 3.5–5.1)
SODIUM: 137 mmol/L (ref 135–145)
Total Bilirubin: 2.8 mg/dL — ABNORMAL HIGH (ref 0.3–1.2)
Total Protein: 5.7 g/dL — ABNORMAL LOW (ref 6.5–8.1)

## 2016-06-30 LAB — COOXEMETRY PANEL
Carboxyhemoglobin: 2 % — ABNORMAL HIGH (ref 0.5–1.5)
METHEMOGLOBIN: 0.9 % (ref 0.0–1.5)
O2 Saturation: 74.1 %
TOTAL HEMOGLOBIN: 11.1 g/dL — AB (ref 12.0–16.0)

## 2016-06-30 LAB — CBC
HEMATOCRIT: 34.1 % — AB (ref 39.0–52.0)
HEMOGLOBIN: 11.1 g/dL — AB (ref 13.0–17.0)
MCH: 23.7 pg — AB (ref 26.0–34.0)
MCHC: 32.6 g/dL (ref 30.0–36.0)
MCV: 72.7 fL — AB (ref 78.0–100.0)
Platelets: 61 10*3/uL — ABNORMAL LOW (ref 150–400)
RBC: 4.69 MIL/uL (ref 4.22–5.81)
RDW: 18.9 % — ABNORMAL HIGH (ref 11.5–15.5)
WBC: 5.8 10*3/uL (ref 4.0–10.5)

## 2016-06-30 LAB — GLUCOSE, CAPILLARY
GLUCOSE-CAPILLARY: 97 mg/dL (ref 65–99)
GLUCOSE-CAPILLARY: 121 mg/dL — AB (ref 65–99)
Glucose-Capillary: 100 mg/dL — ABNORMAL HIGH (ref 65–99)
Glucose-Capillary: 127 mg/dL — ABNORMAL HIGH (ref 65–99)

## 2016-06-30 LAB — BASIC METABOLIC PANEL
Anion gap: 13 (ref 5–15)
BUN: 113 mg/dL — AB (ref 6–20)
CO2: 25 mmol/L (ref 22–32)
Calcium: 8.5 mg/dL — ABNORMAL LOW (ref 8.9–10.3)
Chloride: 97 mmol/L — ABNORMAL LOW (ref 101–111)
Creatinine, Ser: 3.65 mg/dL — ABNORMAL HIGH (ref 0.61–1.24)
GFR calc Af Amer: 19 mL/min — ABNORMAL LOW (ref >60)
GFR, EST NON AFRICAN AMERICAN: 16 mL/min — AB (ref >60)
GLUCOSE: 113 mg/dL — AB (ref 65–99)
POTASSIUM: 3.8 mmol/L (ref 3.5–5.1)
Sodium: 135 mmol/L (ref 135–145)

## 2016-06-30 LAB — MAGNESIUM: MAGNESIUM: 1.7 mg/dL (ref 1.7–2.4)

## 2016-06-30 LAB — T4, FREE: Free T4: 3.16 ng/dL — ABNORMAL HIGH (ref 0.61–1.12)

## 2016-06-30 LAB — C4 COMPLEMENT: Complement C4, Body Fluid: 19 mg/dL (ref 14–44)

## 2016-06-30 LAB — APTT
APTT: 103 s — AB (ref 24–36)
aPTT: 98 seconds — ABNORMAL HIGH (ref 24–36)

## 2016-06-30 LAB — ADAMTS13 ACTIVITY: Adamts 13 Activity: 50.1 % — ABNORMAL LOW (ref >66.8)

## 2016-06-30 LAB — TECHNOLOGIST SMEAR REVIEW

## 2016-06-30 LAB — KAPPA/LAMBDA LIGHT CHAINS
KAPPA FREE LGHT CHN: 112.5 mg/L — AB (ref 3.3–19.4)
KAPPA, LAMDA LIGHT CHAIN RATIO: 0.83 (ref 0.26–1.65)
LAMDA FREE LIGHT CHAINS: 135.3 mg/L — AB (ref 5.7–26.3)

## 2016-06-30 LAB — HEPARIN LEVEL (UNFRACTIONATED): Heparin Unfractionated: >2.2 IU/mL — ABNORMAL HIGH (ref 0.30–0.70)

## 2016-06-30 LAB — ADAMTS13 ACTIVITY REFLEX

## 2016-06-30 LAB — C3 COMPLEMENT: C3 Complement: 89 mg/dL (ref 82–167)

## 2016-06-30 MED ORDER — METHIMAZOLE 10 MG PO TABS
10.0000 mg | ORAL_TABLET | Freq: Three times a day (TID) | ORAL | Status: DC
  Administered 2016-06-30 – 2016-07-14 (×41): 10 mg via ORAL
  Filled 2016-06-30 (×41): qty 1

## 2016-06-30 MED ORDER — ORAL CARE MOUTH RINSE
15.0000 mL | Freq: Two times a day (BID) | OROMUCOSAL | Status: DC
  Administered 2016-06-30 – 2016-07-06 (×12): 15 mL via OROMUCOSAL

## 2016-06-30 MED ORDER — AMIODARONE HCL 200 MG PO TABS
200.0000 mg | ORAL_TABLET | Freq: Every day | ORAL | Status: DC
  Administered 2016-07-01: 200 mg via ORAL
  Filled 2016-06-30: qty 1

## 2016-06-30 MED ORDER — AMIODARONE HCL IN DEXTROSE 360-4.14 MG/200ML-% IV SOLN: 60.0000 mg/h | INTRAVENOUS | Status: DC

## 2016-06-30 MED ORDER — MEXILETINE HCL 200 MG PO CAPS
200.0000 mg | ORAL_CAPSULE | Freq: Three times a day (TID) | ORAL | Status: DC
  Administered 2016-06-30 (×2): 200 mg via ORAL
  Filled 2016-06-30 (×2): qty 1

## 2016-06-30 MED ORDER — "THROMBI-PAD 3""X3"" EX PADS"
1.0000 | MEDICATED_PAD | Freq: Once | CUTANEOUS | Status: AC
  Administered 2016-06-30: 1 via TOPICAL
  Filled 2016-06-30: qty 1

## 2016-06-30 MED ORDER — PREDNISONE 20 MG PO TABS
40.0000 mg | ORAL_TABLET | Freq: Every day | ORAL | Status: AC
  Administered 2016-07-01 – 2016-07-03 (×3): 40 mg via ORAL
  Filled 2016-06-30 (×3): qty 2

## 2016-06-30 MED ORDER — MAGNESIUM SULFATE 2 GM/50ML IV SOLN
2.0000 g | Freq: Once | INTRAVENOUS | Status: AC
  Administered 2016-06-30: 2 g via INTRAVENOUS
  Filled 2016-06-30: qty 50

## 2016-06-30 MED ORDER — FUROSEMIDE 10 MG/ML IJ SOLN
20.0000 mg/h | INTRAVENOUS | Status: DC
  Administered 2016-06-30: 8 mg/h via INTRAVENOUS
  Administered 2016-07-01 – 2016-07-02 (×2): 15 mg/h via INTRAVENOUS
  Administered 2016-07-02 – 2016-07-03 (×2): 20 mg/h via INTRAVENOUS
  Filled 2016-06-30 (×8): qty 25

## 2016-06-30 MED ORDER — POTASSIUM CHLORIDE CRYS ER 20 MEQ PO TBCR
40.0000 meq | EXTENDED_RELEASE_TABLET | Freq: Once | ORAL | Status: AC
  Administered 2016-06-30: 40 meq via ORAL
  Filled 2016-06-30: qty 2

## 2016-06-30 MED ORDER — AMIODARONE HCL IN DEXTROSE 360-4.14 MG/200ML-% IV SOLN: 30.0000 mg/h | INTRAVENOUS | Status: DC

## 2016-06-30 MED ORDER — COLCHICINE 0.6 MG PO TABS: 0.3000 mg | ORAL_TABLET | Freq: Every day | ORAL | Status: DC

## 2016-06-30 MED ORDER — SODIUM CHLORIDE 0.9% FLUSH
10.0000 mL | INTRAVENOUS | Status: DC | PRN
  Administered 2016-07-12 – 2016-07-13 (×6): 10 mL
  Filled 2016-06-30 (×6): qty 40

## 2016-06-30 MED ORDER — SODIUM CHLORIDE 0.9% FLUSH
10.0000 mL | Freq: Two times a day (BID) | INTRAVENOUS | Status: DC
  Administered 2016-06-30 – 2016-07-04 (×6): 10 mL
  Administered 2016-07-05: 20 mL
  Administered 2016-07-05 – 2016-07-11 (×10): 10 mL

## 2016-06-30 NOTE — Progress Notes (Deleted)
Physical Therapy Treatment Patient Details Name: Caleb Bauer MRN: [id number] DOB: 1950-03-20 Today's Date: 06/30/2016    History of Present Illness Patient is a 66 yo male admitted 06/27/16 with SOB and LE edema/cellulitis.  Patient with CHF, acute on CKD, and LE wounds/blisters.     PMH:  BLE cellulitis to blisters and skin loss, Lt posterior leg wound, COPD, DM, HTN, CHF, NICM, EF 20%, obesity, CKD    PT Comments    Pt progressing toward PT goals.  He tolerated ambulating in the room ~12 feet requiring min guard assist.  His HR reached 140's a few times throughout session but stayed ~95-110 during ambulation.  Current recommendations remain appropriate, as he will benefit from continued skilled therapy in order to maximize return to PLOF.  Will follow acutely.  Follow Up Recommendations  No PT follow up;Supervision - Intermittent     Equipment Recommendations  None recommended by PT    Recommendations for Other Services       Precautions / Restrictions Precautions Precautions: Fall Restrictions Weight Bearing Restrictions: No Other Position/Activity Restrictions: Keep LE's elevated for edema reduction    Mobility  Bed Mobility Overal bed mobility: Needs Assistance Bed Mobility: Supine to Sit;Sit to Supine     Supine to sit: Min guard Sit to supine: Mod assist   General bed mobility comments: Assist required to lift LE's into bed.  Cues required to position in center of bed by shifting hips/legs.  Transfers Overall transfer level: Needs assistance Equipment used: Rolling walker (2 wheeled) Transfers: Sit to/from Stand Sit to Stand: Min assist;+2 safety/equipment         General transfer comment: Pt required 2 attempts to achieve full standing.  Cues required for hand placement on bed stand>sit.  +2 for safety and line management.  Ambulation/Gait Ambulation/Gait assistance: Min guard Ambulation Distance (Feet): 12 Feet (bed > chair > door > bed) Assistive  device: Rolling walker (2 wheeled) Gait Pattern/deviations: Step-through pattern;Decreased stride length;Decreased dorsiflexion - right;Wide base of support Gait velocity: decreased Gait velocity interpretation: Below normal speed for age/gender General Gait Details: Patient with very slow gait due to pain in LE's with weightbearing.  No loss of balance with use of RW.  Decreased clearing of R foot with swing.  Pt awaiting PICC line procedure so RN requested he return to bed.   Stairs            Wheelchair Mobility    Modified Rankin (Stroke Patients Only)       Balance Overall balance assessment: Needs assistance Sitting-balance support: No upper extremity supported;Feet supported Sitting balance-Leahy Scale: Good     Standing balance support: Single extremity supported Standing balance-Leahy Scale: Poor Standing balance comment: Pt used urinal in standing.                            Cognition Arousal/Alertness: Awake/alert Behavior During Therapy: WFL for tasks assessed/performed Overall Cognitive Status: Within Functional Limits for tasks assessed                                        Exercises      General Comments General comments (skin integrity, edema, etc.): Both lower legs wrapped.      Pertinent Vitals/Pain Pain Assessment: Faces Faces Pain Scale: Hurts little more Pain Location: LE's Pain Descriptors / Indicators: Grimacing;Guarding Pain Intervention(s): Monitored  during session;Repositioned    Home Living                      Prior Function            PT Goals (current goals can now be found in the care plan section) Acute Rehab PT Goals Patient Stated Goal: To decrease pain. To return home. PT Goal Formulation: With patient Time For Goal Achievement: 07/04/16 Potential to Achieve Goals: Good Progress towards PT goals: Progressing toward goals    Frequency    Min 3X/week      PT Plan Current  plan remains appropriate    Co-evaluation              AM-PAC PT "6 Clicks" Daily Activity  Outcome Measure  Difficulty turning over in bed (including adjusting bedclothes, sheets and blankets)?: A Little Difficulty moving from lying on back to sitting on the side of the bed? : A Little Difficulty sitting down on and standing up from a chair with arms (e.g., wheelchair, bedside commode, etc,.)?: Total Help needed moving to and from a bed to chair (including a wheelchair)?: A Little Help needed walking in hospital room?: A Little Help needed climbing 3-5 steps with a railing? : A Lot 6 Click Score: 15    End of Session Equipment Utilized During Treatment: Gait belt;Oxygen Activity Tolerance: Patient tolerated treatment well Patient left: in bed;with call bell/phone within reach Nurse Communication: Mobility status PT Visit Diagnosis: Unsteadiness on feet (R26.81);Other abnormalities of gait and mobility (R26.89);Muscle weakness (generalized) (M62.81);Pain Pain - part of body: Leg     Time: 1537-1601 PT Time Calculation (min) (ACUTE ONLY): 24 min  Charges:  $Gait Training: 8-22 mins $Therapeutic Activity: 8-22 mins                    G Codes:       Caleb Bauer SPT   Caleb Bauer 06/30/2016, 5:01 PM

## 2016-06-30 NOTE — Progress Notes (Signed)
ANTICOAGULATION CONSULT NOTE - Follow Up Consult  Pharmacy Consult for Heparin (Apixaban on hold) Indication: atrial fibrillation  Allergies  Allergen Reactions  . Other Nausea And Vomiting    Patient stated drug is "kerein"? Formulation, dose, indication?   Patient Measurements: Height: 6' (182.9 cm) (per pt previously documented 6' 2: was incorrect) Weight: 225 lb 8.5 oz (102.3 kg) IBW/kg (Calculated) : 77.6  Vital Signs: Temp: 97.6 F (36.4 C) (04/30 1226) Temp Source: Oral (04/30 1226) BP: 118/57 (04/30 1226) Pulse Rate: 81 (04/30 1226)  Labs:  Recent Labs  06/27/16 1557 06/27/16 1843 06/28/16 0249  06/29/16 0630 06/29/16 1037 06/30/16 0202 06/30/16 1133  HGB  --   --   --   < > 12.0* 11.8* 11.1*  --   HCT  --   --   --   --  37.4* 36.8* 34.1*  --   PLT  --   --   --   --  63* 63* 61*  --   APTT  --   --   --   --   --  49* 103* 98*  LABPROT  --   --   --   --   --  33.5*  --   --   INR  --   --   --   --   --  3.20  --   --   HEPARINUNFRC  --   --   --   --   --   --  >2.20*  --   CREATININE  --   --  3.16*  --  3.46*  --  3.65*  --   TROPONINI 0.04* 0.04*  --   --   --   --   --   --   < > = values in this interval not displayed.  Estimated Creatinine Clearance: 25 mL/min (A) (by C-G formula based on SCr of 3.65 mg/dL (H)).  Assessment: On heparin while apixaban on hold in setting of worsening renal function.   aPTT is within therapeutic range this afternoon, no adjustment needed. No bleeding issues noted, hgb stable. Platelet count is low at baseline (60s) but stable, will follow closely.   Goal of Therapy:  Heparin level 0.3-0.7 units/ml aPTT 66-102 seconds Monitor platelets by anticoagulation protocol: Yes   Plan:  -Continue heparin at 1300 units/hr -Daily aptt, HL, CBC  Erin Hearing PharmD., BCPS Clinical Pharmacist Pager [phone] 06/30/2016 1:22 PM

## 2016-06-30 NOTE — Consult Note (Signed)
HPI:  Caleb Bauer a 66 y.o. male with history of CK D3 (baseline creatinine 1.6-1.8),  NICM (EF 20%), PAF/flutter, COPD, diabetes, hypertension, obesity and IDA who was admitted with lower extremity edema and dyspnea. Patient reports progressive lower extremity edema, chest pain and dyspnea for 3 weeks prior to admission. He says he has not taken his torsemide for 2 weeks leading to this. He says his baseline weight is about 190 pounds. He also reports decreased urine output and tea colored urine prior to admission. He denies dysuria. He denies over-the-counter pain medication of BC powders. Denies fever or other illness leading to this. He reports eating fast food at [employer] and [place].  Patient reports going to [facility]. He says his last visit was about 2 months ago. However, he does not remember who he saw there. He is followed by Dr. Einar Gip for his heart condition.  On admission, vital signs were significant for low normal blood pressure. CXR with pulmonary vascular congestion. BNP elevated to greater than 4500.  Initially attempted to diuresis with Lasix 80 twice a day with minimal response. Then switched to metolazone and torsemide with minimal fair amount of urine output. Today, he was started on Lasix drip by cardiologist.  Over the course of his admission, his serum creatinine trended up from 2.84 to 3.65. BUN trended from 105 to 113 today. So, nephrology was consulted due to his worsening renal function.   Past Medical History:  Diagnosis Date  . Acute renal failure ([place]) 11/23/2012  . Arthritis   . Atrial flutter by electrocardiogram ([place]) 11/2012  . Cardiomyopathy, dilated ([place])   . CHF (congestive heart failure) ([place])   . Chronic kidney disease   . Colon polyps   . COPD (chronic obstructive pulmonary disease) ([place])   . Diabetes mellitus without complication ([place])   . Encephalopathy acute 03/14/2013  . Enteritis due to Clostridium difficile 03/12/2014  . GERD  (gastroesophageal reflux disease)    appt 5/17  . Gout   . Gout flare 09/12/2013  . HCAP (healthcare-associated pneumonia) 03/24/2013   HCAP 03/09/13 >tx w/ abx  cXR 03/23/13 >improved but persistent bibasilar consolidation>cxr >   . Herpes    BUTT AND BACK- 9/23 HEALED  . Herpes zoster 04/18/2013  . Hypertension   . Sepsis due to undetermined organism with acute respiratory failure ([place]) 03/10/2013  . Sleep apnea    never used cpap but has one  . Small bowel obstruction ([place]) 10/2012  . UTI (urinary tract infection) 09/15/2013   Past Surgical History:  Procedure Laterality Date  . COLONOSCOPY WITH PROPOFOL N/A 08/06/2015   Procedure: COLONOSCOPY WITH PROPOFOL;  Surgeon: Gatha Mayer, MD;  Location: [facility];  Service: Endoscopy;  Laterality: N/A;  . HERNIA REPAIR     x 3  . INSERTION OF MESH N/A 05/30/2015   Procedure: INSERTION OF MESH;  Surgeon: Michael [place], MD;  Location: [facility];  Service: General;  Laterality: N/A;  . LAPAROSCOPIC INCISIONAL / UMBILICAL / [place] N/A 05/30/2015   Procedure: LYSIS OF ADHESION;  Surgeon: Michael [place], MD;  Location: [facility];  Service: General;  Laterality: N/A;  . VENTRAL HERNIA REPAIR N/A 05/30/2015   Procedure: LAPAROSCOPIC VENTRAL HERNIA REPAIR WITH MESH ;  Surgeon: Michael [place], MD;  Location: [place];  Service: General;  Laterality: N/A;   Social History:  reports that he quit smoking about 3 years ago. His smoking use included Cigarettes.  He has a 30.00 pack-year smoking history. He has never used smokeless tobacco. He reports that he does not drink alcohol or use drugs. Allergies:  Allergies  Allergen Reactions  . Other Nausea And Vomiting    Patient stated drug is "kerein"? Formulation, dose, indication?   Family History  Problem Relation Age of Onset  . Diabetes Brother   . Alzheimer's disease Mother   . Cancer Neg Hx     Medications: I have reviewed the patient's current medications.   ROS:  Review  of Systems  Constitutional: Negative for chills, diaphoresis and fever.  HENT: Positive for nosebleeds. Negative for sore throat.   Eyes: Negative for double vision.  Respiratory: Positive for cough and shortness of breath. Negative for hemoptysis.   Cardiovascular: Positive for leg swelling. Negative for chest pain.  Gastrointestinal: Negative for blood in stool, nausea and vomiting.  Genitourinary: Negative for dysuria, frequency and hematuria.  Skin: Negative for itching.  Neurological: Negative for dizziness and headaches.  Psychiatric/Behavioral: Negative for substance abuse.   Blood pressure 121/62, pulse (!) 37, temperature 97.6 F (36.4 C), temperature source Oral, resp. rate 14, height 6' (1.829 m), weight 225 lb 8.5 oz (102.3 kg), SpO2 94 %.  GEN: lying in bed, no apparent distress. Head: normocephalic and atraumatic  Eyes: conjunctiva without injection, sclera anicteric CVS: Intermittently tachycardic, regular rhythm, S1, S2 and S3 heard, pitting edema to his hips,   RESP: mild IWOB, significant for expiratory wheezes and bibasilar crackles bilaterally GI: big, BS present & normal, ?ascitic fluid, no guarding, no rebound, no mass GU: no CVA tenderness.  MSK: Bilateral lower extremity edema, compression wrapps in place NEURO: alert and oiented appropriately, no gross defecits  PSYCH: euthymic mood with congruent affect  Results for orders placed or performed during the hospital encounter of 06/27/16 (from the past 48 hour(s))  Glucose, capillary     Status: None   Collection Time: 06/28/16  4:53 PM  Result Value Ref Range   Glucose-Capillary 90 65 - 99 mg/dL   Comment 1 Notify RN   Glucose, capillary     Status: Abnormal   Collection Time: 06/28/16  8:52 PM  Result Value Ref Range   Glucose-Capillary 113 (H) 65 - 99 mg/dL   Comment 1 Notify RN    Comment 2 Document in Chart   Basic metabolic panel     Status: Abnormal   Collection Time: 06/29/16  6:30 AM  Result  Value Ref Range   Sodium 132 (L) 135 - 145 mmol/L   Potassium 4.2 3.5 - 5.1 mmol/L   Chloride 96 (L) 101 - 111 mmol/L   CO2 22 22 - 32 mmol/L   Glucose, Bld 85 65 - 99 mg/dL   BUN 108 (H) 6 - 20 mg/dL   Creatinine, Ser 3.46 (H) 0.61 - 1.24 mg/dL   Calcium 8.6 (L) 8.9 - 10.3 mg/dL   GFR calc non Af Amer 17 (L) >60 mL/min   GFR calc Af Amer 20 (L) >60 mL/min    Comment: (NOTE) The eGFR has been calculated using the CKD EPI equation. This calculation has not been validated in all clinical situations. eGFR's persistently <60 mL/min signify possible Chronic Kidney Disease.    Anion gap 14 5 - 15  CBC     Status: Abnormal   Collection Time: 06/29/16  6:30 AM  Result Value Ref Range   WBC 6.3 4.0 - 10.5 K/uL   RBC 5.08 4.22 - 5.81 MIL/uL   Hemoglobin 12.0 (  L) 13.0 - 17.0 g/dL   HCT 37.4 (L) 39.0 - 52.0 %   MCV 73.6 (L) 78.0 - 100.0 fL   MCH 23.6 (L) 26.0 - 34.0 pg   MCHC 32.1 30.0 - 36.0 g/dL   RDW 19.1 (H) 11.5 - 15.5 %   Platelets 63 (L) 150 - 400 K/uL    Comment: CONSISTENT WITH PREVIOUS RESULT  Glucose, capillary     Status: None   Collection Time: 06/29/16  7:28 AM  Result Value Ref Range   Glucose-Capillary 80 65 - 99 mg/dL   Comment 1 Notify RN   CBC with Differential/Platelet     Status: Abnormal   Collection Time: 06/29/16 10:37 AM  Result Value Ref Range   WBC 5.3 4.0 - 10.5 K/uL   RBC 4.97 4.22 - 5.81 MIL/uL   Hemoglobin 11.8 (L) 13.0 - 17.0 g/dL   HCT 36.8 (L) 39.0 - 52.0 %   MCV 74.0 (L) 78.0 - 100.0 fL   MCH 23.7 (L) 26.0 - 34.0 pg   MCHC 32.1 30.0 - 36.0 g/dL   RDW 19.0 (H) 11.5 - 15.5 %   Platelets 63 (L) 150 - 400 K/uL    Comment: CONSISTENT WITH PREVIOUS RESULT   Neutrophils Relative % 79 %   Neutro Abs 4.2 1.7 - 7.7 K/uL   Lymphocytes Relative 14 %   Lymphs Abs 0.7 0.7 - 4.0 K/uL   Monocytes Relative 6 %   Monocytes Absolute 0.3 0.1 - 1.0 K/uL   Eosinophils Relative 1 %   Eosinophils Absolute 0.1 0.0 - 0.7 K/uL   Basophils Relative 0 %   Basophils  Absolute 0.0 0.0 - 0.1 K/uL  Protime-INR     Status: Abnormal   Collection Time: 06/29/16 10:37 AM  Result Value Ref Range   Prothrombin Time 33.5 (H) 11.4 - 15.2 seconds   INR 3.20   Fibrinogen     Status: None   Collection Time: 06/29/16 10:37 AM  Result Value Ref Range   Fibrinogen 351 210 - 475 mg/dL  Vitamin B12     Status: None   Collection Time: 06/29/16 10:37 AM  Result Value Ref Range   Vitamin B-12 789 180 - 914 pg/mL    Comment: (NOTE) This assay is not validated for testing neonatal or myeloproliferative syndrome specimens for Vitamin B12 levels.   TSH     Status: Abnormal   Collection Time: 06/29/16 10:37 AM  Result Value Ref Range   TSH <0.010 (L) 0.350 - 4.500 uIU/mL    Comment: Performed by a 3rd Generation assay with a functional sensitivity of <=0.01 uIU/mL.  APTT     Status: Abnormal   Collection Time: 06/29/16 10:37 AM  Result Value Ref Range   aPTT 49 (H) 24 - 36 seconds    Comment:        IF BASELINE aPTT IS ELEVATED, SUGGEST PATIENT RISK ASSESSMENT BE USED TO DETERMINE APPROPRIATE ANTICOAGULANT THERAPY.   Iron and TIBC     Status: Abnormal   Collection Time: 06/29/16 10:37 AM  Result Value Ref Range   Iron 40 (L) 45 - 182 ug/dL   TIBC 218 (L) 250 - 450 ug/dL   Saturation Ratios 18 17.9 - 39.5 %   UIBC 178 ug/dL  Ferritin     Status: None   Collection Time: 06/29/16 10:37 AM  Result Value Ref Range   Ferritin 217 24 - 336 ng/mL  Type and screen [place]  Status: None   Collection Time: 06/29/16 10:50 AM  Result Value Ref Range   ABO/RH(D) O POS    Antibody Screen NEG    Sample Expiration 07/02/2016   ABO/Rh     Status: None   Collection Time: 06/29/16 10:50 AM  Result Value Ref Range   ABO/RH(D) O POS   Glucose, capillary     Status: Abnormal   Collection Time: 06/29/16 11:35 AM  Result Value Ref Range   Glucose-Capillary 132 (H) 65 - 99 mg/dL   Comment 1 Notify RN   MRSA PCR Screening     Status: None    Collection Time: 06/29/16 12:32 PM  Result Value Ref Range   MRSA by PCR NEGATIVE NEGATIVE    Comment:        The GeneXpert MRSA Assay (FDA approved for NASAL specimens only), is one component of a comprehensive MRSA colonization surveillance program. It is not intended to diagnose MRSA infection nor to guide or monitor treatment for MRSA infections.   Glucose, capillary     Status: None   Collection Time: 06/29/16  4:07 PM  Result Value Ref Range   Glucose-Capillary 83 65 - 99 mg/dL  Glucose, capillary     Status: Abnormal   Collection Time: 06/29/16  9:16 PM  Result Value Ref Range   Glucose-Capillary 139 (H) 65 - 99 mg/dL   Comment 1 Notify RN   Basic metabolic panel     Status: Abnormal   Collection Time: 06/30/16  2:02 AM  Result Value Ref Range   Sodium 135 135 - 145 mmol/L   Potassium 3.8 3.5 - 5.1 mmol/L   Chloride 97 (L) 101 - 111 mmol/L   CO2 25 22 - 32 mmol/L   Glucose, Bld 113 (H) 65 - 99 mg/dL   BUN 113 (H) 6 - 20 mg/dL   Creatinine, Ser 3.65 (H) 0.61 - 1.24 mg/dL   Calcium 8.5 (L) 8.9 - 10.3 mg/dL   GFR calc non Af Amer 16 (L) >60 mL/min   GFR calc Af Amer 19 (L) >60 mL/min    Comment: (NOTE) The eGFR has been calculated using the CKD EPI equation. This calculation has not been validated in all clinical situations. eGFR's persistently <60 mL/min signify possible Chronic Kidney Disease.    Anion gap 13 5 - 15  Heparin level (unfractionated)     Status: Abnormal   Collection Time: 06/30/16  2:02 AM  Result Value Ref Range   Heparin Unfractionated >2.20 (H) 0.30 - 0.70 IU/mL    Comment: RESULTS CONFIRMED BY MANUAL DILUTION        IF HEPARIN RESULTS ARE BELOW EXPECTED VALUES, AND PATIENT DOSAGE HAS BEEN CONFIRMED, SUGGEST FOLLOW UP TESTING OF ANTITHROMBIN III LEVELS.   CBC     Status: Abnormal   Collection Time: 06/30/16  2:02 AM  Result Value Ref Range   WBC 5.8 4.0 - 10.5 K/uL   RBC 4.69 4.22 - 5.81 MIL/uL   Hemoglobin 11.1 (L) 13.0 - 17.0 g/dL    HCT 34.1 (L) 39.0 - 52.0 %   MCV 72.7 (L) 78.0 - 100.0 fL   MCH 23.7 (L) 26.0 - 34.0 pg   MCHC 32.6 30.0 - 36.0 g/dL   RDW 18.9 (H) 11.5 - 15.5 %   Platelets 61 (L) 150 - 400 K/uL    Comment: CONSISTENT WITH PREVIOUS RESULT  APTT     Status: Abnormal   Collection Time: 06/30/16  2:02 AM  Result Value Ref Range  aPTT 103 (H) 24 - 36 seconds    Comment:        IF BASELINE aPTT IS ELEVATED, SUGGEST PATIENT RISK ASSESSMENT BE USED TO DETERMINE APPROPRIATE ANTICOAGULANT THERAPY.   Technologist smear review     Status: None   Collection Time: 06/30/16  2:02 AM  Result Value Ref Range   Tech Review TARGET CELLS     Comment: ELLIPTOCYTES  Magnesium     Status: None   Collection Time: 06/30/16  2:02 AM  Result Value Ref Range   Magnesium 1.7 1.7 - 2.4 mg/dL  Glucose, capillary     Status: None   Collection Time: 06/30/16  7:39 AM  Result Value Ref Range   Glucose-Capillary 97 65 - 99 mg/dL  APTT     Status: Abnormal   Collection Time: 06/30/16 11:33 AM  Result Value Ref Range   aPTT 98 (H) 24 - 36 seconds    Comment:        IF BASELINE aPTT IS ELEVATED, SUGGEST PATIENT RISK ASSESSMENT BE USED TO DETERMINE APPROPRIATE ANTICOAGULANT THERAPY.   Glucose, capillary     Status: Abnormal   Collection Time: 06/30/16 12:25 PM  Result Value Ref Range   Glucose-Capillary 121 (H) 65 - 99 mg/dL   No results found.  Assessment/plan:  AKI on CKD-3: Baseline creatinine 1.6-1.8.  AK likely due to hypoperfusion from cardiorenal syndrome, and diuretics. FeUrea suggestive for prerenal etiology. Renal ultrasound with increased echogenicity bilaterally otherwise normal. PVR < 100 mL. UA with many bacteria, large Hgb, large LE, increased RBC and WBC concerning for UTI. However, he has no urinary symptoms except decreased urine output. No fever or leukocytosis either. Serum creatinine trended from 2.84 admission to 3.65. BUN went from 105-113. Exam with some asterixis bilaterally.   Agree with  Lasix drip per cardiology.   Would avoid PICC line in case we need tunneled HD catheter. Will reassess response to  IV Lasix drip tomorrow.   Continue milronone  Strict I and O's, and daily weights  Follow-up autoimmune labs  Daily renal function  Volume: patient with fluid overload likely due to noncompliance with medication, dietary indiscretion and renal failure.  Exam was pitting edema to his hips, crackles bilaterally and ascites. Dry weight about 190 pounds. Currently 222 pounds.   Plan as above  CHF/NICM: Echo on 4/28 with ejection fraction of 20% and mod-severe MR and moderate retro-dilated RV  On digoxin and Primacor  Cardiology and heart failure team on board  PAF/PVC's/NSVT: Amiodarone discontinued due to toxicity (low TSH). Frequent PVCs and NSVT this morning  Started on mexiletine and heparin gtt  Anemia: Microcytic. Hgb 11.1 (at baseline). Iron saturation 18%.   Could benefit from iron infusion given his cardiac comorbidity  Hypertension: Low BPs on admission.   Improved with milrinone and digoxin  Other issues per primary.   Mercy Riding 06/30/2016, 2:32 PM

## 2016-06-30 NOTE — Plan of Care (Signed)
Problem: Pain Managment: Goal: General experience of comfort will improve Outcome: Completed/Met Date Met: 06/30/16 Pt. has little pain that is managed well with rest, relaxation and frequent repositioning.

## 2016-06-30 NOTE — Consult Note (Addendum)
Advanced Heart Failure Team Consult Note  Referring Physician: Dr Nadyne Coombes Primary Physician: Primary Cardiologist:  Dr Nadyne Coombes HF MD: Dr Aundra Dubin   Reason for Consultation: A/C Systolic Heart Failure   HPI:   Mr Fick is being seen today for A/C Systolic Heart Failure at the request of Dr Posey Pronto.   Mr Efferson is a 66 year old with a history of DMII, HTN, HF, COPD, former ETOH abuse (stopped drinking 2014), noncompliance, and systolic heart failure admitted with volume overload in the setting on medication noncompliance.    In 2015 he was seen in the HF clinic but has since been lost to follow up. Prior to admit he was SOB with exertion. Unable to walk around the grocery store. Says he has to use an electric cart. Requires assistance with transportation. Able to pay for meds but to just elected to take a break from his medications.   He presented to [facility] ED with increased leg edema. Prior to admit he stopped taking HF meds for at least for 2 weeks. SOB with exertion. BP in ED was low. Felt to be volume overloaded so he was started on IV lasix. CXR on admit with vascular congestion. Pertinent admission labs include: BNP >4500, Creatinine 2.84, K 3.5, Troponin 0.04, Hgb 11.5 and WBC 4.2. Renal US- negative for hydronephrosis but with medical renal disease.    Cardiology consult completed on 4/29 by Dr Nadyne Coombes. He started him on dig, hydralazine, isoridil, metolazone, and torsemide. 06/29/2016 TSH < 0.010. Todays creatinine trending up 3.1>3.4>3.6. Earlier this morning he had 31 beats of VT. Asymptomatic at the time.   Remains SOB with exertion.   ECHO 06/28/16  EF 20% Mod-Severe MR RV moderately dilated.  Review of Systems: [y] = yes, [ ]  = no   General: Weight gain [ Y]; Weight loss [ ] ; Anorexia [ ] ; Fatigue [Y ]; Fever [ ] ; Chills [ ] ; Weakness [ ]   Cardiac: Chest pain/pressure [ ] ; Resting SOB [ ] ; Exertional SOB [Y ]; Orthopnea [ Y]; Pedal Edema [ ] ; Palpitations [ ] ; Syncope [ ] ; Presyncope [ ] ;  Paroxysmal nocturnal dyspnea[ ]   Pulmonary: Cough [ Y]; Wheezing[ ] ; Hemoptysis[ ] ; Sputum [ ] ; Snoring [ ]   GI: Vomiting[ ] ; Dysphagia[ ] ; Melena[ ] ; Hematochezia [ ] ; Heartburn[ ] ; Abdominal pain [ ] ; Constipation [ ] ; Diarrhea [ ] ; BRBPR [ ]   GU: Hematuria[ ] ; Dysuria [ ] ; Nocturia[ ]   Vascular: Pain in legs with walking [ ] ; Pain in feet with lying flat [ ] ; Non-healing sores [ ] ; Stroke [ ] ; TIA [ ] ; Slurred speech [ ] ;  Neuro: Headaches[ ] ; Vertigo[ ] ; Seizures[ ] ; Paresthesias[ ] ;Blurred vision [ ] ; Diplopia [ ] ; Vision changes [ ]   Ortho/Skin: Arthritis [ ] ; Joint pain [Y ]; Muscle pain [ ] ; Joint swelling [ ] ; Back Pain [ ] ; Rash [ ]   Psych: Depression[ ] ; Anxiety[ ]   Heme: Bleeding problems [ ] ; Clotting disorders [ ] ; Anemia [ ]   Endocrine: Diabetes [ Y]; Thyroid dysfunction[ ]   Home Medications Prior to Admission medications   Medication Sig Start Date End Date Taking? Authorizing Provider  albuterol (PROVENTIL) (2.5 MG/3ML) 0.083% nebulizer solution Take 2.5 mg by nebulization every 6 (six) hours as needed for wheezing or shortness of breath.   Yes Historical Provider, MD  allopurinol (ZYLOPRIM) 100 MG tablet TAKE ONE TABLET BY MOUTH ONCE DAILY 09/13/14  Yes Jolaine Artist, MD  amiodarone (PACERONE) 200 MG tablet Take 1 tablet (200 mg total) by mouth  daily. 06/06/13  Yes Jolaine Artist, MD  apixaban (ELIQUIS) 5 MG TABS tablet Take 5 mg by mouth 2 (two) times daily.   Yes Historical Provider, MD  Caffeine-Magnesium Salicylate (DIUREX PO) Take 1 tablet by mouth daily as needed (For water weight or bloating.).    Yes Historical Provider, MD  carvedilol (COREG) 3.125 MG tablet Take 1 tablet (3.125 mg total) by mouth 2 (two) times daily. 06/06/13  Yes Jolaine Artist, MD  colchicine 0.6 MG tablet Take 1 tablet (0.6 mg total) by mouth 2 (two) times daily as needed (gout flair). 03/16/14  Yes Delfina Redwood, MD  digoxin (LANOXIN) 0.25 MG tablet Take 0.125 mg by mouth every other  day.   Yes Historical Provider, MD  hydrALAZINE (APRESOLINE) 10 MG tablet Take 10 mg by mouth 3 (three) times daily.   Yes Historical Provider, MD  isosorbide dinitrate (ISORDIL) 10 MG tablet Take 10 mg by mouth 3 (three) times daily.   Yes Historical Provider, MD  lisinopril (PRINIVIL,ZESTRIL) 2.5 MG tablet Take 2.5 mg by mouth daily.   Yes Historical Provider, MD  metolazone (ZAROXOLYN) 5 MG tablet Take 5 mg by mouth daily.   Yes Historical Provider, MD  torsemide (DEMADEX) 20 MG tablet Take 2 tablets (40 mg total) by mouth 2 (two) times a week. Every Monday and Friday, Hold for wt less than 193 lb 03/16/14  Yes Delfina Redwood, MD  traMADol (ULTRAM) 50 MG tablet Take 1-2 tablets (50-100 mg total) by mouth every 6 (six) hours as needed for moderate pain or severe pain. 05/30/15  Yes Michael Boston, MD    Past Medical History: Past Medical History:  Diagnosis Date  . Acute renal failure ([place]) 11/23/2012  . Arthritis   . Atrial flutter by electrocardiogram ([place]) 11/2012  . Cardiomyopathy, dilated ([place])   . CHF (congestive heart failure) ([place])   . Chronic kidney disease   . Colon polyps   . COPD (chronic obstructive pulmonary disease) ([place])   . Diabetes mellitus without complication ([place])   . Encephalopathy acute 03/14/2013  . Enteritis due to Clostridium difficile 03/12/2014  . GERD (gastroesophageal reflux disease)    appt 5/17  . Gout   . Gout flare 09/12/2013  . HCAP (healthcare-associated pneumonia) 03/24/2013   HCAP 03/09/13 >tx w/ abx  cXR 03/23/13 >improved but persistent bibasilar consolidation>cxr >   . Herpes    BUTT AND BACK- 9/23 HEALED  . Herpes zoster 04/18/2013  . Hypertension   . Sepsis due to undetermined organism with acute respiratory failure ([place]) 03/10/2013  . Sleep apnea    never used cpap but has one  . Small bowel obstruction ([place]) 10/2012  . UTI (urinary tract infection) 09/15/2013    Past Surgical History: Past Surgical History:  Procedure Laterality Date  .  COLONOSCOPY WITH PROPOFOL N/A 08/06/2015   Procedure: COLONOSCOPY WITH PROPOFOL;  Surgeon: Gatha Mayer, MD;  Location: [facility];  Service: Endoscopy;  Laterality: N/A;  . HERNIA REPAIR     x 3  . INSERTION OF MESH N/A 05/30/2015   Procedure: INSERTION OF MESH;  Surgeon: Michael Boston, MD;  Location: [place];  Service: General;  Laterality: N/A;  . LAPAROSCOPIC INCISIONAL / UMBILICAL / [place] N/A 05/30/2015   Procedure: LYSIS OF ADHESION;  Surgeon: Michael Boston, MD;  Location: [place];  Service: General;  Laterality: N/A;  . VENTRAL HERNIA REPAIR N/A 05/30/2015   Procedure: LAPAROSCOPIC VENTRAL HERNIA  REPAIR WITH MESH ;  Surgeon: Michael Boston, MD;  Location: [facility] OR;  Service: General;  Laterality: N/A;    Family History: Family History  Problem Relation Age of Onset  . Diabetes Brother   . Alzheimer's disease Mother   . Cancer Neg Hx     Social History: Social History   Social History  . Marital status: Single    Spouse name: N/A  . Number of children: 0  . Years of education: N/A   Occupational History  . retired    Social History Main Topics  . Smoking status: Former Smoker    Packs/day: 1.00    Years: 30.00    Types: Cigarettes    Quit date: 09/28/2012  . Smokeless tobacco: Never Used  . Alcohol use No     Comment: 2015 last alcohol"  . Drug use: No  . Sexual activity: No   Other Topics Concern  . None   Social History Narrative  . None    Allergies:  Allergies  Allergen Reactions  . Other Nausea And Vomiting    Patient stated drug is "kerein"? Formulation, dose, indication?    Objective:    Vital Signs:   Temp:  [96.2 F (35.7 C)-98.4 F (36.9 C)] 97.4 F (36.3 C) (04/30 0840) Pulse Rate:  [33-86] 41 (04/30 0900) Resp:  [13-25] 22 (04/30 0900) BP: (88-123)/(54-90) 113/54 (04/30 0900) SpO2:  [77 %-100 %] 88 % (04/30 0943) Weight:  [225 lb 8.5 oz (102.3 kg)] 225 lb 8.5 oz (102.3 kg) (04/30 0237) Last BM Date:  06/29/16  Weight change: Filed Weights   06/28/16 0640 06/29/16 0355 06/30/16 0237  Weight: 225 lb 12.8 oz (102.4 kg) 226 lb 8 oz (102.7 kg) 225 lb 8.5 oz (102.3 kg)    Intake/Output:   Intake/Output Summary (Last 24 hours) at 06/30/16 1021 Last data filed at 06/30/16 0930  Gross per 24 hour  Intake          1015.87 ml  Output             1025 ml  Net            -9.13 ml     Physical Exam: General:  Chronically ill . In bed.  No resp difficulty HEENT: normal Neck: supple. JVP to jaw. Carotids 2+ bilat; no bruits. No lymphadenopathy or thryomegaly appreciated. Cor: PMI nondisplaced. Irregular rate & rhythm. No rubs. + S3. 2/6 HSM LLSB/apex Lungs: RLL and LLL crackles on 3 liters oxygen.  Abdomen: obese, soft, nontender, nondistended. No hepatosplenomegaly. No bruits or masses. Good bowel sounds. Extremities: no cyanosis, clubbing, rash, R and LLE 2+ with compression wraps.  Neuro: alert & orientedx3, cranial nerves grossly intact. moves all 4 extremities w/o difficulty. Affect pleasant  Telemetry: NSR with frequent PVCs. Had >30 NSVT earlier today. Personally reviewed.   Labs: Basic Metabolic Panel:  Recent Labs Lab 06/27/16 0348 06/27/16 0708 06/28/16 0249 06/29/16 0630 06/30/16 0202  NA 135  --  136 132* 135  K 3.5  --  3.5 4.2 3.8  CL 100*  --  100* 96* 97*  CO2 22  --  25 22 25   GLUCOSE 89  --  134* 85 113*  BUN 105*  --  105* 108* 113*  CREATININE 2.84* 2.79* 3.16* 3.46* 3.65*  CALCIUM 8.3*  --  8.2* 8.6* 8.5*  MG  --   --   --   --  1.7    Liver Function Tests:  Recent Labs Lab  06/27/16 0348  AST 18  ALT 18  ALKPHOS 66  BILITOT 3.0*  PROT 6.1*  ALBUMIN 2.2*   No results for input(s): LIPASE, AMYLASE in the last 168 hours. No results for input(s): AMMONIA in the last 168 hours.  CBC:  Recent Labs Lab 06/27/16 0348 06/29/16 0630 06/29/16 1037 06/30/16 0202  WBC 4.2 6.3 5.3 5.8  NEUTROABS 2.9  --  4.2  --   HGB 11.5* 12.0* 11.8* 11.1*   HCT 35.2* 37.4* 36.8* 34.1*  MCV 72.6* 73.6* 74.0* 72.7*  PLT 68* 63* 63* 61*    Cardiac Enzymes:  Recent Labs Lab 06/27/16 0348 06/27/16 0708 06/27/16 1557 06/27/16 1843  TROPONINI 0.04* 0.04* 0.04* 0.04*    BNP: BNP (last 3 results)  Recent Labs  06/27/16 0349  BNP >4,500.0*    ProBNP (last 3 results) No results for input(s): PROBNP in the last 8760 hours.   CBG:  Recent Labs Lab 06/29/16 0728 06/29/16 1135 06/29/16 1607 06/29/16 2116 06/30/16 0739  GLUCAP 80 132* 83 139* 97    Coagulation Studies:  Recent Labs  06/29/16 1037  LABPROT 33.5*  INR 3.20    Other results: EKG:   Imaging: US Renal  Result Date: 06/28/2016 CLINICAL DATA:  Acute kidney injury EXAM: RENAL / URINARY TRACT ULTRASOUND COMPLETE COMPARISON:  03/11/2014 FINDINGS: Right Kidney: Length: 12 cm. Increased renal cortical echogenicity. No hydronephrosis visualized. 2.2 x 2.2 x 2.1 cm anechoic right interpolar renal mass most consistent with a cyst. Left Kidney: Length: 11.8 cm. Increased renal cortical echogenicity. No hydronephrosis visualized. 2.5 x 2.2 x 2.1 cm anechoic left lower pole renal mass most consistent with a cyst. Bladder: Appears normal for degree of bladder distention. Soft tissue: Small left pleural effusion.  Abdominal ascites. IMPRESSION: 1. Bilateral increased renal cortical echogenicity as can be seen with medical renal disease. 2. Bilateral renal cysts. Electronically Signed   By: Kathreen Devoid   On: 06/28/2016 11:36      Medications:     Current Medications: . albuterol  3 mL Inhalation BID  . allopurinol  100 mg Oral Daily  . amiodarone  200 mg Oral Daily  . digoxin  0.125 mg Oral QODAY  . guaiFENesin  600 mg Oral BID  . insulin aspart  0-9 Units Subcutaneous TID WC  . mouth rinse  15 mL Mouth Rinse BID  . polyethylene glycol  17 g Oral Daily  . sodium chloride flush  3 mL Intravenous Q12H     Infusions: . sodium chloride    . furosemide (LASIX)  infusion    . heparin 1,300 Units/hr (06/30/16 0358)  . milrinone 0.125 mcg/kg/min (06/30/16 0813)      Assessment/Plan/Discussion:  Mr Loadholt is 66 year old with history of chronic systolic heart failure and NICM thought to be from ETOH abuse admitted with A/C systolic heart failure in the setting of medication noncompliance.   1. A/C Systolic Biventricular Heart Failure - NICM per Dr Nadyne Coombes.He has not had ischemic work up.  ECHO 06/28/16 EF 20% . Initially diagnosed with low EF 2014. He does not have an ICD.  Volume overload in the setting of medication noncompliance. Poor response to intermittent IV lasix. Lasix drip started with poor response. Increase lasix drip to 15 mg per hour. Continue milrinone 0.125 mcg. Will place picc for CVP and CO-OX.  No bb. Stop dig due to CKD.  No room for hydralazine/imdur and will not be able to use ARB due to CKD.  2. AKI  on CKD-  Creatinine baseline ~2.  Creatinine has been trending up. Todays creatinine 3.6. Hopefully will improve with addition of milrinone. Nephrology consulted. May CRRT if he doesn't respond.   Renal US on 06/28/16 -. No hydronephrosis. Bilateral  increased renal cortical echogenicity.   3. NSVT - Keep K > 4 Mag 1.7 . Frequent PVCs and > 30 beats NSVT.  Unfortunately TSH low so will need to stop amio.  Start mexiletine 200 mg every 8 hours.   Give 2 grams mag now.  Consult EP 4. PAF- maintaining NSR. Off eliquis. Continue heparin.   Prior to admit he had been on amio. TSH low so will need to stop.  5. Hyperthyroidism- TSH <0.010. Will need to be started on methimazole per Primary team. Will check T3 and T4.  6. Thrombocytopenia- Platelets 61. No bleeding problems. No indication for HIT.  Per Primary team 7. COPD 8. Obesity   9. Medication Noncompliance  Length of Stay: 3  Amy Clegg NP-C  06/30/2016, 10:21 AM  Advanced Heart Failure Team Pager [phone] (M-F; 7a - 4p)  Please contact [facility] for night-coverage after  hours (4p -7a ) and weekends on [email or web address]  Patient seen with NP, agree with the above note.  1. Acute on chronic systolic CHF: Echo 9/48 with EF 20%, moderately dilated RV, moderate-severe MR and TR. Suspect low output HF with creatinine up considerably.  On exam today, he is significantly volume overloaded.  - Continue milrinone 0.125 for now, but place PICC today and will follow co-ox (suspect may need to increase).  Will follow CVP as well.  - Poor UOP with cardiorenal syndrome.  Increase Lasix gtt to 15 mg/hr, hopefully will improve UOP once we get him on an adequate dose of milrinone (awaiting co-ox).   - Stop digoxin with significantly elevated creatinine. Hold hydralazine/Imdur for now with borderline BP.  2. Hyperthyroidism: New diagnosis.  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  Will have to stop amiodarone (had actually been off at home for a couple of weeks prior to admission).  Should be on methimazole, discuss with [facility].  3. NSVT: NSVT and PVCs on milrinone.  He should not restart amiodarone with hyperthyroidism.  Will start him on mexiletine instead.  He does not have ICD.  Will have EP weigh in on treatment options, but suspect mexiletine is the way to go for now.  4. Atrial fibrillation: He is in NSR.  Off Eliquis with AKI and need for central access.  On heparin gtt.  As above, will use mexiletine as anti-arrhythmic.  5. Thrombocytopenia: Preceded this admission, doubt HIT, heparin ok unless plts continue to fall.  6. AKI on CKD stage 3: Suspect cardiorenal with poor cardiac output.  He had been of meds for about 2 wks prior to admission, may have triggered current situation.  Will try to optimize cardiac output.  Renal following, if we are unable to diurese him effectively with meds, may have to have CVVH.   Loralie Champagne 06/30/2016 4:17 PM  Appreciate EP input.  Not clear that stopping amiodarone will help hyperthyroidism and may make worse by increasing T4 to T3 conversion.  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   Loralie Champagne 06/30/2016

## 2016-06-30 NOTE — Progress Notes (Signed)
[facility] Progress Note  Patient: Caleb Bauer SEG:[id number]   PCP: [name], MD DOB: 1950/06/02   DOA: 06/27/2016   DOS: 06/30/2016   Date of Service: the patient was seen and examined on 06/30/2016  Subjective: Complains about pain in his right hip. No other acute complaint. Shortness of breath as well as leg swelling is about the same. No significant urine output in last 24 hours.  Brief hospital course: Pt. with PMH of nonischemic cardiac myopathy, systolic CHF, COPD, type II DM, HTN, obesity; admitted on 06/27/2016, with complaint of shortness of breath and lower leg edema, was found to have acute on chronic combined CHF. Currently further plan is follow recommendation from cardiology as well as EP as well as nephrology.  Assessment and Plan: 1. Acute on chronic combined systolic and diastolic CHF. Noncompliance with medication. NICM NSVT.  Echocardiogram shows 20% EF unchanged from prior. Due to soft blood pressure beta blockers are on hold. Due to his renal dysfunction ACE inhibitors and ARB's are contraindicated. Started on IV Lasix, no significant improvement with worsening of the renal function, this was followed by Primacor infusion. Without any significant improvement in urine output over last 24 hours. Heart failure service has been consulted and the patient is started on IV Lasix infusion. Continuing Primacor right now. EP consulted for his NSVT, started on Mexiletine. We will monitor LFT. Patient was on amiodarone and digoxin, currently both are discontinued.  2. Acute on chronic kidney disease. Baseline stage III BUN and serum creatinine significantly elevated. Chronic medical disease as per US renal as well as normal renal artery. FE urea suggest prerenal etiology. Requested nephrology consult as renal function worsens further. Currently diuresing Patient has a follow-up with his nephrologist in June. Avoid nephrotoxic medications at present. ACE  inhibitors and ARB are contraindicated. Due to his renal function I'm changing his Apixaban to heparin  3. Chronic thrmbocytopenia with acute worsening. Platelet counts have been running low since March 2017. No active evidence of bleeding. Monitor at present. With progressive worsening discussed with hematology, appears to be reactive and recommend close monitoring. lower extremity Doppler negative for any DVT.  No indication for HIT. No evidence of TTP HUS with normal fibrinogen, no schistocytes on smear.  4. Bilateral ulcers and erythema. Chronic full-thickness wound on the left posterior leg. Would care consulted, appreciate input. Xeroform, ABD as well as Ace wrap provided. Monitor.  5. Atrial flutter. chadvasc score 4 Currently rate controlled, patient on liquids at home, with renal dysfunction unsure whether this can be continued further. Discussed with the patient he may need to be switched to Coumadin due to his renal dysfunction. Currently on heparin instead of Eliquis. We'll monitor at present. EP consulted  6. Type 2 diabetes mellitus. A1c 6.2. Blood sugars well controlled. We'll monitor. Continue current regimen.  7. History of COPD. Occasional upper airway wheezing, no evidence of active exacerbation. Continue when necessary nebulizers. Scheduled Mucinex as well as flutter device.  8. Iron deficiency anemia. Low MCV. No active bleeding We'll monitor. Hemoglobin stable.  9. 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 T3-T4 ordered by cardiology. Amiodarone currently has been discontinued by cardiology and the patient is started on Mexiletine. Patient has been on amiodarone for more than 3 months, roughly since 2014. 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 the patients should be treated with prednisone 40 mg daily for type II disease.  Methimazole should be added if that  is  diagnostic uncertainty. His amiodarone is already stopped, although half life of roughly 8080 days patient will need supplemental treatment. We'll start on prednisone 40 mg daily.  Expect rapid improvement in hyperthyroid features. Biggest risk would be developing volume overload due to fluid retention caused by prednisone.  10 right groin pain. Getting x-ray of the hip.  Bowel regimen: last BM 06/27/2016 Diet: cardiac and carb modified diet DVT Prophylaxis: Eliquis changed to heparin.  Advance goals of care discussion: full code  Family Communication: no family was present at bedside, at the time of interview.  Disposition:  Discharge to home. Expected discharge date: 06/30/2016,   Consultants: Cardiology, heart failure services, Electrophysiologists, nephrology Procedures: Echocardiogram  Antibiotics: Anti-infectives    None       Objective: Physical Exam: Vitals:   06/30/16 0943 06/30/16 1100 06/30/16 1226 06/30/16 1300  BP:  109/67 (!) 118/57 121/62  Pulse:  71 81 (!) 37  Resp:  14 20 14   Temp:   97.6 F (36.4 C)   TempSrc:   Oral   SpO2: (!) 88% 96% 95% 94%  Weight:      Height:        Intake/Output Summary (Last 24 hours) at 06/30/16 1518 Last data filed at 06/30/16 1412  Gross per 24 hour  Intake          1372.31 ml  Output             1370 ml  Net             2.31 ml   Filed Weights   06/28/16 0640 06/29/16 0355 06/30/16 0237  Weight: 102.4 kg (225 lb 12.8 oz) 102.7 kg (226 lb 8 oz) 102.3 kg (225 lb 8.5 oz)   General: Alert, Awake and Oriented to Time, Place and Person. Appear in mild distress, affect appropriate Eyes: PERRL, Conjunctiva normal ENT: Oral Mucosa clear moist. Neck: difficult to assess JVD, no Abnormal Mass Or lumps Cardiovascular: S1 and S2 Present, aortic systolic Murmur, Respiratory: Bilateral Air entry equal and Decreased, no use of accessory muscle, bilateral Crackles, Occasional upper airway wheezes Abdomen: Bowel Sound  present, Soft and no tenderness Skin: no redness, no Rash, no induration Extremities: bilateral Pedal edema, no calf tenderness Neurologic: Grossly no focal neuro deficit. Bilaterally Equal motor strength  Data Reviewed: CBC:  Recent Labs Lab 06/27/16 0348 06/29/16 0630 06/29/16 1037 06/30/16 0202  WBC 4.2 6.3 5.3 5.8  NEUTROABS 2.9  --  4.2  --   HGB 11.5* 12.0* 11.8* 11.1*  HCT 35.2* 37.4* 36.8* 34.1*  MCV 72.6* 73.6* 74.0* 72.7*  PLT 68* 63* 63* 61*   Basic Metabolic Panel:  Recent Labs Lab 06/27/16 0348 06/27/16 0708 06/28/16 0249 06/29/16 0630 06/30/16 0202  NA 135  --  136 132* 135  K 3.5  --  3.5 4.2 3.8  CL 100*  --  100* 96* 97*  CO2 22  --  25 22 25   GLUCOSE 89  --  134* 85 113*  BUN 105*  --  105* 108* 113*  CREATININE 2.84* 2.79* 3.16* 3.46* 3.65*  CALCIUM 8.3*  --  8.2* 8.6* 8.5*  MG  --   --   --   --  1.7    Liver Function Tests:  Recent Labs Lab 06/27/16 0348  AST 18  ALT 18  ALKPHOS 66  BILITOT 3.0*  PROT 6.1*  ALBUMIN 2.2*   No results for input(s): LIPASE, AMYLASE in the last 168 hours. No results for  input(s): AMMONIA in the last 168 hours. Coagulation Profile:  Recent Labs Lab 06/29/16 1037  INR 3.20   Cardiac Enzymes:  Recent Labs Lab 06/27/16 0348 06/27/16 0708 06/27/16 1557 06/27/16 1843  TROPONINI 0.04* 0.04* 0.04* 0.04*   BNP (last 3 results) No results for input(s): PROBNP in the last 8760 hours. CBG:  Recent Labs Lab 06/29/16 1135 06/29/16 1607 06/29/16 2116 06/30/16 0739 06/30/16 1225  GLUCAP 132* 83 139* 97 121*   Studies: No results found.  Scheduled Meds: . albuterol  3 mL Inhalation BID  . allopurinol  100 mg Oral Daily  . guaiFENesin  600 mg Oral BID  . insulin aspart  0-9 Units Subcutaneous TID WC  . mouth rinse  15 mL Mouth Rinse BID  . mexiletine  200 mg Oral Q8H  . polyethylene glycol  17 g Oral Daily  . sodium chloride flush  3 mL Intravenous Q12H   Continuous Infusions: . sodium  chloride    . furosemide (LASIX) infusion 15 mg/hr (06/30/16 1145)  . heparin 1,300 Units/hr (06/30/16 1412)  . magnesium sulfate 1 - 4 g bolus IVPB    . milrinone 0.125 mcg/kg/min (06/30/16 0813)   PRN Meds: sodium chloride, acetaminophen, albuterol, bisacodyl, guaiFENesin-dextromethorphan, ondansetron (ZOFRAN) IV, sodium chloride flush, traMADol  Time spent: 35 minutes  Author: Berle Mull, MD [facility] Pager: [phone] 06/30/2016 3:18 PM  If 7PM-7AM, please contact night-coverage at [email or web address], password [facility]

## 2016-06-30 NOTE — Discharge Instructions (Signed)
Information on my medicine - ELIQUIS® (apixaban) ° °This medication education was reviewed with me or my healthcare representative as part of my discharge preparation.  The pharmacist that spoke with me during my hospital stay was:  Janny Crute Rhea, RPH ° °Why was Eliquis® prescribed for you? °Eliquis® was prescribed for you to reduce the risk of a blood clot forming that can cause a stroke if you have a medical condition called atrial fibrillation (a type of irregular heartbeat). ° °What do You need to know about Eliquis® ? °Take your Eliquis® TWICE DAILY - one tablet in the morning and one tablet in the evening with or without food. If you have difficulty swallowing the tablet whole please discuss with your pharmacist how to take the medication safely. ° °Take Eliquis® exactly as prescribed by your doctor and DO NOT stop taking Eliquis® without talking to the doctor who prescribed the medication.  Stopping may increase your risk of developing a stroke.  Refill your prescription before you run out. ° °After discharge, you should have regular check-up appointments with your healthcare provider that is prescribing your Eliquis®.  In the future your dose may need to be changed if your kidney function or weight changes by a significant amount or as you get older. ° °What do you do if you miss a dose? °If you miss a dose, take it as soon as you remember on the same day and resume taking twice daily.  Do not take more than one dose of ELIQUIS at the same time to make up a missed dose. ° °Important Safety Information °A possible side effect of Eliquis® is bleeding. You should call your healthcare provider right away if you experience any of the following: °  Bleeding from an injury or your nose that does not stop. °  Unusual colored urine (red or dark brown) or unusual colored stools (red or black). °  Unusual bruising for unknown reasons. °  A serious fall or if you hit your head (even if there is no  bleeding). ° °Some medicines may interact with Eliquis® and might increase your risk of bleeding or clotting while on Eliquis®. To help avoid this, consult your healthcare provider or pharmacist prior to using any new prescription or non-prescription medications, including herbals, vitamins, non-steroidal anti-inflammatory drugs (NSAIDs) and supplements. ° °This website has more information on Eliquis® (apixaban): http://www.eliquis.com/eliquis/home ° °

## 2016-06-30 NOTE — Progress Notes (Signed)
**  Preliminary report by tech**  Bilateral lower extremity venous duplex completed. There is no evidence of deep or superficial vein thrombosis involving the right and left lower extremities. All visualized vessels appear patent and compressible. There is no evidence of Baker's cysts bilaterally.  06/30/16 11:29 AM Carlos Levering RVT

## 2016-06-30 NOTE — Progress Notes (Addendum)
Pt noted to be in VT. 31 beats noted.   Pt asymptomatic.  bp 116/59 hr 91.  Md notified.   No new orders at this time.  Will continue to monitor. Saunders Revel T

## 2016-06-30 NOTE — Progress Notes (Signed)
Physical Therapy Treatment Patient Details Name: Caleb Bauer MRN: [id number] DOB: 06/10/1950 Today's Date: 06/30/2016    History of Present Illness Patient is a 66 yo male admitted 06/27/16 with SOB and LE edema/cellulitis.  Patient with CHF, acute on CKD, and LE wounds/blisters.     PMH:  BLE cellulitis to blisters and skin loss, Lt posterior leg wound, COPD, DM, HTN, CHF, NICM, EF 20%, obesity, CKD    PT Comments    Pt progressing toward PT goals.  He tolerated ambulating in the room 25 feet requiring min guard assist.  His HR reached 140's a few times throughout session but stayed ~95-110 during ambulation.  Current recommendations remain appropriate, as he will benefit from continued skilled therapy in order to maximize return to PLOF.  Will follow acutely.    Follow Up Recommendations  No PT follow up;Supervision - Intermittent     Equipment Recommendations  None recommended by PT    Recommendations for Other Services       Precautions / Restrictions Precautions Precautions: Fall Restrictions Weight Bearing Restrictions: No Other Position/Activity Restrictions: Keep LE's elevated for edema reduction    Mobility  Bed Mobility Overal bed mobility: Needs Assistance Bed Mobility: Supine to Sit;Sit to Supine     Supine to sit: Min guard Sit to supine: Mod assist   General bed mobility comments: Assist required to lift LE's into bed.  Cues required to position in center of bed by shifting hips/legs.  Transfers Overall transfer level: Needs assistance Equipment used: Rolling walker (2 wheeled) Transfers: Sit to/from Stand Sit to Stand: Min assist;+2 safety/equipment         General transfer comment: Pt required 2 attempts to achieve full standing.  Cues required for hand placement on bed stand>sit.  +2 for safety and line management.  Ambulation/Gait Ambulation/Gait assistance: Min guard Ambulation Distance (Feet): 25 Feet (bed > chair > door > bed) Assistive  device: Rolling walker (2 wheeled) Gait Pattern/deviations: Step-through pattern;Decreased stride length;Decreased dorsiflexion - right;Wide base of support Gait velocity: decreased Gait velocity interpretation: Below normal speed for age/gender General Gait Details: Patient with very slow gait due to pain in LE's with weightbearing.  No loss of balance with use of RW.  Decreased clearing of R foot with swing.  Pt awaiting PICC line procedure so RN requested he return to bed.   Stairs            Wheelchair Mobility    Modified Rankin (Stroke Patients Only)       Balance Overall balance assessment: Needs assistance Sitting-balance support: No upper extremity supported;Feet supported Sitting balance-Leahy Scale: Good     Standing balance support: Single extremity supported Standing balance-Leahy Scale: Poor Standing balance comment: Pt used urinal in standing.                            Cognition Arousal/Alertness: Awake/alert Behavior During Therapy: WFL for tasks assessed/performed Overall Cognitive Status: Within Functional Limits for tasks assessed                                        Exercises      General Comments General comments (skin integrity, edema, etc.): Both lower legs wrapped.      Pertinent Vitals/Pain Pain Assessment: Faces Faces Pain Scale: Hurts little more Pain Location: LE's Pain Descriptors / Indicators: Grimacing;Guarding Pain  Intervention(s): Monitored during session;Repositioned    Home Living                      Prior Function            PT Goals (current goals can now be found in the care plan section) Acute Rehab PT Goals Patient Stated Goal: To decrease pain. To return home. PT Goal Formulation: With patient Time For Goal Achievement: 07/04/16 Potential to Achieve Goals: Good Progress towards PT goals: Progressing toward goals    Frequency    Min 3X/week      PT Plan Current  plan remains appropriate    Co-evaluation              AM-PAC PT "6 Clicks" Daily Activity  Outcome Measure  Difficulty turning over in bed (including adjusting bedclothes, sheets and blankets)?: A Little Difficulty moving from lying on back to sitting on the side of the bed? : A Little Difficulty sitting down on and standing up from a chair with arms (e.g., wheelchair, bedside commode, etc,.)?: Total Help needed moving to and from a bed to chair (including a wheelchair)?: A Little Help needed walking in hospital room?: A Little Help needed climbing 3-5 steps with a railing? : A Lot 6 Click Score: 15    End of Session Equipment Utilized During Treatment: Gait belt;Oxygen Activity Tolerance: Patient tolerated treatment well Patient left: in bed;with call bell/phone within reach Nurse Communication: Mobility status PT Visit Diagnosis: Unsteadiness on feet (R26.81);Other abnormalities of gait and mobility (R26.89);Muscle weakness (generalized) (M62.81);Pain Pain - part of body: Leg     Time: 1537-1601 PT Time Calculation (min) (ACUTE ONLY): 24 min  Charges:  $Gait Training: 8-22 mins $Therapeutic Activity: 8-22 mins                    G Codes:       Gaetano Net SPT   Gaetano Net 06/30/2016, 5:13 PM

## 2016-06-30 NOTE — Progress Notes (Signed)
ANTICOAGULATION CONSULT NOTE - Follow Up Consult  Pharmacy Consult for Heparin (Apixaban on hold) Indication: atrial fibrillation  Allergies  Allergen Reactions  . Other Nausea And Vomiting    Patient stated drug is "kerein"? Formulation, dose, indication?   Patient Measurements: Height: 6' (182.9 cm) (per pt previously documented 6' 2: was incorrect) Weight: 225 lb 8.5 oz (102.3 kg) IBW/kg (Calculated) : 77.6  Vital Signs: Temp: 97.3 F (36.3 C) (04/30 0300) Temp Source: Oral (04/30 0300) BP: 114/78 (04/30 0300) Pulse Rate: 41 (04/30 0300)  Labs:  Recent Labs  06/27/16 0708 06/27/16 1557 06/27/16 1843 06/28/16 0249  06/29/16 0630 06/29/16 1037 06/30/16 0202  HGB  --   --   --   --   < > 12.0* 11.8* 11.1*  HCT  --   --   --   --   --  37.4* 36.8* 34.1*  PLT  --   --   --   --   --  63* 63* 61*  APTT  --   --   --   --   --   --  49* 103*  LABPROT  --   --   --   --   --   --  33.5*  --   INR  --   --   --   --   --   --  3.20  --   HEPARINUNFRC  --   --   --   --   --   --   --  >2.20*  CREATININE 2.79*  --   --  3.16*  --  3.46*  --  3.65*  TROPONINI 0.04* 0.04* 0.04*  --   --   --   --   --   < > = values in this interval not displayed.  Estimated Creatinine Clearance: 25 mL/min (A) (by C-G formula based on SCr of 3.65 mg/dL (H)).  Assessment: On heparin while apixaban on hold in setting of worsening renal function, aPTT is just above therapeutic range this AM, using aPTT to dose heparin for now given apixaban influence on anti-Xa levels  Goal of Therapy:  Heparin level 0.3-0.7 units/ml aPTT 66-102 seconds Monitor platelets by anticoagulation protocol: Yes   Plan:  -Dec heparin to 1300 units/hr -1200 aPTT  [name] 06/30/2016,3:54 AM

## 2016-06-30 NOTE — Progress Notes (Signed)
PT Cancellation Note  Patient Details Name: EVARISTO TSUDA MRN: [id number] DOB: [birth year]   Cancelled Treatment:    Reason Eval/Treat Not Completed: Patient at procedure or test/unavailable; RN reports pt for PICC and echo this morning.  Will check back later as time permits.    Reginia Naas 06/30/2016, 11:55 AM  Magda Kiel, [place] 06/30/2016

## 2016-06-30 NOTE — Progress Notes (Signed)
Subjective:  Still c/o dyspnea. No significant UOP on IV furosemide.   Objective:  Vital Signs in the last 24 hours: Temp:  [96.2 F (35.7 C)-97.6 F (36.4 C)] 97.6 F (36.4 C) (04/30 1226) Pulse Rate:  [33-86] 75 (04/30 1500) Resp:  [13-24] 21 (04/30 1500) BP: (93-123)/(54-90) 99/68 (04/30 1500) SpO2:  [77 %-99 %] 95 % (04/30 1500) Weight:  [102.3 kg (225 lb 8.5 oz)] 102.3 kg (225 lb 8.5 oz) (04/30 0237)  Intake/Output from previous day: 04/29 0701 - 04/30 0700 In: 1026.7 [P.O.:840; I.V.:186.7] Out: 1051 [Urine:1050; Stool:1]  Physical Exam: Blood pressure 99/68, pulse 75, temperature 97.6 F (36.4 C), temperature source Oral, resp. rate (!) 21, height 6' (1.829 m), weight 102.3 kg (225 lb 8.5 oz), SpO2 95 %.  General appearance: alert, cooperative, appears older than stated age, mild distress and mildly obese Eyes: negative findings: lids and lashes normal Neck: no adenopathy, no carotid bruit, supple, symmetrical, trachea midline, thyroid not enlarged, symmetric, no tenderness/mass/nodules and Elevated JVD present Neck: JVP - normal, carotids 2+= without bruits Resp: Bibasilar crackles present Chest wall: no tenderness Cardio: S1, S2 normal, S3 present and systolic murmur: early systolic 2/6, crescendo at 2nd right intercostal space, at lower left sternal border GI: soft, non-tender; bowel sounds normal; no masses,  no organomegaly and ascites present Extremities: venous stasis dermatitis noted and 2-3 plus edema    Lab Results: BMP  Recent Labs  06/28/16 0249 06/29/16 0630 06/30/16 0202  NA 136 132* 135  K 3.5 4.2 3.8  CL 100* 96* 97*  CO2 25 22 25   GLUCOSE 134* 85 113*  BUN 105* 108* 113*  CREATININE 3.16* 3.46* 3.65*  CALCIUM 8.2* 8.6* 8.5*  GFRNONAA 19* 17* 16*  GFRAA 22* 20* 19*    CBC  Recent Labs Lab 06/29/16 1037 06/30/16 0202  WBC 5.3 5.8  RBC 4.97 4.69  HGB 11.8* 11.1*  HCT 36.8* 34.1*  PLT 63* 61*  MCV 74.0* 72.7*  MCH 23.7* 23.7*   MCHC 32.1 32.6  RDW 19.0* 18.9*  LYMPHSABS 0.7  --   MONOABS 0.3  --   EOSABS 0.1  --   BASOSABS 0.0  --     HEMOGLOBIN A1C Lab Results  Component Value Date   HGBA1C 6.2 (H) 06/27/2016   MPG 131 06/27/2016    Cardiac Panel (last 3 results)  Recent Labs  06/27/16 0708 06/27/16 1557 06/27/16 1843  TROPONINI 0.04* 0.04* 0.04*    BNP (last 3 results) No results for input(s): PROBNP in the last 8760 hours.  TSH  Recent Labs  06/29/16 1037  TSH <0.010*    Lipid Panel     Component Value Date/Time   TRIG 133 03/10/2013 0315     Hepatic Function Panel  Recent Labs  06/27/16 0348  PROT 6.1*  ALBUMIN 2.2*  AST 18  ALT 18  ALKPHOS 42  BILITOT 3.0*    Imaging: Imaging results have been reviewed  Cardiac Studies: EKG: EKG 06/28/2016: Sinus rhythm with first-degree AV block at a rate of 70 bpm, biatrial enlargement, nonspecific IVCD, atypical right bundle branch block. Normal QT interval..  Echo: Echocardiogram 06/28/2016: Dilated left ventricle with severe LV systolic dysfunction, 76% EF. Grade 2 diastolic dysfunction. Moderate to severe mitral regurgitation. Moderate left renal enlargement. Moderate right ventricular enlargement. Moderate to severe TR, Mild pulmonary hypertension, PA pressure 37 mmHg.  Assessment/Plan:  1. Acute on chronic systolic and diastolic heart failure involving both right ventricle and left ventricle due to noncompliance  with medications. 2. 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 3. Chronic cor pulmonale secondary to COPD, obesity. 4. 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 5. Acute on chronic kidney disease, secondary to acute decompensated heart failure. Serum creatinine baseline is around 1.6-1.8. 6. Chronic thrombocytopenia, stable 7. Diabetes mellitus type 2 controlled on insulin 8. P. Afib/flutter. Last documented  in 2016.  CHA2DS2-VASCScore: Risk Score  4,  Yearly risk of stroke  4. Recommendation: ASA NO/Anticoagulation Yes 9. NSVT  REC:  Extremely complex patient with multiple medical comorbidities, had To moderate the hospital for greater than 2 years following him on a very close manner in the office.  However he stopped his medications about 2 weeks ago and now he is in acute decompensated heart failure.  I would like to have heart failure team evaluate him given multiple medical issues and to get their opinion regarding management of CHF.  I have started the patient on IV furosemide drip.  Otherwise no changes were done today.  Will need further evaluation of markedly reduced TSH.  Adrian Prows, M.D. 06/30/2016, 4:49 PM [facility] Pager: [phone] Office: [phone] If no answer: [phone]

## 2016-06-30 NOTE — Progress Notes (Signed)
Peripherally Inserted Central Catheter/Midline Placement  The IV Nurse has discussed with the patient and/or persons authorized to consent for the patient, the purpose of this procedure and the potential benefits and risks involved with this procedure.  The benefits include less needle sticks, lab draws from the catheter, and the patient may be discharged home with the catheter. Risks include, but not limited to, infection, bleeding, blood clot (thrombus formation), and puncture of an artery; nerve damage and irregular heartbeat and possibility to perform a PICC exchange if needed/ordered by physician.  Alternatives to this procedure were also discussed.  Bard Power PICC patient education guide, fact sheet on infection prevention and patient information card has been provided to patient /or left at bedside.    PICC/Midline Placement Documentation  PICC Triple Lumen 06/30/16 PICC Right Brachial 45 cm 1 cm (Active)  Indication for Insertion or Continuance of Line Vasoactive infusions 06/30/2016  4:47 PM  Exposed Catheter (cm) 1 cm 06/30/2016  4:47 PM  Site Assessment Clean;Dry;Intact 06/30/2016  4:47 PM  Lumen #1 Status Flushed;Saline locked;Blood return noted 06/30/2016  4:47 PM  Lumen #2 Status Flushed;Saline locked;Blood return noted 06/30/2016  4:47 PM  Lumen #3 Status Flushed;Saline locked;Blood return noted 06/30/2016  4:47 PM  Dressing Type Transparent;Securing device 06/30/2016  4:47 PM  Dressing Status Clean;Dry;Intact;Antimicrobial disc in place 06/30/2016  4:47 PM  Dressing Change Due 07/07/16 06/30/2016  4:47 PM       Frances Maywood 06/30/2016, 5:02 PM
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
## 2016-07-01 LAB — CBC
HCT: 34 % — ABNORMAL LOW (ref 39.0–52.0)
HEMATOCRIT: 32.3 % — AB (ref 39.0–52.0)
Hemoglobin: 10.6 g/dL — ABNORMAL LOW (ref 13.0–17.0)
Hemoglobin: 10.3 g/dL — ABNORMAL LOW (ref 13.0–17.0)
MCH: 23.5 pg — ABNORMAL LOW (ref 26.0–34.0)
MCH: 23.4 pg — AB (ref 26.0–34.0)
MCHC: 31.2 g/dL (ref 30.0–36.0)
MCHC: 31.9 g/dL (ref 30.0–36.0)
MCV: 75.4 fL — AB (ref 78.0–100.0)
MCV: 73.4 fL — AB (ref 78.0–100.0)
PLATELETS: 55 10*3/uL — AB (ref 150–400)
Platelets: 52 10*3/uL — ABNORMAL LOW (ref 150–400)
RBC: 4.51 MIL/uL (ref 4.22–5.81)
RBC: 4.4 MIL/uL (ref 4.22–5.81)
RDW: 20.4 % — ABNORMAL HIGH (ref 11.5–15.5)
RDW: 18.7 % — AB (ref 11.5–15.5)
WBC: 6.2 10*3/uL (ref 4.0–10.5)
WBC: 6.3 10*3/uL (ref 4.0–10.5)

## 2016-07-01 LAB — BASIC METABOLIC PANEL
ANION GAP: 10 (ref 5–15)
BUN: 113 mg/dL — AB (ref 6–20)
CO2: 27 mmol/L (ref 22–32)
Calcium: 8.3 mg/dL — ABNORMAL LOW (ref 8.9–10.3)
Chloride: 98 mmol/L — ABNORMAL LOW (ref 101–111)
Creatinine, Ser: 3.66 mg/dL — ABNORMAL HIGH (ref 0.61–1.24)
GFR, EST AFRICAN AMERICAN: 19 mL/min — AB (ref >60)
GFR, EST NON AFRICAN AMERICAN: 16 mL/min — AB (ref >60)
Glucose, Bld: 134 mg/dL — ABNORMAL HIGH (ref 65–99)
Potassium: 4.1 mmol/L (ref 3.5–5.1)
SODIUM: 135 mmol/L (ref 135–145)

## 2016-07-01 LAB — APTT: APTT: 91 s — AB (ref 24–36)

## 2016-07-01 LAB — COOXEMETRY PANEL
CARBOXYHEMOGLOBIN: 1.6 % — AB (ref 0.5–1.5)
CARBOXYHEMOGLOBIN: 1.7 % — AB (ref 0.5–1.5)
Carboxyhemoglobin: 1.6 % — ABNORMAL HIGH (ref 0.5–1.5)
METHEMOGLOBIN: 1 % (ref 0.0–1.5)
METHEMOGLOBIN: 1.1 % (ref 0.0–1.5)
Methemoglobin: 1.2 % (ref 0.0–1.5)
O2 SAT: 49.4 %
O2 Saturation: 49.8 %
O2 Saturation: 64.1 %
TOTAL HEMOGLOBIN: 10.5 g/dL — AB (ref 12.0–16.0)
Total hemoglobin: 10.7 g/dL — ABNORMAL LOW (ref 12.0–16.0)
Total hemoglobin: 10.7 g/dL — ABNORMAL LOW (ref 12.0–16.0)

## 2016-07-01 LAB — PROTEIN ELECTROPHORESIS, SERUM
A/G RATIO SPE: 0.7 (ref 0.7–1.7)
ALBUMIN ELP: 2.5 g/dL — AB (ref 2.9–4.4)
ALPHA-1-GLOBULIN: 0.3 g/dL (ref 0.0–0.4)
ALPHA-2-GLOBULIN: 0.5 g/dL (ref 0.4–1.0)
BETA GLOBULIN: 1.7 g/dL — AB (ref 0.7–1.3)
Gamma Globulin: 1.3 g/dL (ref 0.4–1.8)
Globulin, Total: 3.7 g/dL (ref 2.2–3.9)
M-Spike, %: 0.7 g/dL — ABNORMAL HIGH
Total Protein ELP: 6.2 g/dL (ref 6.0–8.5)

## 2016-07-01 LAB — GLUCOSE, CAPILLARY
GLUCOSE-CAPILLARY: 150 mg/dL — AB (ref 65–99)
Glucose-Capillary: 113 mg/dL — ABNORMAL HIGH (ref 65–99)
Glucose-Capillary: 162 mg/dL — ABNORMAL HIGH (ref 65–99)
Glucose-Capillary: 112 mg/dL — ABNORMAL HIGH (ref 65–99)

## 2016-07-01 LAB — HEPARIN LEVEL (UNFRACTIONATED)

## 2016-07-01 LAB — COMPLEMENT, TOTAL: Compl, Total (CH50): 45 U/mL (ref 42–60)

## 2016-07-01 LAB — T3, FREE: T3 FREE: 2.4 pg/mL (ref 2.0–4.4)

## 2016-07-01 MED ORDER — MILRINONE LACTATE IN DEXTROSE 20-5 MG/100ML-% IV SOLN
0.3750 ug/kg/min | INTRAVENOUS | Status: DC
  Administered 2016-07-02 – 2016-07-08 (×17): 0.375 ug/kg/min via INTRAVENOUS
  Filled 2016-07-01 (×19): qty 100

## 2016-07-01 MED ORDER — AMIODARONE HCL 200 MG PO TABS
200.0000 mg | ORAL_TABLET | Freq: Two times a day (BID) | ORAL | Status: DC
  Administered 2016-07-01 – 2016-07-10 (×18): 200 mg via ORAL
  Filled 2016-07-01 (×18): qty 1

## 2016-07-01 MED ORDER — ALUM & MAG HYDROXIDE-SIMETH 200-200-20 MG/5ML PO SUSP
15.0000 mL | ORAL | Status: DC | PRN
  Administered 2016-07-01: 15 mL via ORAL
  Filled 2016-07-01: qty 30

## 2016-07-01 MED ORDER — METOLAZONE 5 MG PO TABS
5.0000 mg | ORAL_TABLET | Freq: Once | ORAL | Status: AC
  Administered 2016-07-01: 5 mg via ORAL
  Filled 2016-07-01: qty 1
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
## 2016-07-01 NOTE — Care Management Note (Signed)
Case Management Note Original Note created by Olga Coaster 06/27/16  Patient Details  Name: Caleb Bauer MRN: [id number] Date of Birth: 10-26-1950  Subjective/Objective:      Admitted with CHF             Action/Plan: Patient lives at home alone PCP: [name], MD; has private insurance with Medicare/ Medicaid with prescription drug coverage; pharmacy of choice is [facility], his medication is delivered to his home; CM talked to patient about his compliancy with his medication; patient stated " I took a break but I will not do that again."He has scales at home and knows to weigh himself daily; cooks a heart healthy diet with no salt. For transportation he use the bus or a cab; Patient is very pleasant without any complaints with very colorful fingernails; awaiting on PT eval for disposition needs  Expected Discharge Date:  06/30/16               Expected Discharge Plan:  [facility] possibly  Discharge planning Services  CM Consult  Status of Service:  In process, will continue to follow   07/01/2016 Pt is on SD - on Continuous IV lasix, milrinone and Heparin.   [name] [phone] 07/01/2016, 8:59 AM

## 2016-07-01 NOTE — Progress Notes (Signed)
ANTICOAGULATION CONSULT NOTE - Follow Up Consult  Pharmacy Consult for Heparin (Apixaban on hold) Indication: atrial fibrillation  Allergies  Allergen Reactions  . Other Nausea And Vomiting    Patient stated drug is "kerein"? Formulation, dose, indication?   Patient Measurements: Height: 6' (182.9 cm) (per pt previously documented 6' 2: was incorrect) Weight: 226 lb 9.6 oz (102.8 kg) IBW/kg (Calculated) : 77.6  Vital Signs: Temp: 97.7 F (36.5 C) (05/01 1113) Temp Source: Oral (05/01 1113) BP: 113/78 (05/01 1113) Pulse Rate: 84 (05/01 1113)  Labs:  Recent Labs  06/29/16 1037 06/30/16 0202 06/30/16 1133 06/30/16 1747 07/01/16 0426 07/01/16 0621 07/01/16 0700  HGB 11.8* 11.1*  --   --  10.6*  --   --   HCT 36.8* 34.1*  --   --  34.0*  --   --   PLT 63* 61*  --   --  55*  --   --   APTT 49* 103* 98*  --   --  91*  --   LABPROT 33.5*  --   --   --   --   --   --   INR 3.20  --   --   --   --   --   --   HEPARINUNFRC  --  >2.20*  --   --   --  >2.20*  --   CREATININE  --  3.65*  --  3.70*  --   --  3.66*    Estimated Creatinine Clearance: 25 mL/min (A) (by C-G formula based on SCr of 3.66 mg/dL (H)).  Assessment: On heparin while apixaban on hold in setting of worsening renal function.   Heparin level >2.2 (d/t apixaban), however aPTT is within therapeutic range today, no adjustment needed. No bleeding issues noted, hgb stable. Platelet count is low at baseline now down in 50s, will follow closely.   Goal of Therapy:  Heparin level 0.3-0.7 units/ml aPTT 66-102 seconds Monitor platelets by anticoagulation protocol: Yes   Plan:  -Continue heparin at 1300 units/hr -Daily aptt, HL, CBC  Erin Hearing PharmD., BCPS Clinical Pharmacist Pager [phone] 07/01/2016 11:57 AM

## 2016-07-02 DIAGNOSIS — I110 Hypertensive heart disease with heart failure: Secondary | ICD-10-CM

## 2016-07-02 DIAGNOSIS — T462X5A Adverse effect of other antidysrhythmic drugs, initial encounter: Secondary | ICD-10-CM

## 2016-07-02 DIAGNOSIS — I5023 Acute on chronic systolic (congestive) heart failure: Secondary | ICD-10-CM

## 2016-07-02 DIAGNOSIS — E669 Obesity, unspecified: Secondary | ICD-10-CM | POA: Diagnosis present

## 2016-07-02 DIAGNOSIS — I4892 Unspecified atrial flutter: Secondary | ICD-10-CM

## 2016-07-02 DIAGNOSIS — E064 Drug-induced thyroiditis: Secondary | ICD-10-CM

## 2016-07-02 DIAGNOSIS — I5043 Acute on chronic combined systolic (congestive) and diastolic (congestive) heart failure: Secondary | ICD-10-CM

## 2016-07-02 DIAGNOSIS — N179 Acute kidney failure, unspecified: Secondary | ICD-10-CM

## 2016-07-02 LAB — COOXEMETRY PANEL
Carboxyhemoglobin: 2.2 % — ABNORMAL HIGH (ref 0.5–1.5)
METHEMOGLOBIN: 0.8 % (ref 0.0–1.5)
O2 Saturation: 77.5 %
TOTAL HEMOGLOBIN: 9.8 g/dL — AB (ref 12.0–16.0)

## 2016-07-02 LAB — GLUCOSE, CAPILLARY
GLUCOSE-CAPILLARY: 128 mg/dL — AB (ref 65–99)
Glucose-Capillary: 145 mg/dL — ABNORMAL HIGH (ref 65–99)
Glucose-Capillary: 125 mg/dL — ABNORMAL HIGH (ref 65–99)
Glucose-Capillary: 173 mg/dL — ABNORMAL HIGH (ref 65–99)

## 2016-07-02 LAB — CBC
HCT: 29.9 % — ABNORMAL LOW (ref 39.0–52.0)
Hemoglobin: 9.6 g/dL — ABNORMAL LOW (ref 13.0–17.0)
MCH: 23.4 pg — AB (ref 26.0–34.0)
MCHC: 32.1 g/dL (ref 30.0–36.0)
MCV: 72.7 fL — AB (ref 78.0–100.0)
Platelets: 52 10*3/uL — ABNORMAL LOW (ref 150–400)
RBC: 4.11 MIL/uL — AB (ref 4.22–5.81)
RDW: 18.6 % — ABNORMAL HIGH (ref 11.5–15.5)
WBC: 6.1 10*3/uL (ref 4.0–10.5)

## 2016-07-02 LAB — RENAL FUNCTION PANEL
ALBUMIN: 1.8 g/dL — AB (ref 3.5–5.0)
Anion gap: 12 (ref 5–15)
BUN: 116 mg/dL — ABNORMAL HIGH (ref 6–20)
CHLORIDE: 94 mmol/L — AB (ref 101–111)
CO2: 27 mmol/L (ref 22–32)
CREATININE: 3.56 mg/dL — AB (ref 0.61–1.24)
Calcium: 8.4 mg/dL — ABNORMAL LOW (ref 8.9–10.3)
GFR calc non Af Amer: 17 mL/min — ABNORMAL LOW (ref >60)
GFR, EST AFRICAN AMERICAN: 19 mL/min — AB (ref >60)
Glucose, Bld: 186 mg/dL — ABNORMAL HIGH (ref 65–99)
PHOSPHORUS: 3.7 mg/dL (ref 2.5–4.6)
Potassium: 3.8 mmol/L (ref 3.5–5.1)
Sodium: 133 mmol/L — ABNORMAL LOW (ref 135–145)

## 2016-07-02 LAB — MAGNESIUM: MAGNESIUM: 2.1 mg/dL (ref 1.7–2.4)

## 2016-07-02 MED ORDER — PANTOPRAZOLE SODIUM 40 MG PO TBEC
40.0000 mg | DELAYED_RELEASE_TABLET | Freq: Every day | ORAL | Status: DC
  Administered 2016-07-02 – 2016-07-14 (×13): 40 mg via ORAL
  Filled 2016-07-02 (×13): qty 1

## 2016-07-02 MED ORDER — METOLAZONE 5 MG PO TABS
5.0000 mg | ORAL_TABLET | Freq: Once | ORAL | Status: AC
  Administered 2016-07-02: 5 mg via ORAL
  Filled 2016-07-02: qty 1

## 2016-07-02 NOTE — Progress Notes (Addendum)
Physical Therapy Treatment Patient Details Name: Caleb Bauer MRN: [id number] DOB: 1950/05/26 Today's Date: 07/02/2016    History of Present Illness Patient is a 66 yo male admitted 06/27/16 with SOB and LE edema/cellulitis.  Patient with CHF, acute on CKD, and LE wounds/blisters.     PMH:  BLE cellulitis to blisters and skin loss, Lt posterior leg wound, COPD, DM, HTN, CHF, NICM, EF 20%, obesity, CKD    PT Comments    Mod assist for supine to sit, mod assist for sit to stand from commode, pt ambulated 13' x 2 with RW. SaO2 dropped to 75% on RA with walking (pt had removed nasal cannulua), 93% on 3L O2 with 2 min rest. Pt is from ALF but stated he doesn't receive assistance with ADLs there. At present functional level, he needs assistance with bed mobility and transfers, may need ST-SNF for rehab if he's not independent with mobility at time of DC, case manager notified of change in follow up recommendations. .    Follow Up Recommendations  Supervision for mobility/OOB;SNF     Equipment Recommendations  Home O2   Recommendations for Other Services       Precautions / Restrictions Precautions Precautions: Fall Precaution Comments: monitor SaO2 Restrictions Weight Bearing Restrictions: No Other Position/Activity Restrictions: Keep LE's elevated for edema reduction    Mobility  Bed Mobility Overal bed mobility: Needs Assistance Bed Mobility: Supine to Sit;Rolling;Sidelying to Sit Rolling: Mod assist Sidelying to sit: Min assist;HOB elevated       General bed mobility comments: HOB up 40*, assist to pivot hips with pad, VCs technique  Transfers Overall transfer level: Needs assistance Equipment used: Rolling walker (2 wheeled) Transfers: Sit to/from Stand Sit to Stand: Min assist;From elevated surface;Mod assist         General transfer comment: Mod assist and 3 attempts from commode using grab bar, min A from elevated bed, VCs hand  placement  Ambulation/Gait Ambulation/Gait assistance: Min guard Ambulation Distance (Feet): 26 Feet (13' x 2) Assistive device: Rolling walker (2 wheeled) Gait Pattern/deviations: Step-through pattern;Decreased stride length;Decreased dorsiflexion - right;Wide base of support Gait velocity: decreased Gait velocity interpretation: Below normal speed for age/gender General Gait Details: decreased velocity, no LOB, SaO2 dropped to 75% on RA, 93% on 3L [place]   Stairs            Wheelchair Mobility    Modified Rankin (Stroke Patients Only)       Balance Overall balance assessment: Needs assistance Sitting-balance support: No upper extremity supported;Feet supported Sitting balance-Leahy Scale: Good     Standing balance support: Single extremity supported Standing balance-Leahy Scale: Poor Standing balance comment: requires single UE support                            Cognition Arousal/Alertness: Awake/alert Behavior During Therapy: WFL for tasks assessed/performed Overall Cognitive Status: Within Functional Limits for tasks assessed                                        Exercises      General Comments        Pertinent Vitals/Pain Pain Score: 8  Pain Location: BLEs Pain Descriptors / Indicators: Sore Pain Intervention(s): Monitored during session;Repositioned (pt declined pain meds)    Home Living  Prior Function            PT Goals (current goals can now be found in the care plan section) Acute Rehab PT Goals Patient Stated Goal: To decrease pain. To return home. PT Goal Formulation: With patient Time For Goal Achievement: 07/04/16 Potential to Achieve Goals: Good Progress towards PT goals: Progressing toward goals    Frequency    Min 3X/week      PT Plan Discharge plan needs to be updated    Co-evaluation              AM-PAC PT "6 Clicks" Daily Activity  Outcome Measure   Difficulty turning over in bed (including adjusting bedclothes, sheets and blankets)?: A Little Difficulty moving from lying on back to sitting on the side of the bed? : A Lot Difficulty sitting down on and standing up from a chair with arms (e.g., wheelchair, bedside commode, etc,.)?: A Lot Help needed moving to and from a bed to chair (including a wheelchair)?: A Little Help needed walking in hospital room?: A Little Help needed climbing 3-5 steps with a railing? : A Lot 6 Click Score: 15    End of Session Equipment Utilized During Treatment: Gait belt;Oxygen Activity Tolerance: Patient tolerated treatment well;Patient limited by fatigue Patient left: with call bell/phone within reach;in chair Nurse Communication: Mobility status PT Visit Diagnosis: Unsteadiness on feet (R26.81);Other abnormalities of gait and mobility (R26.89);Muscle weakness (generalized) (M62.81);Pain Pain - Right/Left:  (both) Pain - part of body: Leg     Time: 1017-1101 PT Time Calculation (min) (ACUTE ONLY): 44 min  Charges:  $Gait Training: 8-22 mins $Therapeutic Activity: 23-37 mins                    G Codes:          Philomena Doheny 07/02/2016, 11:16 AM [phone]

## 2016-07-02 NOTE — NC FL2 (Signed)
[place] LEVEL OF CARE SCREENING TOOL     IDENTIFICATION  Patient Name: Caleb Bauer Birthdate: Apr 13, 1950 Sex: male Admission Date (Current Location): 06/27/2016  [facility] and [place] Number:  Herbalist and Address:  The . [facility], [place] [address], [place], [place] [address]      Provider Number: [id number]  Attending Physician Name and Address:  Venetia Maxon Rama, MD  Relative Name and Phone Number:       Current Level of Care: Hospital Recommended Level of Care: [place] Prior Approval Number:    Date Approved/Denied: 07/02/16 PASRR Number: [id number]  Discharge Plan: SNF    Current Diagnoses: Patient Active Problem List   Diagnosis Date Noted  . Obesity (BMI 30.0-34.9) 07/02/2016  . AKI (acute kidney injury) ([place])   . Ventricular tachycardia, nonsustained ([place])   . Amiodarone-induced thyroiditis   . 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 Pressure injury of skin 06/28/2016  . Chest pain 06/27/2016  . Acute on chronic combined systolic (congestive) and diastolic (congestive) heart failure ([place]) 06/27/2016  . Bilateral leg edema 06/27/2016  . Hx of colonic polyps   . Recurrent ventral hernia with incarceration s/p lap repair with mesh 05/30/2015 05/30/2015  . Partial small bowel obstruction s/p lap adhesiolysis 05/30/2015 05/30/2015  . Incarcerated incisional hernia 05/30/2015  . S/P laparoscopic hernia repair 05/30/2015  . Sleep apnea   . Anasarca 07/26/2014  . Chronic gouty arthritis 12/21/2013  . Acute renal failure superimposed on stage 3 chronic kidney disease ([place]) 09/12/2013  . Acute gouty arthritis 07/12/2013  . Cardiorenal syndrome with renal failure 04/09/2013  . Acute combined systolic and diastolic CHF, NYHA class 2 ([place]) 04/07/2013  . Hypokalemia 04/07/2013  . Acute respiratory failure ([place]) 03/10/2013  . Gout   . Iron deficiency anemia 12/14/2012  . GERD  (gastroesophageal reflux disease)   . Atrial flutter by electrocardiogram ([place])   . Physical deconditioning 11/30/2012  . COPD (chronic obstructive pulmonary disease) ([place])   . History of ventricular tachycardia 09/17/2012  . Abnormal transaminases 09/17/2012  . Hypertensive heart disease   . Diabetes mellitus without complication ([place])     Orientation RESPIRATION BLADDER Height & Weight     Self, Time, Situation, Place  Normal External catheter, Incontinent Weight: 223 lb 12.3 oz (101.5 kg) Height:  6' (182.9 cm) (per pt previously documented 6' 2: was incorrect)  BEHAVIORAL SYMPTOMS/MOOD NEUROLOGICAL BOWEL NUTRITION STATUS      Continent Diet (See DC Summary)  AMBULATORY STATUS COMMUNICATION OF NEEDS Skin   Extensive Assist Verbally PU Stage and Appropriate Care (left sacrum, partial thickness, Foam Dressin)   PU Stage 2 Dressing:  (PRN Dressing Change)                   Personal Care Assistance Level of Assistance  Bathing, Dressing, Feeding Bathing Assistance: Limited assistance Feeding assistance: Limited assistance Dressing Assistance: Limited assistance     Functional Limitations Info  Sight, Hearing, Speech Sight Info: Adequate Hearing Info: Adequate Speech Info: Adequate    SPECIAL CARE FACTORS FREQUENCY                       Contractures      Additional Factors Info  Code Status, Allergies, Insulin Sliding Scale Code Status Info: FULL Code Allergies Info: drug is "kerein" per patient   Insulin Sliding Scale Info: 9 units 3x's a day  Current Medications (07/02/2016):  This is the current hospital active medication list Current Facility-Administered Medications  Medication Dose Route Frequency Provider Last Rate Last Dose  . 0.9 %  sodium chloride infusion  250 mL Intravenous PRN Radene Gunning, NP      . acetaminophen (TYLENOL) tablet 650 mg  650 mg Oral Q4H PRN Lezlie Octave Black, NP      . albuterol (PROVENTIL) (2.5 MG/3ML) 0.083% nebulizer  solution 2.5 mg  2.5 mg Nebulization Q6H PRN Lezlie Octave Black, NP      . albuterol (PROVENTIL) (2.5 MG/3ML) 0.083% nebulizer solution 3 mL  3 mL Inhalation BID Lavina Hamman, MD   3 mL at 07/02/16 0741  . allopurinol (ZYLOPRIM) tablet 100 mg  100 mg Oral Daily Lezlie Octave Black, NP   100 mg at 07/02/16 0940  . alum & mag hydroxide-simeth (MAALOX/MYLANTA) 200-200-20 MG/5ML suspension 15 mL  15 mL Oral Q4H PRN Lavina Hamman, MD   15 mL at 07/01/16 2026  . amiodarone (PACERONE) tablet 200 mg  200 mg Oral BID Larey Dresser, MD   200 mg at 07/02/16 0940  . bisacodyl (DULCOLAX) suppository 10 mg  10 mg Rectal Daily PRN Vianne Bulls, MD      . furosemide (LASIX) 250 mg in dextrose 5 % 250 mL (1 mg/mL) infusion  20 mg/hr Intravenous Continuous Larey Dresser, MD 20 mL/hr at 07/02/16 1300 20 mg/hr at 07/02/16 1300  . guaiFENesin (MUCINEX) 12 hr tablet 600 mg  600 mg Oral BID Lavina Hamman, MD   600 mg at 07/02/16 0940  . guaiFENesin-dextromethorphan (ROBITUSSIN DM) 100-10 MG/5ML syrup 5 mL  5 mL Oral Q4H PRN Elwin Mocha, MD   5 mL at 06/27/16 2138  . insulin aspart (novoLOG) injection 0-9 Units  0-9 Units Subcutaneous TID WC Radene Gunning, NP   1 Units at 07/02/16 1242  . MEDLINE mouth rinse  15 mL Mouth Rinse BID Lavina Hamman, MD   15 mL at 07/02/16 1000  . methimazole (TAPAZOLE) tablet 10 mg  10 mg Oral TID Deboraha Sprang, MD   10 mg at 07/02/16 0940  . metolazone (ZAROXOLYN) tablet 5 mg  5 mg Oral Once Larey Dresser, MD      . milrinone (PRIMACOR) 20 MG/100 ML (0.2 mg/mL) infusion  0.375 mcg/kg/min Intravenous Continuous Larey Dresser, MD 11.6 mL/hr at 07/02/16 1300 0.375 mcg/kg/min at 07/02/16 1300  . ondansetron (ZOFRAN) injection 4 mg  4 mg Intravenous Q6H PRN Radene Gunning, NP   4 mg at 07/01/16 2053  . pantoprazole (PROTONIX) EC tablet 40 mg  40 mg Oral Q0600 Venetia Maxon Rama, MD   40 mg at 07/02/16 1242  . polyethylene glycol (MIRALAX / GLYCOLAX) packet 17 g  17 g Oral Daily Vianne Bulls, MD   17 g at 07/02/16 0940  . predniSONE (DELTASONE) tablet 40 mg  40 mg Oral QAC breakfast Deboraha Sprang, MD   40 mg at 07/02/16 1027  . sodium chloride flush (NS) 0.9 % injection 10-40 mL  10-40 mL Intracatheter Q12H Lavina Hamman, MD   10 mL at 07/02/16 0942  . sodium chloride flush (NS) 0.9 % injection 10-40 mL  10-40 mL Intracatheter PRN Lavina Hamman, MD      . sodium chloride flush (NS) 0.9 % injection 3 mL  3 mL Intravenous Q12H Radene Gunning, NP   3 mL at 07/02/16 0943  .  sodium chloride flush (NS) 0.9 % injection 3 mL  3 mL Intravenous PRN Radene Gunning, NP      . traMADol Veatrice Bourbon) tablet 50 mg  50 mg Oral Q6H PRN Radene Gunning, NP   50 mg at 06/27/16 1100     Discharge Medications: Please see discharge summary for a list of discharge medications.  Relevant Imaging Results:  Relevant Lab Results:   Additional Information [id number]  Normajean Baxter, LCSW

## 2016-07-02 NOTE — Progress Notes (Signed)
Advanced Heart Failure Rounding Note  PCP:  Primary Cardiologist: Dr Einar Gip  Subjective:    [name] is a 66 year old with a history of DMII, HTN, HF, COPD, former ETOH abuse (stopped drinking 2014), noncompliance, and systolic heart failure admitted with volume overload in the setting on medication noncompliance.    Yesterday milrinone was increased to 0.375 mcg. Remains on lasix drip 15 mg per hour. CVP 17. UOP remains sluggish. Platelets unchanged 52.   Denies SOB/CP.   Objective:   Weight Range: 223 lb 12.3 oz (101.5 kg) Body mass index is 30.35 kg/m.   Vital Signs:   Temp:  [97.6 F (36.4 C)-98.7 F (37.1 C)] 98.1 F (36.7 C) (05/02 0723) Pulse Rate:  [84-91] 87 (05/02 0723) Resp:  [17-26] 19 (05/02 0723) BP: (103-117)/(65-78) 114/72 (05/02 0723) SpO2:  [86 %-97 %] 92 % (05/02 0742) Weight:  [223 lb 12.3 oz (101.5 kg)] 223 lb 12.3 oz (101.5 kg) (05/02 0600) Last BM Date: 06/30/16  Weight change: Filed Weights   06/30/16 0237 07/01/16 0500 07/02/16 0600  Weight: 225 lb 8.5 oz (102.3 kg) 226 lb 9.6 oz (102.8 kg) 223 lb 12.3 oz (101.5 kg)    Intake/Output:   Intake/Output Summary (Last 24 hours) at 07/02/16 0805 Last data filed at 07/01/16 1700  Gross per 24 hour  Intake           582.76 ml  Output             1100 ml  Net          -517.24 ml     Physical Exam: CVP 17 General:  Well appearing. No resp difficulty. In bed.  HEENT: normal Neck: supple. JVP to jaw Carotids 2+ bilat; no bruits. No lymphadenopathy or thryomegaly appreciated. Cor: PMI nondisplaced. Regular rate & rhythm. No rubs or murmurs. + s3  Lungs: clear Abdomen: soft, nontender, nondistended. No hepatosplenomegaly. No bruits or masses. Good bowel sounds. Extremities: no cyanosis, clubbing, rash, R and LLE compression wraps. 2+ edema Neuro: alert & orientedx3, cranial nerves grossly intact. moves all 4 extremities w/o difficulty. Affect pleasant  Telemetry: NSR with PVCS 80-90s .  Personally reviewed.  Labs: CBC  Recent Labs  06/29/16 1037  07/01/16 1445 07/02/16 0458  WBC 5.3  < > 6.3 6.1  NEUTROABS 4.2  --   --   --   HGB 11.8*  < > 10.3* 9.6*  HCT 36.8*  < > 32.3* 29.9*  MCV 74.0*  < > 73.4* 72.7*  PLT 63*  < > 52* 52*  < > = values in this interval not displayed. Basic Metabolic Panel  Recent Labs  06/30/16 0202  07/01/16 0700 07/02/16 0458  NA 135  < > 135 133*  K 3.8  < > 4.1 3.8  CL 97*  < > 98* 94*  CO2 25  < > 27 27  GLUCOSE 113*  < > 134* 186*  BUN 113*  < > 113* 116*  CREATININE 3.65*  < > 3.66* 3.56*  CALCIUM 8.5*  < > 8.3* 8.4*  MG 1.7  --   --  2.1  PHOS  --   --   --  3.7  < > = values in this interval not displayed. Liver Function Tests  Recent Labs  06/30/16 1747 07/02/16 0458  AST 15  --   ALT 14*  --   ALKPHOS 78  --   BILITOT 2.8*  --   PROT 5.7*  --  ALBUMIN 1.9* 1.8*   No results for input(s): LIPASE, AMYLASE in the last 72 hours. Cardiac Enzymes No results for input(s): CKTOTAL, CKMB, CKMBINDEX, TROPONINI in the last 72 hours.  BNP: BNP (last 3 results)  Recent Labs  06/27/16 0349  BNP >4,500.0*    ProBNP (last 3 results) No results for input(s): PROBNP in the last 8760 hours.   D-Dimer No results for input(s): DDIMER in the last 72 hours. Hemoglobin A1C No results for input(s): HGBA1C in the last 72 hours. Fasting Lipid Panel No results for input(s): CHOL, HDL, LDLCALC, TRIG, CHOLHDL, LDLDIRECT in the last 72 hours. Thyroid Function Tests  Recent Labs  06/29/16 1037 06/30/16 1712  TSH <0.010*  --   T3FREE  --  2.4    Other results:     Imaging/Studies:  No results found.    Medications:     Scheduled Medications: . albuterol  3 mL Inhalation BID  . allopurinol  100 mg Oral Daily  . amiodarone  200 mg Oral BID  . guaiFENesin  600 mg Oral BID  . insulin aspart  0-9 Units Subcutaneous TID WC  . mouth rinse  15 mL Mouth Rinse BID  . methimazole  10 mg Oral TID  .  pantoprazole  40 mg Oral Q0600  . polyethylene glycol  17 g Oral Daily  . predniSONE  40 mg Oral QAC breakfast  . sodium chloride flush  10-40 mL Intracatheter Q12H  . sodium chloride flush  3 mL Intravenous Q12H    Infusions: . sodium chloride    . furosemide (LASIX) infusion 15 mg/hr (07/02/16 0700)  . milrinone 0.375 mcg/kg/min (07/02/16 0700)    PRN Medications: sodium chloride, acetaminophen, albuterol, alum & mag hydroxide-simeth, bisacodyl, guaiFENesin-dextromethorphan, ondansetron (ZOFRAN) IV, sodium chloride flush, sodium chloride flush, traMADol   Assessment/Plan   1. Acute on chronic systolic CHF: Echo 5/39 with EF 20%, moderately dilated RV, moderate-severe MR and TR. Low output HF with creatinine up considerably.  Volume status remains elevated, CVP 17. Sluggish urine output.  Co-ox now improved at 77.5%.   - Continue milrinone 0.375 mcg/kg/min.  - Increase Lasix gtt to 20 mg/hr and will give metolazone 5 mg bid today.   - No bb with low output. No Ace/Arb/Dig/spiro with AKI.  - Suspect he would be a poor candidate for advanced therapies. He lives alone and has been noncompliant, also with significant AKI.  2. Hyperthyroidism: New diagnosis. ? Related to amio.   - Continue prednisone and methimazole as treatment of amiodarone-induced hyperthyroidism.  3. NSVT: NSVT and PVCs on milrinone.  Per EP, no definite benefit at this point to stopping amiodarone and may make hyperthyroidism worse by increasing conversion of T4=>T3.  Therefore, have elected not to use mexiletine and to keep him on amiodarone.  - Continue amio 200 mg twice a day.   - Hyperthyroidism treated with prednisone and methimazole.  4. Atrial fibrillation:  Maintaining NSR with PVCs.  - Continue amio as above.   - Hold Eliquis with AKI.  - Heparin gtt held with fall in platelets < 60 K.  Will restart when platelets trend up.    5. Thrombocytopenia:  Doubt HIT as this preceded admission, looking back he has  had thrombocytopenia since 3/17. Platelets 52K today.  Suspect that the fall is reactive in the setting of baseline thrombocytopenia.  6. AKI on CKD stage 3: Minimal change in renal function. Nephrology following along. If no he continues to diurese poorly, may need a course  of CVVH.   Length of Stay: [place], NP  07/02/2016, 8:05 AM  Advanced Heart Failure Team Pager [phone] (M-F; 7a - 4p)  Please contact [facility] for night-coverage after hours (4p -7a ) and weekends on [email or web address]  Patient seen with NP, agree with the above note.  CVP remains high.  Co-ox adequate on current dose of milrinone.  Still sluggish UOP. Creatinine stably elevated.  - Increase Lasix gtt to 20 mg/hr and give metolazone 5 mg bid today.   - If he continues to diurese poorly, may need short course CVVH.   Treating hyperthyroidism with prednisone and methimazole.  Continue amiodarone as discussed above.   Loralie Champagne 07/02/2016 9:34 AM

## 2016-07-02 NOTE — Care Management Note (Signed)
Case Management Note Original Note created by Olga Coaster 06/27/16  Patient Details  Name: Caleb Bauer MRN: [id number] Date of Birth: 10-Jul-1950  Subjective/Objective:      Admitted with CHF             Action/Plan: Patient lives at home alone PCP: [name], MD; has private insurance with Medicare/ Medicaid with prescription drug coverage; pharmacy of choice is [facility], his medication is delivered to his home; CM talked to patient about his compliancy with his medication; patient stated " I took a break but I will not do that again."He has scales at home and knows to weigh himself daily; cooks a heart healthy diet with no salt. For transportation he use the bus or a cab; Patient is very pleasant without any complaints with very colorful fingernails; awaiting on PT eval for disposition needs  Expected Discharge Date:  06/30/16               Expected Discharge Plan:  [place] possibly  Discharge planning Services  CM Consult  Status of Service:  In process, will continue to follow   07/02/2016  SNF recommended- CSW following   07/01/16 Pt is on SD - on Continuous IV lasix, milrinone and Heparin.   Maryclare Labrador, [place] [phone] 07/02/2016, 2:04 PM
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
## 2016-07-02 NOTE — Progress Notes (Signed)
SATURATION QUALIFICATIONS: (This note is used to comply with regulatory documentation for home oxygen)  Patient Saturations on Room Air at Rest = 90%  Patient Saturations on Room Air while Ambulating = 75%  Patient Saturations on -- Liters of oxygen while Ambulating = to be determined  Please briefly explain why patient needs home oxygen: to maintain SaO2 greater than 90% with activity.   Blondell Reveal Kistler PT 07/02/2016  [phone]

## 2016-07-02 NOTE — Progress Notes (Signed)
CARDIAC REHAB PHASE I   PRE:  Rate/Rhythm: 80 SR    BP: sitting 111/69    SaO2: ? 84 RA-pt off O2 but probe not on finger well either  MODE:  Ambulation: stood from recliner, got cleaned, pivot to bed   POST:  Rate/Rhythm: 92 SR    BP: sitting 114/80     SaO2: 94 3L on ear.   Pt wet with urine upon arrival, wanting to get cleaned up. He was thinking about walking after he got cleaned up however when his niece came, he declined and was distracted by conversation with her while being cleaned. He was able to stand mostly independently, stand by assist. Used x2 assist for safety and gait belt. Pt denied fatigue or other c/o. Pivoted to bed. Mod assist to get legs in bed.  Pt felt good after being cleaned and in bed. Sts he will walk tomorrow. [place], ACSM 07/02/2016 2:04 PM
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
## 2016-07-03 DIAGNOSIS — R600 Localized edema: Secondary | ICD-10-CM

## 2016-07-03 LAB — RENAL FUNCTION PANEL
ALBUMIN: 1.9 g/dL — AB (ref 3.5–5.0)
Anion gap: 13 (ref 5–15)
BUN: 120 mg/dL — AB (ref 6–20)
CHLORIDE: 92 mmol/L — AB (ref 101–111)
CO2: 27 mmol/L (ref 22–32)
CREATININE: 3.44 mg/dL — AB (ref 0.61–1.24)
Calcium: 8.7 mg/dL — ABNORMAL LOW (ref 8.9–10.3)
GFR calc Af Amer: 20 mL/min — ABNORMAL LOW (ref >60)
GFR, EST NON AFRICAN AMERICAN: 17 mL/min — AB (ref >60)
Glucose, Bld: 129 mg/dL — ABNORMAL HIGH (ref 65–99)
Phosphorus: 3.9 mg/dL (ref 2.5–4.6)
Potassium: 3.7 mmol/L (ref 3.5–5.1)
Sodium: 132 mmol/L — ABNORMAL LOW (ref 135–145)

## 2016-07-03 LAB — CBC
HEMATOCRIT: 29.3 % — AB (ref 39.0–52.0)
Hemoglobin: 9.4 g/dL — ABNORMAL LOW (ref 13.0–17.0)
MCH: 23.5 pg — ABNORMAL LOW (ref 26.0–34.0)
MCHC: 32.1 g/dL (ref 30.0–36.0)
MCV: 73.3 fL — AB (ref 78.0–100.0)
PLATELETS: 54 10*3/uL — AB (ref 150–400)
RBC: 4 MIL/uL — AB (ref 4.22–5.81)
RDW: 18.8 % — ABNORMAL HIGH (ref 11.5–15.5)
WBC: 5.3 10*3/uL (ref 4.0–10.5)

## 2016-07-03 LAB — COOXEMETRY PANEL
CARBOXYHEMOGLOBIN: 2 % — AB (ref 0.5–1.5)
METHEMOGLOBIN: 0.9 % (ref 0.0–1.5)
O2 Saturation: 74.6 %
Total hemoglobin: 9.5 g/dL — ABNORMAL LOW (ref 12.0–16.0)

## 2016-07-03 LAB — GLUCOSE, CAPILLARY
GLUCOSE-CAPILLARY: 111 mg/dL — AB (ref 65–99)
GLUCOSE-CAPILLARY: 163 mg/dL — AB (ref 65–99)
Glucose-Capillary: 135 mg/dL — ABNORMAL HIGH (ref 65–99)
Glucose-Capillary: 118 mg/dL — ABNORMAL HIGH (ref 65–99)

## 2016-07-03 LAB — MAGNESIUM: MAGNESIUM: 2 mg/dL (ref 1.7–2.4)

## 2016-07-03 MED ORDER — FUROSEMIDE 10 MG/ML IJ SOLN
160.0000 mg | Freq: Two times a day (BID) | INTRAVENOUS | Status: DC
  Administered 2016-07-03: 160 mg via INTRAVENOUS
  Filled 2016-07-03: qty 10

## 2016-07-03 MED ORDER — FUROSEMIDE 10 MG/ML IJ SOLN
160.0000 mg | Freq: Four times a day (QID) | INTRAVENOUS | Status: DC
  Administered 2016-07-03 – 2016-07-06 (×11): 160 mg via INTRAVENOUS
  Filled 2016-07-03: qty 10
  Filled 2016-07-03 (×3): qty 16
  Filled 2016-07-03 (×2): qty 10
  Filled 2016-07-03: qty 16
  Filled 2016-07-03: qty 4
  Filled 2016-07-03 (×2): qty 16
  Filled 2016-07-03 (×2): qty 10

## 2016-07-03 MED ORDER — METOLAZONE 5 MG PO TABS
5.0000 mg | ORAL_TABLET | Freq: Two times a day (BID) | ORAL | Status: DC
  Administered 2016-07-03 – 2016-07-06 (×7): 5 mg via ORAL
  Filled 2016-07-03 (×7): qty 1

## 2016-07-03 NOTE — Progress Notes (Signed)
Wound care dressing change provided to bil low ext per order at this time.

## 2016-07-03 NOTE — Progress Notes (Signed)
Patient ID: Caleb Bauer, male   DOB: 1950-04-27, 66 y.o.   MRN: [id number]     Advanced Heart Failure Rounding Note   Subjective:    Caleb Bauer is a 66 year old with a history of DMII, HTN, HF, COPD, former ETOH abuse (stopped drinking 2014), noncompliance, and systolic heart failure admitted with volume overload and AKI in the setting on medication noncompliance.    Milrinone remains at 0.375 mcg/kg/min. He is on Lasix gtt 20 mg/hr and got metolazone 5 mg bid yesterday. CVP 17 still. Weight has not come down any.  Some UOP but not vigorous.  Good co-ox at 75%.  Oxygen saturation dropped to 75% yesterday with exertion, PT recommend SNF.   BUN/creatinine remain elevated.   Patient is in atrial fibrillation in 90s-100s this morning.   Denies SOB/CP.   Objective:   Weight Range: 227 lb 1.2 oz (103 kg) Body mass index is 30.8 kg/m.   Vital Signs:   Temp:  [97.4 F (36.3 C)-98.1 F (36.7 C)] 97.4 F (36.3 C) (05/03 0733) Pulse Rate:  [58-90] 58 (05/03 0733) Resp:  [16-28] 19 (05/03 0733) BP: (97-128)/(62-78) 114/72 (05/03 0733) SpO2:  [75 %-100 %] 99 % (05/03 0733) Weight:  [227 lb 1.2 oz (103 kg)] 227 lb 1.2 oz (103 kg) (05/03 0356) Last BM Date: 06/28/16  Weight change: Filed Weights   07/01/16 0500 07/02/16 0600 07/03/16 0356  Weight: 226 lb 9.6 oz (102.8 kg) 223 lb 12.3 oz (101.5 kg) 227 lb 1.2 oz (103 kg)    Intake/Output:   Intake/Output Summary (Last 24 hours) at 07/03/16 0741 Last data filed at 07/03/16 0700  Gross per 24 hour  Intake          1408.11 ml  Output             1950 ml  Net          -541.89 ml     Physical Exam: CVP 17 General:  Well appearing. No resp difficulty. In bed.  HEENT: normal Neck: supple. JVP 16+ cm. Cor: PMI nondisplaced. Irregular rate & rhythm. No rubs or murmurs. + s3  Lungs: clear Abdomen: soft, nontender, nondistended. No hepatosplenomegaly. No bruits or masses. Good bowel sounds. Extremities: no cyanosis, clubbing, rash, R  and LLE compression wraps. 1+ edema to knees bilaterally.  Neuro: alert & orientedx3, cranial nerves grossly intact. moves all 4 extremities w/o difficulty. Affect pleasant  Telemetry: Atrial fibrillation 90s-100s. Personally reviewed.  Labs: CBC  Recent Labs  07/02/16 0458 07/03/16 0359  WBC 6.1 5.3  HGB 9.6* 9.4*  HCT 29.9* 29.3*  MCV 72.7* 73.3*  PLT 52* 54*   Basic Metabolic Panel  Recent Labs  07/02/16 0458 07/03/16 0359  NA 133* 132*  K 3.8 3.7  CL 94* 92*  CO2 27 27  GLUCOSE 186* 129*  BUN 116* 120*  CREATININE 3.56* 3.44*  CALCIUM 8.4* 8.7*  MG 2.1 2.0  PHOS 3.7 3.9   Liver Function Tests  Recent Labs  06/30/16 1747 07/02/16 0458 07/03/16 0359  AST 15  --   --   ALT 14*  --   --   ALKPHOS 78  --   --   BILITOT 2.8*  --   --   PROT 5.7*  --   --   ALBUMIN 1.9* 1.8* 1.9*   No results for input(s): LIPASE, AMYLASE in the last 72 hours. Cardiac Enzymes No results for input(s): CKTOTAL, CKMB, CKMBINDEX, TROPONINI in the last 72  hours.  BNP: BNP (last 3 results)  Recent Labs  06/27/16 0349  BNP >4,500.0*    ProBNP (last 3 results) No results for input(s): PROBNP in the last 8760 hours.   D-Dimer No results for input(s): DDIMER in the last 72 hours. Hemoglobin A1C No results for input(s): HGBA1C in the last 72 hours. Fasting Lipid Panel No results for input(s): CHOL, HDL, LDLCALC, TRIG, CHOLHDL, LDLDIRECT in the last 72 hours. Thyroid Function Tests  Recent Labs  06/30/16 1712  T3FREE 2.4    Other results:     Imaging/Studies:  No results found.    Medications:     Scheduled Medications: . albuterol  3 mL Inhalation BID  . allopurinol  100 mg Oral Daily  . amiodarone  200 mg Oral BID  . guaiFENesin  600 mg Oral BID  . insulin aspart  0-9 Units Subcutaneous TID WC  . mouth rinse  15 mL Mouth Rinse BID  . methimazole  10 mg Oral TID  . metolazone  5 mg Oral BID  . pantoprazole  40 mg Oral Q0600  . polyethylene  glycol  17 g Oral Daily  . predniSONE  40 mg Oral QAC breakfast  . sodium chloride flush  10-40 mL Intracatheter Q12H  . sodium chloride flush  3 mL Intravenous Q12H    Infusions: . sodium chloride    . furosemide    . milrinone 0.375 mcg/kg/min (07/03/16 0324)    PRN Medications: sodium chloride, acetaminophen, albuterol, alum & mag hydroxide-simeth, bisacodyl, guaiFENesin-dextromethorphan, ondansetron (ZOFRAN) IV, sodium chloride flush, sodium chloride flush, traMADol   Assessment/Plan   1. Acute on chronic systolic CHF: Nonischemic cardiomyopathy.  Echo 4/18 with EF 20%, moderately dilated RV, moderate-severe Caleb and TR. Low output HF with creatinine up considerably.  He remains volume overloaded, CVP 17. Weight has not changed in the hospital.  Some UOP but not vigorous in setting of aggressive diuretic regimen.  Co-ox adequate at 75%.   - Continue milrinone 0.375 mcg/kg/min.  - Change Lasix from gtt to 160 mg IV bid.  Will also give metolazone 5 mg bid.   - Will need to consider CVVH at this point as we are making little headways with meds and BUN/creatinine remain high.  - No beta blocker with low output. No Ace/Arb/Dig/spiro with AKI.  - Suspect he would be a poor candidate for advanced therapies. He lives alone and has been noncompliant, also with significant AKI.  2. Hyperthyroidism: New diagnosis. ? Related to amio.   - Continue prednisone and methimazole as treatment of amiodarone-induced hyperthyroidism.  3. NSVT: NSVT and PVCs on milrinone.  Per EP, no definite benefit at this point to stopping amiodarone and may make hyperthyroidism worse by increasing conversion of T4=>T3.  Therefore, have elected not to use mexiletine and to keep him on amiodarone.  - Continue amiodarone 200 mg twice a day.   - Hyperthyroidism treated with prednisone and methimazole.  4. Atrial fibrillation:  Paroxysmal.  He went into atrial fibrillation over the last day, HR in 90s-100s so not out of  control.  Plts remain low at 54K, currently off heparin gtt.  - Would continue po amiodarone.   - Continue to hold heparin with decreased plts and possible need for HD catheter.   - Atrial fibrillation likely driven by milrinone.  Would not push to convert back to NSR while on milrinone unless rate becomes uncontrolled, need to reassess after titrating off milrinone eventually.  5. Thrombocytopenia:  Doubt HIT as  this preceded admission, looking back he has had thrombocytopenia since 3/17. Platelets 54K today.  Suspect that the fall is reactive in the setting of baseline thrombocytopenia.  6. AKI on CKD stage 3: Minimal change in renal function. Nephrology following along. At this point, think we need to consider CVVH.   Length of Stay: 6  Loralie Champagne, MD  07/03/2016, 7:41 AM  Advanced Heart Failure Team Pager [phone] (M-F; 7a - 4p)  Please contact [facility] for night-coverage after hours (4p -7a ) and weekends on [email or web address]

## 2016-07-03 NOTE — Progress Notes (Addendum)
Progress Note    Caleb Bauer  NTI:[id number] DOB: 1950-12-28  DOA: 06/27/2016 PCP: [name], MD    Brief Narrative:   Chief complaint: Follow-up chest pain, shortness of breath and lower extremity edema  Medical records reviewed and are as summarized below:  Caleb Bauer is an 66 y.o. 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 In the ED, he was given 80 mg of Lasix and 324 mg of aspirin and referred to [facility] for further care.  Assessment/Plan:   Principal Problem:   Acute on chronic combined systolic (congestive) and diastolic (congestive) heart failure (HCC)/Bilateral leg edema BNP greater than 4500 on admission. 2-D echo showed an EF of 20% with moderate to severe MR and a moderately dilated RV. Evaluated by cardiologist 06/29/16 and is being followed by the advanced heart failure team. Digoxin restarted at 0.125 mg daily as well as hydralazine and isosorbide dinitrate and Primacor, metolazone and torsemide started per cardiology recommendations. Not felt to be a candidate for ICD or pacemaker due to history of noncompliance and multiple medical comorbidities. PICC line placed 06/30/16 with cardiology following co-oximetry readings. Medications being managed by cardiology with current regimen consisting of amiodarone, IV Lasix infusion, Zaroxolyn, Primacor. Weight up approximately 4 pounds overnight, I/O balance -500 mL. CVP 17. Lasix and metolazone dosed per cardiologist. Likely will need CVVH to remove volume. No hypoxia in the last 24 hours, but remains on  supplemental oxygen. He will likely require home oxygen. Continue cardiac rehab.  Active Problems:   Hypertensive heart disease Blood pressure remains controlled on regimen as noted above.    Diabetes mellitus without complication (HCC) Hemoglobin A1c 6.2%. Currently being managed with insulin sensitive SSI 3 times a day. Blood sugars controlled with a range of 125-173.    COPD (chronic obstructive pulmonary disease) (HCC) Continue Mucinex and bronchodilators.    Atrial flutter by electrocardiogram ([place]) Initially managed with heparin drip, subsequently discontinued secondary to thrombocytopenia.    GERD (gastroesophageal reflux disease) Continue Protonix since the patient is on 40 mg of prednisone daily.    Iron deficiency anemia Hemoglobin stable.    Chronic gouty arthritis Continue allopurinol.    Sleep apnea Patient reports that he uses CPAP at home, so will order daily at bedtime here.    Chest pain Troponin mildly elevated, trend flat. Currently denies chest pain.    Diabetic ulcers Chronic full thickness wound on the left posterior leg with superimposed cellulitis. Evaluated by wound care nurse with recommendations for wound care noted.    AKI (acute kidney injury) (HCC)/stage III CKD Felt to have cardiorenal syndrome with poor cardiac output. Evaluated by nephrology 06/30/16. Continue diuretics and inotropes. ACE inhibitor on hold. Eliquis discontinued secondary to impaired renal function. Creatinine 3.44 today.    Ventricular tachycardia, nonsustained ([place]) Continue to monitor on telemetry. On milrinone.    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    Chronic thrombocytopenia Unclear etiology, but thought to be reactive.    Obesity Body mass index is 30.8 kg/m.   Family Communication/Anticipated D/C  date and  plan/Code Status   DVT prophylaxis: Bilateral Ace wraps. Code Status: Full Code.  Family Communication: No family present at the bedside. Disposition Plan: SNF recommended by PT.   Medical Consultants:    Cardiology/advanced heart failure team  Electrophysiology  Nephrology   Procedures:   PICC line placement   2-D echocardiogram  Left ventricle: The cavity size was moderately dilated. Wall   thickness was normal. The estimated ejection fraction was 20%.   Diffuse hypokinesis. Doppler parameters are consistent with both   elevated ventricular end-diastolic filling pressure and elevated   left atrial filling pressure. - Mitral valve: There was moderate to severe regurgitation. Valve   area by pressure half-time: 0.85 cm^2. - Left atrium: The atrium was moderately dilated. - Right ventricle: The cavity size was moderately dilated. - Right atrium: The atrium was moderately dilated. - Atrial septum: No defect or patent foramen ovale was identified. - Tricuspid valve: There was moderate-severe regurgitation. - Pulmonary arteries: PA peak pressure: 37 mm Hg (S).  Anti-Infectives:    None  Subjective:   Denies dyspnea although he clearly gets winded when speaking. No chest pain. Has a condom catheter on.  Objective:    Vitals:   07/03/16 0356 07/03/16 0400 07/03/16 0600 07/03/16 0720  BP:  100/65 122/72   Pulse: 84 88    Resp: 16 16 19    Temp: 98 F (36.7 C)     TempSrc: Oral     SpO2: 96% 98%  100%  Weight: 103 kg (227 lb 1.2 oz)     Height:        Intake/Output Summary (Last 24 hours) at 07/03/16 0732 Last data filed at 07/03/16 0700  Gross per 24 hour  Intake          1408.11 ml  Output             1950 ml  Net          -541.89 ml   Filed Weights   07/01/16 0500 07/02/16 0600 07/03/16 0356  Weight: 102.8 kg (226 lb 9.6 oz) 101.5 kg (223 lb 12.3 oz) 103 kg (227 lb 1.2 oz)    Exam: General exam: Appears calm and comfortable.  Respiratory system:  Clear to auscultation. Respiratory effort increased, short of breath with talking. Cardiovascular system: S1 & S2 heard, RRR. + JVD,  rubs, + S4 gallop. Gastrointestinal system: Abdomen is nondistended, soft and nontender. No organomegaly or masses felt. Normal bowel sounds heard. Central nervous system: Alert and oriented. No focal neurological deficits. Extremities: No clubbing, or cyanosis. Unna boots on bilaterally, + thigh edema. Skin: Lower extremity wounds not examined, Ace wrap intact. Psychiatry: Judgement and insight appear impaired. Mood & affect flat.   Data Reviewed:   I have personally reviewed following labs and imaging studies:  Labs: Basic Metabolic Panel:  Recent Labs Lab 06/30/16 0202 06/30/16 1747 07/01/16 0700 07/02/16 0458 07/03/16 0359  NA 135 137 135 133* 132*  K 3.8 3.9 4.1 3.8 3.7  CL 97* 100* 98* 94* 92*  CO2 25 27 27 27 27   GLUCOSE 113* 103* 134* 186* 129*  BUN 113* 113* 113* 116* 120*  CREATININE 3.65* 3.70* 3.66* 3.56* 3.44*  CALCIUM 8.5* 8.2* 8.3* 8.4* 8.7*  MG 1.7  --   --  2.1 2.0  PHOS  --   --   --  3.7 3.9   GFR Estimated Creatinine Clearance: 26.6 mL/min (A) (by C-G formula based on SCr of 3.44 mg/dL (H)).  Liver Function Tests:  Recent Labs Lab 06/27/16 0348 06/30/16 1747 07/02/16 0458 07/03/16 0359  AST 18 15  --   --   ALT 18 14*  --   --   ALKPHOS 66 78  --   --   BILITOT 3.0* 2.8*  --   --   PROT 6.1* 5.7*  --   --   ALBUMIN 2.2* 1.9* 1.8* 1.9*   No results for input(s): LIPASE, AMYLASE in the last 168 hours. No results for input(s): AMMONIA in the last 168 hours. Coagulation profile  Recent Labs Lab 06/29/16 1037  INR 3.20    CBC:  Recent Labs Lab 06/27/16 0348  06/29/16 1037 06/30/16 0202 07/01/16 0426 07/01/16 1445 07/02/16 0458 07/03/16 0359  WBC 4.2  < > 5.3 5.8 6.2 6.3 6.1 5.3  NEUTROABS 2.9  --  4.2  --   --   --   --   --   HGB 11.5*  < > 11.8* 11.1* 10.6* 10.3* 9.6* 9.4*  HCT 35.2*  < > 36.8*  34.1* 34.0* 32.3* 29.9* 29.3*  MCV 72.6*  < > 74.0* 72.7* 75.4* 73.4* 72.7* 73.3*  PLT 68*  < > 63* 61* 55* 52* 52* 54*  < > = values in this interval not displayed. Cardiac Enzymes:  Recent Labs Lab 06/27/16 0348 06/27/16 0708 06/27/16 1557 06/27/16 1843  TROPONINI 0.04* 0.04* 0.04* 0.04*   BNP (last 3 results) No results for input(s): PROBNP in the last 8760 hours. CBG:  Recent Labs Lab 07/01/16 2009 07/02/16 0725 07/02/16 1218 07/02/16 1652 07/02/16 2130  GLUCAP 150* 128* 145* 125* 173*   D-Dimer: No results for input(s): DDIMER in the last 72 hours. Hgb A1c: No results for input(s): HGBA1C in the last 72 hours. Lipid Profile: No results for input(s): CHOL, HDL, LDLCALC, TRIG, CHOLHDL, LDLDIRECT in the last 72 hours. Thyroid function studies:  Recent Labs  06/30/16 1712  T3FREE 2.4   Anemia work up: No results for input(s): VITAMINB12, FOLATE, FERRITIN, TIBC, IRON, RETICCTPCT in the last 72 hours. Sepsis Labs:  Recent Labs Lab 07/01/16 0426 07/01/16 1445 07/02/16 0458 07/03/16 0359  WBC 6.2 6.3 6.1 5.3    Microbiology Recent Results (from the past 240 hour(s))  MRSA PCR Screening     Status: None   Collection Time: 06/29/16 12:32 PM  Result Value Ref Range Status   MRSA by PCR NEGATIVE NEGATIVE Final    Comment:        The GeneXpert MRSA Assay (FDA approved for NASAL specimens only), is one component of a comprehensive MRSA colonization surveillance program. It is not intended to diagnose MRSA infection nor to guide or monitor treatment for MRSA infections.     Radiology: No results found.  Medications:   . albuterol  3 mL Inhalation BID  . allopurinol  100 mg Oral Daily  . amiodarone  200 mg Oral BID  . guaiFENesin  600 mg Oral BID  . insulin aspart  0-9 Units Subcutaneous TID WC  . mouth rinse  15 mL Mouth Rinse BID  . methimazole  10 mg Oral TID  . pantoprazole  40 mg Oral Q0600  . polyethylene glycol  17 g Oral Daily  .  predniSONE  40 mg Oral QAC breakfast  . sodium chloride flush  10-40 mL Intracatheter Q12H  . sodium chloride flush  3 mL Intravenous Q12H   Continuous Infusions: . sodium chloride    . furosemide (LASIX) infusion 20 mg/hr (07/03/16 0324)  .  milrinone 0.375 mcg/kg/min (07/03/16 0324)    Medical decision making is of high complexity and this patient is at high risk of deterioration, therefore this is a level 3 visit.  (> 4 problem points, 2 data points, high risk)    LOS: 6 days   RAMA,CHRISTINA  [facility] Pager [phone]. If unable to reach me by pager, please call my cell phone at [phone].  *Please refer to [email or web address], password [email or web address] to get updated schedule on who will round on this patient, as hospitalists switch teams weekly. If 7PM-7AM, please contact night-coverage at [email or web address], password [email or web address] for any overnight needs.  07/03/2016, 7:32 AM

## 2016-07-03 NOTE — Progress Notes (Signed)
[facility] Progress Note    Assessment/ Plan:    1. AKI on CKD-3 from cardiorenal syndrome: Baseline creatinine 1.6-1.8. AK likely due to hypoperfusion from cardiorenal syndrome and diuretics. FeUrea suggestive for prerenal etiology. Renal ultrasound with increased echogenicity bilaterally otherwise normal..Creatinine slightly decreased at 3.66>3.56>3.44. UO 1.9 L over last 24 hours and Wt down from 226> 223.   Will increase Lasix to 160 mg q6   Agree with Milrinone and Metolazone per cardiology   Strict I and O's with foley  Daily weights  Daily renal function  Will give patient 1 more day with aggressive diuresis, if still no improvement , will do CVVH   2. CHF/NICM: Echo on 4/28 with ejection fraction of 20% and mod-severe MR and moderate retro-dilated RV.   On Milrinone per cardiology  Cardiology and heart failure team on board  4. PAF/PVC's/NSVT: Amiodarone initially discontinued due to toxicity (low TSH) but then restarted. Frequent PVCs and NSVT this morning  Heparin gtt stopped due to thrombocytopenia  Amiodarone 200 mg BID while on Milrinone   5. Anemia and thrombocytopenia: Hgb decreased from 10.6>9.6>9.4. Iron saturation 18%. Plt stable from 55>52>54, Heparin gtt stopped on 5/1. Plts low in the past without Heparin, not HIT.   Could benefit from iron infusion given his cardiac comorbidity  Daily CBC  6. Hypertension: Normotensive today  Continue milrinone   7. Hyperthyroidism: Possible related to amiodarone  Prednisone and methimazole per primary team  Other issues per primary.   Subjective:   Patient upset this morning because he received the wrong breakfast. Otherwise, patient has no complaints. Thinks this shortness of breath is about the same as yesterday.     Objective:   BP 114/72 (BP Location: Left Arm)   Pulse (!) 58   Temp 97.4 F (36.3 C) (Oral)   Resp 19   Ht 6' (1.829 m) Comment: per pt previously documented 6' 2: was  incorrect  Wt 227 lb 1.2 oz (103 kg)   SpO2 99%   BMI 30.80 kg/m   Intake/Output Summary (Last 24 hours) at 07/03/16 0802 Last data filed at 07/03/16 0700  Gross per 24 hour  Intake          1408.11 ml  Output             1950 ml  Net          -541.89 ml   Weight change: 3 lb 4.9 oz (1.5 kg)  Physical Exam:  GEN: Sitting up in bed, in NAD eating breakfast Eyes: conjunctiva without injection, sclera anicteric CVS: Irregular rhythm, rate controlled, S1, S2 and S3 heard, improving pitting edema to his hips  RESP: normal work of breathing,expiratory wheezes and bibasilar crackles bilaterally GI: soft, slightly distended, normal bowel sounds, non tender throughout MSK: Bilateral lower extremity edema, compression wrapps in place PSYCH: euthymic mood with congruent affect  Imaging: No results found.  Labs: BMET  Recent Labs Lab 06/28/16 0249 06/29/16 0630 06/30/16 0202 06/30/16 1747 07/01/16 0700 07/02/16 0458 07/03/16 0359  NA 136 132* 135 137 135 133* 132*  K 3.5 4.2 3.8 3.9 4.1 3.8 3.7  CL 100* 96* 97* 100* 98* 94* 92*  CO2 25 22 25 27 27 27 27   GLUCOSE 134* 85 113* 103* 134* 186* 129*  BUN 105* 108* 113* 113* 113* 116* 120*  CREATININE 3.16* 3.46* 3.65* 3.70* 3.66* 3.56* 3.44*  CALCIUM 8.2* 8.6* 8.5* 8.2* 8.3* 8.4* 8.7*  PHOS  --   --   --   --   --  3.7 3.9   CBC  Recent Labs Lab 06/27/16 0348  06/29/16 1037  07/01/16 0426 07/01/16 1445 07/02/16 0458 07/03/16 0359  WBC 4.2  < > 5.3  < > 6.2 6.3 6.1 5.3  NEUTROABS 2.9  --  4.2  --   --   --   --   --   HGB 11.5*  < > 11.8*  < > 10.6* 10.3* 9.6* 9.4*  HCT 35.2*  < > 36.8*  < > 34.0* 32.3* 29.9* 29.3*  MCV 72.6*  < > 74.0*  < > 75.4* 73.4* 72.7* 73.3*  PLT 68*  < > 63*  < > 55* 52* 52* 54*  < > = values in this interval not displayed.  Medications:    . albuterol  3 mL Inhalation BID  . allopurinol  100 mg Oral Daily  . amiodarone  200 mg Oral BID  . guaiFENesin  600 mg Oral BID  . insulin aspart   0-9 Units Subcutaneous TID WC  . mouth rinse  15 mL Mouth Rinse BID  . methimazole  10 mg Oral TID  . metolazone  5 mg Oral BID  . pantoprazole  40 mg Oral Q0600  . polyethylene glycol  17 g Oral Daily  . predniSONE  40 mg Oral QAC breakfast  . sodium chloride flush  10-40 mL Intracatheter Q12H  . sodium chloride flush  3 mL Intravenous Q12H    Smitty Cords, MD [place], PGY-2 07/03/2016, 8:02 AM

## 2016-07-03 NOTE — Progress Notes (Signed)
Pt is on NIV at this time tolerating it well.  

## 2016-07-03 NOTE — Care Management Note (Signed)
Case Management Note Original Note created by Olga Coaster 06/27/16  Patient Details  Name: Caleb Bauer MRN: [id number] Date of Birth: 03-Sep-1950  Subjective/Objective:      Admitted with CHF             Action/Plan: Patient lives at home alone PCP: [name], MD; has private insurance with Medicare/ Medicaid with prescription drug coverage; pharmacy of choice is [facility], his medication is delivered to his home; CM talked to patient about his compliancy with his medication; patient stated " I took a break but I will not do that again."He has scales at home and knows to weigh himself daily; cooks a heart healthy diet with no salt. For transportation he use the bus or a cab; Patient is very pleasant without any complaints with very colorful fingernails; awaiting on PT eval for disposition needs  Expected Discharge Date:  06/30/16               Expected Discharge Plan:  [place] possibly  Discharge planning Services  CM Consult  Status of Service:  In process, will continue to follow   07/03/2016  Milrinone remains at 0.375 mcg/kg/min. He is on Lasix gtt 20 mg/hr and got metolazone 5 mg bid yesterday. CVP 17 still. Weight has not come down any.  Some UOP but not vigorous. Good co-ox at 75%.  Oxygen saturation dropped to 75% yesterday with exertion, PT recommend SNF. SNF recommended- CSW following   07/01/16 Pt is on SD - on Continuous IV lasix, milrinone and Heparin.   Maryclare Labrador, [place] [phone] 07/03/2016, 8:30 AM

## 2016-07-04 LAB — GLUCOSE, CAPILLARY
Glucose-Capillary: 99 mg/dL (ref 65–99)
Glucose-Capillary: 125 mg/dL — ABNORMAL HIGH (ref 65–99)
Glucose-Capillary: 122 mg/dL — ABNORMAL HIGH (ref 65–99)

## 2016-07-04 LAB — CBC
HEMATOCRIT: 29.7 % — AB (ref 39.0–52.0)
HEMOGLOBIN: 9.7 g/dL — AB (ref 13.0–17.0)
MCH: 24.1 pg — AB (ref 26.0–34.0)
MCHC: 32.7 g/dL (ref 30.0–36.0)
MCV: 73.9 fL — AB (ref 78.0–100.0)
PLATELETS: 55 10*3/uL — AB (ref 150–400)
RBC: 4.02 MIL/uL — AB (ref 4.22–5.81)
RDW: 19.1 % — ABNORMAL HIGH (ref 11.5–15.5)
WBC: 4.9 10*3/uL (ref 4.0–10.5)

## 2016-07-04 LAB — RENAL FUNCTION PANEL
ANION GAP: 13 (ref 5–15)
Albumin: 2 g/dL — ABNORMAL LOW (ref 3.5–5.0)
BUN: 121 mg/dL — AB (ref 6–20)
CO2: 31 mmol/L (ref 22–32)
Calcium: 8.9 mg/dL (ref 8.9–10.3)
Chloride: 91 mmol/L — ABNORMAL LOW (ref 101–111)
Creatinine, Ser: 3.27 mg/dL — ABNORMAL HIGH (ref 0.61–1.24)
GFR calc Af Amer: 21 mL/min — ABNORMAL LOW (ref >60)
GFR calc non Af Amer: 18 mL/min — ABNORMAL LOW (ref >60)
GLUCOSE: 129 mg/dL — AB (ref 65–99)
POTASSIUM: 3.9 mmol/L (ref 3.5–5.1)
Phosphorus: 4.3 mg/dL (ref 2.5–4.6)
SODIUM: 135 mmol/L (ref 135–145)

## 2016-07-04 LAB — COOXEMETRY PANEL
CARBOXYHEMOGLOBIN: 1.3 % (ref 0.5–1.5)
METHEMOGLOBIN: 1.1 % (ref 0.0–1.5)
O2 SAT: 75.4 %
Total hemoglobin: 9.6 g/dL — ABNORMAL LOW (ref 12.0–16.0)

## 2016-07-04 LAB — MAGNESIUM: Magnesium: 1.9 mg/dL (ref 1.7–2.4)

## 2016-07-04 LAB — HEPARIN LEVEL (UNFRACTIONATED): HEPARIN UNFRACTIONATED: 0.38 [IU]/mL (ref 0.30–0.70)

## 2016-07-04 MED ORDER — HEPARIN (PORCINE) IN NACL 100-0.45 UNIT/ML-% IJ SOLN
1450.0000 [IU]/h | INTRAMUSCULAR | Status: DC
  Administered 2016-07-04 – 2016-07-05 (×2): 1300 [IU]/h via INTRAVENOUS
  Administered 2016-07-06 (×2): 1450 [IU]/h via INTRAVENOUS
  Filled 2016-07-04 (×5): qty 250

## 2016-07-04 NOTE — Plan of Care (Signed)
Problem: Safety: Goal: Ability to remain free from injury will improve Outcome: Progressing Pt. understands his physical limits and calls for help when he needs it.

## 2016-07-04 NOTE — Progress Notes (Signed)
Patient ID: CLEVEN JANSMA, male   DOB: [birth year], 66 y.o.   MRN: [id number]     Advanced Heart Failure Rounding Note   Subjective:    Mr Pultz is a 66 year old with a history of DMII, HTN, HF, COPD, former ETOH abuse (stopped drinking 2014), noncompliance, and systolic heart failure admitted with volume overload and AKI in the setting on medication noncompliance.    Coox 75% on milrinone 0.375 mcg/kg/min. Remains in afib 90-100s this am.   Feeling somewhat better this am. Has noticed an increase in his urine output with more frequent dosing. Denies SOB currently. No CP, lightheadedness or dizziness.   Out 2.2 L and weight down 11 lbs with lasix 160 mg IV q 6 hrs per renal. BUN and creatinine remain elevated at 121 and 3.27, respectively.   CVP remains 14-15.  Objective:   Weight Range: 216 lb 7.9 oz (98.2 kg) Body mass index is 29.36 kg/m.   Vital Signs:   Temp:  [97.4 F (36.3 C)-98.3 F (36.8 C)] 97.8 F (36.6 C) (05/04 0721) Pulse Rate:  [83-91] 87 (05/04 0721) Resp:  [15-27] 17 (05/04 0721) BP: (112-124)/(67-90) 118/77 (05/04 0721) SpO2:  [96 %-100 %] 96 % (05/04 0721) Weight:  [216 lb 7.9 oz (98.2 kg)] 216 lb 7.9 oz (98.2 kg) (05/04 0500) Last BM Date: 07/03/16  Weight change: Filed Weights   07/02/16 0600 07/03/16 0356 07/04/16 0500  Weight: 223 lb 12.3 oz (101.5 kg) 227 lb 1.2 oz (103 kg) 216 lb 7.9 oz (98.2 kg)    Intake/Output:   Intake/Output Summary (Last 24 hours) at 07/04/16 0815 Last data filed at 07/04/16 0738  Gross per 24 hour  Intake           560.08 ml  Output             3130 ml  Net         -2569.92 ml     Physical Exam: CVP 14-15 General:  Elderly and fatigued appearing. NAD. Sitting in recliner.  HEENT: Normal. Except for poor dentition.  Neck: Supple. JVP 14+ cm.  Cor: PMI normal. Irregular. No rubs or murmurs. + S3.  Lungs: Slightly diminished basilar sounds.  Abdomen: mildly edematous, NT, ND, no HSM. No bruits or masses. +BS    Extremities: no cyanosis, clubbing, rash, R and LLE compression wraps. 1-2+ edema into thighs.  Neuro: Alert & oriented x 3. Cranial nerves grossly intact. Moves all 4 extremities w/o difficulty. Affect flat but appropriate.  Telemetry: Personally reviewed, In and our of Afib 80-100s.   Labs: CBC  Recent Labs  07/03/16 0359 07/04/16 0257  WBC 5.3 4.9  HGB 9.4* 9.7*  HCT 29.3* 29.7*  MCV 73.3* 73.9*  PLT 54* 55*   Basic Metabolic Panel  Recent Labs  07/03/16 0359 07/04/16 0257  NA 132* 135  K 3.7 3.9  CL 92* 91*  CO2 27 31  GLUCOSE 129* 129*  BUN 120* 121*  CREATININE 3.44* 3.27*  CALCIUM 8.7* 8.9  MG 2.0 1.9  PHOS 3.9 4.3   Liver Function Tests  Recent Labs  07/03/16 0359 07/04/16 0257  ALBUMIN 1.9* 2.0*   No results for input(s): LIPASE, AMYLASE in the last 72 hours. Cardiac Enzymes No results for input(s): CKTOTAL, CKMB, CKMBINDEX, TROPONINI in the last 72 hours.  BNP: BNP (last 3 results)  Recent Labs  06/27/16 0349  BNP >4,500.0*    ProBNP (last 3 results) No results for input(s): PROBNP in the  last 8760 hours.   D-Dimer No results for input(s): DDIMER in the last 72 hours. Hemoglobin A1C No results for input(s): HGBA1C in the last 72 hours. Fasting Lipid Panel No results for input(s): CHOL, HDL, LDLCALC, TRIG, CHOLHDL, LDLDIRECT in the last 72 hours. Thyroid Function Tests No results for input(s): TSH, T4TOTAL, T3FREE, THYROIDAB in the last 72 hours.  Invalid input(s): FREET3  Other results:     Imaging/Studies:  No results found.    Medications:     Scheduled Medications: . albuterol  3 mL Inhalation BID  . allopurinol  100 mg Oral Daily  . amiodarone  200 mg Oral BID  . guaiFENesin  600 mg Oral BID  . insulin aspart  0-9 Units Subcutaneous TID WC  . mouth rinse  15 mL Mouth Rinse BID  . methimazole  10 mg Oral TID  . metolazone  5 mg Oral BID  . pantoprazole  40 mg Oral Q0600  . polyethylene glycol  17 g Oral  Daily  . sodium chloride flush  10-40 mL Intracatheter Q12H  . sodium chloride flush  3 mL Intravenous Q12H    Infusions: . sodium chloride    . furosemide 160 mg (07/04/16 0718)  . milrinone 0.375 mcg/kg/min (07/04/16 0717)    PRN Medications: sodium chloride, acetaminophen, albuterol, alum & mag hydroxide-simeth, bisacodyl, guaiFENesin-dextromethorphan, ondansetron (ZOFRAN) IV, sodium chloride flush, sodium chloride flush, traMADol   Assessment/Plan   1. Acute on chronic systolic CHF: Nonischemic cardiomyopathy.  Echo 4/18 with EF 20%, moderately dilated RV, moderate-severe MR and TR. Low output HF with creatinine up considerably.  He remains volume overloaded, CVP 17. Weight has not changed in the hospital.  Some UOP but not vigorous in setting of aggressive diuretic regimen.   - Coox OK this am at 75.4% on milrinone 0.375 mcg/kg/min.   - Continue milrinone 0.375 mcg/kg/min.  - Currently on lasix 160 mg q 6 hrs per renal. Dosing vs CVVH per renal.  - Renal following for possible CVVHD.  - No beta blocker with low output. No Ace/Arb/Dig/spiro with AKI.  - Suspect he would be a poor candidate for advanced therapies. He lives alone and has been noncompliant, also with significant AKI.  2. Hyperthyroidism: New diagnosis. ? Related to amio.   - Continue prednisone and methimazole as treatment of amiodarone-induced hyperthyroidism. No change.  3. NSVT: NSVT and PVCs on milrinone.  Per EP, no definite benefit at this point to stopping amiodarone and may make hyperthyroidism worse by increasing conversion of T4=>T3.  Therefore, have elected not to use mexiletine and to keep him on amiodarone.  - Continue amio 200 mg BID.  - Hyperthyroidism treated with prednisone and methimazole.  4. Atrial fibrillation:  Paroxysmal.  - He went into atrial fibrillation over the 24-48 hrs. Appears to be in and out with P waves this am Rate control OK. Platelets remain low at 55k.  - Will need to reassess for  rhythm control once off milrinone. Continue po amio as above.  - Plts have been stable in the mid 50K range.  Will restart heparin gtt today.  5. Thrombocytopenia:  Doubt HIT as this preceded admission, looking back he has had thrombocytopenia since 3/17. Platelets 55K today.  Suspect that the fall is reactive in the setting of baseline thrombocytopenia.  6. AKI on CKD stage 3: Minimal change in renal function. - Nephrology following for possible CVVHD.   Length of Stay: [place], [place]  07/04/2016, 8:15 AM  Advanced Heart Failure Team Pager [phone] (M-F; [place])  Please contact [facility] for night-coverage after hours (4p -7a ) and weekends on [email or web address]  Patient seen with PA, agree with the above note.  Diuretics increased yesterday.  Though he remains volume overloaded, he had better UOP.  CVP remains elevated and co-ox remains adequate.  BUN/creatinine fairly stable.  - Continue current Lasix and metolazone today.  No indication for CVVH yet.   He is back in NSR this morning, confirm with ECG.  Continue amiodarone 200 mg bid as above.  Platelets have remained stable in the mid 50K range.  Will restart heparin gtt for anticoagulation for now. If no HD catheter ends up being needed, will eventually need to go back on oral anticoagulation.    Loralie Champagne 07/04/2016 1:27 PM

## 2016-07-04 NOTE — Progress Notes (Signed)
Progress Note    Caleb Bauer  GGE:[id number] DOB: 1950/07/14  DOA: 06/27/2016 PCP: [name], MD    Brief Narrative:   Chief complaint: Follow-up chest pain, shortness of breath and lower extremity edema  Medical records reviewed and are as summarized below:  Caleb Bauer is an 66 y.o. 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 In the ED, he was given 80 mg of Lasix and 324 mg of aspirin and referred to [facility] for further care.  Assessment/Plan:   Principal Problem:   Acute on chronic combined systolic (congestive) and diastolic (congestive) heart failure (HCC)/Bilateral leg edema BNP greater than 4500 on admission. 2-D echo showed an EF of 20% with moderate to severe MR and a moderately dilated RV. Evaluated by cardiologist 06/29/16 and is being followed by the advanced heart failure team. Digoxin restarted at 0.125 mg daily as well as hydralazine and isosorbide dinitrate and Primacor, metolazone and torsemide started per cardiology recommendations. Not felt to be a candidate for ICD or pacemaker due to history of noncompliance and multiple medical comorbidities. PICC line placed 06/30/16 with cardiology following co-oximetry readings. Medications being managed by cardiology with current regimen consisting of amiodarone, IV Lasix infusion, Zaroxolyn, Primacor. Weight down 11 pounds overnight, I/O balance -2231 mL on Lasix 160 mg every 6 hours, Zaroxolyn 5 mg twice a day. Lasix and metolazone dosed per cardiologist. No hypoxia in the last 48 hours, but remains on  supplemental oxygen. He will likely require home oxygen. Continue cardiac rehab.  Active Problems:   Hypertensive heart disease Blood pressure remains controlled on regimen as noted above.    Diabetes mellitus without complication (HCC) Hemoglobin A1c 6.2%. Currently being managed with insulin sensitive SSI 3 times a day. Blood sugars controlled with a range of 99-163.    COPD (chronic obstructive pulmonary disease) (HCC) Continue Mucinex and bronchodilators.    Atrial flutter by electrocardiogram ([place]) Initially managed with heparin drip, subsequently discontinued secondary to thrombocytopenia.    GERD (gastroesophageal reflux disease) Continue Protonix since the patient is on 40 mg of prednisone daily.    Iron deficiency anemia Hemoglobin stable, 9.7 mg/dL today.    Chronic gouty arthritis Continue allopurinol.    Sleep apnea CPAP ordered daily at bedtime.    Chest pain Troponin mildly elevated, trend flat. Currently denies chest pain.    Diabetic ulcers Chronic full thickness wound on the left posterior leg with superimposed cellulitis. Evaluated by wound care nurse with recommendations for wound care noted.    AKI (acute kidney injury) (HCC)/stage III CKD Felt to have cardiorenal syndrome with poor cardiac output. Evaluated by nephrology 06/30/16. Continue diuretics and inotropes. ACE inhibitor on hold. Eliquis discontinued secondary to impaired renal function. Creatinine 3.27 today.    Ventricular tachycardia, nonsustained ([place]) Continue to monitor on telemetry. On milrinone.    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    Chronic thrombocytopenia Unclear etiology, but thought to be reactive. Platelet count remains in the 50s.    Obesity Body mass index is 29.36 kg/m.   Family  Communication/Anticipated D/C date and  plan/Code Status   DVT prophylaxis: Bilateral Ace wraps. Code Status: Full Code.  Family Communication: No family present at the bedside. Disposition Plan: SNF recommended by PT.   Medical Consultants:    Cardiology/advanced heart failure team  Electrophysiology  Nephrology   Procedures:   PICC line placement   2-D echocardiogram  Left ventricle: The cavity size was moderately dilated. Wall   thickness was normal. The estimated ejection fraction was 20%.   Diffuse hypokinesis. Doppler parameters are consistent with both   elevated ventricular end-diastolic filling pressure and elevated   left atrial filling pressure. - Mitral valve: There was moderate to severe regurgitation. Valve   area by pressure half-time: 0.85 cm^2. - Left atrium: The atrium was moderately dilated. - Right ventricle: The cavity size was moderately dilated. - Right atrium: The atrium was moderately dilated. - Atrial septum: No defect or patent foramen ovale was identified. - Tricuspid valve: There was moderate-severe regurgitation. - Pulmonary arteries: PA peak pressure: 37 mm Hg (S).  Anti-Infectives:    None  Subjective:   Feels breathing is better.  No chest pain or tightness. Appetite is good.  Bowels moving OK.    Objective:    Vitals:   07/04/16 0400 07/04/16 0500 07/04/16 0600 07/04/16 0721  BP: 117/76  115/81 118/77  Pulse: 86  88 87  Resp: (!) 21  19 17   Temp:    97.8 F (36.6 C)  TempSrc:    Oral  SpO2: 99%  98% 96%  Weight:  98.2 kg (216 lb 7.9 oz)    Height:        Intake/Output Summary (Last 24 hours) at 07/04/16 0725 Last data filed at 07/04/16 0700  Gross per 24 hour  Intake           618.73 ml  Output             2850 ml  Net         -2231.27 ml   Filed Weights   07/02/16 0600 07/03/16 0356 07/04/16 0500  Weight: 101.5 kg (223 lb 12.3 oz) 103 kg (227 lb 1.2 oz) 98.2 kg (216 lb 7.9 oz)    Exam: General exam: Appears calm and comfortable. Sitting up in  chair eating breakfast. Respiratory system: Clear to auscultation. Respiratory effort unlabored. Cardiovascular system: S1 & S2 heard, RRR. + mild JVD,  rubs, + S4 gallop. Gastrointestinal system: Abdomen is nondistended, soft and nontender. No organomegaly or masses felt. Normal bowel sounds heard. Central nervous system: Alert and oriented. No focal neurological deficits. Extremities: No clubbing, or cyanosis. Unna boots on bilaterally, + thigh edema. Skin: Lower extremity wounds not examined, Ace wrap intact. Psychiatry: Judgement and insight appear impaired. Mood & affect a bit brighter.   Data Reviewed:   I have personally reviewed following labs and imaging studies:  Labs: Basic Metabolic Panel:  Recent Labs Lab 06/30/16 0202 06/30/16 1747 07/01/16 0700 07/02/16 0458 07/03/16 0359 07/04/16 0257  NA 135 137 135 133* 132* 135  K 3.8 3.9 4.1 3.8 3.7 3.9  CL 97* 100* 98* 94* 92* 91*  CO2 25 27 27 27 27 31   GLUCOSE 113* 103* 134* 186* 129* 129*  BUN 113* 113* 113* 116* 120* 121*  CREATININE 3.65* 3.70* 3.66* 3.56* 3.44* 3.27*  CALCIUM 8.5* 8.2* 8.3* 8.4* 8.7* 8.9  MG 1.7  --   --  2.1 2.0 1.9  PHOS  --   --   --  3.7 3.9  4.3   GFR Estimated Creatinine Clearance: 27.3 mL/min (A) (by C-G formula based on SCr of 3.27 mg/dL (H)). Liver Function Tests:  Recent Labs Lab 06/30/16 1747 07/02/16 0458 07/03/16 0359 07/04/16 0257  AST 15  --   --   --   ALT 14*  --   --   --   ALKPHOS 78  --   --   --   BILITOT 2.8*  --   --   --   PROT 5.7*  --   --   --   ALBUMIN 1.9* 1.8* 1.9* 2.0*   No results for input(s): LIPASE, AMYLASE in the last 168 hours. No results for input(s): AMMONIA in the last 168 hours. Coagulation profile  Recent Labs Lab 06/29/16 1037  INR 3.20    CBC:  Recent Labs Lab 06/29/16 1037  07/01/16 0426 07/01/16 1445 07/02/16 0458 07/03/16 0359 07/04/16 0257  WBC 5.3  < > 6.2 6.3 6.1 5.3 4.9  NEUTROABS 4.2  --   --   --   --   --   --     HGB 11.8*  < > 10.6* 10.3* 9.6* 9.4* 9.7*  HCT 36.8*  < > 34.0* 32.3* 29.9* 29.3* 29.7*  MCV 74.0*  < > 75.4* 73.4* 72.7* 73.3* 73.9*  PLT 63*  < > 55* 52* 52* 54* 55*  < > = values in this interval not displayed. Cardiac Enzymes:  Recent Labs Lab 06/27/16 1557 06/27/16 1843  TROPONINI 0.04* 0.04*   BNP (last 3 results) No results for input(s): PROBNP in the last 8760 hours. CBG:  Recent Labs Lab 07/03/16 0732 07/03/16 1136 07/03/16 1523 07/03/16 2225 07/04/16 0719  GLUCAP 111* 135* 118* 163* 99    Microbiology Recent Results (from the past 240 hour(s))  MRSA PCR Screening     Status: None   Collection Time: 06/29/16 12:32 PM  Result Value Ref Range Status   MRSA by PCR NEGATIVE NEGATIVE Final    Comment:        The GeneXpert MRSA Assay (FDA approved for NASAL specimens only), is one component of a comprehensive MRSA colonization surveillance program. It is not intended to diagnose MRSA infection nor to guide or monitor treatment for MRSA infections.     Radiology: No results found.  Medications:   . albuterol  3 mL Inhalation BID  . allopurinol  100 mg Oral Daily  . amiodarone  200 mg Oral BID  . guaiFENesin  600 mg Oral BID  . insulin aspart  0-9 Units Subcutaneous TID WC  . mouth rinse  15 mL Mouth Rinse BID  . methimazole  10 mg Oral TID  . metolazone  5 mg Oral BID  . pantoprazole  40 mg Oral Q0600  . polyethylene glycol  17 g Oral Daily  . sodium chloride flush  10-40 mL Intracatheter Q12H  . sodium chloride flush  3 mL Intravenous Q12H   Continuous Infusions: . sodium chloride    . furosemide 160 mg (07/04/16 0718)  . milrinone 0.375 mcg/kg/min (07/04/16 1638)    Medical decision making is of high complexity and this patient is at high risk of deterioration, therefore this is a level 3 visit.  (> 4 problem points, 2 data points, high risk)    LOS: 7 days   RAMA,CHRISTINA  [facility] Pager [phone]. If unable to reach me by  pager, please call my cell phone at [phone].  *Please refer to [email or web address], password [email or web address] to get updated  schedule on who will round on this patient, as hospitalists switch teams weekly. If 7PM-7AM, please contact night-coverage at [email or web address], password [email or web address] for any overnight needs.  07/04/2016, 7:25 AM

## 2016-07-04 NOTE — Progress Notes (Signed)
ANTICOAGULATION CONSULT NOTE - Follow Up Consult  Pharmacy Consult for Heparin (Apixaban on hold) Indication: atrial fibrillation  Allergies  Allergen Reactions  . Other Nausea And Vomiting    Patient stated drug is "kerein"? Formulation, dose, indication?   Patient Measurements: Height: 6' (182.9 cm) (per pt previously documented 6' 2: was incorrect) Weight: 216 lb 7.9 oz (98.2 kg) IBW/kg (Calculated) : 77.6  Vital Signs: Temp: 98.5 F (36.9 C) (05/04 1140) Temp Source: Oral (05/04 1140) BP: 115/76 (05/04 1140) Pulse Rate: 87 (05/04 1140)  Labs:  Recent Labs  07/02/16 0458 07/03/16 0359 07/04/16 0257  HGB 9.6* 9.4* 9.7*  HCT 29.9* 29.3* 29.7*  PLT 52* 54* 55*  CREATININE 3.56* 3.44* 3.27*    Estimated Creatinine Clearance: 27.3 mL/min (A) (by C-G formula based on SCr of 3.27 mg/dL (H)).  Assessment: On heparin while apixaban on hold in setting of worsening renal function.   Heparin infusion stopped 5/1 due to ongoing thrombocytopenia. Pltc up to 55 today and has remained stable, new orders to resume heparin. He will eventually need to go back oral anticoagulation.   His anti-xa level was greatly elevated at >2.2 on 5/1, will need to recheck prior to starting heparin and will tentatively follow aptt for monitoring. Aptt's were at goal on 1300/hr so will restart at that rate, no bolus. Patient noted to be in NSR this am.   Goal of Therapy:  Heparin level 0.3-0.7 units/ml aPTT 66-102 seconds Monitor platelets by anticoagulation protocol: Yes   Plan:  -Restart heparin at 1300 units/hr -Daily aptt, HL, CBC -Check baseline anti-xa level  Erin Hearing PharmD., BCPS Clinical Pharmacist Pager [phone] 07/04/2016 1:42 PM

## 2016-07-04 NOTE — Progress Notes (Signed)
Physical Therapy Treatment Patient Details Name: Caleb Bauer MRN: [id number] DOB: Jul 25, 1950 Today's Date: 07/04/2016    History of Present Illness Patient is a 66 yo male admitted 06/27/16 with SOB and LE edema/cellulitis.  Patient with CHF, acute on CKD, and LE wounds/blisters.     PMH:  BLE cellulitis to blisters and skin loss, Lt posterior leg wound, COPD, DM, HTN, CHF, NICM, EF 20%, obesity, CKD    PT Comments    Pt deferred planned standing exercise in lieu of just ambulating in the hall.   Follow Up Recommendations  Supervision for mobility/OOB;SNF     Equipment Recommendations  None recommended by PT    Recommendations for Other Services       Precautions / Restrictions Precautions Precautions: Fall Precaution Comments: monitor SaO2 Restrictions Other Position/Activity Restrictions: Keep LE's elevated for edema reduction    Mobility  Bed Mobility               General bed mobility comments: sitting in the chair on arrival  Transfers Overall transfer level: Needs assistance Equipment used: Rolling walker (2 wheeled) Transfers: Sit to/from Stand Sit to Stand: Min assist         General transfer comment: steady assist only  Ambulation/Gait Ambulation/Gait assistance: Min guard Ambulation Distance (Feet): 70 Feet Assistive device: Rolling walker (2 wheeled) Gait Pattern/deviations: Step-through pattern Gait velocity: decreased Gait velocity interpretation: Below normal speed for age/gender General Gait Details: steady gait with light use of the RW. On 3L [place] sats maintained in low 90's   Stairs            Wheelchair Mobility    Modified Rankin (Stroke Patients Only)       Balance Overall balance assessment: Needs assistance Sitting-balance support: No upper extremity supported;Feet supported Sitting balance-Leahy Scale: Good       Standing balance-Leahy Scale: Poor Standing balance comment: requires single UE support                             Cognition Arousal/Alertness: Awake/alert Behavior During Therapy: WFL for tasks assessed/performed Overall Cognitive Status: Within Functional Limits for tasks assessed                                        Exercises      General Comments        Pertinent Vitals/Pain Pain Assessment: Faces Faces Pain Scale: No hurt    Home Living                      Prior Function            PT Goals (current goals can now be found in the care plan section) Acute Rehab PT Goals Patient Stated Goal: To decrease pain. To return home. PT Goal Formulation: With patient Time For Goal Achievement: 07/04/16 Potential to Achieve Goals: Good Progress towards PT goals: Progressing toward goals    Frequency    Min 3X/week      PT Plan Discharge plan needs to be updated    Co-evaluation              AM-PAC PT "6 Clicks" Daily Activity  Outcome Measure  Difficulty turning over in bed (including adjusting bedclothes, sheets and blankets)?: A Little Difficulty moving from lying on back to sitting on the side of  the bed? : A Lot Difficulty sitting down on and standing up from a chair with arms (e.g., wheelchair, bedside commode, etc,.)?: A Lot Help needed moving to and from a bed to chair (including a wheelchair)?: A Little Help needed walking in hospital room?: A Little Help needed climbing 3-5 steps with a railing? : A Lot 6 Click Score: 15    End of Session Equipment Utilized During Treatment: Oxygen Activity Tolerance: Patient tolerated treatment well;Patient limited by fatigue Patient left: with call bell/phone within reach;in chair Nurse Communication: Mobility status PT Visit Diagnosis: Unsteadiness on feet (R26.81);Other abnormalities of gait and mobility (R26.89);Muscle weakness (generalized) (M62.81)     Time: 3568-6168 PT Time Calculation (min) (ACUTE ONLY): 14 min  Charges:  $Gait Training: 8-22 mins                     G Codes:       2016-07-18  Caleb Bauer, PT [phone]  (pager)   Caleb Bauer Caleb Bauer Jul 18, 2016, 2:46 PM

## 2016-07-04 NOTE — Progress Notes (Signed)
[facility] Progress Note    Assessment/ Plan:    1. AKI on CKD-3 from cardiorenal syndrome: Baseline creatinine 1.6-1.8. AK likely due to hypoperfusion from cardiorenal syndrome and diuretics. FeUrea suggestive for prerenal etiology. Renal ultrasound with increased echogenicity bilaterally otherwise normal. Creatinine decreasing at 3.56>3.44>3.27. UO 2.8 L over last 24 hours and Wt down from 227> 216.   Continue Lasix 160 mg q6   Agree with Milrinone and Metolazone per cardiology   Strict I and O's   Daily weights  Daily renal function  Patient is improving with downtrending Cr and excellent urine output, will hold off on CRRT for now  2. CHF/NICM: Echo on 4/28 with ejection fraction of 20% and mod-severe MR and moderate retro-dilated RV. Remains on supplemental oxygen 3L.   On Milrinone per cardiology  Cardiology and heart failure team on board  4. PAF/PVC's/NSVT: Amiodarone initially discontinued due to toxicity (low TSH) but then restarted.   Heparin gtt stopped due to thrombocytopenia  Amiodarone 200 mg BID while on Milrinone   Will defer to cardiology team  5. Anemia and thrombocytopenia: Hgb stable at 9.7. Iron saturation 18%. Plt stable from 52>54>55, Heparin gtt stopped on 5/1. Plts low in the past without Heparin, not HIT.   Could benefit from iron infusion given his cardiac comorbidity  Daily CBC  6. Hypertension: Normotensive today  Continue milrinone   7. Hyperthyroidism: Possible related to amiodarone  Prednisone and methimazole per primary team  Other issues per primary.   Subjective:   Patient stable overnight without any complaints. Denies any pain. Notes that he has been urinating a lot since we changed his medications yesterday.    Objective:   BP 118/77 (BP Location: Left Arm)   Pulse 87   Temp 97.8 F (36.6 C) (Oral)   Resp 17   Ht 6' (1.829 m) Comment: per pt previously documented 6' 2: was incorrect  Wt 216 lb 7.9 oz  (98.2 kg)   SpO2 96%   BMI 29.36 kg/m   Intake/Output Summary (Last 24 hours) at 07/04/16 0806 Last data filed at 07/04/16 9735  Gross per 24 hour  Intake           626.08 ml  Output             3130 ml  Net         -2503.92 ml   Weight change: -10 lb 9.3 oz (-4.8 kg)  Physical Exam:  GEN: Sitting up in bed, in NAD eating breakfast Eyes: conjunctiva without injection, sclera anicteric CVS: Regular rate and rhythme, S1, S2 and S3 heard, improving pitting edema to his hips  RESP: normal work of breathing,expiratory wheezes and bibasilar crackles bilaterally GI: soft, slightly distended, normal bowel sounds, non tender throughout MSK: Bilateral lower extremity edema, compression wrapps in place PSYCH: euthymic mood with congruent affect  Imaging: No results found.  Labs: BMET  Recent Labs Lab 06/29/16 0630 06/30/16 0202 06/30/16 1747 07/01/16 0700 07/02/16 0458 07/03/16 0359 07/04/16 0257  NA 132* 135 137 135 133* 132* 135  K 4.2 3.8 3.9 4.1 3.8 3.7 3.9  CL 96* 97* 100* 98* 94* 92* 91*  CO2 22 25 27 27 27 27 31   GLUCOSE 85 113* 103* 134* 186* 129* 129*  BUN 108* 113* 113* 113* 116* 120* 121*  CREATININE 3.46* 3.65* 3.70* 3.66* 3.56* 3.44* 3.27*  CALCIUM 8.6* 8.5* 8.2* 8.3* 8.4* 8.7* 8.9  PHOS  --   --   --   --  3.7 3.9 4.3   CBC  Recent Labs Lab 06/29/16 1037  07/01/16 1445 07/02/16 0458 07/03/16 0359 07/04/16 0257  WBC 5.3  < > 6.3 6.1 5.3 4.9  NEUTROABS 4.2  --   --   --   --   --   HGB 11.8*  < > 10.3* 9.6* 9.4* 9.7*  HCT 36.8*  < > 32.3* 29.9* 29.3* 29.7*  MCV 74.0*  < > 73.4* 72.7* 73.3* 73.9*  PLT 63*  < > 52* 52* 54* 55*  < > = values in this interval not displayed.  Medications:    . albuterol  3 mL Inhalation BID  . allopurinol  100 mg Oral Daily  . amiodarone  200 mg Oral BID  . guaiFENesin  600 mg Oral BID  . insulin aspart  0-9 Units Subcutaneous TID WC  . mouth rinse  15 mL Mouth Rinse BID  . methimazole  10 mg Oral TID  .  metolazone  5 mg Oral BID  . pantoprazole  40 mg Oral Q0600  . polyethylene glycol  17 g Oral Daily  . sodium chloride flush  10-40 mL Intracatheter Q12H  . sodium chloride flush  3 mL Intravenous Q12H    Smitty Cords, MD [place], PGY-2 07/04/2016, 8:06 AM

## 2016-07-04 NOTE — Progress Notes (Signed)
CSW attempted to consult pt on SNF referral and offers, but pt was asleep. Nurse in room could not get pt to wake up for discussion and explained that pt had been asleep the entire time she had been in room without waking.   CSW will attempt to follow up at another time.  Laveda Abbe LCSW [phone]

## 2016-07-04 NOTE — Plan of Care (Signed)
Problem: Activity: Goal: Risk for activity intolerance will decrease Outcome: Progressing Pt. walked today with PT and tolerated the walk well. Pt. got up and sat in the chair for a large portion of the day before heading back to the bed toward the end of the shift.

## 2016-07-04 NOTE — Progress Notes (Signed)
Pt refuse CPAP for the night.  

## 2016-07-05 ENCOUNTER — Encounter (HOSPITAL_COMMUNITY): Payer: Self-pay | Admitting: Internal Medicine

## 2016-07-05 DIAGNOSIS — E871 Hypo-osmolality and hyponatremia: Secondary | ICD-10-CM | POA: Diagnosis present

## 2016-07-05 DIAGNOSIS — R609 Edema, unspecified: Secondary | ICD-10-CM

## 2016-07-05 DIAGNOSIS — I480 Paroxysmal atrial fibrillation: Secondary | ICD-10-CM

## 2016-07-05 DIAGNOSIS — E1122 Type 2 diabetes mellitus with diabetic chronic kidney disease: Secondary | ICD-10-CM

## 2016-07-05 LAB — HEPARIN LEVEL (UNFRACTIONATED)
HEPARIN UNFRACTIONATED: 0.42 [IU]/mL (ref 0.30–0.70)
Heparin Unfractionated: 0.43 IU/mL (ref 0.30–0.70)
Heparin Unfractionated: 0.24 IU/mL — ABNORMAL LOW (ref 0.30–0.70)

## 2016-07-05 LAB — GLUCOSE, CAPILLARY
GLUCOSE-CAPILLARY: 96 mg/dL (ref 65–99)
GLUCOSE-CAPILLARY: 121 mg/dL — AB (ref 65–99)
Glucose-Capillary: 146 mg/dL — ABNORMAL HIGH (ref 65–99)
Glucose-Capillary: 162 mg/dL — ABNORMAL HIGH (ref 65–99)
Glucose-Capillary: 137 mg/dL — ABNORMAL HIGH (ref 65–99)

## 2016-07-05 LAB — RENAL FUNCTION PANEL
ALBUMIN: 2.2 g/dL — AB (ref 3.5–5.0)
Anion gap: 10 (ref 5–15)
BUN: 121 mg/dL — AB (ref 6–20)
CHLORIDE: 88 mmol/L — AB (ref 101–111)
CO2: 34 mmol/L — ABNORMAL HIGH (ref 22–32)
CREATININE: 3.24 mg/dL — AB (ref 0.61–1.24)
Calcium: 8.6 mg/dL — ABNORMAL LOW (ref 8.9–10.3)
GFR, EST AFRICAN AMERICAN: 22 mL/min — AB (ref >60)
GFR, EST NON AFRICAN AMERICAN: 19 mL/min — AB (ref >60)
Glucose, Bld: 104 mg/dL — ABNORMAL HIGH (ref 65–99)
Phosphorus: 3.8 mg/dL (ref 2.5–4.6)
Potassium: 3.4 mmol/L — ABNORMAL LOW (ref 3.5–5.1)
SODIUM: 132 mmol/L — AB (ref 135–145)

## 2016-07-05 LAB — CBC
HCT: 30.7 % — ABNORMAL LOW (ref 39.0–52.0)
Hemoglobin: 9.6 g/dL — ABNORMAL LOW (ref 13.0–17.0)
MCH: 23.3 pg — ABNORMAL LOW (ref 26.0–34.0)
MCHC: 31.3 g/dL (ref 30.0–36.0)
MCV: 74.5 fL — AB (ref 78.0–100.0)
PLATELETS: 56 10*3/uL — AB (ref 150–400)
RBC: 4.12 MIL/uL — AB (ref 4.22–5.81)
RDW: 18.9 % — AB (ref 11.5–15.5)
WBC: 5 10*3/uL (ref 4.0–10.5)

## 2016-07-05 LAB — COOXEMETRY PANEL
Carboxyhemoglobin: 1.4 % (ref 0.5–1.5)
METHEMOGLOBIN: 1.2 % (ref 0.0–1.5)
O2 Saturation: 73.6 %
TOTAL HEMOGLOBIN: 9.4 g/dL — AB (ref 12.0–16.0)

## 2016-07-05 LAB — APTT
APTT: 61 s — AB (ref 24–36)
APTT: 72 s — AB (ref 24–36)

## 2016-07-05 LAB — MAGNESIUM: MAGNESIUM: 1.8 mg/dL (ref 1.7–2.4)

## 2016-07-05 MED ORDER — POTASSIUM CHLORIDE CRYS ER 10 MEQ PO TBCR
10.0000 meq | EXTENDED_RELEASE_TABLET | Freq: Once | ORAL | Status: AC
  Administered 2016-07-05: 10 meq via ORAL
  Filled 2016-07-05: qty 1

## 2016-07-05 MED ORDER — MAGNESIUM SULFATE 2 GM/50ML IV SOLN
2.0000 g | Freq: Once | INTRAVENOUS | Status: AC
  Administered 2016-07-05: 2 g via INTRAVENOUS
  Filled 2016-07-05: qty 50

## 2016-07-05 MED ORDER — POTASSIUM CHLORIDE CRYS ER 10 MEQ PO TBCR
10.0000 meq | EXTENDED_RELEASE_TABLET | Freq: Three times a day (TID) | ORAL | Status: DC
  Administered 2016-07-05 (×3): 10 meq via ORAL
  Filled 2016-07-05 (×4): qty 1

## 2016-07-05 NOTE — Progress Notes (Signed)
ANTICOAGULATION CONSULT NOTE - Follow Up Consult  Pharmacy Consult for Heparin (Apixaban on hold) Indication: atrial fibrillation  Allergies  Allergen Reactions  . Other Nausea And Vomiting    Patient stated drug is "kerein"? Formulation, dose, indication?   Patient Measurements: Height: 6' (182.9 cm) (per pt previously documented 6' 2: was incorrect) Weight: 216 lb 7.9 oz (98.2 kg) IBW/kg (Calculated) : 77.6  Vital Signs: Temp: 97.8 F (36.6 C) (05/05 0019) Temp Source: Oral (05/05 0019) BP: 115/82 (05/05 0019) Pulse Rate: 84 (05/05 0019)  Labs:  Recent Labs  07/02/16 0458 07/03/16 0359 07/04/16 0257 07/04/16 1403 07/05/16 0043 07/05/16 0044  HGB 9.6* 9.4* 9.7*  --   --   --   HCT 29.9* 29.3* 29.7*  --   --   --   PLT 52* 54* 55*  --   --   --   APTT  --   --   --   --  72*  --   HEPARINUNFRC  --   --   --  0.38  --  0.43  CREATININE 3.56* 3.44* 3.27*  --   --   --     Estimated Creatinine Clearance: 27.3 mL/min (A) (by C-G formula based on SCr of 3.27 mg/dL (H)).  Assessment: On heparin while apixaban on hold in setting of worsening renal function.   Heparin infusion stopped 5/1 due to ongoing thrombocytopenia. Pltc up to 55 today and has remained stable, new orders to resume heparin. He will eventually need to go back oral anticoagulation.   Baseline anti-xa level 0.38 so Eliquis likely still affecting. PTT 72 sec (therapeutic) with anti-xa level of 0.43. These appear to be correlating but will verify with next check.  Goal of Therapy:  Heparin level 0.3-0.7 units/ml aPTT 66-102 seconds Monitor platelets by anticoagulation protocol: Yes   Plan:  -Continue heparin at 1300 units/hr -Will f/u 8hr PTT and heparin level to confirm therapeutic  Sherlon Handing, PharmD, BCPS Clinical pharmacist, pager [phone] 07/05/2016 1:22 AM

## 2016-07-05 NOTE — Progress Notes (Addendum)
Progress Note    Caleb Bauer  UKG:[id number] DOB: 29-Oct-1950  DOA: 06/27/2016 PCP: [name], MD    Brief Narrative:   Chief complaint: Follow-up chest pain, shortness of breath and lower extremity edema  Medical records reviewed and are as summarized below:  Caleb Bauer is an 66 y.o. 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 In the ED, he was given 80 mg of Lasix and 324 mg of aspirin and referred to [facility] for further care. BNP greater than 4500 on admission. 2-D echo showed an EF of 20% with moderate to severe MR and a moderately dilated RV. Not felt to be a candidate for ICD or pacemaker due to history of noncompliance and multiple medical comorbidities. PICC line placed 06/30/16 with cardiology following co-oximetry readings.  Assessment/Plan:   Principal Problem:   Acute respiratory failure secondary to Acute on chronic combined systolic (congestive) and diastolic (congestive) heart failure (HCC)/Bilateral leg edema/Anasarca Medications being managed by cardiology with current regimen consisting of amiodarone, Zaroxolyn, Primacor and high dose Lasix. Weight without significant change overnight, I/O balance -1145 mL. CVP 8. Lasix and metolazone dosed per cardiologist, but it appears that there is no further room for up titration, so may need CRRT. No hypoxia in the last 72 hours, but remains on supplemental oxygen. He will likely require home oxygen. Continue cardiac rehab. Breathing OK on supplemental oxygen.  Active Problems:   Hypokalemia Secondary to  aggressive diuresis. Cautious repletion given severe impairment of renal function.    Mild hyponatremia Likely reflective of CHF physiology.    Hypertensive heart disease Blood pressure remains controlled on regimen as noted above: 110s-120s/70s.    Diabetes mellitus without complication (HCC) Hemoglobin A1c 6.2%. Currently being managed with insulin sensitive SSI 3 times a day. Blood sugars controlled with a range of 99-163.    COPD (chronic obstructive pulmonary disease) (HCC) Continue Mucinex and bronchodilators. Remains stable.    Atrial flutter by electrocardiogram ([place]) Initially managed with heparin drip, subsequently discontinued secondary to thrombocytopenia. Resumed 07/04/16. Platelet count remains in the 50s, continue to monitor closely.    GERD (gastroesophageal reflux disease) Continue Protonix since the patient is on 40 mg of prednisone daily.    Iron deficiency anemia Hemoglobin stable, 9.6 mg/dL today.    Chronic gouty arthritis Continue allopurinol.    Sleep apnea Continue CPAP daily at bedtime.    Chest pain Troponin mildly elevated, trend flat. Currently denies chest pain.    Diabetic ulcers Chronic full thickness wound on the left posterior leg with superimposed cellulitis. Evaluated by wound care nurse with recommendations for wound care noted.    AKI (acute kidney injury) (HCC)/stage III CKD/cardiorenal syndrome Felt to have cardiorenal syndrome with poor cardiac output. Evaluated by nephrology 06/30/16. Continue diuretics and inotropes. ACE inhibitor on hold. Eliquis discontinued secondary to impaired renal function. Creatinine 3.24 today.    Ventricular tachycardia, nonsustained ([place]) Continue to monitor on telemetry. On milrinone.    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    Chronic  thrombocytopenia Unclear etiology, but  thought to be reactive. Platelet count remains in the 50s.    Obesity/physical deconditioning Body mass index is 29.33 kg/m. PT/OT, SNF placement when stable.   Family Communication/Anticipated D/C date and plan/Code Status   DVT prophylaxis: Heparin resumed 07/04/16. Code Status: Full Code.  Family Communication: No family present at the bedside. Disposition Plan: SNF recommended by PT.   Medical Consultants:    Cardiology/advanced heart failure team  Electrophysiology  Nephrology   Procedures:   PICC line placement   2-D echocardiogram  Left ventricle: The cavity size was moderately dilated. Wall   thickness was normal. The estimated ejection fraction was 20%.   Diffuse hypokinesis. Doppler parameters are consistent with both   elevated ventricular end-diastolic filling pressure and elevated   left atrial filling pressure. - Mitral valve: There was moderate to severe regurgitation. Valve   area by pressure half-time: 0.85 cm^2. - Left atrium: The atrium was moderately dilated. - Right ventricle: The cavity size was moderately dilated. - Right atrium: The atrium was moderately dilated. - Atrial septum: No defect or patent foramen ovale was identified. - Tricuspid valve: There was moderate-severe regurgitation. - Pulmonary arteries: PA peak pressure: 37 mm Hg (S).  Anti-Infectives:    None  Subjective:   Laying in bed with spilled coffee on gown.  No reports of dyspnea, chest pain, nausea or vomiting.  Says his appetite is good.    Objective:    Vitals:   07/05/16 0200 07/05/16 0400 07/05/16 0437 07/05/16 0500  BP: 117/81 124/84 124/84   Pulse: 88 87 86   Resp: (!) 21 20 16    Temp:   97.5 F (36.4 C)   TempSrc:   Oral   SpO2: 98% 97% 100%   Weight:    98.1 kg (216 lb 4.3 oz)  Height:        Intake/Output Summary (Last 24 hours) at 07/05/16 0714 Last data filed at 07/05/16 0600  Gross per 24 hour  Intake            794.72 ml  Output             1940 ml  Net         -1145.28 ml   Filed Weights   07/03/16 0356 07/04/16 0500 07/05/16 0500  Weight: 103 kg (227 lb 1.2 oz) 98.2 kg (216 lb 7.9 oz) 98.1 kg (216 lb 4.3 oz)    Exam: General exam: Appears calm and comfortable. Had a coughing spell when I was examining him. Respiratory system: Clear to auscultation. Respiratory effort unlabored. Cardiovascular system: S1 & S2 heard, RRR. + mild JVD,  rubs, + S4 gallop. Gastrointestinal system: Abdomen is nondistended, soft and nontender. No organomegaly or masses felt. Normal bowel sounds heard. Central nervous system: Alert and oriented. No focal neurological deficits. Extremities: No clubbing, or cyanosis. Unna boots on bilaterally, + thigh edema. Skin: Lower extremity wounds not examined, Ace wrap intact. Psychiatry: Judgement and insight appear impaired. Mood & affect depressed.   Data Reviewed:   I have personally reviewed following labs and imaging studies:  Labs: Basic Metabolic Panel:  Recent Labs Lab 06/30/16 0202  07/01/16 0700 07/02/16 0458 07/03/16 0359 07/04/16 0257 07/05/16 0445  NA 135  < > 135 133* 132* 135 132*  K 3.8  < > 4.1 3.8 3.7 3.9 3.4*  CL 97*  < > 98* 94* 92* 91* 88*  CO2 25  < > 27 27 27 31  34*  GLUCOSE 113*  < > 134* 186*  129* 129* 104*  BUN 113*  < > 113* 116* 120* 121* 121*  CREATININE 3.65*  < > 3.66* 3.56* 3.44* 3.27* 3.24*  CALCIUM 8.5*  < > 8.3* 8.4* 8.7* 8.9 8.6*  MG 1.7  --   --  2.1 2.0 1.9 1.8  PHOS  --   --   --  3.7 3.9 4.3 3.8  < > = values in this interval not displayed. GFR Estimated Creatinine Clearance: 27.6 mL/min (A) (by C-G formula based on SCr of 3.24 mg/dL (H)). Liver Function Tests:  Recent Labs Lab 06/30/16 1747 07/02/16 0458 07/03/16 0359 07/04/16 0257 07/05/16 0445  AST 15  --   --   --   --   ALT 14*  --   --   --   --   ALKPHOS 78  --   --   --   --   BILITOT 2.8*  --   --   --   --   PROT 5.7*  --   --   --   --    ALBUMIN 1.9* 1.8* 1.9* 2.0* 2.2*   No results for input(s): LIPASE, AMYLASE in the last 168 hours. No results for input(s): AMMONIA in the last 168 hours. Coagulation profile  Recent Labs Lab 06/29/16 1037  INR 3.20    CBC:  Recent Labs Lab 06/29/16 1037  07/01/16 1445 07/02/16 0458 07/03/16 0359 07/04/16 0257 07/05/16 0445  WBC 5.3  < > 6.3 6.1 5.3 4.9 5.0  NEUTROABS 4.2  --   --   --   --   --   --   HGB 11.8*  < > 10.3* 9.6* 9.4* 9.7* 9.6*  HCT 36.8*  < > 32.3* 29.9* 29.3* 29.7* 30.7*  MCV 74.0*  < > 73.4* 72.7* 73.3* 73.9* 74.5*  PLT 63*  < > 52* 52* 54* 55* 56*  < > = values in this interval not displayed. Cardiac Enzymes: No results for input(s): CKTOTAL, CKMB, CKMBINDEX, TROPONINI in the last 168 hours. BNP (last 3 results) No results for input(s): PROBNP in the last 8760 hours. CBG:  Recent Labs Lab 07/03/16 2225 07/04/16 0719 07/04/16 1139 07/04/16 1609 07/05/16 0111  GLUCAP 163* 99 125* 122* 146*    Microbiology Recent Results (from the past 240 hour(s))  MRSA PCR Screening     Status: None   Collection Time: 06/29/16 12:32 PM  Result Value Ref Range Status   MRSA by PCR NEGATIVE NEGATIVE Final    Comment:        The GeneXpert MRSA Assay (FDA approved for NASAL specimens only), is one component of a comprehensive MRSA colonization surveillance program. It is not intended to diagnose MRSA infection nor to guide or monitor treatment for MRSA infections.     Radiology: No results found.  Medications:   . allopurinol  100 mg Oral Daily  . amiodarone  200 mg Oral BID  . guaiFENesin  600 mg Oral BID  . insulin aspart  0-9 Units Subcutaneous TID WC  . mouth rinse  15 mL Mouth Rinse BID  . methimazole  10 mg Oral TID  . metolazone  5 mg Oral BID  . pantoprazole  40 mg Oral Q0600  . polyethylene glycol  17 g Oral Daily  . sodium chloride flush  10-40 mL Intracatheter Q12H  . sodium chloride flush  3 mL Intravenous Q12H   Continuous  Infusions: . sodium chloride    . furosemide Stopped (07/05/16 0359)  . heparin  1,300 Units/hr (07/04/16 1417)  . milrinone 0.375 mcg/kg/min (07/04/16 2227)    Medical decision making is of high complexity and this patient is at high risk of deterioration, therefore this is a level 3 visit.  (> 4 problem points, 2 data points, high risk)    LOS: 8 days   Jaidence Geisler  [facility] Pager [phone]. If unable to reach me by pager, please call my cell phone at [phone].  *Please refer to [email or web address], password [email or web address] to get updated schedule on who will round on this patient, as hospitalists switch teams weekly. If 7PM-7AM, please contact night-coverage at [email or web address], password [email or web address] for any overnight needs.  07/05/2016, 7:14 AM

## 2016-07-05 NOTE — Progress Notes (Signed)
Admit: 06/27/2016 LOS: 8  6M AoCKD4 from cardiorenal syndrome  Subjective:  UOP remains suboptimal on max diuretics as well as on milrinone Weights unchanged CVP 8 this morning   05/04 0701 - 05/05 0700 In: 794.7 [P.O.:180; I.V.:482.7; IV Piggyback:132] Out: 1940 [TSVXB:[id number]]  Filed Weights   07/03/16 0356 07/04/16 0500 07/05/16 0500  Weight: 103 kg (227 lb 1.2 oz) 98.2 kg (216 lb 7.9 oz) 98.1 kg (216 lb 4.3 oz)    Scheduled Meds: . allopurinol  100 mg Oral Daily  . amiodarone  200 mg Oral BID  . guaiFENesin  600 mg Oral BID  . insulin aspart  0-9 Units Subcutaneous TID WC  . mouth rinse  15 mL Mouth Rinse BID  . methimazole  10 mg Oral TID  . metolazone  5 mg Oral BID  . pantoprazole  40 mg Oral Q0600  . polyethylene glycol  17 g Oral Daily  . potassium chloride  10 mEq Oral TID  . sodium chloride flush  10-40 mL Intracatheter Q12H  . sodium chloride flush  3 mL Intravenous Q12H   Continuous Infusions: . sodium chloride    . furosemide 160 mg (07/05/16 0739)  . heparin 1,300 Units/hr (07/05/16 0739)  . milrinone 0.375 mcg/kg/min (07/05/16 0739)   PRN Meds:.sodium chloride, acetaminophen, albuterol, alum & mag hydroxide-simeth, bisacodyl, guaiFENesin-dextromethorphan, ondansetron (ZOFRAN) IV, sodium chloride flush, sodium chloride flush, traMADol  Current Labs: reviewed    Physical Exam:  Blood pressure 110/77, pulse 93, temperature 97.6 F (36.4 C), temperature source Oral, resp. rate 17, height 6' (1.829 m), weight 98.1 kg (216 lb 4.3 oz), SpO2 95 %. NAD, speaks in full sentences EOMI Regular rate and rhythm Continues to have edema throughout legs as well as wraps to below the knees Soft, nontender Right upper extremity PICC line noted Nonfocal, alert and oriented  A 1. AoCKD3 2/2 Cardiorenal Syndrome 2. Acute Systolic HF exacerbation 3. NSVT 4. Thrombocytopenia, chronic stable 5. Hyperthyroid on methimazole and prednisone 6. Paroxysmal atrial  fibrillation  P 1. At this point there is no further medication titration available, it appears that we are not significantly progressing. I will discuss with heart failure and potentially will transition to CRRT this weekend to improve his volume status 2. For now we'll continue on current doses of diuretics   Pearson Grippe MD 07/05/2016, 9:36 AM   Recent Labs Lab 07/03/16 0359 07/04/16 0257 07/05/16 0445  NA 132* 135 132*  K 3.7 3.9 3.4*  CL 92* 91* 88*  CO2 27 31 34*  GLUCOSE 129* 129* 104*  BUN 120* 121* 121*  CREATININE 3.44* 3.27* 3.24*  CALCIUM 8.7* 8.9 8.6*  PHOS 3.9 4.3 3.8    Recent Labs Lab 06/29/16 1037  07/03/16 0359 07/04/16 0257 07/05/16 0445  WBC 5.3  < > 5.3 4.9 5.0  NEUTROABS 4.2  --   --   --   --   HGB 11.8*  < > 9.4* 9.7* 9.6*  HCT 36.8*  < > 29.3* 29.7* 30.7*  MCV 74.0*  < > 73.3* 73.9* 74.5*  PLT 63*  < > 54* 55* 56*  < > = values in this interval not displayed.

## 2016-07-05 NOTE — Progress Notes (Signed)
ANTICOAGULATION CONSULT NOTE - Follow Up Consult  Pharmacy Consult for Heparin (Apixaban on hold) Indication: atrial fibrillation  Allergies  Allergen Reactions  . Other Nausea And Vomiting    Patient stated drug is "kerein"? Formulation, dose, indication?   Patient Measurements: Height: 6' (182.9 cm) (per pt previously documented 6' 2: was incorrect) Weight: 216 lb 4.3 oz (98.1 kg) IBW/kg (Calculated) : 77.6  Vital Signs: Temp: 97.6 F (36.4 C) (05/05 0725) Temp Source: Oral (05/05 0725) BP: 110/77 (05/05 0725) Pulse Rate: 93 (05/05 0725)  Labs:  Recent Labs  07/03/16 0359 07/04/16 0257 07/04/16 1403 07/05/16 0043 07/05/16 0044 07/05/16 0445 07/05/16 0900  HGB 9.4* 9.7*  --   --   --  9.6*  --   HCT 29.3* 29.7*  --   --   --  30.7*  --   PLT 54* 55*  --   --   --  56*  --   APTT  --   --   --  72*  --   --  61*  HEPARINUNFRC  --   --  0.38  --  0.43  --  0.24*  CREATININE 3.44* 3.27*  --   --   --  3.24*  --     Estimated Creatinine Clearance: 27.6 mL/min (A) (by C-G formula based on SCr of 3.24 mg/dL (H)).  Assessment: On heparin while apixaban on hold in setting of worsening renal function.   Heparin infusion resumed due to improved platelet counts. H/H remain stable. HL and aptt correlating. Currently sub-therapeutic.  Goal of Therapy:  Heparin level 0.3-0.7 units/ml Monitor platelets by anticoagulation protocol: Yes   Plan:  -Increase heparin to 1450 units/hr -Will f/u 8hr heparin level to confirm therapeutic. Stop aptt monitoring   Albertina Parr, PharmD., BCPS Clinical Pharmacist Phone [phone]

## 2016-07-05 NOTE — Progress Notes (Signed)
Pt change his mind and wants to wear the CPAP for the night. Pt is on the NIV machine at this time tolerating it well.  RN aware

## 2016-07-05 NOTE — Progress Notes (Signed)
Walked into patients room to introduce myself and patient was asleep. Upon trying to remove IV tubing from Picc line lumen, patient was startled and started swearing verbally at me. Asked patient to remain calm and he refused to answer any more questions and would not allow me to make an assessment. Will try later tonight.

## 2016-07-05 NOTE — Progress Notes (Signed)
Patient ID: AEDIN JEANSONNE, male   DOB: April 13, 1950, 66 y.o.   MRN: [id number]     Advanced Heart Failure Rounding Note   Subjective:    Mr Pollio is a 66 year old with a history of DMII, HTN, HF, COPD, former ETOH abuse (stopped drinking 2014), noncompliance, and systolic heart failure admitted with volume overload and AKI in the setting on medication noncompliance.   . Remains on high dose IV lasix, milrinone and metolazone. Good u/o (~3L) but weight unchanged at 216. Denies dyspnea or PND.   Appears to be back in NSR on tele with PVCs. Wants to avoid dialysis if he can  Objective:   Weight Range: 98.1 kg (216 lb 4.3 oz) Body mass index is 29.33 kg/m.   Vital Signs:   Temp:  [97.5 F (36.4 C)-97.8 F (36.6 C)] 97.5 F (36.4 C) (05/05 1235) Pulse Rate:  [77-152] 95 (05/05 1235) Resp:  [14-24] 24 (05/05 1235) BP: (99-124)/(73-85) 115/76 (05/05 1235) SpO2:  [91 %-100 %] 91 % (05/05 1235) Weight:  [98.1 kg (216 lb 4.3 oz)] 98.1 kg (216 lb 4.3 oz) (05/05 0500) Last BM Date: 07/05/16  Weight change: Filed Weights   07/03/16 0356 07/04/16 0500 07/05/16 0500  Weight: 103 kg (227 lb 1.2 oz) 98.2 kg (216 lb 7.9 oz) 98.1 kg (216 lb 4.3 oz)    Intake/Output:   Intake/Output Summary (Last 24 hours) at 07/05/16 1437 Last data filed at 07/05/16 1312  Gross per 24 hour  Intake          1280.92 ml  Output             2085 ml  Net          -804.08 ml     Physical Exam: CVP 10-12 General:  Elderly and fatigued appearing. NAD. Sitting in recliner.  HEENT: Normal. Except for poor dentition. Anicteric Neck: Supple. JVP to jaw Cor: PMI laterally displaced. RRR. No rubs or murmurs. + S3.  Lungs: clear   Abdomen: soft. Mildly distended. NT. Good BS Extremities: no cyanosis, clubbing, rash, R and LLE compression wraps. 2+ edema into thighs. Bright color fingernail polish - different color on each one Neuro: Alert & oriented x 3. Cranial nerves grossly intact. Moves all 4 extremities w/o  difficulty. Affect flat but appropriate.  Telemetry: NSR 90s. Personally reviewed   Labs: CBC  Recent Labs  07/04/16 0257 07/05/16 0445  WBC 4.9 5.0  HGB 9.7* 9.6*  HCT 29.7* 30.7*  MCV 73.9* 74.5*  PLT 55* 56*   Basic Metabolic Panel  Recent Labs  07/04/16 0257 07/05/16 0445  NA 135 132*  K 3.9 3.4*  CL 91* 88*  CO2 31 34*  GLUCOSE 129* 104*  BUN 121* 121*  CREATININE 3.27* 3.24*  CALCIUM 8.9 8.6*  MG 1.9 1.8  PHOS 4.3 3.8   Liver Function Tests  Recent Labs  07/04/16 0257 07/05/16 0445  ALBUMIN 2.0* 2.2*   No results for input(s): LIPASE, AMYLASE in the last 72 hours. Cardiac Enzymes No results for input(s): CKTOTAL, CKMB, CKMBINDEX, TROPONINI in the last 72 hours.  BNP: BNP (last 3 results)  Recent Labs  06/27/16 0349  BNP >4,500.0*    ProBNP (last 3 results) No results for input(s): PROBNP in the last 8760 hours.   D-Dimer No results for input(s): DDIMER in the last 72 hours. Hemoglobin A1C No results for input(s): HGBA1C in the last 72 hours. Fasting Lipid Panel No results for input(s): CHOL, HDL, LDLCALC,  TRIG, CHOLHDL, LDLDIRECT in the last 72 hours. Thyroid Function Tests No results for input(s): TSH, T4TOTAL, T3FREE, THYROIDAB in the last 72 hours.  Invalid input(s): FREET3  Other results:     Imaging/Studies:  No results found.    Medications:     Scheduled Medications: . allopurinol  100 mg Oral Daily  . amiodarone  200 mg Oral BID  . guaiFENesin  600 mg Oral BID  . insulin aspart  0-9 Units Subcutaneous TID WC  . mouth rinse  15 mL Mouth Rinse BID  . methimazole  10 mg Oral TID  . metolazone  5 mg Oral BID  . pantoprazole  40 mg Oral Q0600  . polyethylene glycol  17 g Oral Daily  . potassium chloride  10 mEq Oral TID  . sodium chloride flush  10-40 mL Intracatheter Q12H  . sodium chloride flush  3 mL Intravenous Q12H    Infusions: . sodium chloride    . furosemide Stopped (07/05/16 1025)  . heparin  1,450 Units/hr (07/05/16 1312)  . milrinone 0.375 mcg/kg/min (07/05/16 0739)    PRN Medications: sodium chloride, acetaminophen, albuterol, alum & mag hydroxide-simeth, bisacodyl, guaiFENesin-dextromethorphan, ondansetron (ZOFRAN) IV, sodium chloride flush, sodium chloride flush, traMADol   Assessment/Plan   1. Acute on chronic systolic CHF: Nonischemic cardiomyopathy.  Echo 4/18 with EF 20%, moderately dilated RV, moderate-severe MR and TR. Low output HF with creatinine up considerably.  He remains volume overloaded, CVP 17. Weight has not changed in the hospital.  Some UOP but not vigorous in setting of aggressive diuretic regimen.   - remains tenuous but seems to be improving with inotrope support and diuresis - Coox OK this am 74% on milrinone 0.375 mcg/kg/min.   - Currently on lasix 160 mg q 6 hrs per renal and metolazone bid - CVP 10-12 - Volume status seems to be improving on current regimen. Renal function stable, if not a bit improved. No clear indications for HD. Would continue current regimen. I discussed with Dr. Joelyn Oms and we have agreed to continue to watch and wait  - No beta blocker with low output. No Ace/Arb/Dig/spiro with AKI.  - Suspect he would be a poor candidate for advanced therapies. He lives alone and has been noncompliant, also with significant AKI.  2. Hyperthyroidism: New diagnosis. ? Related to amio.   - Continue prednisone and methimazole as treatment of amiodarone-induced hyperthyroidism. No change.  3. NSVT: NSVT and PVCs on milrinone.  Per EP, no definite benefit at this point to stopping amiodarone and may make hyperthyroidism worse by increasing conversion of T4=>T3.  Therefore, have elected not to use mexiletine and to keep him on amiodarone.  - Continue amio 200 mg BID.  - Hyperthyroidism treated with prednisone and methimazole.  4. Atrial fibrillation:  Paroxysmal.  - He went into atrial fibrillation earlier this week. Back in NSR over last 24 hrs.  Platelets remain low at 55k.  - Continue to follow as he seems to be in/out at times. Continue po amio as above.  - Plts have been stable in the mid 50K range. Back on heparin gtt. PharmD managing. Apixaban on hold 5. Thrombocytopenia:  Doubt HIT as this preceded admission, looking back he has had thrombocytopenia since 3/17. Platelets 56K today. ? Cirrhosis but CT in 2014 comments on normal liver. Platelets normal in 2016 but 98k in 3/17 6. AKI on CKD stage 3: Minimal change in renal function. - Nephrology following for possible CVVHD. See discussion as above.  7.  Hypokalemia - Will supp gently. Discussed with PharmD  Length of Stay: 8  Glori Bickers, MD  07/05/2016, 2:37 PM  Advanced Heart Failure Team Pager [phone] (M-F; 7a - 4p)  Please contact [facility] for night-coverage after hours (4p -7a ) and weekends on [email or web address]

## 2016-07-06 DIAGNOSIS — N183 Chronic kidney disease, stage 3 (moderate): Secondary | ICD-10-CM

## 2016-07-06 DIAGNOSIS — E876 Hypokalemia: Secondary | ICD-10-CM

## 2016-07-06 DIAGNOSIS — Z8679 Personal history of other diseases of the circulatory system: Secondary | ICD-10-CM

## 2016-07-06 DIAGNOSIS — N170 Acute kidney failure with tubular necrosis: Secondary | ICD-10-CM

## 2016-07-06 LAB — BASIC METABOLIC PANEL
Anion gap: 11 (ref 5–15)
BUN: 113 mg/dL — AB (ref 6–20)
CHLORIDE: 86 mmol/L — AB (ref 101–111)
CO2: 37 mmol/L — AB (ref 22–32)
CREATININE: 2.9 mg/dL — AB (ref 0.61–1.24)
Calcium: 8.4 mg/dL — ABNORMAL LOW (ref 8.9–10.3)
GFR calc Af Amer: 25 mL/min — ABNORMAL LOW (ref >60)
GFR calc non Af Amer: 21 mL/min — ABNORMAL LOW (ref >60)
GLUCOSE: 163 mg/dL — AB (ref 65–99)
POTASSIUM: 3.4 mmol/L — AB (ref 3.5–5.1)
Sodium: 134 mmol/L — ABNORMAL LOW (ref 135–145)

## 2016-07-06 LAB — RENAL FUNCTION PANEL
ALBUMIN: 2 g/dL — AB (ref 3.5–5.0)
Anion gap: 11 (ref 5–15)
BUN: 118 mg/dL — AB (ref 6–20)
CALCIUM: 8.4 mg/dL — AB (ref 8.9–10.3)
CO2: 38 mmol/L — ABNORMAL HIGH (ref 22–32)
CREATININE: 2.97 mg/dL — AB (ref 0.61–1.24)
Chloride: 87 mmol/L — ABNORMAL LOW (ref 101–111)
GFR calc Af Amer: 24 mL/min — ABNORMAL LOW (ref >60)
GFR, EST NON AFRICAN AMERICAN: 21 mL/min — AB (ref >60)
GLUCOSE: 84 mg/dL (ref 65–99)
Phosphorus: 3 mg/dL (ref 2.5–4.6)
Potassium: 3.1 mmol/L — ABNORMAL LOW (ref 3.5–5.1)
SODIUM: 136 mmol/L (ref 135–145)

## 2016-07-06 LAB — GLUCOSE, CAPILLARY
GLUCOSE-CAPILLARY: 162 mg/dL — AB (ref 65–99)
GLUCOSE-CAPILLARY: 115 mg/dL — AB (ref 65–99)
Glucose-Capillary: 97 mg/dL (ref 65–99)
Glucose-Capillary: 98 mg/dL (ref 65–99)
Glucose-Capillary: 104 mg/dL — ABNORMAL HIGH (ref 65–99)
Glucose-Capillary: 155 mg/dL — ABNORMAL HIGH (ref 65–99)

## 2016-07-06 LAB — CBC
HCT: 30.5 % — ABNORMAL LOW (ref 39.0–52.0)
Hemoglobin: 9.8 g/dL — ABNORMAL LOW (ref 13.0–17.0)
MCH: 24.2 pg — ABNORMAL LOW (ref 26.0–34.0)
MCHC: 32.1 g/dL (ref 30.0–36.0)
MCV: 75.3 fL — ABNORMAL LOW (ref 78.0–100.0)
PLATELETS: 43 10*3/uL — AB (ref 150–400)
RBC: 4.05 MIL/uL — ABNORMAL LOW (ref 4.22–5.81)
RDW: 19.1 % — AB (ref 11.5–15.5)
WBC: 4.1 10*3/uL (ref 4.0–10.5)

## 2016-07-06 LAB — COOXEMETRY PANEL
CARBOXYHEMOGLOBIN: 1.8 % — AB (ref 0.5–1.5)
Methemoglobin: 1.3 % (ref 0.0–1.5)
O2 Saturation: 67.2 %
Total hemoglobin: 10.1 g/dL — ABNORMAL LOW (ref 12.0–16.0)

## 2016-07-06 LAB — MAGNESIUM: Magnesium: 1.8 mg/dL (ref 1.7–2.4)

## 2016-07-06 LAB — HEPARIN LEVEL (UNFRACTIONATED): Heparin Unfractionated: 0.36 IU/mL (ref 0.30–0.70)

## 2016-07-06 LAB — APTT: APTT: 123 s — AB (ref 24–36)

## 2016-07-06 MED ORDER — FUROSEMIDE 10 MG/ML IJ SOLN
160.0000 mg | Freq: Three times a day (TID) | INTRAVENOUS | Status: DC
  Administered 2016-07-06 – 2016-07-07 (×3): 160 mg via INTRAVENOUS
  Filled 2016-07-06: qty 10
  Filled 2016-07-06 (×3): qty 16

## 2016-07-06 MED ORDER — POTASSIUM CHLORIDE CRYS ER 10 MEQ PO TBCR
10.0000 meq | EXTENDED_RELEASE_TABLET | Freq: Once | ORAL | Status: AC
  Administered 2016-07-06: 10 meq via ORAL

## 2016-07-06 MED ORDER — POTASSIUM CHLORIDE CRYS ER 20 MEQ PO TBCR
40.0000 meq | EXTENDED_RELEASE_TABLET | Freq: Two times a day (BID) | ORAL | Status: DC
  Administered 2016-07-06 – 2016-07-07 (×4): 40 meq via ORAL
  Filled 2016-07-06 (×4): qty 2

## 2016-07-06 MED ORDER — METOLAZONE 5 MG PO TABS
5.0000 mg | ORAL_TABLET | Freq: Every day | ORAL | Status: DC
  Administered 2016-07-07: 5 mg via ORAL
  Filled 2016-07-06: qty 1

## 2016-07-06 MED ORDER — POTASSIUM CHLORIDE CRYS ER 20 MEQ PO TBCR: 20.0000 meq | EXTENDED_RELEASE_TABLET | Freq: Two times a day (BID) | ORAL | Status: DC

## 2016-07-06 MED ORDER — ACETAZOLAMIDE 250 MG PO TABS
250.0000 mg | ORAL_TABLET | Freq: Two times a day (BID) | ORAL | Status: DC
Start: 2016-07-06 — End: 2016-07-07
  Administered 2016-07-06 – 2016-07-07 (×3): 250 mg via ORAL
  Filled 2016-07-06 (×4): qty 1

## 2016-07-06 NOTE — Progress Notes (Signed)
Pt has unit in room but was unarrousable for questioning about cpap.  RT spoke with RN and this is pts baseline.  RN said she would call if need for cpap arises.  RT will monitor.

## 2016-07-06 NOTE — Progress Notes (Signed)
ANTICOAGULATION CONSULT NOTE - Follow Up Consult  Pharmacy Consult for Heparin (Apixaban on hold) Indication: atrial fibrillation  Allergies  Allergen Reactions  . Other Nausea And Vomiting    Patient stated drug is "kerein"? Formulation, dose, indication?   Patient Measurements: Height: 6' (182.9 cm) (per pt previously documented 6' 2: was incorrect) Weight: 216 lb 4.3 oz (98.1 kg) IBW/kg (Calculated) : 77.6  Vital Signs: Temp: 98.1 F (36.7 C) (05/05 2017) Temp Source: Oral (05/05 2017) BP: 111/74 (05/05 2017) Pulse Rate: 87 (05/05 2017)  Labs:  Recent Labs  07/03/16 0359 07/04/16 0257  07/05/16 0043 07/05/16 0044 07/05/16 0445 07/05/16 0900 07/05/16 2100  HGB 9.4* 9.7*  --   --   --  9.6*  --   --   HCT 29.3* 29.7*  --   --   --  30.7*  --   --   PLT 54* 55*  --   --   --  56*  --   --   APTT  --   --   --  72*  --   --  61*  --   HEPARINUNFRC  --   --   < >  --  0.43  --  0.24* 0.42  CREATININE 3.44* 3.27*  --   --   --  3.24*  --   --   < > = values in this interval not displayed.  Estimated Creatinine Clearance: 27.6 mL/min (A) (by C-G formula based on SCr of 3.24 mg/dL (H)).  Assessment: On heparin while apixaban on hold in setting of worsening renal function.   Heparin infusion resumed due to improved platelet counts. Heparin level therapeutic (0.42) on gtt at 1450 units/hr. No bleeding noted.  Goal of Therapy:  Heparin level 0.3-0.7 units/ml Monitor platelets by anticoagulation protocol: Yes   Plan:  Continue heparin at 1450 units/hr Will f/u daily heparin level and CBC  Sherlon Handing, PharmD, BCPS Clinical pharmacist, pager [phone] 07/06/2016 12:31 AM

## 2016-07-06 NOTE — Progress Notes (Signed)
Patient ID: VIOLA PLACERES, male   DOB: Sep 26, 1950, 66 y.o.   MRN: [id number]     Advanced Heart Failure Rounding Note   Subjective:    Mr Markson is a 66 year old with a history of DMII, HTN, HF, COPD, former ETOH abuse (stopped drinking 2014), noncompliance, and systolic heart failure admitted with volume overload and AKI in the setting on medication noncompliance.   . Remains on high dose IV lasix, milrinone and metolazone. Out another 2.3L overnight. Breathing much better. No orthopnea or PND. Creatinine down to 2.9  Remains in NSR. CVP 10  Objective:   Weight Range: 93.8 kg (206 lb 11.2 oz) Body mass index is 28.03 kg/m.   Vital Signs:   Temp:  [97.5 F (36.4 C)-98.7 F (37.1 C)] 97.7 F (36.5 C) (05/06 1137) Pulse Rate:  [83-92] 86 (05/06 1200) Resp:  [15-29] 21 (05/06 1200) BP: (103-118)/(61-80) 107/72 (05/06 1200) SpO2:  [91 %-100 %] 100 % (05/06 1200) Weight:  [93.8 kg (206 lb 11.2 oz)] 93.8 kg (206 lb 11.2 oz) (05/06 0515) Last BM Date: 07/06/16  Weight change: Filed Weights   07/04/16 0500 07/05/16 0500 07/06/16 0515  Weight: 98.2 kg (216 lb 7.9 oz) 98.1 kg (216 lb 4.3 oz) 93.8 kg (206 lb 11.2 oz)    Intake/Output:   Intake/Output Summary (Last 24 hours) at 07/06/16 1504 Last data filed at 07/06/16 1200  Gross per 24 hour  Intake          1206.01 ml  Output             2750 ml  Net         -1543.99 ml     Physical Exam: CVP 10 with prominent v waves General: Lying in bed NAD HEENT: Normal. Poor dentition. Anicteric Neck: Supple. JVP 10 Cor: PMI laterally displaced. RRR. 2/6 SEM LSB +s3 Lungs: clear Abdomen: soft. NT/ND good BS Extremities: no cyanosis, clubbing, rash, R and LLE compression wraps. 1-2+ edema  Bright color fingernail glue-ons - different color on each one Neuro: Alert & oriented x 3. Cranial nerves grossly intact. Moves all 4 extremities w/o difficulty. Odd affect  Telemetry: NSR 80s. Personally reviewed    Labs: CBC  Recent  Labs  07/05/16 0445 07/06/16 0500  WBC 5.0 4.1  HGB 9.6* 9.8*  HCT 30.7* 30.5*  MCV 74.5* 75.3*  PLT 56* 43*   Basic Metabolic Panel  Recent Labs  07/05/16 0445 07/06/16 0500  NA 132* 136  K 3.4* 3.1*  CL 88* 87*  CO2 34* 38*  GLUCOSE 104* 84  BUN 121* 118*  CREATININE 3.24* 2.97*  CALCIUM 8.6* 8.4*  MG 1.8 1.8  PHOS 3.8 3.0   Liver Function Tests  Recent Labs  07/05/16 0445 07/06/16 0500  ALBUMIN 2.2* 2.0*   No results for input(s): LIPASE, AMYLASE in the last 72 hours. Cardiac Enzymes No results for input(s): CKTOTAL, CKMB, CKMBINDEX, TROPONINI in the last 72 hours.  BNP: BNP (last 3 results)  Recent Labs  06/27/16 0349  BNP >4,500.0*    ProBNP (last 3 results) No results for input(s): PROBNP in the last 8760 hours.   D-Dimer No results for input(s): DDIMER in the last 72 hours. Hemoglobin A1C No results for input(s): HGBA1C in the last 72 hours. Fasting Lipid Panel No results for input(s): CHOL, HDL, LDLCALC, TRIG, CHOLHDL, LDLDIRECT in the last 72 hours. Thyroid Function Tests No results for input(s): TSH, T4TOTAL, T3FREE, THYROIDAB in the last 72 hours.  Invalid input(s): FREET3  Other results:     Imaging/Studies:  No results found.    Medications:     Scheduled Medications: . acetaZOLAMIDE  250 mg Oral BID  . allopurinol  100 mg Oral Daily  . amiodarone  200 mg Oral BID  . guaiFENesin  600 mg Oral BID  . insulin aspart  0-9 Units Subcutaneous TID WC  . mouth rinse  15 mL Mouth Rinse BID  . methimazole  10 mg Oral TID  . [START ON 07/07/2016] metolazone  5 mg Oral Daily  . pantoprazole  40 mg Oral Q0600  . polyethylene glycol  17 g Oral Daily  . potassium chloride  40 mEq Oral BID  . sodium chloride flush  10-40 mL Intracatheter Q12H  . sodium chloride flush  3 mL Intravenous Q12H    Infusions: . sodium chloride    . furosemide    . heparin 1,450 Units/hr (07/06/16 0615)  . milrinone 0.375 mcg/kg/min (07/06/16 0921)     PRN Medications: sodium chloride, acetaminophen, albuterol, alum & mag hydroxide-simeth, bisacodyl, guaiFENesin-dextromethorphan, ondansetron (ZOFRAN) IV, sodium chloride flush, sodium chloride flush, traMADol   Assessment/Plan   1. Acute on chronic systolic CHF: Nonischemic cardiomyopathy.  Echo 4/18 with EF 20%, moderately dilated RV, moderate-severe MR and TR. Low output HF with creatinine up considerably.  He remains volume overloaded, CVP 17. Weight has not changed in the hospital.  Some UOP but not vigorous in setting of aggressive diuretic regimen.   - remains tenuous but improving with inotrope support and diuresis. Weight down 10 pounds over the weekend - Coox 67% on milrinone 0.375 mcg/kg/min.   - Renal decreased lasix and metolazone due to metabolic alkalosis. Will add low-dose diamox - Renal function improving slowly. Discussed with Renal team. No indication for HD at this point,  - No beta blocker with low output. No Ace/Arb/Dig/spiro with AKI.  - Once fully diuresed will wean inotropes slowly.  - Suspect he would be a poor candidate for advanced therapies. He lives alone and has been noncompliant, also with significant AKI.  2. Hyperthyroidism: New diagnosis. ? Related to amio.   - Continue prednisone and methimazole as treatment of amiodarone-induced hyperthyroidism. No change.  3. NSVT: NSVT and PVCs on milrinone.  Per EP, no definite benefit at this point to stopping amiodarone and may make hyperthyroidism worse by increasing conversion of T4=>T3.  Therefore, have elected not to use mexiletine and to keep him on amiodarone.  - Continue amio 200 mg BID.  - Hyperthyroidism treated with prednisone and methimazole.  4. Atrial fibrillation:  Paroxysmal.  - He went into atrial fibrillation earlier this week. Remains in NST today. Platelets remain low at 55k-> 43K.  - Continue po amio as above.  - Tolerating heparin gtt without bleeding.  PharmD managing. Apixaban on hold 5.  Thrombocytopenia:  Doubt HIT as this preceded admission, looking back he has had thrombocytopenia since 3/17. Platelets 43K today. ? Cirrhosis but CT in 2014 comments on normal liver. Platelets normal in 2016 but 98k in 3/17 - Can consider RUQ u/s to further evaluate liver  6. AKI on CKD stage 3:  - Improving with inotrope support 7. Hypokalemia - Continue to supp. Discussed with PharmD on rounds   Length of Stay: 9  Glori Bickers, MD  07/06/2016, 3:04 PM  Advanced Heart Failure Team Pager [phone] (M-F; 7a - 4p)  Please contact [facility] for night-coverage after hours (4p -7a ) and weekends on [email or web address]

## 2016-07-06 NOTE — Progress Notes (Signed)
Admit: 06/27/2016 LOS: 9  10M AoCKD4 from cardiorenal syndrome  Subjective:  Another ~1L net negative Worsening Alkalosis, diuretic related; worsened hypokalemia Weight down 10lb?  SCr cont to improve  05/05 0701 - 05/06 0700 In: 1416.1 [P.O.:480; I.V.:622.1; IV Piggyback:314] Out: 2275 [Urine:2275]  Filed Weights   07/04/16 0500 07/05/16 0500 07/06/16 0515  Weight: 98.2 kg (216 lb 7.9 oz) 98.1 kg (216 lb 4.3 oz) 93.8 kg (206 lb 11.2 oz)    Scheduled Meds: . allopurinol  100 mg Oral Daily  . amiodarone  200 mg Oral BID  . guaiFENesin  600 mg Oral BID  . insulin aspart  0-9 Units Subcutaneous TID WC  . mouth rinse  15 mL Mouth Rinse BID  . methimazole  10 mg Oral TID  . metolazone  5 mg Oral BID  . pantoprazole  40 mg Oral Q0600  . polyethylene glycol  17 g Oral Daily  . potassium chloride  20 mEq Oral BID  . sodium chloride flush  10-40 mL Intracatheter Q12H  . sodium chloride flush  3 mL Intravenous Q12H   Continuous Infusions: . sodium chloride    . furosemide Stopped (07/06/16 0701)  . heparin 1,450 Units/hr (07/06/16 0615)  . milrinone 0.375 mcg/kg/min (07/06/16 0615)   PRN Meds:.sodium chloride, acetaminophen, albuterol, alum & mag hydroxide-simeth, bisacodyl, guaiFENesin-dextromethorphan, ondansetron (ZOFRAN) IV, sodium chloride flush, sodium chloride flush, traMADol  Current Labs: reviewed    Physical Exam:  Blood pressure 103/70, pulse 83, temperature 97.5 F (36.4 C), temperature source Oral, resp. rate 19, height 6' (1.829 m), weight 93.8 kg (206 lb 11.2 oz), SpO2 92 %. NAD, speaks in full sentences EOMI Regular rate and rhythm Continues to have edema throughout legs as well as wraps to below the knees Soft, nontender Right upper extremity PICC line noted Nonfocal, alert and oriented  A 1. AoCKD3 2/2 Cardiorenal Syndrome 2. Progressive metabolic alkalosis 2/2 diuretics 3. Hyopkalemia 2/2 diuretics 4. Acute Systolic HF  exacerbation 5. NSVT 6. Thrombocytopenia, chronic stable 7. Hyperthyroid on methimazole and prednisone 8. Paroxysmal atrial fibrillation  P 1. Increase KCl to 40MEq BID 2. Reduce metolazone to daily, lasix to TID 3. No need for CRRT at this point, both renal and cardiac paraemeters are improving 4. Repeat BMP this PM  Pearson Grippe MD 07/06/2016, 8:06 AM   Recent Labs Lab 07/04/16 0257 07/05/16 0445 07/06/16 0500  NA 135 132* 136  K 3.9 3.4* 3.1*  CL 91* 88* 87*  CO2 31 34* 38*  GLUCOSE 129* 104* 84  BUN 121* 121* 118*  CREATININE 3.27* 3.24* 2.97*  CALCIUM 8.9 8.6* 8.4*  PHOS 4.3 3.8 3.0    Recent Labs Lab 06/29/16 1037  07/04/16 0257 07/05/16 0445 07/06/16 0500  WBC 5.3  < > 4.9 5.0 4.1  NEUTROABS 4.2  --   --   --   --   HGB 11.8*  < > 9.7* 9.6* 9.8*  HCT 36.8*  < > 29.7* 30.7* 30.5*  MCV 74.0*  < > 73.9* 74.5* 75.3*  PLT 63*  < > 55* 56* 43*  < > = values in this interval not displayed.

## 2016-07-06 NOTE — Progress Notes (Signed)
ANTICOAGULATION CONSULT NOTE - Follow Up Consult  Pharmacy Consult for Heparin (Apixaban on hold) Indication: atrial fibrillation  Allergies  Allergen Reactions  . Other Nausea And Vomiting    Patient stated drug is "kerein"? Formulation, dose, indication?   Patient Measurements: Height: 6' (182.9 cm) (per pt previously documented 6' 2: was incorrect) Weight: 206 lb 11.2 oz (93.8 kg) IBW/kg (Calculated) : 77.6  Vital Signs: Temp: 98.7 F (37.1 C) (05/06 0800) Temp Source: Oral (05/06 0800) BP: 104/61 (05/06 0800) Pulse Rate: 88 (05/06 0800)  Labs:  Recent Labs  07/04/16 0257  07/05/16 0043  07/05/16 0445 07/05/16 0900 07/05/16 2100 07/06/16 0500  HGB 9.7*  --   --   --  9.6*  --   --  9.8*  HCT 29.7*  --   --   --  30.7*  --   --  30.5*  PLT 55*  --   --   --  56*  --   --  43*  APTT  --   --  72*  --   --  61*  --  123*  HEPARINUNFRC  --   < >  --   < >  --  0.24* 0.42 0.36  CREATININE 3.27*  --   --   --  3.24*  --   --  2.97*  < > = values in this interval not displayed.  Estimated Creatinine Clearance: 29.5 mL/min (A) (by C-G formula based on SCr of 2.97 mg/dL (H)).  Assessment: On heparin while apixaban on hold in setting of worsening renal function.   Heparin infusion resumed due to improved platelet counts. Heparin level therapeutic (0.36) on gtt at 1450 units/hr. No bleeding noted.  Goal of Therapy:  Heparin level 0.3-0.7 units/ml Monitor platelets by anticoagulation protocol: Yes   Plan:  Continue heparin at 1450 units/hr Will f/u daily heparin level and CBC  Albertina Parr, PharmD., BCPS Clinical Pharmacist Pager [phone]

## 2016-07-06 NOTE — Progress Notes (Signed)
Progress Note    Caleb Bauer  JOA:[id number] DOB: 10-Nov-1950  DOA: 06/27/2016 PCP: [name], MD    Brief Narrative:   Chief complaint: Follow-up chest pain, shortness of breath and lower extremity edema  Medical records reviewed and are as summarized below:  Caleb Bauer is an 66 y.o. 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 In the ED, he was given 80 mg of Lasix and 324 mg of aspirin and referred to [facility] for further care. BNP greater than 4500 on admission. 2-D echo showed an EF of 20% with moderate to severe MR and a moderately dilated RV. Not felt to be a candidate for ICD or pacemaker due to history of noncompliance and multiple medical comorbidities. PICC line placed 06/30/16 with cardiology following co-oximetry readings.  Assessment/Plan:   Principal Problem:   Acute respiratory failure secondary to Acute on chronic combined systolic (congestive) and diastolic (congestive) heart failure (HCC)/Bilateral leg edema/Anasarca Medications being managed by cardiology with current regimen consisting of amiodarone, Zaroxolyn, Primacor and high dose Lasix. Weight without significant change overnight, I/O balance -858 mL. CVP 14. Lasix and metolazone dosed per cardiologist, but it appears that there is no further room for up titration, so may need CRRT, but no indication yet per nephrologist. No hypoxia in the last 72 hours, but remains on supplemental oxygen, and co-oximetry oxygen saturation dropped this morning to 67.2%. He will likely require home oxygen. Continue  cardiac rehab.   Active Problems:   Hypokalemia Secondary to aggressive diuresis. Potassium lower today, increase supplementation dose to 20 mEq twice a day.    Mild hyponatremia Likely reflective of CHF physiology. Sodium normal today.    Hypertensive heart disease Blood pressure remains controlled on regimen as noted above: 100-110s/70s.    Diabetes mellitus without complication (HCC) Hemoglobin A1c 6.2%. Currently being managed with insulin sensitive SSI 3 times a day. Blood sugars controlled with a range of 96-162.    COPD (chronic obstructive pulmonary disease) (HCC) Continue Mucinex and bronchodilators. Remains stable.    Atrial flutter by electrocardiogram ([place]) Initially managed with heparin drip, subsequently discontinued secondary to thrombocytopenia. Resumed 07/04/16. Platelet count has dropped to 43, continue to monitor closely.    GERD (gastroesophageal reflux disease) Continue Protonix since the patient is on 40 mg of prednisone daily.    Iron deficiency anemia Hemoglobin stable, 9.8 mg/dL today.    Chronic gouty arthritis Continue allopurinol.    Sleep apnea Continue CPAP daily at bedtime.    Chest pain Troponin was mildly elevated, trend was flat. Currently denies chest pain.    Diabetic ulcers Chronic full thickness wound on the left posterior leg with superimposed cellulitis. Evaluated by wound care nurse with recommendations for wound care noted.    AKI (acute kidney injury) (HCC)/stage III CKD/cardiorenal syndrome Felt to have cardiorenal syndrome with poor cardiac output. Evaluated by nephrology 06/30/16. Continue diuretics and inotropes. ACE inhibitor on hold. Eliquis discontinued secondary to impaired renal function. Creatinine slowly trending down, 2.97 today.    Ventricular tachycardia, nonsustained ([place]) Continue to monitor on telemetry. On milrinone.    Amiodarone-induced thyroiditis/Other thyrotoxicosis without thyrotoxic crisis  or storm Okay to  continue amiodarone per cardiology, treating hyperthyroidism with prednisone plus methimazole.    Chronic thrombocytopenia Unclear etiology, but thought to be reactive. Platelet count remains 43 today.    Obesity/physical deconditioning Body mass index is 28.03 kg/m. PT/OT, SNF placement when stable.   Family Communication/Anticipated D/C date and plan/Code Status   DVT prophylaxis: Heparin resumed 07/04/16. Code Status: Full Code.  Family Communication: No family present at the bedside. Disposition Plan: SNF recommended by PT.   Medical Consultants:    Cardiology/advanced heart failure team  Electrophysiology  Nephrology   Procedures:   PICC line placement   2-D echocardiogram  Left ventricle: The cavity size was moderately dilated. Wall   thickness was normal. The estimated ejection fraction was 20%.   Diffuse hypokinesis. Doppler parameters are consistent with both   elevated ventricular end-diastolic filling pressure and elevated   left atrial filling pressure. - Mitral valve: There was moderate to severe regurgitation. Valve   area by pressure half-time: 0.85 cm^2. - Left atrium: The atrium was moderately dilated. - Right ventricle: The cavity size was moderately dilated. - Right atrium: The atrium was moderately dilated. - Atrial septum: No defect or patent foramen ovale was identified. - Tricuspid valve: There was moderate-severe regurgitation. - Pulmonary arteries: PA peak pressure: 37 mm Hg (S).  Anti-Infectives:    None  Subjective:   Says his breathing is "fine".  No chest pain.  Appetite good. No nausea or vomiting.  Moved his bowels 3 times this morning.     Objective:    Vitals:   07/06/16 0135 07/06/16 0200 07/06/16 0400 07/06/16 0515  BP: 107/80 118/78 114/79 103/70  Pulse: 89 90 84 83  Resp: (!) 25 (!) 29 17 19   Temp: 98 F (36.7 C)   97.5 F (36.4 C)  TempSrc: Oral   Oral  SpO2: 95% 94% 95% 92%  Weight:    93.8 kg (206 lb 11.2 oz)    Height:        Intake/Output Summary (Last 24 hours) at 07/06/16 0722 Last data filed at 07/06/16 0615  Gross per 24 hour  Intake          1416.13 ml  Output             2275 ml  Net          -858.87 ml   Filed Weights   07/04/16 0500 07/05/16 0500 07/06/16 0515  Weight: 98.2 kg (216 lb 7.9 oz) 98.1 kg (216 lb 4.3 oz) 93.8 kg (206 lb 11.2 oz)    Exam: General exam: Appears calm and comfortable.  Respiratory system: Clear to auscultation. Respiratory effort unlabored. Cardiovascular system: S1 & S2 heard, RRR. + mild JVD,  rubs, + S4 gallop. Gastrointestinal system: Abdomen is nondistended, soft and nontender. No organomegaly or masses felt. Normal bowel sounds heard. Central nervous system: Alert and oriented. No focal neurological deficits. Extremities: No clubbing, or cyanosis. Unna boots on bilaterally, + thigh edema. RUE PICC without signs infection. Skin: Lower extremity wounds not examined, Ace wrap intact. Psychiatry: Judgement and insight appear impaired. Mood & affect depressed.   Data Reviewed:   I have personally reviewed following labs and imaging studies:  Labs: Basic Metabolic Panel:  Recent Labs Lab 07/02/16 0458 07/03/16 0359 07/04/16 0257 07/05/16 0445 07/06/16 0500  NA 133* 132* 135 132* 136  K 3.8 3.7 3.9 3.4* 3.1*  CL 94* 92* 91* 88* 87*  CO2 27 27 31  34* 38*  GLUCOSE  186* 129* 129* 104* 84  BUN 116* 120* 121* 121* 118*  CREATININE 3.56* 3.44* 3.27* 3.24* 2.97*  CALCIUM 8.4* 8.7* 8.9 8.6* 8.4*  MG 2.1 2.0 1.9 1.8 1.8  PHOS 3.7 3.9 4.3 3.8 3.0   GFR Estimated Creatinine Clearance: 29.5 mL/min (A) (by C-G formula based on SCr of 2.97 mg/dL (H)). Liver Function Tests:  Recent Labs Lab 06/30/16 1747 07/02/16 0458 07/03/16 0359 07/04/16 0257 07/05/16 0445 07/06/16 0500  AST 15  --   --   --   --   --   ALT 14*  --   --   --   --   --   ALKPHOS 78  --   --   --   --   --   BILITOT 2.8*  --   --   --   --   --   PROT 5.7*  --   --   --    --   --   ALBUMIN 1.9* 1.8* 1.9* 2.0* 2.2* 2.0*   No results for input(s): LIPASE, AMYLASE in the last 168 hours. No results for input(s): AMMONIA in the last 168 hours. Coagulation profile  Recent Labs Lab 06/29/16 1037  INR 3.20    CBC:  Recent Labs Lab 06/29/16 1037  07/02/16 0458 07/03/16 0359 07/04/16 0257 07/05/16 0445 07/06/16 0500  WBC 5.3  < > 6.1 5.3 4.9 5.0 4.1  NEUTROABS 4.2  --   --   --   --   --   --   HGB 11.8*  < > 9.6* 9.4* 9.7* 9.6* 9.8*  HCT 36.8*  < > 29.9* 29.3* 29.7* 30.7* 30.5*  MCV 74.0*  < > 72.7* 73.3* 73.9* 74.5* 75.3*  PLT 63*  < > 52* 54* 55* 56* 43*  < > = values in this interval not displayed. Cardiac Enzymes: No results for input(s): CKTOTAL, CKMB, CKMBINDEX, TROPONINI in the last 168 hours. BNP (last 3 results) No results for input(s): PROBNP in the last 8760 hours. CBG:  Recent Labs Lab 07/05/16 0111 07/05/16 0724 07/05/16 1234 07/05/16 1624 07/05/16 2024  GLUCAP 146* 96 162* 121* 71*    Microbiology Recent Results (from the past 240 hour(s))  MRSA PCR Screening     Status: None   Collection Time: 06/29/16 12:32 PM  Result Value Ref Range Status   MRSA by PCR NEGATIVE NEGATIVE Final    Comment:        The GeneXpert MRSA Assay (FDA approved for NASAL specimens only), is one component of a comprehensive MRSA colonization surveillance program. It is not intended to diagnose MRSA infection nor to guide or monitor treatment for MRSA infections.     Radiology: No results found.  Medications:   . allopurinol  100 mg Oral Daily  . amiodarone  200 mg Oral BID  . guaiFENesin  600 mg Oral BID  . insulin aspart  0-9 Units Subcutaneous TID WC  . mouth rinse  15 mL Mouth Rinse BID  . methimazole  10 mg Oral TID  . metolazone  5 mg Oral BID  . pantoprazole  40 mg Oral Q0600  . polyethylene glycol  17 g Oral Daily  . potassium chloride  10 mEq Oral TID  . sodium chloride flush  10-40 mL Intracatheter Q12H  . sodium  chloride flush  3 mL Intravenous Q12H   Continuous Infusions: . sodium chloride    . furosemide 160 mg (07/06/16 0601)  . heparin 1,450 Units/hr (07/06/16 0615)  .  milrinone 0.375 mcg/kg/min (07/06/16 0615)    Medical decision making is of high complexity and this patient is at high risk of deterioration, therefore this is a level 3 visit.  (> 4 problem points, 2 data points, high risk)    LOS: 9 days   Chandrea Zellman  [facility] Pager [phone]. If unable to reach me by pager, please call my cell phone at [phone].  *Please refer to [email or web address], password [email or web address] to get updated schedule on who will round on this patient, as hospitalists switch teams weekly. If 7PM-7AM, please contact night-coverage at [email or web address], password [email or web address] for any overnight needs.  07/06/2016, 7:22 AM

## 2016-07-06 NOTE — Progress Notes (Signed)
Foley cath 16 french inserted for aggressive IV diuretics. Patient tolerated procedure well. Strict sterile procedure maintained through procedure. Wittnessed by [employer]. Patient put out 473ml of cloudy yellow urine. Will continue to monitor closely.

## 2016-07-06 NOTE — Progress Notes (Signed)
Pt not wearing CPAP at this time. RT Will continue to monitor.

## 2016-07-07 ENCOUNTER — Inpatient Hospital Stay (HOSPITAL_COMMUNITY): Payer: Medicare Other

## 2016-07-07 DIAGNOSIS — E873 Alkalosis: Secondary | ICD-10-CM

## 2016-07-07 DIAGNOSIS — D696 Thrombocytopenia, unspecified: Secondary | ICD-10-CM

## 2016-07-07 LAB — RENAL FUNCTION PANEL
ALBUMIN: 2.1 g/dL — AB (ref 3.5–5.0)
ANION GAP: 12 (ref 5–15)
BUN: 113 mg/dL — ABNORMAL HIGH (ref 6–20)
CHLORIDE: 85 mmol/L — AB (ref 101–111)
CO2: 38 mmol/L — ABNORMAL HIGH (ref 22–32)
Calcium: 8.4 mg/dL — ABNORMAL LOW (ref 8.9–10.3)
Creatinine, Ser: 2.77 mg/dL — ABNORMAL HIGH (ref 0.61–1.24)
GFR calc Af Amer: 26 mL/min — ABNORMAL LOW (ref >60)
GFR, EST NON AFRICAN AMERICAN: 22 mL/min — AB (ref >60)
GLUCOSE: 95 mg/dL (ref 65–99)
PHOSPHORUS: 2.9 mg/dL (ref 2.5–4.6)
POTASSIUM: 3.6 mmol/L (ref 3.5–5.1)
Sodium: 135 mmol/L (ref 135–145)

## 2016-07-07 LAB — GLUCOSE, CAPILLARY
GLUCOSE-CAPILLARY: 121 mg/dL — AB (ref 65–99)
GLUCOSE-CAPILLARY: 114 mg/dL — AB (ref 65–99)
GLUCOSE-CAPILLARY: 154 mg/dL — AB (ref 65–99)
GLUCOSE-CAPILLARY: 150 mg/dL — AB (ref 65–99)

## 2016-07-07 LAB — HEPARIN LEVEL (UNFRACTIONATED): Heparin Unfractionated: 0.31 IU/mL (ref 0.30–0.70)

## 2016-07-07 LAB — CBC
HCT: 31.6 % — ABNORMAL LOW (ref 39.0–52.0)
HEMOGLOBIN: 10.1 g/dL — AB (ref 13.0–17.0)
MCH: 24.2 pg — ABNORMAL LOW (ref 26.0–34.0)
MCHC: 32 g/dL (ref 30.0–36.0)
MCV: 75.8 fL — ABNORMAL LOW (ref 78.0–100.0)
Platelets: 43 10*3/uL — ABNORMAL LOW (ref 150–400)
RBC: 4.17 MIL/uL — ABNORMAL LOW (ref 4.22–5.81)
RDW: 19.1 % — ABNORMAL HIGH (ref 11.5–15.5)
WBC: 4.2 10*3/uL (ref 4.0–10.5)

## 2016-07-07 LAB — COOXEMETRY PANEL
Carboxyhemoglobin: 1.9 % — ABNORMAL HIGH (ref 0.5–1.5)
METHEMOGLOBIN: 1.1 % (ref 0.0–1.5)
O2 SAT: 69.1 %
TOTAL HEMOGLOBIN: 10.4 g/dL — AB (ref 12.0–16.0)

## 2016-07-07 LAB — APTT: aPTT: 117 seconds — ABNORMAL HIGH (ref 24–36)

## 2016-07-07 LAB — MAGNESIUM: MAGNESIUM: 1.7 mg/dL (ref 1.7–2.4)

## 2016-07-07 MED ORDER — FUROSEMIDE 10 MG/ML IJ SOLN: 160.0000 mg | Freq: Two times a day (BID) | INTRAVENOUS | Status: DC

## 2016-07-07 MED ORDER — HEPARIN (PORCINE) IN NACL 100-0.45 UNIT/ML-% IJ SOLN
1650.0000 [IU]/h | INTRAMUSCULAR | Status: DC
  Administered 2016-07-08: 1650 [IU]/h via INTRAVENOUS
  Filled 2016-07-07 (×2): qty 250

## 2016-07-07 MED ORDER — MAGNESIUM SULFATE 2 GM/50ML IV SOLN
2.0000 g | Freq: Once | INTRAVENOUS | Status: AC
  Administered 2016-07-07: 2 g via INTRAVENOUS
  Filled 2016-07-07: qty 50

## 2016-07-07 NOTE — Progress Notes (Signed)
S: Breathing better.  Eating well O:BP 111/71   Pulse 79   Temp 98 F (36.7 C) (Oral)   Resp 18   Ht 6' (1.829 m) Comment: per pt previously documented 6' 2: was incorrect  Wt 91.8 kg (202 lb 6.4 oz)   SpO2 100%   BMI 27.45 kg/m   Intake/Output Summary (Last 24 hours) at 07/07/16 0933 Last data filed at 07/07/16 0900  Gross per 24 hour  Intake            789.2 ml  Output             3175 ml  Net          -2385.8 ml   Weight change: -1.95 kg (-4 lb 4.8 oz) TRR:NHAFB and alert CVS: RRR Resp: Basilar crackles Abd:+ BS NT + ascites Ext: Legs wrapped in ACE bandage NEURO:CNI Ox3 no asterixis   . acetaZOLAMIDE  250 mg Oral BID  . allopurinol  100 mg Oral Daily  . amiodarone  200 mg Oral BID  . guaiFENesin  600 mg Oral BID  . insulin aspart  0-9 Units Subcutaneous TID WC  . mouth rinse  15 mL Mouth Rinse BID  . methimazole  10 mg Oral TID  . metolazone  5 mg Oral Daily  . pantoprazole  40 mg Oral Q0600  . polyethylene glycol  17 g Oral Daily  . potassium chloride  40 mEq Oral BID  . sodium chloride flush  10-40 mL Intracatheter Q12H  . sodium chloride flush  3 mL Intravenous Q12H   No results found. BMET    Component Value Date/Time   NA 135 07/07/2016 0153   K 3.6 07/07/2016 0153   CL 85 (L) 07/07/2016 0153   CO2 38 (H) 07/07/2016 0153   GLUCOSE 95 07/07/2016 0153   BUN 113 (H) 07/07/2016 0153   CREATININE 2.77 (H) 07/07/2016 0153   CALCIUM 8.4 (L) 07/07/2016 0153   GFRNONAA 22 (L) 07/07/2016 0153   GFRAA 26 (L) 07/07/2016 0153   CBC    Component Value Date/Time   WBC 4.2 07/07/2016 0153   RBC 4.17 (L) 07/07/2016 0153   HGB 10.1 (L) 07/07/2016 0153   HCT 31.6 (L) 07/07/2016 0153   PLT 43 (L) 07/07/2016 0153   MCV 75.8 (L) 07/07/2016 0153   MCH 24.2 (L) 07/07/2016 0153   MCHC 32.0 07/07/2016 0153   RDW 19.1 (H) 07/07/2016 0153   LYMPHSABS 0.7 06/29/2016 1037   MONOABS 0.3 06/29/2016 1037   EOSABS 0.1 06/29/2016 1037   BASOSABS 0.0 06/29/2016 1037      Assessment:  1. Acute on CKD 3, Scr trending down. (Baseline Scr 1.6-1.8), UO excellent 2. Severe cardiomyopathy 3. Hyperthyroidism 4. Paroxysmal A fib, now NSR 5. Chronic thrombocytopenia 6. Met Alkalosis (presumed as no ABG's), on diamox  Plan: 1. Cont IV lasix for 1-2 more days 2. Daily labs   Caleb Bauer T

## 2016-07-07 NOTE — Progress Notes (Signed)
Patient ID: Caleb Bauer, male   DOB: [birth year], 66 y.o.   MRN: [id number]     Advanced Heart Failure Rounding Note   Subjective:    Caleb Bauer is a 66 year old with a history of DMII, HTN, HF, COPD, former ETOH abuse (stopped drinking 2014), noncompliance, and systolic heart failure admitted with volume overload and AKI in the setting on medication noncompliance.    Creatinine down to 2.77. Coox 69.1% on milrinone 0.375 mcg/kg/min.   Feeling OK this am. Denies SOB. Swelling improving but remains mild.   Out 2.6 L and down 4 more lbs.   Objective:   Weight Range: 202 lb 6.4 oz (91.8 kg) Body mass index is 27.45 kg/m.   Vital Signs:   Temp:  [97.3 F (36.3 C)-98.7 F (37.1 C)] 98 F (36.7 C) (05/07 0715) Pulse Rate:  [79-92] 79 (05/07 0715) Resp:  [12-23] 18 (05/07 0715) BP: (90-114)/(61-74) 111/71 (05/07 0715) SpO2:  [91 %-100 %] 100 % (05/07 0715) Weight:  [202 lb 6.4 oz (91.8 kg)] 202 lb 6.4 oz (91.8 kg) (05/07 0436) Last BM Date: 07/06/16  Weight change: Filed Weights   07/05/16 0500 07/06/16 0515 07/07/16 0436  Weight: 216 lb 4.3 oz (98.1 kg) 206 lb 11.2 oz (93.8 kg) 202 lb 6.4 oz (91.8 kg)    Intake/Output:   Intake/Output Summary (Last 24 hours) at 07/07/16 0754 Last data filed at 07/07/16 0700  Gross per 24 hour  Intake          1081.98 ml  Output             3775 ml  Net         -2693.02 ml     Physical Exam: CVP 10-11  General: Lying in bed, NAD.  HEENT: normal Neck: supple. JVP ~10 cm. Carotids 2+ bilat; no bruits. No thyromegaly or nodule noted. Cor: PMI laterally displaced. RRR,. 2/6 SEM LSB, + S3  Lungs: Clear, normal effort.  Abdomen: soft, non-tender, distended, no HSM. No bruits or masses. +BS  Extremities: no cyanosis, clubbing, or rash. BLE compression wraps with 1+ edema.   Neuro: alert & orientedx3, cranial nerves grossly intact. moves all 4 extremities w/o difficulty.  Off affect.    Telemetry: Personally reviewed, NSR 80s    Labs: CBC  Recent Labs  07/06/16 0500 07/07/16 0153  WBC 4.1 4.2  HGB 9.8* 10.1*  HCT 30.5* 31.6*  MCV 75.3* 75.8*  PLT 43* 43*   Basic Metabolic Panel  Recent Labs  07/06/16 0500 07/06/16 1500 07/07/16 0153  NA 136 134* 135  K 3.1* 3.4* 3.6  CL 87* 86* 85*  CO2 38* 37* 38*  GLUCOSE 84 163* 95  BUN 118* 113* 113*  CREATININE 2.97* 2.90* 2.77*  CALCIUM 8.4* 8.4* 8.4*  MG 1.8  --  1.7  PHOS 3.0  --  2.9   Liver Function Tests  Recent Labs  07/06/16 0500 07/07/16 0153  ALBUMIN 2.0* 2.1*   No results for input(s): LIPASE, AMYLASE in the last 72 hours. Cardiac Enzymes No results for input(s): CKTOTAL, CKMB, CKMBINDEX, TROPONINI in the last 72 hours.  BNP: BNP (last 3 results)  Recent Labs  06/27/16 0349  BNP >4,500.0*    ProBNP (last 3 results) No results for input(s): PROBNP in the last 8760 hours.   D-Dimer No results for input(s): DDIMER in the last 72 hours. Hemoglobin A1C No results for input(s): HGBA1C in the last 72 hours. Fasting Lipid Panel No results for  input(s): CHOL, HDL, LDLCALC, TRIG, CHOLHDL, LDLDIRECT in the last 72 hours. Thyroid Function Tests No results for input(s): TSH, T4TOTAL, T3FREE, THYROIDAB in the last 72 hours.  Invalid input(s): FREET3  Other results:     Imaging/Studies:  No results found.    Medications:     Scheduled Medications: . acetaZOLAMIDE  250 mg Oral BID  . allopurinol  100 mg Oral Daily  . amiodarone  200 mg Oral BID  . guaiFENesin  600 mg Oral BID  . insulin aspart  0-9 Units Subcutaneous TID WC  . mouth rinse  15 mL Mouth Rinse BID  . methimazole  10 mg Oral TID  . metolazone  5 mg Oral Daily  . pantoprazole  40 mg Oral Q0600  . polyethylene glycol  17 g Oral Daily  . potassium chloride  40 mEq Oral BID  . sodium chloride flush  10-40 mL Intracatheter Q12H  . sodium chloride flush  3 mL Intravenous Q12H    Infusions: . sodium chloride    . furosemide 160 mg (07/07/16 0523)  .  heparin 1,450 Units/hr (07/06/16 1957)  . milrinone 0.375 mcg/kg/min (07/07/16 0251)    PRN Medications: sodium chloride, acetaminophen, albuterol, alum & mag hydroxide-simeth, bisacodyl, guaiFENesin-dextromethorphan, ondansetron (ZOFRAN) IV, sodium chloride flush, sodium chloride flush, traMADol   Assessment/Plan   1. Acute on chronic systolic CHF: Nonischemic cardiomyopathy.  Echo 4/18 with EF 20%, moderately dilated RV, moderate-severe Caleb and TR. Low output HF with creatinine up considerably.   - Remains volume overloaded on exam and tenuous but improving with inotrope support and diuresis. Weight down 4 lbs overnight.  - Coox 69.1% on milrinone 0.375 mcg/kg/min.  - Remains on lasix 160 mg q 8 hrs and metolazone 5 mg daily per renal.   Would diurese at least one more day most likely.  - Continue diamox 250 mg BID.  - Renal function slowly improving. BUN remains markedly elevated. Nephrology following. No acute HD indication. - No beta blocker with low output. No Ace/Arb/Dig/spiro with AKI.  - Pressures more stable today so may be able to add low dose hydral/imdur? Pressures have beens as low as 90 systolic in past 24 hrs. Likely hold current course.  - Will need to wean inotrope slowly.  - Not candidate for advanced therapies. Lives alone and strong history of non-compliance. Currently with significant AKI.  2. Hyperthyroidism: New diagnosis. ? Related to amio.   - Continue prednisone and methimazole as treatment of amiodarone-induced hyperthyroidism. No change.  3. NSVT: NSVT and PVCs on milrinone.  Per EP, no definite benefit at this point to stopping amiodarone and may make hyperthyroidism worse by increasing conversion of T4=>T3.  Therefore, have elected not to use mexiletine and to keep him on amiodarone.  - Continue amio 200 mg BID.  - Hyperthyroidism treated with prednisone and methimazole.  4. Atrial fibrillation:  Paroxysmal.  - NSR on tele. Platelets down but stable at 43k.  -  Continue po amio as above.  - Continue heparin gtt. Pharm managing. Eliquis on hold.  5. Thrombocytopenia:  Doubt HIT as this preceded admission, looking back he has had thrombocytopenia since 3/17. Platelets remain low at 43k today. ? Cirrhosis but CT in 2014 comments on normal liver. Platelets normal in 2016 but 98k in 3/17 - RUQ [place] pending to further evaluate liver.  6. AKI on CKD stage 3:  - Gradually improving with inotropes and diuresis.  7. Hypokalemia - Continue supp. Discussed with Pharm D. Will not increase  with BUN.   Length of Stay: [address]  [name]  07/07/2016, 7:54 AM  Advanced Heart Failure Team Pager [phone] (M-F; 7a - 4p)  Please contact [facility] for night-coverage after hours (4p -7a ) and weekends on [email or web address]  Patient seen and examined with the above-signed Advanced Practice Provider and/or Housestaff. I personally reviewed laboratory data, imaging studies and relevant notes. I independently examined the patient and formulated the important aspects of the plan. I have edited the note to reflect any of my changes or salient points. I have personally discussed the plan with the patient and/or family.  He remains on milrinone and IV lasix. Has diuresed well again throughout the day. CVP checked personally and is now 5-6. BP tenuous in 80-90 range. CO-ox ok. Renal function up but improving. Will stop diuretics tonight. Continue milrinone.   Likely begin milrinone wean in am. Will supp K+ slowly. Discussed with PharmD. Remains in NSR. Continue heparin gtt.   Glori Bickers, MD  10:09 PM

## 2016-07-07 NOTE — Progress Notes (Signed)
Bed offers given to patient- no choice at this time  Jorge Ny, [place] Social Worker [phone]

## 2016-07-07 NOTE — Progress Notes (Signed)
Physical Therapy Treatment Patient Details Name: Caleb Bauer MRN: [id number] DOB: 05-Aug-1950 Today's Date: 07/07/2016    History of Present Illness Patient is a 66 yo male admitted 06/27/16 with SOB and LE edema/cellulitis.  Patient with CHF, acute on CKD, and LE wounds/blisters.     PMH:  BLE cellulitis to blisters and skin loss, Lt posterior leg wound, COPD, DM, HTN, CHF, NICM, EF 20%, obesity, CKD    PT Comments    Patient very lethargic this session after being woken up from a nap. Only agreeable to get to chair to eat lunch. Requires increased time and cues to arouse. Difficulty maintaining attention today due to drowsiness. No assist needed to get to EOB and to transfer to chair except Min guard for safety. BP soft  97/61 supine in bed and 107/74 sitting EOB. Sp02 remained >90% on RA. Continues to report pain in BLEs. Encouraged ambulation with RN later today. Will follow.   Follow Up Recommendations  Supervision for mobility/OOB;SNF     Equipment Recommendations  None recommended by PT    Recommendations for Other Services       Precautions / Restrictions Precautions Precautions: Fall Restrictions Weight Bearing Restrictions: No Other Position/Activity Restrictions: Keep LE's elevated for edema reduction    Mobility  Bed Mobility Overal bed mobility: Needs Assistance Bed Mobility: Supine to Sit     Supine to sit: Min guard;HOB elevated     General bed mobility comments: Increased time and cues to get to EOB due to lethargy. Difficulty getting LEs to EOB.  Transfers Overall transfer level: Needs assistance Equipment used: Rolling walker (2 wheeled) Transfers: Sit to/from Stand Sit to Stand: Min guard         General transfer comment: Min guard for safety; stood from EOB x1.   Ambulation/Gait Ambulation/Gait assistance: Min guard Ambulation Distance (Feet): 10 Feet Assistive device: Rolling walker (2 wheeled) Gait Pattern/deviations: Step-through  pattern;Decreased stride length;Trunk flexed Gait velocity: decreased Gait velocity interpretation: Below normal speed for age/gender General Gait Details: Slow, steady gait with use of RW; Sp02 remained >90% on RA. Declined further ambulation due to wanting to eat lunch.   Stairs            Wheelchair Mobility    Modified Rankin (Stroke Patients Only)       Balance Overall balance assessment: Needs assistance Sitting-balance support: Feet supported;No upper extremity supported Sitting balance-Leahy Scale: Good     Standing balance support: During functional activity Standing balance-Leahy Scale: Fair Standing balance comment: requires BUE support in standing today due to lethargy                            Cognition Arousal/Alertness: Lethargic Behavior During Therapy: WFL for tasks assessed/performed Overall Cognitive Status: Within Functional Limits for tasks assessed                                        Exercises      General Comments General comments (skin integrity, edema, etc.): BLEs wrapped.      Pertinent Vitals/Pain Pain Assessment: Faces Faces Pain Scale: Hurts a little bit Pain Location: BLEs with movement Pain Descriptors / Indicators: Sore;Grimacing Pain Intervention(s): Monitored during session;Repositioned    Home Living  Prior Function            PT Goals (current goals can now be found in the care plan section) Progress towards PT goals: Progressing toward goals    Frequency    Min 3X/week      PT Plan Current plan remains appropriate    Co-evaluation              AM-PAC PT "6 Clicks" Daily Activity  Outcome Measure  Difficulty turning over in bed (including adjusting bedclothes, sheets and blankets)?: None Difficulty moving from lying on back to sitting on the side of the bed? : None Difficulty sitting down on and standing up from a chair with arms (e.g.,  wheelchair, bedside commode, etc,.)?: A Little Help needed moving to and from a bed to chair (including a wheelchair)?: A Little Help needed walking in hospital room?: A Little Help needed climbing 3-5 steps with a railing? : A Little 6 Click Score: 20    End of Session   Activity Tolerance: Patient limited by lethargy Patient left: in chair;with call bell/phone within reach Nurse Communication: Mobility status PT Visit Diagnosis: Unsteadiness on feet (R26.81);Other abnormalities of gait and mobility (R26.89);Muscle weakness (generalized) (M62.81) Pain - Right/Left:  (both) Pain - part of body: Leg     Time: 1349-1410 PT Time Calculation (min) (ACUTE ONLY): 21 min  Charges:  $Therapeutic Activity: 8-22 mins                    G Codes:       Wray Kearns, PT, DPT [phone]     [place] 07/07/2016, 3:08 PM

## 2016-07-07 NOTE — Progress Notes (Signed)
Progress Note    [name]  [name]:[id number] DOB: 02/21/1951  DOA: 06/27/2016 PCP: [name], MD    Brief Narrative:   Chief complaint: Follow-up chest pain, shortness of breath and lower extremity edema  Medical records reviewed and are as summarized below:  Caleb Bauer is an 66 y.o. 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 In the ED, he was given 80 mg of Lasix and 324 mg of aspirin and referred to [facility] for further care. BNP greater than 4500 on admission. 2-D echo showed an EF of 20% with moderate to severe MR and a moderately dilated RV. Not felt to be a candidate for ICD or pacemaker due to history of noncompliance and multiple medical comorbidities. PICC line placed 06/30/16 with cardiology following co-oximetry readings.  Assessment/Plan:   Principal Problem:   Acute respiratory failure secondary to Acute on chronic combined systolic (congestive) and diastolic (congestive) heart failure (HCC)/Bilateral leg edema/Anasarca Medications being managed by cardiology with current regimen consisting of amiodarone, Zaroxolyn, Primacor and high dose Lasix. Beta blocker contraindicated due to low output Weight down another 4 pounds, I/O balance -2771 mL. CVP 10. Lasix and metolazone dosed per cardiologist, but it appears that there is no further room for up titration, so may need CRRT, but no indication yet per nephrologist. Co-Oximetry 69% Continue supplemental oxygen. He will likely require home oxygen. Continue cardiac rehab.   Active Problems:   Metabolic  alkalosis Secondary to diuretics. Diamox added by cardiology 07/06/16. Bicarbonate 38.    Hypokalemia Secondary to aggressive diuresis. Potassium WNL after aggressive supplementation.    Mild hyponatremia Likely reflective of CHF physiology. Sodium remains WNL.    Hypertensive heart disease Blood pressure remains controlled on regimen as noted above: 100-110s/70s.    Diabetes mellitus without complication (HCC) Hemoglobin A1c 6.2%. Currently being managed with insulin sensitive SSI 3 times a day. Blood sugars controlled with a range of 98-162.    COPD (chronic obstructive pulmonary disease) (HCC) Continue Mucinex and bronchodilators. Remains stable.    Atrial flutter by electrocardiogram ([place]) Initially managed with heparin drip, subsequently discontinued secondary to thrombocytopenia. Resumed 07/04/16. Platelet count remains 43, continue to monitor closely.    GERD (gastroesophageal reflux disease) Continue Protonix since the patient is on 40 mg of prednisone daily.    Iron deficiency anemia Hemoglobin stable, 10.1 mg/dL today.    Chronic gouty arthritis Continue allopurinol.    Sleep apnea Continue CPAP daily at bedtime.    Chest pain Troponin was mildly elevated, trend was flat. Currently denies chest pain.    Diabetic ulcers Chronic full thickness wound on the left posterior leg with superimposed cellulitis. Evaluated by wound care nurse with recommendations for wound care noted.    AKI (acute kidney injury) (HCC)/stage III CKD/cardiorenal syndrome Felt to have cardiorenal syndrome with poor cardiac output. Evaluated by nephrology 06/30/16. Continue diuretics and inotropes.  Eliquis discontinued secondary to impaired renal function. Creatinine slowly trending down, 2.77 today. No ACE-I/ARB/Digoxin/spironolactone with AKI    Ventricular tachycardia, nonsustained (HCC) Continue to monitor on telemetry. On milrinone.    Amiodarone-induced thyroiditis/Other thyrotoxicosis  without thyrotoxic crisis or  storm Okay to continue amiodarone per cardiology, treating hyperthyroidism with prednisone plus methimazole.    Chronic thrombocytopenia Unclear etiology, questionable liver disease. Platelet count remains 43 today. We'll get right upper quadrant ultrasound to evaluate liver.    Obesity/physical deconditioning Body mass index is 27.45 kg/m. PT/OT, SNF placement when stable.   Family Communication/Anticipated D/C date and plan/Code Status   DVT prophylaxis: Heparin resumed 07/04/16. Code Status: Full Code.  Family Communication: No family present at the bedside. Disposition Plan: SNF recommended by PT.   Medical Consultants:    Cardiology/advanced heart failure team  Electrophysiology  Nephrology   Procedures:   PICC line placement   2-D echocardiogram  Left ventricle: The cavity size was moderately dilated. Wall   thickness was normal. The estimated ejection fraction was 20%.   Diffuse hypokinesis. Doppler parameters are consistent with both   elevated ventricular end-diastolic filling pressure and elevated   left atrial filling pressure. - Mitral valve: There was moderate to severe regurgitation. Valve   area by pressure half-time: 0.85 cm^2. - Left atrium: The atrium was moderately dilated. - Right ventricle: The cavity size was moderately dilated. - Right atrium: The atrium was moderately dilated. - Atrial septum: No defect or patent foramen ovale was identified. - Tricuspid valve: There was moderate-severe regurgitation. - Pulmonary arteries: PA peak pressure: 37 mm Hg (S).  Anti-Infectives:    None  Subjective:   Continues to deny dyspnea, pain, nausea, vomiting.  Appetite good, bowels moving.     Objective:    Vitals:   07/07/16 0313 07/07/16 0400 07/07/16 0436 07/07/16 0715  BP: 101/72 102/71  111/71  Pulse: 82 80  79  Resp: 20 18  18   Temp: 97.4 F (36.3 C)   98 F (36.7 C)  TempSrc: Oral   Oral  SpO2: 96% 99%   100%  Weight:   91.8 kg (202 lb 6.4 oz)   Height:        Intake/Output Summary (Last 24 hours) at 07/07/16 0717 Last data filed at 07/07/16 0400  Gross per 24 hour  Intake          1003.68 ml  Output             3775 ml  Net         -2771.32 ml   Filed Weights   07/05/16 0500 07/06/16 0515 07/07/16 0436  Weight: 98.1 kg (216 lb 4.3 oz) 93.8 kg (206 lb 11.2 oz) 91.8 kg (202 lb 6.4 oz)    Exam: General exam: Appears calm and comfortable.  Respiratory system: Clear to auscultation. Respiratory effort unlabored. Cardiovascular system: S1 & S2 heard, RRR. No rubs, III/VI murmur. Gastrointestinal system: Abdomen is nondistended, soft and nontender. No organomegaly or masses felt. Normal bowel sounds heard. Central nervous system: Alert and oriented. No focal neurological deficits. Extremities: No clubbing, or cyanosis. Unna boots on bilaterally, + thigh edema. RUE PICC without signs infection. Skin: Lower extremity wounds not examined, Ace wrap intact. Scattered ecchymosis. Psychiatry: Judgement and insight appear impaired. Mood & affect depressed/flat.   Data Reviewed:   I have personally reviewed following labs and imaging studies:  Labs: Basic Metabolic Panel:  Recent Labs Lab 07/03/16 0359 07/04/16 0257 07/05/16 0445 07/06/16 0500 07/06/16 1500 07/07/16 0153  NA 132* 135 132* 136 134* 135  K 3.7 3.9 3.4* 3.1* 3.4* 3.6  CL 92* 91* 88* 87* 86* 85*  CO2 27 31 34* 38* 37* 38*  GLUCOSE 129* 129* 104* 84 163* 95  BUN 120* 121* 121* 118* 113* 113*  CREATININE 3.44* 3.27* 3.24* 2.97* 2.90* 2.77*  CALCIUM 8.7* 8.9 8.6* 8.4* 8.4* 8.4*  MG 2.0 1.9 1.8 1.8  --  1.7  PHOS 3.9 4.3 3.8 3.0  --  2.9   GFR Estimated Creatinine Clearance: 29.2 mL/min (A) (by C-G formula based on SCr of 2.77 mg/dL (H)). Liver Function Tests:  Recent Labs Lab 06/30/16 1747  07/03/16 0359 07/04/16 0257 07/05/16 0445 07/06/16 0500 07/07/16 0153  AST 15  --   --   --   --   --   --   ALT 14*   --   --   --   --   --   --   ALKPHOS 78  --   --   --   --   --   --   BILITOT 2.8*  --   --   --   --   --   --   PROT 5.7*  --   --   --   --   --   --   ALBUMIN 1.9*  < > 1.9* 2.0* 2.2* 2.0* 2.1*  < > = values in this interval not displayed. No results for input(s): LIPASE, AMYLASE in the last 168 hours. No results for input(s): AMMONIA in the last 168 hours. Coagulation profile No results for input(s): INR, PROTIME in the last 168 hours.  CBC:  Recent Labs Lab 07/03/16 0359 07/04/16 0257 07/05/16 0445 07/06/16 0500 07/07/16 0153  WBC 5.3 4.9 5.0 4.1 4.2  HGB 9.4* 9.7* 9.6* 9.8* 10.1*  HCT 29.3* 29.7* 30.7* 30.5* 31.6*  MCV 73.3* 73.9* 74.5* 75.3* 75.8*  PLT 54* 55* 56* 43* 43*   Cardiac Enzymes: No results for input(s): CKTOTAL, CKMB, CKMBINDEX, TROPONINI in the last 168 hours. BNP (last 3 results) No results for input(s): PROBNP in the last 8760 hours. CBG:  Recent Labs Lab 07/06/16 0823 07/06/16 1141 07/06/16 1720 07/06/16 1757 07/06/16 2130  GLUCAP 98 104* 162* 155* 115*    Microbiology Recent Results (from the past 240 hour(s))  MRSA PCR Screening     Status: None   Collection Time: 06/29/16 12:32 PM  Result Value Ref Range Status   MRSA by PCR NEGATIVE NEGATIVE Final    Comment:        The GeneXpert MRSA Assay (FDA approved for NASAL specimens only), is one component of a comprehensive MRSA colonization surveillance program. It is not intended to diagnose MRSA infection nor to guide or monitor treatment for MRSA infections.     Radiology: No results found.  Medications:   . acetaZOLAMIDE  250 mg Oral BID  . allopurinol  100 mg Oral Daily  . amiodarone  200 mg Oral BID  . guaiFENesin  600 mg Oral BID  . insulin aspart  0-9 Units Subcutaneous TID WC  . mouth rinse  15 mL Mouth Rinse BID  . methimazole  10 mg Oral TID  . metolazone  5 mg Oral Daily  . pantoprazole  40 mg Oral Q0600  . polyethylene glycol  17 g Oral Daily  . potassium  chloride  40 mEq Oral BID  . sodium chloride flush  10-40 mL Intracatheter Q12H  . sodium chloride flush  3 mL Intravenous Q12H   Continuous Infusions: . sodium chloride    . furosemide 160 mg (07/07/16 0523)  . heparin 1,450 Units/hr (07/06/16 1957)  . milrinone 0.375 mcg/kg/min (07/07/16 0251)    Medical decision making is  of high complexity and this patient is at high risk of deterioration, therefore this is a level 3 visit.  (> 4 problem points, 2 data points, high risk)    LOS: 10 days   Myrtie Leuthold  [facility] Pager [phone]. If unable to reach me by pager, please call my cell phone at [phone].  *Please refer to [email or web address], password [email or web address] to get updated schedule on who will round on this patient, as hospitalists switch teams weekly. If 7PM-7AM, please contact night-coverage at [email or web address], password [email or web address] for any overnight needs.  07/07/2016, 7:17 AM

## 2016-07-07 NOTE — Progress Notes (Signed)
ANTICOAGULATION CONSULT NOTE - Follow Up Consult  Pharmacy Consult for Heparin (Apixaban on hold) Indication: atrial fibrillation  Allergies  Allergen Reactions  . Other Nausea And Vomiting    Patient stated drug is "kerein"? Formulation, dose, indication?    Patient Measurements: Height: 6' (182.9 cm) (per pt previously documented 6' 2: was incorrect) Weight: 202 lb 6.4 oz (91.8 kg) IBW/kg (Calculated) : 77.6  Vital Signs: Temp: 98 F (36.7 C) (05/07 0715) Temp Source: Oral (05/07 0715) BP: 111/71 (05/07 0715) Pulse Rate: 79 (05/07 0715)  Labs:  Recent Labs  07/05/16 0445 07/05/16 0900 07/05/16 2100 07/06/16 0500 07/06/16 1500 07/07/16 0153  HGB 9.6*  --   --  9.8*  --  10.1*  HCT 30.7*  --   --  30.5*  --  31.6*  PLT 56*  --   --  43*  --  43*  APTT  --  61*  --  123*  --  117*  HEPARINUNFRC  --  0.24* 0.42 0.36  --  0.31  CREATININE 3.24*  --   --  2.97* 2.90* 2.77*    Estimated Creatinine Clearance: 29.2 mL/min (A) (by C-G formula based on SCr of 2.77 mg/dL (H)).   Medications:  Heparin @ 1450 units/hr  Assessment: 65yom continues on heparin for afib while apixaban on hold in setting of AKI. AKI improving. Heparin level is therapeutic at 0.31 but trending down - will increase rate slightly. CBC stable. No bleeding.  Goal of Therapy:  Heparin level 0.3-0.7 units/ml Monitor platelets by anticoagulation protocol: Yes   Plan:  1) Increase heparin to 1500 units/hr 2) Daily heparin level and CBC 3) Follow up transition back to apixaban  Deboraha Sprang 07/07/2016,11:20 AM

## 2016-07-08 ENCOUNTER — Encounter: Payer: Medicare Other | Admitting: Podiatry

## 2016-07-08 DIAGNOSIS — R601 Generalized edema: Secondary | ICD-10-CM

## 2016-07-08 LAB — COOXEMETRY PANEL
CARBOXYHEMOGLOBIN: 2.6 % — AB (ref 0.5–1.5)
METHEMOGLOBIN: 0.8 % (ref 0.0–1.5)
O2 Saturation: 67.5 %
Total hemoglobin: 10.4 g/dL — ABNORMAL LOW (ref 12.0–16.0)

## 2016-07-08 LAB — RENAL FUNCTION PANEL
ALBUMIN: 2.1 g/dL — AB (ref 3.5–5.0)
ANION GAP: 11 (ref 5–15)
BUN: 111 mg/dL — AB (ref 6–20)
CALCIUM: 8.6 mg/dL — AB (ref 8.9–10.3)
CO2: 39 mmol/L — ABNORMAL HIGH (ref 22–32)
Chloride: 85 mmol/L — ABNORMAL LOW (ref 101–111)
Creatinine, Ser: 2.82 mg/dL — ABNORMAL HIGH (ref 0.61–1.24)
GFR calc Af Amer: 25 mL/min — ABNORMAL LOW (ref >60)
GFR, EST NON AFRICAN AMERICAN: 22 mL/min — AB (ref >60)
GLUCOSE: 109 mg/dL — AB (ref 65–99)
PHOSPHORUS: 3.2 mg/dL (ref 2.5–4.6)
POTASSIUM: 3.9 mmol/L (ref 3.5–5.1)
SODIUM: 135 mmol/L (ref 135–145)

## 2016-07-08 LAB — CBC
HCT: 33.1 % — ABNORMAL LOW (ref 39.0–52.0)
Hemoglobin: 10.2 g/dL — ABNORMAL LOW (ref 13.0–17.0)
MCH: 23.2 pg — AB (ref 26.0–34.0)
MCHC: 30.8 g/dL (ref 30.0–36.0)
MCV: 75.2 fL — AB (ref 78.0–100.0)
PLATELETS: 45 10*3/uL — AB (ref 150–400)
RBC: 4.4 MIL/uL (ref 4.22–5.81)
RDW: 19.2 % — ABNORMAL HIGH (ref 11.5–15.5)
WBC: 6.1 10*3/uL (ref 4.0–10.5)

## 2016-07-08 LAB — HEPARIN LEVEL (UNFRACTIONATED): Heparin Unfractionated: 0.29 IU/mL — ABNORMAL LOW (ref 0.30–0.70)

## 2016-07-08 LAB — GLUCOSE, CAPILLARY
GLUCOSE-CAPILLARY: 110 mg/dL — AB (ref 65–99)
Glucose-Capillary: 121 mg/dL — ABNORMAL HIGH (ref 65–99)
Glucose-Capillary: 126 mg/dL — ABNORMAL HIGH (ref 65–99)

## 2016-07-08 LAB — MAGNESIUM: MAGNESIUM: 1.8 mg/dL (ref 1.7–2.4)

## 2016-07-08 MED ORDER — MILRINONE LACTATE IN DEXTROSE 20-5 MG/100ML-% IV SOLN
0.2500 ug/kg/min | INTRAVENOUS | Status: DC
  Administered 2016-07-08 – 2016-07-09 (×2): 0.25 ug/kg/min via INTRAVENOUS
  Filled 2016-07-08 (×2): qty 100

## 2016-07-08 MED ORDER — APIXABAN 5 MG PO TABS
5.0000 mg | ORAL_TABLET | Freq: Two times a day (BID) | ORAL | Status: DC
  Administered 2016-07-08 – 2016-07-09 (×4): 5 mg via ORAL
  Filled 2016-07-08 (×4): qty 1

## 2016-07-08 NOTE — Progress Notes (Signed)
This encounter was created in error - please disregard.

## 2016-07-08 NOTE — Progress Notes (Signed)
Came to ambulate however pt flatly refused. I attempted to encourage him but he would not engage in discussion with me. RN sts he is in a bad mood today. Will continue to make him low priority. Yves Dill CES, ACSM 1:30 PM 07/08/2016
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
## 2016-07-08 NOTE — Progress Notes (Signed)
Patient ID: Caleb Bauer, male   DOB: 1950-09-13, 66 y.o.   MRN: [id number]     Advanced Heart Failure Rounding Note   Subjective:    Caleb Bauer is a 66 year old with a history of DMII, HTN, HF, COPD, former ETOH abuse (stopped drinking 2014), noncompliance, and systolic heart failure admitted with volume overload and AKI in the setting on medication noncompliance.    Feels good. Lying flat in bed. No SOB or orthopnea. + dry cough. CVP 2-3  Co-ox 68%.   Lasix stopped last night. Cr stable at 2.8 BUN remains high   Objective:   Weight Range: 89.6 kg (197 lb 8 oz) Body mass index is 26.79 kg/m.   Vital Signs:   Temp:  [96.5 F (35.8 C)-98.1 F (36.7 C)] 96.5 F (35.8 C) (05/08 0400) Pulse Rate:  [71-85] 74 (05/08 0400) Resp:  [11-26] 17 (05/08 0600) BP: (82-111)/(60-77) 111/70 (05/08 0600) SpO2:  [91 %-100 %] 92 % (05/08 0600) Weight:  [89.6 kg (197 lb 8 oz)] 89.6 kg (197 lb 8 oz) (05/08 0400) Last BM Date: 07/06/16  Weight change: Filed Weights   07/06/16 0515 07/07/16 0436 07/08/16 0400  Weight: 93.8 kg (206 lb 11.2 oz) 91.8 kg (202 lb 6.4 oz) 89.6 kg (197 lb 8 oz)    Intake/Output:   Intake/Output Summary (Last 24 hours) at 07/08/16 0719 Last data filed at 07/08/16 0700  Gross per 24 hour  Intake           688.73 ml  Output             5950 ml  Net         -5261.27 ml     Physical Exam: CVP 2  General: Lying in bed, NAD.  HEENT: normal anicteric Poor dentition Neck: supple. JVP flat. Carotids 2+ bilat; no bruits. No thyromegaly or nodule noted. Cor: PMI laterally displaced.RRR +S2  Lungs: Clear, normal effort. +cough. No wheeze Abdomen: soft, NT/NS no HSM. No bruits or masses. Good BS Extremities: no cyanosis, clubbing, or rash. UNNA boots trace edema  Neuro: alert & orientedx3, cranial nerves grossly intact. moves all 4 extremities w/o difficulty.  Off affect.    Telemetry: NSR 70-80s Personally reviewed  Labs: CBC  Recent Labs  07/07/16 0153  07/08/16 0402  WBC 4.2 6.1  HGB 10.1* 10.2*  HCT 31.6* 33.1*  MCV 75.8* 75.2*  PLT 43* 45*   Basic Metabolic Panel  Recent Labs  07/07/16 0153 07/08/16 0402  NA 135 135  K 3.6 3.9  CL 85* 85*  CO2 38* 39*  GLUCOSE 95 109*  BUN 113* 111*  CREATININE 2.77* 2.82*  CALCIUM 8.4* 8.6*  MG 1.7 1.8  PHOS 2.9 3.2   Liver Function Tests  Recent Labs  07/07/16 0153 07/08/16 0402  ALBUMIN 2.1* 2.1*   No results for input(s): LIPASE, AMYLASE in the last 72 hours. Cardiac Enzymes No results for input(s): CKTOTAL, CKMB, CKMBINDEX, TROPONINI in the last 72 hours.  BNP: BNP (last 3 results)  Recent Labs  06/27/16 0349  BNP >4,500.0*    ProBNP (last 3 results) No results for input(s): PROBNP in the last 8760 hours.   D-Dimer No results for input(s): DDIMER in the last 72 hours. Hemoglobin A1C No results for input(s): HGBA1C in the last 72 hours. Fasting Lipid Panel No results for input(s): CHOL, HDL, LDLCALC, TRIG, CHOLHDL, LDLDIRECT in the last 72 hours. Thyroid Function Tests No results for input(s): TSH, T4TOTAL, T3FREE, THYROIDAB  in the last 72 hours.  Invalid input(s): FREET3  Other results:     Imaging/Studies:  US Abdomen Limited Ruq  Result Date: 07/07/2016 CLINICAL DATA:  Elevated liver function studies, thrombocytopenia, history of diabetes. EXAM: US ABDOMEN LIMITED - RIGHT UPPER QUADRANT COMPARISON:  Abdominal and pelvic CT scan of October 08, 2012 FINDINGS: The study is somewhat limited due to the patient's body habitus. Gallbladder: The gallbladder is adequately distended. There is an echogenic stone measuring 1.2 cm in diameter. There is no gallbladder wall thickening, pericholecystic fluid, or positive sonographic Murphy's sign. Common bile duct: Diameter: 4.9 mm Liver: The hepatic echotexture is normal. There is no focal mass nor ductal dilation. The surface contour of the liver is normal. There is a tiny amount of ascites. IMPRESSION: There is at  least 1 gallstone present. There is no sonographic evidence of acute cholecystitis. Normal appearance of the liver and common bile duct. There is a tiny amount of ascites. Electronically Signed   By: David  Martinique M.D.   On: 07/07/2016 11:22      Medications:     Scheduled Medications: . allopurinol  100 mg Oral Daily  . amiodarone  200 mg Oral BID  . guaiFENesin  600 mg Oral BID  . insulin aspart  0-9 Units Subcutaneous TID WC  . mouth rinse  15 mL Mouth Rinse BID  . methimazole  10 mg Oral TID  . pantoprazole  40 mg Oral Q0600  . polyethylene glycol  17 g Oral Daily  . potassium chloride  40 mEq Oral BID  . sodium chloride flush  10-40 mL Intracatheter Q12H  . sodium chloride flush  3 mL Intravenous Q12H    Infusions: . sodium chloride    . heparin 1,650 Units/hr (07/08/16 0507)  . milrinone 0.375 mcg/kg/min (07/08/16 0506)    PRN Medications: sodium chloride, acetaminophen, albuterol, alum & mag hydroxide-simeth, bisacodyl, guaiFENesin-dextromethorphan, ondansetron (ZOFRAN) IV, sodium chloride flush, sodium chloride flush, traMADol   Assessment/Plan   1. Acute on chronic systolic CHF: Nonischemic cardiomyopathy.  Echo 4/18 with EF 20%, moderately dilated RV, moderate-severe Caleb and TR. Low output HF with creatinine up considerably.   - Now dry. CVP 2-3. Lasix stopped. Co-ox ok  - Begin milrinone wean drop to  0.25 mcg/kg/min.  - Off diuretics due to low CVP. Can resume oral diuretics in next 24-48 hours. Appreciate Renal input - Renal function slowly improving. BUN remains markedly elevated. Nephrology following. No acute HD indication. - No beta blocker with low output. No Ace/Arb/Dig/spiro with AKI.  - Considr  hydral/nitrates as BP tolerates, Avoid hypotension with advanced CKD  - Will need to wean inotrope slowly.  - Not candidate for advanced therapies. Lives alone and strong history of non-compliance. Currently with significant AKI.  2. Hyperthyroidism: New  diagnosis. ? Related to amio.   - Continue prednisone and methimazole as treatment of amiodarone-induced hyperthyroidism. No change.  3. NSVT: NSVT and PVCs on milrinone.  Per EP, no definite benefit at this point to stopping amiodarone and may make hyperthyroidism worse by increasing conversion of T4=>T3.  Therefore, have elected not to use mexiletine and to keep him on amiodarone.  - Continue amio 200 mg BID.  - Hyperthyroidism treated with prednisone and methimazole.  4. Atrial fibrillation:  Paroxysmal.  - Remains in NSR.  - Continue po amio as above.  - Remains on heparin gtt. Will switch back to Eliquis. Discussed with PharmD 5. Thrombocytopenia:  Doubt HIT as this preceded admission, looking back  he has had thrombocytopenia since 3/17. Platelets remain low at 43k today. ? Cirrhosis but CT in 2014 comments on normal liver. Platelets normal in 2016 but 98k in 3/17 - RUQ [place] reviewed personally. No evidence of cirrhosis 6. AKI on CKD stage 3:  - Stable today. BUN remains very high - At high risk for progression with weaning of inotropes. Will follow. Appreciate Renal input 7. Hypokalemia - Improved.   Length of Stay: 11  Glori Bickers, MD  07/08/2016, 7:19 AM  Advanced Heart Failure Team Pager [phone] (M-F; 7a - 4p)  Please contact [facility] for night-coverage after hours (4p -7a ) and weekends on [email or web address]

## 2016-07-08 NOTE — Progress Notes (Signed)
S: Breathing well, no new CO O:BP 109/65 (BP Location: Left Arm)   Pulse 77   Temp 98.2 F (36.8 C) (Oral)   Resp 20   Ht 6' (1.829 m) Comment: per pt previously documented 6' 2: was incorrect  Wt 89.6 kg (197 lb 8 oz)   SpO2 90%   BMI 26.79 kg/m   Intake/Output Summary (Last 24 hours) at 07/08/16 0929 Last data filed at 07/08/16 0700  Gross per 24 hour  Intake           609.73 ml  Output             5950 ml  Net         -5340.27 ml   Weight change: -2.223 kg (-4 lb 14.4 oz) FGH:WEXHB and alert CVS: RRR Resp: Basilar crackles Abd:+ BS NT + ascites Ext: Legs wrapped in ACE bandage, less edema NEURO:CNI Ox3 no asterixis   . allopurinol  100 mg Oral Daily  . amiodarone  200 mg Oral BID  . apixaban  5 mg Oral BID  . guaiFENesin  600 mg Oral BID  . insulin aspart  0-9 Units Subcutaneous TID WC  . mouth rinse  15 mL Mouth Rinse BID  . methimazole  10 mg Oral TID  . pantoprazole  40 mg Oral Q0600  . polyethylene glycol  17 g Oral Daily  . sodium chloride flush  10-40 mL Intracatheter Q12H  . sodium chloride flush  3 mL Intravenous Q12H   US Abdomen Limited Ruq  Result Date: 07/07/2016 CLINICAL DATA:  Elevated liver function studies, thrombocytopenia, history of diabetes. EXAM: US ABDOMEN LIMITED - RIGHT UPPER QUADRANT COMPARISON:  Abdominal and pelvic CT scan of October 08, 2012 FINDINGS: The study is somewhat limited due to the patient's body habitus. Gallbladder: The gallbladder is adequately distended. There is an echogenic stone measuring 1.2 cm in diameter. There is no gallbladder wall thickening, pericholecystic fluid, or positive sonographic Murphy's sign. Common bile duct: Diameter: 4.9 mm Liver: The hepatic echotexture is normal. There is no focal mass nor ductal dilation. The surface contour of the liver is normal. There is a tiny amount of ascites. IMPRESSION: There is at least 1 gallstone present. There is no sonographic evidence of acute cholecystitis. Normal appearance  of the liver and common bile duct. There is a tiny amount of ascites. Electronically Signed   By: David  Martinique M.D.   On: 07/07/2016 11:22   BMET    Component Value Date/Time   NA 135 07/08/2016 0402   K 3.9 07/08/2016 0402   CL 85 (L) 07/08/2016 0402   CO2 39 (H) 07/08/2016 0402   GLUCOSE 109 (H) 07/08/2016 0402   BUN 111 (H) 07/08/2016 0402   CREATININE 2.82 (H) 07/08/2016 0402   CALCIUM 8.6 (L) 07/08/2016 0402   GFRNONAA 22 (L) 07/08/2016 0402   GFRAA 25 (L) 07/08/2016 0402   CBC    Component Value Date/Time   WBC 6.1 07/08/2016 0402   RBC 4.40 07/08/2016 0402   HGB 10.2 (L) 07/08/2016 0402   HCT 33.1 (L) 07/08/2016 0402   PLT 45 (L) 07/08/2016 0402   MCV 75.2 (L) 07/08/2016 0402   MCH 23.2 (L) 07/08/2016 0402   MCHC 30.8 07/08/2016 0402   RDW 19.2 (H) 07/08/2016 0402   LYMPHSABS 0.7 06/29/2016 1037   MONOABS 0.3 06/29/2016 1037   EOSABS 0.1 06/29/2016 1037   BASOSABS 0.0 06/29/2016 1037     Assessment:  1. Acute on CKD 3,  (  Baseline Scr 1.6-1.8), Scr about the same, UO excellent 2. Severe cardiomyopathy 3. Hyperthyroidism 4. Paroxysmal A fib, now NSR 5. Chronic thrombocytopenia 6. Met Alkalosis (presumed as no ABG's), on diamox  Plan: 1. DC lasix for now 2. Daily labs 3. He doesn't seem to have a good grasp on his medical problems.  I don't think he would make a very good HD candidate   [employer] T

## 2016-07-08 NOTE — Clinical Social Work Note (Addendum)
Clinical Social Work Assessment  Patient Details  Name: Caleb Bauer MRN: [id number] Date of Birth: Nov 11, 1950  Date of referral:  07/07/16               Reason for consult:  Facility Placement                Permission sought to share information with:  Facility Art therapist granted to share information::  Yes, Verbal Permission Granted  Name::        Agency::  [facility]  Relationship::     Contact Information:     Housing/Transportation Living arrangements for the past 2 months:  Apartment Source of Information:  Patient Patient Interpreter Needed:  None Criminal Activity/Legal Involvement Pertinent to Current Situation/Hospitalization:  No - Comment as needed Significant Relationships:  Other Family Members Lives with:  Self Do you feel safe going back to the place where you live?  No Need for family participation in patient care:  No (Coment)  Care giving concerns: Pt lives at home alone and does not have sufficient support to return home given current impairment.   Social Worker assessment / plan:  CSW spoke with pt concerning PT recommendation for SNF.  Patient answered questions appropriately but sometimes dicussed irrelevant things- was easily redirectable.  CSW explained SNF and SNF referral process.  Patient expressed understanding of recommendation.  Employment status:  Retired Forensic scientist:  [employer] PT Recommendations:  [place] / Referral to community resources:  [place]  Patient/Family's Response to care:  Patient agreeable to SNF placement when medically cleared to leave the hospital.  Patient/Family's Understanding of and Emotional Response to Diagnosis, Current Treatment, and Prognosis:  Patient seemed to have good understanding of current condition and had no questions or concerns at this time.  Emotional Assessment Appearance:  Appears stated age Attitude/Demeanor/Rapport:    Affect  (typically observed):  Appropriate, Accepting Orientation:  Oriented to Self, Oriented to Place, Oriented to  Time, Oriented to Situation Alcohol / Substance use:  Not Applicable Psych involvement (Current and /or in the community):  No (Comment)  Discharge Needs  Concerns to be addressed:  Care Coordination Readmission within the last 30 days:  No Current discharge risk:  Physical Impairment Barriers to Discharge:  Continued Medical Work up   [employer], LCSW 07/08/2016, 9:00 AM

## 2016-07-08 NOTE — Progress Notes (Signed)
ANTICOAGULATION CONSULT NOTE - Follow Up Consult  Pharmacy Consult for Heparin (Apixaban on hold) Indication: atrial fibrillation  Allergies  Allergen Reactions  . Other Nausea And Vomiting    Patient stated drug is "kerein"? Formulation, dose, indication?    Patient Measurements: Height: 6' (182.9 cm) (per pt previously documented 6' 2: was incorrect) Weight: 197 lb 8 oz (89.6 kg) IBW/kg (Calculated) : 77.6  Vital Signs: Temp: 96.5 F (35.8 C) (05/08 0400) Temp Source: Axillary (05/08 0400) BP: 111/75 (05/08 0400) Pulse Rate: 74 (05/08 0400)  Labs:  Recent Labs  07/05/16 0900  07/06/16 0500 07/06/16 1500 07/07/16 0153 07/08/16 0402  HGB  --   < > 9.8*  --  10.1* 10.2*  HCT  --   --  30.5*  --  31.6* 33.1*  PLT  --   --  43*  --  43* 45*  APTT 61*  --  123*  --  117*  --   HEPARINUNFRC 0.24*  < > 0.36  --  0.31 0.29*  CREATININE  --   --  2.97* 2.90* 2.77*  --   < > = values in this interval not displayed.  Estimated Creatinine Clearance: 29.2 mL/min (A) (by C-G formula based on SCr of 2.77 mg/dL (H)).   Medications:  Heparin @ 1450 units/hr  Assessment: 65yom continues on heparin for afib while apixaban on hold in setting of AKI. AKI improving. Heparin level down to slightly subtherapeutic (0.29) on gtt at 1500 units/hr. No issues with line or bleeding reported per RN. CBC stable.   Goal of Therapy:  Heparin level 0.3-0.7 units/ml Monitor platelets by anticoagulation protocol: Yes   Plan:  Increase heparin to 1650 units/hr Will f/u 8hr heparin level  Sherlon Handing, PharmD, BCPS Clinical pharmacist, pager [phone] 07/08/2016,4:57 AM

## 2016-07-09 DIAGNOSIS — J9601 Acute respiratory failure with hypoxia: Secondary | ICD-10-CM

## 2016-07-09 LAB — GLUCOSE, CAPILLARY
GLUCOSE-CAPILLARY: 108 mg/dL — AB (ref 65–99)
GLUCOSE-CAPILLARY: 100 mg/dL — AB (ref 65–99)
GLUCOSE-CAPILLARY: 87 mg/dL (ref 65–99)
GLUCOSE-CAPILLARY: 86 mg/dL (ref 65–99)
Glucose-Capillary: 89 mg/dL (ref 65–99)

## 2016-07-09 LAB — COOXEMETRY PANEL
CARBOXYHEMOGLOBIN: 2.4 % — AB (ref 0.5–1.5)
CARBOXYHEMOGLOBIN: 2.3 % — AB (ref 0.5–1.5)
METHEMOGLOBIN: 1.1 % (ref 0.0–1.5)
Methemoglobin: 1 % (ref 0.0–1.5)
O2 SAT: 56.7 %
O2 Saturation: 53 %
TOTAL HEMOGLOBIN: 11.3 g/dL — AB (ref 12.0–16.0)
Total hemoglobin: 12.4 g/dL (ref 12.0–16.0)

## 2016-07-09 LAB — RENAL FUNCTION PANEL
Albumin: 2.2 g/dL — ABNORMAL LOW (ref 3.5–5.0)
Anion gap: 13 (ref 5–15)
BUN: 102 mg/dL — AB (ref 6–20)
CALCIUM: 8.8 mg/dL — AB (ref 8.9–10.3)
CHLORIDE: 88 mmol/L — AB (ref 101–111)
CO2: 36 mmol/L — AB (ref 22–32)
CREATININE: 2.6 mg/dL — AB (ref 0.61–1.24)
GFR calc non Af Amer: 24 mL/min — ABNORMAL LOW (ref >60)
GFR, EST AFRICAN AMERICAN: 28 mL/min — AB (ref >60)
GLUCOSE: 102 mg/dL — AB (ref 65–99)
Phosphorus: 3.7 mg/dL (ref 2.5–4.6)
Potassium: 3.9 mmol/L (ref 3.5–5.1)
SODIUM: 137 mmol/L (ref 135–145)

## 2016-07-09 LAB — MAGNESIUM: Magnesium: 1.8 mg/dL (ref 1.7–2.4)

## 2016-07-09 LAB — CBC
HEMATOCRIT: 36.2 % — AB (ref 39.0–52.0)
HEMOGLOBIN: 11.5 g/dL — AB (ref 13.0–17.0)
MCH: 24.2 pg — ABNORMAL LOW (ref 26.0–34.0)
MCHC: 31.8 g/dL (ref 30.0–36.0)
MCV: 76.2 fL — ABNORMAL LOW (ref 78.0–100.0)
Platelets: 52 10*3/uL — ABNORMAL LOW (ref 150–400)
RBC: 4.75 MIL/uL (ref 4.22–5.81)
RDW: 19.8 % — ABNORMAL HIGH (ref 11.5–15.5)
WBC: 6.6 10*3/uL (ref 4.0–10.5)

## 2016-07-09 MED ORDER — MILRINONE LACTATE IN DEXTROSE 20-5 MG/100ML-% IV SOLN
0.1250 ug/kg/min | INTRAVENOUS | Status: DC
  Administered 2016-07-09 – 2016-07-10 (×2): 0.125 ug/kg/min via INTRAVENOUS
  Filled 2016-07-09: qty 100
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
## 2016-07-09 NOTE — Progress Notes (Signed)
S: No new CO.  Doesn't answer questions more than yes/no O:BP 103/72 (BP Location: Left Arm)   Pulse 86   Temp 97.5 F (36.4 C) (Oral)   Resp (!) 22   Ht 6' (1.829 m) Comment: per pt previously documented 6' 2: was incorrect  Wt 88.9 kg (196 lb)   SpO2 95%   BMI 26.58 kg/m   Intake/Output Summary (Last 24 hours) at 07/09/16 1000 Last data filed at 07/09/16 0600  Gross per 24 hour  Intake           160.15 ml  Output             1500 ml  Net         -1339.85 ml   Weight change: -0.68 kg (-1 lb 8 oz) SFS:ELTRV and alert CVS: RRR Resp: Crackles improved Abd:+ BS NT + ascites Ext: Legs wrapped in ACE bandage, less edema NEURO:CNI Ox3 no asterixis   . allopurinol  100 mg Oral Daily  . amiodarone  200 mg Oral BID  . apixaban  5 mg Oral BID  . guaiFENesin  600 mg Oral BID  . insulin aspart  0-9 Units Subcutaneous TID WC  . methimazole  10 mg Oral TID  . pantoprazole  40 mg Oral Q0600  . polyethylene glycol  17 g Oral Daily  . sodium chloride flush  10-40 mL Intracatheter Q12H  . sodium chloride flush  3 mL Intravenous Q12H   US Abdomen Limited Ruq  Result Date: 07/07/2016 CLINICAL DATA:  Elevated liver function studies, thrombocytopenia, history of diabetes. EXAM: US ABDOMEN LIMITED - RIGHT UPPER QUADRANT COMPARISON:  Abdominal and pelvic CT scan of October 08, 2012 FINDINGS: The study is somewhat limited due to the patient's body habitus. Gallbladder: The gallbladder is adequately distended. There is an echogenic stone measuring 1.2 cm in diameter. There is no gallbladder wall thickening, pericholecystic fluid, or positive sonographic Murphy's sign. Common bile duct: Diameter: 4.9 mm Liver: The hepatic echotexture is normal. There is no focal mass nor ductal dilation. The surface contour of the liver is normal. There is a tiny amount of ascites. IMPRESSION: There is at least 1 gallstone present. There is no sonographic evidence of acute cholecystitis. Normal appearance of the liver  and common bile duct. There is a tiny amount of ascites. Electronically Signed   By: David  Martinique M.D.   On: 07/07/2016 11:22   BMET    Component Value Date/Time   NA 137 07/09/2016 0412   K 3.9 07/09/2016 0412   CL 88 (L) 07/09/2016 0412   CO2 36 (H) 07/09/2016 0412   GLUCOSE 102 (H) 07/09/2016 0412   BUN 102 (H) 07/09/2016 0412   CREATININE 2.60 (H) 07/09/2016 0412   CALCIUM 8.8 (L) 07/09/2016 0412   GFRNONAA 24 (L) 07/09/2016 0412   GFRAA 28 (L) 07/09/2016 0412   CBC    Component Value Date/Time   WBC 6.6 07/09/2016 0412   RBC 4.75 07/09/2016 0412   HGB 11.5 (L) 07/09/2016 0412   HCT 36.2 (L) 07/09/2016 0412   PLT 52 (L) 07/09/2016 0412   MCV 76.2 (L) 07/09/2016 0412   MCH 24.2 (L) 07/09/2016 0412   MCHC 31.8 07/09/2016 0412   RDW 19.8 (H) 07/09/2016 0412   LYMPHSABS 0.7 06/29/2016 1037   MONOABS 0.3 06/29/2016 1037   EOSABS 0.1 06/29/2016 1037   BASOSABS 0.0 06/29/2016 1037     Assessment:  1. Acute on CKD 3,  (Baseline Scr 1.6-1.8),  UO  good though incomplete, Scr trending down 2. Severe cardiomyopathy 3. Hyperthyroidism 4. Paroxysmal A fib, now NSR 5. Chronic thrombocytopenia 6. Met Alkalosis (presumed as no ABG's), on diamox  Plan: 1. Agree with Dr McLean that palliative therapy is best 2. Palliative care consult 3. Spoke with Dr Rai.  He is not a dialysis candidate and biggest problem is his cardiomyopathy.  His prognosis is poor and comfort care should be the priority.  Will sign off  MATTINGLY,MICHAEL T 

## 2016-07-09 NOTE — Progress Notes (Signed)
Received pt in state of incontinence, bowel and bladder. Pt refused to stand to allow for bathing. Drowsy and reacts in pain to light touch. Niece called and will visit. Pt took medication but refused food. Will continue to support needs.

## 2016-07-09 NOTE — Progress Notes (Signed)
Patient ID: Caleb Bauer, male   DOB: [birth year], 66 y.o.   MRN: [id number]     Advanced Heart Failure Rounding Note   Subjective:    [name] is a 66 year old with a history of DMII, HTN, HF, COPD, former ETOH abuse (stopped drinking 2014), noncompliance, and systolic heart failure admitted with volume overload and AKI in the setting on medication noncompliance.    Fatigued. Denies SOB, lightheadedness, or dizziness. Refused PT yesterday. Not really ambulating.  Creatinine 2.8 -> 2.60 with holding lasix. Remains on milrinone 0.25 mcg/kg/min. Coox 56% this am. CVP remains ~3.  Objective:   Weight Range: 196 lb (88.9 kg) Body mass index is 26.58 kg/m.   Vital Signs:   Temp:  [97.5 F (36.4 C)-98.3 F (36.8 C)] 97.5 F (36.4 C) (05/09 0400) Pulse Rate:  [79-87] 86 (05/09 0400) Resp:  [16-27] 22 (05/09 0400) BP: (95-112)/(63-75) 103/72 (05/09 0400) SpO2:  [90 %-95 %] 95 % (05/09 0400) Weight:  [196 lb (88.9 kg)] 196 lb (88.9 kg) (05/09 0500) Last BM Date: 07/06/16  Weight change: Filed Weights   07/07/16 0436 07/08/16 0400 07/09/16 0500  Weight: 202 lb 6.4 oz (91.8 kg) 197 lb 8 oz (89.6 kg) 196 lb (88.9 kg)    Intake/Output:   Intake/Output Summary (Last 24 hours) at 07/09/16 0819 Last data filed at 07/09/16 0600  Gross per 24 hour  Intake           160.15 ml  Output             1500 ml  Net         -1339.85 ml     Physical Exam: CVP 3 General:Lying in bed, NAD.  HEENT: Normal Neck: supple. JVP flat. Carotids 2+ bilat; no bruits. No thyromegaly or nodule noted. Cor: PMI lateral. RRR. +S3.. RRR, No M/G/R noted Lungs: CTAB, normal effort. Abdomen: soft, non-tender, distended, no HSM. No bruits or masses. +BS  Extremities: no cyanosis, clubbing, or rash. UNNA boots race edema.  Neuro: alert & orientedx3, cranial nerves grossly intact. moves all 4 extremities w/o difficulty. Affect pleasant   Telemetry: Personally reviewed, NSR 70-80s   Labs: CBC  Recent  Labs  07/08/16 0402 07/09/16 0412  WBC 6.1 6.6  HGB 10.2* 11.5*  HCT 33.1* 36.2*  MCV 75.2* 76.2*  PLT 45* 52*   Basic Metabolic Panel  Recent Labs  07/08/16 0402 07/09/16 0412  NA 135 137  K 3.9 3.9  CL 85* 88*  CO2 39* 36*  GLUCOSE 109* 102*  BUN 111* 102*  CREATININE 2.82* 2.60*  CALCIUM 8.6* 8.8*  MG 1.8 1.8  PHOS 3.2 3.7   Liver Function Tests  Recent Labs  07/08/16 0402 07/09/16 0412  ALBUMIN 2.1* 2.2*   No results for input(s): LIPASE, AMYLASE in the last 72 hours. Cardiac Enzymes No results for input(s): CKTOTAL, CKMB, CKMBINDEX, TROPONINI in the last 72 hours.  BNP: BNP (last 3 results)  Recent Labs  06/27/16 0349  BNP >4,500.0*    ProBNP (last 3 results) No results for input(s): PROBNP in the last 8760 hours.   D-Dimer No results for input(s): DDIMER in the last 72 hours. Hemoglobin A1C No results for input(s): HGBA1C in the last 72 hours. Fasting Lipid Panel No results for input(s): CHOL, HDL, LDLCALC, TRIG, CHOLHDL, LDLDIRECT in the last 72 hours. Thyroid Function Tests No results for input(s): TSH, T4TOTAL, T3FREE, THYROIDAB in the last 72 hours.  Invalid input(s): FREET3  Other results:  Imaging/Studies:  No results found.    Medications:     Scheduled Medications: . allopurinol  100 mg Oral Daily  . amiodarone  200 mg Oral BID  . apixaban  5 mg Oral BID  . guaiFENesin  600 mg Oral BID  . insulin aspart  0-9 Units Subcutaneous TID WC  . methimazole  10 mg Oral TID  . pantoprazole  40 mg Oral Q0600  . polyethylene glycol  17 g Oral Daily  . sodium chloride flush  10-40 mL Intracatheter Q12H  . sodium chloride flush  3 mL Intravenous Q12H    Infusions: . sodium chloride    . milrinone 0.25 mcg/kg/min (07/09/16 0300)    PRN Medications: sodium chloride, acetaminophen, albuterol, alum & mag hydroxide-simeth, bisacodyl, guaiFENesin-dextromethorphan, ondansetron (ZOFRAN) IV, sodium chloride flush, sodium  chloride flush, traMADol   Assessment/Plan   1. Acute on chronic systolic CHF: Nonischemic cardiomyopathy.  Echo 4/18 with EF 20%, moderately dilated RV, moderate-severe MR and TR. Low output HF with creatinine up considerably.   - Dry. CVP 3. Coox 56.7% this am milrinone 0.25 mcg/kg/min. Will try to wean to 0.125 mcg/kg/min and recheck coox at 12000 - Would continued to hold diuretics at least today. Possibly low dose po tomorrow.  - Renal function slowly improving. BUN remains markedly elevated. Nephrology following. No acute HD indication. No change. - No beta blocker with low output. No Ace/Arb/Dig/spiro with AKI.  - Consider hydral/nitrates as BP tolerates, Avoid hypotension with advanced CKD. No room today.  - Not candidate for advanced therapies. Lives alone and strong history of non-compliance. Currently with significant AKI. He MAY be candidate for palliative milrinone if he were to go to SNF.  2. Hyperthyroidism: New diagnosis. ? Related to amio.   - Continue prednisone and methimazole as treatment of amiodarone-induced hyperthyroidism. No change. 3. NSVT: NSVT and PVCs on milrinone.  Per EP, no definite benefit at this point to stopping amiodarone and may make hyperthyroidism worse by increasing conversion of T4=>T3.  Therefore, have elected not to use mexiletine and to keep him on amiodarone.  - Continue amio 200 mg BID.  - Hyperthyroidism treated with methimazole, now off prednisone. No change.  4. Atrial fibrillation:  Paroxysmal.  - Remains in NSR on po amio.   - Now back on eliquis.  5. Thrombocytopenia:  Doubt HIT as this preceded admission, looking back he has had thrombocytopenia since 3/17. Platelets remain low at 43k today. ? Cirrhosis but CT in 2014 comments on normal liver. Platelets normal in 2016 but 98k in 3/17 - RUQ [place] reviewed personally with no evidence of cirrhosis.  - Platelets remain low but slightly improved from 45 -> 52 6. AKI on CKD stage 3:  - Slowly  improving. Lasix on hold. BUN remains > 100 but slowly improving.  - At high risk for progression with weaning of inotropes. Continue to follow closely. Appreciate Renal input 7. Hypokalemia - Stable.   Length of Stay: [address]  Caleb Bauer  07/09/2016, 8:19 AM  Advanced Heart Failure Team Pager [phone] (M-F; 7a - 4p)  Please contact [facility] for night-coverage after hours (4p -7a ) and weekends on [email or web address]  Patient seen with PA, agree with the above note.    Holding diuretic for now, CVP 3 today and weight down about 30 lbs.    BUN/creatinine mildly lower today.    BP soft but acceptable, probably no room to add hydralazine/nitrates today.   PT recommend SNF.  He remains  on milrinone 0.25 with co-ox 56%.  He is a poor candidate for advanced therapies with renal failure, h/o noncompliance, and very poor insight into his condition.  Will try lowering milrinone to 0.125, but suspect he will not tolerate wean as co-ox is already marginal on 0.25.  Repeat co-ox this afternoon, increase milrinone back to 0.25 if co-ox drops farther.  At this point, I think palliative home IV milrinone may be needed (especially to keep him out of the hospital).  He will need SNF for rehab after discharge, may end up going on milrinone but would have palliative care see him prior to that.   Loralie Champagne 07/09/2016 8:43 AM

## 2016-07-09 NOTE — Progress Notes (Signed)
Palliative Medicine Team consult was received.  Attempted to see Caleb Bauer this evening.  He reports being tired and did not want to meet.  He is agreeable for me to come back for f/u tomorrow AM.   If there are urgent needs or questions please call [phone]. Thank you for consulting out team to assist with this patients care.  NO CHARGE NOTE  Micheline Rough, MD [place] Team [phone]

## 2016-07-09 NOTE — Progress Notes (Signed)
Physical Therapy Treatment Patient Details Name: Caleb Bauer MRN: [id number] DOB: 10/01/1950 Today's Date: 07/09/2016    History of Present Illness Patient is a 66 yo male admitted 06/27/16 with SOB and LE edema/cellulitis.  Patient with CHF, acute on CKD, and LE wounds/blisters.     PMH:  BLE cellulitis to blisters and skin loss, Lt posterior leg wound, COPD, DM, HTN, CHF, NICM, EF 20%, obesity, CKD    PT Comments    Patient continues to demonstrate lethargy limiting participation in mobility. Tolerated taking a few steps to get to chair with Min guard assist for safety. Requires max encouragement and coaxing to participate and to stay attended to task. Pt easily drowsing off during session. Distracted. HR variable however not sure of accuracy- ranged from 90s-180s.  Will continue to follow. Pt self limiting.  Follow Up Recommendations  Supervision for mobility/OOB;SNF     Equipment Recommendations  None recommended by PT    Recommendations for Other Services       Precautions / Restrictions Precautions Precautions: Fall Precaution Comments: watch HR Restrictions Weight Bearing Restrictions: No Other Position/Activity Restrictions: Keep LE's elevated for edema reduction    Mobility  Bed Mobility Overal bed mobility: Needs Assistance Bed Mobility: Supine to Sit     Supine to sit: Mod assist     General bed mobility comments: Increased time and cues to get to EOB due to lethargy. Difficulty getting LEs to EOB.  Transfers Overall transfer level: Needs assistance Equipment used: Rolling walker (2 wheeled) Transfers: Sit to/from Stand Sit to Stand: Min assist         General transfer comment: Assist to rise from EOB; sat EOB for 10 mins comtemplating standing requiring cues to stay attended to task.  Ambulation/Gait Ambulation/Gait assistance: Min guard Ambulation Distance (Feet): 7 Feet Assistive device: Rolling walker (2 wheeled) Gait Pattern/deviations:  Step-through pattern;Trunk flexed;Decreased stride length Gait velocity: decreased   General Gait Details: Tolerated taking a few steps to chair with Min guard assist for support and line management. Sp02 jumping al over the place as well as HR fluctuating 90s-180s bpm. Not sure of accuracy.   Stairs            Wheelchair Mobility    Modified Rankin (Stroke Patients Only)       Balance Overall balance assessment: Needs assistance Sitting-balance support: Feet supported;No upper extremity supported Sitting balance-Leahy Scale: Good     Standing balance support: During functional activity;Bilateral upper extremity supported Standing balance-Leahy Scale: Poor Standing balance comment: requires BUE support in standing today due to lethargy                            Cognition Arousal/Alertness: Lethargic Behavior During Therapy: Flat affect Overall Cognitive Status: No family/caregiver present to determine baseline cognitive functioning Area of Impairment: Following commands;Awareness;Problem solving;Safety/judgement                       Following Commands: Follows one step commands inconsistently   Awareness: Intellectual Problem Solving: Slow processing;Decreased initiation;Requires verbal cues;Requires tactile cues General Comments: Requires max encouragement to participate; increased time to perform all tasks with coaxing and constant cueing/stimulus.      Exercises      General Comments General comments (skin integrity, edema, etc.): BLEs wrapped      Pertinent Vitals/Pain Pain Assessment: Faces Faces Pain Scale: Hurts a little bit Pain Location: BLEs with movement Pain Descriptors /  Indicators: Sore;Grimacing Pain Intervention(s): Monitored during session;Repositioned    Home Living                      Prior Function            PT Goals (current goals can now be found in the care plan section) Progress towards PT  goals: Not progressing toward goals - comment (secondary to lethargy, unwillingness )    Frequency    Min 3X/week      PT Plan Current plan remains appropriate    Co-evaluation              AM-PAC PT "6 Clicks" Daily Activity  Outcome Measure  Difficulty turning over in bed (including adjusting bedclothes, sheets and blankets)?: None Difficulty moving from lying on back to sitting on the side of the bed? : Total Difficulty sitting down on and standing up from a chair with arms (e.g., wheelchair, bedside commode, etc,.)?: Total Help needed moving to and from a bed to chair (including a wheelchair)?: A Little Help needed walking in hospital room?: A Little Help needed climbing 3-5 steps with a railing? : A Lot 6 Click Score: 14    End of Session   Activity Tolerance: Patient limited by lethargy Patient left: in chair;with call bell/phone within reach Nurse Communication: Mobility status PT Visit Diagnosis: Unsteadiness on feet (R26.81);Other abnormalities of gait and mobility (R26.89);Muscle weakness (generalized) (M62.81) Pain - Right/Left:  (both) Pain - part of body: Leg     Time: 7654-6503 PT Time Calculation (min) (ACUTE ONLY): 32 min  Charges:  $Therapeutic Activity: 23-37 mins                    G Codes:       Wray Kearns, PT, DPT [phone]     Marguarite Arbour A [place] 07/09/2016, 2:44 PM

## 2016-07-10 LAB — GLUCOSE, CAPILLARY
GLUCOSE-CAPILLARY: 72 mg/dL (ref 65–99)
Glucose-Capillary: 79 mg/dL (ref 65–99)
Glucose-Capillary: 79 mg/dL (ref 65–99)
Glucose-Capillary: 77 mg/dL (ref 65–99)

## 2016-07-10 LAB — COOXEMETRY PANEL
CARBOXYHEMOGLOBIN: 2.6 % — AB (ref 0.5–1.5)
METHEMOGLOBIN: 1 % (ref 0.0–1.5)
O2 SAT: 53.3 %
Total hemoglobin: 11.7 g/dL — ABNORMAL LOW (ref 12.0–16.0)

## 2016-07-10 LAB — CBC
HEMATOCRIT: 36.1 % — AB (ref 39.0–52.0)
Hemoglobin: 11.3 g/dL — ABNORMAL LOW (ref 13.0–17.0)
MCH: 23.7 pg — ABNORMAL LOW (ref 26.0–34.0)
MCHC: 31.3 g/dL (ref 30.0–36.0)
MCV: 75.7 fL — AB (ref 78.0–100.0)
Platelets: 60 10*3/uL — ABNORMAL LOW (ref 150–400)
RBC: 4.77 MIL/uL (ref 4.22–5.81)
RDW: 19.8 % — AB (ref 11.5–15.5)
WBC: 7.4 10*3/uL (ref 4.0–10.5)

## 2016-07-10 LAB — RENAL FUNCTION PANEL
ALBUMIN: 2 g/dL — AB (ref 3.5–5.0)
ANION GAP: 13 (ref 5–15)
BUN: 94 mg/dL — ABNORMAL HIGH (ref 6–20)
CHLORIDE: 88 mmol/L — AB (ref 101–111)
CO2: 34 mmol/L — ABNORMAL HIGH (ref 22–32)
Calcium: 8.7 mg/dL — ABNORMAL LOW (ref 8.9–10.3)
Creatinine, Ser: 2.56 mg/dL — ABNORMAL HIGH (ref 0.61–1.24)
GFR calc Af Amer: 29 mL/min — ABNORMAL LOW (ref >60)
GFR, EST NON AFRICAN AMERICAN: 25 mL/min — AB (ref >60)
Glucose, Bld: 86 mg/dL (ref 65–99)
PHOSPHORUS: 4.6 mg/dL (ref 2.5–4.6)
POTASSIUM: 3.5 mmol/L (ref 3.5–5.1)
Sodium: 135 mmol/L (ref 135–145)

## 2016-07-10 LAB — MAGNESIUM: MAGNESIUM: 1.6 mg/dL — AB (ref 1.7–2.4)

## 2016-07-10 MED ORDER — APIXABAN 5 MG PO TABS
5.0000 mg | ORAL_TABLET | Freq: Two times a day (BID) | ORAL | Status: DC
  Administered 2016-07-10 – 2016-07-14 (×9): 5 mg via ORAL
  Filled 2016-07-10 (×9): qty 1

## 2016-07-10 MED ORDER — MAGNESIUM SULFATE 4 GM/100ML IV SOLN
4.0000 g | Freq: Once | INTRAVENOUS | Status: AC
  Administered 2016-07-10: 4 g via INTRAVENOUS
  Filled 2016-07-10: qty 100

## 2016-07-10 MED ORDER — AMIODARONE HCL 200 MG PO TABS
200.0000 mg | ORAL_TABLET | Freq: Every day | ORAL | Status: DC
  Administered 2016-07-11 – 2016-07-14 (×4): 200 mg via ORAL
  Filled 2016-07-10 (×4): qty 1

## 2016-07-10 MED ORDER — SODIUM CHLORIDE 0.9 % IV BOLUS (SEPSIS)
250.0000 mL | Freq: Once | INTRAVENOUS | Status: AC
  Administered 2016-07-10: 250 mL via INTRAVENOUS

## 2016-07-10 NOTE — Progress Notes (Signed)
Patient ID: Caleb Bauer, male   DOB: 1950/03/27, 66 y.o.   MRN: [id number]     Advanced Heart Failure Rounding Note   Subjective:    Caleb Bauer is a 66 year old with a history of DMII, HTN, HF, COPD, former ETOH abuse (stopped drinking 2014), noncompliance, and systolic heart failure admitted with volume overload and AKI in the setting on medication noncompliance.    Yesterday milrinone cut back to 125 mcg/kg/min, co-ox 53.3%. Palliative Care consulted. Renal signed off as he is not a candidate for HD. Platelets 60 K today.   Today he is complaining of dizziness. Denies SOB. Denies CP.   Objective:   Weight Range: 187 lb 3.2 oz (84.9 kg) Body mass index is 25.39 kg/m.   Vital Signs:   Temp:  [97.9 F (36.6 C)-98.6 F (37 C)] 98.3 F (36.8 C) (05/10 0326) Pulse Rate:  [87-99] 93 (05/10 0700) Resp:  [16-29] 21 (05/10 0700) BP: (110-117)/(70-80) 116/80 (05/10 0400) SpO2:  [87 %-100 %] 93 % (05/10 0700) Weight:  [187 lb 3.2 oz (84.9 kg)] 187 lb 3.2 oz (84.9 kg) (05/10 0326) Last BM Date: 07/09/16  Weight change: Filed Weights   07/08/16 0400 07/09/16 0500 07/10/16 0326  Weight: 197 lb 8 oz (89.6 kg) 196 lb (88.9 kg) 187 lb 3.2 oz (84.9 kg)    Intake/Output:   Intake/Output Summary (Last 24 hours) at 07/10/16 0739 Last data filed at 07/10/16 0700  Gross per 24 hour  Intake           691.79 ml  Output                0 ml  Net           691.79 ml     Physical Exam: CVP 1 General:  NAD. In bed.  HEENT: normal Neck: supple. no JVD. Carotids 2+ bilat; no bruits. No lymphadenopathy or thryomegaly appreciated. Cor: PMI nondisplaced. Regular rate & rhythm. No rubs, gallops or murmurs. Lungs: clear Abdomen: soft, nontender, nondistended. No hepatosplenomegaly. No bruits or masses. Good bowel sounds. Extremities: no cyanosis, clubbing, rash, edema. RUE PICC. R and LLE compression wraps.  Neuro: arousable. Drowsy. alert & orientedx3, cranial nerves grossly intact. moves all 4  extremities w/o difficulty. Affect flat   Telemetry: NSR 90s personally reviewed.  Labs: CBC  Recent Labs  07/09/16 0412 07/10/16 0505  WBC 6.6 7.4  HGB 11.5* 11.3*  HCT 36.2* 36.1*  MCV 76.2* 75.7*  PLT 52* PENDING   Basic Metabolic Panel  Recent Labs  07/09/16 0412 07/10/16 0505  NA 137 135  K 3.9 3.5  CL 88* 88*  CO2 36* 34*  GLUCOSE 102* 86  BUN 102* 94*  CREATININE 2.60* 2.56*  CALCIUM 8.8* 8.7*  MG 1.8 1.6*  PHOS 3.7 4.6   Liver Function Tests  Recent Labs  07/09/16 0412 07/10/16 0505  ALBUMIN 2.2* 2.0*   No results for input(s): LIPASE, AMYLASE in the last 72 hours. Cardiac Enzymes No results for input(s): CKTOTAL, CKMB, CKMBINDEX, TROPONINI in the last 72 hours.  BNP: BNP (last 3 results)  Recent Labs  06/27/16 0349  BNP >4,500.0*    ProBNP (last 3 results) No results for input(s): PROBNP in the last 8760 hours.   D-Dimer No results for input(s): DDIMER in the last 72 hours. Hemoglobin A1C No results for input(s): HGBA1C in the last 72 hours. Fasting Lipid Panel No results for input(s): CHOL, HDL, LDLCALC, TRIG, CHOLHDL, LDLDIRECT in the last  72 hours. Thyroid Function Tests No results for input(s): TSH, T4TOTAL, T3FREE, THYROIDAB in the last 72 hours.  Invalid input(s): FREET3  Other results:     Imaging/Studies:  No results found.    Medications:     Scheduled Medications: . allopurinol  100 mg Oral Daily  . amiodarone  200 mg Oral BID  . apixaban  5 mg Oral BID  . guaiFENesin  600 mg Oral BID  . insulin aspart  0-9 Units Subcutaneous TID WC  . methimazole  10 mg Oral TID  . pantoprazole  40 mg Oral Q0600  . polyethylene glycol  17 g Oral Daily  . sodium chloride flush  10-40 mL Intracatheter Q12H  . sodium chloride flush  3 mL Intravenous Q12H    Infusions: . sodium chloride    . milrinone 0.125 mcg/kg/min (07/10/16 0517)    PRN Medications: sodium chloride, acetaminophen, albuterol, alum & mag  hydroxide-simeth, bisacodyl, guaiFENesin-dextromethorphan, ondansetron (ZOFRAN) IV, sodium chloride flush, sodium chloride flush, traMADol   Assessment/Plan   1. Acute on chronic systolic CHF: Nonischemic cardiomyopathy.  Echo 4/18 with EF 20%, moderately dilated RV, moderate-severe Caleb and TR. Low output HF with creatinine up considerably.   CO-OX 53%.  Stop milrinone. CVP 1. Dizzy will give a little fluid back. Give 250 cc NS x1.  No diuretics. Overall diuresed 40 pounds.  - No beta blocker with low output. No Ace/Arb/Dig/spiro with AKI.  - Hold off on hydralazine/nitrates with CKD. Need to avoid hypotension.   - Not candidate for advanced therapies due to social constraints.  2. Hyperthyroidism: New diagnosis. ? Related to amio.   - Continue methimazole as treatment of amiodarone-induced hyperthyroidism (now off prednisone). No change. 3. NSVT: NSVT and PVCs on milrinone.  Per EP, no definite benefit at this point to stopping amiodarone and may make hyperthyroidism worse by increasing conversion of T4=>T3.  Therefore, have elected not to use mexiletine and to keep him on amiodarone.  - Stopping milrinone, can decrease amiodarone to once daily.  - Hyperthyroidism treated with methimazole, now off prednisone. No change.  4. Atrial fibrillation:  Paroxysmal.  - Maintaining NSR. Continue po amio.    - Platelets stable at 60K.  5. Thrombocytopenia:  Doubt HIT as this preceded admission, looking back he has had thrombocytopenia since 3/17. ? Cirrhosis but CT in 2014 comments on normal liver. Platelets normal in 2016 but 98k in 3/17 - RUQ [place] --> no evidence of cirrhosis.  - Plts stable at 60K, can continue Eliquis.   6. AKI on CKD stage 3:  Lasix on hold. BUN 94. Creatinine 2.56  Not a candidate for HD.  Renal signed off with recommendations for Palliative Care.    Disposition: Per PT SNF. May benefit from Hospice. Dr Domingo Cocking will follow up today.   Length of Stay: [place], NP    07/10/2016, 7:39 AM  Advanced Heart Failure Team Pager [phone] (M-F; 7a - 4p)  Please contact [facility] for night-coverage after hours (4p -7a ) and weekends on [email or web address]  Patient seen with NP, agree with the above note.  Poor insight into his problems, not very communicative today.  Turned away palliative care, asked them to come back tomorrow.   Co-ox 53.3%, will try stopping milrinone today.  Would prefer not to send him out on milrinone.  I think that comfort/palliative care and preferably hospice would be more appropriate.  Needs discussion with palliative care, hopefully tomorrow.   Plts 60K, continue Eliquis.  CVP low, creatinine fairly stable.  Hold diuretics.  Gentle IV fluid as per NP note above.   Loralie Champagne 07/10/2016 11:14 AM

## 2016-07-10 NOTE — Progress Notes (Signed)
Pt. Refused cpap for tonight. 
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
## 2016-07-10 NOTE — Consult Note (Signed)
Consultation Note Date: 07/10/2016   Patient Name: Caleb Bauer  DOB: Apr 02, 1950  MRN: [id number]  Age / Sex: 66 y.o., male  PCP: [name], MD Referring Physician: Mendel Corning, MD  Reason for Consultation: Establishing goals of care  HPI/Patient Profile: 66 y.o. 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  Palliative consulted for goals of care.   Clinical Assessment and Goals of Care: I met today with Mr. Caleb Bauer.   We discussed difference between a aggressive medical intervention path and a palliative, comfort focused care path.  Values and goals of care important to patient and family were attempted to be elicited.  He reports that the most important things to him are his independence and his family (specifically his niece).  He reports that his doctors have been doing a good job explaining things to him, but when asked to relay what he has been told, he replied, "Nah." and quit talking.  He is very resistant to engage in conversation, but will answer questions if asked repeatedly in slightly different fashion.  We discussed clinical course as well as wishes moving forward in regard to advanced directives.  Concepts specific to code status and rehospitalization discussed.  We discussed that in light of multiple chronic medical problems that have worsened with this acute exacerbation, care should be focused on interventions that are likely to allow him to achieve goal of being out of the hospital and spending time with family. I discussed with him regarding heroic interventions at the end-of-life and he states this would not be in line with prior expressed wishes for a natural death or be  likely to lead to getting well enough to go back home. He was in agreement with changing CODE STATUS to DO NOT RESUSCITATE.  No documented HCPOA, but he reports his niece helps with medical decision making.  I called her per his request.  She is in agreement that a good plan would be to plan to attempt a trial of rehab as they have previously been arranging. He has done well with rehabbing in the past and she reports being told in the past that he was getting worse, "but don't count him out cause he will bounce back.".  If he does well at SNF and continues to thrive, I encouraged they continue with this plan. If, however he is unable to regain function and he continues to decline, I recommended that palliative care follow him at SNF to determine if he may be better served by focusing his care on staying out of the hospital with support of organization such as hospice.  Questions and concerns addressed.   PMT will continue to support holistically.  SUMMARY OF RECOMMENDATIONS   - DNR/DNI - He wants to try to wean off milirinone and attempt trial of rehab at [facility].  He  is not interested in discussing hospice at this time.   - I recommend palliative care to follow at SNF to continue to conversations and guide him as his clinical course with trial of rehab becomes more clear.   - Will continue to progress conversation if he is not able to wean from milrinone and as he is emotionally willing and able.   Code Status/Advance Care Planning:  DNR  Psycho-social/Spiritual:   Desire for further Chaplaincy support:no  Additional Recommendations: Caregiving  Support/Resources  Prognosis:   < 6 months  Discharge Planning: To Be Determined      Primary Diagnoses: Present on Admission: . COPD (chronic obstructive pulmonary disease) ([place]) . Chronic gouty arthritis . GERD (gastroesophageal reflux disease) . Hypertensive heart disease . Iron deficiency anemia . Acute renal failure superimposed on  stage 3 chronic kidney disease ([place]) . Sleep apnea . Chest pain . Acute on chronic combined systolic (congestive) and diastolic (congestive) heart failure ([place]) . Atrial flutter by electrocardiogram ([place]) . Bilateral leg edema . Pressure injury of skin . AKI (acute kidney injury) ([place]) . Amiodarone-induced thyroiditis . 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 Obesity (BMI 30.0-34.9) . Hyponatremia . Anasarca . Hypokalemia . Gout . DM (diabetes mellitus), type 2 with renal complications ([place]) . Cardiorenal syndrome with renal failure . Acute respiratory failure ([place]) . Physical deconditioning   I have reviewed the medical record, interviewed the patient and family, and examined the patient. The following aspects are pertinent.  Past Medical History:  Diagnosis Date  . Acute renal failure ([place]) 11/23/2012  . Arthritis   . Atrial flutter by electrocardiogram ([place]) 11/2012  . Cardiomyopathy, dilated ([place])   . CHF (congestive heart failure) ([place])   . Chronic kidney disease   . Colon polyps   . COPD (chronic obstructive pulmonary disease) ([place])   . Diabetes mellitus without complication ([place])   . Encephalopathy acute 03/14/2013  . Enteritis due to Clostridium difficile 03/12/2014  . GERD (gastroesophageal reflux disease)    appt 5/17  . Gout   . Gout flare 09/12/2013  . HCAP (healthcare-associated pneumonia) 03/24/2013   HCAP 03/09/13 >tx w/ abx  cXR 03/23/13 >improved but persistent bibasilar consolidation>cxr >   . Herpes    BUTT AND BACK- 9/23 HEALED  . Herpes zoster 04/18/2013  . Hx of colonic polyps    Remote hx 08/2015 - 5 mm adenoma recall 2022   . Hypertension   . Incarcerated incisional hernia 05/30/2015  . Partial small bowel obstruction s/p lap adhesiolysis 05/30/2015 05/30/2015  . Recurrent ventral hernia with incarceration s/p lap repair with mesh 05/30/2015 05/30/2015  . S/P laparoscopic hernia repair 05/30/2015  . Sepsis due to undetermined organism with  acute respiratory failure ([place]) 03/10/2013  . Sleep apnea    never used cpap but has one  . Small bowel obstruction ([place]) 10/2012  . UTI (urinary tract infection) 09/15/2013   Social History   Social History  . Marital status: Single    Spouse name: N/A  . Number of children: 0  . Years of education: N/A   Occupational History  . retired    Social History Main Topics  . Smoking status: Former Smoker    Packs/day: 1.00    Years: 30.00    Types: Cigarettes    Quit date: 09/28/2012  . Smokeless tobacco: Never Used  . Alcohol use No     Comment: 2015 last alcohol"  . Drug use: No  . Sexual activity: No  Other Topics Concern  . None   Social History Narrative  . None   Family History  Problem Relation Age of Onset  . Diabetes Brother   . Alzheimer's disease Mother   . Cancer Neg Hx    Scheduled Meds: . allopurinol  100 mg Oral Daily  . amiodarone  200 mg Oral BID  . apixaban  5 mg Oral BID  . guaiFENesin  600 mg Oral BID  . insulin aspart  0-9 Units Subcutaneous TID WC  . methimazole  10 mg Oral TID  . pantoprazole  40 mg Oral Q0600  . polyethylene glycol  17 g Oral Daily  . sodium chloride flush  10-40 mL Intracatheter Q12H  . sodium chloride flush  3 mL Intravenous Q12H   Continuous Infusions: . sodium chloride     PRN Meds:.sodium chloride, acetaminophen, albuterol, alum & mag hydroxide-simeth, bisacodyl, guaiFENesin-dextromethorphan, ondansetron (ZOFRAN) IV, sodium chloride flush, sodium chloride flush, traMADol Medications Prior to Admission:  Prior to Admission medications   Medication Sig Start Date End Date Taking? Authorizing Provider  albuterol (PROVENTIL) (2.5 MG/3ML) 0.083% nebulizer solution Take 2.5 mg by nebulization every 6 (six) hours as needed for wheezing or shortness of breath.   Yes [provider]  allopurinol (ZYLOPRIM) 100 MG tablet TAKE ONE TABLET BY MOUTH ONCE DAILY 09/13/14  Yes Bensimhon, Shaune Pascal, MD  amiodarone (PACERONE) 200  MG tablet Take 1 tablet (200 mg total) by mouth daily. 06/06/13  Yes Bensimhon, Shaune Pascal, MD  apixaban (ELIQUIS) 5 MG TABS tablet Take 5 mg by mouth 2 (two) times daily.   Yes [provider]  Caffeine-Magnesium Salicylate (DIUREX PO) Take 1 tablet by mouth daily as needed (For water weight or bloating.).    Yes [provider]  carvedilol (COREG) 3.125 MG tablet Take 1 tablet (3.125 mg total) by mouth 2 (two) times daily. 06/06/13  Yes Bensimhon, Shaune Pascal, MD  colchicine 0.6 MG tablet Take 1 tablet (0.6 mg total) by mouth 2 (two) times daily as needed (gout flair). 03/16/14  Yes Delfina Redwood, MD  digoxin (LANOXIN) 0.25 MG tablet Take 0.125 mg by mouth every other day.   Yes [provider]  hydrALAZINE (APRESOLINE) 10 MG tablet Take 10 mg by mouth 3 (three) times daily.   Yes [provider]  isosorbide dinitrate (ISORDIL) 10 MG tablet Take 10 mg by mouth 3 (three) times daily.   Yes [provider]  lisinopril (PRINIVIL,ZESTRIL) 2.5 MG tablet Take 2.5 mg by mouth daily.   Yes [provider]  metolazone (ZAROXOLYN) 5 MG tablet Take 5 mg by mouth daily.   Yes [provider]  torsemide (DEMADEX) 20 MG tablet Take 2 tablets (40 mg total) by mouth 2 (two) times a week. Every Monday and Friday, Hold for wt less than 193 lb 03/16/14  Yes Delfina Redwood, MD  traMADol (ULTRAM) 50 MG tablet Take 1-2 tablets (50-100 mg total) by mouth every 6 (six) hours as needed for moderate pain or severe pain. 05/30/15  Liston Alba, MD   Allergies  Allergen Reactions  . Other Nausea And Vomiting    Patient stated drug is "kerein"? Formulation, dose, indication?   Review of Systems  Constitutional: Positive for activity change and appetite change.  Respiratory: Positive for shortness of breath.   Psychiatric/Behavioral: Positive for sleep disturbance.   Physical Exam General: Alert, awake, in no acute distress.  Flat and not willing to  engage much in the discussion. HEENT:  no JVD Heart: Regular rate and rhythm. No murmur appreciated. Lungs: Good air movement, clear Abdomen: Soft, nontender, nondistended, positive bowel sounds.  Ext: Compression wraps in place Skin: Warm and dry Neuro: Grossly intact, nonfocal.  Vital Signs: BP 116/80   Pulse 93   Temp 98.2 F (36.8 C) (Oral)   Resp (!) 21   Ht 6' (1.829 m) Comment: per pt previously documented 6' 2: was incorrect  Wt 84.9 kg (187 lb 3.2 oz)   SpO2 93%   BMI 25.39 kg/m  Pain Assessment: No/denies pain   Pain Score: 2    SpO2: SpO2: 93 % O2 Device:SpO2: 93 % O2 Flow Rate: .O2 Flow Rate (L/min): 2 L/min  IO: Intake/output summary:  Intake/Output Summary (Last 24 hours) at 07/10/16 1124 Last data filed at 07/10/16 1100  Gross per 24 hour  Intake            642.5 ml  Output                0 ml  Net            642.5 ml    LBM: Last BM Date: 07/09/16 Baseline Weight: Weight: 103 kg (227 lb) Most recent weight: Weight: 84.9 kg (187 lb 3.2 oz)     Palliative Assessment/Data:   Flowsheet Rows     Most Recent Value  Intake Tab  Referral Department  Hospitalist  Unit at Time of Referral  Intermediate Care Unit  Palliative Care Primary Diagnosis  Cardiac  Date Notified  07/09/16  Palliative Care Type  New Palliative care  Reason for referral  Clarify Goals of Care  Date of Admission  06/27/16  # of days IP prior to Palliative referral  12  Clinical Assessment  Psychosocial & Spiritual Assessment  Palliative Care Outcomes      Time In: 1000 Time Out: 1120 Time Total: 80 Greater than 50%  of this time was spent counseling and coordinating care related to the above assessment and plan.  Signed by: Micheline Rough, MD   Please contact Palliative Medicine Team phone at [phone] for questions and concerns.  For individual provider: See Shea Evans

## 2016-07-11 DIAGNOSIS — R4182 Altered mental status, unspecified: Secondary | ICD-10-CM

## 2016-07-11 DIAGNOSIS — Z7189 Other specified counseling: Secondary | ICD-10-CM

## 2016-07-11 DIAGNOSIS — Z515 Encounter for palliative care: Secondary | ICD-10-CM

## 2016-07-11 LAB — GLUCOSE, CAPILLARY
GLUCOSE-CAPILLARY: 99 mg/dL (ref 65–99)
GLUCOSE-CAPILLARY: 109 mg/dL — AB (ref 65–99)
GLUCOSE-CAPILLARY: 140 mg/dL — AB (ref 65–99)
Glucose-Capillary: 76 mg/dL (ref 65–99)
Glucose-Capillary: 80 mg/dL (ref 65–99)

## 2016-07-11 LAB — URINALYSIS, ROUTINE W REFLEX MICROSCOPIC
Hgb urine dipstick: NEGATIVE
PH: 8 (ref 5.0–8.0)
Specific Gravity, Urine: 1.015 (ref 1.005–1.030)

## 2016-07-11 LAB — RENAL FUNCTION PANEL
ANION GAP: 12 (ref 5–15)
Albumin: 2 g/dL — ABNORMAL LOW (ref 3.5–5.0)
BUN: 94 mg/dL — ABNORMAL HIGH (ref 6–20)
CALCIUM: 8.4 mg/dL — AB (ref 8.9–10.3)
CO2: 33 mmol/L — AB (ref 22–32)
Chloride: 92 mmol/L — ABNORMAL LOW (ref 101–111)
Creatinine, Ser: 2.91 mg/dL — ABNORMAL HIGH (ref 0.61–1.24)
GFR calc Af Amer: 25 mL/min — ABNORMAL LOW (ref >60)
GFR calc non Af Amer: 21 mL/min — ABNORMAL LOW (ref >60)
GLUCOSE: 73 mg/dL (ref 65–99)
Phosphorus: 5.8 mg/dL — ABNORMAL HIGH (ref 2.5–4.6)
Potassium: 3.8 mmol/L (ref 3.5–5.1)
SODIUM: 137 mmol/L (ref 135–145)

## 2016-07-11 LAB — COOXEMETRY PANEL
CARBOXYHEMOGLOBIN: 1.9 % — AB (ref 0.5–1.5)
Carboxyhemoglobin: 2 % — ABNORMAL HIGH (ref 0.5–1.5)
METHEMOGLOBIN: 1.2 % (ref 0.0–1.5)
METHEMOGLOBIN: 1 % (ref 0.0–1.5)
O2 Saturation: 71.4 %
O2 Saturation: 69.6 %
TOTAL HEMOGLOBIN: 13.6 g/dL (ref 12.0–16.0)
Total hemoglobin: 11.7 g/dL — ABNORMAL LOW (ref 12.0–16.0)

## 2016-07-11 LAB — CBC
HCT: 36.6 % — ABNORMAL LOW (ref 39.0–52.0)
Hemoglobin: 11.5 g/dL — ABNORMAL LOW (ref 13.0–17.0)
MCH: 24.2 pg — AB (ref 26.0–34.0)
MCHC: 31.4 g/dL (ref 30.0–36.0)
MCV: 77.1 fL — ABNORMAL LOW (ref 78.0–100.0)
PLATELETS: 67 10*3/uL — AB (ref 150–400)
RBC: 4.75 MIL/uL (ref 4.22–5.81)
RDW: 19.9 % — ABNORMAL HIGH (ref 11.5–15.5)
WBC: 11.4 10*3/uL — ABNORMAL HIGH (ref 4.0–10.5)

## 2016-07-11 LAB — URINALYSIS, MICROSCOPIC (REFLEX)

## 2016-07-11 LAB — MAGNESIUM: MAGNESIUM: 2.6 mg/dL — AB (ref 1.7–2.4)

## 2016-07-11 MED ORDER — DEXTROSE 5 % IV SOLN
1.0000 g | INTRAVENOUS | Status: DC
  Administered 2016-07-11 – 2016-07-14 (×4): 1 g via INTRAVENOUS
  Filled 2016-07-11 (×4): qty 10

## 2016-07-11 MED ORDER — COLCHICINE 0.6 MG PO TABS
1.2000 mg | ORAL_TABLET | Freq: Once | ORAL | Status: AC
  Administered 2016-07-11: 1.2 mg via ORAL
  Filled 2016-07-11: qty 2

## 2016-07-11 MED ORDER — COLCHICINE 0.6 MG PO TABS
0.3000 mg | ORAL_TABLET | Freq: Every day | ORAL | Status: DC
  Administered 2016-07-12 – 2016-07-14 (×3): 0.3 mg via ORAL
  Filled 2016-07-11 (×3): qty 1

## 2016-07-11 MED ORDER — TORSEMIDE 20 MG PO TABS
40.0000 mg | ORAL_TABLET | Freq: Every day | ORAL | Status: DC
  Administered 2016-07-12 – 2016-07-13 (×2): 40 mg via ORAL
  Filled 2016-07-11 (×2): qty 2

## 2016-07-11 NOTE — Progress Notes (Signed)
Transferred to 3east room18 by bed, report given to RN. Belongings with pt.

## 2016-07-11 NOTE — Progress Notes (Addendum)
Pt admitted to the unit as a transfer from 4N. Pt response to voice and pain; verbally responsive at times but refuses to answer questions when asked. Pt placed on telemetry; confirmed with CCMD; NT called to secondon verify. Pt oriented to the unit and room; fall/safety precaution and prevention education completed. Pt has foam dsg to sacrum; BLE wound dsg changed; condom cath on and intact. Bed alarm on and call light within reach. Will continue to closely monitor Pt. Delia Heady RN
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
## 2016-07-11 NOTE — Progress Notes (Signed)
Patient ID: Caleb Bauer, male   DOB: [birth year], 66 y.o.   MRN: [id number]     Advanced Heart Failure Rounding Note   Subjective:    Caleb Bauer is a 66 year old with a history of DMII, HTN, HF, COPD, former ETOH abuse (stopped drinking 2014), noncompliance, and systolic heart failure admitted with volume overload and AKI in the setting on medication noncompliance.    Coox 69.6% this am OFF milrinone. Palliative care consulted. Pt is a DNR. Plans for SNF. He refuses hospice consideration at this time. Renal signed off. Not HD candidate. Platelets 60 K today.   Today he is complaining of L knee pain.   Creatinine up to 2.91 from 2.56 yesterday. But this is near is baseline.   Objective:   Weight Range: 187 lb 11.2 oz (85.1 kg) Body mass index is 25.46 kg/m.   Vital Signs:   Temp:  [98.1 F (36.7 C)-99.3 F (37.4 C)] 99.3 F (37.4 C) (05/11 0748) Pulse Rate:  [93-101] 101 (05/11 0748) Resp:  [19-24] 24 (05/11 0748) BP: (103-116)/(67-74) 116/74 (05/11 0748) SpO2:  [91 %-100 %] 100 % (05/11 0748) Weight:  [187 lb 11.2 oz (85.1 kg)] 187 lb 11.2 oz (85.1 kg) (05/11 0600) Last BM Date: 07/09/16  Weight change: Filed Weights   07/09/16 0500 07/10/16 0326 07/11/16 0600  Weight: 196 lb (88.9 kg) 187 lb 3.2 oz (84.9 kg) 187 lb 11.2 oz (85.1 kg)    Intake/Output:   Intake/Output Summary (Last 24 hours) at 07/11/16 0838 Last data filed at 07/11/16 0600  Gross per 24 hour  Intake              300 ml  Output             1200 ml  Net             -900 ml     Physical Exam: CVP 4-5 General: Lying in bed. Complaining of knee pain.   HEENT: Normal Neck: supple. No JVD. Carotids 2+ bilat; no bruits. No lymphadenopathy or thyromegaly noted. Cor: PMI normal. RRR. No M/G/R noted.  Lungs: Clear.  Abdomen: Soft, NT, ND, no HSM. No bruits or masses. +BS  Extremities: No cyanosis, clubbing, or rash. +RUE PICC. Dressing C/D/I. Bilaterall compression hose without edema.  L knee slightly  swollen and warm. Tender to the touch.  Neuro: Alert & oriented x 3. Cranial nerves grossly intact. Moves all 4 extremities w/o difficulty. Affect flat.   Telemetry: Personally reviewed, NSR 90s   Labs: CBC  Recent Labs  07/10/16 0505 07/11/16 0415  WBC 7.4 11.4*  HGB 11.3* 11.5*  HCT 36.1* 36.6*  MCV 75.7* 77.1*  PLT 60* 67*   Basic Metabolic Panel  Recent Labs  07/10/16 0505 07/11/16 0415  NA 135 137  K 3.5 3.8  CL 88* 92*  CO2 34* 33*  GLUCOSE 86 73  BUN 94* 94*  CREATININE 2.56* 2.91*  CALCIUM 8.7* 8.4*  MG 1.6* 2.6*  PHOS 4.6 5.8*   Liver Function Tests  Recent Labs  07/10/16 0505 07/11/16 0415  ALBUMIN 2.0* 2.0*   No results for input(s): LIPASE, AMYLASE in the last 72 hours. Cardiac Enzymes No results for input(s): CKTOTAL, CKMB, CKMBINDEX, TROPONINI in the last 72 hours.  BNP: BNP (last 3 results)  Recent Labs  06/27/16 0349  BNP >4,500.0*    ProBNP (last 3 results) No results for input(s): PROBNP in the last 8760 hours.   D-Dimer No results for  input(s): DDIMER in the last 72 hours. Hemoglobin A1C No results for input(s): HGBA1C in the last 72 hours. Fasting Lipid Panel No results for input(s): CHOL, HDL, LDLCALC, TRIG, CHOLHDL, LDLDIRECT in the last 72 hours. Thyroid Function Tests No results for input(s): TSH, T4TOTAL, T3FREE, THYROIDAB in the last 72 hours.  Invalid input(s): FREET3  Other results:     Imaging/Studies:  No results found.    Medications:     Scheduled Medications: . allopurinol  100 mg Oral Daily  . amiodarone  200 mg Oral Daily  . apixaban  5 mg Oral BID  . guaiFENesin  600 mg Oral BID  . insulin aspart  0-9 Units Subcutaneous TID WC  . methimazole  10 mg Oral TID  . pantoprazole  40 mg Oral Q0600  . polyethylene glycol  17 g Oral Daily  . sodium chloride flush  10-40 mL Intracatheter Q12H  . sodium chloride flush  3 mL Intravenous Q12H    Infusions: . sodium chloride      PRN  Medications: sodium chloride, acetaminophen, albuterol, alum & mag hydroxide-simeth, bisacodyl, guaiFENesin-dextromethorphan, ondansetron (ZOFRAN) IV, sodium chloride flush, sodium chloride flush, traMADol   Assessment/Plan   1. Acute on chronic systolic CHF: Nonischemic cardiomyopathy.  Echo 4/18 with EF 20%, moderately dilated RV, moderate-severe Caleb and TR. Low output HF with creatinine up considerably.   - Coox 66% off milrinone - CVP 4-5. Continue to hold diuretics. Likely resume po tomorrow. Pt was taking torsemide 40 mg twice weekly at home.  Will discuss dosing with MD but suspect he will need at least 40 mg torsemide daily. Overall diuresed 40 pounds.  - No beta blocker with low output. No Ace/Arb/Dig/spiro with AKI.  - Hold off on hydralazine/nitrates with CKD. Need to avoid hypotension.   - Not candidate for advanced therapies due to social constraints and CKD. No change 2. Hyperthyroidism: New diagnosis. ? Related to amio.   - Continue methimazole as treatment of amiodarone-induced hyperthyroidism (now off prednisone). No change. 3. NSVT: NSVT and PVCs on milrinone.  Per EP, no definite benefit at this point to stopping amiodarone and may make hyperthyroidism worse by increasing conversion of T4=>T3.  Therefore, have elected not to use mexiletine and to keep him on amiodarone.  - Off milrinone. Continue amio once daily.   - Hyperthyroidism treated with methimazole. Now off prednisone. No change. Per primary.   4. Atrial fibrillation:  Paroxysmal.  - Maintaining NSR. Continue po amio.    - Platelets trending up. Continue eliquis.   5. Thrombocytopenia:  Doubt HIT as this preceded admission, looking back he has had thrombocytopenia since 3/17. ? Cirrhosis but CT in 2014 comments on normal liver. Platelets normal in 2016 but 98k in 3/17 - RUQ [place] --> no evidence of cirrhosis.  - Plts up to 67 K. Continue Eliquis.    6. AKI on CKD stage 3:  - Creatinine up to 2.9 from 2.5. Was dry  yesterday.  May also be from UTI.  - He is NOT an HD candidate. Renal recommends palliative care vs hospice.  7. Gout flare - Will order his colchicine dose 1.2 mg once and then 0.3 mg daily (dose reduced for CKD) starting tomorrow while flaring.  - hold allopurinol while in flair.  8. UTI - UA turbid with many bacteria.  Will need ABX. Per primary.   Will need SNF. Have recommended hospice or at least palliative but patient resistant.  Now with UTI.  Hopefully to SNF in  next 48 hours. Will make follow up in HF clinic.   Length of Stay: [address]  [name]  07/11/2016, 8:38 AM  Advanced Heart Failure Team Pager [phone] (M-F; 7a - 4p)  Please contact [facility] for night-coverage after hours (4p -7a ) and weekends on [email or web address]  Patient seen with PA, agree with the above note.  Co-ox is ok off milrinone, CVP still not elevated.  Creatinine a bit higher today.   He is confused today, may be due to UTI.  Ceftriaxone started.   Start on torsemide 40 mg daily tomorrow.   He has end-stage cardiomyopathy.  He is not a candidate for LVAD or transplant, would avoid home milrinone give significant questions about compliance.  He is not a candidate for HD.  Palliative care following.  Think he would be best served by hospice care but patient is not ready at this point.  Plan for SNF when ready for discharge.  May use Zyprexa for agitated delirium if needed, can use Haldol as a last resort given prolonged QTc.   Loralie Champagne 07/11/2016 1:41 PM

## 2016-07-11 NOTE — Progress Notes (Signed)
Becoming confused, complained of  backpain, screaming at times, tramadol given, md aware of many bacteria in the urine with order.

## 2016-07-11 NOTE — Progress Notes (Signed)
Physical Therapy Treatment Patient Details Name: Caleb Bauer MRN: [id number] DOB: [birth year] Today's Date: 07/11/2016    History of Present Illness Patient is a 66 yo male admitted 06/27/16 with SOB and LE edema/cellulitis.  Patient with CHF, acute on CKD, and LE wounds/blisters.     PMH:  BLE cellulitis to blisters and skin loss, Lt posterior leg wound, COPD, DM, HTN, CHF, NICM, EF 20%, obesity, CKD.  Pt also found to have a UTI and it is causing some delerium.      PT Comments    This pt is getting less and less mobile.  He is even more confused than last session making it harder for him to motor plan, stay attended to task, and initiate movement.  I was able to muscle him up to the side of the bed, but even from an elevated bed and 20 minutes of trying he was unable to initiate the move to stand up.  Pt is very appropriate for SNF level rehab at this time as he is completely unsafe to go home and physically cannot get out of bed.  Goals re assessed and downgraded today.   Follow Up Recommendations  SNF     Equipment Recommendations  Wheelchair (measurements PT);Wheelchair cushion (measurements PT);Hospital bed;Other (comment) (hoyer lift)    Recommendations for Other Services   NA     Precautions / Restrictions Precautions Precautions: Fall    Mobility  Bed Mobility Overal bed mobility: Needs Assistance Bed Mobility: Supine to Sit;Sit to Supine     Supine to sit: Max assist Sit to supine: Max assist   General bed mobility comments: Max assist to progress bil legs to EOB and support trunk from el3evated HOB (from behind) to get to sitting EOB.  Pt not assisting with move initially until over half way up to sitting and then trunk and arm prop kicked in.  Even with hand over hand assist I could not get him to try to reach for the rail and pull.   Transfers Overall transfer level: Needs assistance               General transfer comment: Attempted to stand multiple times  at EOB,  pt unable to initiate and screamed (for no reason relayed ) every time I attempted to initiate movement.  I continued to elevate the bed to make it easier, but pt was unable to stand EOB today.  I think cognitive deficits were a big piece of that today.    Ambulation/Gait             General Gait Details: unable at this time.           Balance Overall balance assessment: Needs assistance Sitting-balance support: Feet supported;Bilateral upper extremity supported Sitting balance-Leahy Scale: Fair Sitting balance - Comments: Pt able to sit EOB with bil upper extremity prop.  When even single extremity removed, or attempted dynamic movement in sitting (scooting) posterior LOB.   Postural control: Posterior lean                                  Cognition Arousal/Alertness: Awake/alert Behavior During Therapy: Anxious Overall Cognitive Status: Impaired/Different from baseline Area of Impairment: Orientation;Attention;Memory;Following commands;Safety/judgement;Awareness;Problem solving                 Orientation Level: Disoriented to;Place;Time;Situation Current Attention Level: Sustained (needs frequent redirection)   Following Commands: Follows one step commands inconsistently  Safety/Judgement: Decreased awareness of safety;Decreased awareness of deficits Awareness: Intellectual Problem Solving: Slow processing;Decreased initiation;Requires verbal cues;Requires tactile cues General Comments: Pt with significant difficulty with initiation.  He constantly says "wait, wait", but cannot relay what we are waiting on, is redirected to what we are doing, and then says "wait, wait" again.  When motion can be initiate by PT he will complete the motion, but does not assist at all initially.              Pertinent Vitals/Pain Pain Assessment: Faces Faces Pain Scale: Hurts whole lot Pain Location: bil legs, back "all over" Pain Descriptors / Indicators:  Grimacing;Guarding Pain Intervention(s): Limited activity within patient's tolerance;Monitored during session;Repositioned;Patient requesting pain meds-RN notified;RN gave pain meds during session           PT Goals (current goals can now be found in the care plan section) Acute Rehab PT Goals Patient Stated Goal: To decrease pain. To return home. PT Goal Formulation: With patient Time For Goal Achievement: 07/25/16 Potential to Achieve Goals: Fair Progress towards PT goals: Not progressing toward goals - comment;Goals downgraded-see care plan (Pt is getting less and less mobile, more and more confused)    Frequency    Min 2X/week      PT Plan Current plan remains appropriate;Frequency needs to be updated       AM-PAC PT "6 Clicks" Daily Activity  Outcome Measure  Difficulty turning over in bed (including adjusting bedclothes, sheets and blankets)?: Total Difficulty moving from lying on back to sitting on the side of the bed? : Total Difficulty sitting down on and standing up from a chair with arms (e.g., wheelchair, bedside commode, etc,.)?: Total Help needed moving to and from a bed to chair (including a wheelchair)?: Total Help needed walking in hospital room?: Total Help needed climbing 3-5 steps with a railing? : Total 6 Click Score: 6    End of Session   Activity Tolerance: Patient limited by pain (and confusion) Patient left: in bed;with call bell/phone within reach;with bed alarm set   PT Visit Diagnosis: Unsteadiness on feet (R26.81);Other abnormalities of gait and mobility (R26.89);Muscle weakness (generalized) (M62.81);Difficulty in walking, not elsewhere classified (R26.2);Pain Pain - Right/Left:  (both) Pain - part of body: Leg     Time: 2080-2233 PT Time Calculation (min) (ACUTE ONLY): 43 min  Charges:  $Therapeutic Activity: 23-37 mins 1 re eval         Keesha Pellum B. [place], [place], DPT [phone]            07/11/2016, 5:39 PM

## 2016-07-11 NOTE — Progress Notes (Signed)
CM following for DCP; patient transferred today to [facility]; SW following for SNF placement; B Pennie Rushing [phone]

## 2016-07-11 NOTE — Progress Notes (Signed)
Palliative care progress note  Reason for visit: Goals of care in light of end stage heart failure, agitation (suspect delirium related to UTI)  I saw and examined Mr. Iyer this morning.  On arrival to floor, he was screaming out into the hallway.  Discuss with his bedside RN who reports that he has been more confused this morning.  Noted urinary tract infection this morning with order for antibiotics.  I'm concerned that he is developing delirium related to urinary tract infection.  Reviewed his chart and noted to have QTC of greater than 475 this morning when checked by central monitoring.    I called and discussed with heart failure team. With his underlying failure and QTC, it would obviously be preferable to forego adding further medications that may contribute to prolonging QTC.  I am concerned, however, that if his delirium continues to worsen it will be necessary to treat pharmacologically for his own safety.  With this in mind, I would recommend starting with Zyprexa 5mg  (pill or ODT) if his delirium continues to worsen as this would have less effect on QTC than Haldol.  If, however, this is ineffective, the benefit of IV Haldol would outweigh the risk associated with QTC prolongation if it becomes a matter of patient and staff safety.    Appreciate heart failure team's input.  They are in agreement that this becomes a risk versus benefit decision and, if he requires pharmacologic therapy for delirium, starting with Zyprexa but then utilizing IV Haldol if necessary would be a reasonable course of action.  On reexamination after this, his nurse was preparing to hang antibiotics.  He was also much more calm during the second encounter and was able to answer most questions appropriately. Will therefore hold off on addition of any further medications at this time in favor of continued nonpharmacologic interventions as below.  Standard delirium management (adapted from NICE guidelines 2011 for  prevention of delirium):  Provide continuity of care when possible (avoid frequent changing of surroundings and staff).  Frequent reorientation to time with:  A clock should always be visible.  Make sure Calendar/white board is updated.  Lights on/blinds open during the day and off/closed at night.  Encourage frequent family visits.  Monitor for and treat dehydration/constipation.  Optimize oxygen saturation.  Avoid catheters and IV's when possible and look for/treat infections.  Encourage early mobility.  Assess and treat pain.  Ensure adequate nutrition and functioning dentures.  Address reversible causes of hearing and visual impairment:  Use pocket talker if hearing aids are unavailable.  Avoid sleep disturbance (normalize sleep/wake cycle).  Minimize disturbances and consider NOT obtaining vitals at night if possible.  Review Medications to avoid polypharmacy and avoid deliriogenic medications when possible:  Benzodiazepines.  Dihydropyridines.  Antihistamines.  Anticholinergics.  (Possibly avoid: H2 blockers, tricyclic antidepressants, antiparkinson medications, steroids, NSAID's).   Total time spent: 30 minutes Greater than 50%  of this time was spent counseling and coordinating care related to the above assessment and plan.  Micheline Rough, MD [facility] Team [phone]

## 2016-07-11 NOTE — Progress Notes (Signed)
RT placed patient on CPAP HS on auto 29max and 31min. NO O2 bleed in needed. Patient tolerating well at this time.

## 2016-07-12 LAB — GLUCOSE, CAPILLARY
GLUCOSE-CAPILLARY: 80 mg/dL (ref 65–99)
Glucose-Capillary: 102 mg/dL — ABNORMAL HIGH (ref 65–99)
Glucose-Capillary: 101 mg/dL — ABNORMAL HIGH (ref 65–99)
Glucose-Capillary: 93 mg/dL (ref 65–99)

## 2016-07-12 LAB — COOXEMETRY PANEL
Carboxyhemoglobin: 2.1 % — ABNORMAL HIGH (ref 0.5–1.5)
METHEMOGLOBIN: 1 % (ref 0.0–1.5)
O2 Saturation: 55.1 %
Total hemoglobin: 11.8 g/dL — ABNORMAL LOW (ref 12.0–16.0)

## 2016-07-12 LAB — RENAL FUNCTION PANEL
Albumin: 2 g/dL — ABNORMAL LOW (ref 3.5–5.0)
Anion gap: 16 — ABNORMAL HIGH (ref 5–15)
BUN: 112 mg/dL — AB (ref 6–20)
CHLORIDE: 90 mmol/L — AB (ref 101–111)
CO2: 32 mmol/L (ref 22–32)
Calcium: 8.6 mg/dL — ABNORMAL LOW (ref 8.9–10.3)
Creatinine, Ser: 3.78 mg/dL — ABNORMAL HIGH (ref 0.61–1.24)
GFR calc Af Amer: 18 mL/min — ABNORMAL LOW (ref >60)
GFR calc non Af Amer: 15 mL/min — ABNORMAL LOW (ref >60)
GLUCOSE: 107 mg/dL — AB (ref 65–99)
POTASSIUM: 4.1 mmol/L (ref 3.5–5.1)
Phosphorus: 7.4 mg/dL — ABNORMAL HIGH (ref 2.5–4.6)
Sodium: 138 mmol/L (ref 135–145)

## 2016-07-12 LAB — CBC
HEMATOCRIT: 36.4 % — AB (ref 39.0–52.0)
Hemoglobin: 11.6 g/dL — ABNORMAL LOW (ref 13.0–17.0)
MCH: 24.5 pg — ABNORMAL LOW (ref 26.0–34.0)
MCHC: 31.9 g/dL (ref 30.0–36.0)
MCV: 76.8 fL — AB (ref 78.0–100.0)
Platelets: 86 10*3/uL — ABNORMAL LOW (ref 150–400)
RBC: 4.74 MIL/uL (ref 4.22–5.81)
RDW: 20.2 % — ABNORMAL HIGH (ref 11.5–15.5)
WBC: 13 10*3/uL — ABNORMAL HIGH (ref 4.0–10.5)

## 2016-07-12 LAB — URINE CULTURE

## 2016-07-12 LAB — MAGNESIUM: Magnesium: 2.5 mg/dL — ABNORMAL HIGH (ref 1.7–2.4)

## 2016-07-12 MED ORDER — SODIUM CHLORIDE 0.9 % IV BOLUS (SEPSIS)
500.0000 mL | Freq: Once | INTRAVENOUS | Status: AC
  Administered 2016-07-12: 500 mL via INTRAVENOUS

## 2016-07-12 NOTE — Progress Notes (Signed)
Patient ID: Caleb Bauer, male   DOB: 1950-07-06, 66 y.o.   MRN: [id number]     Advanced Heart Failure Rounding Note   Subjective:    Caleb Bauer is a 66 year old with a history of DMII, HTN, HF, COPD, former ETOH abuse (stopped drinking 2014), noncompliance, and systolic heart failure admitted with volume overload and AKI in the setting on medication noncompliance.    Remains off milrinone/ Coox 55% this am (down from 69%). Feels ok. Lying flat. No dyspnea.   Weight down. Creatinine up from 2.9 to 3.8     Objective:   Weight Range: 84.3 kg (185 lb 12.8 oz) Body mass index is 25.2 kg/m.   Vital Signs:   Temp:  [98.5 F (36.9 C)-98.6 F (37 C)] 98.6 F (37 C) (05/12 0448) Pulse Rate:  [88-91] 91 (05/12 0448) Resp:  [18-19] 18 (05/12 0448) BP: (107-109)/(69-73) 109/73 (05/12 0448) SpO2:  [99 %-100 %] 100 % (05/12 0448) Weight:  [84.3 kg (185 lb 12.8 oz)] 84.3 kg (185 lb 12.8 oz) (05/12 0448) Last BM Date: 07/10/16  Weight change: Filed Weights   07/11/16 0600 07/11/16 1432 07/12/16 0448  Weight: 85.1 kg (187 lb 11.2 oz) 86.8 kg (191 lb 5.8 oz) 84.3 kg (185 lb 12.8 oz)    Intake/Output:   Intake/Output Summary (Last 24 hours) at 07/12/16 1547 Last data filed at 07/12/16 1001  Gross per 24 hour  Intake                3 ml  Output              250 ml  Net             -247 ml     Physical Exam: General: Lying in bed. NAD. Calm HEENT: normal x for poor dentition Neck: supple. no JVD. Carotids 2+ bilat; no bruits. No lymphadenopathy or thryomegaly appreciated. Cor: PMI laterally displaced. RRR +s3 Lungs: clear Abdomen: soft, nontender, nondistended. No hepatosplenomegaly. No bruits or masses. Good bowel sounds. Extremities: no cyanosis, clubbing, rash, edema  multicolord fingernails Neuro: alert & orientedx3, cranial nerves grossly intact. moves all 4 extremities w/o difficulty. Affect pleasant   Telemetry: NSR 90s Personally reviewed   Labs: CBC  Recent Labs  07/11/16 0415 07/12/16 0345  WBC 11.4* 13.0*  HGB 11.5* 11.6*  HCT 36.6* 36.4*  MCV 77.1* 76.8*  PLT 67* 86*   Basic Metabolic Panel  Recent Labs  07/11/16 0415 07/12/16 0345  NA 137 138  K 3.8 4.1  CL 92* 90*  CO2 33* 32  GLUCOSE 73 107*  BUN 94* 112*  CREATININE 2.91* 3.78*  CALCIUM 8.4* 8.6*  MG 2.6* 2.5*  PHOS 5.8* 7.4*   Liver Function Tests  Recent Labs  07/11/16 0415 07/12/16 0345  ALBUMIN 2.0* 2.0*   No results for input(s): LIPASE, AMYLASE in the last 72 hours. Cardiac Enzymes No results for input(s): CKTOTAL, CKMB, CKMBINDEX, TROPONINI in the last 72 hours.  BNP: BNP (last 3 results)  Recent Labs  06/27/16 0349  BNP >4,500.0*    ProBNP (last 3 results) No results for input(s): PROBNP in the last 8760 hours.   D-Dimer No results for input(s): DDIMER in the last 72 hours. Hemoglobin A1C No results for input(s): HGBA1C in the last 72 hours. Fasting Lipid Panel No results for input(s): CHOL, HDL, LDLCALC, TRIG, CHOLHDL, LDLDIRECT in the last 72 hours. Thyroid Function Tests No results for input(s): TSH, T4TOTAL, T3FREE, THYROIDAB in the  last 72 hours.  Invalid input(s): FREET3  Other results:     Imaging/Studies:  No results found.    Medications:     Scheduled Medications: . amiodarone  200 mg Oral Daily  . apixaban  5 mg Oral BID  . colchicine  0.3 mg Oral Daily  . guaiFENesin  600 mg Oral BID  . insulin aspart  0-9 Units Subcutaneous TID WC  . methimazole  10 mg Oral TID  . pantoprazole  40 mg Oral Q0600  . polyethylene glycol  17 g Oral Daily  . sodium chloride flush  10-40 mL Intracatheter Q12H  . sodium chloride flush  3 mL Intravenous Q12H  . torsemide  40 mg Oral Daily    Infusions: . sodium chloride    . cefTRIAXone (ROCEPHIN)  IV 1 g (07/12/16 1000)    PRN Medications: sodium chloride, acetaminophen, albuterol, alum & mag hydroxide-simeth, bisacodyl, guaiFENesin-dextromethorphan, ondansetron (ZOFRAN) IV,  sodium chloride flush, sodium chloride flush   Assessment/Plan   1. Acute on chronic systolic CHF: Nonischemic cardiomyopathy.  Echo 4/18 with EF 20%, moderately dilated RV, moderate-severe Caleb and TR. Low output HF with creatinine up considerably.   2. Hyperthyroidism: New diagnosis. ? Related to amio.   - Continue methimazole as treatment of amiodarone-induced hyperthyroidism (now off prednisone). No change. 3. NSVT: NSVT and PVCs on milrinone.  Per EP, no definite benefit at this point to stopping amiodarone and may make hyperthyroidism worse by increasing conversion of T4=>T3.  Therefore, have elected not to use mexiletine and to keep him on amiodarone.  - Hyperthyroidism treated with methimazole. Now off prednisone. No change. Per primary.   4. Atrial fibrillation:  Paroxysmal.  - Maintaining NSR. Continue po amio.   Continue eliquis.   5. Thrombocytopenia:  Doubt HIT as this preceded admission, looking back he has had thrombocytopenia since 3/17. ? Cirrhosis but CT in 2014 comments on normal liver. Platelets normal in 2016 but 98k in 3/17 - RUQ [place] --> no evidence of cirrhosis.  - Plts up to 86 K. Continue Eliquis.    6. AKI on CKD stage 3:  - Creatinine up to 3.8. Was dry yesterday.  May also be from UTI.  - He is NOT an HD candidate. Renal recommends palliative care vs hospice.  7. Gout flare - Will order his colchicine dose 1.2 mg once and then 0.3 mg daily (dose reduced for CKD) starting tomorrow while flaring.  - hold allopurinol while in flair.  8. UTI - UA turbid with many bacteria.  Will need ABX. Per primary.   He has end-stage HF with recurrent renal failure likely due to combination of low output and volume depletion. He is not candidate for IV inotropes or advanced therapies. Will give 500cc IVF back. Only option at this point is Hospice. Palliative care team is involved.    Length of Stay: 15  Glori Bickers, MD  07/12/2016, 3:47 PM  Advanced Heart Failure  Team Pager [phone] (M-F; 7a - 4p)  Please contact [facility] for night-coverage after hours (4p -7a ) and weekends on [email or web address]
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
## 2016-07-12 NOTE — Progress Notes (Signed)
Patient offered to transfer from bed to chair several times patient refused. Dressing changed to buttocks  and BLE. No signs and symptoms of  distress noted.

## 2016-07-12 NOTE — Clinical Social Work Note (Signed)
CSW met with patient. When asked if he had decided on a facility he stated that he would stay here. CSW asked what he meant and patient stated that he was "twisted" meaning confused. Patient stated that he has not decided on an outside facility but still had the list of bed offers. CSW will check back at a later time. Patient wanted to know if he could get out of bed. CSW notified RN.  Dayton Scrape, [place]

## 2016-07-13 DIAGNOSIS — Z87891 Personal history of nicotine dependence: Secondary | ICD-10-CM

## 2016-07-13 DIAGNOSIS — R4189 Other symptoms and signs involving cognitive functions and awareness: Secondary | ICD-10-CM

## 2016-07-13 DIAGNOSIS — Z515 Encounter for palliative care: Secondary | ICD-10-CM

## 2016-07-13 DIAGNOSIS — E783 Hyperchylomicronemia: Secondary | ICD-10-CM

## 2016-07-13 DIAGNOSIS — Z810 Family history of intellectual disabilities: Secondary | ICD-10-CM

## 2016-07-13 LAB — COOXEMETRY PANEL
CARBOXYHEMOGLOBIN: 2.4 % — AB (ref 0.5–1.5)
Methemoglobin: 0.7 % (ref 0.0–1.5)
O2 SAT: 72.3 %
TOTAL HEMOGLOBIN: 12.2 g/dL (ref 12.0–16.0)

## 2016-07-13 LAB — GLUCOSE, CAPILLARY
GLUCOSE-CAPILLARY: 87 mg/dL (ref 65–99)
Glucose-Capillary: 89 mg/dL (ref 65–99)
Glucose-Capillary: 105 mg/dL — ABNORMAL HIGH (ref 65–99)
Glucose-Capillary: 99 mg/dL (ref 65–99)

## 2016-07-13 LAB — RENAL FUNCTION PANEL
ALBUMIN: 2 g/dL — AB (ref 3.5–5.0)
Anion gap: 14 (ref 5–15)
BUN: 114 mg/dL — AB (ref 6–20)
CALCIUM: 8.3 mg/dL — AB (ref 8.9–10.3)
CO2: 30 mmol/L (ref 22–32)
CREATININE: 4.48 mg/dL — AB (ref 0.61–1.24)
Chloride: 90 mmol/L — ABNORMAL LOW (ref 101–111)
GFR calc Af Amer: 15 mL/min — ABNORMAL LOW (ref >60)
GFR calc non Af Amer: 13 mL/min — ABNORMAL LOW (ref >60)
GLUCOSE: 98 mg/dL (ref 65–99)
PHOSPHORUS: 6.8 mg/dL — AB (ref 2.5–4.6)
POTASSIUM: 4.1 mmol/L (ref 3.5–5.1)
SODIUM: 134 mmol/L — AB (ref 135–145)

## 2016-07-13 LAB — CBC
HEMATOCRIT: 35.5 % — AB (ref 39.0–52.0)
Hemoglobin: 11.6 g/dL — ABNORMAL LOW (ref 13.0–17.0)
MCH: 24.6 pg — ABNORMAL LOW (ref 26.0–34.0)
MCHC: 32.7 g/dL (ref 30.0–36.0)
MCV: 75.2 fL — ABNORMAL LOW (ref 78.0–100.0)
Platelets: 94 10*3/uL — ABNORMAL LOW (ref 150–400)
RBC: 4.72 MIL/uL (ref 4.22–5.81)
RDW: 20.4 % — AB (ref 11.5–15.5)
WBC: 10.6 10*3/uL — AB (ref 4.0–10.5)

## 2016-07-13 LAB — MAGNESIUM: MAGNESIUM: 2.5 mg/dL — AB (ref 1.7–2.4)
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
## 2016-07-13 NOTE — Progress Notes (Signed)
07/13/2016- Respiratory care note- Pt placed on cpap for the night.  Pt tolerating well.  No distress

## 2016-07-13 NOTE — Progress Notes (Signed)
Daily Progress Note   Patient Name: Caleb Bauer       Date: 07/13/2016 DOB: February 03, 1951  Age: 66 y.o. MRN#: [id number] Attending Physician: Mendel Corning, MD Primary Care Physician: [name], MD Admit Date: 06/27/2016  Reason for Consultation/Follow-up: Establishing goals of care, Hospice Evaluation and Psychosocial/spiritual support  Subjective: Pt is alert and oriented to place but thought processes exhibit flight of ideas; no insight into current clinical condition or safety needs. See that he has been seen by psychiatry and deemed to lack capacity. He told me he does not know where he lives . States his niece is going to help him find a place to live. I told him we could do that more efficiently than she could and he agreed. Again, it is doubtful that he understands what we were just talking about. He was pleasant and cooperative. Speech garbled. He states he feels well and did acknowledge he has heart disease but "its all better now".  Length of Stay: 16  Current Medications: Scheduled Meds:  . amiodarone  200 mg Oral Daily  . apixaban  5 mg Oral BID  . colchicine  0.3 mg Oral Daily  . guaiFENesin  600 mg Oral BID  . insulin aspart  0-9 Units Subcutaneous TID WC  . methimazole  10 mg Oral TID  . pantoprazole  40 mg Oral Q0600  . polyethylene glycol  17 g Oral Daily  . sodium chloride flush  10-40 mL Intracatheter Q12H  . sodium chloride flush  3 mL Intravenous Q12H  . torsemide  40 mg Oral Daily    Continuous Infusions: . sodium chloride    . cefTRIAXone (ROCEPHIN)  IV Stopped (07/13/16 1324)    PRN Meds: sodium chloride, acetaminophen, albuterol, alum & mag hydroxide-simeth, bisacodyl, guaiFENesin-dextromethorphan, ondansetron (ZOFRAN) IV, sodium chloride  flush, sodium chloride flush  Physical Exam  Constitutional: He appears well-developed and well-nourished.  Eyes: EOM are normal. Pupils are equal, round, and reactive to light.  Neck: Normal range of motion.  Pulmonary/Chest: Effort normal.  Musculoskeletal: Normal range of motion.  Neurological: He is alert.  Oriented t self, that he is in the hospital  Skin: Skin is warm and dry.  Psychiatric:  Flight of ideas Impaired judgement and very poor insight   Nursing note and vitals reviewed.  Vital Signs: BP 103/75 (BP Location: Left Arm)   Pulse 77   Temp 98.4 F (36.9 C) (Oral)   Resp 18   Ht 6' (1.829 m) Comment: per pt previously documented 6' 2: was incorrect  Wt 85.4 kg (188 lb 3.2 oz)   SpO2 94%   BMI 25.52 kg/m  SpO2: SpO2: 94 % O2 Device: O2 Device: Not Delivered O2 Flow Rate: O2 Flow Rate (L/min): 1 L/min  Intake/output summary:  Intake/Output Summary (Last 24 hours) at 07/13/16 1346 Last data filed at 07/13/16 7628  Gross per 24 hour  Intake              243 ml  Output               50 ml  Net              193 ml   LBM: Last BM Date: 07/13/16 Baseline Weight: Weight: 103 kg (227 lb) Most recent weight: Weight: 85.4 kg (188 lb 3.2 oz)       Palliative Assessment/Data:    Flowsheet Rows     Most Recent Value  Intake Tab  Referral Department  Hospitalist  Unit at Time of Referral  Intermediate Care Unit  Palliative Care Primary Diagnosis  Cardiac  Date Notified  07/09/16  Palliative Care Type  New Palliative care  Reason for referral  Clarify Goals of Care  Date of Admission  06/27/16  # of days IP prior to Palliative referral  12  Clinical Assessment  Psychosocial & Spiritual Assessment  Palliative Care Outcomes      Patient Active Problem List   Diagnosis Date Noted  . Metabolic alkalosis 31/51/7616  . Hyponatremia 07/05/2016  . PAF (paroxysmal atrial fibrillation) ([place])   . Obesity (BMI 30.0-34.9) 07/02/2016  . AKI (acute kidney  injury) ([place])   . Ventricular tachycardia, nonsustained ([place])   . Amiodarone-induced thyroiditis   . 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 Pressure injury of skin 06/28/2016  . Chest pain 06/27/2016  . Acute on chronic combined systolic (congestive) and diastolic (congestive) heart failure ([place]) 06/27/2016  . Bilateral leg edema 06/27/2016  . Sleep apnea   . Anasarca 07/26/2014  . Chronic gouty arthritis 12/21/2013  . Acute renal failure superimposed on stage 3 chronic kidney disease ([place]) 09/12/2013  . DM (diabetes mellitus), type 2 with renal complications ([place]) 07/37/1062  . Cardiorenal syndrome with renal failure 04/09/2013  . Hypokalemia 04/07/2013  . Acute respiratory failure ([place]) 03/10/2013  . Gout   . Iron deficiency anemia 12/14/2012  . GERD (gastroesophageal reflux disease)   . Atrial flutter by electrocardiogram ([place])   . Physical deconditioning 11/30/2012  . COPD (chronic obstructive pulmonary disease) ([place])   . History of ventricular tachycardia 09/17/2012  . 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 In the ED, he was given 80 mg of Lasix and 324 mg of aspirin and referred to [facility] for further care. BNP greater than 4500 on admission. 2-D echo showed an EF  of 20% with moderate to severe MR and a moderately dilated RV. Not felt to be a candidate for ICD or pacemaker due to history of noncompliance and multiple medical comorbidities.  PICC line placed 06/30/16. Cardiology, nephrology following. Coox now decreased off milrinn from 69 to 55. His creatinine is risig and today 3.8 up from 2.9   Recommendations/Plan:  Urticaria: Will order Eucerin cream. Likely from kidney disease  Best plan is for pt to DC to SNF with palliative consult to reassess pt in SNF after DC. Pt lacks capacity to elect other level of services such as hospice and has no insight or ability to care for himself in his current condition. Chart reviewed and see that CM involved with pt's niece to obtain SNF bed  His objective clinical status in my opinion does qualify him for his hospice medicare benefit in the facility should family wish to consider this level of service  Code Status:    Code Status Orders        Start     Ordered   07/10/16 1044  Do not attempt resuscitation (DNR)  Continuous    Question Answer Comment  In the event of cardiac or respiratory ARREST Do not call a "code blue"   In the event of cardiac or respiratory ARREST Do not perform Intubation, CPR, defibrillation or ACLS   In the event of cardiac or respiratory ARREST Use medication by any route, position, wound care, and other measures to relive pain and suffering. May use oxygen, suction and manual treatment of airway obstruction as needed for comfort.      07/10/16 1043    Code Status History    Date Active Date Inactive Code Status Order ID Comments User Context   07/07/2016 10:28 PM 07/10/2016 10:43 AM Full Code [id number]  Alonna Buckler, RN Inpatient   07/07/2016 10:06 PM 07/07/2016 10:28 PM Full Code [id number]  Alonna Buckler, RN Inpatient   06/27/2016  7:03 AM 06/30/2016 10:27 AM Full Code [id number]  Radene Gunning, NP ED   07/26/2014  3:44 PM 07/29/2014  5:17 PM Full Code [id number]  Adrian Prows, MD Inpatient   03/13/2014  6:00 PM 03/16/2014  4:34 PM DNR [id number]  Adrian Prows, MD Inpatient   03/13/2014 11:42 AM 03/13/2014  6:00 PM DNR [id number]  Gerlene Fee, RN Inpatient    03/10/2014  8:24 PM 03/13/2014 11:42 AM Full Code [id number]  Etta Quill, DO ED   09/12/2013  3:40 AM 09/15/2013  5:32 PM Full Code [id number]  Rise Patience, MD Inpatient   07/12/2013  6:31 PM 07/14/2013  6:54 PM Full Code [id number]  Jonetta Osgood, MD ED   04/07/2013  9:33 AM 04/11/2013  3:49 PM Full Code [id number]  Geradine Girt, DO Inpatient   03/11/2013 10:07 AM 03/15/2013  2:58 PM Partial Code [id number]  Chesley Mires, MD Inpatient   03/10/2013  6:33 AM 03/11/2013 10:07 AM DNR [id number]  Lucious Groves, DO Inpatient   03/10/2013  2:48 AM 03/10/2013  6:33 AM DNR [id number]  Olga Millers, MD Inpatient   03/10/2013  2:15 AM 03/10/2013  2:48 AM Full Code [id number]  Lucious Groves, DO Inpatient   12/06/2012 10:37 PM 12/09/2012  6:28 PM Full Code [id number]  Rise Patience, MD Inpatient   11/24/2012  3:29 AM 12/02/2012  8:59 PM Full Code [id number]  Rise Patience, MD Inpatient   10/07/2012  3:30 AM 10/14/2012  6:47 PM Full Code [id number]  Etta Quill, DO Inpatient   09/10/2012  9:32 PM 09/27/2012 11:12 PM Full Code [id number]  Derrill Kay, MD Inpatient       Prognosis:   < 6 months in the setting of end stage CHF with EF 20%, worsening kidney function. Not a candidate for HD, or inotropes or other interventions  Discharge Planning:  [place] for rehab with Palliative care service follow-up  Care plan was discussed with Dr. Domingo Cocking  Thank you for allowing the Palliative Medicine Team to assist in the care of this patient.   Time In: 1330 Time Out: 1400 Total Time 30 min Prolonged Time Billed  no       Greater than 50%  of this time was spent counseling and coordinating care related to the above assessment and plan.  Dory Horn, NP  Please contact Palliative Medicine Team phone at [phone] for questions and concerns.

## 2016-07-13 NOTE — Progress Notes (Signed)
Pt states he is not ready for his CPAP but will call later when he is.

## 2016-07-13 NOTE — Consult Note (Signed)
[facility] Consult   Reason for Consult:  Capacity Referring Physician:  Dr. Tana Coast Patient Identification: Caleb Bauer MRN:  [id number] Principal Diagnosis: Acute on chronic combined systolic (congestive) and diastolic (congestive) heart failure [facility]) Diagnosis:   Patient Active Problem List   Diagnosis Date Noted  . Metabolic alkalosis [W65.6] 07/07/2016  . Hyponatremia [E87.1] 07/05/2016  . PAF (paroxysmal atrial fibrillation) ([place]) [I48.0]   . Obesity (BMI 30.0-34.9) [E66.9] 07/02/2016  . AKI (acute kidney injury) ([place]) [N17.9]   . Ventricular tachycardia, nonsustained ([place]) [I47.2]   . 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 Pressure injury of skin [L89.90] 06/28/2016  . Chest pain [R07.9] 06/27/2016  . Acute on chronic combined systolic (congestive) and diastolic (congestive) heart failure ([place]) [I50.43] 06/27/2016  . Bilateral leg edema [R60.0] 06/27/2016  . Sleep apnea [G47.30]   . Anasarca [R60.1] 07/26/2014  . Chronic gouty arthritis [M1A.00X0] 12/21/2013  . Acute renal failure superimposed on stage 3 chronic kidney disease ([place]) [N17.9, N18.3] 09/12/2013  . DM (diabetes mellitus), type 2 with renal complications ([place]) [C12.75] 07/13/2013  . Cardiorenal syndrome with renal failure [I13.10, N19] 04/09/2013  . Hypokalemia [E87.6] 04/07/2013  . Acute respiratory failure ([place]) [J96.00] 03/10/2013  . Gout [M10.9]   . Iron deficiency anemia [D50.9] 12/14/2012  . GERD (gastroesophageal reflux disease) [K21.9]   . Atrial flutter by electrocardiogram ([place]) [I48.92]   . Physical deconditioning [R53.81] 11/30/2012  . COPD (chronic obstructive pulmonary disease) ([place]) [J44.9]   . History of ventricular tachycardia [Z86.79] 09/17/2012  . Hypertensive heart disease [I11.9]     Total Time spent with patient: 45 minutes  Subjective:   Caleb Bauer is a 66 y.o. male patient admitted  with acute respiratory failure. Patient is being treated for respiratory and cardiac failure . See medical notes for medical co morbidities  HPI:   Consult requested for mental capacity as he is having difficulty understanding   his medical condition . Per cardiology hospice is only option however patient does not want hospice, is not taking any placed for skilled nursing facility either. He has stated that his niece Caleb Bauer can help him with skilled nursing facility placement.  On evaluation patient not fully oriented to month and year. Did have difficulty understanding the questions . Limited comprehension.  He talked about vudoo (says stay away from that). Did get somewhat upset and remained non cooperative in regard to assessment.  Mood : upset, mildly agitated Did not endorse hallucinations , but has talked about Vudoo and possession.  Insight remains low and judjement is poor  Past Psychiatric History: denies  Risk to Self: Is patient at risk for suicide?: No Risk to Others:   Prior Inpatient Therapy:   Prior Outpatient Therapy:    Past Medical History:  Past Medical History:  Diagnosis Date  . Acute renal failure ([place]) 11/23/2012  . Arthritis   . Atrial flutter by electrocardiogram ([place]) 11/2012  . Cardiomyopathy, dilated ([place])   . CHF (congestive heart failure) ([place])   . Chronic kidney disease   . Colon polyps   . COPD (chronic obstructive pulmonary disease) ([place])   . Diabetes mellitus without complication ([place])   . Encephalopathy acute 03/14/2013  . Enteritis due to Clostridium difficile 03/12/2014  . GERD (gastroesophageal reflux disease)    appt 5/17  . Gout   . Gout flare 09/12/2013  . HCAP (healthcare-associated pneumonia) 03/24/2013   HCAP 03/09/13 >tx w/ abx  cXR 03/23/13 >improved but persistent bibasilar consolidation>cxr >   . Herpes    BUTT AND BACK- 9/23 HEALED  . Herpes zoster 04/18/2013  . Hx of colonic polyps    Remote hx 08/2015 - 5 mm adenoma recall 2022   .  Hypertension   . Incarcerated incisional hernia 05/30/2015  . Partial small bowel obstruction s/p lap adhesiolysis 05/30/2015 05/30/2015  . Recurrent ventral hernia with incarceration s/p lap repair with mesh 05/30/2015 05/30/2015  . S/P laparoscopic hernia repair 05/30/2015  . Sepsis due to undetermined organism with acute respiratory failure ([place]) 03/10/2013  . Sleep apnea    never used cpap but has one  . Small bowel obstruction ([place]) 10/2012  . UTI (urinary tract infection) 09/15/2013    Past Surgical History:  Procedure Laterality Date  . COLONOSCOPY WITH PROPOFOL N/A 08/06/2015   Procedure: COLONOSCOPY WITH PROPOFOL;  Surgeon: Gatha Mayer, MD;  Location: [facility];  Service: Endoscopy;  Laterality: N/A;  . HERNIA REPAIR     x 3  . INSERTION OF MESH N/A 05/30/2015   Procedure: INSERTION OF MESH;  Surgeon: Michael Boston, MD;  Location: [facility];  Service: General;  Laterality: N/A;  . LAPAROSCOPIC INCISIONAL / UMBILICAL / [place] N/A 05/30/2015   Procedure: LYSIS OF ADHESION;  Surgeon: Michael Boston, MD;  Location: [facility];  Service: General;  Laterality: N/A;  . VENTRAL HERNIA REPAIR N/A 05/30/2015   Procedure: LAPAROSCOPIC VENTRAL HERNIA REPAIR WITH MESH ;  Surgeon: Michael Boston, MD;  Location: [facility];  Service: General;  Laterality: N/A;   Family History:  Family History  Problem Relation Age of Onset  . Diabetes Brother   . Alzheimer's disease Mother   . Cancer Neg Hx     Social History:  History  Alcohol Use No    Comment: 2015 last alcohol"     History  Drug Use No    Social History   Social History  . Marital status: Single    Spouse name: N/A  . Number of children: 0  . Years of education: N/A   Occupational History  . retired    Social History Main Topics  . Smoking status: Former Smoker    Packs/day: 1.00    Years: 30.00    Types: Cigarettes    Quit date: 09/28/2012  . Smokeless tobacco: Never Used  . Alcohol use No      Comment: 2015 last alcohol"  . Drug use: No  . Sexual activity: No   Other Topics Concern  . None   Social History Narrative  . None   Additional Social History:    Allergies:   Allergies  Allergen Reactions  . Other Nausea And Vomiting    Patient stated drug is "kerein"? Formulation, dose, indication?    Labs:  Results for orders placed or performed during the hospital encounter of 06/27/16 (from the past 48 hour(s))  Glucose, capillary     Status: None   Collection Time: 07/11/16 12:13 PM  Result Value Ref Range   Glucose-Capillary 99 65 - 99 mg/dL  Glucose, capillary     Status: Abnormal   Collection Time: 07/11/16  4:33 PM  Result Value Ref Range   Glucose-Capillary 109 (H) 65 - 99 mg/dL   Comment 1 Notify RN    Comment 2 Document in Chart   Glucose, capillary     Status: Abnormal   Collection Time: 07/11/16 10:29 PM  Result Value  Ref Range   Glucose-Capillary 140 (H) 65 - 99 mg/dL  CBC     Status: Abnormal   Collection Time: 07/12/16  3:45 AM  Result Value Ref Range   WBC 13.0 (H) 4.0 - 10.5 K/uL   RBC 4.74 4.22 - 5.81 MIL/uL   Hemoglobin 11.6 (L) 13.0 - 17.0 g/dL   HCT 36.4 (L) 39.0 - 52.0 %   MCV 76.8 (L) 78.0 - 100.0 fL   MCH 24.5 (L) 26.0 - 34.0 pg   MCHC 31.9 30.0 - 36.0 g/dL   RDW 20.2 (H) 11.5 - 15.5 %   Platelets 86 (L) 150 - 400 K/uL    Comment: REPEATED TO VERIFY CONSISTENT WITH PREVIOUS RESULT   Renal function panel     Status: Abnormal   Collection Time: 07/12/16  3:45 AM  Result Value Ref Range   Sodium 138 135 - 145 mmol/L   Potassium 4.1 3.5 - 5.1 mmol/L   Chloride 90 (L) 101 - 111 mmol/L   CO2 32 22 - 32 mmol/L   Glucose, Bld 107 (H) 65 - 99 mg/dL   BUN 112 (H) 6 - 20 mg/dL   Creatinine, Ser 3.78 (H) 0.61 - 1.24 mg/dL   Calcium 8.6 (L) 8.9 - 10.3 mg/dL   Phosphorus 7.4 (H) 2.5 - 4.6 mg/dL   Albumin 2.0 (L) 3.5 - 5.0 g/dL   GFR calc non Af Amer 15 (L) >60 mL/min   GFR calc Af Amer 18 (L) >60 mL/min    Comment: (NOTE) The eGFR has  been calculated using the CKD EPI equation. This calculation has not been validated in all clinical situations. eGFR's persistently <60 mL/min signify possible Chronic Kidney Disease.    Anion gap 16 (H) 5 - 15  Magnesium     Status: Abnormal   Collection Time: 07/12/16  3:45 AM  Result Value Ref Range   Magnesium 2.5 (H) 1.7 - 2.4 mg/dL  .Cooxemetry Panel (carboxy, met, total hgb, O2 sat)     Status: Abnormal   Collection Time: 07/12/16  3:50 AM  Result Value Ref Range   Total hemoglobin 11.8 (L) 12.0 - 16.0 g/dL   O2 Saturation 55.1 %   Carboxyhemoglobin 2.1 (H) 0.5 - 1.5 %   Methemoglobin 1.0 0.0 - 1.5 %  Glucose, capillary     Status: Abnormal   Collection Time: 07/12/16  7:26 AM  Result Value Ref Range   Glucose-Capillary 102 (H) 65 - 99 mg/dL   Comment 1 Notify RN    Comment 2 Document in Chart   Glucose, capillary     Status: Abnormal   Collection Time: 07/12/16 12:03 PM  Result Value Ref Range   Glucose-Capillary 101 (H) 65 - 99 mg/dL   Comment 1 Notify RN    Comment 2 Document in Chart   Glucose, capillary     Status: None   Collection Time: 07/12/16  5:07 PM  Result Value Ref Range   Glucose-Capillary 93 65 - 99 mg/dL   Comment 1 Notify RN    Comment 2 Document in Chart   Glucose, capillary     Status: None   Collection Time: 07/12/16  9:00 PM  Result Value Ref Range   Glucose-Capillary 80 65 - 99 mg/dL   Comment 1 Notify RN    Comment 2 Document in Chart   Renal function panel     Status: Abnormal   Collection Time: 07/13/16  4:25 AM  Result Value Ref Range   Sodium 134 (  L) 135 - 145 mmol/L   Potassium 4.1 3.5 - 5.1 mmol/L   Chloride 90 (L) 101 - 111 mmol/L   CO2 30 22 - 32 mmol/L   Glucose, Bld 98 65 - 99 mg/dL   BUN 114 (H) 6 - 20 mg/dL   Creatinine, Ser 4.48 (H) 0.61 - 1.24 mg/dL   Calcium 8.3 (L) 8.9 - 10.3 mg/dL   Phosphorus 6.8 (H) 2.5 - 4.6 mg/dL   Albumin 2.0 (L) 3.5 - 5.0 g/dL   GFR calc non Af Amer 13 (L) >60 mL/min   GFR calc Af Amer 15 (L)  >60 mL/min    Comment: (NOTE) The eGFR has been calculated using the CKD EPI equation. This calculation has not been validated in all clinical situations. eGFR's persistently <60 mL/min signify possible Chronic Kidney Disease.    Anion gap 14 5 - 15  CBC     Status: Abnormal   Collection Time: 07/13/16  4:35 AM  Result Value Ref Range   WBC 10.6 (H) 4.0 - 10.5 K/uL   RBC 4.72 4.22 - 5.81 MIL/uL   Hemoglobin 11.6 (L) 13.0 - 17.0 g/dL   HCT 35.5 (L) 39.0 - 52.0 %   MCV 75.2 (L) 78.0 - 100.0 fL   MCH 24.6 (L) 26.0 - 34.0 pg   MCHC 32.7 30.0 - 36.0 g/dL   RDW 20.4 (H) 11.5 - 15.5 %   Platelets 94 (L) 150 - 400 K/uL    Comment: CONSISTENT WITH PREVIOUS RESULT  Magnesium     Status: Abnormal   Collection Time: 07/13/16  4:35 AM  Result Value Ref Range   Magnesium 2.5 (H) 1.7 - 2.4 mg/dL  .Cooxemetry Panel (carboxy, met, total hgb, O2 sat)     Status: Abnormal   Collection Time: 07/13/16  4:50 AM  Result Value Ref Range   Total hemoglobin 12.2 12.0 - 16.0 g/dL   O2 Saturation 72.3 %   Carboxyhemoglobin 2.4 (H) 0.5 - 1.5 %   Methemoglobin 0.7 0.0 - 1.5 %  Glucose, capillary     Status: None   Collection Time: 07/13/16  7:58 AM  Result Value Ref Range   Glucose-Capillary 89 65 - 99 mg/dL   Comment 1 Notify RN    Comment 2 Document in Chart     Current Facility-Administered Medications  Medication Dose Route Frequency Provider Last Rate Last Dose  . 0.9 %  sodium chloride infusion  250 mL Intravenous PRN Radene Gunning, NP      . acetaminophen (TYLENOL) tablet 650 mg  650 mg Oral Q4H PRN Radene Gunning, NP   650 mg at 07/12/16 1850  . albuterol (PROVENTIL) (2.5 MG/3ML) 0.083% nebulizer solution 2.5 mg  2.5 mg Nebulization Q6H PRN Radene Gunning, NP      . alum & mag hydroxide-simeth (MAALOX/MYLANTA) 200-200-20 MG/5ML suspension 15 mL  15 mL Oral Q4H PRN Lavina Hamman, MD   15 mL at 07/01/16 2026  . amiodarone (PACERONE) tablet 200 mg  200 mg Oral Daily Larey Dresser, MD   200  mg at 07/13/16 1191  . apixaban (ELIQUIS) tablet 5 mg  5 mg Oral BID Larey Dresser, MD   5 mg at 07/13/16 1000  . bisacodyl (DULCOLAX) suppository 10 mg  10 mg Rectal Daily PRN Opyd, Ilene Qua, MD      . cefTRIAXone (ROCEPHIN) 1 g in dextrose 5 % 50 mL IVPB  1 g Intravenous Q24H Rai, Ripudeep K, MD 100 mL/hr  at 07/12/16 1000 1 g at 07/12/16 1000  . colchicine tablet 0.3 mg  0.3 mg Oral Daily Shirley Friar, PA-C   0.3 mg at 07/13/16 4627  . guaiFENesin (MUCINEX) 12 hr tablet 600 mg  600 mg Oral BID Lavina Hamman, MD   600 mg at 07/13/16 1000  . guaiFENesin-dextromethorphan (ROBITUSSIN DM) 100-10 MG/5ML syrup 5 mL  5 mL Oral Q4H PRN Elwin Mocha, MD   5 mL at 06/27/16 2138  . insulin aspart (novoLOG) injection 0-9 Units  0-9 Units Subcutaneous TID WC Radene Gunning, NP   2 Units at 07/07/16 1727  . methimazole (TAPAZOLE) tablet 10 mg  10 mg Oral TID Deboraha Sprang, MD   10 mg at 07/13/16 0350  . ondansetron (ZOFRAN) injection 4 mg  4 mg Intravenous Q6H PRN Radene Gunning, NP   4 mg at 07/01/16 2053  . pantoprazole (PROTONIX) EC tablet 40 mg  40 mg Oral Q0600 Rama, Venetia Maxon, MD   40 mg at 07/13/16 0658  . polyethylene glycol (MIRALAX / GLYCOLAX) packet 17 g  17 g Oral Daily Opyd, Ilene Qua, MD   17 g at 07/13/16 0924  . sodium chloride flush (NS) 0.9 % injection 10-40 mL  10-40 mL Intracatheter Q12H Lavina Hamman, MD   10 mL at 07/11/16 2200  . sodium chloride flush (NS) 0.9 % injection 10-40 mL  10-40 mL Intracatheter PRN Lavina Hamman, MD   10 mL at 07/12/16 2031  . sodium chloride flush (NS) 0.9 % injection 3 mL  3 mL Intravenous Q12H Radene Gunning, NP   3 mL at 07/13/16 1000  . sodium chloride flush (NS) 0.9 % injection 3 mL  3 mL Intravenous PRN Radene Gunning, NP   3 mL at 07/12/16 2237  . torsemide (DEMADEX) tablet 40 mg  40 mg Oral Daily Shirley Friar, PA-C   40 mg at 07/13/16 0938      Psychiatric Specialty Exam: Physical Exam  ROS  Blood pressure  107/75, pulse 88, temperature 97.9 F (36.6 C), temperature source Oral, resp. rate 18, height 6' (1.829 m), weight 85.4 kg (188 lb 3.2 oz), SpO2 100 %.Body mass index is 25.52 kg/m.  General Appearance: Casual  Eye Contact:  Fair  Speech:  Normal Rate  Volume:  Decreased  Mood:  somewhat non cooperative  Affect:  Congruent  Thought Process:  Disorganized  Orientation:  Other:  day and person  Thought Content:  Rumination and . not fully logical  Suicidal Thoughts:  No  Homicidal Thoughts:  No  Memory:  Immediate;   Fair Recent;   limited  Judgement:  Other:  limited  Insight:  Shallow  Psychomotor Activity:  Decreased  Concentration:  Concentration: Fair and Attention Span: Fair  Recall:  Poor  Fund of Knowledge:  Fair  Language:  Fair  Akathisia:  Negative  Handed:  Right  AIMS (if indicated):     Assets:  Others:  limited  ADL's:  Impaired  Cognition:  Impaired,  Mild  Sleep:        Treatment Plan Summary: Daily contact with patient to assess and evaluate symptoms and progress in treatment, Medication management and Plan as follows Chart reviewed and with the assessment of his mental status. It does not appear he is fully understanding his medical condition and its consequences if not treated . He has talked about Vudoo and appears guarded. He does not have the full capacity to  decide about his medical condition and best choice treatment.    Disposition: Supportive therapy provided about ongoing stressors.  Merian Capron, MD 07/13/2016 11:40 AM

## 2016-07-13 NOTE — Progress Notes (Addendum)
CSW called pt niece, [name], to discuss medical team recommendation for SNF.  [name] states she is patients HCPOA- states she will bring in copy of this paperwork.  [name] is agreeable to pt placement in SNF- prefers [facility] [employer] [facility]  CSW will continue to follow  Jorge Ny, [facility] Social Worker [phone]

## 2016-07-14 DIAGNOSIS — J449 Chronic obstructive pulmonary disease, unspecified: Secondary | ICD-10-CM

## 2016-07-14 DIAGNOSIS — I1310 Hypertensive heart and chronic kidney disease without heart failure, with stage 1 through stage 4 chronic kidney disease, or unspecified chronic kidney disease: Secondary | ICD-10-CM

## 2016-07-14 DIAGNOSIS — N19 Unspecified kidney failure: Secondary | ICD-10-CM

## 2016-07-14 LAB — GLUCOSE, CAPILLARY
GLUCOSE-CAPILLARY: 76 mg/dL (ref 65–99)
Glucose-Capillary: 84 mg/dL (ref 65–99)

## 2016-07-14 LAB — CBC
HEMATOCRIT: 34.7 % — AB (ref 39.0–52.0)
Hemoglobin: 11.2 g/dL — ABNORMAL LOW (ref 13.0–17.0)
MCH: 24.1 pg — AB (ref 26.0–34.0)
MCHC: 32.3 g/dL (ref 30.0–36.0)
MCV: 74.6 fL — AB (ref 78.0–100.0)
PLATELETS: 92 10*3/uL — AB (ref 150–400)
RBC: 4.65 MIL/uL (ref 4.22–5.81)
RDW: 20.4 % — AB (ref 11.5–15.5)
WBC: 7.8 10*3/uL (ref 4.0–10.5)

## 2016-07-14 LAB — MAGNESIUM: Magnesium: 2.4 mg/dL (ref 1.7–2.4)

## 2016-07-14 LAB — RENAL FUNCTION PANEL
ALBUMIN: 2 g/dL — AB (ref 3.5–5.0)
ANION GAP: 18 — AB (ref 5–15)
BUN: 126 mg/dL — AB (ref 6–20)
CALCIUM: 8.3 mg/dL — AB (ref 8.9–10.3)
CO2: 29 mmol/L (ref 22–32)
Chloride: 88 mmol/L — ABNORMAL LOW (ref 101–111)
Creatinine, Ser: 4.59 mg/dL — ABNORMAL HIGH (ref 0.61–1.24)
GFR calc Af Amer: 14 mL/min — ABNORMAL LOW (ref >60)
GFR, EST NON AFRICAN AMERICAN: 12 mL/min — AB (ref >60)
Glucose, Bld: 81 mg/dL (ref 65–99)
PHOSPHORUS: 7 mg/dL — AB (ref 2.5–4.6)
Potassium: 4 mmol/L (ref 3.5–5.1)
SODIUM: 135 mmol/L (ref 135–145)

## 2016-07-14 LAB — COOXEMETRY PANEL
Carboxyhemoglobin: 1.7 % — ABNORMAL HIGH (ref 0.5–1.5)
Methemoglobin: 1.1 % (ref 0.0–1.5)
O2 Saturation: 51 %
Total hemoglobin: 11.5 g/dL — ABNORMAL LOW (ref 12.0–16.0)

## 2016-07-14 MED ORDER — INSULIN ASPART 100 UNIT/ML ~~LOC~~ SOLN: 0.0000 [IU] | Freq: Three times a day (TID) | SUBCUTANEOUS | 11 refills | Status: AC

## 2016-07-14 MED ORDER — POLYETHYLENE GLYCOL 3350 17 G PO PACK: 17.0000 g | PACK | Freq: Every day | ORAL | 0 refills | Status: AC

## 2016-07-14 MED ORDER — COLCHICINE 0.6 MG PO TABS: 0.3000 mg | ORAL_TABLET | Freq: Every day | ORAL | Status: AC

## 2016-07-14 MED ORDER — GUAIFENESIN-DM 100-10 MG/5ML PO SYRP: 5.0000 mL | ORAL_SOLUTION | ORAL | 0 refills | Status: AC | PRN

## 2016-07-14 MED ORDER — TRAMADOL HCL 50 MG PO TABS
50.0000 mg | ORAL_TABLET | Freq: Three times a day (TID) | ORAL | 0 refills | Status: AC | PRN
Start: 2016-07-14 — End: ?

## 2016-07-14 MED ORDER — CEPHALEXIN 500 MG PO CAPS: 500.0000 mg | ORAL_CAPSULE | Freq: Two times a day (BID) | ORAL | 0 refills | Status: DC

## 2016-07-14 MED ORDER — METHIMAZOLE 10 MG PO TABS: 10.0000 mg | ORAL_TABLET | Freq: Three times a day (TID) | ORAL | Status: AC

## 2016-07-14 MED ORDER — TORSEMIDE 20 MG PO TABS: 40.0000 mg | ORAL_TABLET | Freq: Every day | ORAL | 0 refills | Status: AC

## 2016-07-14 MED ORDER — PANTOPRAZOLE SODIUM 40 MG PO TBEC: 40.0000 mg | DELAYED_RELEASE_TABLET | Freq: Every day | ORAL | Status: AC

## 2016-07-14 NOTE — Progress Notes (Signed)
Patient ID: Caleb Bauer, male   DOB: 03-Jun-1950, 66 y.o.   MRN: [id number]     Advanced Heart Failure Rounding Note   Subjective:    Mr Osmundson is a 66 year old with a history of DMII, HTN, HF, COPD, former ETOH abuse (stopped drinking 2014), noncompliance, and systolic heart failure admitted with volume overload and AKI in the setting on medication noncompliance.    Yesterday diuretics held and he was given 500 cc NS. Weight up 1 pound. Creatinine trending up 4.48>4.59. BUN 114>126.  Feeling better. Denies SOB/CP.   Objective:   Weight Range: 189 lb 6.4 oz (85.9 kg) Body mass index is 25.69 kg/m.   Vital Signs:   Temp:  [97.4 F (36.3 C)-98.4 F (36.9 C)] 98 F (36.7 C) (05/14 0609) Pulse Rate:  [77-89] 85 (05/14 0609) Resp:  [18] 18 (05/14 0609) BP: (103-113)/(69-75) 113/69 (05/14 0609) SpO2:  [94 %-100 %] 98 % (05/14 0609) Weight:  [189 lb 6.4 oz (85.9 kg)] 189 lb 6.4 oz (85.9 kg) (05/14 0609) Last BM Date: 07/13/16  Weight change: Filed Weights   07/12/16 0448 07/13/16 0539 07/14/16 0609  Weight: 185 lb 12.8 oz (84.3 kg) 188 lb 3.2 oz (85.4 kg) 189 lb 6.4 oz (85.9 kg)    Intake/Output:   Intake/Output Summary (Last 24 hours) at 07/14/16 0752 Last data filed at 07/14/16 3419  Gross per 24 hour  Intake              693 ml  Output              800 ml  Net             -107 ml     Physical Exam: General:  Well appearing. No resp difficulty. In bed HEENT: normal Neck: supple. JVP 7-8. Carotids 2+ bilat; no bruits. No lymphadenopathy or thryomegaly appreciated. Cor: PMI nondisplaced. Regular rate & rhythm. No rubs, gallops or murmurs. Lungs: clear Abdomen: soft, nontender, nondistended. No hepatosplenomegaly. No bruits or masses. Good bowel sounds. Extremities: no cyanosis, clubbing, rash, edema Neuro: alert & orientedx3, cranial nerves grossly intact. moves all 4 extremities w/o difficulty. Affect pleasant Skin: LLE partial thickness wound posterior aspect  0.5x0.2 cm. RLE anterior aspect .2x.2 cm  Telemetry: NSR 80s    Labs: CBC  Recent Labs  07/13/16 0435 07/14/16 0453  WBC 10.6* 7.8  HGB 11.6* 11.2*  HCT 35.5* 34.7*  MCV 75.2* 74.6*  PLT 94* 92*   Basic Metabolic Panel  Recent Labs  07/13/16 0425 07/13/16 0435 07/14/16 0453  NA 134*  --  135  K 4.1  --  4.0  CL 90*  --  88*  CO2 30  --  29  GLUCOSE 98  --  81  BUN 114*  --  126*  CREATININE 4.48*  --  4.59*  CALCIUM 8.3*  --  8.3*  MG  --  2.5* 2.4  PHOS 6.8*  --  7.0*   Liver Function Tests  Recent Labs  07/13/16 0425 07/14/16 0453  ALBUMIN 2.0* 2.0*   No results for input(s): LIPASE, AMYLASE in the last 72 hours. Cardiac Enzymes No results for input(s): CKTOTAL, CKMB, CKMBINDEX, TROPONINI in the last 72 hours.  BNP: BNP (last 3 results)  Recent Labs  06/27/16 0349  BNP >4,500.0*    ProBNP (last 3 results) No results for input(s): PROBNP in the last 8760 hours.   D-Dimer No results for input(s): DDIMER in the last 72 hours. Hemoglobin  A1C No results for input(s): HGBA1C in the last 72 hours. Fasting Lipid Panel No results for input(s): CHOL, HDL, LDLCALC, TRIG, CHOLHDL, LDLDIRECT in the last 72 hours. Thyroid Function Tests No results for input(s): TSH, T4TOTAL, T3FREE, THYROIDAB in the last 72 hours.  Invalid input(s): FREET3  Other results:     Imaging/Studies:  No results found.    Medications:     Scheduled Medications: . amiodarone  200 mg Oral Daily  . apixaban  5 mg Oral BID  . colchicine  0.3 mg Oral Daily  . guaiFENesin  600 mg Oral BID  . insulin aspart  0-9 Units Subcutaneous TID WC  . methimazole  10 mg Oral TID  . pantoprazole  40 mg Oral Q0600  . polyethylene glycol  17 g Oral Daily  . sodium chloride flush  3 mL Intravenous Q12H  . torsemide  40 mg Oral Daily    Infusions: . sodium chloride    . cefTRIAXone (ROCEPHIN)  IV Stopped (07/13/16 1324)    PRN Medications: sodium chloride, acetaminophen,  albuterol, alum & mag hydroxide-simeth, bisacodyl, guaiFENesin-dextromethorphan, ondansetron (ZOFRAN) IV, sodium chloride flush, sodium chloride flush   Assessment/Plan   1. Acute on chronic systolic CHF: Nonischemic cardiomyopathy.  Echo 4/18 with EF 20%, moderately dilated RV, moderate-severe MR and TR. Low output HF with creatinine up considerably.   Volume status ok. Hold torsemide and start tomorrow 2. Hyperthyroidism: New diagnosis. ? Related to amio.   - Continue methimazole as treatment of amiodarone-induced hyperthyroidism (now off prednisone). No change. 3. NSVT: NSVT and PVCs on milrinone.  Per EP, no definite benefit at this point to stopping amiodarone and may make hyperthyroidism worse by increasing conversion of T4=>T3.  Therefore, have elected not to use mexiletine and to keep him on amiodarone.  - Hyperthyroidism treated with methimazole. Now off prednisone. Per primary.   4. Atrial fibrillation:  Paroxysmal.  - Maintaining NSR. Continue amio + eliquis.    5. Thrombocytopenia:  Doubt HIT as this preceded admission, looking back he has had thrombocytopenia since 3/17. ? Cirrhosis but CT in 2014 comments on normal liver. Platelets normal in 2016 but 98k in 3/17 - RUQ [place] --> no evidence of cirrhosis.  - PLTs 92. Continue eliquis.     6. AKI on CKD stage 3:  - Creatinine/BUN continue to rise. - He is NOT an HD candidate. Renal recommends palliative care vs hospice.  7. Gout flare - Continue colchicine 0.3 mg daily (reduced dose with CKD) 8. UTI - Antibiotics per primary team.   Renal function continues to worsen. Needs Hospice.   Disposition- plan for SNF Length of Stay: [place], NP  07/14/2016, 7:52 AM  Advanced Heart Failure Team Pager [phone] (M-F; 7a - 4p)  Please contact [facility] for night-coverage after hours (4p -7a ) and weekends on [email or web address]  Patient seen with NP, agree with the above note.    BUN/creatinine continue to rise off milrinone with  co-ox 51%.  As noted above, not good candidate for home inotrope (no end point, not candidate for LVAD or transplant).  He does not appear volume overloaded today, would hold torsemide (eventually will need to restart).    He was seen by psychiatry, deemed not competent for medical decision-making.    Ideally he would be under hospice care.  At this point, he will be DNR with plan for SNF.   Loralie Champagne 07/14/2016 8:36 AM

## 2016-07-14 NOTE — Discharge Summary (Signed)
Physician Discharge Summary   Patient ID: Caleb Bauer MRN: [id number] DOB/AGE: [birth year] 66 y.o.  Admit date: 06/27/2016 Discharge date: 07/14/2016  Primary Care Physician:  [name], MD  Discharge Diagnoses:    . COPD (chronic obstructive pulmonary disease) ([place]) . Chronic gouty arthritis . GERD (gastroesophageal reflux disease) . Hypertensive heart disease . Iron deficiency anemia . Acute renal failure superimposed on stage 3 chronic kidney disease ([place]) . Sleep apnea . Chest pain . Acute on chronic combined systolic (congestive) and diastolic (congestive) heart failure ([place]) . Atrial flutter by electrocardiogram ([place]) . Bilateral leg edema . Pressure injury of skin . AKI (acute kidney injury) ([place]) . Amiodarone-induced thyroiditis . 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 Obesity (BMI 30.0-34.9) . Hyponatremia . Anasarca . Hypokalemia . Gout . DM (diabetes mellitus), type 2 with renal complications ([place]) . Cardiorenal syndrome with renal failure . Acute respiratory failure ([place]) . Physical deconditioning   Consults:  Cardiology, heart failure team Palliative care medicine Psychiatry    Recommendations for Outpatient Follow-up:  1. Please have palliative care evaluate patient at skilled nursing facility, he declined hospice at this time  2. Please repeat CBC/BMET at next visit 3. Start Demadex on 5/15 4. Wound care to the lower extremities please change the dressings to bilat legs daily as follows: Apply xeroform gauze to open areas, then cover with ABD pads, then kerlex, beginning from behind toes to below knees.  Then apply ace wrap in the same manner.   DIET: Heart healthy diet    Allergies:   Allergies  Allergen Reactions  . Other Nausea And Vomiting    Patient stated drug is "kerein"? Formulation, dose, indication?     DISCHARGE MEDICATIONS: Current Discharge Medication List    START taking these medications    Details  cephALEXin (KEFLEX) 500 MG capsule Take 1 capsule (500 mg total) by mouth 2 (two) times daily. X 3 days Qty: 12 capsule, Refills: 0    guaiFENesin-dextromethorphan (ROBITUSSIN DM) 100-10 MG/5ML syrup Take 5 mLs by mouth every 4 (four) hours as needed for cough. Qty: 118 mL, Refills: 0    insulin aspart (NOVOLOG) 100 UNIT/ML injection Inject 0-9 Units into the skin 3 (three) times daily with meals. Sliding scale CBG 70 - 120: 0 units CBG 121 - 150: 1 unit,  CBG 151 - 200: 2 units,  CBG 201 - 250: 3 units,  CBG 251 - 300: 5 units,  CBG 301 - 350: 7 units,  CBG 351 - 400: 9 units   CBG > 400: 9 units and notify your MD Qty: 10 mL, Refills: 11    methimazole (TAPAZOLE) 10 MG tablet Take 1 tablet (10 mg total) by mouth 3 (three) times daily.    pantoprazole (PROTONIX) 40 MG tablet Take 1 tablet (40 mg total) by mouth daily at 6 (six) AM.    polyethylene glycol (MIRALAX / GLYCOLAX) packet Take 17 g by mouth daily. Qty: 14 each, Refills: 0      CONTINUE these medications which have CHANGED   Details  colchicine 0.6 MG tablet Take 0.5 tablets (0.3 mg total) by mouth daily.    torsemide (DEMADEX) 20 MG tablet Take 2 tablets (40 mg total) by mouth daily. Refills: 0    traMADol (ULTRAM) 50 MG tablet Take 1 tablet (50 mg total) by mouth every 8 (eight) hours as needed for severe pain. Qty: 10 tablet, Refills: 0      CONTINUE these medications which have NOT CHANGED  Details  albuterol (PROVENTIL) (2.5 MG/3ML) 0.083% nebulizer solution Take 2.5 mg by nebulization every 6 (six) hours as needed for wheezing or shortness of breath.    amiodarone (PACERONE) 200 MG tablet Take 1 tablet (200 mg total) by mouth daily. Qty: 90 tablet, Refills: 3    apixaban (ELIQUIS) 5 MG TABS tablet Take 5 mg by mouth 2 (two) times daily.    hydrALAZINE (APRESOLINE) 10 MG tablet Take 10 mg by mouth 3 (three) times daily.      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 In the ED, he was given 80 mg of Lasix and 324 mg of aspirin and referred to [facility] for further care. BNP greater than 4500 on admission. 2-D echo showed an EF of 20% with moderate to severe MR and a moderately dilated RV. Not felt to be a candidate for ICD or pacemaker due to history of noncompliance and multiple medical comorbidities. PICC line placed 06/30/16. Cardiology, nephrology following.   Hospital Course:   Acute respiratory failure secondary to Acute on chronic combined systolic (congestive) and diastolic (congestive) heart failure (HCC)/Bilateral leg edema/Anasarca - Patient was admitted to stepdown unit, cardiology and nephrology were consulted. - 2-D echo on 4/18 showed EF of 66% with moderately dilated RV, moderate to severe MR, TR, - Patient was Initially placed on IV high-dose Lasix, metolazone and eventually required IV milrinone drip. Lasix was discontinued by cardiology due to low CVP and subsequently off diuretics. - Milrinone drip was weaned off on 5/10. Per cardiology, patient is not a candidate for advanced therapies due to strong history  of noncompliance - Per nephrology, he will not be a good HD candidate, overall poor prognosis, also agreed with palliative approach. - Overall negative balance of 14.6 L, weight down from 227 lbs on admission to 189 lbs at discharge.  - Palliative care medicine was consulted for goals of care, patient was seen by Dr. Domingo Cocking. Per cardiology, nephrology and palliative care, hospice is the only option however patient does not feel ready for hospice and wants to try skilled nursing facility first. He agrees with DNR status.  - Psychiatry was also consulted for capacity evaluation and patient per psychiatry does not have capacity to make his medical decisions. His niece, [name] has healthcare power for attorney. - Per cardiology, volume status currently okay, hold torsemide today and start on 1/61   Acute metabolic encephalopathy secondary to UTI -On 5/11, patient was found to be very confused, UA was positive for UTI.  - UA positive for UTI with leukocytosis.  Urine culture with multiple species, placed on IV Rocephin - Transition to oral Keflex for discharge   Metabolic alkalosis -Secondary to diuretics. Diamox was added by cardiology 07/06/16 but currently discontinued by cardiology.   Hypokalemia - Resolved  Mild hyponatremia -Likely reflective of CHF physiology, resolved   Hypertensive heart disease -BP stable, continue current regimen  Diabetes mellitus without complication (HCC) -Hemoglobin A1c 6.2%. Currently being managed with insulin sensitive SSI   COPD (chronic obstructive pulmonary disease) (HCC) -Continue bronchodilators. Remains stable.  Atrial flutter by electrocardiogram ([place]) - Initially managed with heparin drip, subsequently discontinued secondary to thrombocytopenia.  - Heparin drip was resumed 5/4, currently resumed eliquis  GERD (gastroesophageal reflux disease) - Stable, Continue Protonix   Iron deficiency anemia -Hemoglobin  stable  Chronic gouty arthritis -Continue low-dose colchicine  Sleep apnea -Continue CPAP daily at bedtime.  Chest pain -Troponin was mildly elevated, trend was flat. Currently denies chest pain.  Diabetic ulcers Chronic full thickness wound on the left posterior leg with superimposed cellulitis. Evaluated by wound care nurse with recommendations for wound care as above in the instructions.  AKI (acute kidney injury) (HCC)/stage III CKD/cardiorenal syndrome - Possibly cardiorenal syndrome with poor cardiac output. 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 Patient was treated for prednisone 40 mg for 3 days along with with methimazole.  Chronic thrombocytopenia - Unclear etiology, questionable liver disease.  -  platelet count improving, right upper quadrant ultrasound does not show any cirrhosis.   Obesity/physical deconditioning Body mass index is 27.45 kg/m. PTOT evaluation recommended skilled nursing facility placement   Day of Discharge BP 113/69 (BP Location: Left Arm)   Pulse 85   Temp 98 F (36.7 C) (Oral)   Resp 18   Ht 6' (1.829 m) Comment: per pt previously documented 6' 2: was incorrect  Wt 85.9 kg (189 lb 6.4 oz)   SpO2 98%   BMI 25.69 kg/m   Physical Exam: General: Alert and awake oriented x3 not in any acute distress. HEENT: anicteric sclera, pupils reactive to light and accommodation CVS: S1-S2 clear no murmur rubs or gallops Chest: clear to auscultation bilaterally, no wheezing rales or rhonchi Abdomen: soft nontender, nondistended, normal bowel sounds Extremities: no cyanosis, clubbing, bilateral lower extremity dressing  intact  Neuro: Cranial nerves II-XII intact, no focal neurological deficits   The results of significant diagnostics from this hospitalization (including imaging, microbiology, ancillary and laboratory) are listed below for reference.    LAB RESULTS: Basic Metabolic Panel:  Recent Labs Lab 07/13/16 0425  07/14/16 0453  NA 134*  --  135  K 4.1  --  4.0  CL 90*  --  88*  CO2 30  --  29  GLUCOSE 98  --  81  BUN 114*  --  126*  CREATININE 4.48*  --  4.59*  CALCIUM 8.3*  --  8.3*  MG  --   < > 2.4  PHOS 6.8*  --  7.0*  < > = values in this interval not displayed. Liver Function Tests:  Recent Labs Lab 07/13/16 0425 07/14/16 0453  ALBUMIN 2.0* 2.0*   No results for input(s): LIPASE, AMYLASE in the last 168 hours. No results for input(s): AMMONIA in the last 168 hours. CBC:  Recent Labs Lab 07/13/16 0435 07/14/16 0453  WBC 10.6*  7.8  HGB 11.6* 11.2*  HCT 35.5* 34.7*  MCV 75.2* 74.6*  PLT 94* 92*   Cardiac Enzymes: No results for input(s): CKTOTAL, CKMB, CKMBINDEX, TROPONINI in the last 168 hours. BNP: Invalid input(s): POCBNP CBG:  Recent Labs Lab 07/13/16 2149 07/14/16 0749  GLUCAP 99 84    Significant Diagnostic Studies:  Dg Chest 2 View  Result Date: 06/27/2016 CLINICAL DATA:  66 year old male with history of CHF, diabetes, and hypertension presenting with dyspnea EXAM: CHEST  2 VIEW COMPARISON:  Chest radiograph dated 07/24/2014 FINDINGS: There is moderate cardiomegaly with central vascular prominence compatible with congestive changes. There is no focal consolidation, pleural effusion, or pneumothorax. No acute osseous pathology. IMPRESSION: Moderate cardiomegaly with vascular congestion. No focal consolidation. Electronically Signed   By: Anner Crete M.D.   On: 06/27/2016 04:21   US Renal  Result Date: 06/28/2016 CLINICAL DATA:  Acute kidney injury EXAM: RENAL / URINARY TRACT ULTRASOUND COMPLETE COMPARISON:  03/11/2014 FINDINGS: Right Kidney:  Length: 12 cm. Increased renal cortical echogenicity. No hydronephrosis visualized. 2.2 x 2.2 x 2.1 cm anechoic right interpolar renal mass most consistent with a cyst. Left Kidney: Length: 11.8 cm. Increased renal cortical echogenicity. No hydronephrosis visualized. 2.5 x 2.2 x 2.1 cm anechoic left lower pole renal mass most consistent with a cyst. Bladder: Appears normal for degree of bladder distention. Soft tissue: Small left pleural effusion.  Abdominal ascites. IMPRESSION: 1. Bilateral increased renal cortical echogenicity as can be seen with medical renal disease. 2. Bilateral renal cysts. Electronically Signed   By: Kathreen Devoid   On: 06/28/2016 11:36    2D ECHO: Study Conclusions  - Left ventricle: The cavity size was moderately dilated. Wall   thickness was normal. The estimated ejection fraction was 20%.   Diffuse hypokinesis. Doppler parameters are consistent with both   elevated ventricular end-diastolic filling pressure and elevated   left atrial filling pressure. - Mitral valve: There was moderate to severe regurgitation. Valve   area by pressure half-time: 0.85 cm^2. - Left atrium: The atrium was moderately dilated. - Right ventricle: The cavity size was moderately dilated. - Right atrium: The atrium was moderately dilated. - Atrial septum: No defect or patent foramen ovale was identified. - Tricuspid valve: There was moderate-severe regurgitation. - Pulmonary arteries: PA peak pressure: 37 mm Hg (S).  Disposition and Follow-up: Discharge Instructions    (HEART FAILURE PATIENTS) Call MD:  Anytime you have any of the following symptoms: 1) 3 pound weight gain in 24 hours or 5 pounds in 1 week 2) shortness of breath, with or without a dry hacking cough 3) swelling in the hands, feet or stomach 4) if you have to sleep on extra pillows at night in order to breathe.    Complete by:  As directed    Diet - low sodium heart healthy    Complete by:  As directed    Increase activity  slowly    Complete by:  As directed        DISPOSITION: [facility] Follow up on 07/22/2016.   Specialty:  Cardiology Why:  at 2 pm for post hospital follow up . Please bring all of your medicaitons to your visit. The code for parking is 6001. Enter through [employer] of [place], [employer] parking on your right. Can also park in lower ED lot and enter blue awning.  Contact information: [address]  [phone] [place] [phone]       [name], MD. Schedule an appointment as soon as possible for a visit in 2 week(s).   Specialty:  Family Medicine Contact information: [place] [address] [phone]            Time spent on Discharge: 35 minutes  Signed:   Estill Cotta M.D. [facility] 07/14/2016, 10:11 AM Pager: [phone]

## 2016-07-14 NOTE — Progress Notes (Signed)
Report given to [place] at [facility].

## 2016-07-14 NOTE — Progress Notes (Signed)
Daily Progress Note   Patient Name: Caleb Bauer       Date: 07/14/2016 DOB: Aug 28, 1950  Age: 66 y.o. MRN#: [id number] Attending Physician: Mendel Corning, MD Primary Care Physician: [name], MD Admit Date: 06/27/2016  Reason for Consultation/Follow-up: Establishing goals of care, Hospice Evaluation and Psychosocial/spiritual support  Subjective: Pt is alert and oriented to place but complaining that staff forgot to bring his lunch.  I reminded him that it was only 0930 and he replied, "Sh*t, I get so confused sometimes."  He continues to have no insight into current clinical condition or safety needs.  He was pleasant and cooperative.    He states he feels well denies any complaints.  Tells me I should call his niece if I need anything else.  Length of Stay: 17  Current Medications: Scheduled Meds:  . amiodarone  200 mg Oral Daily  . apixaban  5 mg Oral BID  . colchicine  0.3 mg Oral Daily  . guaiFENesin  600 mg Oral BID  . insulin aspart  0-9 Units Subcutaneous TID WC  . methimazole  10 mg Oral TID  . pantoprazole  40 mg Oral Q0600  . polyethylene glycol  17 g Oral Daily  . sodium chloride flush  3 mL Intravenous Q12H    Continuous Infusions: . sodium chloride    . cefTRIAXone (ROCEPHIN)  IV Stopped (07/13/16 1324)    PRN Meds: sodium chloride, acetaminophen, albuterol, alum & mag hydroxide-simeth, bisacodyl, guaiFENesin-dextromethorphan, ondansetron (ZOFRAN) IV, sodium chloride flush, sodium chloride flush  Physical Exam  Constitutional: He appears well-developed and well-nourished.  Eyes: EOM are normal. Pupils are equal, round, and reactive to light.  Neck: Normal range of motion.  Pulmonary/Chest: Effort normal.  Musculoskeletal: Normal range of motion.    Neurological: He is alert.  Oriented t self, that he is in the hospital  Skin: Skin is warm and dry.  Psychiatric:  Flight of ideas Impaired judgement and very poor insight   Nursing note and vitals reviewed.           Vital Signs: BP 113/69 (BP Location: Left Arm)   Pulse 85   Temp 98 F (36.7 C) (Oral)   Resp 18   Ht 6' (1.829 m) Comment: per pt previously documented 6' 2: was incorrect  Wt 85.9  kg (189 lb 6.4 oz)   SpO2 98%   BMI 25.69 kg/m  SpO2: SpO2: 98 % O2 Device: O2 Device: Not Delivered O2 Flow Rate: O2 Flow Rate (L/min): 1 L/min  Intake/output summary:   Intake/Output Summary (Last 24 hours) at 07/14/16 0947 Last data filed at 07/14/16 8469  Gross per 24 hour  Intake              570 ml  Output              600 ml  Net              -30 ml   LBM: Last BM Date: 07/13/16 Baseline Weight: Weight: 103 kg (227 lb) Most recent weight: Weight: 85.9 kg (189 lb 6.4 oz)       Palliative Assessment/Data:    Flowsheet Rows     Most Recent Value  Intake Tab  Referral Department  Hospitalist  Unit at Time of Referral  Intermediate Care Unit  Palliative Care Primary Diagnosis  Cardiac  Date Notified  07/09/16  Palliative Care Type  New Palliative care  Reason for referral  Clarify Goals of Care  Date of Admission  06/27/16  # of days IP prior to Palliative referral  12  Clinical Assessment  Psychosocial & Spiritual Assessment  Palliative Care Outcomes      Patient Active Problem List   Diagnosis Date Noted  . Palliative care by specialist   . Metabolic alkalosis 62/95/2841  . Hyponatremia 07/05/2016  . PAF (paroxysmal atrial fibrillation) ([place])   . Obesity (BMI 30.0-34.9) 07/02/2016  . AKI (acute kidney injury) ([place])   . Ventricular tachycardia, nonsustained ([place])   . Amiodarone-induced thyroiditis   . 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 Pressure injury of skin 06/28/2016  . Chest pain 06/27/2016  . Acute on chronic combined  systolic (congestive) and diastolic (congestive) heart failure ([place]) 06/27/2016  . Bilateral leg edema 06/27/2016  . Sleep apnea   . Anasarca 07/26/2014  . Chronic gouty arthritis 12/21/2013  . Acute renal failure superimposed on stage 3 chronic kidney disease ([place]) 09/12/2013  . DM (diabetes mellitus), type 2 with renal complications ([place]) 32/44/0102  . Cardiorenal syndrome with renal failure 04/09/2013  . Hypokalemia 04/07/2013  . Acute respiratory failure ([place]) 03/10/2013  . Gout   . Iron deficiency anemia 12/14/2012  . GERD (gastroesophageal reflux disease)   . Atrial flutter by electrocardiogram ([place])   . Physical deconditioning 11/30/2012  . COPD (chronic obstructive pulmonary disease) ([place])   . History of ventricular tachycardia 09/17/2012  . 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 In the ED, he was given 80 mg of Lasix and 324 mg of aspirin and referred to [facility] for further care. BNP greater than 4500 on admission. 2-D echo showed an EF of 20% with moderate to severe MR and a moderately dilated RV. Not felt to be a candidate for ICD or pacemaker due to history of noncompliance and multiple medical comorbidities. PICC line placed 06/30/16.  Cardiology, nephrology following. Coox now decreased off milrinn from 69 to 55. His creatinine continues to rise.   Recommendations/Plan:  Attempted to call his niece to discuss again, however no answer and I left voicemail  requesting return call.  She was invested in plan for SNF when I last spoke with her and was opposed to discussion of hospice at that time.  Even with his continued renal decline, I doubt her feelings will change as she reports being told in the past he will not recover, but then he has.  As I do not think that it is likely that family will choose to elect his hospice benefit, best plan is for pt to DC to SNF with palliative consult to reassess pt in SNF after DC. Pt lacks capacity to elect other level of services such as hospice and has no insight or ability to care for himself in his current condition. Chart reviewed and see that CM involved with pt's niece to obtain SNF bed.  During last discussion with niece, she was opposed to considering hospice.  Will continue to discuss this with her as she is willing.  Await call back.  His prognosis is certainly less than 6 months, and he should qualify for hospice services if his family wishes to consider at any point in the future.  Code Status:    Code Status Orders        Start     Ordered   07/10/16 1044  Do not attempt resuscitation (DNR)  Continuous    Question Answer Comment  In the event of cardiac or respiratory ARREST Do not call a "code blue"   In the event of cardiac or respiratory ARREST Do not perform Intubation, CPR, defibrillation or ACLS   In the event of cardiac or respiratory ARREST Use medication by any route, position, wound care, and other measures to relive pain and suffering. May use oxygen, suction and manual treatment of airway obstruction as needed for comfort.      07/10/16 1043    Code Status History    Date Active Date Inactive Code Status Order ID Comments User Context   07/07/2016 10:28 PM 07/10/2016 10:43 AM Full Code [id number]  Alonna Buckler, RN Inpatient   07/07/2016 10:06 PM 07/07/2016 10:28 PM Full Code [id number]  Alonna Buckler, RN Inpatient   06/27/2016  7:03 AM 06/30/2016 10:27 AM Full Code [id number]  Radene Gunning, NP ED   07/26/2014  3:44 PM 07/29/2014  5:17 PM Full Code [id number]  Adrian Prows, MD Inpatient   03/13/2014  6:00 PM 03/16/2014  4:34 PM DNR [id number]  Adrian Prows, MD Inpatient   03/13/2014 11:42 AM 03/13/2014  6:00 PM DNR [id number]  Gerlene Fee, RN Inpatient   03/10/2014  8:24 PM 03/13/2014 11:42 AM Full Code [id number]  Etta Quill, DO ED   09/12/2013  3:40 AM 09/15/2013  5:32 PM Full Code [id number]  Rise Patience, MD Inpatient   07/12/2013  6:31 PM 07/14/2013  6:54 PM Full Code [id number]  Jonetta Osgood, MD ED   04/07/2013  9:33 AM 04/11/2013  3:49 PM Full Code [id number]  Geradine Girt, DO Inpatient   03/11/2013 10:07 AM 03/15/2013  2:58 PM Partial Code [id number]  Chesley Mires, MD Inpatient   03/10/2013  6:33 AM 03/11/2013 10:07 AM DNR [id number]  Lucious Groves, DO Inpatient   03/10/2013  2:48 AM 03/10/2013  6:33 AM DNR [id number]  Olga Millers, MD Inpatient  03/10/2013  2:15 AM 03/10/2013  2:48 AM Full Code [id number]  Lucious Groves, DO Inpatient   12/06/2012 10:37 PM 12/09/2012  6:28 PM Full Code [id number]  Rise Patience, MD Inpatient   11/24/2012  3:29 AM 12/02/2012  8:59 PM Full Code [id number]  Rise Patience, MD Inpatient   10/07/2012  3:30 AM 10/14/2012  6:47 PM Full Code [id number]  Etta Quill, DO Inpatient   09/10/2012  9:32 PM 09/27/2012 11:12 PM Full Code [id number]  Derrill Kay, MD Inpatient       Prognosis:   < 6 months in the setting of end stage CHF with EF 20%, worsening kidney function. Not a candidate for HD, inotropes, or other interventions  Discharge Planning:  [place] for rehab with Palliative care service follow-up: I agree that he would best be served by hospice, but he and his family have been unwilling to consider to this point.  Thank you for allowing the Palliative Medicine Team to assist in the care of this patient.   Time In: 0915 Time Out: 0940 Total Time 25 Prolonged Time Billed  no       Greater than 50%  of this time was  spent counseling and coordinating care related to the above assessment and plan.  Micheline Rough, MD  Please contact Palliative Medicine Team phone at [phone] for questions and concerns.

## 2016-07-14 NOTE — Progress Notes (Signed)
PT Cancellation Note  Patient Details Name: Caleb WANDREY MRN: [id number] DOB: Dec 20, 1950   Cancelled Treatment:    Reason Eval/Treat Not Completed: Other (comment) (refused due to imminent discharge)   Ramond Dial 07/14/2016, 2:33 PM   Mee Hives, PT MS Acute Rehab Dept. Number: [place] and [place]

## 2016-07-14 NOTE — Progress Notes (Signed)
Per PT, pt is immobile due to physical and cognitive issues. Will sign off. Yves Dill CES, ACSM 8:25 AM 07/14/2016

## 2016-07-14 NOTE — Progress Notes (Signed)
Clinical Social Worker facilitated patient discharge including contacting patient family and facility to confirm patient discharge plans.  Clinical information faxed to facility and family agreeable with plan.  CSW arranged ambulance transport via [facility] to [place] .  RN to call [phone] (pt will go in room 224) report prior to discharge.  Clinical Social Worker will sign off for now as social work intervention is no longer needed. Please consult [place] again if new need arises.  Rhea Pink, MSW, [place]

## 2016-07-14 NOTE — Care Management Important Message (Signed)
Important Message  Patient Details  Name: Caleb Bauer MRN: [id number] Date of Birth: September 14, 1950   Medicare Important Message Given:  Yes    Orbie Pyo 07/14/2016, 2:30 PM

## 2016-07-15 ENCOUNTER — Non-Acute Institutional Stay (SKILLED_NURSING_FACILITY): Payer: Medicare Other | Admitting: Internal Medicine

## 2016-07-15 ENCOUNTER — Encounter: Payer: Self-pay | Admitting: Internal Medicine

## 2016-07-15 DIAGNOSIS — I5043 Acute on chronic combined systolic (congestive) and diastolic (congestive) heart failure: Secondary | ICD-10-CM | POA: Diagnosis not present

## 2016-07-15 DIAGNOSIS — G4730 Sleep apnea, unspecified: Secondary | ICD-10-CM | POA: Diagnosis not present

## 2016-07-15 DIAGNOSIS — L89899 Pressure ulcer of other site, unspecified stage: Secondary | ICD-10-CM | POA: Diagnosis not present

## 2016-07-15 DIAGNOSIS — N170 Acute kidney failure with tubular necrosis: Secondary | ICD-10-CM

## 2016-07-15 DIAGNOSIS — N183 Chronic kidney disease, stage 3 (moderate): Secondary | ICD-10-CM

## 2016-07-15 LAB — BASIC METABOLIC PANEL: BUN: 124 mg/dL — AB (ref 4–21)

## 2016-07-15 LAB — CBC AND DIFFERENTIAL
HCT: 36 % — AB (ref 41–53)
Hemoglobin: 11.4 g/dL — AB (ref 13.5–17.5)
Platelets: 82 10*3/uL — AB (ref 150–399)
WBC: 4.7 10^3/mL

## 2016-07-15 NOTE — Progress Notes (Signed)
MRN: [id number]  Facility Location: [facility]  Room Number: 619   Code Status: DNR  PCP: [name], MD [address] [place]  [id number]   This is a comprehensive admission note to [facility] performed on this date less than 30 days from date of admission. Included are preadmission medical/surgical history;reconciled medication list; family history; social history and comprehensive review of systems.  Corrections and additions to the records were documented . Comprehensive physical exam was also performed. Additionally a clinical summary was entered for each active diagnosis pertinent to this admission in the Problem List to enhance continuity of care.   HPI: The patient was hospitalized 4/27-5/14/18 for progressive lower extremity edema, associated with blistering and weeping. Additionally he had intermittent chest pain/pressure, especially with ambulation. The patient had discontinued torsemide approximately 2 weeks prior to presentation.  The patient had a history of nonischemic dilated cardiomyopathy with an ejection fraction of 20%  in 2016. In the ED BNP was greater than 4500 & ECHO again showed ejection fraction 20% with moderate to severe mitral regurgitation and moderately dilated right ventricle. The patient was not felt to be a candidate for ICD or pacemaker due to history of noncompliance & advanced comorbidities. These include COPD, diabetes, hypertension,  and cor pulmonale. He was treated with aggressive diuresis with high-dose Lasix, metolazone, and milrinone  drip. Due to low CVP Lasix was discontinued.Nephrology felt he was not a candidate for hemodialysis and recommended palliative care. With aggressive diuresis patient had an I&O balance of -14.6 L and weight dropped from 227 pounds to 189 pounds at discharge. Patient declined hospice as recommended by palliative care and opted for SNFplacement. . On 5/11  the patient was found to be very confused. Urinalysis revealed leukocytosis, culture revealed multiple species. He did receive IV Rocephin and was transitioned to oral Keflex at discharge. Metabolic alkalosis was treated with Diamox short-term. Hemoglobin A1c was 6.2% which is prediabetic. He received sliding scale insulin in the hospital. He did exhibit atrial flutter for which he received heparin drip. This was discontinued because of thrombocytopenia. Eliquis was initiated for long-term prophylaxis. Low-dose colchicine was continued for chronic gouty arthritis. CPAP was to be continued for sleep apnea. A chronic full-thickness wound was present on the left posterior leg with superimposed cellulitis. Wound care provided recommendations Amiodarone was continued for ventricular tachycardia. 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 The patient also received prednisone 40 mg X  3 days. Thrombocytopenia was felt to be chronic , ? related to  liver disease. Platelet # was 92,000 at discharge. At discharge creatinine was 4.59 and BUN 126. There was minimal anemia with hemoglobin 11.2/hematocrit 34.7. Psychiatry did not feel that he has capacity to make his medical decisions. A niece, [name] has healthcare power of attorney.  Past medical and surgical history:Is long and complicated. He has had an incarcerated incisional hernia site repair with mesh. Also he has had recurrent small bowel obstruction.  Social history:He quit smoking 2014 and does not drink.  Family history:Reviewed  Review of systems: Responses were unreliable due to dementia. Date given as Tuesday, 28, 2021. Unable to name the president. He struggled to answer these questions and while doing so drummed his fingers against his forehead. He describes some sore throat. He also describes tremor of the hands. He has a history of seasonal allergies. He states he's been coughing with  brown sputum. He describes being "tense and nervous".  Constitutional: No fever,significant weight change, fatigue  Eyes: No redness, discharge, pain, vision change ENT/mouth: No nasal congestion,  purulent discharge, earache,change in hearing   Cardiovascular: No chest pain, palpitations,paroxysmal nocturnal dyspnea, claudication, edema  Respiratory: No hemoptysis, DOE , significant snoring,apnea   Gastrointestinal: No heartburn,dysphagia,abdominal pain, nausea / vomiting,rectal bleeding, melena,change in bowels Genitourinary: No dysuria,hematuria, pyuria,  incontinence, nocturia Musculoskeletal: No joint stiffness, joint swelling, weakness,pain Dermatologic: No rash, pruritus, change in appearance of skin Neurologic: No dizziness,headache,syncope, seizures, numbness , tingling Endocrine: No change in hair/skin/ nails, excessive thirst, excessive hunger, excessive urination  Hematologic/lymphatic: No significant bruising, lymphadenopathy,abnormal bleeding Allergy/immunology: No urticaria, angioedema  Physical exam:  Pertinent or positive findings: His hair is dyed blonde. He has a mustache and beard. The most striking physical finding are brightly painted  false fingernails with vibrant colors. Ptosis is present on the right. Maxilla  is edentulous. He has moderately severe gum disease of the mandible. Bronchovesicular breath sounds are suggested. He has irregular tachycardia. Posterior tibial pulses are decreased but dorsalis pedis pulses are strong. He has scarring over the shins. He has bilateral patellar effusions. There is relative central facial hypopigmentation with hyperpigmentation over the lateral face, particularly on the left.   General appearance:Adequately nourished; no acute distress , increased work of breathing is present.   Lymphatic: No lymphadenopathy about the head, neck, axilla . Eyes: No conjunctival inflammation or lid edema is present. There is no scleral  icterus. Ears:  External ear exam shows no significant lesions or deformities.   Nose:  External nasal examination shows no deformity or inflammation. Nasal mucosa are pink and moist without lesions ,exudates Oral exam: lips are healthy appearing.There is no oropharyngeal erythema or exudate . Neck:  No thyromegaly, masses, tenderness noted.    Lungs: without wheezes, rhonchi,rales , rubs. Abdomen:Bowel sounds are normal. Abdomen is soft and nontender with no organomegaly, hernias,masses. GU: deferred  Extremities:  No cyanosis, clubbing,edema  Neurologic exam : Balance,Rhomberg,finger to nose testing could not be completed due to clinical state Skin: Warm & dry w/o tenting. No significant rash.  See clinical summary under each active problem in the Problem List with associated updated therapeutic plan

## 2016-07-15 NOTE — Assessment & Plan Note (Signed)
He has end-stage renal disease and is not a hemodialysis candidate as per nephrology DNR

## 2016-07-15 NOTE — Assessment & Plan Note (Signed)
Wound care at SNF 

## 2016-07-15 NOTE — Assessment & Plan Note (Addendum)
Weight @ discharge 189 pounds, diuresis will be adjusted based on weight gain Torsemide to be restarted 07/15/2016

## 2016-07-16 NOTE — Assessment & Plan Note (Signed)
RT setting up CPAP

## 2016-07-16 NOTE — Patient Instructions (Signed)
See assessment and plan under each diagnosis in the problem list and acutely for this visit 

## 2016-07-21 ENCOUNTER — Other Ambulatory Visit (HOSPITAL_COMMUNITY): Payer: Self-pay | Admitting: Internal Medicine

## 2016-07-21 DIAGNOSIS — R1319 Other dysphagia: Secondary | ICD-10-CM

## 2016-07-22 ENCOUNTER — Encounter (HOSPITAL_COMMUNITY): Payer: Self-pay

## 2016-07-22 ENCOUNTER — Ambulatory Visit (HOSPITAL_COMMUNITY)
Admission: RE | Admit: 2016-07-22 | Discharge: 2016-07-22 | Disposition: A | Discharge status: Home / Self Care | Payer: PRIVATE HEALTH INSURANCE | Source: Ambulatory Visit | Attending: Internal Medicine | Admitting: Internal Medicine

## 2016-07-22 VITALS — BP 120/76 | HR 76

## 2016-07-22 DIAGNOSIS — Z794 Long term (current) use of insulin: Secondary | ICD-10-CM | POA: Diagnosis not present

## 2016-07-22 DIAGNOSIS — M1990 Unspecified osteoarthritis, unspecified site: Secondary | ICD-10-CM | POA: Insufficient documentation

## 2016-07-22 DIAGNOSIS — N183 Chronic kidney disease, stage 3 (moderate): Secondary | ICD-10-CM | POA: Insufficient documentation

## 2016-07-22 DIAGNOSIS — G4730 Sleep apnea, unspecified: Secondary | ICD-10-CM | POA: Diagnosis not present

## 2016-07-22 DIAGNOSIS — J449 Chronic obstructive pulmonary disease, unspecified: Secondary | ICD-10-CM | POA: Insufficient documentation

## 2016-07-22 DIAGNOSIS — Z8619 Personal history of other infectious and parasitic diseases: Secondary | ICD-10-CM | POA: Diagnosis not present

## 2016-07-22 DIAGNOSIS — E1122 Type 2 diabetes mellitus with diabetic chronic kidney disease: Secondary | ICD-10-CM | POA: Diagnosis not present

## 2016-07-22 DIAGNOSIS — D696 Thrombocytopenia, unspecified: Secondary | ICD-10-CM | POA: Insufficient documentation

## 2016-07-22 DIAGNOSIS — I130 Hypertensive heart and chronic kidney disease with heart failure and stage 1 through stage 4 chronic kidney disease, or unspecified chronic kidney disease: Secondary | ICD-10-CM | POA: Diagnosis not present

## 2016-07-22 DIAGNOSIS — I5023 Acute on chronic systolic (congestive) heart failure: Secondary | ICD-10-CM | POA: Diagnosis present

## 2016-07-22 DIAGNOSIS — I5043 Acute on chronic combined systolic (congestive) and diastolic (congestive) heart failure: Secondary | ICD-10-CM | POA: Diagnosis not present

## 2016-07-22 DIAGNOSIS — K219 Gastro-esophageal reflux disease without esophagitis: Secondary | ICD-10-CM | POA: Diagnosis not present

## 2016-07-22 DIAGNOSIS — I472 Ventricular tachycardia: Secondary | ICD-10-CM | POA: Insufficient documentation

## 2016-07-22 DIAGNOSIS — I4729 Other ventricular tachycardia: Secondary | ICD-10-CM

## 2016-07-22 DIAGNOSIS — N184 Chronic kidney disease, stage 4 (severe): Secondary | ICD-10-CM

## 2016-07-22 DIAGNOSIS — I480 Paroxysmal atrial fibrillation: Secondary | ICD-10-CM | POA: Diagnosis not present

## 2016-07-22 DIAGNOSIS — D691 Qualitative platelet defects: Secondary | ICD-10-CM | POA: Diagnosis not present

## 2016-07-22 DIAGNOSIS — E0580 Other thyrotoxicosis without thyrotoxic crisis or storm: Secondary | ICD-10-CM | POA: Insufficient documentation

## 2016-07-22 DIAGNOSIS — Z86010 Personal history of colonic polyps: Secondary | ICD-10-CM | POA: Insufficient documentation

## 2016-07-22 DIAGNOSIS — Z7902 Long term (current) use of antithrombotics/antiplatelets: Secondary | ICD-10-CM | POA: Insufficient documentation

## 2016-07-22 DIAGNOSIS — M109 Gout, unspecified: Secondary | ICD-10-CM | POA: Diagnosis not present

## 2016-07-22 DIAGNOSIS — Z87440 Personal history of urinary (tract) infections: Secondary | ICD-10-CM | POA: Diagnosis not present

## 2016-07-22 DIAGNOSIS — I4892 Unspecified atrial flutter: Secondary | ICD-10-CM | POA: Diagnosis not present

## 2016-07-22 DIAGNOSIS — I420 Dilated cardiomyopathy: Secondary | ICD-10-CM | POA: Insufficient documentation

## 2016-07-22 LAB — CBC
HCT: 38 % — ABNORMAL LOW (ref 39.0–52.0)
Hemoglobin: 12.1 g/dL — ABNORMAL LOW (ref 13.0–17.0)
MCH: 24.2 pg — ABNORMAL LOW (ref 26.0–34.0)
MCHC: 31.8 g/dL (ref 30.0–36.0)
MCV: 75.8 fL — ABNORMAL LOW (ref 78.0–100.0)
Platelets: DECREASED 10*3/uL (ref 150–400)
RBC: 5.01 MIL/uL (ref 4.22–5.81)
RDW: 19.9 % — AB (ref 11.5–15.5)
WBC: 4.1 10*3/uL (ref 4.0–10.5)

## 2016-07-22 LAB — BASIC METABOLIC PANEL
Anion gap: 18 — ABNORMAL HIGH (ref 5–15)
BUN: 158 mg/dL — AB (ref 6–20)
CALCIUM: 8.6 mg/dL — AB (ref 8.9–10.3)
CO2: 28 mmol/L (ref 22–32)
Chloride: 89 mmol/L — ABNORMAL LOW (ref 101–111)
Creatinine, Ser: 5 mg/dL — ABNORMAL HIGH (ref 0.61–1.24)
GFR calc Af Amer: 13 mL/min — ABNORMAL LOW (ref >60)
GFR, EST NON AFRICAN AMERICAN: 11 mL/min — AB (ref >60)
Glucose, Bld: 94 mg/dL (ref 65–99)
Potassium: 2.8 mmol/L — ABNORMAL LOW (ref 3.5–5.1)
Sodium: 135 mmol/L (ref 135–145)

## 2016-07-22 MED ORDER — CARVEDILOL 3.125 MG PO TABS: 3.1250 mg | ORAL_TABLET | Freq: Two times a day (BID) | ORAL | 11 refills | Status: AC

## 2016-07-22 NOTE — Progress Notes (Addendum)
Patient ID: Caleb Bauer, male   DOB: [birth year], 66 y.o.   MRN: [id number] Referring Physician: Einar Gip PCP:   HPI: Mr. Caleb Bauer is a 66 year old male with PMH of Systolic CHF (EF 25-05% in July 14 and 1/15), DM2, HTN, COPD (quit 6/14), paroxysmal atrial flutter on amiodarone, CKD Stage 3, remote h/o poylsubstance abuse referred by Dr. Einar Gip for help with management of his HF. He has no history of heavy ETOH use or cocaine abuse.   Was admitted in January 2015 with sepsis and multiorgan failure. Went to [facility]. Was readmitted in early February for HF. He says that he has not had a cath or a stress test. He was set up for CPX after last appointment but became lightheaded after doing PFTs and was found to have very low BP so did not complete CPX. He was taken off of most of his cardiac meds earlier this year due to symptomatic hypotension.   Admitted 06/27/16-07/14/16 with acute respiratory failure, volume overload. Echo showed 20% with moderately dilated RV, moderate to severe MR, TR. Started milrinone for low output HF, diuresed with high dose IV lasix and metolazone. Seen by renal and felt to be a poor HD candidate, he was sent to SNF with plans for Hospice consult. He was seen by psychiatry and was deemed not to have the capacity to make his own medical decisions. His niece, Caleb Bauer is his POA. Discharge weight 189 pounds.   He returns today for HF follow up. Residing at [facility]. He is participating in PT there and is able to walk with a walker short distances without SOB. No SOB at rest or with ADL's. His medications are administered by the facility. He is not weighing daily. His niece says that he is barely eating anything at the facility, drinking more than 2L a day. Denies SOB, orthopnea, PND.     ROS: All systems negative except as listed in HPI, PMH and Problem List.  Past Medical History:  Diagnosis Date  . Acute renal failure ([place]) 11/23/2012  . Arthritis   . Atrial flutter by  electrocardiogram ([place]) 11/2012  . Cardiomyopathy, dilated ([place])   . CHF (congestive heart failure) ([place])   . Chronic kidney disease   . COPD (chronic obstructive pulmonary disease) ([place])   . Diabetes mellitus without complication ([place])   . Encephalopathy acute 03/14/2013  . Enteritis due to Clostridium difficile 03/12/2014  . GERD (gastroesophageal reflux disease)    appt 5/17  . Gout flare 09/12/2013  . HCAP (healthcare-associated pneumonia) 03/24/2013   HCAP 03/09/13 >tx w/ abx  cXR 03/23/13 >improved but persistent bibasilar consolidation>cxr >   . Herpes    BUTT AND BACK- 9/23 HEALED  . Herpes zoster 04/18/2013  . Hx of colonic polyps    Remote hx 08/2015 - 5 mm adenoma recall 2022   . Hypertension   . Incarcerated incisional hernia 05/30/2015  . Partial small bowel obstruction s/p lap adhesiolysis 05/30/2015 05/30/2015  . Recurrent ventral hernia with incarceration s/p lap repair with mesh 05/30/2015 05/30/2015  . S/P laparoscopic hernia repair 05/30/2015  . Sepsis due to undetermined organism with acute respiratory failure ([place]) 03/10/2013  . Sleep apnea    never used cpap but has one  . Small bowel obstruction ([place]) 10/2012  . UTI (urinary tract infection) 09/15/2013    Current Outpatient Prescriptions  Medication Sig Dispense Refill  . albuterol (PROVENTIL) (2.5 MG/3ML) 0.083% nebulizer solution Take 2.5 mg by nebulization every 6 (six) hours  as needed for wheezing or shortness of breath.    Marland Kitchen amiodarone (PACERONE) 200 MG tablet Take 1 tablet (200 mg total) by mouth daily. 90 tablet 3  . apixaban (ELIQUIS) 5 MG TABS tablet Take 5 mg by mouth 2 (two) times daily.    . colchicine 0.6 MG tablet Take 0.5 tablets (0.3 mg total) by mouth daily.    Marland Kitchen guaiFENesin-dextromethorphan (ROBITUSSIN DM) 100-10 MG/5ML syrup Take 5 mLs by mouth every 4 (four) hours as needed for cough. 118 mL 0  . hydrALAZINE (APRESOLINE) 10 MG tablet Take 10 mg by mouth 3 (three) times daily.    . insulin aspart (NOVOLOG)  100 UNIT/ML injection Inject 0-9 Units into the skin 3 (three) times daily with meals. Sliding scale CBG 70 - 120: 0 units CBG 121 - 150: 1 unit,  CBG 151 - 200: 2 units,  CBG 201 - 250: 3 units,  CBG 251 - 300: 5 units,  CBG 301 - 350: 7 units,  CBG 351 - 400: 9 units   CBG > 400: 9 units and notify your MD 10 mL 11  . methimazole (TAPAZOLE) 10 MG tablet Take 1 tablet (10 mg total) by mouth 3 (three) times daily.    . pantoprazole (PROTONIX) 40 MG tablet Take 1 tablet (40 mg total) by mouth daily at 6 (six) AM.    . polyethylene glycol (MIRALAX / GLYCOLAX) packet Take 17 g by mouth daily. 14 each 0  . torsemide (DEMADEX) 20 MG tablet Take 2 tablets (40 mg total) by mouth daily.  0  . traMADol (ULTRAM) 50 MG tablet Take 1 tablet (50 mg total) by mouth every 8 (eight) hours as needed for severe pain. 10 tablet 0   No current facility-administered medications for this encounter.       Vitals:   07/22/16 1356  BP: 120/76  Pulse: 76  SpO2: 100%    PHYSICAL EXAM: General: Ill appearing male, NAD. Arrived in wheelchair. Niece present.  HEENT: normal  Neck: supple. evidence of previous tracheostomy. No JVD. Carotids 2+ bilat; no bruits. No lymphadenopathy or thryomegaly appreciated.  Cor: PMI nondisplaced. Regular rate & rhythm. No rubs, gallops or murmurs.  Lungs: clear  Abdomen: soft, nontender, nondistended. No hepatosplenomegaly. No bruits or masses. Good bowel sounds.  Extremities: no cyanosis, clubbing, rash. No peripheral edema.  Neuro: alert & oriented x 3, cranial nerves grossly intact. moves all 4 extremities w/o difficulty. Affect pleasant.   ASSESSMENT & PLAN: 1. Acute on chronic systolic CHF: Nonischemic cardiomyopathy.  Echo 4/18 with EF 20%, moderately dilated RV, moderate-severe MR and TR. - NYHA III - Volume status stable. Continue torsemide 40mg  daily.  - Start Coreg 3.125mg  BID.  - No ACE/ARB/ARNI with end stage CKD.  - Continue hydralazine 10mg  TID.  2.  Hyperthyroidism: New diagnosis. ? Related to amio.   - Continue methimazole as treatment of amiodarone-induced hyperthyroidism (now off prednisone). 3. NSVT:  - Continue Amio 200mg  daily.  - Hyperthyroidism treated with methimazole. Now off prednisone.  4. Atrial fibrillation:  Paroxysmal.  - Maintaining NSR.  - Continue Amio and Eliquis.   5. Thrombocytopenia:  - RUQ [place] --> no evidence of cirrhosis.  - CBC today.      6. CKD stage 3:  - Not a candidate for HD.  - He has declined Hospice/Palliative care.    CBC, BMET today. Follow up in 3 months.    Arbutus Leas 07/22/2016

## 2016-07-22 NOTE — Patient Instructions (Addendum)
START Coreg 3.125mg , one tab twice a day  Labs today We will only contact you if something comes back abnormal or we need to make some changes. Otherwise no news is good news!  Your physician recommends that you schedule a follow-up appointment in: 3 months with Dr Aundra Dubin   Do the following things EVERYDAY: 1) Weigh yourself in the morning before breakfast. Write it down and keep it in a log. 2) Take your medicines as prescribed 3) Eat low salt foods-Limit salt (sodium) to 2000 mg per day.  4) Stay as active as you can everyday 5) Limit all fluids for the day to less than 2 liters

## 2016-07-23 NOTE — Addendum Note (Signed)
Encounter addended by: Arbutus Leas, NP on: 07/23/2016  1:19 PM<BR>    Actions taken: Sign clinical note

## 2016-07-24 ENCOUNTER — Ambulatory Visit (HOSPITAL_COMMUNITY): Payer: Medicare Other

## 2016-07-25 LAB — BASIC METABOLIC PANEL
CREATININE: 4.9 mg/dL — AB (ref 0.6–1.3)
GLUCOSE: 74 mg/dL
POTASSIUM: 3.5 mmol/L (ref 3.4–5.3)
Sodium: 141 mmol/L (ref 137–147)

## 2016-07-30 ENCOUNTER — Other Ambulatory Visit (HOSPITAL_COMMUNITY): Payer: Medicare Other

## 2016-07-30 ENCOUNTER — Ambulatory Visit (HOSPITAL_COMMUNITY): Payer: Medicare Other

## 2016-08-01 DEATH — deceased

## 2016-08-20 ENCOUNTER — Encounter (INDEPENDENT_AMBULATORY_CARE_PROVIDER_SITE_OTHER): Payer: Medicare Other | Admitting: Podiatry

## 2016-08-20 NOTE — Progress Notes (Signed)
ERRONEOUS ENCOUNTER NO SHOW  

## 2016-10-22 ENCOUNTER — Encounter (HOSPITAL_COMMUNITY): Payer: Medicare Other | Admitting: Cardiology
